# Patient Record
Sex: Female | Born: 1940
Health system: Southern US, Community
[De-identification: ages and names within clinical notes are randomized; demographics above are authoritative.]

## PROBLEM LIST (undated history)

## (undated) DIAGNOSIS — I499 Cardiac arrhythmia, unspecified: Secondary | ICD-10-CM

## (undated) DIAGNOSIS — E785 Hyperlipidemia, unspecified: Secondary | ICD-10-CM

## (undated) DIAGNOSIS — R112 Nausea with vomiting, unspecified: Secondary | ICD-10-CM

## (undated) DIAGNOSIS — Z7901 Long term (current) use of anticoagulants: Secondary | ICD-10-CM

## (undated) DIAGNOSIS — Z950 Presence of cardiac pacemaker: Secondary | ICD-10-CM

## (undated) DIAGNOSIS — R0602 Shortness of breath: Secondary | ICD-10-CM

## (undated) DIAGNOSIS — H269 Unspecified cataract: Secondary | ICD-10-CM

## (undated) DIAGNOSIS — I484 Atypical atrial flutter: Secondary | ICD-10-CM

## (undated) DIAGNOSIS — I5032 Chronic diastolic (congestive) heart failure: Secondary | ICD-10-CM

## (undated) DIAGNOSIS — I10 Essential (primary) hypertension: Secondary | ICD-10-CM

## (undated) DIAGNOSIS — S62109A Fracture of unspecified carpal bone, unspecified wrist, initial encounter for closed fracture: Secondary | ICD-10-CM

## (undated) DIAGNOSIS — I471 Supraventricular tachycardia: Secondary | ICD-10-CM

## (undated) DIAGNOSIS — G20 Parkinson's disease: Secondary | ICD-10-CM

## (undated) DIAGNOSIS — D649 Anemia, unspecified: Secondary | ICD-10-CM

## (undated) DIAGNOSIS — K219 Gastro-esophageal reflux disease without esophagitis: Secondary | ICD-10-CM

## (undated) DIAGNOSIS — Z9889 Other specified postprocedural states: Secondary | ICD-10-CM

## (undated) DIAGNOSIS — I4819 Other persistent atrial fibrillation: Secondary | ICD-10-CM

## (undated) DIAGNOSIS — M810 Age-related osteoporosis without current pathological fracture: Secondary | ICD-10-CM

## (undated) DIAGNOSIS — I4719 Other supraventricular tachycardia: Secondary | ICD-10-CM

## (undated) DIAGNOSIS — M1990 Unspecified osteoarthritis, unspecified site: Secondary | ICD-10-CM

## (undated) DIAGNOSIS — E039 Hypothyroidism, unspecified: Secondary | ICD-10-CM

## (undated) DIAGNOSIS — I495 Sick sinus syndrome: Secondary | ICD-10-CM

## (undated) DIAGNOSIS — R011 Cardiac murmur, unspecified: Secondary | ICD-10-CM

## (undated) DIAGNOSIS — S8290XA Unspecified fracture of unspecified lower leg, initial encounter for closed fracture: Secondary | ICD-10-CM

## (undated) DIAGNOSIS — IMO0002 Reserved for concepts with insufficient information to code with codable children: Secondary | ICD-10-CM

## (undated) HISTORY — DX: Long term (current) use of anticoagulants: Z79.01

## (undated) HISTORY — DX: Age-related osteoporosis without current pathological fracture: M81.0

## (undated) HISTORY — DX: Fracture of unspecified carpal bone, unspecified wrist, initial encounter for closed fracture: S62.109A

## (undated) HISTORY — PX: CARDIAC ELECTROPHYSIOLOGY STUDY AND ABLATION: SHX1294

## (undated) HISTORY — PX: TONSILLECTOMY: SUR1361

## (undated) HISTORY — DX: Parkinson's disease: G20

## (undated) HISTORY — PX: WRIST SURGERY: SHX841

## (undated) HISTORY — DX: Sick sinus syndrome: I49.5

## (undated) HISTORY — DX: Hyperlipidemia, unspecified: E78.5

## (undated) HISTORY — DX: Chronic diastolic (congestive) heart failure: I50.32

## (undated) HISTORY — DX: Other persistent atrial fibrillation: I48.19

## (undated) HISTORY — PX: SKIN CANCER DESTRUCTION: SHX778

## (undated) HISTORY — DX: Atypical atrial flutter: I48.4

## (undated) HISTORY — PX: BREAST SURGERY: SHX581

## (undated) HISTORY — DX: Unspecified fracture of unspecified lower leg, initial encounter for closed fracture: S82.90XA

## (undated) HISTORY — DX: Other supraventricular tachycardia: I47.19

## (undated) HISTORY — DX: Supraventricular tachycardia: I47.1

## (undated) HISTORY — DX: Essential (primary) hypertension: I10

## (undated) HISTORY — PX: OTHER SURGICAL HISTORY: SHX169

---

## 1993-02-03 HISTORY — PX: CHOLECYSTECTOMY: SHX55

## 1996-02-04 HISTORY — PX: BREAST BIOPSY: SHX20

## 1998-11-30 ENCOUNTER — Encounter: Admission: RE | Admit: 1998-11-30 | Discharge: 1998-11-30 | Payer: Self-pay | Admitting: Obstetrics and Gynecology

## 1998-11-30 ENCOUNTER — Encounter: Payer: Self-pay | Admitting: Obstetrics and Gynecology

## 1999-02-04 HISTORY — PX: KNEE SURGERY: SHX244

## 1999-08-12 ENCOUNTER — Encounter: Admission: RE | Admit: 1999-08-12 | Discharge: 1999-08-12 | Payer: Self-pay | Admitting: Obstetrics and Gynecology

## 1999-08-12 ENCOUNTER — Encounter: Payer: Self-pay | Admitting: Obstetrics and Gynecology

## 2000-06-09 ENCOUNTER — Ambulatory Visit (HOSPITAL_COMMUNITY): Admission: RE | Admit: 2000-06-09 | Discharge: 2000-06-09 | Payer: Self-pay | Admitting: Obstetrics and Gynecology

## 2000-06-17 ENCOUNTER — Ambulatory Visit (HOSPITAL_COMMUNITY): Admission: RE | Admit: 2000-06-17 | Discharge: 2000-06-17 | Payer: Self-pay | Admitting: Gastroenterology

## 2000-07-30 ENCOUNTER — Encounter: Payer: Self-pay | Admitting: Obstetrics and Gynecology

## 2000-07-30 ENCOUNTER — Encounter: Admission: RE | Admit: 2000-07-30 | Discharge: 2000-07-30 | Payer: Self-pay | Admitting: Obstetrics and Gynecology

## 2001-07-27 ENCOUNTER — Encounter: Admission: RE | Admit: 2001-07-27 | Discharge: 2001-07-27 | Payer: Self-pay | Admitting: Obstetrics and Gynecology

## 2001-07-27 ENCOUNTER — Encounter: Payer: Self-pay | Admitting: Obstetrics and Gynecology

## 2001-10-19 ENCOUNTER — Other Ambulatory Visit: Admission: RE | Admit: 2001-10-19 | Discharge: 2001-10-19 | Payer: Self-pay | Admitting: Obstetrics and Gynecology

## 2001-11-08 ENCOUNTER — Encounter: Payer: Self-pay | Admitting: Obstetrics and Gynecology

## 2001-11-08 ENCOUNTER — Encounter: Admission: RE | Admit: 2001-11-08 | Discharge: 2001-11-08 | Payer: Self-pay | Admitting: Physical Therapy

## 2001-12-15 ENCOUNTER — Ambulatory Visit (HOSPITAL_COMMUNITY): Admission: RE | Admit: 2001-12-15 | Discharge: 2001-12-15 | Payer: Self-pay | Admitting: Internal Medicine

## 2002-08-11 ENCOUNTER — Encounter: Admission: RE | Admit: 2002-08-11 | Discharge: 2002-08-11 | Payer: Self-pay | Admitting: Obstetrics and Gynecology

## 2002-08-11 ENCOUNTER — Encounter: Payer: Self-pay | Admitting: Obstetrics and Gynecology

## 2002-10-25 ENCOUNTER — Other Ambulatory Visit: Admission: RE | Admit: 2002-10-25 | Discharge: 2002-10-25 | Payer: Self-pay | Admitting: Obstetrics and Gynecology

## 2002-12-19 ENCOUNTER — Encounter: Admission: RE | Admit: 2002-12-19 | Discharge: 2002-12-19 | Payer: Self-pay | Admitting: Internal Medicine

## 2003-08-15 ENCOUNTER — Encounter: Admission: RE | Admit: 2003-08-15 | Discharge: 2003-08-15 | Payer: Self-pay | Admitting: Internal Medicine

## 2003-12-06 ENCOUNTER — Other Ambulatory Visit: Admission: RE | Admit: 2003-12-06 | Discharge: 2003-12-06 | Payer: Self-pay | Admitting: Obstetrics and Gynecology

## 2004-03-05 ENCOUNTER — Inpatient Hospital Stay (HOSPITAL_COMMUNITY): Admission: AD | Admit: 2004-03-05 | Discharge: 2004-03-07 | Payer: Self-pay | Admitting: Internal Medicine

## 2004-04-26 ENCOUNTER — Ambulatory Visit (HOSPITAL_COMMUNITY): Admission: RE | Admit: 2004-04-26 | Discharge: 2004-04-26 | Payer: Self-pay | Admitting: Cardiovascular Disease

## 2004-05-08 ENCOUNTER — Emergency Department (HOSPITAL_COMMUNITY): Admission: EM | Admit: 2004-05-08 | Discharge: 2004-05-08 | Payer: Self-pay | Admitting: Emergency Medicine

## 2004-05-10 ENCOUNTER — Ambulatory Visit (HOSPITAL_COMMUNITY): Admission: RE | Admit: 2004-05-10 | Discharge: 2004-05-10 | Payer: Self-pay | Admitting: Cardiovascular Disease

## 2004-09-02 ENCOUNTER — Encounter: Admission: RE | Admit: 2004-09-02 | Discharge: 2004-09-02 | Payer: Self-pay | Admitting: Obstetrics and Gynecology

## 2004-12-16 ENCOUNTER — Other Ambulatory Visit: Admission: RE | Admit: 2004-12-16 | Discharge: 2004-12-16 | Payer: Self-pay | Admitting: Obstetrics and Gynecology

## 2005-06-01 ENCOUNTER — Encounter: Admission: RE | Admit: 2005-06-01 | Discharge: 2005-06-01 | Payer: Self-pay | Admitting: Orthopedic Surgery

## 2005-06-03 ENCOUNTER — Encounter: Admission: RE | Admit: 2005-06-03 | Discharge: 2005-06-03 | Payer: Self-pay | Admitting: Orthopedic Surgery

## 2005-09-24 ENCOUNTER — Encounter: Admission: RE | Admit: 2005-09-24 | Discharge: 2005-09-24 | Payer: Self-pay | Admitting: Obstetrics and Gynecology

## 2006-01-06 ENCOUNTER — Encounter: Admission: RE | Admit: 2006-01-06 | Discharge: 2006-01-06 | Payer: Self-pay | Admitting: Obstetrics and Gynecology

## 2006-02-23 ENCOUNTER — Other Ambulatory Visit: Admission: RE | Admit: 2006-02-23 | Discharge: 2006-02-23 | Payer: Self-pay | Admitting: Obstetrics and Gynecology

## 2006-10-08 ENCOUNTER — Encounter: Admission: RE | Admit: 2006-10-08 | Discharge: 2006-10-08 | Payer: Self-pay | Admitting: Obstetrics and Gynecology

## 2007-03-17 ENCOUNTER — Other Ambulatory Visit: Admission: RE | Admit: 2007-03-17 | Discharge: 2007-03-17 | Payer: Self-pay | Admitting: Obstetrics and Gynecology

## 2007-05-20 ENCOUNTER — Inpatient Hospital Stay (HOSPITAL_COMMUNITY): Admission: EM | Admit: 2007-05-20 | Discharge: 2007-05-21 | Payer: Self-pay | Admitting: Emergency Medicine

## 2007-05-21 ENCOUNTER — Encounter (INDEPENDENT_AMBULATORY_CARE_PROVIDER_SITE_OTHER): Payer: Self-pay | Admitting: Neurology

## 2007-06-04 LAB — HM COLONOSCOPY: HM Colonoscopy: NORMAL

## 2007-07-05 DIAGNOSIS — M810 Age-related osteoporosis without current pathological fracture: Secondary | ICD-10-CM

## 2007-07-05 HISTORY — DX: Age-related osteoporosis without current pathological fracture: M81.0

## 2007-09-02 ENCOUNTER — Encounter: Admission: RE | Admit: 2007-09-02 | Discharge: 2007-09-02 | Payer: Self-pay | Admitting: Orthopedic Surgery

## 2007-10-12 ENCOUNTER — Encounter: Admission: RE | Admit: 2007-10-12 | Discharge: 2007-10-12 | Payer: Self-pay | Admitting: Obstetrics and Gynecology

## 2008-02-04 HISTORY — PX: FOREARM SURGERY: SHX651

## 2008-03-10 HISTORY — PX: US ECHOCARDIOGRAPHY: HXRAD669

## 2008-04-18 ENCOUNTER — Other Ambulatory Visit: Admission: RE | Admit: 2008-04-18 | Discharge: 2008-04-18 | Payer: Self-pay | Admitting: Obstetrics and Gynecology

## 2008-05-22 ENCOUNTER — Ambulatory Visit (HOSPITAL_COMMUNITY): Admission: RE | Admit: 2008-05-22 | Discharge: 2008-05-22 | Payer: Self-pay | Admitting: Cardiovascular Disease

## 2008-06-06 ENCOUNTER — Encounter: Payer: Self-pay | Admitting: Cardiovascular Disease

## 2008-06-06 HISTORY — PX: CARDIOVASCULAR STRESS TEST: SHX262

## 2008-06-20 ENCOUNTER — Encounter: Admission: RE | Admit: 2008-06-20 | Discharge: 2008-06-20 | Payer: Self-pay | Admitting: Internal Medicine

## 2008-07-05 ENCOUNTER — Encounter: Admission: RE | Admit: 2008-07-05 | Discharge: 2008-07-05 | Payer: Self-pay | Admitting: Internal Medicine

## 2008-08-30 ENCOUNTER — Ambulatory Visit (HOSPITAL_COMMUNITY): Admission: RE | Admit: 2008-08-30 | Discharge: 2008-08-31 | Payer: Self-pay | Admitting: Orthopedic Surgery

## 2008-10-17 ENCOUNTER — Encounter: Admission: RE | Admit: 2008-10-17 | Discharge: 2008-10-17 | Payer: Self-pay | Admitting: Obstetrics and Gynecology

## 2008-11-01 ENCOUNTER — Ambulatory Visit (HOSPITAL_COMMUNITY): Admission: RE | Admit: 2008-11-01 | Discharge: 2008-11-01 | Payer: Self-pay | Admitting: Internal Medicine

## 2008-11-08 ENCOUNTER — Ambulatory Visit (HOSPITAL_COMMUNITY): Admission: RE | Admit: 2008-11-08 | Discharge: 2008-11-08 | Payer: Self-pay | Admitting: Cardiovascular Disease

## 2008-11-15 ENCOUNTER — Ambulatory Visit (HOSPITAL_COMMUNITY): Admission: RE | Admit: 2008-11-15 | Discharge: 2008-11-15 | Payer: Self-pay | Admitting: Internal Medicine

## 2009-02-03 DIAGNOSIS — G20 Parkinson's disease: Secondary | ICD-10-CM

## 2009-02-03 DIAGNOSIS — G20A1 Parkinson's disease without dyskinesia, without mention of fluctuations: Secondary | ICD-10-CM

## 2009-02-03 HISTORY — DX: Parkinson's disease without dyskinesia, without mention of fluctuations: G20.A1

## 2009-02-03 HISTORY — PX: FEMUR FRACTURE SURGERY: SHX633

## 2009-02-03 HISTORY — DX: Parkinson's disease: G20

## 2009-04-25 LAB — HM PAP SMEAR: HM Pap smear: NEGATIVE

## 2009-10-22 ENCOUNTER — Encounter: Admission: RE | Admit: 2009-10-22 | Discharge: 2009-10-22 | Payer: Self-pay | Admitting: Obstetrics and Gynecology

## 2010-01-23 ENCOUNTER — Ambulatory Visit: Payer: Self-pay | Admitting: Cardiovascular Disease

## 2010-01-23 DIAGNOSIS — S8290XA Unspecified fracture of unspecified lower leg, initial encounter for closed fracture: Secondary | ICD-10-CM

## 2010-01-23 HISTORY — DX: Unspecified fracture of unspecified lower leg, initial encounter for closed fracture: S82.90XA

## 2010-05-09 LAB — PROTIME-INR: Prothrombin Time: 37 seconds — ABNORMAL HIGH (ref 11.6–15.2)

## 2010-05-12 LAB — BASIC METABOLIC PANEL WITH GFR
BUN: 17 mg/dL (ref 6–23)
CO2: 27 meq/L (ref 19–32)
Calcium: 8.6 mg/dL (ref 8.4–10.5)
Chloride: 101 meq/L (ref 96–112)
Creatinine, Ser: 0.71 mg/dL (ref 0.4–1.2)
GFR calc Af Amer: >60 mL/min (ref >60)
GFR calc non Af Amer: >60 mL/min (ref >60)
Glucose, Bld: 119 mg/dL — ABNORMAL HIGH (ref 70–99)
Potassium: 4 meq/L (ref 3.5–5.1)
Sodium: 135 meq/L (ref 135–145)

## 2010-05-12 LAB — CBC
MCHC: 34.3 g/dL (ref 30.0–36.0)
MCV: 90.8 fL (ref 78.0–100.0)
RBC: 3.48 MIL/uL — ABNORMAL LOW (ref 3.87–5.11)
WBC: 6.3 10*3/uL (ref 4.0–10.5)

## 2010-05-12 LAB — APTT: aPTT: 51 seconds — ABNORMAL HIGH (ref 24–37)

## 2010-05-12 LAB — PROTIME-INR
INR: 3.2 — ABNORMAL HIGH (ref 0.00–1.49)
Prothrombin Time: 35.7 seconds — ABNORMAL HIGH (ref 11.6–15.2)

## 2010-06-18 NOTE — H&P (Signed)
NAME:  [name] [name], [name] [name]             ACCOUNT NO.:  [id number]   MEDICAL RECORD NO.:  [id number]            PATIENT TYPE:   LOCATION:                                 FACILITY:   PHYSICIAN:  Vesta Mixer, M.D.      DATE OF BIRTH:   DATE OF ADMISSION:  DATE OF DISCHARGE:                              HISTORY & PHYSICAL   [name] [name] is an elderly female with a history of atrial fibrillation,  hypertension, and hyperlipidemia.  She is admitted now for elective  cardioversion for atrial fibrillation.   [name] [name] is an elderly female with a history of intermittent atrial  fibrillation.  She has been cardioverted in the past.  She now presents  with several months of persistent cardioversion.  She has been on  Coumadin for the past several weeks and has been therapeutic.  She  notices shortness of breath especially with exertion since she has been  in atrial fibrillation.  She has not had any syncope or chest pain.  She  denies any PND or orthopnea.  She just notices some dyspnea on exertion.   She denies any cough or cold symptoms.   CURRENT MEDICATIONS:  1. Multivitamin once a day.  2. Cozaar 25 mg a day.  3. Toprol-XL 50 mg a day.  4. Calcium twice a day.  5. Magnesium twice a day.  6. Selenium twice a day.  7. Lutein once a day.  8. Aspirin 81 mg a day.  9. Coumadin 5 mg 3 days a week, 2.5 mg 4 days a week.  10.Lipitor 5 mg a day.  11.Rozerem as needed.  12.Actonel once a month.   ALLERGIES:  None.   PAST MEDICAL HISTORY:  1. Atrial fibrillation.  2. Hypertension.  3. Hyperlipidemia.   SOCIAL HISTORY:  The patient is a nonsmoker.  She drinks alcohol  occasionally.  She walks several times a week.   FAMILY HISTORY:  Her father died at age 45 due to complications  involving rheumatic fever.  Her mother died at age 54 due to Alzheimer  disease.  Her review of systems is reviewed in the HPI.  All other  systems were reviewed and are negative.   On exam, she is an  elderly female in no acute distress.  She is alert  and oriented x3.  Her mood and affect are normal.  Her weight is 150,  blood pressure is 100/70 with a heart rate of 72.  HEENT exam reveals 2+  carotids.  She has no bruits, no JVD.  Her neck is supple.  Lungs are  clear.  Her back is nontender.  Her heart is irregularly irregular.  Her  PMI is nondisplaced.  She has a soft systolic murmur.  Her chest wall is  nontender.  Her abdominal exam reveals good bowel sounds.  She has no  hepatosplenomegaly.  She has no masses or bruits.  Her extremities show  no clubbing, cyanosis, or edema.  Her skin is warm and dry.  Her gait is  normal.  Neuro exam reveals cranial nerves II through XII are intact  and  her motor and sensory functions are intact.   Her EKG from last month reveals atrial fibrillation with a controlled  ventricular response.   I had a long discussion with [name] regarding cardioversion.  At this  point, I do not think that we need to consider antiarrhythmic.  Let us  admit her to the hospital for elective cardioversion.  We can consider  doing a stress test to make sure that she does not have any ischemia and  we can consider flecainide.  I have told her that all of the  antiarrhythmics would potentially have side effects but none of them  worked all that well.  We also discussed the possibility of that she  could enter one of the [employer] studies for a new  antiarrhythmic drug for atrial fibrillation.  We will talk more about  that at our next meeting.  We have discussed the risks, benefits, and  options of cardioversion.  She understands and agrees to proceed.      Vesta Mixer, M.D.  Electronically Signed     PJN/MEDQ  D:  04/10/2008  T:  04/11/2008  Job:  [id number]   cc:   Loraine Leriche A. [name], M.D.

## 2010-06-18 NOTE — Consult Note (Signed)
NAME:  Kingdon, Nishi             ACCOUNT NO.:  [id number]   MEDICAL RECORD NO.:  [id number]          PATIENT TYPE:  INP   LOCATION:  1828                         FACILITY:  [facility]   PHYSICIAN:  Pramod P. Pearlean Brownie, MD    DATE OF BIRTH:  05/30/1940   DATE OF CONSULTATION:  05/20/2007  DATE OF DISCHARGE:                                 CONSULTATION   REFERRING PHYSICIAN:  Tera Mater. Evlyn Kanner, M.D.   REASON FOR REFERRAL:  TIA.   HISTORY OF PRESENT ILLNESS:  [name] is a 71 year old Caucasian  lady, who developed some confusion and right-sided weakness this  morning.  The patient is unable to give a clear history.  She states she  woke up in the morning and seemed like she was okay, but when her  daughter called her at 9:15 and spoke to her, the daughter states that  she found her mother to be still quite sleepy and groggy, slow to  process information, and the mother told her that she was still wearing  a nightgown, and she could not stand straight, she was leaning on the  right.  The patient told me that she noticed that she had some trouble  walking and placing her right leg.  She denies any headache or slurred  speech.  She did not fall down.  She has recovered completely after she  has come to the emergency room.  She has no known prior history of  stroke, TIAs, or seizures.  She has a history of paroxysmal atrial  fibrillation, but has been on aspirin, and she had undergone  cardioversion in the past and was on Coumadin prior to cardioversion.  She does complain of some palpitation off and on, but has not been known  to be in documented atrial fibrillation again.   PAST MEDICAL HISTORY:  Significant for,  1. Benign essential tremor for which she sees Dr. Sandria Manly.  2. Also, history of gastroesophageal reflux disease.  3. Hyperlipidemia.  4. Hypertension.  5. Osteopenia.   HOME MEDICATIONS:  1. Fosamax.  2. Vitamins.  3. Glucosamine.  4. Ibuprofen.  5. Nexium.  6. Cozaar.  7. Multivitamin.  8. Lutein.  9. Fish oil.  10.Ambien.   MEDICATIONS TO ALLERGIES:  None known.   FAMILY HISTORY:  Positive for Alzheimer's in mother.   SOCIAL HISTORY:  She lives alone, does not smoke.  She has 1-2 drinks  every day.  She is retired.   REVIEW OF SYSTEMS:  Negative for chest pain, fever, cough, shortness of  breath, diarrhea, or other illness.   PHYSICAL EXAMINATION:  GENERAL:  Physical exam reveals a pleasant,  elderly, Caucasian lady not in distress, afebrile.  VITAL SIGNS:  Pulse rate is at 78 per minute and regular, some ectopic  beats are noted.  Blood pressure is 122/76, temperature is 97.8,  respiratory rate is 16 per minute, and sats are 97% on room air.  ENT:  Unremarkable.  No carotid bruits are heard.  LUNGS:  Clear to auscultation.  ABDOMEN:  Soft and nontender.  CARDIAC:  Regular heart sounds.  No murmur.  NECK:  Supple.  There is no bruit.  NEUROLOGICAL:  She is pleasant, awake, alert, and cooperative.  There is  no aphasia, apraxia, or dysarthria.  Pupils are equally reactive.  Eye  movements are full range, there is no nystagmus.  Face is symmetric.  Palatal movements are normal.  Tongue is midline.  Motor system exam  reveals normal upper extremity grip, symmetric strength, tone, reflexes,  coordination, and sensation.  She has slightly diminished fine finger  movements in the right.  She orbits the left or right upper extremity.  There is mild weakness of right grip compared to the left.  Coordination  is intact.  Sensation is normal.  Plantars are downgoing.   LABORATORY DATA:  Data reviewed, noncontrast CAT scan of the head done  today reveals no acute abnormality.  CBC, basic metabolic panel labs,  and UA are all normal.   IMPRESSION:  This is a 70 year old lady with transient episode of  confusion and right-sided weakness, likely due to small left hemispheric  subcortical infarct or transient ischemic attack.  She has history of   paroxysmal atrial fibrillation, and hence, cardioembolic etiology is  quite likely.   PLAN:  The patient will likely need anticoagulation with Coumadin if it  is okay with cardiologist, Dr. Elease Hashimoto.  Check MRI scan of the brain with  MRA of the brain, carotid ultrasound, fasting lipid profile, hemoglobin  A1c, and homocysteine.  Telemetric monitoring for atrial fibrillation.  I had a long discussion with the patient and her daughter regarding her  symptoms, discussed plan for evaluation and treatment, and answered  questions.  Kindly call for questions.           ______________________________  Sunny Schlein. Pearlean Brownie, MD     PPS/MEDQ  D:  05/20/2007  T:  05/21/2007  Job:  [id number]

## 2010-06-18 NOTE — H&P (Signed)
NAME:  [name] [name], [name] [name]             ACCOUNT NO.:  [id number]   MEDICAL RECORD NO.:  [id number]          PATIENT TYPE:  OIB   LOCATION:                               FACILITY:  [facility]   PHYSICIAN:  Vesta Mixer, M.D. DATE OF BIRTH:  1940-04-03   DATE OF ADMISSION:  05/22/2008  DATE OF DISCHARGE:                              HISTORY & PHYSICAL   [name] [name] is an elderly female with a history of hypertension,  atrial fibrillation, hyperlipidemia.  She is admitted now for elective  cardioversion.   [name] [name] was originally scheduled for admission on April 10, 2008.  It was  found at that time that her INR was subtherapeutic and cancelled the  admission.  She has been therapeutic on her Coumadin for the past month,  and she is now rescheduled for repeat attempt at cardioversion.  Please  see the dictated H and P from April 10, 2008.   [name] [name] is an elderly female with a history of intermittent atrial  fibrillation.  She has been cardioverted in the past.  She has had  persistent atrial fibrillation.  She had been on Coumadin and it has  been therapeutic.  She has had some shortness of breath with exertion,  but denies any PND, orthopnea.  She denies any chest pain or syncope or  presyncope.  She denies any heat or cold tolerance, weight gain or  weight loss.  All other systems were reviewed and are negative.   CURRENT MEDICATIONS:  1. Multivitamins once a day.  2. Cozaar 25 mg a day.  3. Toprol-XL 50 mg a day.  4. Calcium twice a day.  5. Magnesium twice a day.  6. Selenium once a day.  7. Lutein once a day.  8. Aspirin 81 mg a day.  9. Coumadin 5 mg 4 day a week and 2.5 mg 3 days a week.  10.Lipitor 5 mg a day.  11.Actonel once a month.   ALLERGIES:  None.   PAST MEDICAL HISTORY:  1. Atrial fibrillation.  2. Hypertension.  3. Heart murmurs.   SOCIAL HISTORY:  The patient is a nonsmoker.  She drinks alcohol  occasionally.   FAMILY HISTORY:  Her father died at age 44 due to  complications  including rheumatic fever.  Her mother died at age 102 due to Alzheimer  disease.   REVIEW OF SYSTEMS:  Review of systems is reviewed in the HPI.  All other  systems are negative.   PHYSICAL EXAMINATION:  GENERAL:  She is an elderly female in no acute  distress.  She is alert and oriented x3.  Mood and affect are normal.  VITAL SIGNS:  Weight is 148, blood pressure is 122/70 with a heart rate  of 72.  HEENT:  Carotids 2+.  She has no bruits.  Her sclerae are nonicteric.  Her mucous membranes are moist.  There is no JVD.  NECK:  Supple.  LUNGS:  Clear.  BACK:  Nontender.  HEART:  Irregularly irregular.  She has a soft systolic murmur.  ABDOMINAL:  Good bowel sounds.  There is no hepatosplenomegaly, no  guarding,  or rebound.  EXTREMITIES:  She has no clubbing, cyanosis, or edema.  SKIN:  Warm and dry without rashes.  Gait is normal.  NEURO:  Cranial nerves II through XII are intact and motor and sensory  functions are intact.   Her EKG reveals atrial fibrillation with a controlled ventricular  response.   LABORATORY DATA:  Acceptable.  Her INR has been above 2.0 since April 12, 2008.   [name] [name] presents now for elective cardioversion.  We have discussed the  risks, benefits, and options of cardioversion.  She understands and  agrees to proceed.      Vesta Mixer, M.D.  Electronically Signed     PJN/MEDQ  D:  05/16/2008  T:  05/17/2008  Job:  [id number]   cc:   Loraine Leriche A. [name], M.D.

## 2010-06-18 NOTE — Consult Note (Signed)
NAME:  [name] [name], [name] [name]             ACCOUNT NO.:  [id number]   MEDICAL RECORD NO.:  [id number]          PATIENT TYPE:  INP   LOCATION:  3040                         FACILITY:  [facility]   PHYSICIAN:  Peter M. [place], M.D.  DATE OF BIRTH:  September 23, 1940   DATE OF CONSULTATION:  DATE OF DISCHARGE:                                 CONSULTATION   HISTORY OF PRESENT ILLNESS:  Ms. Sprinkles is a very pleasant 70-year-  old white female with history of recurrent atrial fibrillation.  She has  had 2 prior cardioversions the last 3 to 4 years ago.  She has had  intermittent tachy palpitations, which have been ascribed to PACs, and  there has not been any documented recurrent atrial fibrillation.  She  does have a history of hypertension.  Today, she presented with slowness  of speech, jumbled thinking and an unsteady gait.  It is felt that these  symptoms are consistent with TIA.  She did have an echocardiogram 2  months ago without acute findings, and she currently feels fine.   PAST MEDICAL HISTORY:  1. Hypertension.  2. Atrial fibrillation.  3. Osteoporosis.  4. History of colon polyps.  5. Status post left knee surgery.  6. Vitamin D deficiency.  7. Status post cholecystectomy.   MEDICATIONS:  Include:  1. Multivitamin daily.  2. Nexium 40 mg per day.  3. Cozaar 25 mg per day.  4. Toprol XL 50 mg per day.  5. Calcium and magnesium supplements daily.  6. Lutein daily.  7. Aspirin 81 mg per day.  8. Lipitor 5 mg per day.   SOCIAL HISTORY:  The patient is nonsmoker, nondrinker.   FAMILY HISTORY:  Father died age 44 of rheumatic heart disease.  Mother  died age 5 with Alzheimer's disease.   REVIEW OF SYSTEMS:  Otherwise negative.   PHYSICAL EXAMINATION:  GENERAL:  Patient is a pleasant white female in  no distress.  VITAL SIGNS:  Blood pressure 138/79, pulse 62 and regular.  She is  afebrile.  HEENT:  Exam is unremarkable.  She has no bruits or JVD.  LUNGS:  Clear.  CARDIAC:  Exam  reveals irregular rate and rhythm without gallop, murmur  or click.  ABDOMEN:  Soft, nontender.  She has no edema.   LABORATORY DATA:  CT of the head shows no acute abnormality.  White  count is 4,600, hemoglobin 12.7, hematocrit 37.3, platelets 207,000.  UA  is negative, sodium is 138, potassium 4.3, chloride 105, CO2 20, BUN 20,  creatinine 1.1, glucose of 106.   IMPRESSION:  1. Transient ischemic attack.  2. History of atrial fibrillation, currently in sinus rhythm.  3. Hypertension.  4. Status post cholecystectomy.   PLAN:  Given her risk factors of female sex, history of atrial  fibrillation and history of hypertension, I think she merits long-term  anticoagulation with Coumadin, would continue on her Cozaar and Toprol.  I would not repeat her echocardiogram at this time, since she just had  one 2 months ago.  She is scheduled for a carotid ultrasound and MRI.  Please let us know if  we can be of further assistance.           ______________________________  Peter M. [place], M.D.     PMJ/MEDQ  D:  05/20/2007  T:  05/20/2007  Job:  [id number]   cc:   Jeannett Senior A. Evlyn Kanner, M.D.  Vesta Mixer, M.D.  Pramod P. Pearlean Brownie, MD

## 2010-06-18 NOTE — Op Note (Signed)
NAME:  [name] [name], [name] [name]             ACCOUNT NO.:  [id number]   MEDICAL RECORD NO.:  [id number]          PATIENT TYPE:  OIB   LOCATION:  5039                         FACILITY:  [facility]   PHYSICIAN:  Madelynn Done, MD  DATE OF BIRTH:  09-22-1940   DATE OF PROCEDURE:  08/30/2008  DATE OF DISCHARGE:                               OPERATIVE REPORT   PREOPERATIVE DIAGNOSES:  1. Left forearm retained hematomas, left forearm laterally and left      forearm medially.  2. Anticoagulation therapy.   POSTOPERATIVE DIAGNOSES:  1. Left forearm retained hematomas, left forearm laterally and left      forearm medially.  2. Anticoagulation therapy.   ATTENDING PHYSICIAN:  Madelynn Done, MD who scrubbed and present for  the entire procedure.   SURGICAL PROCEDURES:  1. Left forearm hematoma evacuation, lateral.  2. Left forearm hematoma evacuation, medially.   ANESTHESIA:  General via LMA.   DRAINS:  One TLS #7 drain and one vessel loop, TLS drain laterally and  vessel loop medially.   SURGICAL INDICATIONS:  Ms. Labarre is a 70 year old right-hand-dominant  female who was on anticoagulation therapy sustained a crushing injury to  her left forearm.  The patient had worsening swelling in her left  forearm.  She presented to the office on August 30, 2008, with a large  hematomas and a very swollen and edematous hand with concern for skin  compromise on the lateral aspect of her forearm.  It was recommended  that she undergo the above procedure.  Risks, benefits, and alternatives  have discussed in detail with the patient and signed informed consent  was obtained.   Risks include but not limited to bleeding, infection, damage nearby  nerves, arteries or tendons, persistent bleeding, infection, need for  further surgical intervention, skin loss and loss of motion of the wrist  and forearm and elbow.   DESCRIPTION OF PROCEDURE:  The patient was appropriately identified in  the preoperative  holding area and marked with a permanent marker made on  left forearm to indicate correct operative site.  The patient was  brought back to the operating room, placed supine on the anesthesia room  table.  General anesthesia was administered.  The patient tolerated this  well.  A well-padded tourniquet was then placed on the left brachium and  sealed with a 1000 drape.  The left upper extremity was prepped  Hibiclens and sterilely draped.  Time-out was called, the correct site  was identified, and procedure was then begun.  Attention was then turned  to the left forearm.  The longitudinal incision then made just radial to  the hematoma on the dorsal radial aspect of the forearm.  Several  centimeter incision was then made.  Dissection was carried down through  the skin, subcutaneous tissue where a large amount of hematoma was then  evacuated.  Using the suction and blunt dissection, the hematoma was  evacuated.  It did not extend deep to the fascia.  Copious irrigation  was then used to thoroughly irrigate the hematoma out.  Attention was  then turned medially  where another incision was made directly over the  hematoma.  Dissection was carried down through the skin and subcutaneous  tissues and a large hematoma was evacuated from the medial forearm.  The  pulsatile lavage was then used to run through both incisions.  These  were 2 separate incision, they did not connect.  The pulsatile lavage  was then thoroughly irrigated to evacuate any retained hematoma  fragment.  Following thorough irrigation and evacuation, the lateral  incision was then closed over #7 TLS drain with 3-0 nylon suture.  The  medial incision was then closed over a vessel loop with 3-0 nylon  suture.  Adaptic dressing was then applied.  Prior to closure of the  wounds, thrombin spray was then injected over the area to aid in  hemostasis.  There was no significant arterial venous bleeding.  The  patient just had a slow  ooze from the anticoagulation therapy.  Following this, 10 mL of 0.25% Marcaine were then infiltrated locally.  Adaptic dressing and sterile compressive bandage was then applied.  The  patient was then extubated and taken to recovery room in good condition.   POSTOPERATIVE PLAN:  The patient is going to be admitted overnight for  IV antibiotics and pain control.  We will change the dressing and  removed the drains tomorrow and likely then follow her as an outpatient.      Madelynn Done, MD  Electronically Signed     FWO/MEDQ  D:  08/30/2008  T:  08/31/2008  Job:  [id number]

## 2010-06-21 NOTE — H&P (Signed)
NAME:  Barren, Monzerrat             ACCOUNT NO.:  [id number]   MEDICAL RECORD NO.:  [id number]          PATIENT TYPE:  INP   LOCATION:  4705                         FACILITY:  [facility]   PHYSICIAN:  [name]. [name], M.D.   DATE OF BIRTH:  [birth year]   DATE OF PROCEDURE:  DATE OF DISCHARGE:                      STAT - MUST CHANGE TO CORRECT WORK TYPE   CHIEF COMPLAINT:  Fast heart rate and shortness of breath and decreased  energy.   HISTORY OF PRESENT ILLNESS:  Ms. Egge is a pleasant 70 year old female  with a past medical history significant for osteopenia, silent  gastroesophageal reflux, colon polyps, postmenopausal hormone status, and  mild left ventricular hypertrophy and some findings suggestive of diastolic  dysfunction by a November 2005 echocardiogram.  She is generally a very  healthy, fit lady who is very active.  However, she had a stomach virus  three weeks ago.  Since that time, she has felt short of breath, with poor  energy, and she has had periods of time of feeling her heart running fast.  She has had some presyncope type symptoms as well.  She denies any definite  chest pains, although she occasionally feels a slight catch in her chest  briefly.  She has had no nausea or vomiting or diarrhea since she resolved  her GI illness.  There has been no blood noted from above or below.  There  has been no edema or orthopnea or PND noted as well.  In the office, she was  found to be in atrial fibrillation, with rapid ventricular response, and  will be admitted for further management.   PAST MEDICAL HISTORY:  1.  Left knee surgery in 2001.  2.  History of cholecystectomy in 1994, and history of tonsillectomy and      adenoidectomy.  3.  No history of rheumatic fever.  4.  Left benign breast biopsy in 1996.  5.  Colon polyps.  6.  Uterine fibroids.  7.  Postmenopausal since age 32.  60.  G2 P2 parity status.  9.  Chronic mild constipation.  10. Seasonal allergic  rhinitis.  11. Hyperlipidemia.  12. Osteopenia.  13. Silent gastroesophageal reflux disease.  14. Mild LVH and findings suggestive of diastolic dysfunction as well as      aortic valve sclerosis without stenosis from a November 2005      echocardiogram.  At that time, ejection fraction was 60-65%, and there      was no report of any left atrial enlargement.   ALLERGIES:  SUDAFED causes palpitation and possible syncope.  No other  allergies.   MEDICATIONS:  1.  Fosamax 70 mg once a week.  2.  Vitamins, Centrum daily.  3.  Calcium and vitamin D daily and magnesium.  4.  Valtrex as needed.  5.  Claritin as needed.  6.  Rare ibuprofen.  7.  Nexium 40 mg daily.  8.  Cozaar 25 mg daily.   SOCIAL HISTORY:  Elita Quick is married since 80.  She has two children and five  grandchildren.  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  No tobacco history.  One to two glasses of wine per  day.  No drug use history.   FAMILY HISTORY:  Father died at age 19 of heart disease and rheumatic heart  problems.  Mother died at age 40 of Alzheimer's disease.  She had a maternal  grandfather with diabetes and a paternal grandmother who had MI and stroke  problems.  She is an only child.  Her two children are healthy.  She did  have a negative exercise Cardiolite test in November 2003.  In February  2005, she had normal carotid studies, normal ABIs, and no abdominal aortic  aneurysm by a [facility] screening set of tests.   PHYSICAL EXAMINATION:  WEIGHT:  140, which is stable.  BLOOD PRESSURE:  110/80.  PULSE:  126.  SATURATION:  98% on room air.  GENERAL:  She is in no acute distress.  LUNGS:  Clear to auscultation bilaterally.  NECK:  There are no carotid bruits.  HEART:  Tachycardic and slightly irregular, with no murmur noted.  There is  no peripheral edema.  ABDOMEN:  Soft and nontender.  NEUROLOGIC:  The patient is alert and oriented x 4 and grossly   neurologically intact.   LABORATORY DATA:  Pending.   EKG shows atrial fibrillation, with rapid ventricular response.  There is  some leftward axis noted.  No other hypertrophy noted.  Normal R wave  progression.  No ST or T wave changes are noted.   ASSESSMENT AND PLAN:  A 70 year old female with new atrial fibrillation and  rapid ventricular response.  She is at somewhat lower risk for complications  of atrial fibrillation due to her young age.  However, she does have some  evidence of LVH and diastolic dysfunction by her recent echocardiogram which  would put her at some slight increased risk of complications.  We will  therefore admit her and place her on a heparin drip and rate control her  with intravenous Cardizem.  We will obtain a cardiology consult to determine  need for Coumadin anticoagulation and possible timing of cardioversion.     MAP/MEDQ  D:  03/05/2004  T:  03/05/2004  Job:  [id number]   cc:   Vesta Mixer, M.D.  [address]  [place]  [place] [address]  Fax: [phone]

## 2010-06-21 NOTE — Discharge Summary (Signed)
NAME:  [name] [name], [name] [name]             ACCOUNT NO.:  [id number]   MEDICAL RECORD NO.:  [id number]          PATIENT TYPE:  INP   LOCATION:  4705                         FACILITY:  [facility]   PHYSICIAN:  Vesta Mixer, M.D. DATE OF BIRTH:  [birth year]   DATE OF ADMISSION:  03/05/2004  DATE OF DISCHARGE:  03/07/2004                                 DISCHARGE SUMMARY   DISCHARGE DIAGNOSIS:  Atrial fibrillation with rapid ventricular response.   DISCHARGE MEDICATIONS:  1.  Ambien 2.5/5 mg q.h.s. as needed.  2.  Digoxin0.25 mg a day.  3.  Toprol XL 50 mg a day.  4.  Coumadin 5 mg tablets.  She use to take 5 mg on Monday, Wednesday and      Friday, and 2.5 mg on the other days.  5.  Cozaar 25 mg a day.  6.  Fosamax 70 mg a week.  7.  Nexium 40 mg a day.   DISPOSITION:  The patient will see Dr. Elease Hashimoto in 1-2 weeks in the office.  She is to have a protime this Monday.   HISTORY OF PRESENT ILLNESS:  Ms. Jorgenson is a 70 year old female who is  admitted to the hospital with atrial fibrillation with rapid ventricular  response.  Please see dictated H&P for further details.   HOSPITAL COURSE:  The patient's rate was controlled on IV Cardizem.  She was  started on p.o. digoxin and Toprol and her Cardizem drip was gradually  weaned.  She is sent home in an improved condition.  We will monitor her  protimes at the office.  She may also wish to have protimes monitored by Dr.  Waynard Edwards.  We will see her back in the office in several weeks and will  arrange for DC cardioversion after she has been on Coumadin for  approximately 4 weeks.   Her TSH was drawn twice.  Both are within the normal range, although one was  6.  Will follow up with Dr. Waynard Edwards for repeat TSH in several months.      PJN/MEDQ  D:  03/07/2004  T:  03/07/2004  Job:  [id number]   cc:   Loraine Leriche A. Waynard Edwards, M.D.  [address]  [place]  [place] [address]  Fax: [phone]

## 2010-06-21 NOTE — H&P (Signed)
NAME:  [name] [name]             ACCOUNT NO.:  [id number]   MEDICAL RECORD NO.:  [id number]          PATIENT TYPE:  INP   LOCATION:  4705                         FACILITY:  [facility]   PHYSICIAN:  Vesta Mixer, M.D. DATE OF BIRTH:  Jul 25, 1940   DATE OF ADMISSION:  03/05/2004  DATE OF DISCHARGE:  03/07/2004                                HISTORY & PHYSICAL   REASON FOR ADMISSION:  [name] [name] is a middle-aged female with a history  of atrial fibrillation. She is admitted now for elective cardioversion.   HISTORY OF PRESENT ILLNESS:  [name] [name] was admitted to the hospital on March 05, 2004. She had atrial fibrillation with a rapid ventricular response. Her  rate was well controlled on Cardizem and she was started on Coumadin. She  has been followed as an outpatient and her pro times have been therapeutic.  She is now scheduled for a DC cardioversion.   She has had not had any episodes of chest pain or shortness of breath. She  has been able to do all of her normal activities without any significant  problems.   CURRENT MEDICATIONS:  1.  Fosamax once a week.  2.  Multivitamin once a week.  3.  Nexium 40 mg q.d.  4.  Cozaar 25 mg q.d.  5.  Digoxin 0.25 mg q.d.  6.  Ambien 1/2 tablet at night.  7.  Toprol XL 50 mg q.d.  8.  Coumadin 5 mg 7 days a week.   ALLERGIES:  None.   PAST MEDICAL HISTORY:  1.  History of intermittent atrial fibrillation.  2.  History of cholecystectomy.  3.  History of knee operations.   SOCIAL HISTORY:  The patient does not smoke. She drinks wine occasionally.   FAMILY HISTORY:  Her father died at age 59 due to cardiac disease from  rheumatic fever. Her mother died at age 29 due to complications of  Alzheimer's disease.   REVIEW OF SYMPTOMS:  Review of systems was reviewed and is essentially  negative.   PHYSICAL EXAMINATION:  GENERAL:  She is a middle-aged female in no acute  distress. She is alert and oriented x 3 and her mood and affect are  normal.  VITAL SIGNS:  Her weight is 133. Her blood pressure is 130/70 with a heart  rate of 60.  NECK:  There are 2+ carotids. She has no bruits. There is no JVD and no  thyromegaly.  LUNGS:  Clear to auscultation.  HEART:  Irregularly irregular. She has a soft systolic outflow murmur.  ABDOMEN:  Good bowel sounds and is nontender.  EXTREMITIES:  She has no clubbing, cyanosis, or edema.  NEUROLOGICAL:  Nonfocal.   ASSESSMENT:  Ms. Cutrona presents with atrial fibrillation. She has been  fairly well controlled for the past several weeks. We have discussed the  risks, benefits, and options of cardioversion. She understands and agrees to  proceed. We will schedule the cardioversion for later this week.      PJN/MEDQ  D:  04/22/2004  T:  04/22/2004  Job:  [id number]   cc:   Loraine Leriche A.  Waynard Edwards, M.D.  [address]  [place]  [place] [address]  Fax: [phone]

## 2010-06-21 NOTE — Consult Note (Signed)
NAME:  SPRINKLELorey, Pallett             ACCOUNT NO.:  [id number]   MEDICAL RECORD NO.:  [id number]          PATIENT TYPE:  EMS   LOCATION:  MAJO                         FACILITY:  [facility]   PHYSICIAN:  Vesta Mixer, M.D. DATE OF BIRTH:  1940/10/21   DATE OF CONSULTATION:  05/08/2004  DATE OF DISCHARGE:                                   CONSULTATION   Ms. Mcgaughy is a middle-aged female with a history of atrial fibrillation.  She was recently cardioverted.  She presented to the ER last night with an  episode of rapid atrial fibrillation.  The patient has had atrial  fibrillation for the past several weeks.  She has been on Coumadin.  She  underwent successful cardioversion approximately three weeks ago.  She has  still been on Coumadin since that time.   Last night she awoke with tachy palpitations and atrial fibrillation.  She  presented to the emergency room and received 15 mg of IV Cardizem.  This  successfully slowed her rate.  She felt well and did not have any shortness  of breath.   CURRENT MEDICATIONS:  1.  Fosamax once a week.  2.  Multivitamin once a day.  3.  Nexium 40 mg a day.  4.  Cozaar 25 mg a day.  5.  Ambien 2.5 mg as needed.  6.  Coumadin 5 mg seven days a week.  7.  She was previously on Toprol 50 mg a day and digoxin 0.25 mg a day.   ALLERGIES:  She has no known drug allergies.   PAST MEDICAL HISTORY:  1.  Atrial fibrillation.  2.  History of cholecystectomy.  3.  History of knee operations.   SOCIAL HISTORY:  The patient drinks a glass of wine every night.  She does  not smoke.   FAMILY HISTORY:  Her father died at age 59 due to cardiac disease from  rheumatic fever.  Her mother died at age 13 due to complications of  Alzheimer's disease.   REVIEW OF SYSTEMS:  Reviewed.  Is essentially negative.  Please see  previously dictated H&P earlier this month.   PHYSICAL EXAMINATION:  GENERAL:  She is a middle-aged female in no acute  distress.  She is  alert and oriented x3 and her mood and affect are normal.  VITAL SIGNS:  Her heart rate has ranged between 110-120.  Her blood pressure  is 110/70.  HEENT:  2+ carotids.  She has no bruits, no JVD, no thyromegaly.  LUNGS:  Clear to auscultation.  HEART:  Irregularly irregular.  It is somewhat tachycardic.  ABDOMEN:  Good bowel sounds and is nontender.  EXTREMITIES:  No clubbing, cyanosis, edema.  NEUROLOGIC:  Nonfocal.   LABORATORY DATA:  Basically unremarkable.   [name] presents with another episode of atrial fibrillation.  At this point her  rate is not too elevated and we can discharge her to home.  We will restart  her Toprol at 50 mg a day.  We will plan on doing a cardioversion again in  the next day or two.  We will do an adenosine Cardiolite  study in the next  several days to make sure she does not have ischemic heart disease.  This  will help [place] decide on which anti-arrhythmic to use.  We may be able to use  flecainide, sotalol, or propafenone if she does not have coronary artery  disease.  We could also use amiodarone but I would like to hold off and not  necessarily start that right now.  Will be in touch with her regarding the  upcoming cardioversion and Cardiolite study.  I have asked her to continue  the Coumadin for the time being.      PJN/MEDQ  D:  05/08/2004  T:  05/08/2004  Job:  [id number]   cc:   Loraine Leriche A. Waynard Edwards, M.D.  [address]  [place]  [place] [address]  Fax: [phone]

## 2010-06-21 NOTE — Consult Note (Signed)
NAME:  SPRINKLEPaiden, Caraveo             ACCOUNT NO.:  [id number]   MEDICAL RECORD NO.:  [id number]          PATIENT TYPE:  INP   LOCATION:  4705                         FACILITY:  [facility]   PHYSICIAN:  Vesta Mixer, M.D. DATE OF BIRTH:  1940-07-03   DATE OF CONSULTATION:  03/05/2004  DATE OF DISCHARGE:                                   CONSULTATION   CONSULTING PHYSICIAN:  Vesta Mixer, M.D.   REFERRING PHYSICIAN:  [name]. [name], M.D.   REASON FOR CONSULTATION:  The patient is a 70 year old female with a history  of chest pains in the past.  She also has a history of a tonsillectomy,  cholecystectomy and some lightheadedness.  She was admitted today with  episodes of atrial fibrillation with rapid ventricular response.   HISTORY OF PRESENT ILLNESS:  The patient was last seen in our office  approximately two and a half years ago for some episodes of lightheadedness  with exercise. She had an unremarkable stress Cardiolite study. She had no  evidence of ischemia and she had normal left ventricular systolic function.   She was found to have a heart murmur several months ago and Dr. Waynard Edwards sent  her over for an echocardiogram.  She was found to have normal left  ventricular systolic function.  She does have mild left ventricular  hypertrophy.  She has mild mitral regurgitation and mild tricuspid  regurgitation.   For approximately three weeks she developed viral gastroenteritis.  She  noticed some tachy palpitations during that time.  The viral illness  cleared, but she continued to have episodes of tachy palpitations.  She  thought that they would resolve, but they continued.  She gradually got  weaker and weaker and more short of breath.  She presented to Dr. Laurey Morale  office this morning and was found to have atrial fibrillation with a rapid  ventricular response.  She was admitted to the hospital for further  evaluation.   CURRENT MEDICATIONS:  1.  Fosamax once a week.  2.   Nexium once a day.  3.  Cozaar once a day.   ALLERGIES:  None.   PAST MEDICAL HISTORY:  1.  History of cholecystectomy.  2.  History of tonsillectomy.  3.  History of knee operations.   SOCIAL HISTORY:  The patient does not smoke. She drinks an occasional glass  of wine.   FAMILY HISTORY:  Father died at age 14 due to cardiac disease resulting from  rheumatic fever.  Her mother died at age 53 due to Alzheimer's disease.   REVIEW OF SYMPTOMS:  Review of systems was reviewed and is essentially  negative.   PHYSICAL EXAMINATION:  GENERAL:  On exam she is a middle aged female in no  acute distress.  She is alert and oriented times three and her mood and  affect are normal.  VITAL SIGNS:  Heart rate is 90, blood pressure 127/81, temperature 97.3.  HEENT EXAM:  Reveals 2+ carotids. She has no bruits.  There is no JVD and no  thyromegaly.  LUNGS:  Clear to auscultation.  HEART:  Irregularly irregular.  She has a very soft systolic outflow murmur.  ABDOMEN:  Active bowel sounds and is non-tender.  EXTREMITIES:  No cyanosis, clubbing or edema.  NEUROLOGIC:  Nonfocal.   LABORATORY DATA:  White blood cell count is 6.1, hemoglobin is 13.1,  hematocrit is 37.0.  Sodium 141, potassium 3.7, chloride 107,  C02 26, BUN  11, creatinine 0.8.  CPK is 65 with a MB of 1.3.   Her EKG reveals atrial fibrillation with a controlled ventricular response.  She has nonspecific ST and T wave abnormalities.   IMPRESSION AND PLAN:  1.  Atrial fibrillation.  The patient presents with new onset atrial      fibrillation with a rapid ventricular response.  The rate is much slower      now that she is on an IV Cardizem drip.  I agree with the IV heparin.      We will add Coumadin tonight.  I anticipate DC cardioversion in several      weeks.  We will make sure that she has had a TSH drawn recently.  She      had an echo in our office performed approximately one and a half months      ago.  She does have  mild mitral regurgitation but she has normal left      atrial size.  I do not think there will be any need to repeat the echo      at this time.  Thank you very much for this consultation.   It appears that she only has the atrial fibrillation as her current medical  issue. I would be happy to transfer her to my service if needed.      PJN/MEDQ  D:  03/05/2004  T:  03/05/2004  Job:  [id number]   cc:   Loraine Leriche A. Waynard Edwards, M.D.  [address]  [place]  [place] [address]  Fax: [phone]

## 2010-06-21 NOTE — Discharge Summary (Signed)
NAME:  Bassinger, Linnae             ACCOUNT NO.:  [id number]   MEDICAL RECORD NO.:  [id number]          PATIENT TYPE:  INP   LOCATION:  3040                         FACILITY:  [facility]   PHYSICIAN:  Tera Mater. Evlyn Kanner, M.D. DATE OF BIRTH:  1940-11-04   DATE OF ADMISSION:  05/20/2007  DATE OF DISCHARGE:  05/21/2007                               DISCHARGE SUMMARY   Ms. Prell is a 70 year old white female with a history of AFib,  osteoporosis, and hypertension, who presented to me in the emergency  room on May 20, 2007, with a new left brain TIA with resolution.  There was concern that this might be a cardiac embolic source and we  brought her in for evaluation.  She was seen in consultation by Dr.  Peter [place] of Cardiology and is seen by the Stroke Team, as well Dr.  Pearlean Brownie.  The patient was followed along and fortunately did quite well.  Everyone was in agreement that the patient not need a long-term Coumadin  with a history of AFib and hypertension with her age and this new  neurologic event.  Fortunately, the testing did not show any stroke.  Her carotids were clean that will be noted below, and the patient did  well without any further neurologic problems.  This patient was  discharged to home in improved condition on May 20, 2007.   DIET:  Her diet was to be no added salt.   MEDICATIONS:  At discharge, she was to take 5 mg of Coumadin on Saturday  and 5 mg Sunday, and have a PT/INR on Monday.  After discharge, she was  also to return to her medications of multivitamin daily, Nexium 40  daily, Cozaar 25 daily, Toprol XL 50 daily, Fosamax weekly, and fish oil  2 twice daily.  She is also on aspirin 81 mg daily.   OTHER EVALUATIONS:  Carotid duplex study showed mild intimal thickening  of the CCA and ICA bilaterally.  Vertebral artery flow was antegrade  bilaterally.  There is no significant right or left ICA stenosis.  EKG  was actually read as sinus rhythm with PACs, and it did  not appear that  she was in Afib at the time of this tracing, and earlier tracing did  show sinus rhythm as well as that the patient had a history of Afib.  Her MRI of the brain showed no acute infarct, no intracranial  hemorrhage, no hydrocephalus, and no intracranial mass.  Major anterior  cranial vascular studies were patent, ectatic vertebral artery caused  slight indentation on the medulla, very minimal partial opacification of  the mastoid air cells.  MRA showed ectasia of the major intracranial  vasculature over the mild branch vessel irregularity.  Her initial white  count was 4600, hemoglobin 12.7, MCV 92, and platelets 207,000.  Initial  INR was 0.9, repeat was 0.9.  Sodium was 138, potassium 4.3, and  chloride 105.  BUN 20 and glucose 106.  Repeat chemistries were normal.  Albumin was 3.9, AST was 25, ALT was 22, alk phos was 15, and total bili  was 0.6.  A1c  was 5.6%.  CK was 66.  Troponin was less than 0.01.  Lipids showed a total cholesterol of 171, triglycerides of 59, HDL of  59, and LDL of 102.  Urinalysis showed trace leukocyte esterase and  turbidity, but no cells.  An echo from March 09, 2007 showed normal LV  function, mild aortic sclerosis, and mild mitral regurgitation and trace  tricuspid regurgitation of left ventricular.  Diastolic function was  normal as well.   In summary, we have a 70 year old white female admitted with a TIA that  resolved.  Workup for intracranial blockages was negative.  The patient  remained neurologically stable.  The patient was to be started on  Coumadin, but the Stroke Team did not feel like we needed to have a  therapeutic prior to leaving since she was in sinus rhythm.  The patient  was discharged to home with medicines as noted above.           ______________________________  Tera Mater. Evlyn Kanner, M.D.     SAS/MEDQ  D:  07/01/2007  T:  07/02/2007  Job:  [id number]

## 2010-06-25 ENCOUNTER — Other Ambulatory Visit: Payer: Self-pay | Admitting: Cardiology

## 2010-06-26 ENCOUNTER — Other Ambulatory Visit: Payer: Self-pay | Admitting: *Deleted

## 2010-06-26 MED ORDER — METOPROLOL TARTRATE 50 MG PO TABS: 50.0000 mg | ORAL_TABLET | Freq: Every day | ORAL | Status: DC

## 2010-06-26 NOTE — Telephone Encounter (Signed)
Fax from pharmcy said metoprolol 50mg  chart said 25mg  and pt agreed to dose. Pharmacy called and they state it has been 50mg  for years. 50 mg was refilled.Alfonso Ramus RN

## 2010-07-22 ENCOUNTER — Other Ambulatory Visit: Payer: Self-pay | Admitting: *Deleted

## 2010-07-22 MED ORDER — FLECAINIDE ACETATE 100 MG PO TABS: 100.0000 mg | ORAL_TABLET | Freq: Two times a day (BID) | ORAL | Status: DC

## 2010-07-22 NOTE — Telephone Encounter (Signed)
Fax received from pharmacy. Refill completed. Jodette Danial Hlavac RN  

## 2010-07-25 ENCOUNTER — Other Ambulatory Visit: Payer: Self-pay | Admitting: *Deleted

## 2010-07-25 MED ORDER — FLECAINIDE ACETATE 100 MG PO TABS: 100.0000 mg | ORAL_TABLET | Freq: Two times a day (BID) | ORAL | Status: DC

## 2010-07-25 NOTE — Telephone Encounter (Signed)
See attached notes from Dr. Elease Hashimoto

## 2010-07-26 ENCOUNTER — Other Ambulatory Visit: Payer: Self-pay | Admitting: *Deleted

## 2010-07-26 DIAGNOSIS — I4891 Unspecified atrial fibrillation: Secondary | ICD-10-CM

## 2010-07-26 DIAGNOSIS — I10 Essential (primary) hypertension: Secondary | ICD-10-CM

## 2010-07-26 MED ORDER — FLECAINIDE ACETATE 100 MG PO TABS: 100.0000 mg | ORAL_TABLET | Freq: Two times a day (BID) | ORAL | Status: DC

## 2010-07-26 NOTE — Telephone Encounter (Signed)
Faxed refill for flecainide to [facility]

## 2010-09-26 ENCOUNTER — Other Ambulatory Visit: Payer: Self-pay | Admitting: Obstetrics and Gynecology

## 2010-09-26 DIAGNOSIS — Z1231 Encounter for screening mammogram for malignant neoplasm of breast: Secondary | ICD-10-CM

## 2010-10-04 ENCOUNTER — Encounter: Payer: Self-pay | Admitting: Cardiovascular Disease

## 2010-10-29 LAB — LIPID PANEL
Cholesterol: 171
HDL: 59
LDL Cholesterol: 102 — ABNORMAL HIGH
Total CHOL/HDL Ratio: 2.9
Triglycerides: 51

## 2010-10-29 LAB — CK TOTAL AND CKMB (NOT AT ARMC): Relative Index: INVALID

## 2010-10-29 LAB — URINALYSIS, ROUTINE W REFLEX MICROSCOPIC
Bilirubin Urine: NEGATIVE
Ketones, ur: NEGATIVE
Nitrite: NEGATIVE
Protein, ur: NEGATIVE
Specific Gravity, Urine: 1.021
Urobilinogen, UA: 0.2

## 2010-10-29 LAB — COMPREHENSIVE METABOLIC PANEL
ALT: 22
AST: 25
Alkaline Phosphatase: 52
CO2: 28
Chloride: 102
GFR calc Af Amer: >60
GFR calc non Af Amer: >60
Glucose, Bld: 95
Sodium: 136
Total Bilirubin: 0.6

## 2010-10-29 LAB — CBC
Hemoglobin: 12.7
MCHC: 34
RDW: 13.8

## 2010-10-29 LAB — DIFFERENTIAL
Basophils Absolute: 0
Basophils Relative: 1
Eosinophils Absolute: 0
Monocytes Absolute: 0.4
Neutro Abs: 2.9

## 2010-10-29 LAB — POCT I-STAT, CHEM 8
BUN: 20
Calcium, Ion: 1.08 — ABNORMAL LOW
Chloride: 105
Glucose, Bld: 106 — ABNORMAL HIGH
TCO2: 25

## 2010-10-29 LAB — PROTIME-INR
INR: 0.9
Prothrombin Time: 12.7

## 2010-10-29 LAB — HOMOCYSTEINE: Homocysteine: 5.3

## 2010-10-31 ENCOUNTER — Encounter: Payer: Self-pay | Admitting: Cardiovascular Disease

## 2010-10-31 ENCOUNTER — Ambulatory Visit (INDEPENDENT_AMBULATORY_CARE_PROVIDER_SITE_OTHER): Payer: Medicare Other | Admitting: Cardiovascular Disease

## 2010-10-31 VITALS — BP 122/80 | HR 44 | Ht 62.0 in | Wt 144.8 lb

## 2010-10-31 DIAGNOSIS — I4891 Unspecified atrial fibrillation: Secondary | ICD-10-CM

## 2010-10-31 NOTE — Assessment & Plan Note (Signed)
Her HR is very slow.  She is maintaining sinus rhythm.  We will decrease her metoprolol to 25 mg a day. I would like for her to have a heart rate between 60 and 70.  If the  decrease in the metoprolol does not allow her heart rate increased up to that range, she is to discontinue the Toprol completely. She'll call us back in several weeks to let us know how she's doing. I'll see her again in 6 months.     Continue Flecainide at 100 po BID

## 2010-10-31 NOTE — Progress Notes (Signed)
[name] [name] Date of Birth  [birth year] [facility] [address]    [address] [place], [place]  [address] [phone]  Fax  [phone]  History of Present Illness:  70 year old female with a history of atrial fibrillation. Her heart rate has been fairly well controlled on flecainide 100 mg twice a day . She has episodes of severe fatigue after she takes her medications.     Current Outpatient Prescriptions on File Prior to Visit  Medication Sig Dispense Refill  . aspirin 81 MG tablet Take 325 mg by mouth daily.        [name] CALCIUM PO Take by mouth.        . flecainide (TAMBOCOR) 100 MG tablet Take 1 tablet (100 mg total) by mouth 2 (two) times daily.  60 tablet  12  . losartan (COZAAR) 25 MG tablet Take 25 mg by mouth daily.        [name] MAGNESIUM PO Take by mouth daily.        . metoprolol (LOPRESSOR) 50 MG tablet Take 1 tablet (50 mg total) by mouth daily.  90 tablet  3  . Multiple Vitamin (MULTIVITAMIN PO) Take by mouth.          No Known Allergies  Past Medical History  Diagnosis Date  . Atrial fibrillation   . Hypertension   . Hyperlipidemia   . Leg fracture Dec 21,2011    recent   . Hip fracture 11-08-2008    Recent  . Chronic anticoagulation   . Hx of cholecystectomy     Past Surgical History  Procedure Date  . Gallbladder surgery   . Knee surgery     2  . Tonsillectomy   . US echocardiography 03-10-2008    Est EF 55-60%  . Cardiovascular stress test 06-06-2008    EF 73%    History  Smoking status  . Never Smoker   Smokeless tobacco  . Not on file    History  Alcohol Use  . Yes    wine x2 per day    Family History  Problem Relation Age of Onset  . Alzheimer's disease Mother     Reviw of Systems:  Reviewed in the HPI.  All other systems are negative.  Physical Exam: BP 122/80  Pulse 44  Ht 5\' 2"  (1.575 m)  Wt 144 lb 12.8 oz (65.681 kg)  BMI 26.48 kg/m2 The patient is alert and oriented x 3.  The mood and affect are normal.   Skin:  warm and dry.  Color is normal.    HEENT:   the sclera are nonicteric.  The mucous membranes are moist.  The carotids are 2+ without bruits.  There is no thyromegaly.  There is no JVD.    Lungs: clear.  The chest wall is non tender.    Heart: regular rate with a normal S1 and S2.  There are no murmurs, gallops, or rubs. The PMI is not displaced.     Abdomen: good bowel sounds.  There is no guarding or rebound.  There is no hepatosplenomegaly or tenderness.  There are no masses.   Extremities:  no clubbing, cyanosis, or edema.  The legs are without rashes.  The distal pulses are intact.   Neuro:  Cranial nerves II - XII are intact.  Motor and sensory functions are intact.    The gait is normal.  ECG: Sinus bradycardia. She has no ST or T wave changes.  Assessment / Plan:

## 2010-10-31 NOTE — Patient Instructions (Signed)
Reduce metoprolol to 1/2 tablet a day. If your heart rate does not increase into the 60-70 range and discontinue the Metoprolol completely.

## 2010-11-04 ENCOUNTER — Other Ambulatory Visit: Payer: Self-pay | Admitting: Obstetrics and Gynecology

## 2010-11-04 ENCOUNTER — Ambulatory Visit
Admission: RE | Admit: 2010-11-04 | Discharge: 2010-11-04 | Disposition: A | Discharge status: Home / Self Care | Payer: Medicare Other | Source: Ambulatory Visit | Attending: Obstetrics and Gynecology | Admitting: Obstetrics and Gynecology

## 2010-11-04 DIAGNOSIS — Z1231 Encounter for screening mammogram for malignant neoplasm of breast: Secondary | ICD-10-CM

## 2010-11-04 DIAGNOSIS — N6310 Unspecified lump in the right breast, unspecified quadrant: Secondary | ICD-10-CM

## 2010-11-08 ENCOUNTER — Other Ambulatory Visit: Payer: Self-pay | Admitting: Obstetrics and Gynecology

## 2010-11-08 DIAGNOSIS — N6310 Unspecified lump in the right breast, unspecified quadrant: Secondary | ICD-10-CM

## 2010-11-20 ENCOUNTER — Ambulatory Visit
Admission: RE | Admit: 2010-11-20 | Discharge: 2010-11-20 | Disposition: A | Discharge status: Home / Self Care | Payer: Medicare Other | Source: Ambulatory Visit | Attending: Obstetrics and Gynecology | Admitting: Obstetrics and Gynecology

## 2010-11-20 DIAGNOSIS — N6310 Unspecified lump in the right breast, unspecified quadrant: Secondary | ICD-10-CM

## 2010-12-30 ENCOUNTER — Other Ambulatory Visit: Payer: Self-pay | Admitting: Cardiovascular Disease

## 2011-03-12 DIAGNOSIS — I4891 Unspecified atrial fibrillation: Secondary | ICD-10-CM | POA: Diagnosis not present

## 2011-03-12 DIAGNOSIS — M810 Age-related osteoporosis without current pathological fracture: Secondary | ICD-10-CM | POA: Diagnosis not present

## 2011-03-12 DIAGNOSIS — E785 Hyperlipidemia, unspecified: Secondary | ICD-10-CM | POA: Diagnosis not present

## 2011-03-12 DIAGNOSIS — I10 Essential (primary) hypertension: Secondary | ICD-10-CM | POA: Diagnosis not present

## 2011-04-08 DIAGNOSIS — G20 Parkinson's disease: Secondary | ICD-10-CM | POA: Diagnosis not present

## 2011-04-29 ENCOUNTER — Ambulatory Visit: Payer: Medicare Other | Admitting: Cardiovascular Disease

## 2011-05-07 DIAGNOSIS — Z01419 Encounter for gynecological examination (general) (routine) without abnormal findings: Secondary | ICD-10-CM | POA: Diagnosis not present

## 2011-05-07 DIAGNOSIS — Z124 Encounter for screening for malignant neoplasm of cervix: Secondary | ICD-10-CM | POA: Diagnosis not present

## 2011-06-03 ENCOUNTER — Encounter: Payer: Self-pay | Admitting: Cardiovascular Disease

## 2011-06-03 ENCOUNTER — Ambulatory Visit (INDEPENDENT_AMBULATORY_CARE_PROVIDER_SITE_OTHER): Payer: Medicare Other | Admitting: Cardiovascular Disease

## 2011-06-03 DIAGNOSIS — R079 Chest pain, unspecified: Secondary | ICD-10-CM | POA: Insufficient documentation

## 2011-06-03 NOTE — Progress Notes (Signed)
[name] [name]  Date of Birth  09-08-1940   [facility] [address]    [address] [place], [place]  [address] [phone]  Fax  [phone]  Problems: 1. Atrial Fibrillation  2. Hypertension 3. Hyperlipidemia 4. Parkinson's    History of Present Illness:  71 year old female with a history of atrial fibrillation. Her heart rate has been fairly well controlled on flecainide 100 mg twice a day . She has episodes of severe fatigue after she takes her medications.    Pt has had some chest pain with walking over the past several months. She had a normal stress Myoview study in May, 2010. She has had a few episodes of atrial ablation a typically occur when she's dehydrated. She is modestly better hydrated and this seems to make his episodes of atrial fibrillation.    She typically walks 50 minutes a day-4 times a week.  She's had 2 episodes of chest pressure while walking. The episode started with exercise and was relieved when she stops to rest.  She stopped her statin because of leg weakness.  She feels a bit better off the statin.    Current Outpatient Prescriptions on File Prior to Visit  Medication Sig Dispense Refill  . aspirin 81 MG tablet Take 325 mg by mouth daily.        . B Complex Vitamins (B COMPLEX PO) Take by mouth.        [name] CALCIUM PO Take by mouth.        . Calcium Polycarbophil (FIBERCON PO) Take by mouth daily.        . Coenzyme Q10 (CO Q 10 PO) Take by mouth.        Tery Sanfilippo Sodium (COLACE PO) Take by mouth daily.        . flecainide (TAMBOCOR) 100 MG tablet Take 1 tablet (100 mg total) by mouth 2 (two) times daily.  60 tablet  12  . flecainide (TAMBOCOR) 100 MG tablet TAKE 1 TABLET BY MOUTH TWICE DAILY  60 tablet  5  . losartan (COZAAR) 25 MG tablet Take 25 mg by mouth daily.        [name] MAGNESIUM PO Take by mouth daily.        . metoprolol (LOPRESSOR) 50 MG tablet Take 1 tablet (50 mg total) by mouth daily.  90 tablet  3  . Multiple Vitamin  (MULTIVITAMIN PO) Take by mouth.        . Omega-3 Fatty Acids (FISH OIL PO) Take by mouth.        . Omeprazole (PRILOSEC PO) Take by mouth daily.        . rasagiline (AZILECT) 0.5 MG TABS Take 0.5 mg by mouth daily.          No Known Allergies  Past Medical History  Diagnosis Date  . Atrial fibrillation   . Hypertension   . Hyperlipidemia   . Leg fracture Dec 21,2011    recent   . Hip fracture 11-08-2008    Recent  . Chronic anticoagulation   . Hx of cholecystectomy     Past Surgical History  Procedure Date  . Gallbladder surgery   . Knee surgery     2  . Tonsillectomy   . US echocardiography 03-10-2008    Est EF 55-60%  . Cardiovascular stress test 06-06-2008    EF 73%    History  Smoking status  . Never Smoker   Smokeless tobacco  . Not on file  History  Alcohol Use  . Yes    wine x2 per day    Family History  Problem Relation Age of Onset  . Alzheimer's disease Mother     Reviw of Systems:  Reviewed in the HPI.  All other systems are negative.  Physical Exam: BP 120/84  Pulse 60  Ht 5\' 2"  (1.575 m)  Wt 146 lb (66.225 kg)  BMI 26.70 kg/m2 The patient is alert and oriented x 3.  The mood and affect are normal.   Skin: warm and dry.  Color is normal.    HEENT:   the sclera are nonicteric.  The mucous membranes are moist.  The carotids are 2+ without bruits.  There is no thyromegaly.  There is no JVD.    Lungs: clear.  The chest wall is non tender.    Heart: regular rate with a normal S1 and S2.  There are no murmurs, gallops, or rubs. The PMI is not displaced.     Abdomen: good bowel sounds.  There is no guarding or rebound.  There is no hepatosplenomegaly or tenderness.  There are no masses.   Extremities:  no clubbing, cyanosis, or edema.  The legs are without rashes.  The distal pulses are intact.   Neuro:  Cranial nerves II - XII are intact.  Motor and sensory functions are intact.    The gait is normal.  ECG:  Assessment / Plan:

## 2011-06-03 NOTE — Patient Instructions (Signed)
Your physician recommends that you schedule a follow-up appointment in: in September  Your physician recommends that you continue on your current medications as directed. Please refer to the Current Medication list given to you today.

## 2011-06-03 NOTE — Assessment & Plan Note (Signed)
[name] presents with episodes of chest discomfort. She is currently on flecainide for her atrial fibrillation. I think it will be very important to make sure that she does not have significant coronary artery disease since this would be very dangerous while she's taking flecainide.  I have  scheduled her for a stress Myoview study.  I'll see her back in the office in several months.

## 2011-06-05 ENCOUNTER — Other Ambulatory Visit: Payer: Self-pay | Admitting: Cardiology

## 2011-06-05 MED ORDER — METOPROLOL TARTRATE 50 MG PO TABS: 50.0000 mg | ORAL_TABLET | Freq: Every day | ORAL | Status: DC

## 2011-06-24 ENCOUNTER — Other Ambulatory Visit: Payer: Self-pay | Admitting: *Deleted

## 2011-06-24 DIAGNOSIS — I10 Essential (primary) hypertension: Secondary | ICD-10-CM

## 2011-06-24 DIAGNOSIS — I4891 Unspecified atrial fibrillation: Secondary | ICD-10-CM

## 2011-06-24 MED ORDER — FLECAINIDE ACETATE 100 MG PO TABS: 100.0000 mg | ORAL_TABLET | Freq: Two times a day (BID) | ORAL | Status: DC

## 2011-06-25 ENCOUNTER — Encounter: Payer: Self-pay | Admitting: Cardiovascular Disease

## 2011-06-25 DIAGNOSIS — I10 Essential (primary) hypertension: Secondary | ICD-10-CM | POA: Diagnosis not present

## 2011-06-25 DIAGNOSIS — R82998 Other abnormal findings in urine: Secondary | ICD-10-CM | POA: Diagnosis not present

## 2011-06-25 DIAGNOSIS — M899 Disorder of bone, unspecified: Secondary | ICD-10-CM | POA: Diagnosis not present

## 2011-06-25 DIAGNOSIS — E785 Hyperlipidemia, unspecified: Secondary | ICD-10-CM | POA: Diagnosis not present

## 2011-07-02 DIAGNOSIS — Z1212 Encounter for screening for malignant neoplasm of rectum: Secondary | ICD-10-CM | POA: Diagnosis not present

## 2011-07-31 DIAGNOSIS — Z0000 Encounter for general adult medical examination without abnormal findings: Secondary | ICD-10-CM | POA: Diagnosis not present

## 2011-07-31 DIAGNOSIS — G20 Parkinson's disease: Secondary | ICD-10-CM | POA: Diagnosis not present

## 2011-07-31 DIAGNOSIS — R3129 Other microscopic hematuria: Secondary | ICD-10-CM | POA: Diagnosis not present

## 2011-07-31 DIAGNOSIS — I4891 Unspecified atrial fibrillation: Secondary | ICD-10-CM | POA: Diagnosis not present

## 2011-07-31 DIAGNOSIS — J984 Other disorders of lung: Secondary | ICD-10-CM | POA: Diagnosis not present

## 2011-08-01 ENCOUNTER — Telehealth: Payer: Self-pay | Admitting: Cardiovascular Disease

## 2011-08-01 MED ORDER — RIVAROXABAN 20 MG PO TABS: 20.0000 mg | ORAL_TABLET | Freq: Every day | ORAL | Status: DC

## 2011-08-01 NOTE — Telephone Encounter (Signed)
Per Dr Waynard Edwards pt in afib, pt was given copy of ekg. HR 77 and feeling ok. Doesn't  want appointment today or next week, she is currently in [place]  on vacation. Pt placed on xarelto 20 mg daily, told to take extra half of 100 mg tablet of flecanide (50mg ) now and usual dose tonight. Appointment was given 08/11/11.  Pt told to go to ER if she don't feel well like SOB/ hr too fast or too slow or CP. Pt agreed to plan. Dr Elease Hashimoto and Norma Fredrickson NP were consulted.

## 2011-08-01 NOTE — Telephone Encounter (Signed)
New Problem:    Patient called in because her PCP said that she is back in Afib and was wondering how she needed to proceed.  Please call back.

## 2011-08-01 NOTE — Telephone Encounter (Signed)
Agree 

## 2011-08-11 ENCOUNTER — Ambulatory Visit (INDEPENDENT_AMBULATORY_CARE_PROVIDER_SITE_OTHER): Payer: Medicare Other | Admitting: Nurse Practitioner

## 2011-08-11 ENCOUNTER — Encounter: Payer: Self-pay | Admitting: Nurse Practitioner

## 2011-08-11 VITALS — BP 132/84 | HR 81 | Ht 62.0 in | Wt 147.2 lb

## 2011-08-11 DIAGNOSIS — I4891 Unspecified atrial fibrillation: Secondary | ICD-10-CM | POA: Diagnosis not present

## 2011-08-11 NOTE — Assessment & Plan Note (Addendum)
Patient has recurrent atrial fib and is symptomatic. Has been on Xarelto. No doses missed. Has had recent labs which are reportedly ok. I have discussed her case with Dr. Elease Hashimoto. We will go ahead and update the Myoview as he intended in April. Will update her echo as well. He will see her back in 3 weeks with an EKG. If she remains in atrial fib, then we will proceed on with cardioversion. He would like to hold off on attempts at ablation at this time. We will continue with her Flecainide and other medicines for now. Activities are to be as tolerated. Her last EKG in September did show marked bradycardia. Will need to keep this is mind as we proceed further. Patient is agreeable to this plan and will call if any problems develop in the interim.

## 2011-08-11 NOTE — Progress Notes (Signed)
[name] [name] Date of Birth: 23-Oct-1940 Medical Record #[id number]  History of Present Illness: Ms. [name] [name] is seen today for a work in visit. She is seen for Dr. Elease Hashimoto. She has a history of PAF. Last ECG back in September of 2012 showed marked sinus bradycardia. Her other issues include HTN and HLD. She is on chronic Flecainide. She called at the end of June after a visit with Dr. Waynard Edwards. He noted recurrent atrial fib. She was placed on Xarelto and had her flecainide dose adjusted. She did not seem to be symptomatic and was going on with her vacation.  She comes in today. She is here alone. She does not really feel all that well. She knows she is back out of rhythm. Feels like it started at the end of June. She was feeling tense and rushed. Saw Dr. Waynard Edwards for her physical on the 28th of June. Noted to be in atrial fib. She does have some DOE. She is tired and fatigues easily. A little lightheaded but no passing out spells. She was having chest pain at her last visit with Dr. Melburn Popper and a stress test was recommended but for some reason, not ordered. She is not having any more chest pain. She is on Xarelto. She has noted a little bit of blood on her pillow after the first few days of starting but none over the past week. She has had recent labs with Dr. Waynard Edwards. Her thyroid may be off a little but not enough to warrant treatment. She says Dr. Waynard Edwards is more in favor or possible ablation in light of her Parkinson's. Her Parkinson's seems quite mild but she is concerned if it were to progress and she was on anticoagulation in the future.   Current Outpatient Prescriptions on File Prior to Visit  Medication Sig Dispense Refill  . B Complex Vitamins (B COMPLEX PO) Take by mouth.        [name] CALCIUM PO Take by mouth.        . Calcium Polycarbophil (FIBERCON PO) Take by mouth daily.        . Coenzyme Q10 (CO Q 10 PO) Take by mouth.        Tery Sanfilippo Sodium (COLACE PO) Take by mouth daily.        [name]  ezetimibe (ZETIA) 10 MG tablet Take 10 mg by mouth every other day. Taking the same day as the crestor      . flecainide (TAMBOCOR) 100 MG tablet Take 1 tablet (100 mg total) by mouth 2 (two) times daily.  60 tablet  12  . losartan (COZAAR) 25 MG tablet Take 25 mg by mouth daily.        [name] MAGNESIUM PO Take by mouth daily.        . metoprolol succinate (TOPROL-XL) 50 MG 24 hr tablet Take 50 mg by mouth daily. Take with or immediately following a meal.      . Multiple Vitamin (MULTIVITAMIN PO) Take by mouth.        . Omega-3 Fatty Acids (FISH OIL PO) Take by mouth.        . Omeprazole (PRILOSEC PO) Take by mouth daily.        . rasagiline (AZILECT) 0.5 MG TABS Take 0.5 mg by mouth daily.        . Rivaroxaban (XARELTO) 20 MG TABS Take 1 tablet (20 mg total) by mouth daily.  30 tablet  1    No Known Allergies  Past Medical History  Diagnosis Date  . PAF (paroxysmal atrial fibrillation)     has had prior cardioversion in 2010; on Flecainide  . Hypertension   . Hyperlipidemia   . Leg fracture Dec 21,2011    recent   . Hip fracture 11-08-2008    Recent  . Chronic anticoagulation     on Xarelto  . Hx of cholecystectomy   . High risk medication use     on Flecainide    Past Surgical History  Procedure Date  . Gallbladder surgery   . Knee surgery     2  . Tonsillectomy   . US echocardiography 03-10-2008    Est EF 55-60%  . Cardiovascular stress test 06-06-2008    EF 73%    History  Smoking status  . Never Smoker   Smokeless tobacco  . Not on file    History  Alcohol Use  . Yes    wine x2 per day    Family History  Problem Relation Age of Onset  . Alzheimer's disease Mother     Review of Systems: The review of systems is per the HPI.  All other systems were reviewed and are negative.  Physical Exam: BP 132/84  Pulse 81  Ht 5\' 2"  (1.575 m)  Wt 147 lb 3.2 oz (66.769 kg)  BMI 26.92 kg/m2 Patient is very pleasant and in no acute distress. Skin is warm and dry. Color  is normal.  HEENT is unremarkable. Normocephalic/atraumatic. PERRL. Sclera are nonicteric. Neck is supple. No masses. No JVD. Lungs are clear. Cardiac exam shows an irregular rhythm. Her rate is controlled.  Abdomen is soft. Extremities are without edema. Gait and ROM are intact. No gross neurologic deficits noted.  LABORATORY DATA:  EKG today shows atrial fib with a controlled ventricular response. Tracing is reviewed with Dr. Elease Hashimoto today.  Assessment / Plan:

## 2011-08-11 NOTE — Patient Instructions (Addendum)
We are going to arrange for an ultrasound of your heart and a stress test  Stay on your current medicines  We will see you back in 3 weeks. If you are still out of rhythm, we will arrange for a cardioversion.  Call the [facility] office at [phone] if you have any questions, problems or concerns.

## 2011-08-11 NOTE — Addendum Note (Signed)
Addended by: Vista Mink D on: 08/11/2011 04:29 PM   Modules accepted: Orders

## 2011-08-14 NOTE — Addendum Note (Signed)
Addended by: Lisabeth Devoid F on: 08/14/2011 09:42 AM   Modules accepted: Orders

## 2011-08-26 ENCOUNTER — Ambulatory Visit (HOSPITAL_COMMUNITY): Payer: Medicare Other | Attending: Cardiovascular Disease | Admitting: Radiology

## 2011-08-26 DIAGNOSIS — R0989 Other specified symptoms and signs involving the circulatory and respiratory systems: Secondary | ICD-10-CM | POA: Diagnosis not present

## 2011-08-26 DIAGNOSIS — E119 Type 2 diabetes mellitus without complications: Secondary | ICD-10-CM | POA: Insufficient documentation

## 2011-08-26 DIAGNOSIS — R0609 Other forms of dyspnea: Secondary | ICD-10-CM | POA: Diagnosis not present

## 2011-08-26 DIAGNOSIS — I079 Rheumatic tricuspid valve disease, unspecified: Secondary | ICD-10-CM | POA: Insufficient documentation

## 2011-08-26 DIAGNOSIS — I4891 Unspecified atrial fibrillation: Secondary | ICD-10-CM | POA: Diagnosis not present

## 2011-08-26 DIAGNOSIS — E785 Hyperlipidemia, unspecified: Secondary | ICD-10-CM | POA: Insufficient documentation

## 2011-08-26 NOTE — Progress Notes (Signed)
Echocardiogram performed.  

## 2011-08-27 ENCOUNTER — Ambulatory Visit (HOSPITAL_COMMUNITY): Payer: Medicare Other | Attending: Internal Medicine | Admitting: Radiology

## 2011-08-27 VITALS — BP 110/86 | Ht 62.0 in | Wt 145.0 lb

## 2011-08-27 DIAGNOSIS — R0602 Shortness of breath: Secondary | ICD-10-CM

## 2011-08-27 DIAGNOSIS — I4891 Unspecified atrial fibrillation: Secondary | ICD-10-CM | POA: Diagnosis not present

## 2011-08-27 DIAGNOSIS — R079 Chest pain, unspecified: Secondary | ICD-10-CM | POA: Insufficient documentation

## 2011-08-27 MED ORDER — REGADENOSON 0.4 MG/5ML IV SOLN
0.4000 mg | Freq: Once | INTRAVENOUS | Status: AC
Start: 2011-08-27 — End: 2011-08-27
  Administered 2011-08-27: 0.4 mg via INTRAVENOUS

## 2011-08-27 MED ORDER — TECHNETIUM TC 99M TETROFOSMIN IV KIT
11.0000 | PACK | Freq: Once | INTRAVENOUS | Status: AC | PRN
  Administered 2011-08-27: 11 via INTRAVENOUS

## 2011-08-27 MED ORDER — TECHNETIUM TC 99M TETROFOSMIN IV KIT
33.0000 | PACK | Freq: Once | INTRAVENOUS | Status: AC | PRN
  Administered 2011-08-27: 33 via INTRAVENOUS

## 2011-08-27 NOTE — Progress Notes (Signed)
[facility] SITE 3 NUCLEAR MED [address] [place] [address] [phone]  Cardiology Nuclear Med Study  [name] [name] is a 71 y.o. female     MRN : [id number]     DOB: Feb 08, 1940  Procedure Date: 08/27/2011  Nuclear Med Background Indication for Stress Test:  Evaluation for Ischemia, and Pending possible Cardioversion in the future History:  2013 Echo EF 50-55%, 2010 Cardioversion, MPS EF 73%, Normal Cardiac Risk Factors: Family History - CAD, Hypertension and Lipids  Symptoms:  Chest Pain, DOE, Fatigue, Fatigue with Exertion, Light-Headedness and SOB   Nuclear Pre-Procedure Caffeine/Decaff Intake:  None > 12 hrs NPO After: 8:00pm   Lungs:  clear O2 Sat: 97% on room air. IV 0.9% NS with Angio Cath:  20g  IV Site: L Antecubital x 1, tolerated well IV Started by:  Irean Hong, RN  Chest Size (in):  36 Cup Size: D  Height: 5\' 2"  (1.575 m)  Weight:  145 lb (65.772 kg)  BMI:  Body mass index is 26.52 kg/(m^2). Tech Comments:  Took Flecainide and Toprol this am    Nuclear Med Study 1 or 2 day study: 1 day  Stress Test Type:  Lexiscan  Reading MD: Arvilla Meres, MD  Order Authorizing Provider:  Kristeen Miss, MD, and Norma Fredrickson, NP  Resting Radionuclide: Technetium 49m Tetrofosmin  Resting Radionuclide Dose: 11.0 mCi   Stress Radionuclide:  Technetium 32m Tetrofosmin  Stress Radionuclide Dose: 33.0 mCi           Stress Protocol Rest HR: 59 Stress HR: 88  Rest BP: 110/86 Stress BP: 96/77  Exercise Time (min): n/a METS: n/a   Predicted Max HR: 151 bpm % Max HR: 58.28 bpm Rate Pressure Product: 9328   Dose of Adenosine (mg):  n/a Dose of Lexiscan: 0.4 mg  Dose of Atropine (mg): n/a Dose of Dobutamine: n/a mcg/kg/min (at max HR)  Stress Test Technologist: Bonnita Levan, RN  Nuclear Technologist:  Domenic Polite, CNMT     Rest Procedure:  Myocardial perfusion imaging was performed at rest 45 minutes following the intravenous administration of  Technetium 41m Tetrofosmin. Rest ECG: Atrial Fibrilliation  Stress Procedure:  The patient received IV Lexiscan 0.4 mg over 15-seconds.  Technetium 71m Tetrofosmin injected at 30-seconds.  There were no significant changes with Lexiscan.  Quantitative spect images were obtained after a 45 minute delay. Stress ECG: No significant change from baseline ECG  QPS Raw Data Images:  Normal; no motion artifact; normal heart/lung ratio. Stress Images:  There is a small area of mild to moderately decreased uptake in the distal anterior wall and apex. Rest Images: There is a small area of mild to moderately decreased uptake in the distal anterior wall and apex. Subtraction (SDS):  There is a fixed anteriour defect that is most consistent with breast attenuation. Transient Ischemic Dilatation (Normal <1.22):  0.91 Lung/Heart Ratio (Normal <0.45):  0.40  Quantitative Gated Spect Images QGS EDV:  59 ml QGS ESV:  17 ml  Impression Exercise Capacity:  Lexiscan with no exercise. BP Response:  n/a Clinical Symptoms:  n/a ECG Impression:  No significant ECG changes with Lexiscan. Comparison with Prior Nuclear Study: No images to compare  Overall Impression:  Normal stress nuclear study. There is a small fixed defect in the distal anterior wall and apex that likely represents breast attenuation.   LV Ejection Fraction: 71%.  LV Wall Motion:  NL LV Function; NL Wall Motion    Matan Steen,MD 9:24 PM

## 2011-09-02 ENCOUNTER — Encounter: Payer: Self-pay | Admitting: Nurse Practitioner

## 2011-09-02 ENCOUNTER — Ambulatory Visit
Admission: RE | Admit: 2011-09-02 | Discharge: 2011-09-02 | Disposition: A | Discharge status: Home / Self Care | Payer: Medicare Other | Source: Ambulatory Visit | Attending: Nurse Practitioner | Admitting: Nurse Practitioner

## 2011-09-02 ENCOUNTER — Other Ambulatory Visit: Payer: Self-pay | Admitting: Nurse Practitioner

## 2011-09-02 ENCOUNTER — Ambulatory Visit (INDEPENDENT_AMBULATORY_CARE_PROVIDER_SITE_OTHER): Payer: Medicare Other | Admitting: Nurse Practitioner

## 2011-09-02 VITALS — BP 116/78 | HR 78 | Ht 62.0 in | Wt 146.0 lb

## 2011-09-02 DIAGNOSIS — Z01811 Encounter for preprocedural respiratory examination: Secondary | ICD-10-CM | POA: Diagnosis not present

## 2011-09-02 DIAGNOSIS — I4891 Unspecified atrial fibrillation: Secondary | ICD-10-CM

## 2011-09-02 DIAGNOSIS — Z01810 Encounter for preprocedural cardiovascular examination: Secondary | ICD-10-CM

## 2011-09-02 DIAGNOSIS — Z01818 Encounter for other preprocedural examination: Secondary | ICD-10-CM

## 2011-09-02 NOTE — Patient Instructions (Addendum)
Stay on your current medicines  We are going to arrange for an outpatient cardioversion next week.  We are checking labs today and you may go to [facility] when you leave here today for your chest xray. You may walk in.   You are scheduled for a cardioversion on Tuesday, August 6th at 1:00 pm with Dr. Patty Sermons or associates. Please go to [facility] 3rd Floor Short Stay at Tuesday, August 6th at 11 am.  Do not have any food or drink after midnight on Monday.  You may take your medicines with a sip of water on the day of your procedure.  You will need someone to drive you home following your procedure.   We will see you back here in about 3 weeks.  Monitor your blood pressure at home and keep a diary  Call the [facility] office at [phone] if you have any questions, problems or concerns.

## 2011-09-02 NOTE — Progress Notes (Signed)
[name] [name] Date of Birth: 07-03-1940 Medical Record #[id number]  History of Present Illness: [name] [name] is seen back today for a follow up visit. She is seen for [name] [name]. She has PAF, on Xarelto. Has had prior cardioversion and is on Flecainide. Other problems include HTN and HLD.   She comes in today. She is doing ok. Seems to be feeling better but still not where she would like to be. Still out of rhythm. Some occasional weak spells. No syncope. She has had her stress test and echo which are basically ok. She is ready to proceed on with cardioversion.   Current Outpatient Prescriptions on File Prior to Visit  Medication Sig Dispense Refill  . B Complex Vitamins (B COMPLEX PO) Take by mouth.        [name] CALCIUM PO Take by mouth.        . Calcium Polycarbophil (FIBERCON PO) Take by mouth daily.        . Coenzyme Q10 (CO Q 10 PO) Take by mouth.        Tery Sanfilippo Sodium (COLACE PO) Take by mouth daily.        [name] ezetimibe (ZETIA) 10 MG tablet Take 10 mg by mouth every other day. Taking the same day as the crestor      . flecainide (TAMBOCOR) 100 MG tablet Take 1 tablet (100 mg total) by mouth 2 (two) times daily.  60 tablet  12  . losartan (COZAAR) 25 MG tablet Take 25 mg by mouth daily.        [name] MAGNESIUM PO Take by mouth daily.        . metoprolol succinate (TOPROL-XL) 50 MG 24 hr tablet Take 25 mg by mouth daily. Take with or immediately following a meal.      . Multiple Vitamin (MULTIVITAMIN PO) Take by mouth.        . Omega-3 Fatty Acids (FISH OIL PO) Take by mouth.        . Omeprazole (PRILOSEC PO) Take by mouth daily.        . rasagiline (AZILECT) 0.5 MG TABS Take 0.5 mg by mouth daily.        . Rivaroxaban (XARELTO) 20 MG TABS Take 1 tablet (20 mg total) by mouth daily.  30 tablet  1  . rosuvastatin (CRESTOR) 5 MG tablet Take 2.5 mg by mouth every other day.        No Known Allergies  Past Medical History  Diagnosis Date  . PAF (paroxysmal atrial fibrillation)     has  had prior cardioversion in 2010; on Flecainide  . Hypertension   . Hyperlipidemia   . Leg fracture Dec 21,2011    recent   . Hip fracture 11-08-2008    Recent  . Chronic anticoagulation     on Xarelto  . Hx of cholecystectomy   . High risk medication use     on Flecainide    Past Surgical History  Procedure Date  . Gallbladder surgery   . Knee surgery     2  . Tonsillectomy   . US echocardiography 03-10-2008    Est EF 55-60%  . Cardiovascular stress test 06-06-2008    EF 73%    History  Smoking status  . Never Smoker   Smokeless tobacco  . Not on file    History  Alcohol Use  . Yes    wine x2 per day    Family History  Problem Relation Age of Onset  .  Alzheimer's disease Mother     Review of Systems: The review of systems is positive for fatigue.  All other systems were reviewed and are negative.  Physical Exam: BP 116/78  Pulse 78  Ht 5\' 2"  (1.575 m)  Wt 146 lb (66.225 kg)  BMI 26.70 kg/m2 Patient is very pleasant and in no acute distress. Skin is warm and dry. Color is normal.  HEENT is unremarkable. Normocephalic/atraumatic. PERRL. Sclera are nonicteric. Neck is supple. No masses. No JVD. Lungs are clear. Cardiac exam shows an irregular rhythm. Her rate is controlled. Abdomen is soft. Extremities are without edema. Gait and ROM are intact. No gross neurologic deficits noted.   LABORATORY DATA: EKG today shows atrial fib with a controlled VR.   Myoview Overall Impression:   Normal stress nuclear study. There is a small fixed defect in the distal anterior wall and apex that likely represents breast attenuation.  LV Ejection Fraction: 71%. LV Wall Motion: NL LV Function; NL Wall Motion   [name] Bensimhon,MD  9:24 PM   Echo Study Conclusions  - Left ventricle: The cavity size was normal. Wall thickness was normal. Systolic function was normal. The estimated ejection fraction was in the range of 50% to 55%. - Left atrium: The atrium was mildly  dilated. - Right atrium: The atrium was mildly dilated. - Pulmonary arteries: PA peak pressure: 33mm Hg (S).    Assessment / Plan:

## 2011-09-02 NOTE — Assessment & Plan Note (Addendum)
She remains in atrial fib. She has been anticoagulated sufficiently with Xarelto. No missed doses. Did have some mild LAE on her echo but hopefully she will convert. Myoview is normal. She is anxious to proceed on with repeat cardioversion. No change in her current medicines. Will plan on seeing her back in 3 weeks with a repeat EKG. Cardioversion is arranged for next Tuesday with Dr. Patty Sermons at 1:00 pm. The procedure has been reviewed in full detail and she is willing to proceed. Patient is agreeable to this plan and will call if any problems develop in the interim.

## 2011-09-03 LAB — BASIC METABOLIC PANEL
BUN: 14 mg/dL (ref 6–23)
CO2: 30 mEq/L (ref 19–32)
Calcium: 9.7 mg/dL (ref 8.4–10.5)
Chloride: 99 mEq/L (ref 96–112)
Creatinine, Ser: 0.6 mg/dL (ref 0.4–1.2)
GFR: 101.4 mL/min (ref >60.00)
Glucose, Bld: 80 mg/dL (ref 70–99)
Potassium: 4.2 mEq/L (ref 3.5–5.1)
Sodium: 137 mEq/L (ref 135–145)

## 2011-09-03 LAB — CBC WITH DIFFERENTIAL/PLATELET
Basophils Absolute: 0 10*3/uL (ref 0.0–0.1)
Basophils Relative: 0.3 % (ref 0.0–3.0)
Eosinophils Absolute: 0.1 10*3/uL (ref 0.0–0.7)
Eosinophils Relative: 1.4 % (ref 0.0–5.0)
HCT: 37.5 % (ref 36.0–46.0)
Hemoglobin: 12.6 g/dL (ref 12.0–15.0)
Lymphocytes Relative: 26.9 % (ref 12.0–46.0)
Lymphs Abs: 1.5 10*3/uL (ref 0.7–4.0)
MCHC: 33.7 g/dL (ref 30.0–36.0)
MCV: 94.6 fl (ref 78.0–100.0)
Monocytes Absolute: 0.5 10*3/uL (ref 0.1–1.0)
Monocytes Relative: 8.2 % (ref 3.0–12.0)
Neutro Abs: 3.6 10*3/uL (ref 1.4–7.7)
Neutrophils Relative %: 63.2 % (ref 43.0–77.0)
Platelets: 220 10*3/uL (ref 150.0–400.0)
RBC: 3.96 Mil/uL (ref 3.87–5.11)
RDW: 14.3 % (ref 11.5–14.6)
WBC: 5.7 10*3/uL (ref 4.5–10.5)

## 2011-09-03 LAB — PROTIME-INR
INR: 1.2 ratio — ABNORMAL HIGH (ref 0.8–1.0)
Prothrombin Time: 13.2 s — ABNORMAL HIGH (ref 10.2–12.4)

## 2011-09-03 LAB — APTT: aPTT: 30.5 s — ABNORMAL HIGH (ref 21.7–28.8)

## 2011-09-05 DIAGNOSIS — M25579 Pain in unspecified ankle and joints of unspecified foot: Secondary | ICD-10-CM | POA: Diagnosis not present

## 2011-09-05 DIAGNOSIS — M79609 Pain in unspecified limb: Secondary | ICD-10-CM | POA: Diagnosis not present

## 2011-09-08 MED ORDER — DEXTROSE-NACL 5-0.45 % IV SOLN
INTRAVENOUS | Status: DC
  Administered 2011-09-09: 500 mL via INTRAVENOUS

## 2011-09-09 ENCOUNTER — Ambulatory Visit (HOSPITAL_COMMUNITY): Payer: Medicare Other | Admitting: *Deleted

## 2011-09-09 ENCOUNTER — Encounter (HOSPITAL_COMMUNITY): Payer: Self-pay | Admitting: *Deleted

## 2011-09-09 ENCOUNTER — Ambulatory Visit (HOSPITAL_COMMUNITY)
Admission: RE | Admit: 2011-09-09 | Discharge: 2011-09-09 | Disposition: A | Discharge status: Home / Self Care | Payer: Medicare Other | Source: Ambulatory Visit | Attending: Cardiovascular Disease | Admitting: Cardiovascular Disease

## 2011-09-09 ENCOUNTER — Encounter (HOSPITAL_COMMUNITY)
Admission: RE | Disposition: A | Discharge status: Home / Self Care | Payer: Self-pay | Source: Ambulatory Visit | Attending: Cardiovascular Disease

## 2011-09-09 DIAGNOSIS — I4891 Unspecified atrial fibrillation: Secondary | ICD-10-CM | POA: Diagnosis not present

## 2011-09-09 DIAGNOSIS — Z01818 Encounter for other preprocedural examination: Secondary | ICD-10-CM | POA: Diagnosis not present

## 2011-09-09 HISTORY — DX: Gastro-esophageal reflux disease without esophagitis: K21.9

## 2011-09-09 HISTORY — PX: CARDIOVERSION: SHX1299

## 2011-09-09 HISTORY — DX: Unspecified osteoarthritis, unspecified site: M19.90

## 2011-09-09 SURGERY — CARDIOVERSION
Anesthesia: Monitor Anesthesia Care | Wound class: Clean

## 2011-09-09 MED ORDER — DEXTROSE-NACL 5-0.2 % IV SOLN
INTRAVENOUS | Status: DC | PRN
  Administered 2011-09-09: 13:00:00 via INTRAVENOUS

## 2011-09-09 MED ORDER — PROPOFOL 10 MG/ML IV BOLUS
INTRAVENOUS | Status: DC | PRN
  Administered 2011-09-09: 50 mg via INTRAVENOUS

## 2011-09-09 NOTE — Preoperative (Signed)
Beta Blockers   Reason not to administer Beta Blockers:Not Applicable 

## 2011-09-09 NOTE — Transfer of Care (Addendum)
Immediate Anesthesia Transfer of Care Note  Patient: [name] [name]  Procedure(s) Performed: Procedure(s) (LRB): CARDIOVERSION (N/A)  Patient Location: Endoscopy Unit  Anesthesia Type: General  Level of Consciousness: awake, alert  and oriented  Airway & Oxygen Therapy: Patient Spontanous Breathing  Post-op Assessment: Report given to PACU RN and Post -op Vital signs reviewed and stable  Post vital signs: Reviewed and stable  Complications: No apparent anesthesia complications

## 2011-09-09 NOTE — Anesthesia Procedure Notes (Signed)
Procedure Name: MAC Date/Time: 09/09/2011 1:13 PM Performed by: Margaree Mackintosh Pre-anesthesia Checklist: Patient identified, Emergency Drugs available, Suction available, Timeout performed and Patient being monitored Patient Re-evaluated:Patient Re-evaluated prior to inductionOxygen Delivery Method: Ambu bag Preoxygenation: Pre-oxygenation with 100% oxygen Intubation Type: IV induction Dental Injury: Teeth and Oropharynx as per pre-operative assessment

## 2011-09-09 NOTE — CV Procedure (Signed)
Electrical Cardioversion Procedure Note [name] [name] [id number] Oct 18, 1940  Procedure: Electrical Cardioversion Indications:  Atrial Fibrillation  Procedure Details Consent: Risks of procedure as well as the alternatives and risks of each were explained to the (patient/caregiver).  Consent for procedure obtained. Time Out: Verified patient identification, verified procedure, site/side was marked, verified correct patient position, special equipment/implants available, medications/allergies/relevent history reviewed, required imaging and test results available.  Performed  Patient placed on cardiac monitor, pulse oximetry, supplemental oxygen as necessary.  Sedation given: Etomidate Pacer pads placed anterior and posterior chest.  Cardioverted 1 time(s).  Cardioverted at 120J.  Evaluation Findings: Post procedure EKG shows: NSR Complications: None Patient did tolerate procedure well.   [name] [name] 09/09/2011, 1:20 PM

## 2011-09-09 NOTE — Anesthesia Preprocedure Evaluation (Addendum)
Anesthesia Evaluation  Patient identified by MRN, date of birth, ID band Patient awake    Reviewed: Allergy & Precautions, H&P , NPO status , Patient's Chart, lab work & pertinent test results, reviewed documented beta blocker date and time   Airway Mallampati: I TM Distance: >3 FB Neck ROM: Full    Dental  (+) Teeth Intact and Dental Advisory Given   Pulmonary neg pulmonary ROS,    Pulmonary exam normal       Cardiovascular hypertension, Pt. on home beta blockers and Pt. on medications + dysrhythmias Atrial Fibrillation Rhythm:Irregular     Neuro/Psych negative neurological ROS  negative psych ROS   GI/Hepatic Neg liver ROS, GERD-  Medicated and Controlled,  Endo/Other  negative endocrine ROS  Renal/GU negative Renal ROS     Musculoskeletal negative musculoskeletal ROS (+)   Abdominal Normal abdominal exam  (+)   Peds  Hematology negative hematology ROS (+)   Anesthesia Other Findings   Reproductive/Obstetrics                           Anesthesia Physical Anesthesia Plan  ASA: III  Anesthesia Plan: General   Post-op Pain Management:    Induction: Intravenous  Airway Management Planned: Mask  Additional Equipment:   Intra-op Plan:   Post-operative Plan:   Informed Consent: I have reviewed the patients History and Physical, chart, labs and discussed the procedure including the risks, benefits and alternatives for the proposed anesthesia with the patient or authorized representative who has indicated his/her understanding and acceptance.   Dental advisory given  Plan Discussed with: Anesthesiologist, Surgeon and CRNA  Anesthesia Plan Comments:        Anesthesia Quick Evaluation

## 2011-09-09 NOTE — Addendum Note (Signed)
Addended by: Vista Mink D on: 09/09/2011 04:07 PM   Modules accepted: Orders

## 2011-09-09 NOTE — Anesthesia Postprocedure Evaluation (Signed)
  Anesthesia Post-op Note  Patient: Ryhanna Dunsmore  Procedure(s) Performed: Procedure(s) (LRB): CARDIOVERSION (N/A)  Patient Location: PACU and Endoscopy Unit  Anesthesia Type: General  Level of Consciousness: awake, alert , oriented and patient cooperative  Airway and Oxygen Therapy: Patient Spontanous Breathing and Patient connected to nasal cannula oxygen  Post-op Pain: none  Post-op Assessment: Post-op Vital signs reviewed, Patient's Cardiovascular Status Stable, Respiratory Function Stable, Patent Airway, No signs of Nausea or vomiting and Pain level controlled  Post-op Vital Signs: stable  Complications: No apparent anesthesia complications

## 2011-09-09 NOTE — Progress Notes (Signed)
1313 Cardioversion performed 120 joules-NSR.

## 2011-09-10 ENCOUNTER — Encounter (HOSPITAL_COMMUNITY): Payer: Self-pay | Admitting: Cardiology

## 2011-09-10 ENCOUNTER — Telehealth: Payer: Self-pay | Admitting: Nurse Practitioner

## 2011-09-10 DIAGNOSIS — I4891 Unspecified atrial fibrillation: Secondary | ICD-10-CM

## 2011-09-10 NOTE — Telephone Encounter (Signed)
Alvino Chapel,  Can you call and speak with her. She is going to need to see Dr. Elease Hashimoto and discuss other options. She needs to stay on her anticoagulation.

## 2011-09-10 NOTE — Telephone Encounter (Signed)
Pt has f/u with Lawson Fiscal gerhardt np, pt on anticoagulants.

## 2011-09-10 NOTE — Telephone Encounter (Signed)
Pt had cardioversion yesterday , still in a-fib

## 2011-09-10 NOTE — Telephone Encounter (Signed)
Patient returning nurse call she can be reached at [phone] available after 2:30Pm

## 2011-09-10 NOTE — Telephone Encounter (Signed)
msg left to call back and ask for me/ number provided.

## 2011-09-10 NOTE — Telephone Encounter (Signed)
Probably need to go ahead and get her a consult visit with EP.

## 2011-09-10 NOTE — Telephone Encounter (Signed)
Referral order placed/ forward to [facility] and staff msg placed. Pt informed of referral.

## 2011-09-11 NOTE — Telephone Encounter (Signed)
Patient has an appointment with Dr. Johney Frame 10/09/11 @ 3 pm.

## 2011-09-11 NOTE — Telephone Encounter (Signed)
noted 

## 2011-09-12 ENCOUNTER — Other Ambulatory Visit: Payer: Self-pay | Admitting: Nurse Practitioner

## 2011-09-12 NOTE — H&P (Signed)
[name] [name]   09/02/2011 2:30 PM Office Visit  MRN: [id number]   Description: 71 year old female  Provider: Norma Fredrickson, NP  Department: [facility]        Referring Provider     Ezequiel Kayser, MD      Diagnoses     Atrial fibrillation   - Primary    427.31    Pre-operative cardiovascular examination     V72.81    A-fib     427.31      Reason for Visit     Fatigue    Follow up visit. Seen for Dr. Elease Hashimoto.     Atrial Fibrillation         Progress Notes     Norma Fredrickson, NP  09/02/2011  3:21 PM  Signed    [name] Date of Birth: December 13, 1940 Medical Record #[id number]   History of Present Illness: [name] [name] is seen back today for a follow up visit. She is seen for Dr. Elease Hashimoto. She has PAF, on Xarelto. Has had prior cardioversion and is on Flecainide. Other problems include HTN and HLD.    She comes in today. She is doing ok. Seems to be feeling better but still not where she would like to be. Still out of rhythm. Some occasional weak spells. No syncope. She has had her stress test and echo which are basically ok. She is ready to proceed on with cardioversion.     Current Outpatient Prescriptions on File Prior to Visit   Medication  Sig  Dispense  Refill   .  B Complex Vitamins (B COMPLEX PO)  Take by mouth.           [name]  CALCIUM PO  Take by mouth.           .  Calcium Polycarbophil (FIBERCON PO)  Take by mouth daily.           .  Coenzyme Q10 (CO Q 10 PO)  Take by mouth.           Tery Sanfilippo Sodium (COLACE PO)  Take by mouth daily.           [name]  ezetimibe (ZETIA) 10 MG tablet  Take 10 mg by mouth every other day. Taking the same day as the crestor         .  flecainide (TAMBOCOR) 100 MG tablet  Take 1 tablet (100 mg total) by mouth 2 (two) times daily.   60 tablet   12   .  losartan (COZAAR) 25 MG tablet  Take 25 mg by mouth daily.           [name]  MAGNESIUM PO  Take by mouth daily.           .  metoprolol succinate (TOPROL-XL) 50 MG 24 hr  tablet  Take 25 mg by mouth daily. Take with or immediately following a meal.         .  Multiple Vitamin (MULTIVITAMIN PO)  Take by mouth.           .  Omega-3 Fatty Acids (FISH OIL PO)  Take by mouth.           .  Omeprazole (PRILOSEC PO)  Take by mouth daily.           .  rasagiline (AZILECT) 0.5 MG TABS  Take 0.5 mg by mouth daily.           [name]  Rivaroxaban (XARELTO) 20 MG TABS  Take 1 tablet (20 mg total) by mouth daily.   30 tablet   1   .  rosuvastatin (CRESTOR) 5 MG tablet  Take 2.5 mg by mouth every other day.            No Known Allergies    Past Medical History   Diagnosis  Date   .  PAF (paroxysmal atrial fibrillation)         has had prior cardioversion in 2010; on Flecainide   .  Hypertension     .  Hyperlipidemia     .  Leg fracture  Dec 21,2011       recent    .  Hip fracture  11-08-2008       Recent   .  Chronic anticoagulation         on Xarelto   .  Hx of cholecystectomy     .  High risk medication use         on Flecainide       Past Surgical History   Procedure  Date   .  Gallbladder surgery     .  Knee surgery         2   .  Tonsillectomy     .  US echocardiography  03-10-2008       Est EF 55-60%   .  Cardiovascular stress test  06-06-2008       EF 73%       History   Smoking status   .  Never Smoker    Smokeless tobacco   .  Not on file       History   Alcohol Use   .  Yes       wine x2 per day       Family History   Problem  Relation  Age of Onset   .  Alzheimer's disease  Mother        Review of Systems: The review of systems is positive for fatigue.  All other systems were reviewed and are negative.   Physical Exam: BP 116/78  Pulse 78  Ht 5\' 2"  (1.575 m)  Wt 146 lb (66.225 kg)  BMI 26.70 kg/m2 Patient is very pleasant and in no acute distress. Skin is warm and dry. Color is normal.  HEENT is unremarkable. Normocephalic/atraumatic. PERRL. Sclera are nonicteric. Neck is supple. No masses. No JVD. Lungs are clear. Cardiac exam  shows an irregular rhythm. Her rate is controlled. Abdomen is soft. Extremities are without edema. Gait and ROM are intact. No gross neurologic deficits noted.     LABORATORY DATA: EKG today shows atrial fib with a controlled VR.    Myoview Overall Impression:    Normal stress nuclear study. There is a small fixed defect in the distal anterior wall and apex that likely represents breast attenuation.   LV Ejection Fraction: 71%. LV Wall Motion: NL LV Function; NL Wall Motion    Daniel Bensimhon,MD   9:24 PM     Echo Study Conclusions  - Left ventricle: The cavity size was normal. Wall thickness was normal. Systolic function was normal. The estimated ejection fraction was in the range of 50% to 55%. - Left atrium: The atrium was mildly dilated. - Right atrium: The atrium was mildly dilated. - Pulmonary arteries: PA peak pressure: 33mm Hg (S).       Assessment / Plan:        A-fib -  Norma Fredrickson, NP  09/02/2011  3:20 PM  Addendum She remains in atrial fib. She has been anticoagulated sufficiently with Xarelto. No missed doses. Did have some mild LAE on her echo but hopefully she will convert. Myoview is normal. She is anxious to proceed on with repeat cardioversion. No change in her current medicines. Will plan on seeing her back in 3 weeks with a repeat EKG. Cardioversion is arranged for next Tuesday with Dr. Patty Sermons at 1:00 pm. The procedure has been reviewed in full detail and she is willing to proceed. Patient is agreeable to this plan and will call if any problems develop in the interim.      Previous Version      Electronic signature on 09/02/2011          Vitals - Last Recorded       BP Pulse Ht Wt BMI      116/78 78 5\' 2"  (1.575 m) 146 lb (66.225 kg) 26.70 kg/m2         Orders Placed This Encounter     EKG 12-Lead [EKG1 Custom]    Basic metabolic panel [LAB15 Custom]    CBC with Differential [ZOX0960 Custom]    APTT [LAB325 Custom]    Protime-INR  [LAB320 Custom]         Future Orders Expected By Expires      DG Chest 2 View [facility] Custom] 09/03/11 11/01/12         Results are available for this encounter           Level of Service     PR OFFICE OUTPATIENT VISIT 15 MINUTES [99213]      Follow-up and Disposition     Return in about 3 weeks (around 09/23/2011) for OV & EKG with Lawson Fiscal.       Routing History Recorded        All Charges for This Encounter       Code Description Service Date Service Provider Modifiers Quantity    [phone] PR OFFICE OUTPATIENT VISIT 15 MINUTES 09/02/2011 Rosalio Macadamia, NP   1    [phone] CHG BASIC METABOLIC PANEL CALCIUM TOTAL 09/02/2011 Rosalio Macadamia, NP   1    [phone] CHG COMPLETE CBC & AUTO DIFF WBC 09/02/2011 Rosalio Macadamia, NP   1    [phone] CHG [facility] TIME PARTIAL 09/02/2011 Rosalio Macadamia, NP   1    [phone] CHG PROTHROMBIN TIME 09/02/2011 Rosalio Macadamia, NP   1    [phone] PR COLLECTION VENOUS BLOOD,VENIPUNCTURE 09/02/2011 Rosalio Macadamia, NP   1    93000 PR ELECTROCARDIOGRAM, COMPLETE 09/02/2011 Rosalio Macadamia, NP   1      Patient Instructions     Stay on your current medicines   We are going to arrange for an outpatient cardioversion next week.   We are checking labs today and you may go to [facility] when you leave here today for your chest xray. You may walk in.    You are scheduled for a cardioversion on Tuesday, August 6th at 1:00 pm with Dr. Patty Sermons or associates. Please go to [facility] 3rd Floor Short Stay at Tuesday, August 6th at 11 am.   Do not have any food or drink after midnight on Monday.   You may take your medicines with a sip of water on the day of your procedure.   You will need someone to drive you home following your procedure.  We will see you back here in about 3 weeks.   Monitor your blood pressure at home and keep a diary   Call the [facility] office at [phone] if you have any questions, problems or  concerns.              Patient Instructions History Recorded       Chart Reviewed By     Vesta Mixer, MD  on 09/04/2011  6:41 PM        Previous Visit         Provider Department Encounter #    08/27/2011  7:45 AM Norma Fredrickson, NP [facility] [id number]

## 2011-09-18 DIAGNOSIS — H1030 Unspecified acute conjunctivitis, unspecified eye: Secondary | ICD-10-CM | POA: Diagnosis not present

## 2011-09-19 NOTE — H&P (Signed)
   [name] [name]   09/02/2011 2:30 PM Office Visit  MRN: [id number]   Description: 71 year old female  Provider: Kiah Keay, NP  Department: [facility]        Referring Provider     [name] [name], MD      Diagnoses     Atrial fibrillation   - Primary    427.31    Pre-operative cardiovascular examination     V72.81    A-fib     427.31      Reason for Visit     Fatigue    Follow up visit. Seen for Dr. Nahser.     Atrial Fibrillation         Progress Notes     [name] Kaufman, NP  09/02/2011  3:21 PM  Signed    [name] [name] Date of Birth: 03/16/1940 Medical Record #[id number]   History of Present Illness: [name] [name] is seen back today for a follow up visit. She is seen for Dr. Nahser. She has PAF, on Xarelto. Has had prior cardioversion and is on Flecainide. Other problems include HTN and HLD.    She comes in today. She is doing ok. Seems to be feeling better but still not where she would like to be. Still out of rhythm. Some occasional weak spells. No syncope. She has had her stress test and echo which are basically ok. She is ready to proceed on with cardioversion.     Current Outpatient Prescriptions on File Prior to Visit   Medication  Sig  Dispense  Refill   .  B Complex Vitamins (B COMPLEX PO)  Take by mouth.           .  CALCIUM PO  Take by mouth.           .  Calcium Polycarbophil (FIBERCON PO)  Take by mouth daily.           .  Coenzyme Q10 (CO Q 10 PO)  Take by mouth.           .  Docusate Sodium (COLACE PO)  Take by mouth daily.           .  ezetimibe (ZETIA) 10 MG tablet  Take 10 mg by mouth every other day. Taking the same day as the crestor         .  flecainide (TAMBOCOR) 100 MG tablet  Take 1 tablet (100 mg total) by mouth 2 (two) times daily.   60 tablet   12   .  losartan (COZAAR) 25 MG tablet  Take 25 mg by mouth daily.           .  MAGNESIUM PO  Take by mouth daily.           .  metoprolol succinate (TOPROL-XL) 50 MG 24 hr  tablet  Take 25 mg by mouth daily. Take with or immediately following a meal.         .  Multiple Vitamin (MULTIVITAMIN PO)  Take by mouth.           .  Omega-3 Fatty Acids (FISH OIL PO)  Take by mouth.           .  Omeprazole (PRILOSEC PO)  Take by mouth daily.           .  rasagiline (AZILECT) 0.5 MG TABS  Take 0.5 mg by mouth daily.           .    Rivaroxaban (XARELTO) 20 MG TABS  Take 1 tablet (20 mg total) by mouth daily.   30 tablet   1   .  rosuvastatin (CRESTOR) 5 MG tablet  Take 2.5 mg by mouth every other day.            No Known Allergies    Past Medical History   Diagnosis  Date   .  PAF (paroxysmal atrial fibrillation)         has had prior cardioversion in 2010; on Flecainide   .  Hypertension     .  Hyperlipidemia     .  Leg fracture  Dec 21,2011       recent    .  Hip fracture  11-08-2008       Recent   .  Chronic anticoagulation         on Xarelto   .  Hx of cholecystectomy     .  High risk medication use         on Flecainide       Past Surgical History   Procedure  Date   .  Gallbladder surgery     .  Knee surgery         2   .  Tonsillectomy     .  Us echocardiography  03-10-2008       Est EF 55-60%   .  Cardiovascular stress test  06-06-2008       EF 73%       History   Smoking status   .  Never Smoker    Smokeless tobacco   .  Not on file       History   Alcohol Use   .  Yes       wine x2 per day       Family History   Problem  Relation  Age of Onset   .  Alzheimer's disease  Mother        Review of Systems: The review of systems is positive for fatigue.  All other systems were reviewed and are negative.   Physical Exam: BP 116/78  Pulse 78  Ht 5' 2" (1.575 m)  Wt 146 lb (66.225 kg)  BMI 26.70 kg/m2 Patient is very pleasant and in no acute distress. Skin is warm and dry. Color is normal.  HEENT is unremarkable. Normocephalic/atraumatic. PERRL. Sclera are nonicteric. Neck is supple. No masses. No JVD. Lungs are clear. Cardiac exam  shows an irregular rhythm. Her rate is controlled. Abdomen is soft. Extremities are without edema. Gait and ROM are intact. No gross neurologic deficits noted.     LABORATORY DATA: EKG today shows atrial fib with a controlled VR.    Myoview Overall Impression:    Normal stress nuclear study. There is a small fixed defect in the distal anterior wall and apex that likely represents breast attenuation.   LV Ejection Fraction: 71%. LV Wall Motion: NL LV Function; NL Wall Motion    Daniel Bensimhon,MD   9:24 PM     Echo Study Conclusions  - Left ventricle: The cavity size was normal. Wall thickness was normal. Systolic function was normal. The estimated ejection fraction was in the range of 50% to 55%. - Left atrium: The atrium was mildly dilated. - Right atrium: The atrium was mildly dilated. - Pulmonary arteries: PA peak pressure: 33mm Hg (S).       Assessment / Plan:        A-fib -   [name] Seeman, NP  09/02/2011  3:20 PM  Addendum She remains in atrial fib. She has been anticoagulated sufficiently with Xarelto. No missed doses. Did have some mild LAE on her echo but hopefully she will convert. Myoview is normal. She is anxious to proceed on with repeat cardioversion. No change in her current medicines. Will plan on seeing her back in 3 weeks with a repeat EKG. Cardioversion is arranged for next Tuesday with Dr. Brackbill at 1:00 pm. The procedure has been reviewed in full detail and she is willing to proceed. Patient is agreeable to this plan and will call if any problems develop in the interim.      Previous Version      Electronic signature on 09/02/2011          Vitals - Last Recorded       BP Pulse Ht Wt BMI      116/78 78 5' 2" (1.575 m) 146 lb (66.225 kg) 26.70 kg/m2         Orders Placed This Encounter     EKG 12-Lead [EKG1 Custom]    Basic metabolic panel [LAB15 Custom]    CBC with Differential [LAB1748 Custom]    APTT [LAB325 Custom]    Protime-INR  [LAB320 Custom]         Future Orders Expected By Expires      DG Chest 2 View [IMG36 Custom] 09/03/11 11/01/12         Results are available for this encounter           Level of Service     PR OFFICE OUTPATIENT VISIT 15 MINUTES [99213]      Follow-up and Disposition     Return in about 3 weeks (around 09/23/2011) for OV & EKG with Kyjuan Gause.       Routing History Recorded        All Charges for This Encounter       Code Description Service Date Service Provider Modifiers Quantity    99213 PR OFFICE OUTPATIENT VISIT 15 MINUTES 09/02/2011 Shigeo Baugh C Kaila Devries, NP   1    80048 CHG BASIC METABOLIC PANEL CALCIUM TOTAL 09/02/2011 Alfie Rideaux C Bulmaro Feagans, NP   1    85025 CHG COMPLETE CBC & AUTO DIFF WBC 09/02/2011 Nikalas Bramel C Dilan Novosad, NP   1    85730 CHG THROMBOPLAS TIME PARTIAL 09/02/2011 Briggett Tuccillo C Rubens Cranston, NP   1    85610 CHG PROTHROMBIN TIME 09/02/2011 Jayani Rozman C Makailah Slavick, NP   1    36415 PR COLLECTION VENOUS BLOOD,VENIPUNCTURE 09/02/2011 Madaline Lefeber C Earla Charlie, NP   1    93000 PR ELECTROCARDIOGRAM, COMPLETE 09/02/2011 Jaaziel Peatross C Quanah Majka, NP   1      Patient Instructions     Stay on your current medicines   We are going to arrange for an outpatient cardioversion next week.   We are checking labs today and you may go to [facility] when you leave here today for your chest xray. You may walk in.    You are scheduled for a cardioversion on Tuesday, August 6th at 1:00 pm with Dr. Brackbill or associates. Please go to [place] 3rd Floor Short Stay at Tuesday, August 6th at 11 am.   Do not have any food or drink after midnight on Monday.   You may take your medicines with a sip of water on the day of your procedure.   You will need someone to drive you home following your procedure.      We will see you back here in about 3 weeks.   Monitor your blood pressure at home and keep a diary   Call the [facility] office at [phone] if you have any questions, problems or  concerns.              Patient Instructions History Recorded       Chart Reviewed By     Philip J Nahser, MD  on 09/04/2011  6:41 PM        Previous Visit         Provider Department Encounter #    08/27/2011  7:45 AM Darrel Gloss, NP [facility] [id number]              

## 2011-09-19 NOTE — H&P (Signed)
[name] [name]  Date of Birth: 1940-04-29  Medical Record #[id number]  History of Present Illness:  Ms. [name] [name] is seen back today for a follow up visit. She is seen for Dr. Elease Hashimoto. She has PAF, on Xarelto. Has had prior cardioversion and is on Flecainide. Other problems include HTN and HLD.  She comes in today. She is doing ok. Seems to be feeling better but still not where she would like to be. Still out of rhythm. Some occasional weak spells. No syncope. She has had her stress test and echo which are basically ok. She is ready to proceed on with cardioversion.  Current Outpatient Prescriptions on File Prior to Visit  Medication Sig Dispense Refill  . B Complex Vitamins (B COMPLEX PO) Take by mouth.  [name] CALCIUM PO Take by mouth.  . Calcium Polycarbophil (FIBERCON PO) Take by mouth daily.  . Coenzyme Q10 (CO Q 10 PO) Take by mouth.  Tery Sanfilippo Sodium (COLACE PO) Take by mouth daily.  [name] ezetimibe (ZETIA) 10 MG tablet Take 10 mg by mouth every other day. Taking the same day as the crestor  . flecainide (TAMBOCOR) 100 MG tablet Take 1 tablet (100 mg total) by mouth 2 (two) times daily. 60 tablet 12  . losartan (COZAAR) 25 MG tablet Take 25 mg by mouth daily.  [name] MAGNESIUM PO Take by mouth daily.  . metoprolol succinate (TOPROL-XL) 50 MG 24 hr tablet Take 25 mg by mouth daily. Take with or immediately following a meal.  . Multiple Vitamin (MULTIVITAMIN PO) Take by mouth.  . Omega-3 Fatty Acids (FISH OIL PO) Take by mouth.  . Omeprazole (PRILOSEC PO) Take by mouth daily.  . rasagiline (AZILECT) 0.5 MG TABS Take 0.5 mg by mouth daily.  . Rivaroxaban (XARELTO) 20 MG TABS Take 1 tablet (20 mg total) by mouth daily. 30 tablet 1  . rosuvastatin (CRESTOR) 5 MG tablet Take 2.5 mg by mouth every other day.  No Known Allergies  Past Medical History  Diagnosis Date  . PAF (paroxysmal atrial fibrillation)  has had prior cardioversion in 2010; on Flecainide  . Hypertension  . Hyperlipidemia  . Leg  fracture Dec 21,2011  recent  . Hip fracture 11-08-2008  Recent  . Chronic anticoagulation  on Xarelto  . Hx of cholecystectomy  . High risk medication use  on Flecainide  Past Surgical History  Procedure Date  . Gallbladder surgery  . Knee surgery  2  . Tonsillectomy  . US echocardiography 03-10-2008  Est EF 55-60%  . Cardiovascular stress test 06-06-2008  EF 73%  History  Smoking status  . Never Smoker  Smokeless tobacco  . Not on file  History  Alcohol Use  . Yes  wine x2 per day  Family History  Problem Relation Age of Onset  . Alzheimer's disease Mother  Review of Systems:  The review of systems is positive for fatigue. All other systems were reviewed and are negative.  Physical Exam:  BP 116/78  Pulse 78  Ht 5\' 2"  (1.575 m)  Wt 146 lb (66.225 kg)  BMI 26.70 kg/m2  Patient is very pleasant and in no acute distress. Skin is warm and dry. Color is normal. HEENT is unremarkable. Normocephalic/atraumatic. PERRL. Sclera are nonicteric. Neck is supple. No masses. No JVD. Lungs are clear. Cardiac exam shows an irregular rhythm. Her rate is controlled. Abdomen is soft. Extremities are without edema. Gait and ROM are intact. No gross neurologic deficits noted.  LABORATORY DATA: EKG today shows atrial  fib with a controlled VR.  Myoview Overall Impression:  Normal stress nuclear study. There is a small fixed defect in the distal anterior wall and apex that likely represents breast attenuation.  LV Ejection Fraction: 71%. LV Wall Motion: NL LV Function; NL Wall Motion  Daniel Bensimhon,MD  9:24 PM  Echo Study Conclusions  - Left ventricle: The cavity size was normal. Wall thickness  was normal. Systolic function was normal. The estimated  ejection fraction was in the range of 50% to 55%.  - Left atrium: The atrium was mildly dilated.  - Right atrium: The atrium was mildly dilated.  - Pulmonary arteries: PA peak pressure: 33mm Hg (S).  Assessment / Plan:  A-fib Norma Fredrickson, NP 09/02/2011 3:20 PM Addendum  She remains in atrial fib. She has been anticoagulated sufficiently with Xarelto. No missed doses. Did have some mild LAE on her echo but hopefully she will convert. Myoview is normal. She is anxious to proceed on with repeat cardioversion. No change in her current medicines. Will plan on seeing her back in 3 weeks with a repeat EKG. Cardioversion is arranged for next Tuesday with Dr. Patty Sermons at 1:00 pm. The procedure has been reviewed in full detail and she is willing to proceed. Patient is agreeable to this plan and will call if any problems develop in the interim.   Norma Fredrickson, NP 09/02/2011 3:20 PM   Vesta Mixer, Montez Hageman., MD, [facility] 09/19/2011, 6:07 PM Office - [phone] Pager [phone]

## 2011-09-23 ENCOUNTER — Ambulatory Visit: Payer: Medicare Other | Admitting: Nurse Practitioner

## 2011-09-26 ENCOUNTER — Ambulatory Visit (INDEPENDENT_AMBULATORY_CARE_PROVIDER_SITE_OTHER): Payer: Medicare Other | Admitting: Nurse Practitioner

## 2011-09-26 ENCOUNTER — Encounter: Payer: Self-pay | Admitting: Nurse Practitioner

## 2011-09-26 VITALS — BP 114/68 | HR 67 | Ht 62.0 in | Wt 145.0 lb

## 2011-09-26 DIAGNOSIS — S7290XA Unspecified fracture of unspecified femur, initial encounter for closed fracture: Secondary | ICD-10-CM | POA: Diagnosis not present

## 2011-09-26 DIAGNOSIS — Z8679 Personal history of other diseases of the circulatory system: Secondary | ICD-10-CM | POA: Diagnosis not present

## 2011-09-26 DIAGNOSIS — I4891 Unspecified atrial fibrillation: Secondary | ICD-10-CM | POA: Diagnosis not present

## 2011-09-26 DIAGNOSIS — G20 Parkinson's disease: Secondary | ICD-10-CM | POA: Diagnosis not present

## 2011-09-26 MED ORDER — RIVAROXABAN 20 MG PO TABS: 20.0000 mg | ORAL_TABLET | Freq: Every day | ORAL | Status: DC

## 2011-09-26 NOTE — Progress Notes (Signed)
[name] [name] Date of Birth: 27-Jul-1940 Medical Record #[id number]  History of Present Illness: [name] [name] is seen back today for a 3 week check. She is seen for Dr. Elease Hashimoto. She has PAF, on Xarelto and Flecainide. Other problems include HTN, HLD and Parkinson's. She was recently cardioverted due to recurrent atrial fib.   She comes in today. She is here with her husband. She notes that she had very short lived results from the cardioversion. She was back in atrial fib by that night following her procedure. She has been referred to EP for consideration of an ablation. She is quite worried about the long term consequences of being on anticoagulation, having Parkinson's and the potential for falls. She saw Dr. Sandria Manly earlier today and was put on a new Parkinson's medicine but she does not know the name. She can tell that she is out of rhythm. Makes her tired and a little short of breath.   Current Outpatient Prescriptions on File Prior to Visit  Medication Sig Dispense Refill  . B Complex Vitamins (B COMPLEX PO) Take by mouth.        [name] CALCIUM PO Take by mouth.        . Calcium Polycarbophil (FIBERCON PO) Take by mouth daily.        . Coenzyme Q10 (CO Q 10 PO) Take by mouth.        Tery Sanfilippo Sodium (COLACE PO) Take by mouth daily.        [name] ezetimibe (ZETIA) 10 MG tablet Take 10 mg by mouth every other day. Taking the same day as the crestor      . flecainide (TAMBOCOR) 100 MG tablet Take 1 tablet (100 mg total) by mouth 2 (two) times daily.  60 tablet  12  . losartan (COZAAR) 25 MG tablet Take 25 mg by mouth daily.        [name] MAGNESIUM PO Take by mouth daily.        . metoprolol succinate (TOPROL-XL) 50 MG 24 hr tablet Take 25 mg by mouth daily. Take with or immediately following a meal.      . Multiple Vitamin (MULTIVITAMIN PO) Take by mouth.        . Omega-3 Fatty Acids (FISH OIL PO) Take by mouth.        . Omeprazole (PRILOSEC PO) Take by mouth daily.        . rasagiline (AZILECT) 0.5 MG  TABS Take 0.5 mg by mouth daily.        . rosuvastatin (CRESTOR) 5 MG tablet Take 2.5 mg by mouth every other day.      [name] DISCONTD: Rivaroxaban (XARELTO) 20 MG TABS Take 1 tablet (20 mg total) by mouth daily.  30 tablet  1    No Known Allergies  Past Medical History  Diagnosis Date  . PAF (paroxysmal atrial fibrillation)     has had prior cardioversion in 2010; on Flecainide  . Hypertension   . Hyperlipidemia   . Leg fracture Dec 21,2011    recent   . Hip fracture 11-08-2008    Recent  . Chronic anticoagulation     on Xarelto  . Hx of cholecystectomy   . High risk medication use     on Flecainide  . GERD (gastroesophageal reflux disease)   . Neuromuscular disorder     parkinson  . Arthritis     lt thumb    Past Surgical History  Procedure Date  . Gallbladder surgery   .  Knee surgery     2  . Tonsillectomy   . US echocardiography 03-10-2008    Est EF 55-60%  . Cardiovascular stress test 06-06-2008    EF 73%  . Cholecystectomy   . Tonsillectomy   . Wrist surgery   . Cardioversion 09/09/2011    Procedure: CARDIOVERSION;  Surgeon: Cassell Clement, MD;  Location: [facility] ENDOSCOPY;  Service: Cardiovascular;  Laterality: N/A;  to be done by dr. Patty Sermons    History  Smoking status  . Never Smoker   Smokeless tobacco  . Not on file    History  Alcohol Use  . 4.8 oz/week  . 8 Glasses of wine per week    wine x2 per day    Family History  Problem Relation Age of Onset  . Alzheimer's disease Mother     Review of Systems: The review of systems is per the HPI.  All other systems were reviewed and are negative.  Physical Exam: BP 114/68  Pulse 67  Ht 5\' 2"  (1.575 m)  Wt 145 lb (65.772 kg)  BMI 26.52 kg/m2 Patient is very pleasant and in no acute distress. Skin is warm and dry. Color is normal.  HEENT is unremarkable. Normocephalic/atraumatic. PERRL. Sclera are nonicteric. Neck is supple. No masses. No JVD. Lungs are clear. Cardiac exam shows an irregular rhythm. Her  rate is controlled. Abdomen is soft. Extremities are without edema. Gait and ROM are intact. No gross neurologic deficits noted.   LABORATORY DATA: EKG today shows atrial fib with a controlled ventricular response.   Assessment / Plan: 1. PAF - on Xarelto and has been on long term Flecainide as well. Has been referred to EP for consideration of ablation.   2. Chronic anticoagulation - on Xarelto. She is worried about long term issues with falling in the setting of having Parkinson's and would prefer to not take anticoagulation.   She will see EP after Labor Day. She is scheduled to see Dr. Elease Hashimoto later next month as well. For now, no change in her medicines. Patient is agreeable to this plan and will call if any problems develop in the interim.

## 2011-09-26 NOTE — Patient Instructions (Addendum)
Stay on your current medicines  Keep your next appointment with Dr. Johney Frame and Dr. Elease Hashimoto  Call the [facility] office at [phone] if you have any questions, problems or concerns.

## 2011-10-09 ENCOUNTER — Ambulatory Visit (INDEPENDENT_AMBULATORY_CARE_PROVIDER_SITE_OTHER): Payer: Medicare Other | Admitting: Internal Medicine

## 2011-10-09 ENCOUNTER — Encounter: Payer: Self-pay | Admitting: Internal Medicine

## 2011-10-09 ENCOUNTER — Telehealth: Payer: Self-pay | Admitting: Internal Medicine

## 2011-10-09 VITALS — BP 98/78 | HR 70 | Ht 62.0 in | Wt 149.0 lb

## 2011-10-09 DIAGNOSIS — I10 Essential (primary) hypertension: Secondary | ICD-10-CM

## 2011-10-09 DIAGNOSIS — I4891 Unspecified atrial fibrillation: Secondary | ICD-10-CM

## 2011-10-09 NOTE — Telephone Encounter (Signed)
Changed to 11/11/11

## 2011-10-09 NOTE — Patient Instructions (Addendum)
Your physician has recommended that you have an ablation. Catheter ablation is a medical procedure used to treat some cardiac arrhythmias (irregular heartbeats). During catheter ablation, a long, thin, flexible tube is put into a blood vessel in your groin (upper thigh), or neck. This tube is called an ablation catheter. It is then guided to your heart through the blood vessel. Radio frequency waves destroy small areas of heart tissue where abnormal heartbeats may cause an arrhythmia to start. Please see the instruction sheet given to you today.  Your physician has recommended you make the following change in your medication:  1) Stop Flecainide

## 2011-10-09 NOTE — Progress Notes (Signed)
Primary Care Physician: Ezequiel Kayser, MD Referring Physician:  Dr [name] [name] is a 71 y.o. female with a h/o atrial fibrillation who presents today for EP consultation.  She reports initially being diagnosed with atrial fibrillation January 2006 after presenting to Dr Waynard Edwards with tachypalpitations following a viral GI illness.  She was initiated on anticoagulation and cardioverted in 2006.  She returned to afib several months later and was again cardioverted.  She was treated with metoprolol and maintained sinus rhythm for over a year.  She was evaluated and [facility] and initiated on flecainide.  She did "great" on flecainide for several years.  In 2010 she developed recurrent afib and required another cardioverted.  She did well until June 2013 when she developed recurrent afib while at the beach.  She was initiated on xarelto and underwent unsuccessful cardioversion 09/12/11.  She remains in afib since that time.  She reports symptoms of palpitations, fatigue and decreased exercise tolerance.    Today, she denies symptoms of chest pain, orthopnea, PND, lower extremity edema, dizziness, presyncope, or syncope. The patient is tolerating medications without difficulties and is otherwise without complaint today.   Past Medical History  Diagnosis Date  . PAF (paroxysmal atrial fibrillation)     has had prior cardioversion in 2010; on Flecainide  . Hypertension   . Hyperlipidemia   . Leg fracture Dec 21,2011    recent   . Hip fracture 11-08-2008    Recent  . Chronic anticoagulation     on Xarelto  . High risk medication use     on Flecainide  . GERD (gastroesophageal reflux disease)   . Parkinson's disease   . Arthritis     lt thumb   Past Surgical History  Procedure Date  . Knee surgery     2  . US echocardiography 03-10-2008    Est EF 55-60%  . Cardiovascular stress test 06-06-2008    EF 73%  . Cholecystectomy   . Tonsillectomy   . Wrist surgery   .  Cardioversion 09/09/2011    Procedure: CARDIOVERSION;  Surgeon: Cassell Clement, MD;  Location: [facility] ENDOSCOPY;  Service: Cardiovascular;  Laterality: N/A;  to be done by dr. Patty Sermons    Current Outpatient Prescriptions  Medication Sig Dispense Refill  . B Complex Vitamins (B COMPLEX PO) Take by mouth.        [name] CALCIUM PO Take by mouth.        . Calcium Polycarbophil (FIBERCON PO) Take by mouth daily.        . Coenzyme Q10 (CO Q 10 PO) Take by mouth.        Tery Sanfilippo Sodium (COLACE PO) Take by mouth daily.        [name] ezetimibe (ZETIA) 10 MG tablet Take 10 mg by mouth every other day. Taking the same day as the crestor      . flecainide (TAMBOCOR) 100 MG tablet Take 1 tablet (100 mg total) by mouth 2 (two) times daily.  60 tablet  12  . losartan (COZAAR) 25 MG tablet Take 25 mg by mouth daily.        [name] MAGNESIUM PO Take by mouth daily.        . metoprolol succinate (TOPROL-XL) 50 MG 24 hr tablet Take 25 mg by mouth daily. Take with or immediately following a meal.      . Multiple Vitamin (MULTIVITAMIN PO) Take by mouth.        . Omega-3  Fatty Acids (FISH OIL PO) Take by mouth.        . Omeprazole (PRILOSEC PO) Take by mouth daily.        . rasagiline (AZILECT) 0.5 MG TABS Take 0.5 mg by mouth daily.        . Rivaroxaban (XARELTO) 20 MG TABS Take 1 tablet (20 mg total) by mouth daily.  30 tablet  3  . rosuvastatin (CRESTOR) 5 MG tablet Take 2.5 mg by mouth every other day.      . Vitamin D, Ergocalciferol, (DRISDOL) 50000 UNITS CAPS       . trihexyphenidyl (ARTANE) 2 MG tablet Patient has not started yet        No Known Allergies  History   Social History  . Marital Status: Married    Spouse Name: N/A    Number of Children: N/A  . Years of Education: N/A   Occupational History  . Not on file.   Social History Main Topics  . Smoking status: Never Smoker   . Smokeless tobacco: Not on file  . Alcohol Use: 4.8 oz/week    8 Glasses of wine per week     wine x2 per day  . Drug Use:  No  . Sexually Active: Not Currently   Other Topics Concern  . Not on file   Social History Narrative   Lives in [place].  Retired Comptroller.    Family History  Problem Relation Age of Onset  . Alzheimer's disease Mother     ROS- All systems are reviewed and negative except as per the HPI above  Physical Exam: Filed Vitals:   10/09/11 1505  BP: 98/78  Pulse: 70  Height: 5\' 2"  (1.575 m)  Weight: 149 lb (67.586 kg)    GEN- The patient is well appearing, alert and oriented x 3 today.   Head- normocephalic, atraumatic Eyes-  Sclera clear, conjunctiva pink Ears- hearing intact Oropharynx- clear Neck- supple, no JVP Lymph- no cervical lymphadenopathy Lungs- Clear to ausculation bilaterally, normal work of breathing Heart- irregular rate and rhythm, no murmurs, rubs or gallops, PMI not laterally displaced GI- soft, NT, ND, + BS Extremities- no clubbing, cyanosis, or edema MS- no significant deformity or atrophy Skin- no rash or lesion Psych- euthymic mood, full affect Neuro- resting parkinsons tremor  EKG 09/10/11 reveals afib, V rate 63 bpm, QTc 464 EKG 08/30/10 reveals sinus bradycardia 42 bpm with first degree AV block  Echo 08/26/11- EF 50-55%, mild biatrial enlargement (LA size 41mm), no significant valvular disease  Assessment and Plan:

## 2011-10-09 NOTE — Telephone Encounter (Signed)
Please return call to patient, she would like to changed procedure dates. [phone]

## 2011-10-12 DIAGNOSIS — I10 Essential (primary) hypertension: Secondary | ICD-10-CM | POA: Insufficient documentation

## 2011-10-12 NOTE — Assessment & Plan Note (Signed)
Stable No change required today  

## 2011-10-12 NOTE — Assessment & Plan Note (Signed)
The patient has symptomatic persistent atrial fibrillation.  She has failed medical therapy with flecainide and metoprolol.  Her LA size is 41mm and she has no significant structural heart disease. Therapeutic strategies for afib including medicine and ablation were discussed in detail with the patient today. Risk, benefits, and alternatives to EP study and radiofrequency ablation for afib were also discussed in detail today. These risks include but are not limited to stroke, bleeding, vascular damage, tamponade, perforation, damage to the esophagus, lungs, and other structures, pulmonary vein stenosis, worsening renal function, and death. The patient understands these risk and wishes to proceed.  She is appropriately anticoagulated with xarelto. I have informed her that we have no data to suggest that catheter ablation is an alternative to anticoagulation.  She will therefore require long term anticoagulation.  She is concerned about risks of falls with parkinsons though admits that she has not fallen.  We discussed left atrial occlusion as an alternative to anticoagulation as a potential options down the road though she is aware that this has not yet been approved by the FDA.

## 2011-10-21 ENCOUNTER — Encounter: Payer: Self-pay | Admitting: *Deleted

## 2011-10-21 ENCOUNTER — Other Ambulatory Visit: Payer: Self-pay | Admitting: *Deleted

## 2011-10-21 DIAGNOSIS — Z23 Encounter for immunization: Secondary | ICD-10-CM | POA: Diagnosis not present

## 2011-10-21 DIAGNOSIS — Z01812 Encounter for preprocedural laboratory examination: Secondary | ICD-10-CM

## 2011-10-21 DIAGNOSIS — I4891 Unspecified atrial fibrillation: Secondary | ICD-10-CM

## 2011-10-23 ENCOUNTER — Ambulatory Visit (INDEPENDENT_AMBULATORY_CARE_PROVIDER_SITE_OTHER): Payer: Medicare Other | Admitting: Cardiovascular Disease

## 2011-10-23 ENCOUNTER — Encounter: Payer: Self-pay | Admitting: Cardiovascular Disease

## 2011-10-23 VITALS — BP 114/86 | HR 72 | Ht 62.0 in | Wt 150.0 lb

## 2011-10-23 DIAGNOSIS — I4891 Unspecified atrial fibrillation: Secondary | ICD-10-CM | POA: Diagnosis not present

## 2011-10-23 MED ORDER — METOPROLOL SUCCINATE ER 50 MG PO TB24: 50.0000 mg | ORAL_TABLET | Freq: Every day | ORAL | Status: DC

## 2011-10-23 NOTE — Assessment & Plan Note (Signed)
[name] [name] seems to be doing fairly well. She remains in atrial fibrillation. Flecainide was not effective. She is scheduled to see Dr. Johney Frame in several weeks for atrial fibrillation ablation.  Her heart rate is a little fast and she has some vague tightness with exertion. She has had a normal Myoview study. I suspect that her heart rate be increasing. We'll increase her metoprolol XL 50 mg a day. The Toprol had originally been decreased from 50 to 25 we added flecainide. Now she is off Flecainide, I think it we can go back up to Toprol-XL 50 mg a day.

## 2011-10-23 NOTE — Progress Notes (Signed)
[name] [name]  Date of Birth  13-Oct-1940    [facility] [address]    [address] [place], [place]  [address] [phone]  Fax  [phone]  Problems: 1. Atrial Fibrillation  2. Hypertension 3. Hyperlipidemia 4. Parkinson's    History of Present Illness:  71 year old female with a history of atrial fibrillation. Her heart rate has been fairly well controlled on flecainide 100 mg twice a day . She has episodes of severe fatigue after she takes her medications.    Pt has had some chest pain with walking over the past several months. She had a normal stress Myoview study in May, 2010. She has had a few episodes of atrial ablation a typically occur when she's dehydrated. She is modestly better hydrated and this seems to make his episodes of atrial fibrillation.    She typically walks 50 minutes a day-4 times a week.  She's had 2 episodes of chest pressure while walking. The episode started with exercise and was relieved when she stops to rest.  She stopped her statin because of leg weakness.  She feels a bit better off the statin.    Current Outpatient Prescriptions on File Prior to Visit  Medication Sig Dispense Refill  . B Complex Vitamins (B COMPLEX PO) Take by mouth.        [name] CALCIUM PO Take by mouth.        . Calcium Polycarbophil (FIBERCON PO) Take by mouth daily.        . Coenzyme Q10 (CO Q 10 PO) Take by mouth.        Tery Sanfilippo Sodium (COLACE PO) Take by mouth daily.        [name] ezetimibe (ZETIA) 10 MG tablet Take 10 mg by mouth every other day. Taking the same day as the crestor      . losartan (COZAAR) 25 MG tablet Take 25 mg by mouth daily.        [name] MAGNESIUM PO Take by mouth daily.        . metoprolol succinate (TOPROL-XL) 50 MG 24 hr tablet Take 25 mg by mouth daily. Take with or immediately following a meal.      . Multiple Vitamin (MULTIVITAMIN PO) Take by mouth.        . Omega-3 Fatty Acids (FISH OIL PO) Take by mouth.        . Omeprazole (PRILOSEC  PO) Take by mouth daily.        . rasagiline (AZILECT) 0.5 MG TABS Take 0.5 mg by mouth daily.        . Rivaroxaban (XARELTO) 20 MG TABS Take 1 tablet (20 mg total) by mouth daily.  30 tablet  3  . rosuvastatin (CRESTOR) 5 MG tablet Take 2.5 mg by mouth every other day.      . trihexyphenidyl (ARTANE) 2 MG tablet Patient has not started yet      . Vitamin D, Ergocalciferol, (DRISDOL) 50000 UNITS CAPS         No Known Allergies  Past Medical History  Diagnosis Date  . PAF (paroxysmal atrial fibrillation)     has had prior cardioversion in 2010; on Flecainide  . Hypertension   . Hyperlipidemia   . Leg fracture Dec 21,2011    recent   . Hip fracture 11-08-2008    Recent  . Chronic anticoagulation     on Xarelto  . High risk medication use     on Flecainide  . GERD (gastroesophageal  reflux disease)   . Parkinson's disease   . Arthritis     lt thumb    Past Surgical History  Procedure Date  . Knee surgery     2  . US echocardiography 03-10-2008    Est EF 55-60%  . Cardiovascular stress test 06-06-2008    EF 73%  . Cholecystectomy   . Tonsillectomy   . Wrist surgery   . Cardioversion 09/09/2011    Procedure: CARDIOVERSION;  Surgeon: Cassell Clement, MD;  Location: [facility] ENDOSCOPY;  Service: Cardiovascular;  Laterality: N/A;  to be done by dr. Patty Sermons    History  Smoking status  . Never Smoker   Smokeless tobacco  . Not on file    History  Alcohol Use  . 4.8 oz/week  . 8 Glasses of wine per week    wine x2 per day    Family History  Problem Relation Age of Onset  . Alzheimer's disease Mother     Reviw of Systems:  Reviewed in the HPI.  All other systems are negative.  Physical Exam: BP 114/86  Pulse 72  Ht 5\' 2"  (1.575 m)  Wt 150 lb (68.04 kg)  BMI 27.44 kg/m2 The patient is alert and oriented x 3.  The mood and affect are normal.   Skin: warm and dry.  Color is normal.    HEENT:   the sclera are nonicteric.  The mucous membranes are moist.  The carotids  are 2+ without bruits.  There is no thyromegaly.  There is no JVD.    Lungs: clear.  The chest wall is non tender.    Heart: Irregularly irregular with a normal S1 and S2.  There are no murmurs, gallops, or rubs. The PMI is not displaced.     Abdomen: good bowel sounds.  There is no guarding or rebound.  There is no hepatosplenomegaly or tenderness.  There are no masses.   Extremities:  no clubbing, cyanosis, or edema.  The legs are without rashes.  The distal pulses are intact.   Neuro:  Cranial nerves II - XII are intact.  Motor and sensory functions are intact.    The gait is normal.  ECG:  Assessment / Plan:

## 2011-10-23 NOTE — Patient Instructions (Addendum)
Your physician recommends that you schedule a follow-up appointment in: 3 months   Your physician has recommended you make the following change in your medication:   INCREASE METOPROLOL/ TOPROL XL TO 50 MG DAILY.

## 2011-10-28 ENCOUNTER — Telehealth: Payer: Self-pay | Admitting: Internal Medicine

## 2011-10-28 ENCOUNTER — Encounter (HOSPITAL_COMMUNITY): Payer: Self-pay | Admitting: Pharmacy Technician

## 2011-10-28 NOTE — Telephone Encounter (Signed)
Ablation is set for 10/08  Will change TEE to 11/10/11 with Dr Synetta Fail will call patient tomorrow

## 2011-10-28 NOTE — Telephone Encounter (Signed)
New problem:  ablation should be on 10/8 @ 7:30 . Instead of 10/15.

## 2011-10-29 ENCOUNTER — Telehealth: Payer: Self-pay | Admitting: Cardiovascular Disease

## 2011-10-29 ENCOUNTER — Encounter: Payer: Self-pay | Admitting: Cardiovascular Disease

## 2011-10-29 ENCOUNTER — Encounter: Payer: Self-pay | Admitting: *Deleted

## 2011-10-29 MED ORDER — METOPROLOL SUCCINATE ER 50 MG PO TB24: 50.0000 mg | ORAL_TABLET | Freq: Every day | ORAL | Status: DC

## 2011-10-29 NOTE — Telephone Encounter (Signed)
Pt was on flecainide and metoprolol tartrate once a day prior to stopping both meds and she was  placed on metoprolol succinate, pthas not picked up new med yet but will today and was told it was long acting and needs to take once daily. Pt verbalized understanding.

## 2011-10-29 NOTE — Telephone Encounter (Signed)
Pt calling re having a procedure and got a call form [facility] saying she was on the wrong toprol, pls advise

## 2011-11-03 ENCOUNTER — Other Ambulatory Visit (INDEPENDENT_AMBULATORY_CARE_PROVIDER_SITE_OTHER): Payer: Medicare Other

## 2011-11-03 DIAGNOSIS — I4891 Unspecified atrial fibrillation: Secondary | ICD-10-CM | POA: Diagnosis not present

## 2011-11-03 DIAGNOSIS — Z01812 Encounter for preprocedural laboratory examination: Secondary | ICD-10-CM | POA: Diagnosis not present

## 2011-11-03 LAB — CBC WITH DIFFERENTIAL/PLATELET
Basophils Relative: 0.4 % (ref 0.0–3.0)
Eosinophils Relative: 1.4 % (ref 0.0–5.0)
MCV: 92.8 fl (ref 78.0–100.0)
Monocytes Absolute: 0.4 10*3/uL (ref 0.1–1.0)
Neutrophils Relative %: 64.9 % (ref 43.0–77.0)
RBC: 3.98 Mil/uL (ref 3.87–5.11)
WBC: 5 10*3/uL (ref 4.5–10.5)

## 2011-11-03 LAB — BASIC METABOLIC PANEL
Chloride: 103 mEq/L (ref 96–112)
Creatinine, Ser: 0.7 mg/dL (ref 0.4–1.2)
Potassium: 4 mEq/L (ref 3.5–5.1)

## 2011-11-03 LAB — PROTIME-INR: INR: 1.7 ratio — ABNORMAL HIGH (ref 0.8–1.0)

## 2011-11-04 ENCOUNTER — Encounter: Payer: Self-pay | Admitting: *Deleted

## 2011-11-10 ENCOUNTER — Ambulatory Visit (HOSPITAL_COMMUNITY)
Admission: RE | Admit: 2011-11-10 | Discharge: 2011-11-10 | Disposition: A | Discharge status: Home / Self Care | Payer: Medicare Other | Source: Ambulatory Visit | Attending: Cardiovascular Disease | Admitting: Cardiovascular Disease

## 2011-11-10 ENCOUNTER — Encounter (HOSPITAL_COMMUNITY): Payer: Self-pay | Admitting: *Deleted

## 2011-11-10 ENCOUNTER — Encounter (HOSPITAL_COMMUNITY)
Admission: RE | Disposition: A | Discharge status: Home / Self Care | Payer: Self-pay | Source: Ambulatory Visit | Attending: Cardiovascular Disease

## 2011-11-10 DIAGNOSIS — I4891 Unspecified atrial fibrillation: Secondary | ICD-10-CM | POA: Insufficient documentation

## 2011-11-10 HISTORY — PX: TEE WITHOUT CARDIOVERSION: SHX5443

## 2011-11-10 SURGERY — ECHOCARDIOGRAM, TRANSESOPHAGEAL
Anesthesia: Moderate Sedation

## 2011-11-10 MED ORDER — BUTAMBEN-TETRACAINE-BENZOCAINE 2-2-14 % EX AERO
INHALATION_SPRAY | CUTANEOUS | Status: DC | PRN
  Administered 2011-11-10: 2 via TOPICAL

## 2011-11-10 MED ORDER — FENTANYL CITRATE 0.05 MG/ML IJ SOLN
INTRAMUSCULAR | Status: AC
  Filled 2011-11-10: qty 2

## 2011-11-10 MED ORDER — SODIUM CHLORIDE 0.9 % IV SOLN
INTRAVENOUS | Status: DC
  Administered 2011-11-10: 500 mL via INTRAVENOUS

## 2011-11-10 MED ORDER — MIDAZOLAM HCL 10 MG/2ML IJ SOLN
INTRAMUSCULAR | Status: DC | PRN
  Administered 2011-11-10 (×2): 2.5 mg via INTRAVENOUS
  Administered 2011-11-10: 1 mg via INTRAVENOUS

## 2011-11-10 MED ORDER — SODIUM CHLORIDE 0.9 % IV SOLN: INTRAVENOUS | Status: DC

## 2011-11-10 MED ORDER — FENTANYL CITRATE 0.05 MG/ML IJ SOLN
INTRAMUSCULAR | Status: DC | PRN
  Administered 2011-11-10 (×4): 25 ug via INTRAVENOUS

## 2011-11-10 MED ORDER — MIDAZOLAM HCL 5 MG/ML IJ SOLN
INTRAMUSCULAR | Status: AC
  Filled 2011-11-10: qty 2

## 2011-11-10 NOTE — Interval H&P Note (Signed)
History and Physical Interval Note:  11/10/2011 10:47 AM  [name] [name]  has presented today for surgery, with the diagnosis of atri-fib  The various methods of treatment have been discussed with the patient and family. After consideration of risks, benefits and other options for treatment, the patient has consented to  Procedure(s) (LRB) with comments: TRANSESOPHAGEAL ECHOCARDIOGRAM (TEE) (N/A) as a surgical intervention .  The patient's history has been reviewed, patient examined, no change in status, stable for surgery.  I have reviewed the patient's chart and labs.  Questions were answered to the patient's satisfaction.     [name].

## 2011-11-10 NOTE — H&P (Signed)
[name] [name]  Date of Birth 1940-07-22  [facility]  [address]  [place], [place] [address]  [phone] Fax [phone]  Problems:  1. Atrial Fibrillation  2. Hypertension  3. Hyperlipidemia  4. Parkinson's  History of Present Illness:  71 year old female with a history of atrial fibrillation. Her heart rate has been fairly well controlled on flecainide 100 mg twice a day . She has episodes of severe fatigue after she takes her medications.  Pt has had some chest pain with walking over the past several months. She had a normal stress Myoview study in May, 2010.  She has had a few episodes of atrial ablation a typically occur when she's dehydrated. She is modestly better hydrated and this seems to make his episodes of atrial fibrillation.  She typically walks 50 minutes a day-4 times a week. She's had 2 episodes of chest pressure while walking. The episode started with exercise and was relieved when she stops to rest.  She stopped her statin because of leg weakness. She feels a bit better off the statin.  Current Outpatient Prescriptions on File Prior to Visit   Medication  Sig  Dispense  Refill   .  B Complex Vitamins (B COMPLEX PO)  Take by mouth.     [name]  CALCIUM PO  Take by mouth.     .  Calcium Polycarbophil (FIBERCON PO)  Take by mouth daily.     .  Coenzyme Q10 (CO Q 10 PO)  Take by mouth.     Tery Sanfilippo Sodium (COLACE PO)  Take by mouth daily.     [name]  ezetimibe (ZETIA) 10 MG tablet  Take 10 mg by mouth every other day. Taking the same day as the crestor     .  losartan (COZAAR) 25 MG tablet  Take 25 mg by mouth daily.     [name]  MAGNESIUM PO  Take by mouth daily.     .  metoprolol succinate (TOPROL-XL) 50 MG 24 hr tablet  Take 25 mg by mouth daily. Take with or immediately following a meal.     .  Multiple Vitamin (MULTIVITAMIN PO)  Take by mouth.     .  Omega-3 Fatty Acids (FISH OIL PO)  Take by mouth.     .  Omeprazole (PRILOSEC PO)  Take by mouth daily.       .  rasagiline (AZILECT) 0.5 MG TABS  Take 0.5 mg by mouth daily.     .  Rivaroxaban (XARELTO) 20 MG TABS  Take 1 tablet (20 mg total) by mouth daily.  30 tablet  3   .  rosuvastatin (CRESTOR) 5 MG tablet  Take 2.5 mg by mouth every other day.     .  trihexyphenidyl (ARTANE) 2 MG tablet  Patient has not started yet     .  Vitamin D, Ergocalciferol, (DRISDOL) 50000 UNITS CAPS       No Known Allergies  Past Medical History   Diagnosis  Date   .  PAF (paroxysmal atrial fibrillation)      has had prior cardioversion in 2010; on Flecainide   .  Hypertension    .  Hyperlipidemia    .  Leg fracture  Dec 21,2011     recent   .  Hip fracture  11-08-2008     Recent   .  Chronic anticoagulation      on Xarelto   .  High risk medication use  on Flecainide   .  GERD (gastroesophageal reflux disease)    .  Parkinson's disease    .  Arthritis      lt thumb    Past Surgical History   Procedure  Date   .  Knee surgery      2   .  US echocardiography  03-10-2008     Est EF 55-60%   .  Cardiovascular stress test  06-06-2008     EF 73%   .  Cholecystectomy    .  Tonsillectomy    .  Wrist surgery    .  Cardioversion  09/09/2011     Procedure: CARDIOVERSION; Surgeon: Cassell Clement, MD; Location: [facility] ENDOSCOPY; Service: Cardiovascular; Laterality: N/A; to be done by dr. Patty Sermons    History   Smoking status   .  Never Smoker   Smokeless tobacco   .  Not on file    History   Alcohol Use   .  4.8 oz/week   .  8 Glasses of wine per week     wine x2 per day    Family History   Problem  Relation  Age of Onset   .  Alzheimer's disease  Mother     Reviw of Systems:  Reviewed in the HPI. All other systems are negative.  Physical Exam:  BP 114/86  Pulse 72  Ht 5\' 2"  (1.575 m)  Wt 150 lb (68.04 kg)  BMI 27.44 kg/m2  The patient is alert and oriented x 3. The mood and affect are normal.  Skin: warm and dry. Color is normal.  HEENT: the sclera are nonicteric. The mucous membranes are  moist. The carotids are 2+ without bruits. There is no thyromegaly. There is no JVD.  Lungs: clear. The chest wall is non tender.  Heart: Irregularly irregular with a normal S1 and S2. There are no murmurs, gallops, or rubs. The PMI is not displaced.  Abdomen: good bowel sounds. There is no guarding or rebound. There is no hepatosplenomegaly or tenderness. There are no masses.  Extremities: no clubbing, cyanosis, or edema. The legs are without rashes. The distal pulses are intact.  Neuro: Cranial nerves II - XII are intact. Motor and sensory functions are intact.  The gait is normal.  ECG:  Assessment / Plan:   A-fib - [name] [name]., MD 10/23/2011 9:03 AM Signed  [name] [name] seems to be doing fairly well. She remains in atrial fibrillation. Flecainide was not effective. She is scheduled to see Dr. Johney [name] in several weeks for atrial fibrillation ablation.  Her heart rate is a little fast and she has some vague tightness with exertion. She has had a normal Myoview study. I suspect that her heart rate be increasing. We'll increase her metoprolol XL 50 mg a day. The Toprol had originally been decreased from 50 to 25 we added flecainide. Now she is off Flecainide, I think it we can go back up to Toprol-XL 50 mg a day.  [name] [name], [name] [name]., MD, [facility] 11/10/2011, 10:16 AM Office - [phone] Pager [phone]

## 2011-11-10 NOTE — CV Procedure (Signed)
    Transesophageal Echocardiogram Note  Nautika Cressey [id number] 09/26/1940  Procedure: Transesophageal Echocardiogram Indications: Atrial Fibrillation , pre - A-Fib ablation  Procedure Details Consent: Obtained Time Out: Verified patient identification, verified procedure, site/side was marked, verified correct patient position, special equipment/implants available, Radiology Safety Procedures followed,  medications/allergies/relevent history reviewed, required imaging and test results available.  Performed  Medications: Fentanyl: 100 mcg IV Versed: 6 mg IV  Left Ventrical:  Normal LV function  Mitral Valve: trace MR  Aortic Valve: normal AoV  Tricuspid Valve: mild TR  Pulmonic Valve: normal PV  Left Atrium/ Left atrial appendage: no thrombi  Atrial septum: normal  Aorta: normal   Complications: No apparent complications Patient did tolerate procedure well.  Pt is ok for RF ablation tomorrow.   Vesta Mixer, Montez Hageman., MD, [facility] 11/10/2011, 11:09 AM

## 2011-11-11 ENCOUNTER — Other Ambulatory Visit: Payer: Medicare Other

## 2011-11-11 ENCOUNTER — Encounter (HOSPITAL_COMMUNITY): Payer: Self-pay | Admitting: Certified Registered"

## 2011-11-11 ENCOUNTER — Ambulatory Visit (HOSPITAL_COMMUNITY)
Admission: RE | Admit: 2011-11-11 | Discharge: 2011-11-12 | Disposition: A | Discharge status: Home / Self Care | Payer: Medicare Other | Source: Ambulatory Visit | Attending: Internal Medicine | Admitting: Internal Medicine

## 2011-11-11 ENCOUNTER — Ambulatory Visit (HOSPITAL_COMMUNITY): Payer: Medicare Other | Admitting: Certified Registered"

## 2011-11-11 ENCOUNTER — Encounter (HOSPITAL_COMMUNITY)
Admission: RE | Disposition: A | Discharge status: Home / Self Care | Payer: Self-pay | Source: Ambulatory Visit | Attending: Internal Medicine

## 2011-11-11 ENCOUNTER — Encounter (HOSPITAL_COMMUNITY): Payer: Self-pay | Admitting: Emergency Medicine

## 2011-11-11 DIAGNOSIS — M129 Arthropathy, unspecified: Secondary | ICD-10-CM | POA: Diagnosis not present

## 2011-11-11 DIAGNOSIS — E785 Hyperlipidemia, unspecified: Secondary | ICD-10-CM | POA: Diagnosis not present

## 2011-11-11 DIAGNOSIS — I10 Essential (primary) hypertension: Secondary | ICD-10-CM | POA: Diagnosis not present

## 2011-11-11 DIAGNOSIS — I4891 Unspecified atrial fibrillation: Secondary | ICD-10-CM

## 2011-11-11 DIAGNOSIS — K219 Gastro-esophageal reflux disease without esophagitis: Secondary | ICD-10-CM | POA: Diagnosis not present

## 2011-11-11 DIAGNOSIS — Z7901 Long term (current) use of anticoagulants: Secondary | ICD-10-CM | POA: Insufficient documentation

## 2011-11-11 DIAGNOSIS — G20A1 Parkinson's disease without dyskinesia, without mention of fluctuations: Secondary | ICD-10-CM | POA: Insufficient documentation

## 2011-11-11 DIAGNOSIS — G20 Parkinson's disease: Secondary | ICD-10-CM | POA: Insufficient documentation

## 2011-11-11 HISTORY — PX: ATRIAL FIBRILLATION ABLATION: SHX5456

## 2011-11-11 LAB — MRSA PCR SCREENING: MRSA by PCR: NEGATIVE

## 2011-11-11 LAB — POCT ACTIVATED CLOTTING TIME
Activated Clotting Time: 304 seconds
Activated Clotting Time: 160 seconds

## 2011-11-11 SURGERY — ATRIAL FIBRILLATION ABLATION
Anesthesia: General

## 2011-11-11 MED ORDER — MIDAZOLAM HCL 5 MG/5ML IJ SOLN
INTRAMUSCULAR | Status: DC | PRN
  Administered 2011-11-11 (×2): 1 mg via INTRAVENOUS

## 2011-11-11 MED ORDER — ACETAMINOPHEN 325 MG PO TABS: 650.0000 mg | ORAL_TABLET | ORAL | Status: DC | PRN

## 2011-11-11 MED ORDER — PROPOFOL 10 MG/ML IV BOLUS
INTRAVENOUS | Status: DC | PRN
  Administered 2011-11-11: 40 mg via INTRAVENOUS

## 2011-11-11 MED ORDER — MEPERIDINE HCL 25 MG/ML IJ SOLN: 6.2500 mg | INTRAMUSCULAR | Status: DC | PRN

## 2011-11-11 MED ORDER — OXYCODONE HCL 5 MG/5ML PO SOLN
5.0000 mg | Freq: Once | ORAL | Status: AC | PRN
Start: 2011-11-11 — End: 2011-11-11

## 2011-11-11 MED ORDER — METOPROLOL SUCCINATE ER 50 MG PO TB24
50.0000 mg | ORAL_TABLET | Freq: Every day | ORAL | Status: DC
  Filled 2011-11-11: qty 1

## 2011-11-11 MED ORDER — SODIUM CHLORIDE 0.9 % IV SOLN: 250.0000 mL | INTRAVENOUS | Status: DC | PRN

## 2011-11-11 MED ORDER — ONDANSETRON HCL 4 MG/2ML IJ SOLN: 4.0000 mg | Freq: Four times a day (QID) | INTRAMUSCULAR | Status: DC | PRN

## 2011-11-11 MED ORDER — ONDANSETRON HCL 4 MG/2ML IJ SOLN
INTRAMUSCULAR | Status: DC | PRN
  Administered 2011-11-11: 4 mg via INTRAVENOUS

## 2011-11-11 MED ORDER — SODIUM CHLORIDE 0.9 % IJ SOLN
3.0000 mL | Freq: Two times a day (BID) | INTRAMUSCULAR | Status: DC
  Administered 2011-11-11: 3 mL via INTRAVENOUS

## 2011-11-11 MED ORDER — HYDROCORTISONE 1 % EX CREA
TOPICAL_CREAM | Freq: Three times a day (TID) | CUTANEOUS | Status: DC
  Administered 2011-11-11 (×2): via TOPICAL
  Filled 2011-11-11: qty 28

## 2011-11-11 MED ORDER — PROMETHAZINE HCL 25 MG/ML IJ SOLN: 6.2500 mg | INTRAMUSCULAR | Status: DC | PRN

## 2011-11-11 MED ORDER — RASAGILINE MESYLATE 1 MG PO TABS
1.0000 mg | ORAL_TABLET | Freq: Every day | ORAL | Status: DC
  Administered 2011-11-11: 1 mg via ORAL
  Filled 2011-11-11 (×2): qty 1

## 2011-11-11 MED ORDER — LOSARTAN POTASSIUM 25 MG PO TABS
25.0000 mg | ORAL_TABLET | Freq: Every day | ORAL | Status: DC
  Administered 2011-11-11: 25 mg via ORAL
  Filled 2011-11-11 (×2): qty 1

## 2011-11-11 MED ORDER — RIVAROXABAN 20 MG PO TABS
20.0000 mg | ORAL_TABLET | Freq: Every day | ORAL | Status: DC
  Administered 2011-11-11: 20 mg via ORAL
  Filled 2011-11-11 (×2): qty 1

## 2011-11-11 MED ORDER — LACTATED RINGERS IV SOLN
INTRAVENOUS | Status: DC | PRN
  Administered 2011-11-11: 08:00:00 via INTRAVENOUS

## 2011-11-11 MED ORDER — BUPIVACAINE HCL (PF) 0.25 % IJ SOLN
INTRAMUSCULAR | Status: AC
  Filled 2011-11-11: qty 30

## 2011-11-11 MED ORDER — HYDROMORPHONE HCL PF 1 MG/ML IJ SOLN: 0.2500 mg | INTRAMUSCULAR | Status: DC | PRN

## 2011-11-11 MED ORDER — FENTANYL CITRATE 0.05 MG/ML IJ SOLN
INTRAMUSCULAR | Status: DC | PRN
  Administered 2011-11-11: 50 ug via INTRAVENOUS
  Administered 2011-11-11 (×3): 25 ug via INTRAVENOUS
  Administered 2011-11-11 (×2): 50 ug via INTRAVENOUS
  Administered 2011-11-11: 25 ug via INTRAVENOUS

## 2011-11-11 MED ORDER — PROTAMINE SULFATE 10 MG/ML IV SOLN
INTRAVENOUS | Status: DC | PRN
  Administered 2011-11-11: 30 mg via INTRAVENOUS

## 2011-11-11 MED ORDER — HYDROCODONE-ACETAMINOPHEN 5-325 MG PO TABS: 1.0000 | ORAL_TABLET | ORAL | Status: DC | PRN

## 2011-11-11 MED ORDER — HEPARIN SODIUM (PORCINE) 1000 UNIT/ML IJ SOLN
INTRAMUSCULAR | Status: AC
  Filled 2011-11-11: qty 1

## 2011-11-11 MED ORDER — SODIUM CHLORIDE 0.9 % IJ SOLN: 3.0000 mL | INTRAMUSCULAR | Status: DC | PRN

## 2011-11-11 MED ORDER — HEPARIN SODIUM (PORCINE) 1000 UNIT/ML IJ SOLN
INTRAMUSCULAR | Status: DC | PRN
  Administered 2011-11-11 (×2): 1000 [IU] via INTRAVENOUS

## 2011-11-11 MED ORDER — FLECAINIDE ACETATE 100 MG PO TABS
100.0000 mg | ORAL_TABLET | Freq: Two times a day (BID) | ORAL | Status: DC
  Administered 2011-11-11 (×2): 100 mg via ORAL
  Filled 2011-11-11 (×4): qty 1

## 2011-11-11 MED ORDER — OXYCODONE HCL 5 MG PO TABS: 5.0000 mg | ORAL_TABLET | Freq: Once | ORAL | Status: AC | PRN

## 2011-11-11 MED ORDER — SODIUM CHLORIDE 0.9 % IV SOLN
200.0000 ug | INTRAVENOUS | Status: DC | PRN
  Administered 2011-11-11: 0.3 ug/kg/h via INTRAVENOUS

## 2011-11-11 NOTE — H&P (Signed)
Primary Care Physician: Ezequiel Kayser, MD  Referring Physician: Dr Benjaman Lobe Kliethermes is a 71 y.o. female with a h/o atrial fibrillation who presents today for EP consultation. She reports initially being diagnosed with atrial fibrillation January 2006 after presenting to Dr Waynard Edwards with tachypalpitations following a viral GI illness. She was initiated on anticoagulation and cardioverted in 2006. She returned to afib several months later and was again cardioverted. She was treated with metoprolol and maintained sinus rhythm for over a year. She was evaluated and [facility] and initiated on flecainide. She did "great" on flecainide for several years. In 2010 she developed recurrent afib and required another cardioverted. She did well until June 2013 when she developed recurrent afib while at the beach. She was initiated on xarelto and underwent unsuccessful cardioversion 09/12/11. She remains in afib since that time. She reports symptoms of palpitations, fatigue and decreased exercise tolerance.  Today, she denies symptoms of chest pain, orthopnea, PND, lower extremity edema, dizziness, presyncope, or syncope. The patient is tolerating medications without difficulties and is otherwise without complaint today.   Past Medical History   Diagnosis  Date   .  PAF (paroxysmal atrial fibrillation)      has had prior cardioversion in 2010; on Flecainide   .  Hypertension    .  Hyperlipidemia    .  Leg fracture  Dec 21,2011     recent   .  Hip fracture  11-08-2008     Recent   .  Chronic anticoagulation      on Xarelto   .  High risk medication use      on Flecainide   .  GERD (gastroesophageal reflux disease)    .  Parkinson's disease    .  Arthritis      lt thumb    Past Surgical History   Procedure  Date   .  Knee surgery      2   .  US echocardiography  03-10-2008     Est EF 55-60%   .  Cardiovascular stress test  06-06-2008     EF 73%   .  Cholecystectomy    .  Tonsillectomy     .  Wrist surgery    .  Cardioversion  09/09/2011     Procedure: CARDIOVERSION; Surgeon: Cassell Clement, MD; Location: [facility] ENDOSCOPY; Service: Cardiovascular; Laterality: N/A; to be done by dr. Patty Sermons    Current Outpatient Prescriptions   Medication  Sig  Dispense  Refill   .  B Complex Vitamins (B COMPLEX PO)  Take by mouth.     [name]  CALCIUM PO  Take by mouth.     .  Calcium Polycarbophil (FIBERCON PO)  Take by mouth daily.     .  Coenzyme Q10 (CO Q 10 PO)  Take by mouth.     Tery Sanfilippo Sodium (COLACE PO)  Take by mouth daily.     [name]  ezetimibe (ZETIA) 10 MG tablet  Take 10 mg by mouth every other day. Taking the same day as the crestor     .  flecainide (TAMBOCOR) 100 MG tablet  Take 1 tablet (100 mg total) by mouth 2 (two) times daily.  60 tablet  12   .  losartan (COZAAR) 25 MG tablet  Take 25 mg by mouth daily.     [name]  MAGNESIUM PO  Take by mouth daily.     .  metoprolol succinate (TOPROL-XL) 50 MG 24  hr tablet  Take 25 mg by mouth daily. Take with or immediately following a meal.     .  Multiple Vitamin (MULTIVITAMIN PO)  Take by mouth.     .  Omega-3 Fatty Acids (FISH OIL PO)  Take by mouth.     .  Omeprazole (PRILOSEC PO)  Take by mouth daily.     .  rasagiline (AZILECT) 0.5 MG TABS  Take 0.5 mg by mouth daily.     .  Rivaroxaban (XARELTO) 20 MG TABS  Take 1 tablet (20 mg total) by mouth daily.  30 tablet  3   .  rosuvastatin (CRESTOR) 5 MG tablet  Take 2.5 mg by mouth every other day.     .  Vitamin D, Ergocalciferol, (DRISDOL) 50000 UNITS CAPS      .  trihexyphenidyl (ARTANE) 2 MG tablet  Patient has not started yet      No Known Allergies  History    Social History   .  Marital Status:  Married     Spouse Name:  N/A     Number of Children:  N/A   .  Years of Education:  N/A    Occupational History   .  Not on file.    Social History Main Topics   .  Smoking status:  Never Smoker   .  Smokeless tobacco:  Not on file   .  Alcohol Use:  4.8 oz/week     8 Glasses of  wine per week      wine x2 per day   .  Drug Use:  No   .  Sexually Active:  Not Currently    Other Topics  Concern   .  Not on file    Social History Narrative    Lives in [place]. Retired Comptroller.    Family History   Problem  Relation  Age of Onset   .  Alzheimer's disease  Mother     ROS- All systems are reviewed and negative except as per the HPI above  Physical Exam:  Filed Vitals:    10/09/11 1505   BP:  98/78   Pulse:  70   Height:  5\' 2"  (1.575 m)   Weight:  149 lb (67.586 kg)    GEN- The patient is well appearing, alert and oriented x 3 today.  Head- normocephalic, atraumatic  Eyes- Sclera clear, conjunctiva pink  Ears- hearing intact  Oropharynx- clear  Neck- supple, no JVP  Lymph- no cervical lymphadenopathy  Lungs- Clear to ausculation bilaterally, normal work of breathing  Heart- irregular rate and rhythm, no murmurs, rubs or gallops, PMI not laterally displaced  GI- soft, NT, ND, + BS  Extremities- no clubbing, cyanosis, or edema  MS- no significant deformity or atrophy  Skin- no rash or lesion  Psych- euthymic mood, full affect  Neuro- resting parkinsons tremor  EKG 09/10/11 reveals afib, V rate 63 bpm, QTc 464  EKG 08/30/10 reveals sinus bradycardia 42 bpm with first degree AV block  Echo 08/26/11- EF 50-55%, mild biatrial enlargement (LA size 41mm), no significant valvular disease  Assessment and Plan:   A-fib  The patient has symptomatic persistent atrial fibrillation. She has failed medical therapy with flecainide and metoprolol. Her LA size is 41mm and she has no significant structural heart disease.  Risk, benefits, and alternatives to EP study and radiofrequency ablation for afib were also discussed in detail today. These risks include but are not limited to stroke, bleeding,  vascular damage, tamponade, perforation, damage to the esophagus, lungs, and other structures, pulmonary vein stenosis, worsening renal function, and death. The patient  understands these risk and wishes to proceed. She is appropriately anticoagulated with xarelto.

## 2011-11-11 NOTE — Anesthesia Preprocedure Evaluation (Addendum)
Anesthesia Evaluation  Patient identified by MRN, date of birth, ID band Patient awake    Reviewed: Allergy & Precautions, H&P , NPO status , Patient's Chart, lab work & pertinent test results  Airway Mallampati: I  Neck ROM: Full    Dental  (+) Teeth Intact   Pulmonary  breath sounds clear to auscultation        Cardiovascular hypertension, + dysrhythmias Atrial Fibrillation Rhythm:Irregular Rate:Normal     Neuro/Psych Parkinson disease    GI/Hepatic Neg liver ROS, GERD-  ,  Endo/Other  negative endocrine ROS  Renal/GU negative Renal ROS     Musculoskeletal   Abdominal   Peds  Hematology negative hematology ROS (+)   Anesthesia Other Findings   Reproductive/Obstetrics                           Anesthesia Physical Anesthesia Plan  ASA: III  Anesthesia Plan: General   Post-op Pain Management:    Induction: Intravenous  Airway Management Planned: LMA  Additional Equipment:   Intra-op Plan:   Post-operative Plan: Extubation in OR  Informed Consent:   Dental advisory given  Plan Discussed with: Surgeon, CRNA and Anesthesiologist  Anesthesia Plan Comments:        Anesthesia Quick Evaluation

## 2011-11-11 NOTE — Transfer of Care (Signed)
Immediate Anesthesia Transfer of Care Note  Patient: [name] [name]  Procedure(s) Performed: Procedure(s) (LRB) with comments: ATRIAL FIBRILLATION ABLATION (N/A)  Patient Location: PACU and Cath Lab  Anesthesia Type: MAC  Level of Consciousness: awake, alert , oriented and patient cooperative  Airway & Oxygen Therapy: Patient Spontanous Breathing  Post-op Assessment: Report given to PACU RN, Post -op Vital signs reviewed and stable and Patient moving all extremities  Post vital signs: Reviewed and stable  Complications: No apparent anesthesia complications

## 2011-11-11 NOTE — Op Note (Signed)
SURGEON:  Hillis Range, MD  PREPROCEDURE DIAGNOSES: 1. Persistent atrial fibrillation.  POSTPROCEDURE DIAGNOSES: 1. Persistent atrial fibrillation.  PROCEDURES: 1. Comprehensive electrophysiologic study. 2. Coronary sinus pacing and recording. 3. Three-dimensional mapping of atrial fibrillation with additional mapping and ablation of discrete foci within the left atrium 4. Ablation of atrial fibrillation with additional ablation of discrete foci within the left atrium 5 Intracardiac echocardiography. 6. Transseptal puncture of an intact septum. 7. Rotational Angiography with processing at an independent workstation 8. Arrhythmia induction with pacing 9.External cardioversion.  INTRODUCTION:  [name] [name] is a 71 y.o. female with a history of persistent atrial fibrillation who now presents for EP study and radiofrequency ablation.  The patient reports initially being diagnosed with atrial fibrillation in 2006 after presenting with symptomatic palpitations and fatgiue.  The patient has failed medical therapy with flecainide.  The patient therefore presents today for catheter ablation of atrial fibrillation.  DESCRIPTION OF PROCEDURE:  Informed written consent was obtained, and the patient was brought to the electrophysiology lab in a fasting state.  The patient was adequately sedated with intravenous medications as outlined in the anesthesia report.  The patient's left and right groins were prepped and draped in the usual sterile fashion by the EP lab staff.  Using a percutaneous Seldinger technique, two 7-French and one 11-French hemostasis sheaths were placed into the right common femoral vein.   3 Dimensional Rotational Angiography: A 5 french pigtail catheter was introduced through the right common femoral vein and advanced into the inferior venocava.  3 demential rotational angiography was then performed by power injection of 100cc of nonionic contrast.  Reprocessing at an independent  work station was then performed.   This demonstrated a moderate sized left atrium with 4 pulmonary veins which were also moderate in size.   There was a large common ostium to the left pulmonary veins.   A 3 dimensional rendering of the left atrium was then merged using [employer] onto the Well[employer] system and registered with intracardiac echo (see below).  The pigtail catheter was then removed.  Catheter Placement:  A 7-French Biosense Webster Decapolar coronary sinus catheter was introduced through the right common femoral vein and advanced into the coronary sinus for recording and pacing from this location.  A 6-French quadripolar Josephson catheter was introduced through the right common femoral vein and advanced into the right ventricle for recording and pacing.  This catheter was then pulled back to the His bundle location.    Initial Measurements: The patient presented to the electrophysiology lab in atrial fibrillation.  Her average RR interval measured 725 msec.  The HV interval 49 msec.     Intracardiac Echocardiography: A 10-French Biosense Webster AcuNav intracardiac echocardiography catheter was introduced through the left common femoral vein and advanced into the right atrium. Intracardiac echocardiography was performed of the left atrium, and a three-dimensional anatomical rendering of the left atrium was performed using CARTO sound technology.  The patient was noted to have a moderate sized left atrium.  The interatrial septum was noted to have a small PFO. There was a large common ostium to the left pulmonary veins.  The pulmonary veins were moderate in size.  The left atrial appendage was visualized and did not reveal thrombus.   There was no evidence of pulmonary vein stenosis.   Transseptal Puncture: The middle right common femoral vein sheath was exchanged for an 8.5 [place] SL2 transseptal sheath and transseptal access was achieved in a standard fashion using a  Brockenbrough needle under biplane fluoroscopy with intracardiac echocardiography confirmation of the transseptal puncture.  Once transseptal access had been achieved, heparin was administered intravenously and intra- arterially in order to maintain an ACT of greater than 300 seconds throughout the procedure.   3D Mapping and Ablation: The His bundle catheter was removed and in its place a 3.5 mm Biosense Webster EZ [employer] ablation catheter was advanced into the right atrium.  The transseptal sheath was pulled back into the IVC over a guidewire.  The ablation catheter was advanced across the transseptal hole using the wire as a guide.  The transseptal sheath was then re-advanced over the guidewire into the left atrium.  A duodecapolar Biosense Webster circular mapping catheter was introduced through the transseptal sheath and positioned over the mouth of all 4 pulmonary veins.  Three-dimensional electroanatomical mapping was performed using CARTO technology.  This demonstrated electrical activity within all four pulmonary veins at baseline. The patient underwent successful sequential electrical isolation and anatomical encircling of all four pulmonary veins using radiofrequency current with a circular mapping catheter as a guide.  The left superior and inferior veins were isolated together at their common ostium.  Wide atrial circumferential ablation technique was used to isolate the pulmonary veins.  Additional mapping/ ablation of discrete left atrial foci: There was a relative paucity of electrical activity throughout the left atrium.  Complex fractionated electrograms (CFAEs) were then identified and ablated along the roof of the left atrium, base of the left atrial appendage (LAA),  along the ridge between the left superior pulmonary vein and the left atrial appendage,  along the lateral wall of the left atrium, and above the coronary sinus.     Cardioversion: An initial synchronized 360J  biphasic shock was unsuccessful in cardioverting the patient to sinus rhythm.  A second synchronized  360J biphasic shock was therefore delivered and the patient was then cardioverted to sinus rhythm with a with cardioversion electrodes in the anterior-posterior thoracic configuration. She remained in sinus rhythm thereafter.  Measurements Following Ablation: In sinus rhythm with RR interval was 970, with PR , QRS 105 msec, and Qtc 490 msec.  Following ablation the AH interval measured with an HV interval of 42 msec. Ventricular pacing was performed, which revealed midline decremental VA conduction with a VA Wenckebach cycle length of 460 msec.  Rapid atrial pacing was performed, which revealed an AV Wenckebach cycle length of 560 msec. Rapid atrial pacing was continued down to a cycle length of with no arrhythmias induced.  Electroisolation was then again confirmed in all four pulmonary veins.  The procedure was therefore considered completed.  All catheters were removed, and the sheaths were aspirated and flushed.  The patient was transferred to the recovery area for sheath removal per protocol.  A limited bedside transthoracic echocardiogram revealed no pericardial effusion.  There were no early apparent complications.  CONCLUSIONS: 1. Atrial fibrillation upon presentation.   2. Rotational Angiography reveals a moderate sized left atrium with four pulmonary veins without evidence of pulmonary vein stenosis.  There was a large common ostium to the left superior and inferior PVs. 3. Successful electrical isolation and anatomical encircling of all four pulmonary veins with radiofrequency current using a WACA approach. 3. CFAEs were identified and ablated along the roof of the left atrium, base of the left atrial appendage (LAA),  along the ridge between the left superior pulmonary vein and the left atrial appendage, along the lateral wall of the left atrium, and above  the coronary  sinus.   4. Atrial fibrillation successfully cardioverted to sinus rhythm. 5. No early apparent complications.   Fayrene Fearing Vannie Hilgert,MD 10:32 AM 11/11/2011

## 2011-11-11 NOTE — Anesthesia Postprocedure Evaluation (Signed)
  Anesthesia Post-op Note  Patient: [name] [name]  Procedure(s) Performed: Procedure(s) (LRB) with comments: ATRIAL FIBRILLATION ABLATION (N/A)  Patient Location: PACU  Anesthesia Type: MAC  Level of Consciousness: awake  Airway and Oxygen Therapy: Patient Spontanous Breathing  Post-op Pain: none  Post-op Assessment: Post-op Vital signs reviewed  Post-op Vital Signs: stable  Complications: No apparent anesthesia complications

## 2011-11-12 ENCOUNTER — Encounter (HOSPITAL_COMMUNITY): Payer: Self-pay | Admitting: Physician Assistant

## 2011-11-12 DIAGNOSIS — K219 Gastro-esophageal reflux disease without esophagitis: Secondary | ICD-10-CM | POA: Diagnosis not present

## 2011-11-12 DIAGNOSIS — I4891 Unspecified atrial fibrillation: Secondary | ICD-10-CM | POA: Diagnosis not present

## 2011-11-12 DIAGNOSIS — G20 Parkinson's disease: Secondary | ICD-10-CM | POA: Diagnosis not present

## 2011-11-12 DIAGNOSIS — E785 Hyperlipidemia, unspecified: Secondary | ICD-10-CM | POA: Diagnosis not present

## 2011-11-12 DIAGNOSIS — I10 Essential (primary) hypertension: Secondary | ICD-10-CM | POA: Diagnosis not present

## 2011-11-12 DIAGNOSIS — Z7901 Long term (current) use of anticoagulants: Secondary | ICD-10-CM | POA: Diagnosis not present

## 2011-11-12 LAB — BASIC METABOLIC PANEL
Chloride: 100 mEq/L (ref 96–112)
GFR calc Af Amer: >90 mL/min (ref >90)
Potassium: 4.4 mEq/L (ref 3.5–5.1)

## 2011-11-12 MED ORDER — OMEPRAZOLE 20 MG PO CPDR: 20.0000 mg | DELAYED_RELEASE_CAPSULE | Freq: Every day | ORAL | Status: DC

## 2011-11-12 MED ORDER — METOPROLOL SUCCINATE ER 25 MG PO TB24: 25.0000 mg | ORAL_TABLET | Freq: Every day | ORAL | Status: DC

## 2011-11-12 MED ORDER — FLECAINIDE ACETATE 100 MG PO TABS: 100.0000 mg | ORAL_TABLET | Freq: Two times a day (BID) | ORAL | Status: DC

## 2011-11-12 NOTE — Progress Notes (Signed)
Pt is s/p afib ablation.  Dr Graciela Husbands to see and hopefully discharge this am.  Would plan to resume xarelto uninterrupted until I see her again in 3 months Restart flecainide 100mg  BID Metoprolol succinate 25mg  daily prilosec daily x 6 weeks   Follow-up with me in 12 weeks  Jarold Song

## 2011-11-12 NOTE — Progress Notes (Signed)
Pt to discharge area via w/c with unit Tech

## 2011-11-12 NOTE — Discharge Summary (Signed)
Discharge Summary   Patient ID: [name] [name] MRN: [id number], DOB/AGE: 1940-09-25 71 y.o. Admit date: 11/11/2011 D/C date:     11/12/2011  Primary Cardiologist: Dr. Elease Hashimoto ([facility])  Primary Discharge Diagnoses:  1. Persistent atrial fibrillation - intially diagnosed in 2006 - s/p multiple cardioversions, failed med rx with metoprolol and flecainide previously (see HPI) - s/p atrial fibrillation ablation 11/11/11, restarted on Toprol and flecainide  Secondary Discharge Diagnoses:  1. HTN 2. HL - history of leg weakness while on statin 3. Parkinson's disease 4. GERD 5. Arthritis  Hospital Course: 71 y/o F with history of atrial fibrillation presented to Dr. Elease Hashimoto on 11/10/11 for followup. She was initially diagnosed with atrial fibrillation January 2006 after presenting to Dr Waynard Edwards with tachypalpitations following a viral GI illness. She was initiated on anticoagulation and cardioverted in 2006. She returned to afib several months later and was again cardioverted. She was treated with metoprolol and maintained sinus rhythm for over a year. She was evaluated and [facility] and initiated on flecainide. She did "great" on flecainide for several years. In 2010 she developed recurrent afib and required another cardioverted. She had a normal stress Myoview study in May 2010. She did well until June 2013 when she developed recurrent afib while at the beach. She was initiated on xarelto and underwent unsuccessful cardioversion 09/12/11. She had remained in afib since that time. She did note to Dr. Elease Hashimoto two episodes of vague chest tightness in the last month while walking but in general walks 4x a week for 50 minutes at time. She reported symptoms of palpitations, fatigue and decreased exercise tolerance. It was felt that her symptoms were related to her atrial fibrillation. She underwent TEE on 11/10/11 which was negative for thrombus, and showed normal LV function. She was referred to Dr.  Johney Frame the following day for EP evaluation. He felt that thus far she has failed medical therapy with flecainide and metoprolol. She did not have any significant structural heart disease. She had been maintained on anticoagulation appropriately. She was felt to be a good candidate for atrial fibrillation ablation which she underwent on 11/11/11. The patient tolerated the procedure well. Dr. Johney Frame has recommended to resume Xarelto uninterrupted until seen again in 3 months, to restart flecainide 100mg  BID, metoprolol succinate 25mg  daily and Prilosec for 6 weeks. Dr. Graciela Husbands has seen and examined the patient and feels she is stable for discharge.  Discharge Vitals: Blood pressure 134/88, pulse 81, temperature 98.4 F (36.9 C), temperature source Oral, resp. rate 18, height 5\' 2"  (1.575 m), weight 156 lb 12 oz (71.1 kg), SpO2 93.00%.  Labs: Lab Results  Component Value Date   WBC 5.0 11/03/2011   HGB 12.4 11/03/2011   HCT 36.9 11/03/2011   MCV 92.8 11/03/2011   PLT 185.0 11/03/2011    Lab 11/12/11 0500  NA 134*  K 4.4  CL 100  CO2 27  BUN 11  CREATININE 0.71  CALCIUM 9.1  PROT --  BILITOT --  ALKPHOS --  ALT --  AST --  GLUCOSE 110*    Diagnostic Studies/Procedures   11/11/11 PROCEDURES:  1. Comprehensive electrophysiologic study.  2. Coronary sinus pacing and recording.  3. Three-dimensional mapping of atrial fibrillation with additional mapping and ablation of discrete foci within the left atrium  4. Ablation of atrial fibrillation with additional ablation of discrete foci within the left atrium  5 Intracardiac echocardiography.  6. Transseptal puncture of an intact septum.  7. Rotational Angiography with processing  at an independent workstation  8. Arrhythmia induction with pacing  9.External cardioversion.   Discharge Medications   Current Discharge Medication List    START taking these medications   Details  flecainide (TAMBOCOR) 100 MG tablet Take 1 tablet (100 mg  total) by mouth every 12 (twelve) hours. Qty: 60 tablet, Refills: 3    omeprazole (PRILOSEC) 20 MG capsule Take 1 capsule (20 mg total) by mouth daily. Take for 6 weeks Qty: 42 capsule, Refills: 0      CONTINUE these medications which have CHANGED   Details  metoprolol succinate (TOPROL-XL) 25 MG 24 hr tablet Take 1 tablet (25 mg total) by mouth daily. Qty: 30 tablet, Refills: 6      CONTINUE these medications which have NOT CHANGED   Details  B Complex Vitamins (B COMPLEX PO) Take 1 tablet by mouth daily.     CALCIUM PO Take 1 tablet by mouth daily.     Calcium Polycarbophil (FIBERCON PO) Take 1 tablet by mouth daily.     Coenzyme Q10 (CO Q 10 PO) Take 1 tablet by mouth daily.     Docusate Sodium (COLACE PO) Take 1 capsule by mouth 2 (two) times daily.     ezetimibe (ZETIA) 10 MG tablet Take 10 mg by mouth every other day. Taking the same day as the crestor    losartan (COZAAR) 25 MG tablet Take 25 mg by mouth daily.      LUTEIN PO Take 1 tablet by mouth daily.    MAGNESIUM PO Take 1 tablet by mouth daily.     Multiple Vitamin (MULTIVITAMIN PO) Take 1 tablet by mouth daily.     Omega-3 Fatty Acids (FISH OIL PO) Take 2 capsules by mouth daily.     rasagiline (AZILECT) 1 MG TABS Take 1 mg by mouth daily.    Rivaroxaban (XARELTO) 20 MG TABS Take 20 mg by mouth daily.    rosuvastatin (CRESTOR) 5 MG tablet Take 2.5 mg by mouth every other day.    Vitamin D, Ergocalciferol, (DRISDOL) 50000 UNITS CAPS Take 50,000 Units by mouth every 7 (seven) days.         Disposition   The patient will be discharged in stable condition to home. Discharge Orders    Future Appointments: Provider: Department: Dept Phone: Center:   11/24/2011 10:00 AM [facility] [phone] [facility]   01/22/2012 8:30 AM Vesta Mixer, MD [facility] [phone] [facility]   02/09/2012 10:00 AM Hillis Range, MD [facility] [phone]  [facility]     Future Orders Please Complete By Expires   Diet - low sodium heart healthy      Increase activity slowly      Comments:   No driving for 3 days. No lifting over 5 lbs for 1 week. No sexual activity for 1 week. Keep procedure site clean & dry. If you notice increased pain, swelling, bleeding or pus, call/return!  You may shower, but no soaking baths/hot tubs/pools for 1 week.     Follow-up Information    Follow up with Hillis Range, MD. On 02/09/2012. (At 10:00 AM)    Contact information:   [address], [address] [place] [address] [phone]       Follow up with [facility]. (Please come in for an EKG at Dr. Jenel Lucks office on 11/24/11 at 10am)    Contact information:   [address] [place] [address]  Duration of Discharge Encounter: Greater than 30 minutes including physician and PA time.  Signed, Zanya Lindo PA-C 11/12/2011, 9:22 AM

## 2011-11-12 NOTE — Progress Notes (Signed)
  Patient Name: [name] [name]      SUBJECTIVE: s/p af ablation with extensie work yesterday; without chest or groin pain or sob  Past Medical History  Diagnosis Date  . PAF (paroxysmal atrial fibrillation)     has had prior cardioversion in 2010; on Flecainide  . Hypertension   . Hyperlipidemia   . Leg fracture Dec 21,2011    recent   . Hip fracture 11-08-2008    Recent  . Chronic anticoagulation     on Xarelto  . High risk medication use     on Flecainide  . GERD (gastroesophageal reflux disease)   . Parkinson's disease   . Arthritis     lt thumb    PHYSICAL EXAM Filed Vitals:   11/12/11 0043 11/12/11 0325 11/12/11 0354 11/12/11 0731  BP:  134/88    Pulse:  81    Temp: 98.7 F (37.1 C)  97.9 F (36.6 C) 98.4 F (36.9 C)  TempSrc: Oral  Oral Oral  Resp:  18  18  Height:      Weight:      SpO2:  93%     Well developed and nourished in no acute distress HENT normal Neck supple   Groin ok Clear No rub Regular rate and rhythm, no murmurs or gallops Abd-soft with active BS  No Clubbing cyanosis edema Skin-warm and dry A & Oriented  Grossly normal sensory and motor function \ TELEMETRY: Reviewed telemetry pt in sinus:    Intake/Output Summary (Last 24 hours) at 11/12/11 0815 Last data filed at 11/11/11 1300  Gross per 24 hour  Intake    740 ml  Output      0 ml  Net    740 ml    LABS: Basic Metabolic Panel:  Lab 11/12/11 4098  NA 134*  K 4.4  CL 100  CO2 27  GLUCOSE 110*  BUN 11  CREATININE 0.71  CALCIUM 9.1  MG --  PHOS --   Cardiac Enzymes: No results found for this basename: CKTOTAL:3,CKMB:3,CKMBINDEX:3,TROPONINI:3 in the last 72 hours CBC: No results found for this basename: WBC:7,NEUTROABS:7,HGB:7,HCT:7,MCV:7,PLT:7 in the last 168 hours PROTIME: No results found for this basename: LABPROT:3,INR:3 in the last 72 hours Liver Function Tests: No results found for this basename: AST:2,ALT:2,ALKPHOS:2,BILITOT:2,PROT:2,ALBUMIN:2 in the  last 72 hours No results found for this basename: LIPASE:2,AMYLASE:2 in the last 72 hours BNP: BNP (last 3 results) No results found for this basename: PROBNP:3 in the last 8760 hours D-Dimer: No results found for this basename: DDIMER:2 in the last 72 hours Hemoglobin A1C: No results found for this basename: HGBA1C in the last 72 hours Fasting Lipid Panel: No results found for this basename: CHOL,HDL,LDLCALC,TRIG,CHOLHDL,LDLDIRECT in the last 72 hours Thyroid Function Tests: No results found for this basename: TSH,T4TOTAL,FREET3,T3FREE,THYROIDAB in the last 72 hours Anemia Panel: No results found for this basename: VITAMINB12,FOLATE,FERRITIN,TIBC,IRON,RETICCTPCT in the last 72 hours       ASSESSMENT AND PLAN:   AF post ablation  Discharge on toprol 25 mg and flec 100 bid F/u JA 2 weeks Instructions given Signed, Sherryl Manges MD  11/12/2011

## 2011-11-13 ENCOUNTER — Other Ambulatory Visit: Payer: Self-pay | Admitting: Internal Medicine

## 2011-11-13 DIAGNOSIS — Z1231 Encounter for screening mammogram for malignant neoplasm of breast: Secondary | ICD-10-CM

## 2011-11-24 ENCOUNTER — Ambulatory Visit (INDEPENDENT_AMBULATORY_CARE_PROVIDER_SITE_OTHER): Payer: Medicare Other | Admitting: *Deleted

## 2011-11-24 DIAGNOSIS — I4891 Unspecified atrial fibrillation: Secondary | ICD-10-CM

## 2011-11-24 NOTE — Progress Notes (Signed)
Pt here for EKG. Taking flecainide as prescribed. States she has not taken it yet this AM.  States she is feeling well but thinks heart may be out of rhythm.  EKG done and evaluated by Dr. Johney Frame. Pt with atypical flutter. Continue same meds and repeat EKG in 2 weeks.  Pt given this information and will come back on November 5,2013 for EKG.

## 2011-11-25 NOTE — Addendum Note (Signed)
Addended by: Marrion Coy L on: 11/25/2011 02:29 PM   Modules accepted: Orders

## 2011-12-09 ENCOUNTER — Ambulatory Visit (INDEPENDENT_AMBULATORY_CARE_PROVIDER_SITE_OTHER): Payer: Medicare Other | Admitting: *Deleted

## 2011-12-09 VITALS — BP 118/72 | HR 65 | Resp 18

## 2011-12-09 DIAGNOSIS — I4891 Unspecified atrial fibrillation: Secondary | ICD-10-CM

## 2011-12-09 NOTE — Patient Instructions (Signed)
Pt came in today for follow up EKG which demonstrates AFib with a rate of 65.  She states for the most part she feels fine.  She is only having SOB and fatigue with exertion like walking up stairs.  Will forward information to Dr Johney Frame for his review.

## 2011-12-22 ENCOUNTER — Ambulatory Visit
Admission: RE | Admit: 2011-12-22 | Discharge: 2011-12-22 | Disposition: A | Discharge status: Home / Self Care | Payer: Medicare Other | Source: Ambulatory Visit | Attending: Internal Medicine | Admitting: Internal Medicine

## 2011-12-22 DIAGNOSIS — Z1231 Encounter for screening mammogram for malignant neoplasm of breast: Secondary | ICD-10-CM | POA: Diagnosis not present

## 2012-01-04 LAB — HM DEXA SCAN

## 2012-01-09 DIAGNOSIS — M899 Disorder of bone, unspecified: Secondary | ICD-10-CM | POA: Diagnosis not present

## 2012-01-09 DIAGNOSIS — E785 Hyperlipidemia, unspecified: Secondary | ICD-10-CM | POA: Diagnosis not present

## 2012-01-09 DIAGNOSIS — Z0000 Encounter for general adult medical examination without abnormal findings: Secondary | ICD-10-CM | POA: Diagnosis not present

## 2012-01-09 DIAGNOSIS — R5381 Other malaise: Secondary | ICD-10-CM | POA: Diagnosis not present

## 2012-01-13 DIAGNOSIS — G20 Parkinson's disease: Secondary | ICD-10-CM | POA: Diagnosis not present

## 2012-01-14 DIAGNOSIS — H40019 Open angle with borderline findings, low risk, unspecified eye: Secondary | ICD-10-CM | POA: Diagnosis not present

## 2012-01-14 DIAGNOSIS — H2510 Age-related nuclear cataract, unspecified eye: Secondary | ICD-10-CM | POA: Diagnosis not present

## 2012-01-19 ENCOUNTER — Other Ambulatory Visit: Payer: Self-pay | Admitting: Nurse Practitioner

## 2012-01-20 DIAGNOSIS — M810 Age-related osteoporosis without current pathological fracture: Secondary | ICD-10-CM | POA: Diagnosis not present

## 2012-01-20 DIAGNOSIS — E785 Hyperlipidemia, unspecified: Secondary | ICD-10-CM | POA: Diagnosis not present

## 2012-01-20 DIAGNOSIS — G20 Parkinson's disease: Secondary | ICD-10-CM | POA: Diagnosis not present

## 2012-01-20 DIAGNOSIS — I10 Essential (primary) hypertension: Secondary | ICD-10-CM | POA: Diagnosis not present

## 2012-01-20 DIAGNOSIS — R5381 Other malaise: Secondary | ICD-10-CM | POA: Diagnosis not present

## 2012-01-22 ENCOUNTER — Ambulatory Visit (INDEPENDENT_AMBULATORY_CARE_PROVIDER_SITE_OTHER): Payer: Medicare Other | Admitting: Cardiovascular Disease

## 2012-01-22 ENCOUNTER — Encounter: Payer: Self-pay | Admitting: Cardiovascular Disease

## 2012-01-22 VITALS — BP 123/82 | HR 92 | Ht 62.0 in | Wt 149.0 lb

## 2012-01-22 DIAGNOSIS — I4891 Unspecified atrial fibrillation: Secondary | ICD-10-CM | POA: Diagnosis not present

## 2012-01-22 DIAGNOSIS — I10 Essential (primary) hypertension: Secondary | ICD-10-CM | POA: Diagnosis not present

## 2012-01-22 DIAGNOSIS — R079 Chest pain, unspecified: Secondary | ICD-10-CM | POA: Diagnosis not present

## 2012-01-22 MED ORDER — METOPROLOL SUCCINATE ER 50 MG PO TB24: 50.0000 mg | ORAL_TABLET | Freq: Every day | ORAL | Status: DC

## 2012-01-22 NOTE — Progress Notes (Signed)
[name] [name]  Date of Birth  [birth year]   [facility] [address]    [address] [place], [place]  [address] [phone]  Fax  [phone]  Problems: 1. Atrial Fibrillation - s/p atrial fibrillation ablation 11/11/11, restarted on Toprol and flecainide 2. Hypertension 3. Hyperlipidemia 4. Parkinson's    History of Present Illness:  71 year old female with a history of atrial fibrillation. Her heart rate has been fairly well controlled on flecainide 100 mg twice a day . She has episodes of severe fatigue after she takes her medications.    Pt has had some chest pain with walking over the past several months. She had a normal stress Myoview study in May, 2010. She has had a few episodes of atrial ablation a typically occur when she's dehydrated. She is modestly better hydrated and this seems to make his episodes of atrial fibrillation.    She typically walks 50 minutes a day-4 times a week.  She's had 2 episodes of chest pressure while walking. The episode started with exercise and was relieved when she stops to rest.  She stopped her statin because of leg weakness.  She feels a bit better off the statin.  Dec. 19, 2013 She has had an atrial fibrillation ablation in October, 2013. She still is in atrial fibrillation. She is a little disappointed about this.  I told her that it was not uncommon to have some persistent atrial fibrillation for the first several months after an ablation. We will likely need to cardiovert her again. She was pleased to hear this.  She continues to have lots of problems related to her Parkinson's disease and the medicines used to treat Parkinson's. This seems to be her main limiting factor.     Current Outpatient Prescriptions on File Prior to Visit  Medication Sig Dispense Refill  . B Complex Vitamins (B COMPLEX PO) Take 1 tablet by mouth daily.       [name] CALCIUM PO Take 1 tablet by mouth daily.       . Calcium Polycarbophil (FIBERCON PO)  Take 1 tablet by mouth daily.       . Coenzyme Q10 (CO Q 10 PO) Take 1 tablet by mouth daily.       Tery Sanfilippo Sodium (COLACE PO) Take 1 capsule by mouth 2 (two) times daily.       [name] ezetimibe (ZETIA) 10 MG tablet Take 10 mg by mouth every other day. Taking the same day as the crestor      . flecainide (TAMBOCOR) 100 MG tablet Take 1 tablet (100 mg total) by mouth every 12 (twelve) hours.  60 tablet  3  . losartan (COZAAR) 25 MG tablet Take 25 mg by mouth daily.        . LUTEIN PO Take 1 tablet by mouth daily.      [name] MAGNESIUM PO Take 1 tablet by mouth daily.       . metoprolol succinate (TOPROL-XL) 25 MG 24 hr tablet Take 1 tablet (25 mg total) by mouth daily.  30 tablet  6  . Multiple Vitamin (MULTIVITAMIN PO) Take 1 tablet by mouth daily.       . Omega-3 Fatty Acids (FISH OIL PO) Take 2 capsules by mouth daily.       [name] omeprazole (PRILOSEC) 20 MG capsule Take 20 mg by mouth as needed.      . rasagiline (AZILECT) 1 MG TABS Take 1 mg by mouth daily.      [name]  Rivaroxaban (XARELTO) 20 MG TABS Take 20 mg by mouth daily.      . rosuvastatin (CRESTOR) 5 MG tablet Take 2.5 mg by mouth every other day.      . Vitamin D, Ergocalciferol, (DRISDOL) 50000 UNITS CAPS Take 50,000 Units by mouth every 7 (seven) days.       Carlena Hurl 20 MG TABS TAKE 1 TABLET (20 MG TOTAL) BY MOUTH DAILY.  30 tablet  3    Allergies  Allergen Reactions  . Statins     Leg weakness - but tolerating low dose Crestor every other day    Past Medical History  Diagnosis Date  . PAF (paroxysmal atrial fibrillation)     s/p atrial fibrillation ablation 11/11/11, restarted on Toprol and flecainide  . Hypertension   . Hyperlipidemia     a. Hx of leg weakness while on statin.  . Leg fracture Dec 21,2011  . Hip fracture 11-08-2008  . Chronic anticoagulation     on Xarelto  . High risk medication use     on Flecainide  . GERD (gastroesophageal reflux disease)   . Parkinson's disease   . Arthritis     lt thumb    Past  Surgical History  Procedure Date  . Knee surgery     2  . US echocardiography 03-10-2008    Est EF 55-60%  . Cardiovascular stress test 06-06-2008    EF 73%  . Cholecystectomy   . Tonsillectomy   . Wrist surgery   . Cardioversion 09/09/2011    Procedure: CARDIOVERSION;  Surgeon: Cassell Clement, MD;  Location[facility] END[facility]COPY;  Service: Cardiovascular;  Laterality: N/A;  to be done by dr. Patty Sermons  . Tee without cardioversion 11/10/2011    Procedure: TRANSESOPHAGEAL ECHOCARDIOGRAM (TEE);  Surgeon: Vesta Mixer, MD;  Location: [facility] ENDOSCOPY;  Service: Cardiovascular;  Laterality: N/A;    History  Smoking status  . Never Smoker   Smokeless tobacco  . Not on file    History  Alcohol Use  . 4.8 oz/week  . 8 Glasses of wine per week    Comment: wine x2 per day    Family History  Problem Relation Age of Onset  . Alzheimer's disease Mother     Reviw of Systems:  Reviewed in the HPI.  All other systems are negative.  Physical Exam: BP 123/82  Pulse 92  Ht 5\' 2"  (1.575 m)  Wt 149 lb (67.586 kg)  BMI 27.25 kg/m2 The patient is alert and oriented x 3.  The mood and affect are normal.   Skin: warm and dry.  Color is normal.    HEENT:   the sclera are nonicteric.  The mucous membranes are moist.  The carotids are 2+ without bruits.  There is no thyromegaly.  There is no JVD.    Lungs: clear.  The chest wall is non tender.    Heart: Irregularly irregular with a normal S1 and S2.  There are no murmurs, gallops, or rubs. The PMI is not displaced.     Abdomen: good bowel sounds.  There is no guarding or rebound.  There is no hepatosplenomegaly or tenderness.  There are no masses.   Extremities:  no clubbing, cyanosis, or edema.  The legs are without rashes.  The distal pulses are intact.   Neuro:  Cranial nerves II - XII are intact.  Motor and sensory functions are intact.    The gait is normal.  ECG: Dec. 19, 2013 - Atrial fib at  88.  Assessment / Plan:

## 2012-01-22 NOTE — Assessment & Plan Note (Signed)
[name] [name] seems to be doing fairly well. She does have some shortness breath when climbing stairs. She had an A. fib ablation on October 18.  She remains in atrial fibrillation.  I think there is still some hope that she may be able to be cardioverted in several months. I'll see her again in 3 months for followup visit. We will probably want to try to cardiovert her at that time. We will have Dr. Johney Frame weight in on this.  We will increase her metoprolol to 50 mg a day. This should help with her rate control in may illuminate some shortness breath that she has climbing stairs.

## 2012-01-22 NOTE — Patient Instructions (Addendum)
Your physician recommends that you schedule a follow-up appointment in: 3 months with Dr. Elease Hashimoto & an EKG  March 11,2014  Increase your Metoprolol to 50 mg daily

## 2012-02-09 ENCOUNTER — Ambulatory Visit (INDEPENDENT_AMBULATORY_CARE_PROVIDER_SITE_OTHER): Payer: Medicare Other | Admitting: Internal Medicine

## 2012-02-09 ENCOUNTER — Other Ambulatory Visit: Payer: Self-pay | Admitting: *Deleted

## 2012-02-09 ENCOUNTER — Encounter: Payer: Self-pay | Admitting: Internal Medicine

## 2012-02-09 ENCOUNTER — Encounter: Payer: Self-pay | Admitting: *Deleted

## 2012-02-09 VITALS — BP 112/68 | HR 62 | Ht 62.0 in | Wt 149.0 lb

## 2012-02-09 DIAGNOSIS — I10 Essential (primary) hypertension: Secondary | ICD-10-CM | POA: Diagnosis not present

## 2012-02-09 DIAGNOSIS — I4891 Unspecified atrial fibrillation: Secondary | ICD-10-CM

## 2012-02-09 LAB — BASIC METABOLIC PANEL
CO2: 30 mEq/L (ref 19–32)
Calcium: 9.2 mg/dL (ref 8.4–10.5)
Chloride: 99 mEq/L (ref 96–112)
Potassium: 4.5 mEq/L (ref 3.5–5.1)
Sodium: 136 mEq/L (ref 135–145)

## 2012-02-09 LAB — CBC WITH DIFFERENTIAL/PLATELET
Basophils Relative: 0.8 % (ref 0.0–3.0)
Eosinophils Absolute: 0.1 10*3/uL (ref 0.0–0.7)
HCT: 39.2 % (ref 36.0–46.0)
Hemoglobin: 13.2 g/dL (ref 12.0–15.0)
Lymphocytes Relative: 24.2 % (ref 12.0–46.0)
MCHC: 33.7 g/dL (ref 30.0–36.0)
MCV: 91.8 fl (ref 78.0–100.0)
Neutro Abs: 2.6 10*3/uL (ref 1.4–7.7)
RBC: 4.27 Mil/uL (ref 3.87–5.11)

## 2012-02-09 NOTE — Assessment & Plan Note (Signed)
ERAF s/p afib ablation Risks, benefit, and alternative to cardioversion were discussed in detail with the patient who wishes to proceed.  Will therefore plan cardioversion at the next available time.  She reports compliance with xarelto. IF she has further afib, then we will need to switch from flecainide to either tikosyn or amiodarone.   She will return for further assessment by me in 4-6 weeks

## 2012-02-09 NOTE — Addendum Note (Signed)
Addended by: Dennis Bast F on: 02/09/2012 10:35 AM   Modules accepted: Orders

## 2012-02-09 NOTE — Progress Notes (Signed)
PCP:  Ezequiel Kayser, MD Primary Cardiologist:  Dr Elease Hashimoto  The patient presents today for routine electrophysiology followup.  Since her afib ablation, the patient reports doing very well. She denies procedure related complications.  Unfortunately, she has returned to afib during the early healing phase. Today, she denies symptoms of chest pain, shortness of breath, orthopnea, PND, lower extremity edema, dizziness, presyncope, syncope, or neurologic sequela.  The patient feels that she is tolerating medications without difficulties and is otherwise without complaint today.   Past Medical History  Diagnosis Date  . PAF (paroxysmal atrial fibrillation)     s/p atrial fibrillation ablation 11/11/11, restarted on Toprol and flecainide  . Hypertension   . Hyperlipidemia     a. Hx of leg weakness while on statin.  . Leg fracture Dec 21,2011  . Hip fracture 11-08-2008  . Chronic anticoagulation     on Xarelto  . High risk medication use     on Flecainide  . GERD (gastroesophageal reflux disease)   . Parkinson's disease   . Arthritis     lt thumb   Past Surgical History  Procedure Date  . Knee surgery     2  . US echocardiography 03-10-2008    Est EF 55-60%  . Cardiovascular stress test 06-06-2008    EF 73%  . Cholecystectomy   . Tonsillectomy   . Wrist surgery   . Cardioversion 09/09/2011    Procedure: CARDIOVERSION;  Surgeon: Cassell Clement, MD;  Location: [facility] ENDOSCOPY;  Service: Cardiovascular;  Laterality: N/A;  to be done by dr. Patty Sermons  . Tee without cardioversion 11/10/2011    Procedure: TRANSESOPHAGEAL ECHOCARDIOGRAM (TEE);  Surgeon: Vesta Mixer, MD;  Location: [facility] ENDOSCOPY;  Service: Cardiovascular;  Laterality: N/A;    Current Outpatient Prescriptions  Medication Sig Dispense Refill  . B Complex Vitamins (B COMPLEX PO) Take 1 tablet by mouth daily.       [name] CALCIUM PO Take 1 tablet by mouth daily.       . Calcium Polycarbophil (FIBERCON PO) Take 1 tablet by mouth  daily.       . Coenzyme Q10 (CO Q 10 PO) Take 1 tablet by mouth daily.       Tery Sanfilippo Sodium (COLACE PO) Take 1 capsule by mouth 2 (two) times daily.       [name] ezetimibe (ZETIA) 10 MG tablet Take 10 mg by mouth every other day. Taking the same day as the crestor      . flecainide (TAMBOCOR) 100 MG tablet Take 1 tablet (100 mg total) by mouth every 12 (twelve) hours.  60 tablet  3  . losartan (COZAAR) 25 MG tablet Take 25 mg by mouth daily.        . LUTEIN PO Take 1 tablet by mouth daily.      [name] MAGNESIUM PO Take 1 tablet by mouth daily.       . metoprolol succinate (TOPROL-XL) 50 MG 24 hr tablet Take 1 tablet (50 mg total) by mouth daily.  30 tablet  6  . Multiple Vitamin (MULTIVITAMIN PO) Take 1 tablet by mouth daily.       . Omega-3 Fatty Acids (FISH OIL PO) Take 2 capsules by mouth daily.       [name] omeprazole (PRILOSEC) 20 MG capsule Take 20 mg by mouth as needed.      . rasagiline (AZILECT) 1 MG TABS Take 1 mg by mouth daily.      . Rivaroxaban (XARELTO) 20  MG TABS Take 20 mg by mouth daily.      . rosuvastatin (CRESTOR) 5 MG tablet Take 2.5 mg by mouth every other day.      . trihexyphenidyl (ARTANE) 2 MG tablet Take 1 mg by mouth 2 (two) times daily with a meal.      . Vitamin D, Ergocalciferol, (DRISDOL) 50000 UNITS CAPS Take 50,000 Units by mouth every 7 (seven) days.         Allergies  Allergen Reactions  . Statins     Leg weakness - but tolerating low dose Crestor every other day    History   Social History  . Marital Status: Married    Spouse Name: N/A    Number of Children: N/A  . Years of Education: N/A   Occupational History  . Not on file.   Social History Main Topics  . Smoking status: Never Smoker   . Smokeless tobacco: Not on file  . Alcohol Use: 4.8 oz/week    8 Glasses of wine per week     Comment: wine x2 per day  . Drug Use: No  . Sexually Active: Not Currently   Other Topics Concern  . Not on file   Social History Narrative   Lives in [place].   Retired Comptroller.    Family History  Problem Relation Age of Onset  . Alzheimer's disease Mother     ROS-  All systems are reviewed and are negative except as outlined in the HPI above   Physical Exam: Filed Vitals:   02/09/12 0959  BP: 112/68  Pulse: 62  Height: 5\' 2"  (1.575 m)  Weight: 149 lb (67.586 kg)  SpO2: 98%    GEN- The patient is well appearing, alert and oriented x 3 today.   Head- normocephalic, atraumatic Eyes-  Sclera clear, conjunctiva pink Ears- hearing intact Oropharynx- clear Neck- supple, no JVP Lymph- no cervical lymphadenopathy Lungs- Clear to ausculation bilaterally, normal work of breathing Heart- irregular rate and rhythm, no murmurs, rubs or gallops, PMI not laterally displaced GI- soft, NT, ND, + BS Extremities- no clubbing, cyanosis, or edema MS- no significant deformity or atrophy Skin- no rash or lesion Psych- euthymic mood, full affect Neuro- strength and sensation are intact  ekg today reveals afib, V rate 62 bpm, poor R wave progression  Assessment and Plan:

## 2012-02-09 NOTE — Assessment & Plan Note (Signed)
Stable No change required today  

## 2012-02-09 NOTE — Patient Instructions (Signed)
Your physician has recommended that you have a Cardioversion (DCCV). Electrical Cardioversion uses a jolt of electricity to your heart either through paddles or wired patches attached to your chest. This is a controlled, usually prescheduled, procedure. Defibrillation is done under light anesthesia in the hospital, and you usually go home the day of the procedure. This is done to get your heart back into a normal rhythm. You are not awake for the procedure. Please see the instruction sheet given to you today.    Your physician recommends that you schedule a follow-up appointment in: 4-6 weeks with Dr Johney Frame

## 2012-02-18 ENCOUNTER — Ambulatory Visit (HOSPITAL_COMMUNITY): Payer: Medicare Other | Admitting: Certified Registered Nurse Anesthetist

## 2012-02-18 ENCOUNTER — Encounter (HOSPITAL_COMMUNITY)
Admission: RE | Disposition: A | Discharge status: Home / Self Care | Payer: Self-pay | Source: Ambulatory Visit | Attending: Cardiovascular Disease

## 2012-02-18 ENCOUNTER — Ambulatory Visit (HOSPITAL_COMMUNITY)
Admission: RE | Admit: 2012-02-18 | Discharge: 2012-02-18 | Disposition: A | Discharge status: Home / Self Care | Payer: Medicare Other | Source: Ambulatory Visit | Attending: Cardiovascular Disease | Admitting: Cardiovascular Disease

## 2012-02-18 ENCOUNTER — Encounter (HOSPITAL_COMMUNITY): Payer: Self-pay | Admitting: Certified Registered Nurse Anesthetist

## 2012-02-18 ENCOUNTER — Encounter (HOSPITAL_COMMUNITY): Payer: Self-pay | Admitting: *Deleted

## 2012-02-18 DIAGNOSIS — E785 Hyperlipidemia, unspecified: Secondary | ICD-10-CM | POA: Insufficient documentation

## 2012-02-18 DIAGNOSIS — Z7902 Long term (current) use of antithrombotics/antiplatelets: Secondary | ICD-10-CM | POA: Insufficient documentation

## 2012-02-18 DIAGNOSIS — Z79899 Other long term (current) drug therapy: Secondary | ICD-10-CM | POA: Diagnosis not present

## 2012-02-18 DIAGNOSIS — K219 Gastro-esophageal reflux disease without esophagitis: Secondary | ICD-10-CM | POA: Diagnosis not present

## 2012-02-18 DIAGNOSIS — G20 Parkinson's disease: Secondary | ICD-10-CM | POA: Insufficient documentation

## 2012-02-18 DIAGNOSIS — Z7901 Long term (current) use of anticoagulants: Secondary | ICD-10-CM | POA: Insufficient documentation

## 2012-02-18 DIAGNOSIS — I10 Essential (primary) hypertension: Secondary | ICD-10-CM | POA: Diagnosis not present

## 2012-02-18 DIAGNOSIS — I4891 Unspecified atrial fibrillation: Secondary | ICD-10-CM | POA: Insufficient documentation

## 2012-02-18 DIAGNOSIS — G20A1 Parkinson's disease without dyskinesia, without mention of fluctuations: Secondary | ICD-10-CM | POA: Insufficient documentation

## 2012-02-18 HISTORY — PX: CARDIOVERSION: SHX1299

## 2012-02-18 SURGERY — CARDIOVERSION
Anesthesia: Monitor Anesthesia Care | Wound class: Clean

## 2012-02-18 MED ORDER — PROPOFOL 10 MG/ML IV BOLUS
INTRAVENOUS | Status: DC | PRN
  Administered 2012-02-18: 70 mg via INTRAVENOUS

## 2012-02-18 MED ORDER — SODIUM CHLORIDE 0.9 % IV SOLN
INTRAVENOUS | Status: DC
  Administered 2012-02-18: 500 mL via INTRAVENOUS

## 2012-02-18 MED ORDER — SODIUM CHLORIDE 0.9 % IV SOLN
INTRAVENOUS | Status: DC | PRN
  Administered 2012-02-18: 14:00:00 via INTRAVENOUS

## 2012-02-18 NOTE — Preoperative (Signed)
Beta Blockers   Reason not to administer Beta Blockers:Not Applicable 

## 2012-02-18 NOTE — Transfer of Care (Signed)
Immediate Anesthesia Transfer of Care Note  Patient: [name] [name]  Procedure(s) Performed: Procedure(s) (LRB) with comments: CARDIOVERSION (N/A)  Patient Location: PACU  Anesthesia Type:MAC  Level of Consciousness: awake, alert  and oriented  Airway & Oxygen Therapy: Patient Spontanous Breathing and Patient connected to nasal cannula oxygen  Post-op Assessment: Report given to PACU RN, Post -op Vital signs reviewed and stable and Patient moving all extremities X 4  Post vital signs: Reviewed and stable  Complications: No apparent anesthesia complications

## 2012-02-18 NOTE — Interval H&P Note (Signed)
History and Physical Interval Note:  02/18/2012 2:13 PM  [name] [name]  has presented today for surgery, with the diagnosis of a-fib  The various methods of treatment have been discussed with the patient and family. After consideration of risks, benefits and other options for treatment, the patient has consented to  Procedure(s) (LRB) with comments: CARDIOVERSION (N/A) as a surgical intervention .  The patient's history has been reviewed, patient examined, no change in status, stable for surgery.  I have reviewed the patient's chart and labs.  Questions were answered to the patient's satisfaction.     [name].

## 2012-02-18 NOTE — CV Procedure (Signed)
    Cardioversion Note  Berenice Oehlert [id number] [birth year]  Procedure: DC Cardioversion Indications: atrial fibrillation  Procedure Details Consent: Obtained Time Out: Verified patient identification, verified procedure, site/side was marked, verified correct patient position, special equipment/implants available, Radiology Safety Procedures followed,  medications/allergies/relevent history reviewed, required imaging and test results available.  Performed  The patient has been on adequate anticoagulation.  The patient received IV Propofol 70 mg  for sedation.  Synchronous cardioversion was performed at 120 joules.  The cardioversion was successful.    Complications: No apparent complications Patient did tolerate procedure well.  She has sinus bradycardia with a 1st degree AV block will DC metoprolol. Continue Flecainide.    Vesta Mixer, Montez Hageman., MD, [employer] 02/18/2012, 2:21 PM

## 2012-02-18 NOTE — H&P (Signed)
[name] [name]  Date of Birth [birth year]  [facility]  [address]  [place], [place] [address]  [phone] Fax [phone]  Problems:  1. Atrial Fibrillation - s/p atrial fibrillation ablation 11/11/11, restarted on Toprol and flecainide  2. Hypertension  3. Hyperlipidemia  4. Parkinson's  History of Present Illness:  72 year old female with a history of atrial fibrillation. Her heart rate has been fairly well controlled on flecainide 100 mg twice a day . She has episodes of severe fatigue after she takes her medications.  Pt has had some chest pain with walking over the past several months. She had a normal stress Myoview study in May, 2010.  She has had a few episodes of atrial ablation a typically occur when she's dehydrated. She is modestly better hydrated and this seems to make his episodes of atrial fibrillation.  She typically walks 50 minutes a day-4 times a week. She's had 2 episodes of chest pressure while walking. The episode started with exercise and was relieved when she stops to rest.  She stopped her statin because of leg weakness. She feels a bit better off the statin.  Dec. 19, 2013  She has had an atrial fibrillation ablation in October, 2013. She still is in atrial fibrillation. She is a little disappointed about this. I told her that it was not uncommon to have some persistent atrial fibrillation for the first several months after an ablation. We will likely need to cardiovert her again. She was pleased to hear this.  She continues to have lots of problems related to her Parkinson's disease and the medicines used to treat Parkinson's. This seems to be her main limiting factor.  Current Outpatient Prescriptions on File Prior to Visit   Medication  Sig  Dispense  Refill   .  B Complex Vitamins (B COMPLEX PO)  Take 1 tablet by mouth daily.     [name]  CALCIUM PO  Take 1 tablet by mouth daily.     .  Calcium Polycarbophil (FIBERCON PO)  Take 1 tablet by mouth  daily.     .  Coenzyme Q10 (CO Q 10 PO)  Take 1 tablet by mouth daily.     [name] [name] (COLACE PO)  Take 1 capsule by mouth 2 (two) times daily.     [name]  ezetimibe (ZETIA) 10 MG tablet  Take 10 mg by mouth every other day. Taking the same day as the crestor     .  flecainide (TAMBOCOR) 100 MG tablet  Take 1 tablet (100 mg total) by mouth every 12 (twelve) hours.  60 tablet  3   .  losartan (COZAAR) 25 MG tablet  Take 25 mg by mouth daily.     .  LUTEIN PO  Take 1 tablet by mouth daily.     [name]  MAGNESIUM PO  Take 1 tablet by mouth daily.     .  metoprolol succinate (TOPROL-XL) 25 MG 24 hr tablet  Take 1 tablet (25 mg total) by mouth daily.  30 tablet  6   .  Multiple Vitamin (MULTIVITAMIN PO)  Take 1 tablet by mouth daily.     .  Omega-3 Fatty Acids (FISH OIL PO)  Take 2 capsules by mouth daily.     [name]  omeprazole (PRILOSEC) 20 MG capsule  Take 20 mg by mouth as needed.     .  rasagiline (AZILECT) 1 MG TABS  Take 1 mg by mouth daily.     [name]  Rivaroxaban (XARELTO) 20 MG TABS  Take 20 mg by mouth daily.     .  rosuvastatin (CRESTOR) 5 MG tablet  Take 2.5 mg by mouth every other day.     .  Vitamin D, Ergocalciferol, (DRISDOL) 50000 UNITS CAPS  Take 50,000 Units by mouth every 7 (seven) days.     Carlena Hurl 20 MG TABS  TAKE 1 TABLET (20 MG TOTAL) BY MOUTH DAILY.  30 tablet  3    Allergies   Allergen  Reactions   .  Statins      Leg weakness - but tolerating low dose Crestor every other day    Past Medical History   Diagnosis  Date   .  PAF (paroxysmal atrial fibrillation)      s/p atrial fibrillation ablation 11/11/11, restarted on Toprol and flecainide   .  Hypertension    .  Hyperlipidemia      a. Hx of leg weakness while on statin.   .  Leg fracture  Dec 21,2011   .  Hip fracture  11-08-2008   .  Chronic anticoagulation      on Xarelto   .  High risk medication use      on Flecainide   .  GERD (gastroesophageal reflux disease)    .  Parkinson's disease    .  Arthritis      lt  thumb    Past Surgical History   Procedure  Date   .  Knee surgery      2   .  US echocardiography  03-10-2008     Est EF 55-60%   .  Cardiovascular stress test  06-06-2008     EF 73%   .  Cholecystectomy    .  Tonsillectomy    .  Wrist surgery    .  Cardioversion  09/09/2011     Procedure: CARDIOVERSION; Surgeon: Cassell Clement, MD; Location: [facility] ENDOSCOPY; Service: Cardiovascular; Laterality: N/A; to be done by dr. Patty Sermons   .  Tee without cardioversion  11/10/2011     Procedure: TRANSESOPHAGEAL ECHOCARDIOGRAM (TEE); Surgeon: Vesta Mixer, MD; Location: [facility] ENDOSCOPY; Service: Cardiovascular; Laterality: N/A;    History   Smoking status   .  Never Smoker   Smokeless tobacco   .  Not on file    History   Alcohol Use   .  4.8 oz/week   .  8 Glasses of wine per week     Comment: wine x2 per day    Family History   Problem  Relation  Age of Onset   .  Alzheimer's disease  Mother     Reviw of Systems:  Reviewed in the HPI. All other systems are negative.  Physical Exam:  BP 123/82  Pulse 92  Ht 5\' 2"  (1.575 m)  Wt 149 lb (67.586 kg)  BMI 27.25 kg/m2  The patient is alert and oriented x 3. The mood and affect are normal.  Skin: warm and dry. Color is normal.  HEENT: the sclera are nonicteric. The mucous membranes are moist. The carotids are 2+ without bruits. There is no thyromegaly. There is no JVD.  Lungs: clear. The chest wall is non tender.  Heart: Irregularly irregular with a normal S1 and S2. There are no murmurs, gallops, or rubs. The PMI is not displaced.  Abdomen: good bowel sounds. There is no guarding or rebound. There is no hepatosplenomegaly or tenderness. There are no masses.  Extremities: no clubbing, cyanosis,  or edema. The legs are without rashes. The distal pulses are intact.  Neuro: Cranial nerves II - XII are intact. Motor and sensory functions are intact.  The gait is normal.  ECG:  Dec. 19, 2013 - Atrial fib at 89.  Assessment / Plan:   A-fib -  [name]., MD 01/22/2012 9:30 AM Signed  [name] [name] seems to be doing fairly well. She does have some shortness breath when climbing stairs. She had an A. fib ablation on October 18. She remains in atrial fibrillation.  I think there is still some hope that she may be able to be cardioverted in several months. I'll see her again in 3 months for followup visit. We will probably want to try to cardiovert her at that time. We will have Dr. Johney Frame weight in on this.  We will increase her metoprolol to 50 mg a day. This should help with her rate control in may illuminate some shortness breath that she has climbing stairs.    Vesta Mixer, Montez Hageman., MD, [facility] 02/18/2012, 2:12 PM Office - [phone] Pager [phone]

## 2012-02-18 NOTE — Anesthesia Postprocedure Evaluation (Signed)
  Anesthesia Post-op Note  Patient: [name] [name]  Procedure(s) Performed: Procedure(s) (LRB) with comments: CARDIOVERSION (N/A)  Patient Location: PACU  Anesthesia Type:MAC  Level of Consciousness: awake, alert , oriented and patient cooperative  Airway and Oxygen Therapy: Patient Spontanous Breathing and Patient connected to nasal cannula oxygen  Post-op Pain: none  Post-op Assessment: Post-op Vital signs reviewed, Patient's Cardiovascular Status Stable, Respiratory Function Stable, Patent Airway, No signs of Nausea or vomiting and Pain level controlled  Post-op Vital Signs: Reviewed  Complications: No apparent anesthesia complications

## 2012-02-18 NOTE — Anesthesia Preprocedure Evaluation (Signed)
Anesthesia Evaluation    Airway Mallampati: II TM Distance: >3 FB Neck ROM: Full    Dental  (+) Dental Advisory Given   Pulmonary          Cardiovascular hypertension, Pt. on medications and Pt. on home beta blockers + dysrhythmias Atrial Fibrillation     Neuro/Psych    GI/Hepatic GERD-  Medicated and Controlled,  Endo/Other    Renal/GU      Musculoskeletal   Abdominal   Peds  Hematology   Anesthesia Other Findings   Reproductive/Obstetrics                           Anesthesia Physical Anesthesia Plan  ASA: II  Anesthesia Plan: MAC   Post-op Pain Management:    Induction: Intravenous  Airway Management Planned: Mask  Additional Equipment:   Intra-op Plan:   Post-operative Plan:   Informed Consent: I have reviewed the patients History and Physical, chart, labs and discussed the procedure including the risks, benefits and alternatives for the proposed anesthesia with the patient or authorized representative who has indicated his/her understanding and acceptance.   Dental advisory given  Plan Discussed with: CRNA, Anesthesiologist and Surgeon  Anesthesia Plan Comments:         Anesthesia Quick Evaluation

## 2012-02-19 ENCOUNTER — Telehealth: Payer: Self-pay | Admitting: Cardiovascular Disease

## 2012-02-19 ENCOUNTER — Encounter (HOSPITAL_COMMUNITY): Payer: Self-pay | Admitting: Cardiovascular Disease

## 2012-02-19 NOTE — Telephone Encounter (Signed)
New problem:      Cardioversion on yesterday . C/o heart out of rhythm.

## 2012-02-19 NOTE — Telephone Encounter (Signed)
She was recently seen by Dr. Johney Frame who had arranged the cardioversion.   We cardioverted her but she thinks that she is back in AF now.  His thoughts were that she may need tikosyn or perhaps amiodarone.   Any thoughts on this Fayrene Fearing.

## 2012-02-19 NOTE — Telephone Encounter (Signed)
Cardioversion 02/18/12, current HR 70 irreg/ pt feels she is in afib again, denies SOB, pt taking all meds as prescribed. She is going out of town tomorrow and won't be back till Monday afternoon, told her i would send Dr Elease Hashimoto a message, pt agreed to plan.

## 2012-02-23 NOTE — Telephone Encounter (Signed)
Will also forward to Dr Johney Frame and nurse.

## 2012-02-27 DIAGNOSIS — G20 Parkinson's disease: Secondary | ICD-10-CM | POA: Diagnosis not present

## 2012-02-29 NOTE — Telephone Encounter (Signed)
[name] [name],  Please call patient and offer her 3 day hospitalization for tikosyn vs initiation of amiodarone as an outpatient. She will need to stop flecainide but will likely need either tikosyn or amiodarone to allow for atrial remodeling post ablation.

## 2012-03-08 NOTE — Telephone Encounter (Signed)
I offered her to come in on Thurs for an EKG and we could see what her rhythm is.  Also discussed the 2 options Dr Johney Frame has recommended.  She is going out of town this week to visit her daughter and has a follow up apointment with Dr Johney Frame next week.  She is going to keep that to discuss options and call if she feels like she needs to come in sooner

## 2012-03-18 ENCOUNTER — Ambulatory Visit: Payer: Medicare Other | Admitting: Internal Medicine

## 2012-04-12 ENCOUNTER — Encounter: Payer: Self-pay | Admitting: Internal Medicine

## 2012-04-12 ENCOUNTER — Ambulatory Visit (INDEPENDENT_AMBULATORY_CARE_PROVIDER_SITE_OTHER): Payer: Medicare Other | Admitting: Internal Medicine

## 2012-04-12 VITALS — BP 132/93 | HR 76 | Ht 62.0 in | Wt 146.8 lb

## 2012-04-12 DIAGNOSIS — I10 Essential (primary) hypertension: Secondary | ICD-10-CM

## 2012-04-12 DIAGNOSIS — I4891 Unspecified atrial fibrillation: Secondary | ICD-10-CM | POA: Diagnosis not present

## 2012-04-12 NOTE — Assessment & Plan Note (Signed)
Persistent atrial fibrillation with symptoms.  She is s/p ablation but continues to have persistence of her afib.  She has failed medical therapy with flecainide. Therapeutic strategies for afib including medicine (tikosyn), repeat endocardial ablation, and the convergent procedure were discussed in detail with the patient today. At this time, she would like to be considered for the convergent procedure.  I will therefore refer her to Dr Smith Robert at [facility] for this discussion.I will stop flecainide today.

## 2012-04-12 NOTE — Patient Instructions (Addendum)
You have been referred to Dr Smith Robert [facility] [phone]   Your physician has recommended you make the following change in your medication:  1) Stop Flecainide  Your physician recommends that you schedule a follow-up appointment in: 3 months with Dr Johney Frame

## 2012-04-12 NOTE — Progress Notes (Signed)
PCP:  Ezequiel Kayser, MD Primary Cardiologist:  Dr Elease Hashimoto  The patient presents today for routine electrophysiology followup.  Since her afib ablation, the patient reports doing very well. She continues to have frequent afib.  She was cardioverted in January but unfortunately returned to afib within 2 days.   She reports palpitations and decreased exercise tolerance with her afib.  Today, she denies symptoms of exertional chest pain, shortness of breath, orthopnea, PND, lower extremity edema, dizziness, presyncope, syncope, or neurologic sequela.  The patient feels that she is tolerating medications without difficulties and is otherwise without complaint today.   Past Medical History  Diagnosis Date  . Hypertension   . Hyperlipidemia     a. Hx of leg weakness while on statin.  . Leg fracture Dec 21,2011  . Hip fracture 11-08-2008  . Chronic anticoagulation     on Xarelto  . High risk medication use     on Flecainide  . GERD (gastroesophageal reflux disease)   . Parkinson's disease   . Arthritis     lt thumb  . Osteoporosis 07/2007  . PAF (paroxysmal atrial fibrillation)     s/p atrial fib ablation 11-11-11.  restarted on Toprol & Flecainide   Past Surgical History  Procedure Laterality Date  . Knee surgery      2  . US echocardiography  03-10-2008    Est EF 55-60%  . Cardiovascular stress test  06-06-2008    EF 73%  . Wrist surgery    . Cardioversion  09/09/2011    Procedure: CARDIOVERSION;  Surgeon: Cassell Clement, MD;  Location: [facility] ENDOSCOPY;  Service: Cardiovascular;  Laterality: N/A;  to be done by dr. Patty Sermons  . Tee without cardioversion  11/10/2011    Procedure: TRANSESOPHAGEAL ECHOCARDIOGRAM (TEE);  Surgeon: Vesta Mixer, MD;  Location: [facility] ENDOSCOPY;  Service: Cardiovascular;  Laterality: N/A;  . Cardioversion  02/18/2012    Procedure: CARDIOVERSION;  Surgeon: Vesta Mixer, MD;  Location: [facility] ENDOSCOPY;  Service: Cardiovascular;  Laterality: N/A;  . Cholecystectomy  1995   . Tonsillectomy    . Breast surgery Left     benign cyst removed    Current Outpatient Prescriptions  Medication Sig Dispense Refill  . B Complex Vitamins (B COMPLEX PO) Take 1 tablet by mouth daily.       [name] CALCIUM PO Take 1 tablet by mouth daily.       . Calcium Polycarbophil (FIBERCON PO) Take 1 tablet by mouth daily.       . Coenzyme Q10 (CO Q 10 PO) Take 1 tablet by mouth daily.       Tery Sanfilippo Sodium (COLACE PO) Take 1 capsule by mouth 2 (two) times daily.       [name] ezetimibe (ZETIA) 10 MG tablet Take 10 mg by mouth every other day. Taking the same day as the crestor      . losartan (COZAAR) 25 MG tablet Take 25 mg by mouth daily.        . LUTEIN PO Take 1 tablet by mouth daily.      [name] MAGNESIUM PO Take 1 tablet by mouth daily.       . Multiple Vitamin (MULTIVITAMIN PO) Take 1 tablet by mouth daily.       . Omega-3 Fatty Acids (FISH OIL PO) Take 1 capsule by mouth daily.       [name] omeprazole (PRILOSEC) 20 MG capsule Take 20 mg by mouth as needed.      . rasagiline (  AZILECT) 1 MG TABS Take 1 mg by mouth daily.      . Rivaroxaban (XARELTO) 20 MG TABS Take 20 mg by mouth daily.      . rosuvastatin (CRESTOR) 5 MG tablet Take 2.5 mg by mouth every other day.      . trihexyphenidyl (ARTANE) 2 MG tablet Take 1 mg by mouth 2 (two) times daily with a meal.      . valACYclovir (VALTREX) 500 MG tablet Take 500 mg by mouth daily as needed.      . Vitamin D, Ergocalciferol, (DRISDOL) 50000 UNITS CAPS Take 50,000 Units by mouth every 7 (seven) days.        No current facility-administered medications for this visit.    Allergies  Allergen Reactions  . Statins     Leg weakness - but tolerating low dose Crestor every other day    History   Social History  . Marital Status: Married    Spouse Name: N/A    Number of Children: N/A  . Years of Education: N/A   Occupational History  . Not on file.   Social History Main Topics  . Smoking status: Never Smoker   . Smokeless tobacco: Not  on file  . Alcohol Use: 4.8 oz/week    8 Glasses of wine per week     Comment: wine x2 per day  . Drug Use: No  . Sexually Active: Not Currently   Other Topics Concern  . Not on file   Social History Narrative   Lives in [place].  Retired Comptroller.    Family History  Problem Relation Age of Onset  . Alzheimer's disease Mother     ROS-  All systems are reviewed and are negative except as outlined in the HPI above   Physical Exam: Filed Vitals:   04/12/12 0951  BP: 132/93  Pulse: 76  Height: 5\' 2"  (1.575 m)  Weight: 146 lb 12.8 oz (66.588 kg)    GEN- The patient is well appearing, alert and oriented x 3 today.   Head- normocephalic, atraumatic Eyes-  Sclera clear, conjunctiva pink Ears- hearing intact Oropharynx- clear Neck- supple, no JVP Lymph- no cervical lymphadenopathy Lungs- Clear to ausculation bilaterally, normal work of breathing Heart- irregular rate and rhythm, no murmurs, rubs or gallops, PMI not laterally displaced GI- soft, NT, ND, + BS Extremities- no clubbing, cyanosis, or edema MS- no significant deformity or atrophy Skin- no rash or lesion Psych- euthymic mood, full affect Neuro- strength and sensation are intact  ekg today reveals afib, V rate 73 bpm, poor R wave progression  Assessment and Plan:

## 2012-04-12 NOTE — Assessment & Plan Note (Signed)
Stable No change required today  

## 2012-04-13 ENCOUNTER — Ambulatory Visit: Payer: Medicare Other | Admitting: Cardiovascular Disease

## 2012-04-19 DIAGNOSIS — I4891 Unspecified atrial fibrillation: Secondary | ICD-10-CM | POA: Diagnosis not present

## 2012-04-19 DIAGNOSIS — I119 Hypertensive heart disease without heart failure: Secondary | ICD-10-CM | POA: Diagnosis not present

## 2012-04-22 DIAGNOSIS — L84 Corns and callosities: Secondary | ICD-10-CM | POA: Diagnosis not present

## 2012-04-22 DIAGNOSIS — M898X9 Other specified disorders of bone, unspecified site: Secondary | ICD-10-CM | POA: Diagnosis not present

## 2012-04-22 DIAGNOSIS — M779 Enthesopathy, unspecified: Secondary | ICD-10-CM | POA: Diagnosis not present

## 2012-04-22 DIAGNOSIS — M2040 Other hammer toe(s) (acquired), unspecified foot: Secondary | ICD-10-CM | POA: Diagnosis not present

## 2012-05-06 ENCOUNTER — Telehealth: Payer: Self-pay | Admitting: Internal Medicine

## 2012-05-06 DIAGNOSIS — I4891 Unspecified atrial fibrillation: Secondary | ICD-10-CM

## 2012-05-06 DIAGNOSIS — Z79899 Other long term (current) drug therapy: Secondary | ICD-10-CM | POA: Diagnosis not present

## 2012-05-06 NOTE — Telephone Encounter (Signed)
New Prob    Was referred to Dr. Hurman Horn. Requesting cardiac cath before she has hybrid AF procedure done  at [facility]. Would like to speak to nurse.

## 2012-05-07 NOTE — Telephone Encounter (Signed)
lmom for Tresa Endo at [facility] in regards to setting up cath for patient

## 2012-05-09 ENCOUNTER — Other Ambulatory Visit: Payer: Self-pay | Admitting: Nurse Practitioner

## 2012-05-10 ENCOUNTER — Ambulatory Visit (INDEPENDENT_AMBULATORY_CARE_PROVIDER_SITE_OTHER): Payer: Medicare Other | Admitting: Obstetrics and Gynecology

## 2012-05-10 ENCOUNTER — Encounter: Payer: Self-pay | Admitting: Obstetrics and Gynecology

## 2012-05-10 VITALS — BP 122/80 | Ht 61.5 in | Wt 148.0 lb

## 2012-05-10 DIAGNOSIS — Z01419 Encounter for gynecological examination (general) (routine) without abnormal findings: Secondary | ICD-10-CM | POA: Diagnosis not present

## 2012-05-10 DIAGNOSIS — Z124 Encounter for screening for malignant neoplasm of cervix: Secondary | ICD-10-CM | POA: Diagnosis not present

## 2012-05-10 NOTE — Patient Instructions (Addendum)

## 2012-05-10 NOTE — Progress Notes (Signed)
72 y.o.  Married  Caucasian female   No obstetric history on file. here for annual exam.  Dr. Waynard Edwards checks her BMD, Presumably femoral fractures were related to fosamax, so pt not a candidate for bisphos.  BMD currently stable, so Dr. Waynard Edwards has not started tx, and elects to wait to see if her BMD gets worse.  Going to [facility] May 8th to have a new surgical tx for her a-fib.    Patient's last menstrual period was 02/04/1996.          Sexually active: yes  The current method of family planning is post menopausal status.    Exercising: walking,weight lifting, yoga Last mammogram:12/04/11 normal per patient History of abnormal pap: none Smoking:no Alcohol:2 glasses of wine a day Last colonoscopy:06/04/2007 normal,  every 5years  Pt has called for an appt. Last Bone Density:  01/04/2012, osteoporosis managed by Dr. Waynard Edwards Last tetanus shot: 2008 - the same year she broke her wrist Last cholesterol check: 07/05/2011 normal per patient  Hgb:    pcp            Urine:pcp    Health Maintenance  Topic Date Due  . Tetanus/tdap  07/11/1961  . Colonoscopy  07/11/1992  . Zostavax  07/12/2002  . Pneumococcal Polysaccharide Vaccine Age 97 And Over  07/12/2007  . Influenza Vaccine  10/04/2012  . Mammogram  12/21/2013    Family History  Problem Relation Age of Onset  . Alzheimer's disease Mother   . Heart failure Father     Patient Active Problem List  Diagnosis  . A-fib  . Chest pain  . Hypertension    Past Medical History  Diagnosis Date  . Hypertension   . Hyperlipidemia     a. Hx of leg weakness while on statin.  . Leg fracture Dec 21,2011  . Hip fracture 11-08-2008  . Chronic anticoagulation     on Xarelto  . High risk medication use     on Flecainide  . GERD (gastroesophageal reflux disease)   . Parkinson's disease   . Arthritis     lt thumb  . Osteoporosis 07/2007  . PAF (paroxysmal atrial fibrillation)     s/p atrial fib ablation 11-11-11.  restarted on Toprol &  Flecainide  . Atrial fibrillation     Past Surgical History  Procedure Laterality Date  . Knee surgery      2  . US echocardiography  03-10-2008    Est EF 55-60%  . Cardiovascular stress test  06-06-2008    EF 73%  . Wrist surgery    . Cardioversion  09/09/2011    Procedure: CARDIOVERSION;  Surgeon: Cassell Clement, MD;  Location: [facility] ENDOSCOPY;  Service: Cardiovascular;  Laterality: N/A;  to be done by dr. Patty Sermons  . Tee without cardioversion  11/10/2011    Procedure: TRANSESOPHAGEAL ECHOCARDIOGRAM (TEE);  Surgeon: Vesta Mixer, MD;  Location: [facility] ENDOSCOPY;  Service: Cardiovascular;  Laterality: N/A;  . Cardioversion  02/18/2012    Procedure: CARDIOVERSION;  Surgeon: Vesta Mixer, MD;  Location: [facility] ENDOSCOPY;  Service: Cardiovascular;  Laterality: N/A;  . Cholecystectomy  1995  . Tonsillectomy    . Breast surgery Left     benign cyst removed    Allergies: Statins  Current Outpatient Prescriptions  Medication Sig Dispense Refill  . B Complex Vitamins (B COMPLEX PO) Take 1 tablet by mouth daily.       [name] CALCIUM PO Take 1 tablet by mouth daily.       [name]  Calcium Polycarbophil (FIBERCON PO) Take 1 tablet by mouth daily.       . Coenzyme Q10 (CO Q 10 PO) Take 1 tablet by mouth daily.       Tery Sanfilippo Sodium (COLACE PO) Take 1 capsule by mouth 2 (two) times daily.       [name] ezetimibe (ZETIA) 10 MG tablet Take 10 mg by mouth every other day. Taking the same day as the crestor      . losartan (COZAAR) 25 MG tablet Take 25 mg by mouth daily.        . LUTEIN PO Take 1 tablet by mouth daily.      [name] MAGNESIUM PO Take 1 tablet by mouth daily.       . Multiple Vitamin (MULTIVITAMIN PO) Take 1 tablet by mouth daily.       . Omega-3 Fatty Acids (FISH OIL PO) Take 1 capsule by mouth daily.       [name] omeprazole (PRILOSEC) 20 MG capsule Take 20 mg by mouth as needed.      . rasagiline (AZILECT) 1 MG TABS Take 1 mg by mouth daily.      . Rivaroxaban (XARELTO) 20 MG TABS Take 20 mg by mouth daily.       . rosuvastatin (CRESTOR) 5 MG tablet Take 2.5 mg by mouth every other day.      . trihexyphenidyl (ARTANE) 2 MG tablet Take 1 mg by mouth 2 (two) times daily with a meal.      . valACYclovir (VALTREX) 500 MG tablet Take 500 mg by mouth daily as needed.      . Vitamin D, Ergocalciferol, (DRISDOL) 50000 UNITS CAPS Take 50,000 Units by mouth every 7 (seven) days.       Carlena Hurl 20 MG TABS TAKE 1 TABLET (20 MG TOTAL) BY MOUTH DAILY.  30 tablet  3   No current facility-administered medications for this visit.    ROS: Pertinent items are noted in HPI.  Exam:    BP 122/80  Ht 5' 1.5" (1.562 m)  Wt 148 lb (67.132 kg)  BMI 27.51 kg/m2  LMP 02/04/1996   Wt Readings from Last 3 Encounters:  05/10/12 148 lb (67.132 kg)  04/12/12 146 lb 12.8 oz (66.588 kg)  02/18/12 149 lb (67.586 kg)     Ht Readings from Last 3 Encounters:  05/10/12 5' 1.5" (1.562 m)  04/12/12 5\' 2"  (1.575 m)  02/18/12 5\' 2"  (1.575 m)    General appearance: alert, cooperative and appears stated age Head: Normocephalic, without obvious abnormality, atraumatic Neck: no adenopathy, supple, symmetrical, trachea midline and thyroid not enlarged, symmetric, no tenderness/mass/nodules Lungs: clear to auscultation bilaterally Breasts: Inspection negative, No nipple retraction or dimpling, No nipple discharge or bleeding, No axillary or supraclavicular adenopathy, Normal to palpation without dominant masses Heart: regular rate and rhythm -no evidence of a fib at this exam Abdomen: soft, non-tender; bowel sounds normal; no masses,  no organomegaly Extremities: extremities normal, atraumatic, no cyanosis or edema Skin: Skin color, texture, turgor normal. No rashes or lesions Lymph nodes: Cervical, supraclavicular, and axillary nodes normal. No abnormal inguinal nodes palpated Neurologic: Grossly normal   Pelvic: External genitalia:  no lesions              Urethra:  normal appearing urethra with no masses, tenderness or  lesions              Bartholins and Skenes: normal  Vagina: normal appearing vagina with normal color and discharge, no lesions              Cervix: normal appearance              Pap taken: no        Bimanual Exam:  Uterus:  uterus is normal size, shape, consistency and nontender, AF, NT                                      Adnexa: normal adnexa in size, nontender and no masses                                      Rectovaginal: Confirms                                      Anus:  normal sphincter tone, no lesions  A: normal menopausal exam     Osteoporosis with bilat femoral stress fx     Parkinson's Dz - fall risk     A-fib, elevated chol, s/p cardioversion     P: mammogram counseled on breast self exam, mammography screening, osteoporosis, adequate intake of calcium and vitamin D, diet and exercise return annually or prn     An After Visit Summary was printed and given to the patient.

## 2012-05-14 NOTE — Telephone Encounter (Signed)
lmom for patient to call me back to set up JV cath prior to afib ablation at [facility].  She will need a PA work up

## 2012-05-17 ENCOUNTER — Encounter: Payer: Self-pay | Admitting: *Deleted

## 2012-05-17 NOTE — Telephone Encounter (Signed)
Follow-up: ° ° ° °Patient called returning your call. Please call back. °

## 2012-05-17 NOTE — Telephone Encounter (Signed)
Going for JV cath on 05/25/12

## 2012-05-18 ENCOUNTER — Other Ambulatory Visit (INDEPENDENT_AMBULATORY_CARE_PROVIDER_SITE_OTHER): Payer: Medicare Other

## 2012-05-18 ENCOUNTER — Other Ambulatory Visit: Payer: Self-pay | Admitting: *Deleted

## 2012-05-18 DIAGNOSIS — I4891 Unspecified atrial fibrillation: Secondary | ICD-10-CM | POA: Diagnosis not present

## 2012-05-18 LAB — CBC WITH DIFFERENTIAL/PLATELET
Basophils Relative: 0.6 % (ref 0.0–3.0)
Eosinophils Relative: 1.6 % (ref 0.0–5.0)
HCT: 37.3 % (ref 36.0–46.0)
Lymphs Abs: 1.3 10*3/uL (ref 0.7–4.0)
MCV: 91.7 fl (ref 78.0–100.0)
Monocytes Absolute: 0.5 10*3/uL (ref 0.1–1.0)
Monocytes Relative: 9.6 % (ref 3.0–12.0)
Neutrophils Relative %: 61.6 % (ref 43.0–77.0)
RBC: 4.07 Mil/uL (ref 3.87–5.11)
WBC: 4.9 10*3/uL (ref 4.5–10.5)

## 2012-05-18 LAB — BASIC METABOLIC PANEL
Chloride: 100 mEq/L (ref 96–112)
GFR: 90.96 mL/min (ref >60.00)
Potassium: 4.1 mEq/L (ref 3.5–5.1)

## 2012-05-24 ENCOUNTER — Telehealth: Payer: Self-pay | Admitting: Internal Medicine

## 2012-05-24 NOTE — Telephone Encounter (Signed)
New Prob    Pt has some questions regarding procedure tomorrow. Please call.

## 2012-05-24 NOTE — Telephone Encounter (Signed)
Spoke with Tresa Endo nurse at [facility] pt will hold Xarelto tonight and proceed with cath tomorrow   Patient aware

## 2012-05-25 ENCOUNTER — Encounter (HOSPITAL_BASED_OUTPATIENT_CLINIC_OR_DEPARTMENT_OTHER): Payer: Self-pay

## 2012-05-25 ENCOUNTER — Inpatient Hospital Stay (HOSPITAL_BASED_OUTPATIENT_CLINIC_OR_DEPARTMENT_OTHER)
Admission: RE | Admit: 2012-05-25 | Discharge: 2012-05-25 | Disposition: A | Discharge status: Home / Self Care | Payer: Medicare Other | Source: Ambulatory Visit | Attending: Cardiology | Admitting: Cardiology

## 2012-05-25 ENCOUNTER — Encounter (HOSPITAL_BASED_OUTPATIENT_CLINIC_OR_DEPARTMENT_OTHER)
Admission: RE | Disposition: A | Discharge status: Home / Self Care | Payer: Self-pay | Source: Ambulatory Visit | Attending: Cardiology

## 2012-05-25 DIAGNOSIS — I4891 Unspecified atrial fibrillation: Secondary | ICD-10-CM

## 2012-05-25 SURGERY — JV LEFT HEART CATHETERIZATION WITH CORONARY ANGIOGRAM
Anesthesia: Moderate Sedation

## 2012-05-25 MED ORDER — SODIUM CHLORIDE 0.9 % IJ SOLN: 3.0000 mL | INTRAMUSCULAR | Status: DC | PRN

## 2012-05-25 MED ORDER — SODIUM CHLORIDE 0.9 % IJ SOLN: 3.0000 mL | Freq: Two times a day (BID) | INTRAMUSCULAR | Status: DC

## 2012-05-25 MED ORDER — ASPIRIN 81 MG PO CHEW
324.0000 mg | CHEWABLE_TABLET | ORAL | Status: AC
  Administered 2012-05-25: 324 mg via ORAL

## 2012-05-25 MED ORDER — SODIUM CHLORIDE 0.9 % IV SOLN: 250.0000 mL | INTRAVENOUS | Status: DC | PRN

## 2012-05-25 NOTE — OR Nursing (Signed)
Discharge instructions reviewed and signed, pt stated understanding, ambulated in hall without difficulty, site level 0, transported to husband's car via wheelchair 

## 2012-05-25 NOTE — CV Procedure (Signed)
   Cardiac Catheterization Procedure Note  Name: Prisca Gearing MRN: [id number] DOB: 1940/10/02  Procedure: Left Heart Cath, Selective Coronary Angiography, LV angiography  Indication: Pre-convergent procedure for treatment of atrial fibrillation   Procedural Details: The right wrist was prepped, draped, and anesthetized with 1% lidocaine. Using the modified Seldinger technique, a 5 French sheath was introduced into the right radial artery. 3 mg of verapamil was administered through the sheath, weight-based unfractionated heparin was administered intravenously. Standard Judkins catheters were used for selective coronary angiography and left ventriculography. Catheter exchanges were performed over an exchange length guidewire. There were no immediate procedural complications. A TR band was used for radial hemostasis at the completion of the procedure.  The patient was transferred to the post catheterization recovery area for further monitoring.  Procedural Findings: Hemodynamics: AO 129/68 LV 132/15  Coronary angiography: Coronary dominance: left  Left mainstem: Short, no CAD.  Left anterior descending (LAD): No angiographic CAD.  Left circumflex (LCx): Large dominant vessel with no angiographic CAD.  Right coronary artery (RCA): Small, nondominant vessel with no angiographic CAD.   Left ventriculography: Left ventricular systolic function is normal, LVEF is estimated at 55-60%, there is no significant mitral regurgitation   Final Conclusions:  Normal LV systolic function, no angiographic CAD.   Marca Ancona 05/25/2012, 9:29 AM

## 2012-05-25 NOTE — OR Nursing (Signed)
+  Allen's test right hand 

## 2012-05-25 NOTE — H&P (Signed)
72 yo with history of atrial fibrillation presents today for elective LHC.  She will be undergoing convergent procedure at [facility] for her atrial fibrillation and cardiac catheterization was requested pre-procedure.  Vitals are stable today.  Recent labs are acceptable.  No change to exam from Dr. Jenel Lucks last note on 3/10.  Will proceed with procedure this morning.   [name] [name] 05/25/2012 9:06 AM

## 2012-06-01 ENCOUNTER — Telehealth: Payer: Self-pay | Admitting: Internal Medicine

## 2012-06-01 NOTE — Telephone Encounter (Signed)
Cath report sent to [facility]  Patient was to have CD with her

## 2012-06-01 NOTE — Telephone Encounter (Signed)
New problem   Want to discuss pt cardiac cath that was done 05/25/12. Please call [facility]

## 2012-06-09 DIAGNOSIS — G20A1 Parkinson's disease without dyskinesia, without mention of fluctuations: Secondary | ICD-10-CM | POA: Diagnosis not present

## 2012-06-09 DIAGNOSIS — Z01810 Encounter for preprocedural cardiovascular examination: Secondary | ICD-10-CM | POA: Diagnosis not present

## 2012-06-09 DIAGNOSIS — G20 Parkinson's disease: Secondary | ICD-10-CM | POA: Insufficient documentation

## 2012-06-09 DIAGNOSIS — E876 Hypokalemia: Secondary | ICD-10-CM | POA: Diagnosis not present

## 2012-06-09 DIAGNOSIS — E8779 Other fluid overload: Secondary | ICD-10-CM | POA: Diagnosis not present

## 2012-06-09 DIAGNOSIS — J9819 Other pulmonary collapse: Secondary | ICD-10-CM | POA: Diagnosis not present

## 2012-06-09 DIAGNOSIS — D62 Acute posthemorrhagic anemia: Secondary | ICD-10-CM | POA: Diagnosis not present

## 2012-06-09 DIAGNOSIS — K219 Gastro-esophageal reflux disease without esophagitis: Secondary | ICD-10-CM | POA: Diagnosis not present

## 2012-06-09 DIAGNOSIS — E785 Hyperlipidemia, unspecified: Secondary | ICD-10-CM | POA: Insufficient documentation

## 2012-06-09 DIAGNOSIS — I4891 Unspecified atrial fibrillation: Secondary | ICD-10-CM | POA: Diagnosis not present

## 2012-06-09 DIAGNOSIS — I517 Cardiomegaly: Secondary | ICD-10-CM | POA: Diagnosis not present

## 2012-06-09 DIAGNOSIS — I10 Essential (primary) hypertension: Secondary | ICD-10-CM | POA: Diagnosis not present

## 2012-06-09 DIAGNOSIS — R7989 Other specified abnormal findings of blood chemistry: Secondary | ICD-10-CM | POA: Diagnosis not present

## 2012-06-09 DIAGNOSIS — Z7901 Long term (current) use of anticoagulants: Secondary | ICD-10-CM | POA: Diagnosis not present

## 2012-06-09 DIAGNOSIS — K769 Liver disease, unspecified: Secondary | ICD-10-CM | POA: Diagnosis present

## 2012-06-09 DIAGNOSIS — M810 Age-related osteoporosis without current pathological fracture: Secondary | ICD-10-CM | POA: Insufficient documentation

## 2012-06-09 DIAGNOSIS — Z79899 Other long term (current) drug therapy: Secondary | ICD-10-CM | POA: Diagnosis not present

## 2012-06-09 DIAGNOSIS — R5383 Other fatigue: Secondary | ICD-10-CM | POA: Diagnosis not present

## 2012-06-09 DIAGNOSIS — J984 Other disorders of lung: Secondary | ICD-10-CM | POA: Diagnosis not present

## 2012-06-09 DIAGNOSIS — Z01818 Encounter for other preprocedural examination: Secondary | ICD-10-CM | POA: Diagnosis not present

## 2012-06-09 DIAGNOSIS — J9600 Acute respiratory failure, unspecified whether with hypoxia or hypercapnia: Secondary | ICD-10-CM | POA: Diagnosis not present

## 2012-06-09 DIAGNOSIS — I4581 Long QT syndrome: Secondary | ICD-10-CM | POA: Diagnosis not present

## 2012-06-09 DIAGNOSIS — J90 Pleural effusion, not elsewhere classified: Secondary | ICD-10-CM | POA: Diagnosis not present

## 2012-06-09 DIAGNOSIS — E871 Hypo-osmolality and hyponatremia: Secondary | ICD-10-CM | POA: Diagnosis not present

## 2012-06-09 DIAGNOSIS — I2510 Atherosclerotic heart disease of native coronary artery without angina pectoris: Secondary | ICD-10-CM | POA: Diagnosis not present

## 2012-06-09 DIAGNOSIS — Z8249 Family history of ischemic heart disease and other diseases of the circulatory system: Secondary | ICD-10-CM | POA: Diagnosis not present

## 2012-06-09 DIAGNOSIS — J9383 Other pneumothorax: Secondary | ICD-10-CM | POA: Diagnosis not present

## 2012-06-09 DIAGNOSIS — I059 Rheumatic mitral valve disease, unspecified: Secondary | ICD-10-CM | POA: Diagnosis not present

## 2012-06-09 DIAGNOSIS — I499 Cardiac arrhythmia, unspecified: Secondary | ICD-10-CM | POA: Diagnosis not present

## 2012-06-16 ENCOUNTER — Telehealth: Payer: Self-pay | Admitting: Internal Medicine

## 2012-06-16 NOTE — Telephone Encounter (Signed)
New problem    Please advise on tikoysn where she can get this medication.

## 2012-06-16 NOTE — Telephone Encounter (Signed)
lmom that she can try [facility] or [facility].  If they don't carry please call me back

## 2012-06-17 DIAGNOSIS — L821 Other seborrheic keratosis: Secondary | ICD-10-CM | POA: Diagnosis not present

## 2012-06-17 DIAGNOSIS — L570 Actinic keratosis: Secondary | ICD-10-CM | POA: Diagnosis not present

## 2012-06-17 DIAGNOSIS — B009 Herpesviral infection, unspecified: Secondary | ICD-10-CM | POA: Diagnosis not present

## 2012-06-20 ENCOUNTER — Other Ambulatory Visit: Payer: Self-pay

## 2012-06-20 MED ORDER — RASAGILINE MESYLATE 1 MG PO TABS: 1.0000 mg | ORAL_TABLET | Freq: Every day | ORAL | Status: DC

## 2012-06-21 ENCOUNTER — Encounter: Payer: Self-pay | Admitting: Neurology

## 2012-06-21 ENCOUNTER — Ambulatory Visit: Payer: Medicare Other | Admitting: Internal Medicine

## 2012-06-21 DIAGNOSIS — E871 Hypo-osmolality and hyponatremia: Secondary | ICD-10-CM | POA: Diagnosis not present

## 2012-06-22 DIAGNOSIS — H40019 Open angle with borderline findings, low risk, unspecified eye: Secondary | ICD-10-CM | POA: Diagnosis not present

## 2012-06-24 DIAGNOSIS — Z09 Encounter for follow-up examination after completed treatment for conditions other than malignant neoplasm: Secondary | ICD-10-CM | POA: Diagnosis not present

## 2012-06-24 DIAGNOSIS — R609 Edema, unspecified: Secondary | ICD-10-CM | POA: Diagnosis not present

## 2012-06-24 DIAGNOSIS — I499 Cardiac arrhythmia, unspecified: Secondary | ICD-10-CM | POA: Diagnosis not present

## 2012-06-24 DIAGNOSIS — R5381 Other malaise: Secondary | ICD-10-CM | POA: Diagnosis not present

## 2012-06-24 DIAGNOSIS — R0609 Other forms of dyspnea: Secondary | ICD-10-CM | POA: Diagnosis not present

## 2012-06-24 DIAGNOSIS — I4891 Unspecified atrial fibrillation: Secondary | ICD-10-CM | POA: Diagnosis not present

## 2012-06-24 DIAGNOSIS — J90 Pleural effusion, not elsewhere classified: Secondary | ICD-10-CM | POA: Diagnosis not present

## 2012-06-24 DIAGNOSIS — J9819 Other pulmonary collapse: Secondary | ICD-10-CM | POA: Diagnosis not present

## 2012-07-13 DIAGNOSIS — E871 Hypo-osmolality and hyponatremia: Secondary | ICD-10-CM | POA: Diagnosis not present

## 2012-07-27 DIAGNOSIS — E871 Hypo-osmolality and hyponatremia: Secondary | ICD-10-CM | POA: Diagnosis not present

## 2012-07-28 DIAGNOSIS — R141 Gas pain: Secondary | ICD-10-CM | POA: Diagnosis not present

## 2012-07-28 DIAGNOSIS — Z86010 Personal history of colonic polyps: Secondary | ICD-10-CM | POA: Diagnosis not present

## 2012-07-28 DIAGNOSIS — R142 Eructation: Secondary | ICD-10-CM | POA: Diagnosis not present

## 2012-08-23 DIAGNOSIS — Z79899 Other long term (current) drug therapy: Secondary | ICD-10-CM | POA: Diagnosis not present

## 2012-08-23 DIAGNOSIS — I4891 Unspecified atrial fibrillation: Secondary | ICD-10-CM | POA: Diagnosis not present

## 2012-08-23 DIAGNOSIS — I10 Essential (primary) hypertension: Secondary | ICD-10-CM | POA: Diagnosis not present

## 2012-08-23 DIAGNOSIS — G20 Parkinson's disease: Secondary | ICD-10-CM | POA: Diagnosis not present

## 2012-08-23 DIAGNOSIS — Z0183 Encounter for blood typing: Secondary | ICD-10-CM | POA: Diagnosis not present

## 2012-08-23 DIAGNOSIS — I059 Rheumatic mitral valve disease, unspecified: Secondary | ICD-10-CM | POA: Diagnosis not present

## 2012-08-23 DIAGNOSIS — I517 Cardiomegaly: Secondary | ICD-10-CM | POA: Diagnosis not present

## 2012-08-23 DIAGNOSIS — E785 Hyperlipidemia, unspecified: Secondary | ICD-10-CM | POA: Diagnosis not present

## 2012-08-24 DIAGNOSIS — M810 Age-related osteoporosis without current pathological fracture: Secondary | ICD-10-CM | POA: Diagnosis not present

## 2012-08-24 DIAGNOSIS — Z79899 Other long term (current) drug therapy: Secondary | ICD-10-CM | POA: Diagnosis not present

## 2012-08-24 DIAGNOSIS — I4892 Unspecified atrial flutter: Secondary | ICD-10-CM | POA: Diagnosis not present

## 2012-08-24 DIAGNOSIS — I4891 Unspecified atrial fibrillation: Secondary | ICD-10-CM | POA: Diagnosis not present

## 2012-08-24 DIAGNOSIS — K219 Gastro-esophageal reflux disease without esophagitis: Secondary | ICD-10-CM | POA: Diagnosis not present

## 2012-08-24 DIAGNOSIS — I10 Essential (primary) hypertension: Secondary | ICD-10-CM | POA: Diagnosis not present

## 2012-08-24 DIAGNOSIS — E785 Hyperlipidemia, unspecified: Secondary | ICD-10-CM | POA: Diagnosis not present

## 2012-08-24 DIAGNOSIS — I440 Atrioventricular block, first degree: Secondary | ICD-10-CM | POA: Diagnosis not present

## 2012-08-24 DIAGNOSIS — G20 Parkinson's disease: Secondary | ICD-10-CM | POA: Diagnosis not present

## 2012-08-24 DIAGNOSIS — Z5181 Encounter for therapeutic drug level monitoring: Secondary | ICD-10-CM | POA: Diagnosis not present

## 2012-08-25 DIAGNOSIS — I4891 Unspecified atrial fibrillation: Secondary | ICD-10-CM | POA: Diagnosis not present

## 2012-08-25 DIAGNOSIS — K219 Gastro-esophageal reflux disease without esophagitis: Secondary | ICD-10-CM | POA: Diagnosis not present

## 2012-08-25 DIAGNOSIS — E785 Hyperlipidemia, unspecified: Secondary | ICD-10-CM | POA: Diagnosis not present

## 2012-08-25 DIAGNOSIS — G20 Parkinson's disease: Secondary | ICD-10-CM | POA: Diagnosis not present

## 2012-08-25 DIAGNOSIS — I4892 Unspecified atrial flutter: Secondary | ICD-10-CM | POA: Diagnosis not present

## 2012-08-25 DIAGNOSIS — I10 Essential (primary) hypertension: Secondary | ICD-10-CM | POA: Diagnosis not present

## 2012-08-29 ENCOUNTER — Other Ambulatory Visit: Payer: Self-pay

## 2012-08-29 MED ORDER — TRIHEXYPHENIDYL HCL 2 MG PO TABS: 1.0000 mg | ORAL_TABLET | Freq: Two times a day (BID) | ORAL | Status: DC

## 2012-08-29 NOTE — Telephone Encounter (Signed)
Former Love patient assigned to Dr Athar.  

## 2012-09-15 ENCOUNTER — Other Ambulatory Visit: Payer: Self-pay | Admitting: Nurse Practitioner

## 2012-10-05 DIAGNOSIS — Z029 Encounter for administrative examinations, unspecified: Secondary | ICD-10-CM | POA: Diagnosis not present

## 2012-10-06 ENCOUNTER — Encounter: Payer: Self-pay | Admitting: Internal Medicine

## 2012-10-06 DIAGNOSIS — M899 Disorder of bone, unspecified: Secondary | ICD-10-CM | POA: Diagnosis not present

## 2012-10-06 DIAGNOSIS — R809 Proteinuria, unspecified: Secondary | ICD-10-CM | POA: Diagnosis not present

## 2012-10-06 DIAGNOSIS — I10 Essential (primary) hypertension: Secondary | ICD-10-CM | POA: Diagnosis not present

## 2012-10-06 DIAGNOSIS — E785 Hyperlipidemia, unspecified: Secondary | ICD-10-CM | POA: Diagnosis not present

## 2012-10-11 ENCOUNTER — Ambulatory Visit: Payer: Medicare Other | Admitting: Neurology

## 2012-10-12 ENCOUNTER — Other Ambulatory Visit: Payer: Self-pay | Admitting: Gastroenterology

## 2012-10-12 DIAGNOSIS — Z86010 Personal history of colonic polyps: Secondary | ICD-10-CM | POA: Diagnosis not present

## 2012-10-12 DIAGNOSIS — D126 Benign neoplasm of colon, unspecified: Secondary | ICD-10-CM | POA: Diagnosis not present

## 2012-10-12 DIAGNOSIS — K5730 Diverticulosis of large intestine without perforation or abscess without bleeding: Secondary | ICD-10-CM | POA: Diagnosis not present

## 2012-10-12 DIAGNOSIS — Z09 Encounter for follow-up examination after completed treatment for conditions other than malignant neoplasm: Secondary | ICD-10-CM | POA: Diagnosis not present

## 2012-10-13 DIAGNOSIS — G4700 Insomnia, unspecified: Secondary | ICD-10-CM | POA: Diagnosis not present

## 2012-10-13 DIAGNOSIS — Z23 Encounter for immunization: Secondary | ICD-10-CM | POA: Diagnosis not present

## 2012-10-13 DIAGNOSIS — Z0000 Encounter for general adult medical examination without abnormal findings: Secondary | ICD-10-CM | POA: Diagnosis not present

## 2012-10-13 DIAGNOSIS — E785 Hyperlipidemia, unspecified: Secondary | ICD-10-CM | POA: Diagnosis not present

## 2012-10-13 DIAGNOSIS — G20 Parkinson's disease: Secondary | ICD-10-CM | POA: Diagnosis not present

## 2012-10-13 DIAGNOSIS — E079 Disorder of thyroid, unspecified: Secondary | ICD-10-CM | POA: Diagnosis not present

## 2012-10-13 DIAGNOSIS — Z1331 Encounter for screening for depression: Secondary | ICD-10-CM | POA: Diagnosis not present

## 2012-10-13 DIAGNOSIS — I4891 Unspecified atrial fibrillation: Secondary | ICD-10-CM | POA: Diagnosis not present

## 2012-10-13 DIAGNOSIS — I10 Essential (primary) hypertension: Secondary | ICD-10-CM | POA: Diagnosis not present

## 2012-10-13 DIAGNOSIS — M810 Age-related osteoporosis without current pathological fracture: Secondary | ICD-10-CM | POA: Diagnosis not present

## 2012-10-15 DIAGNOSIS — I4891 Unspecified atrial fibrillation: Secondary | ICD-10-CM | POA: Diagnosis not present

## 2012-10-15 DIAGNOSIS — D2370 Other benign neoplasm of skin of unspecified lower limb, including hip: Secondary | ICD-10-CM | POA: Diagnosis not present

## 2012-10-15 DIAGNOSIS — Z79899 Other long term (current) drug therapy: Secondary | ICD-10-CM | POA: Diagnosis not present

## 2012-10-15 DIAGNOSIS — D239 Other benign neoplasm of skin, unspecified: Secondary | ICD-10-CM | POA: Diagnosis not present

## 2012-10-15 DIAGNOSIS — T148 Other injury of unspecified body region: Secondary | ICD-10-CM | POA: Diagnosis not present

## 2012-10-15 DIAGNOSIS — L821 Other seborrheic keratosis: Secondary | ICD-10-CM | POA: Diagnosis not present

## 2012-10-15 DIAGNOSIS — L570 Actinic keratosis: Secondary | ICD-10-CM | POA: Diagnosis not present

## 2012-10-15 DIAGNOSIS — I498 Other specified cardiac arrhythmias: Secondary | ICD-10-CM | POA: Diagnosis not present

## 2012-10-20 DIAGNOSIS — K219 Gastro-esophageal reflux disease without esophagitis: Secondary | ICD-10-CM | POA: Diagnosis not present

## 2012-10-20 DIAGNOSIS — I495 Sick sinus syndrome: Secondary | ICD-10-CM | POA: Diagnosis not present

## 2012-10-20 DIAGNOSIS — I10 Essential (primary) hypertension: Secondary | ICD-10-CM | POA: Diagnosis not present

## 2012-10-20 DIAGNOSIS — I739 Peripheral vascular disease, unspecified: Secondary | ICD-10-CM | POA: Diagnosis not present

## 2012-10-20 DIAGNOSIS — J9383 Other pneumothorax: Secondary | ICD-10-CM | POA: Diagnosis not present

## 2012-10-20 DIAGNOSIS — M810 Age-related osteoporosis without current pathological fracture: Secondary | ICD-10-CM | POA: Diagnosis not present

## 2012-10-20 DIAGNOSIS — G20 Parkinson's disease: Secondary | ICD-10-CM | POA: Diagnosis not present

## 2012-10-20 DIAGNOSIS — I4891 Unspecified atrial fibrillation: Secondary | ICD-10-CM | POA: Diagnosis not present

## 2012-10-20 DIAGNOSIS — Z950 Presence of cardiac pacemaker: Secondary | ICD-10-CM | POA: Insufficient documentation

## 2012-10-20 DIAGNOSIS — E785 Hyperlipidemia, unspecified: Secondary | ICD-10-CM | POA: Diagnosis not present

## 2012-10-20 DIAGNOSIS — I459 Conduction disorder, unspecified: Secondary | ICD-10-CM | POA: Diagnosis not present

## 2012-10-20 DIAGNOSIS — J90 Pleural effusion, not elsewhere classified: Secondary | ICD-10-CM | POA: Diagnosis not present

## 2012-10-20 HISTORY — PX: PACEMAKER PLACEMENT: SHX43

## 2012-10-21 DIAGNOSIS — J9383 Other pneumothorax: Secondary | ICD-10-CM | POA: Diagnosis not present

## 2012-10-21 DIAGNOSIS — J90 Pleural effusion, not elsewhere classified: Secondary | ICD-10-CM | POA: Diagnosis not present

## 2012-10-21 DIAGNOSIS — I4891 Unspecified atrial fibrillation: Secondary | ICD-10-CM | POA: Diagnosis not present

## 2012-10-21 DIAGNOSIS — I459 Conduction disorder, unspecified: Secondary | ICD-10-CM | POA: Diagnosis not present

## 2012-10-21 DIAGNOSIS — I739 Peripheral vascular disease, unspecified: Secondary | ICD-10-CM | POA: Diagnosis not present

## 2012-10-21 DIAGNOSIS — I495 Sick sinus syndrome: Secondary | ICD-10-CM | POA: Diagnosis not present

## 2012-10-21 DIAGNOSIS — I10 Essential (primary) hypertension: Secondary | ICD-10-CM | POA: Diagnosis not present

## 2012-10-21 DIAGNOSIS — G20 Parkinson's disease: Secondary | ICD-10-CM | POA: Diagnosis not present

## 2012-10-26 ENCOUNTER — Ambulatory Visit (INDEPENDENT_AMBULATORY_CARE_PROVIDER_SITE_OTHER): Payer: Medicare Other | Admitting: Neurology

## 2012-10-26 ENCOUNTER — Encounter: Payer: Self-pay | Admitting: Neurology

## 2012-10-26 VITALS — BP 122/68 | HR 80 | Temp 97.9°F | Resp 16 | Wt 146.7 lb

## 2012-10-26 DIAGNOSIS — G20 Parkinson's disease: Secondary | ICD-10-CM | POA: Diagnosis not present

## 2012-10-26 MED ORDER — TRIHEXYPHENIDYL HCL 2 MG PO TABS: 1.0000 mg | ORAL_TABLET | Freq: Two times a day (BID) | ORAL | Status: DC

## 2012-10-26 MED ORDER — RASAGILINE MESYLATE 1 MG PO TABS: 1.0000 mg | ORAL_TABLET | Freq: Every day | ORAL | Status: DC

## 2012-10-26 NOTE — Progress Notes (Signed)
[name] [name] was seen today in the movement disorders clinic for neurologic consultation at the request of [name],[name], MD.  The consultation is for the evaluation of PD.  She has previously seen Dr. Sandria Manly and Dr. Rubin Payor.  I have some of Dr. Alena Bills notes.  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  She had a normal MRI brain at that time according to notes from Dr. love.  In 2011, her diagnosis was changed to Parkinson's disease and Azilect was added.  She had a second opinion at [facility] on 07/25/2009 and Dr. Rubin Payor also thought that she had Parkinson's disease.  In January, 2014 Dr. Sandria Manly added Artane, presumably for tremor.  She does think that it has helped the tremor.  The first symptom(s) the patient noticed was a stiff arm on the right and unilateral hand tremor on the R (foot/leg and arm) .  Specific Symptoms:  Tremor: yes Voice: hypophonic Sleep: sleeps well  Vivid Dreams:  no  Acting out dreams:  no Wet Pillows: yes Postural symptoms:  yes (not as good as used to be but much better  Falls?  yes but were years ago.  One fall resulted in wrist fx but she was getting up from beach chair and food caught back of chair and fell.  One year later, fell over stair as was talking to someone behind her and had head turned Bradykinesia symptoms: slow movements Loss of smell:  no (never been good) Loss of taste:  no Urinary Incontinence:  no Difficulty Swallowing:  no Handwriting, micrographia: yes Trouble with ADL's:  no  Trouble buttoning clothing: yes Depression:  no Memory changes:  no Hallucinations:  no  visual distortions: no N/V:  no Lightheaded:  yes (gone now.  Had PPM on wed and thinks that what was causing sx's)  Syncope: no Diplopia:  no Dyskinesia:  no  I reviewed her MRI of the brain from April, 2009.  It was unremarkable.  Formal report is below:  Clinical Data: Syncopal episode. Confusion.  MRI HEAD WITHOUT CONTRAST  MRA HEAD WITHOUT CONTRAST  Technique: Multiplanar,  multiecho pulse sequences of the brain and  surrounding structures were obtained according to standard protocol  with intravenous contrast. Angiographic images of the head were  obtained using MRA technique without contrast.  Comparison: No comparison MR. Prior CT 05/20/2007.  MRI HEAD  Findings: No acute infarct. No intracranial hemorrhage. No  hydrocephalous. No intracranial mass lesion detected on this  unenhanced exam. Major intracranial vascular structures are  patent. The ectatic vertebral arteries cause slight indentation  upon the medulla. Very minimal partial opacification mastoid air  cells.  IMPRESSION:  No acute infarct.  MRA HEAD  Findings: Ectasia of the distal vertical segment of the carotid  arteries bilaterally. Mild ectasia of the cavernous segment of the  internal carotid arteries bilaterally. Minimal narrowing proximal  A1 segment of the anterior cerebral arteries bilaterally.  Ectatic vertebral arteries and basilar artery. The AICAs are not  seen on either side. Remainder of major intracranial vascular  structures are patent. Minimal branch vessel irregularity. No  aneurysm visualized.  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  PREVIOUS MEDICATIONS: Azilect and artane  ALLERGIES:   Allergies  Allergen Reactions  . Statins     Leg weakness - but tolerating low dose Crestor every other day    CURRENT MEDICATIONS:  Current Outpatient Prescriptions on File Prior to Visit  Medication Sig Dispense Refill  .  B Complex Vitamins (B COMPLEX PO) Take 1 tablet by mouth daily.       [name] CALCIUM PO Take 1 tablet by mouth daily.       . Calcium Polycarbophil (FIBERCON PO) Take 1 tablet by mouth daily.       . Coenzyme Q10 (CO Q 10 PO) Take 1 tablet by mouth daily.       Tery Sanfilippo Sodium (COLACE PO) Take 1 capsule by mouth 2 (two) times daily.       [name] ezetimibe (ZETIA) 10 MG tablet Take 10 mg by mouth every other  day. Taking the same day as the crestor      . losartan (COZAAR) 25 MG tablet Take 25 mg by mouth daily.        . LUTEIN PO Take 1 tablet by mouth daily.      [name] MAGNESIUM PO Take 1 tablet by mouth daily.       . Multiple Vitamin (MULTIVITAMIN PO) Take 1 tablet by mouth daily.       . Omega-3 Fatty Acids (FISH OIL PO) Take 1 capsule by mouth daily.       . Rivaroxaban (XARELTO) 20 MG TABS Take 20 mg by mouth daily.      . rosuvastatin (CRESTOR) 5 MG tablet Take 2.5 mg by mouth every other day.      . valACYclovir (VALTREX) 500 MG tablet Take 500 mg by mouth daily as needed.      . Vitamin D, Ergocalciferol, (DRISDOL) 50000 UNITS CAPS Take 50,000 Units by mouth every 7 (seven) days.        No current facility-administered medications on file prior to visit.    PAST MEDICAL HISTORY:   Past Medical History  Diagnosis Date  . Hypertension   . Hyperlipidemia     a. Hx of leg weakness while on statin.  . Leg fracture Dec 21,2011  . Hip fracture 11-08-2008  . Chronic anticoagulation     on Xarelto  . High risk medication use     on Flecainide  . GERD (gastroesophageal reflux disease)   . Parkinson's disease 2011  . Arthritis     lt thumb  . Osteoporosis 07/2007  . PAF (paroxysmal atrial fibrillation)     s/p atrial fib ablation 11-11-11.  restarted on Toprol & Flecainide  . Atrial fibrillation     PAST SURGICAL HISTORY:   Past Surgical History  Procedure Laterality Date  . Knee surgery Left     2  . US echocardiography  03-10-2008    Est EF 55-60%  . Cardiovascular stress test  06-06-2008    EF 73%  . Wrist surgery    . Cardioversion  09/09/2011    Procedure: CARDIOVERSION;  Surgeon: Cassell Clement, MD;  Location: [facility] ENDOSCOPY;  Service: Cardiovascular;  Laterality: N/A;  to be done by dr. Patty Sermons  . Tee without cardioversion  11/10/2011    Procedure: TRANSESOPHAGEAL ECHOCARDIOGRAM (TEE);  Surgeon: Vesta Mixer, MD;  Location: [facility] ENDOSCOPY;  Service: Cardiovascular;   Laterality: N/A;  . Cardioversion  02/18/2012    Procedure: CARDIOVERSION;  Surgeon: Vesta Mixer, MD;  Location: [facility] ENDOSCOPY;  Service: Cardiovascular;  Laterality: N/A;  . Cholecystectomy  1995  . Tonsillectomy    . Breast surgery Left     benign cyst removed  . Femur fracture surgery    . Pacemaker placement      MRI compatible    SOCIAL HISTORY:   History   Social  History  . Marital Status: Married    Spouse Name: N/A    Number of Children: N/A  . Years of Education: N/A   Occupational History  . Not on file.   Social History Main Topics  . Smoking status: Never Smoker   . Smokeless tobacco: Not on file  . Alcohol Use: 7.2 oz/week    12 Glasses of wine per week     Comment: wine 2-3 glasses of wine a day  . Drug Use: No  . Sexual Activity: Yes    Partners: Male    Birth Control/ Protection: Post-menopausal   Other Topics Concern  . Not on file   Social History Narrative   Lives in [place].  Retired Comptroller.    FAMILY HISTORY:   Family Status  Relation Status Death Age  . Mother Deceased 74    AD   . Father Deceased 39    Valve-rheumatic fever as child  . Child Alive     healthy  . Child Alive     healthy    ROS:  A complete 10 system review of systems was obtained and was unremarkable apart from what is mentioned above.  PHYSICAL EXAMINATION:    VITALS:   Filed Vitals:   10/26/12 1324  BP: 122/68  Pulse: 80  Temp: 97.9 F (36.6 C)  Resp: 16  Weight: 146 lb 11.2 oz (66.543 kg)    GEN:  The patient appears stated age and is in NAD. HEENT:  Normocephalic, atraumatic.  The mucous membranes are moist. The superficial temporal arteries are without ropiness or tenderness. CV:  RRR.  There is a healing surgical incision with granulation tissue over the left chest.  There is swelling in the area as she just had a permanent pacemaker placed a few days ago.  There is no erythema. Lungs:  CTAB Neck/HEME:  There are no carotid bruits  bilaterally.  Neurological examination:  Orientation: The patient is alert and oriented x3. Fund of knowledge is appropriate.  Recent and remote memory are intact.  Attention and concentration are normal.    Able to name objects and repeat phrases. Cranial nerves: There is good facial symmetry. Pupils are equal round and reactive to light bilaterally. Fundoscopic exam reveals clear margins bilaterally. Extraocular muscles are intact. The visual fields are full to confrontational testing. The speech is fluent and clear. Soft palate rises symmetrically and there is no tongue deviation. Hearing is intact to conversational tone. Sensation: Sensation is intact to light and pinprick throughout (facial, trunk, extremities). Vibration is intact at the bilateral big toe. There is no extinction with double simultaneous stimulation. There is no sensory dermatomal level identified. Motor: Strength is 5/5 in the bilateral upper and lower extremities.   Shoulder shrug is equal and symmetric.  There is no pronator drift. Deep tendon reflexes: Deep tendon reflexes are 2/4 at the bilateral biceps, triceps, brachioradialis, patella and achilles. Plantar responses are downgoing bilaterally.  Movement examination: Tone: There is mild increased tone in the right upper and right lower extremity.  Tone in the left is normal. Abnormal movements: There is a mild, intermittent resting tremor of the right upper and lower extremity.  When she ambulates, she has a tremor of both upper extremities. Coordination:  There is minimal decremation with RAM's Gait and Station: The patient has no difficulty arising out of a deep-seated chair without the use of the hands. The patient's stride length is mildly decreased.  Tremor increases with ambulation.  The patient  has a negative pull test.      ASSESSMENT/PLAN:  1.  Parkinsonism.  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  The  patient has tremor, bradykinesia, rigidity and mild postural instability.  -We discussed the diagnosis as well as pathophysiology of the disease.  We discussed treatment options as well as prognostic indicators.  Patient education was provided.  -Greater than 50% of the 80 minute visit was spent in counseling answering questions and talking about what to expect now as well as in the future.  We talked about medication options as well as potential future surgical options.  We talked about safety in the home.  -We discussed in detail the Artane which was added in January for tremor.  It is likely one of the best tremor drugs, but in her age group, there potentially can be significant side effects.  We talked about side effects of cognitive dulling, hallucinations, gait instability and dryness, but she seems to be doing fairly well and decided to continue on it for now.  She admits that she really is only taking tablet in the morning and generally forgoes dosages the rest of the day.  -For now, we decided to leave her on Azilect.  I am really not sure that he it is doing very much.  -I do think that she would benefit from the addition of levodopa.  She does have rigidity and tremor on the right.  We decided to hold off on that today, but she is going to think about it for the next few months.  She just had her pacemaker put in last week and would like to see how she feels now that she has this.  I think that is very reasonable.  -I talked to her about a support group that meets the third Tuesday after every month.  I talked to her about [facility] Parkinson's program [facility].  Patient education was provided. 2.  Follow up in the next few months, sooner should new neurologic issues arise.  In the meantime, alternatives lab work from Dr. Waynard Edwards.

## 2012-10-26 NOTE — Patient Instructions (Addendum)
Make a follow up appointment for January 2015 on your way out today. Thank You!

## 2012-11-04 DIAGNOSIS — Z23 Encounter for immunization: Secondary | ICD-10-CM | POA: Diagnosis not present

## 2012-11-04 DIAGNOSIS — E039 Hypothyroidism, unspecified: Secondary | ICD-10-CM | POA: Diagnosis not present

## 2012-11-05 DIAGNOSIS — Z950 Presence of cardiac pacemaker: Secondary | ICD-10-CM | POA: Diagnosis not present

## 2012-11-05 DIAGNOSIS — I4891 Unspecified atrial fibrillation: Secondary | ICD-10-CM | POA: Diagnosis not present

## 2012-11-23 ENCOUNTER — Observation Stay (HOSPITAL_COMMUNITY)
Admission: EM | Admit: 2012-11-23 | Discharge: 2012-11-24 | Disposition: A | Discharge status: Home / Self Care | Payer: Medicare Other | Attending: Internal Medicine | Admitting: Internal Medicine

## 2012-11-23 ENCOUNTER — Emergency Department (HOSPITAL_COMMUNITY): Payer: Medicare Other

## 2012-11-23 ENCOUNTER — Encounter (HOSPITAL_COMMUNITY): Payer: Self-pay | Admitting: Emergency Medicine

## 2012-11-23 DIAGNOSIS — I4891 Unspecified atrial fibrillation: Secondary | ICD-10-CM | POA: Insufficient documentation

## 2012-11-23 DIAGNOSIS — Z7901 Long term (current) use of anticoagulants: Secondary | ICD-10-CM | POA: Insufficient documentation

## 2012-11-23 DIAGNOSIS — J811 Chronic pulmonary edema: Secondary | ICD-10-CM | POA: Diagnosis not present

## 2012-11-23 DIAGNOSIS — G20 Parkinson's disease: Secondary | ICD-10-CM | POA: Insufficient documentation

## 2012-11-23 DIAGNOSIS — Z950 Presence of cardiac pacemaker: Secondary | ICD-10-CM | POA: Diagnosis not present

## 2012-11-23 DIAGNOSIS — I10 Essential (primary) hypertension: Secondary | ICD-10-CM | POA: Insufficient documentation

## 2012-11-23 DIAGNOSIS — E785 Hyperlipidemia, unspecified: Secondary | ICD-10-CM | POA: Diagnosis not present

## 2012-11-23 DIAGNOSIS — J90 Pleural effusion, not elsewhere classified: Secondary | ICD-10-CM | POA: Insufficient documentation

## 2012-11-23 DIAGNOSIS — K219 Gastro-esophageal reflux disease without esophagitis: Secondary | ICD-10-CM | POA: Diagnosis not present

## 2012-11-23 DIAGNOSIS — G20A1 Parkinson's disease without dyskinesia, without mention of fluctuations: Secondary | ICD-10-CM | POA: Insufficient documentation

## 2012-11-23 DIAGNOSIS — R0600 Dyspnea, unspecified: Secondary | ICD-10-CM

## 2012-11-23 DIAGNOSIS — R0602 Shortness of breath: Secondary | ICD-10-CM

## 2012-11-23 DIAGNOSIS — I5033 Acute on chronic diastolic (congestive) heart failure: Principal | ICD-10-CM | POA: Insufficient documentation

## 2012-11-23 DIAGNOSIS — I509 Heart failure, unspecified: Secondary | ICD-10-CM | POA: Diagnosis not present

## 2012-11-23 DIAGNOSIS — I5031 Acute diastolic (congestive) heart failure: Secondary | ICD-10-CM | POA: Diagnosis not present

## 2012-11-23 LAB — BASIC METABOLIC PANEL
BUN: 12 mg/dL (ref 6–23)
CO2: 26 mEq/L (ref 19–32)
Calcium: 9.1 mg/dL (ref 8.4–10.5)
Chloride: 98 mEq/L (ref 96–112)
Creatinine, Ser: 0.76 mg/dL (ref 0.50–1.10)
GFR calc Af Amer: >90 mL/min (ref >90)
GFR calc non Af Amer: 83 mL/min — ABNORMAL LOW (ref >90)
Glucose, Bld: 91 mg/dL (ref 70–99)
Potassium: 4.4 mEq/L (ref 3.5–5.1)
Sodium: 133 mEq/L — ABNORMAL LOW (ref 135–145)

## 2012-11-23 LAB — CBC
HCT: 32.8 % — ABNORMAL LOW (ref 36.0–46.0)
Hemoglobin: 11 g/dL — ABNORMAL LOW (ref 12.0–15.0)
MCH: 29.3 pg (ref 26.0–34.0)
MCHC: 33.5 g/dL (ref 30.0–36.0)
MCV: 87.2 fL (ref 78.0–100.0)
Platelets: 194 10*3/uL (ref 150–400)
RBC: 3.76 MIL/uL — ABNORMAL LOW (ref 3.87–5.11)
RDW: 14.9 % (ref 11.5–15.5)
WBC: 5.9 10*3/uL (ref 4.0–10.5)

## 2012-11-23 LAB — PRO B NATRIURETIC PEPTIDE: Pro B Natriuretic peptide (BNP): 622.3 pg/mL — ABNORMAL HIGH (ref 0–125)

## 2012-11-23 LAB — PROTIME-INR
INR: 1.29 (ref 0.00–1.49)
Prothrombin Time: 15.8 seconds — ABNORMAL HIGH (ref 11.6–15.2)

## 2012-11-23 LAB — POCT I-STAT TROPONIN I: Troponin i, poc: 0 ng/mL (ref 0.00–0.08)

## 2012-11-23 MED ORDER — VITAMIN D (ERGOCALCIFEROL) 1.25 MG (50000 UNIT) PO CAPS: 50000.0000 [IU] | ORAL_CAPSULE | ORAL | Status: DC

## 2012-11-23 MED ORDER — LOSARTAN POTASSIUM 25 MG PO TABS
25.0000 mg | ORAL_TABLET | Freq: Every day | ORAL | Status: DC
  Administered 2012-11-24: 11:00:00 25 mg via ORAL
  Filled 2012-11-23: qty 1

## 2012-11-23 MED ORDER — ASPIRIN EC 81 MG PO TBEC
81.0000 mg | DELAYED_RELEASE_TABLET | Freq: Every day | ORAL | Status: DC
  Administered 2012-11-24: 81 mg via ORAL
  Filled 2012-11-23: qty 1

## 2012-11-23 MED ORDER — RIVAROXABAN 20 MG PO TABS: 20.0000 mg | ORAL_TABLET | Freq: Every day | ORAL | Status: DC

## 2012-11-23 MED ORDER — FUROSEMIDE 10 MG/ML IJ SOLN
20.0000 mg | Freq: Once | INTRAMUSCULAR | Status: AC
  Administered 2012-11-23: 20 mg via INTRAVENOUS
  Filled 2012-11-23: qty 2

## 2012-11-23 MED ORDER — SODIUM CHLORIDE 0.9 % IJ SOLN
3.0000 mL | Freq: Two times a day (BID) | INTRAMUSCULAR | Status: DC
  Administered 2012-11-24 (×2): 3 mL via INTRAVENOUS

## 2012-11-23 MED ORDER — ACETAMINOPHEN 325 MG PO TABS: 650.0000 mg | ORAL_TABLET | ORAL | Status: DC | PRN

## 2012-11-23 MED ORDER — SODIUM CHLORIDE 0.9 % IV SOLN: 250.0000 mL | INTRAVENOUS | Status: DC | PRN

## 2012-11-23 MED ORDER — SODIUM CHLORIDE 0.9 % IJ SOLN: 3.0000 mL | INTRAMUSCULAR | Status: DC | PRN

## 2012-11-23 MED ORDER — FUROSEMIDE 10 MG/ML IJ SOLN
20.0000 mg | Freq: Two times a day (BID) | INTRAMUSCULAR | Status: DC
  Administered 2012-11-24: 08:00:00 20 mg via INTRAVENOUS
  Filled 2012-11-23 (×2): qty 2

## 2012-11-23 MED ORDER — ASPIRIN 325 MG PO TABS
325.0000 mg | ORAL_TABLET | ORAL | Status: AC
  Administered 2012-11-23: 325 mg via ORAL
  Filled 2012-11-23 (×2): qty 1

## 2012-11-23 MED ORDER — EZETIMIBE 10 MG PO TABS
10.0000 mg | ORAL_TABLET | ORAL | Status: DC
  Administered 2012-11-24: 11:00:00 10 mg via ORAL
  Filled 2012-11-23: qty 1

## 2012-11-23 MED ORDER — NON FORMULARY: 2.5000 mg | Status: DC

## 2012-11-23 MED ORDER — METOPROLOL TARTRATE 25 MG PO TABS
25.0000 mg | ORAL_TABLET | Freq: Two times a day (BID) | ORAL | Status: DC
  Administered 2012-11-24: 25 mg via ORAL
  Filled 2012-11-23 (×2): qty 1

## 2012-11-23 MED ORDER — RASAGILINE MESYLATE 1 MG PO TABS
1.0000 mg | ORAL_TABLET | Freq: Every day | ORAL | Status: DC
  Administered 2012-11-24: 1 mg via ORAL
  Filled 2012-11-23: qty 1

## 2012-11-23 MED ORDER — DOCUSATE SODIUM 100 MG PO CAPS
100.0000 mg | ORAL_CAPSULE | Freq: Two times a day (BID) | ORAL | Status: DC
  Administered 2012-11-24 (×2): 100 mg via ORAL
  Filled 2012-11-23 (×3): qty 1

## 2012-11-23 MED ORDER — ONDANSETRON HCL 4 MG/2ML IJ SOLN: 4.0000 mg | Freq: Four times a day (QID) | INTRAMUSCULAR | Status: DC | PRN

## 2012-11-23 MED ORDER — ROSUVASTATIN CALCIUM 5 MG PO TABS
2.5000 mg | ORAL_TABLET | ORAL | Status: DC
  Administered 2012-11-24: 11:00:00 2.5 mg via ORAL
  Filled 2012-11-23: qty 0.5

## 2012-11-23 NOTE — H&P (Signed)
[name] [name] is an 72 y.o. female.   Chief Complaint: Shortness of breath HPI: [name] [name] is a 72 yo woman with PMH of hypertension, dyslipidemia, GERD, atrial fibrillation with multiple prior ablations and convergence procedure on chronic anticoagulation, parkinson's disease and recent pacemaker placement in September 2014 who comes in after being seen by PCP for shortness of breath and some pulmonary edema. On evaluation, [name] [name] is accompanied by her husband. She tells me she's had shortness of breath with activity for at least 1 month and really hasn't felt that week since early to mid summer. At that time she was able [place]vertical[place] in approximately 1 hour. However, since then she's noticeably short of breath with minimal walking and even starting to climb steps which she now avoids. She's previously had lasix before her ablations and convergence procedure but has never been treated for heart failure before. She notes bleeding from her mouth with xarelto at times but otherwise, no sick contacts, no fever/chills/nausea/vomiting/diarrhea. No syncope. No chest pain. We had a long discussion about coming into the hospital vs. Discharge home but she has an appointment with Dr. Smith Robert at [facility] on Friday and given elevated volume status on exam and pulmonary edema, she decided to get some IV diuresis and echocardiogram in AM.   Past Medical History  Diagnosis Date  . Hypertension   . Hyperlipidemia     a. Hx of leg weakness while on statin.  . Leg fracture Dec 21,2011  . Hip fracture 11-08-2008  . Chronic anticoagulation     on Xarelto  . High risk medication use     on Flecainide  . GERD (gastroesophageal reflux disease)   . Parkinson's disease 2011  . Arthritis     lt thumb  . Osteoporosis 07/2007  . PAF (paroxysmal atrial fibrillation)     s/p atrial fib ablation 11-11-11.  restarted on Toprol & Flecainide  . Atrial fibrillation      Past Surgical History  Procedure Laterality Date  . Knee surgery Left     2  . US echocardiography  03-10-2008    Est EF 55-60%  . Cardiovascular stress test  06-06-2008    EF 73%  . Wrist surgery    . Cardioversion  09/09/2011    Procedure: CARDIOVERSION;  Surgeon: Cassell Clement, MD;  Location: [facility] ENDOSCOPY;  Service: Cardiovascular;  Laterality: N/A;  to be done by dr. Patty Sermons  . Tee without cardioversion  11/10/2011    Procedure: TRANSESOPHAGEAL ECHOCARDIOGRAM (TEE);  Surgeon: Vesta Mixer, MD;  Location: [facility] ENDOSCOPY;  Service: Cardiovascular;  Laterality: N/A;  . Cardioversion  02/18/2012    Procedure: CARDIOVERSION;  Surgeon: Vesta Mixer, MD;  Location: [facility] ENDOSCOPY;  Service: Cardiovascular;  Laterality: N/A;  . Cholecystectomy  1995  . Tonsillectomy    . Breast surgery Left     benign cyst removed  . Femur fracture surgery    . Pacemaker placement      MRI compatible    Family History  Problem Relation Age of Onset  . Alzheimer's disease Mother   . Heart failure Father    Social History:  reports that she has never smoked. She does not have any smokeless tobacco history on file. She reports that she drinks about 7.2 ounces of alcohol per week. She reports that she does not use illicit drugs.  Allergies:  Allergies  Allergen Reactions  . Statins     Leg weakness -  but tolerating low dose Crestor every other day    Medications Prior to Admission  Medication Sig Dispense Refill  . B Complex Vitamins (B COMPLEX PO) Take 1 tablet by mouth daily.       [name] CALCIUM PO Take 1 tablet by mouth daily.       . Calcium Polycarbophil (FIBERCON PO) Take 1 tablet by mouth daily.       . Coenzyme Q10 (CO Q 10 PO) Take 1 tablet by mouth daily.       Tery Sanfilippo Sodium (COLACE PO) Take 1 capsule by mouth 2 (two) times daily.       [name] ezetimibe (ZETIA) 10 MG tablet Take 10 mg by mouth every other day. Taking the same day as the crestor      . losartan (COZAAR) 25 MG tablet  Take 25 mg by mouth daily.        . LUTEIN PO Take 1 tablet by mouth daily.      [name] MAGNESIUM PO Take 1 tablet by mouth daily.       . metoprolol (LOPRESSOR) 50 MG tablet Take 25 mg by mouth once.       . Multiple Vitamin (MULTIVITAMIN PO) Take 1 tablet by mouth daily.       . Omega-3 Fatty Acids (FISH OIL PO) Take 1 capsule by mouth daily.       . rasagiline (AZILECT) 1 MG TABS tablet Take 1 tablet (1 mg total) by mouth daily.  90 tablet  3  . Rivaroxaban (XARELTO) 20 MG TABS Take 20 mg by mouth daily.      . rosuvastatin (CRESTOR) 5 MG tablet Take 2.5 mg by mouth every other day.      . valACYclovir (VALTREX) 500 MG tablet Take 500 mg by mouth daily as needed.      . Vitamin D, Ergocalciferol, (DRISDOL) 50000 UNITS CAPS Take 50,000 Units by mouth every 7 (seven) days.         Results for orders placed during the hospital encounter of 11/23/12 (from the past 48 hour(s))  CBC     Status: Abnormal   Collection Time    11/23/12  5:51 PM      Result Value Range   WBC 5.9  4.0 - 10.5 K/uL   RBC 3.76 (*) 3.87 - 5.11 MIL/uL   Hemoglobin 11.0 (*) 12.0 - 15.0 g/dL   HCT 25.9 (*) 56.3 - 87.5 %   MCV 87.2  78.0 - 100.0 fL   MCH 29.3  26.0 - 34.0 pg   MCHC 33.5  30.0 - 36.0 g/dL   RDW 64.3  32.9 - 51.8 %   Platelets 194  150 - 400 K/uL  BASIC METABOLIC PANEL     Status: Abnormal   Collection Time    11/23/12  5:51 PM      Result Value Range   Sodium 133 (*) 135 - 145 mEq/L   Potassium 4.4  3.5 - 5.1 mEq/L   Chloride 98  96 - 112 mEq/L   CO2 26  19 - 32 mEq/L   Glucose, Bld 91  70 - 99 mg/dL   BUN 12  6 - 23 mg/dL   Creatinine, Ser 8.41  0.50 - 1.10 mg/dL   Calcium 9.1  8.4 - 66.0 mg/dL   GFR calc non Af Amer 83 (*) >90 mL/min   GFR calc Af Amer >90  >90 mL/min   Comment: (NOTE)     The eGFR has  been calculated using the CKD EPI equation.     This calculation has not been validated in all clinical situations.     eGFR's persistently <90 mL/min signify possible Chronic Kidney      Disease.  PROTIME-INR     Status: Abnormal   Collection Time    11/23/12  5:51 PM      Result Value Range   Prothrombin Time 15.8 (*) 11.6 - 15.2 seconds   INR 1.29  0.00 - 1.49  PRO B NATRIURETIC PEPTIDE     Status: Abnormal   Collection Time    11/23/12  5:57 PM      Result Value Range   Pro B Natriuretic peptide (BNP) 622.3 (*) 0 - 125 pg/mL  POCT I-STAT TROPONIN I     Status: None   Collection Time    11/23/12  6:07 PM      Result Value Range   Troponin i, poc 0.00  0.00 - 0.08 ng/mL   Comment 3            Comment: Due to the release kinetics of cTnI,     a negative result within the first hours     of the onset of symptoms does not rule out     myocardial infarction with certainty.     If myocardial infarction is still suspected,     repeat the test at appropriate intervals.   Dg Chest 2 View  11/23/2012   CLINICAL DATA:  Short of breath  EXAM: CHEST  2 VIEW  COMPARISON:  09/02/2011  FINDINGS: Mild cardiac enlargement. Surgical clip on the left atrial appendage. Dual lead pacemaker in good position.  Pulmonary vascular congestion with interstitial edema and right pleural effusion.  IMPRESSION: Congestive heart failure with interstitial edema and a small right effusion.   Electronically Signed   By: Marlan Palau M.D.   On: 11/23/2012 21:03    Review of Systems  Constitutional: Positive for malaise/fatigue. Negative for fever and chills.  HENT: Negative for ear pain and hearing loss.   Eyes: Negative for double vision, photophobia and pain.  Respiratory: Positive for shortness of breath. Negative for cough and hemoptysis.   Cardiovascular: Positive for leg swelling. Negative for chest pain and palpitations.  Gastrointestinal: Negative for nausea, vomiting and abdominal pain.  Genitourinary: Negative for dysuria and frequency.  Musculoskeletal: Negative for back pain and myalgias.  Skin: Negative for rash.  Neurological: Negative for dizziness, tingling, tremors and  headaches.  Endo/Heme/Allergies: Negative for environmental allergies. Bruises/bleeds easily.  Psychiatric/Behavioral: Negative for depression, suicidal ideas and substance abuse.    Blood pressure 146/71, pulse 63, temperature 98.1 F (36.7 C), temperature source Oral, resp. rate 18, height 5\' 2"  (1.575 m), weight 67 kg (147 lb 11.3 oz), last menstrual period 02/04/1996, SpO2 98.00%. Physical Exam  Nursing note and vitals reviewed. Constitutional: She is oriented to person, place, and time. She appears well-developed and well-nourished. No distress.  HENT:  Head: Normocephalic and atraumatic.  Nose: Nose normal.  Mouth/Throat: Oropharynx is clear and moist. No oropharyngeal exudate.  Eyes: Conjunctivae and EOM are normal. Pupils are equal, round, and reactive to light. No scleral icterus.  Neck: Normal range of motion. Neck supple. JVD present. No tracheal deviation present.  JVD to just under mandible of jaw  Cardiovascular: Normal rate, regular rhythm, normal heart sounds and intact distal pulses.  Exam reveals no gallop.   No murmur heard. Respiratory: Effort normal. No respiratory distress. She has no wheezes. She  has rales.  Bibasilar rales at bases  GI: Soft. Bowel sounds are normal. She exhibits no distension. There is no tenderness.  Musculoskeletal: Normal range of motion. She exhibits edema. She exhibits no tenderness.  Neurological: She is alert and oriented to person, place, and time. No cranial nerve deficit. Coordination normal.  Skin: Skin is warm and dry. No rash noted. She is not diaphoretic. No erythema.  Psychiatric: She has a normal mood and affect. Her behavior is normal. Judgment and thought content normal.   labs reviewed; na 133, K 4.4, bun/cr 12/0.76, trop 0.00, BNP 622, INR 1.3, h/h 11/32.8, plt 194, wbc 5.9 ECG NSR, RAD, HR 81, 1st degree AVB, poor R wave progression Chest x-ray: mild pulmonary edema, right pleural effusion and RA and RV lead look well  positioned Echo results reviewed from 10/13 with preserved EF (TEE)  Problem List Acute heart failure - ? Diastolic Shortness of breath Atrial fibrillation s/p multiple ablations with 1 convergence procedure at [facility] Elevated BNP Pulmonary congestion on Chest x-ray Anemia RBBB on ECG Recent dual chamber PM placed Hypertension  Assessment/Plan 72 yo woman with PMH of hypertension, atrial fibrillation, anemia, recent dual chamber PM 09/14 here with shortness of breath, pulmonary congestion on chest x-ray and elevated BNP. Differential diagnosis is atrial fibrillation, development of systolic or diastolic heart failure, pericardial effusion related to recent pacemaker placement and other causes. I favor diagnosis of diastolic heart failure. We will pursue diuresis given elevated JVP, lower extremity edema with lasix 20 mg IV with close electrolytes following. We will update echocardiogram to evaluate for potential pericardial effusion. Will also optimize medications and based on EF consider long-acting metoprolol. I discussed plan with [name] [name] and her husband at length - she will want to stay in the hospital as short a period of time as possible.  - telemetry, observation - IV lasix 20 mg in ER; then 20 mg IV bid written for - early AM BMP, Mg - continue bb, losartan - on asa 81 mg while trending cardiac markers - continue xarelto for atrial fibrillation - defer device interrogation (medtronic) to AM team - replete electrolytes - Echocardiogram order placed to evaluate for pericardial effusion and update LV function - on zetia and crestor 2.5 qod  Rotha Cassels 11/23/2012, 11:48 PM

## 2012-11-23 NOTE — ED Provider Notes (Signed)
CSN: [id number]     Arrival date & time 11/23/12  1733 History   First MD Initiated Contact with Patient 11/23/12 1940     Chief Complaint  Patient presents with  . Shortness of Breath   (Consider location/radiation/quality/duration/timing/severity/associated sxs/prior Treatment) Patient is a 72 y.o. female presenting with shortness of breath.  Shortness of Breath Severity:  Severe Onset quality:  Gradual Duration:  1 week Timing:  Constant Progression:  Worsening Chronicity:  New Context: activity   Relieved by:  Rest Worsened by:  Exertion Associated symptoms: cough (nonproductive)   Associated symptoms: no abdominal pain, no chest pain, no fever, no rash, no sore throat, no syncope and no vomiting     Past Medical History  Diagnosis Date  . Hypertension   . Hyperlipidemia     a. Hx of leg weakness while on statin.  . Leg fracture Dec 21,2011  . Hip fracture 11-08-2008  . Chronic anticoagulation     on Xarelto  . High risk medication use     on Flecainide  . GERD (gastroesophageal reflux disease)   . Parkinson's disease 2011  . Arthritis     lt thumb  . Osteoporosis 07/2007  . PAF (paroxysmal atrial fibrillation)     s/p atrial fib ablation 11-11-11.  restarted on Toprol & Flecainide  . Atrial fibrillation    Past Surgical History  Procedure Laterality Date  . Knee surgery Left     2  . US echocardiography  03-10-2008    Est EF 55-60%  . Cardiovascular stress test  06-06-2008    EF 73%  . Wrist surgery    . Cardioversion  09/09/2011    Procedure: CARDIOVERSION;  Surgeon: Cassell Clement, MD;  Location: [facility] ENDOSCOPY;  Service: Cardiovascular;  Laterality: N/A;  to be done by dr. Patty Sermons  . Tee without cardioversion  11/10/2011    Procedure: TRANSESOPHAGEAL ECHOCARDIOGRAM (TEE);  Surgeon: Vesta Mixer, MD;  Location: [facility] ENDOSCOPY;  Service: Cardiovascular;  Laterality: N/A;  . Cardioversion  02/18/2012    Procedure: CARDIOVERSION;  Surgeon: Vesta Mixer, MD;   Location: [facility] ENDOSCOPY;  Service: Cardiovascular;  Laterality: N/A;  . Cholecystectomy  1995  . Tonsillectomy    . Breast surgery Left     benign cyst removed  . Femur fracture surgery    . Pacemaker placement      MRI compatible   Family History  Problem Relation Age of Onset  . Alzheimer's disease Mother   . Heart failure Father    History  Substance Use Topics  . Smoking status: Never Smoker   . Smokeless tobacco: Not on file  . Alcohol Use: 7.2 oz/week    12 Glasses of wine per week     Comment: wine 2-3 glasses of wine a day   OB History   Grav Para Term Preterm Abortions TAB SAB Ect Mult Living                 Review of Systems  Constitutional: Negative for fever and chills.  HENT: Negative for congestion, rhinorrhea and sore throat.   Eyes: Negative for photophobia and visual disturbance.  Respiratory: Positive for cough (nonproductive) and shortness of breath.   Cardiovascular: Negative for chest pain, leg swelling and syncope.  Gastrointestinal: Negative for nausea, vomiting, abdominal pain, diarrhea and constipation.  Endocrine: Negative for polyphagia and polyuria.  Genitourinary: Negative for dysuria, flank pain, vaginal bleeding, vaginal discharge and enuresis.  Musculoskeletal: Negative for back pain and gait problem.  Skin: Negative for color change and rash.  Neurological: Negative for dizziness, syncope, light-headedness and numbness.  Hematological: Negative for adenopathy. Does not bruise/bleed easily.  All other systems reviewed and are negative.    Allergies  Statins  Home Medications   No current outpatient prescriptions on file. BP 146/71  Pulse 63  Temp(Src) 98.1 F (36.7 C) (Oral)  Resp 18  Ht 5\' 2"  (1.575 m)  Wt 147 lb 11.3 oz (67 kg)  BMI 27.01 kg/m2  SpO2 98%  LMP 02/04/1996 Physical Exam  Vitals reviewed. Constitutional: She is oriented to person, place, and time. She appears well-developed and well-nourished.  HENT:  Head:  Normocephalic and atraumatic.  Right Ear: External ear normal.  Left Ear: External ear normal.  Eyes: Conjunctivae and EOM are normal. Pupils are equal, round, and reactive to light.  Neck: Normal range of motion. Neck supple.  Cardiovascular: Normal rate, regular rhythm, normal heart sounds and intact distal pulses.   Pulmonary/Chest: Effort normal and breath sounds normal.  Abdominal: Soft. Bowel sounds are normal. There is no tenderness.  Musculoskeletal: Normal range of motion.       Right lower leg: She exhibits edema.       Left lower leg: She exhibits edema.  Neurological: She is alert and oriented to person, place, and time.  Skin: Skin is warm and dry.    ED Course  Procedures (including critical care time) Labs Review Labs Reviewed  CBC - Abnormal; Notable for the following:    RBC 3.76 (*)    Hemoglobin 11.0 (*)    HCT 32.8 (*)    All other components within normal limits  BASIC METABOLIC PANEL - Abnormal; Notable for the following:    Sodium 133 (*)    GFR calc non Af Amer 83 (*)    All other components within normal limits  PRO B NATRIURETIC PEPTIDE - Abnormal; Notable for the following:    Pro B Natriuretic peptide (BNP) 622.3 (*)    All other components within normal limits  PROTIME-INR - Abnormal; Notable for the following:    Prothrombin Time 15.8 (*)    All other components within normal limits  BASIC METABOLIC PANEL  COMPREHENSIVE METABOLIC PANEL  TSH  TROPONIN I  TROPONIN I  MAGNESIUM  POCT I-STAT TROPONIN I   Imaging Review Dg Chest 2 View  11/23/2012   CLINICAL DATA:  Short of breath  EXAM: CHEST  2 VIEW  COMPARISON:  09/02/2011  FINDINGS: Mild cardiac enlargement. Surgical clip on the left atrial appendage. Dual lead pacemaker in good position.  Pulmonary vascular congestion with interstitial edema and right pleural effusion.  IMPRESSION: Congestive heart failure with interstitial edema and a small right effusion.   Electronically Signed   By:  Marlan Palau M.D.   On: 11/23/2012 21:03    EKG Interpretation     Ventricular Rate:  86 PR Interval:    QRS Duration: 68 QT Interval:  384 QTC Calculation: 459 R Axis:   110 Text Interpretation:  Normal sinus rhythm Right axis deviation with 1st degree A-V block Low voltage QRS Non-specific ST-t changes No significant change since last tracing            Date: 11/23/2012  Rate: 81  Rhythm: normal sinus rhythm  QRS Axis: normal  Intervals: prolonged PR  ST/T Wave abnormalities: normal  Conduction Disutrbances: none  Narrative Interpretation: NSR  Old EKG Reviewed: Sinus has replaced afib    MDM   1. Heart failure,  acute diastolic   2. Dyspnea    72 y.o. female  with pertinent PMH of HTN, parkinsons, afib presents with shortness of breath. Patient states that she has been increasingly short of the past week, however over the past 5 weeks has complained of increased shortness of breath while walking after placement of her pacemaker. She denies recent chest pain, fever, nausea, vomiting, other infectious symptoms. Symptoms primarily noted on exertion. She was seen by her primary care doctor today had a chest X. are performed which demonstrated questionable effusion. Arrival vital signs as above, physical exam with mild bilateral pedal edema.  Labs as above demonstrated mild elevation in BNP, troponin is negative.  Consulted cardiology who will admit patient for further workup.  Labs and imaging as above reviewed by myself and attending,Dr. Juleen China, with whom case was discussed.   1. Heart failure, acute diastolic   2. Dyspnea         Noel Gerold, MD 11/24/12 [phone]

## 2012-11-23 NOTE — ED Notes (Signed)
Pt ambulated to restroom unassisted with steady gait

## 2012-11-23 NOTE — ED Notes (Addendum)
Presents with "A few days" of worsening SOB with exertion. Sitting still makes SOB better. Denies pain in chest. Went to Dr. Albertina Parr today and sent over due to abnormal chest x ray and CHF. Denies chest pain. No pedal edema. Sats 97% RA. PAcemaker placed 5 weeks ago.

## 2012-11-24 ENCOUNTER — Telehealth: Payer: Self-pay | Admitting: Internal Medicine

## 2012-11-24 ENCOUNTER — Encounter (HOSPITAL_COMMUNITY): Payer: Self-pay | Admitting: Physician Assistant

## 2012-11-24 ENCOUNTER — Telehealth: Payer: Self-pay | Admitting: Physician Assistant

## 2012-11-24 DIAGNOSIS — I4891 Unspecified atrial fibrillation: Secondary | ICD-10-CM

## 2012-11-24 DIAGNOSIS — I517 Cardiomegaly: Secondary | ICD-10-CM

## 2012-11-24 DIAGNOSIS — I5031 Acute diastolic (congestive) heart failure: Secondary | ICD-10-CM

## 2012-11-24 LAB — COMPREHENSIVE METABOLIC PANEL
ALT: 21 U/L (ref 0–35)
Alkaline Phosphatase: 73 U/L (ref 39–117)
BUN: 12 mg/dL (ref 6–23)
CO2: 21 mEq/L (ref 19–32)
GFR calc Af Amer: >90 mL/min (ref >90)
GFR calc non Af Amer: 87 mL/min — ABNORMAL LOW (ref >90)
Glucose, Bld: 132 mg/dL — ABNORMAL HIGH (ref 70–99)
Potassium: 3.9 mEq/L (ref 3.5–5.1)
Sodium: 135 mEq/L (ref 135–145)

## 2012-11-24 LAB — BASIC METABOLIC PANEL
CO2: 28 mEq/L (ref 19–32)
Chloride: 100 mEq/L (ref 96–112)
Creatinine, Ser: 0.74 mg/dL (ref 0.50–1.10)
GFR calc non Af Amer: 84 mL/min — ABNORMAL LOW (ref >90)
Glucose, Bld: 97 mg/dL (ref 70–99)
Sodium: 138 mEq/L (ref 135–145)

## 2012-11-24 LAB — MAGNESIUM: Magnesium: 2 mg/dL (ref 1.5–2.5)

## 2012-11-24 MED ORDER — METOPROLOL TARTRATE 25 MG PO TABS: 25.0000 mg | ORAL_TABLET | Freq: Two times a day (BID) | ORAL | Status: DC

## 2012-11-24 MED ORDER — FUROSEMIDE 20 MG PO TABS
20.0000 mg | ORAL_TABLET | Freq: Every day | ORAL | Status: DC
  Administered 2012-11-24: 11:00:00 20 mg via ORAL
  Filled 2012-11-24: qty 1

## 2012-11-24 MED ORDER — FUROSEMIDE 20 MG PO TABS: 20.0000 mg | ORAL_TABLET | Freq: Every day | ORAL | Status: DC

## 2012-11-24 MED ORDER — POTASSIUM CHLORIDE CRYS ER 10 MEQ PO TBCR: 10.0000 meq | EXTENDED_RELEASE_TABLET | Freq: Every day | ORAL | Status: DC

## 2012-11-24 MED ORDER — RIVAROXABAN 20 MG PO TABS
20.0000 mg | ORAL_TABLET | Freq: Every day | ORAL | Status: DC
  Administered 2012-11-24: 01:00:00 20 mg via ORAL
  Filled 2012-11-24 (×2): qty 1

## 2012-11-24 MED ORDER — POTASSIUM CHLORIDE CRYS ER 10 MEQ PO TBCR
10.0000 meq | EXTENDED_RELEASE_TABLET | Freq: Every day | ORAL | Status: DC
  Administered 2012-11-24: 10 meq via ORAL
  Filled 2012-11-24: qty 1

## 2012-11-24 MED ORDER — ASPIRIN 81 MG PO TBEC: 81.0000 mg | DELAYED_RELEASE_TABLET | Freq: Every day | ORAL | Status: DC

## 2012-11-24 MED ORDER — DOFETILIDE 250 MCG PO CAPS
250.0000 ug | ORAL_CAPSULE | Freq: Two times a day (BID) | ORAL | Status: DC
  Administered 2012-11-24: 11:00:00 250 ug via ORAL
  Filled 2012-11-24 (×3): qty 1

## 2012-11-24 NOTE — Telephone Encounter (Signed)
New problem    7day TCM on 12/01/12 @ 3:20 per [place] PA

## 2012-11-24 NOTE — Telephone Encounter (Signed)
She is going to stop her ASA 81mg  due to her Xarelto that she takes

## 2012-11-24 NOTE — Progress Notes (Addendum)
PROGRESS NOTE  Subjective:   [name] [name] is an 72 y.o. female.  Chief Complaint: Shortness of breath  HPI: Ms. [name] [name] is a 72 yo woman with PMH of hypertension, dyslipidemia, GERD, atrial fibrillation with multiple prior ablations and convergence procedure on chronic anticoagulation, parkinson's disease and recent pacemaker placement in September 2014 who comes in after being seen by PCP for shortness of breath and some pulmonary edema. On evaluation, Ms. Main is accompanied by her husband. She tells me she's had shortness of breath with activity for at least 1 month and really hasn't felt that week since early to mid summer. At that time she was able [place]/vertical[place] in approximately 1 hour. However, since then she's noticeably short of breath with minimal walking and even starting to climb steps which she now avoids. She's previously had lasix before her ablations and convergence procedure but has never been treated for heart failure before. She notes bleeding from her mouth with xarelto at times but otherwise, no sick contacts, no fever/chills/nausea/vomiting/diarrhea. No syncope. No chest pain. We had a long discussion about coming into the hospital vs. Discharge home but she has an appointment with Dr. Smith Robert at [facility] on Friday and given elevated volume status on exam and pulmonary edema, she decided to get some IV diuresis and echocardiogram in AM.   She admits to eating more salt recently - potato chips, casseroles, .  She feels much better this am after getting some IV lasix.  Walking in the hallway without problems.   Objective:    Vital Signs:   Temp:  [98 F (36.7 C)-98.9 F (37.2 C)] 98 F (36.7 C) (10/22 0651) Pulse Rate:  [59-85] 80 (10/22 0651) Resp:  [13-21] 18 (10/22 0651) BP: (126-163)/(71-97) 132/71 mmHg (10/22 0651) SpO2:  [94 %-100 %] 95 % (10/22 0651) Weight:  [146 lb 9.7 oz (66.5 kg)-151 lb 7 oz (68.692  kg)] 146 lb 9.7 oz (66.5 kg) (10/22 0651)  Last BM Date: 11/23/12   24-hour weight change: Weight change:   Weight trends: Filed Weights   11/23/12 1739 11/23/12 2331 11/24/12 0651  Weight: 151 lb 7 oz (68.692 kg) 147 lb 11.3 oz (67 kg) 146 lb 9.7 oz (66.5 kg)    Intake/Output:  10/21 0701 - 10/22 0700 In: -  Out: 550 [Urine:550]      Physical Exam: BP 132/71  Pulse 80  Temp(Src) 98 F (36.7 C) (Oral)  Resp 18  Ht 5\' 2"  (1.575 m)  Wt 146 lb 9.7 oz (66.5 kg)  BMI 26.81 kg/m2  SpO2 95%  LMP 02/04/1996  General: Vital signs reviewed and noted.   Head: Normocephalic, atraumatic.  Eyes: conjunctivae/corneas clear.  EOM's intact.   Throat: normal  Neck:  NO JVD this am  Lungs:    decreased breath sounds in right base, otherwise clear  Heart:  RR, pacer site is OK.  No infection  Abdomen:  Soft, non-tender, non-distended    Extremities: No edema   Neurologic: A&O X3, CN II - XII are grossly intact. Has early parkinsons disease  Psych: Normal     Labs: BMET:  Recent Labs  11/24/12 0055 11/24/12 0643  NA 135 138  K 3.9 4.0  CL 100 100  CO2 21 28  GLUCOSE 132* 97  BUN 12 14  CREATININE 0.67 0.74  CALCIUM 9.2 9.3  MG 2.0  --     Liver function tests:  Recent  Labs  11/24/12 0055  AST 32  ALT 21  ALKPHOS 73  BILITOT 1.4*  PROT 7.1  ALBUMIN 3.9   No results found for this basename: LIPASE, AMYLASE,  in the last 72 hours  CBC:  Recent Labs  11/23/12 1751  WBC 5.9  HGB 11.0*  HCT 32.8*  MCV 87.2  PLT 194    Cardiac Enzymes:  Recent Labs  11/24/12 0055 11/24/12 0643  TROPONINI <0.30 <0.30    Coagulation Studies:  Recent Labs  11/23/12 1751  LABPROT 15.8*  INR 1.29    Other: No components found with this basename: POCBNP,  No results found for this basename: DDIMER,  in the last 72 hours No results found for this basename: HGBA1C,  in the last 72 hours No results found for this basename: CHOL, HDL, LDLCALC, TRIG, CHOLHDL,   in the last 72 hours No results found for this basename: TSH, T4TOTAL, FREET3, T3FREE, THYROIDAB,  in the last 72 hours No results found for this basename: VITAMINB12, FOLATE, FERRITIN, TIBC, IRON, RETICCTPCT,  in the last 72 hours   Other results:  Tele:   NSR  Medications:    Infusions:    Scheduled Medications: . aspirin EC  81 mg Oral Daily  . docusate sodium  100 mg Oral BID  . dofetilide  250 mcg Oral BID  . ezetimibe  10 mg Oral QODAY  . furosemide  20 mg Intravenous BID  . losartan  25 mg Oral Daily  . metoprolol  25 mg Oral BID  . rasagiline  1 mg Oral Daily  . Rivaroxaban  20 mg Oral QHS  . rosuvastatin  2.5 mg Oral QODAY  . sodium chloride  3 mL Intravenous Q12H  . [START ON 11/28/2012] Vitamin D (Ergocalciferol)  50,000 Units Oral Q7 days    Assessment/ Plan:     1.  Acute diastolic CHF:  i suspect this is from her excessive salt intake recently.  She admits to eating lots of salt over the past month since her procedure.   She feels much better after 1 dose of lasix.   Will get echo today.   Start Lasix 20 mg a day, Kdur 10 meq a day She should be able to go home today.  We will have her come to office in ~ 1 week for BMP Continue other meds.    2.  A-fib - in NSR currently    3.  Hypertension- stable    4.  Right pleural effusion.  Moderate in size.  This may improve with diuresis but will need follow up if it persists.    Will follow up with Dr. Waynard Edwards.    Disposition: DC to home today.  Length of Stay: 1  Vesta Mixer, Montez Hageman., MD, [facility] 11/24/2012, 7:52 AM Office [phone] Pager [phone]

## 2012-11-24 NOTE — Progress Notes (Signed)
  Echocardiogram 2D Echocardiogram has been performed by Dewitt Hoes.  [name] [name] FRANCES 11/24/2012, 12:11 PM

## 2012-11-24 NOTE — Telephone Encounter (Signed)
New problem    Pt was released form the hospital today and has some questions about her meds.  Please give pt a call.   Thanks!

## 2012-11-24 NOTE — Discharge Summary (Signed)
See my note from earlier today.  Pt is stable and will be discharged today.   Vesta Mixer, Montez Hageman., MD, [facility] 11/24/2012, 7:09 PM Office - [phone] Pager [phone]

## 2012-11-24 NOTE — Discharge Summary (Signed)
Discharge Summary   Patient ID: Yan Okray MRN: [id number], DOB/AGE: 70/24/1942 70 y.o. Admit date: 11/23/2012 D/C date:     11/24/2012  Primary Cardiologist: Dr. Elease Hashimoto  Primary Discharge Diagnoses:  Acute on chronic diastolic heart failure  Secondary Discharge Diagnoses:  Hypertension Hyperlipidemia Paroxysmal Atrial Fibrillation Chronic Xarelto anticoagulation  Moderate Right Pleural Effusion GERD Parkinson's disease    Diagnostic Studies/Procedures   Dg Chest 2 View- 11/23/12 IMPRESSION: Congestive heart failure with interstitial edema and a small right pleural effusion.    2D Echo 11/24/12 - Left ventricle: The cavity size was normal. Wall thickness was increased in a pattern of mild LVH. The estimated ejection fraction was 60%. Wall motion was normal; there were no regional wall motion abnormalities. - Right ventricle: The cavity size was normal. Pacer wire or catheter noted in right ventricle. Systolic function was normal. - Pericardium, extracardiac: There was no pericardial effusion.   Hospital Course: Yelitza Reach is a 72 y.o. female with a history of hypertension, hyperlipidemia, paroxysmal A fib (chronic anticoagulation with Xarelto), rhythm controlled with Tikosyn, s/p multiple prior ablations and convergence procedure, recent pacemaker placement (10/2012), GERD and parkinson's disease who presented to the ED on 11/23/12 with acute on chronic diastolic heart failure.  She was sent to the ED from PCPs office for SOB and pulmonary edema on CXR. Patient admitted to worsening SOB with minimal exertion x 1 month and increased salt intake since her pacemaker procedure 5 weeks ago.  Patient exhibited JVD to just under the mandible, bibasilar rales at bases, and lower extremity edema. BNP was mildly elevated at 622.3. Given these findings, she elected to be observed overnight at [facility] where she received IV diuresis and echocardiogram. Echo showed mild LVH  with EF: 60%. Patient was diuresed with 20mg  IV Lasix. Her net output was -840 and 5lb weight reduction (151lbs on 11/23/12 and 146 lbs 11/24/12). She is feeling much better and is ambulatory. Patient was evaluated by Dr. Elease Hashimoto today and deemed ready for discharge.  Patient will be discharged on the medication regimen outlined below. A follow up appointment has been made and CHF education was provided.    Discharge Vitals: Blood pressure 128/68, pulse 63, temperature 98.8 F (37.1 C), temperature source Oral, resp. rate 17, height 5\' 2"  (1.575 m), weight 146 lb 9.7 oz (66.5 kg), last menstrual period 02/04/1996, SpO2 96.00%.   Labs: Lab Results  Component Value Date   WBC 5.9 11/23/2012   HGB 11.0* 11/23/2012   HCT 32.8* 11/23/2012   MCV 87.2 11/23/2012   PLT 194 11/23/2012     Recent Labs Lab 11/24/12 0055 11/24/12 0643  NA 135 138  K 3.9 4.0  CL 100 100  CO2 21 28  BUN 12 14  CREATININE 0.67 0.74  CALCIUM 9.2 9.3  PROT 7.1  --   BILITOT 1.4*  --   ALKPHOS 73  --   ALT 21  --   AST 32  --   GLUCOSE 132* 97    Recent Labs  11/24/12 0055 11/24/12 0643  TROPONINI <0.30 <0.30   Lab Results  Component Value Date   CHOL  Value: 171        ATP III CLASSIFICATION:  <200     mg/dL   Desirable  409-811  mg/dL   Borderline High  >=914    mg/dL   High 7/82/9562   HDL 59 05/21/2007   LDLCALC  Value: 102        Total Cholesterol/HDL:CHD  Risk Coronary Heart Disease Risk Table                     Men   Women  1/2 Average Risk   3.4   3.3* 05/21/2007   TRIG 51 05/21/2007   No results found for this basename: DDIMER     Discharge Medications     Medication List    TAKE these medications       aspirin 81 MG EC tablet  Take 1 tablet (81 mg total) by mouth daily.     B COMPLEX PO  Take 1 tablet by mouth daily.     CALCIUM PO  Take 1 tablet by mouth daily.     CO Q 10 PO  Take 1 tablet by mouth daily.     COLACE PO  Take 1 capsule by mouth 2 (two) times daily.      dofetilide 250 MCG capsule  Commonly known as:  TIKOSYN  Take 250 mcg by mouth 2 (two) times daily.     FIBERCON PO  Take 1 tablet by mouth daily.     FISH OIL PO  Take 1 capsule by mouth daily.     furosemide 20 MG tablet  Commonly known as:  LASIX  Take 1 tablet (20 mg total) by mouth daily.     losartan 25 MG tablet  Commonly known as:  COZAAR  Take 25 mg by mouth daily.     LUTEIN PO  Take 1 tablet by mouth daily.     MAGNESIUM PO  Take 1 tablet by mouth daily.     metoprolol tartrate 25 MG tablet  Commonly known as:  LOPRESSOR  Take 1 tablet (25 mg total) by mouth 2 (two) times daily.     MULTIVITAMIN PO  Take 1 tablet by mouth daily.     potassium chloride 10 MEQ tablet  Commonly known as:  K-DUR,KLOR-CON  Take 1 tablet (10 mEq total) by mouth daily.     Rivaroxaban 20 MG Tabs tablet  Commonly known as:  XARELTO  Take 20 mg by mouth daily.     rosuvastatin 5 MG tablet  Commonly known as:  CRESTOR  Take 2.5 mg by mouth every other day.     valACYclovir 500 MG tablet  Commonly known as:  VALTREX  Take 500 mg by mouth daily as needed.     Vitamin D (Ergocalciferol) 50000 UNITS Caps capsule  Commonly known as:  DRISDOL  Take 50,000 Units by mouth every 7 (seven) days.     ZETIA 10 MG tablet  Generic drug:  ezetimibe  Take 10 mg by mouth every other day. Taking the same day as the crestor      ASK your doctor about these medications       rasagiline 1 MG Tabs tablet  Commonly known as:  AZILECT  Take 1 tablet (1 mg total) by mouth daily.        Disposition   The patient will be discharged in stable condition to home. Discharge Orders   Future Appointments Provider Department Dept Phone   12/01/2012 12:10 PM [facility] [place] Office [phone]   12/01/2012 3:20 PM Beatrice Lecher, PA-C [facility] [place] Office [phone]   02/22/2013 2:00 PM Dorene Sorrow [facility] [place] [phone]   05/24/2013  2:00 PM Annamaria Boots, MD [facility] [phone]   Future Orders Complete By Expires   Diet -  low sodium heart healthy  As directed    Increase activity slowly  As directed      Follow-up Information   Follow up with Tereso Newcomer, PA-C On 12/01/2012. (3:20 pm- we will also be checking lab work)    Specialty:  Publishing rights manager information:   [address] [place] [address] [phone]       Follow up with Ezequiel Kayser, MD In 1 week. (for general post hospital follow up)    Specialty:  Internal Medicine   Contact information:   [address] [place] [address] [phone]         Duration of Discharge Encounter: Greater than 30 minutes including physician and PA time.  Signed, Thereasa Parkin PA-C

## 2012-11-24 NOTE — Progress Notes (Signed)
Patient evaluated for community based chronic disease management services with [facility] Care Management Program as a benefit of patient's [employer]. Spoke with patient and son at bedside to explain [facility] Care Management services.  Patient would like to discuss services with her family and may engage post discharge.  She has struggled with diet compliance and fluid volume management.  Left contact information and [facility] literature at bedside. Made Inpatient Case Manager aware that [facility] Care Management following. Of note, [facility] Care Management services does not replace or interfere with any services that are arranged by inpatient case management or social work.  For additional questions or referrals please contact Anibal Henderson BSN RN [facility] Liaison at [phone].

## 2012-11-25 ENCOUNTER — Telehealth: Payer: Self-pay | Admitting: *Deleted

## 2012-11-25 NOTE — Telephone Encounter (Signed)
Left message for patient to call back  

## 2012-11-25 NOTE — Telephone Encounter (Signed)
Chart reviewed/ pt has bmet and ov already scheduled.

## 2012-11-25 NOTE — ED Provider Notes (Signed)
I saw and evaluated the patient, reviewed the resident's note and I agree with the findings and plan.  EKG Interpretation     Ventricular Rate:  86 PR Interval:    QRS Duration: 68 QT Interval:  384 QTC Calculation: 459 R Axis:   110 Text Interpretation:  Normal sinus rhythm Right axis deviation with 1st degree A-V block Low voltage QRS Non-specific ST-t changes No significant change since last tracing            72yF with dyspnea. Possible mild HF. Discussed with cardiology.   Raeford Razor, MD 11/25/12 1426

## 2012-11-25 NOTE — Telephone Encounter (Signed)
Jodette, Please call Ms. Nolet this afternoon and arrange BMP in about 1 week. We are starting lasix and kdur. She will be goin g home from hospital today. Per Dr Elease Hashimoto.

## 2012-11-26 NOTE — Telephone Encounter (Signed)
TCM call no answer.LMTC. 

## 2012-11-29 ENCOUNTER — Encounter: Payer: Self-pay | Admitting: Cardiovascular Disease

## 2012-11-29 DIAGNOSIS — I509 Heart failure, unspecified: Secondary | ICD-10-CM | POA: Diagnosis not present

## 2012-11-29 NOTE — Telephone Encounter (Signed)
Patient contacted regarding discharge from [facility]  on 11/24/12.  Patient understands to follow up with provider Tereso Newcomer 12/01/12 Patient understands discharge instructions? yes Patient understands medications and regiment? yes Patient understands to bring all medications to this visit? yes

## 2012-11-29 NOTE — Telephone Encounter (Signed)
LMTCB

## 2012-12-01 ENCOUNTER — Other Ambulatory Visit: Payer: Medicare Other

## 2012-12-01 ENCOUNTER — Encounter: Payer: Self-pay | Admitting: Physician Assistant

## 2012-12-01 ENCOUNTER — Ambulatory Visit (INDEPENDENT_AMBULATORY_CARE_PROVIDER_SITE_OTHER): Payer: Medicare Other | Admitting: Physician Assistant

## 2012-12-01 VITALS — BP 124/74 | HR 61 | Ht 62.0 in | Wt 145.0 lb

## 2012-12-01 DIAGNOSIS — I10 Essential (primary) hypertension: Secondary | ICD-10-CM | POA: Diagnosis not present

## 2012-12-01 DIAGNOSIS — E785 Hyperlipidemia, unspecified: Secondary | ICD-10-CM

## 2012-12-01 DIAGNOSIS — I5032 Chronic diastolic (congestive) heart failure: Secondary | ICD-10-CM | POA: Diagnosis not present

## 2012-12-01 DIAGNOSIS — I4891 Unspecified atrial fibrillation: Secondary | ICD-10-CM

## 2012-12-01 NOTE — Progress Notes (Signed)
[address] [place], [place]  [address] Phone: [phone] Fax:  [phone]  Date:  12/01/2012   ID:  [name] [name], DOB [birth year], MRN [id number]  PCP:  [name] Kayser, MD  Cardiologist:  Dr. Delane [name]   Electrophysiologist:  Dr. Hillis [name]    History of Present Illness: [name] [name] is a 72 y.o. female who returns for follow up after a recent admission to the hospital.   She has a history of HTN, HL, GERD, atrial fibrillation, rhythm controlled on Tikosyn therapy, status post multiple prior ablations and convergence procedure (at [facility]), status post pacemaker implantation, Parkinson's disease. LHC (4/14): Normal coronary arteries.  She was admitted 10/21-10/22 with acute on chronic diastolic CHF. She diuresed well with IV Lasix.  Echocardiogram (11/24/12): Mild LVH, EF 60%.  CXR did demonstrate mild to moderate R pleural effusion.    Doing well since discharge from the hospital. Breathing is improved. She is NYHA class II. She denies chest pain, orthopnea, PND or edema.  Labs (10/14):  K 4.6, creatinine 0.8, Mg 1.9  Wt Readings from Last 3 Encounters:  12/01/12 145 lb (65.772 kg)  11/24/12 146 lb 9.7 oz (66.5 kg)  10/26/12 146 lb 11.2 oz (66.543 kg)     Past Medical History  Diagnosis Date  . Hypertension   . Hyperlipidemia     a. Hx of leg weakness while on statin.  . Leg fracture Dec 21,2011  . Hip fracture 11-08-2008  . Chronic anticoagulation     on Xarelto  . High risk medication use     on Flecainide  . GERD (gastroesophageal reflux disease)   . Parkinson's disease 2011  . Arthritis     lt thumb  . Osteoporosis 07/2007  . PAF (paroxysmal atrial fibrillation)     s/p atrial fib ablation 11-11-11.  restarted on Toprol & Flecainide  . Chronic diastolic CHF (congestive heart failure)     Echocardiogram (11/24/12): Mild LVH, EF 60%.    Current Outpatient Prescriptions  Medication Sig Dispense Refill  . B Complex Vitamins (B COMPLEX PO) Take 1  tablet by mouth daily.       [name] CALCIUM PO Take 1 tablet by mouth daily.       . Calcium Polycarbophil (FIBERCON PO) Take 1 tablet by mouth daily.       . Coenzyme Q10 (CO Q 10 PO) Take 1 tablet by mouth daily.       Tery Sanfilippo Sodium (COLACE PO) Take 1 capsule by mouth 2 (two) times daily.       [name] dofetilide (TIKOSYN) 250 MCG capsule Take 250 mcg by mouth 2 (two) times daily.      [name] ezetimibe (ZETIA) 10 MG tablet Take 10 mg by mouth every other day. Taking the same day as the crestor      . furosemide (LASIX) 20 MG tablet Take 1 tablet (20 mg total) by mouth daily.  30 tablet  3  . losartan (COZAAR) 25 MG tablet Take 25 mg by mouth daily.        . LUTEIN PO Take 1 tablet by mouth daily.      [name] MAGNESIUM PO Take 1 tablet by mouth daily.       . metoprolol tartrate (LOPRESSOR) 25 MG tablet Take 1 tablet (25 mg total) by mouth 2 (two) times daily.  60 tablet  3  . Multiple Vitamin (MULTIVITAMIN PO) Take 1 tablet by mouth daily.       [name]  NEXIUM 40 MG capsule Take 1 capsule by mouth daily.      . Omega-3 Fatty Acids (FISH OIL PO) Take 1 capsule by mouth daily.       . potassium chloride (K-DUR,KLOR-CON) 10 MEQ tablet Take 1 tablet (10 mEq total) by mouth daily.  30 tablet  3  . rasagiline (AZILECT) 1 MG TABS tablet Take 1 tablet (1 mg total) by mouth daily.  90 tablet  3  . Rivaroxaban (XARELTO) 20 MG TABS Take 20 mg by mouth daily.      . rosuvastatin (CRESTOR) 5 MG tablet Take 2.5 mg by mouth every other day.      [name] TEMAZEPAM PO Take by mouth as needed.      . valACYclovir (VALTREX) 500 MG tablet Take 500 mg by mouth daily as needed.      . Vitamin D, Ergocalciferol, (DRISDOL) 50000 UNITS CAPS Take 50,000 Units by mouth every 7 (seven) days.        No current facility-administered medications for this visit.    Allergies:    Allergies  Allergen Reactions  . Statins     Leg weakness - but tolerating low dose Crestor every other day    Social History:  The patient  reports that she has  never smoked. She does not have any smokeless tobacco history on file. She reports that she drinks about 7.2 ounces of alcohol per week. She reports that she does not use illicit drugs.   Family History:  The patient's family history includes Alzheimer's disease in her mother; Heart failure in her father.   ROS:  Please see the history of present illness.   She has occasional epistaxis and oral bleeding. She denies melena, hematochezia, hematuria.   All other systems reviewed and negative.   PHYSICAL EXAM: VS:  BP 124/74  Pulse 61  Ht 5\' 2"  (1.575 m)  Wt 145 lb (65.772 kg)  BMI 26.51 kg/m2  LMP 02/04/1996 Well nourished, well developed, in no acute distress HEENT: normal Neck: no JVD Cardiac:  normal S1, S2; RRR; no murmur Lungs:  clear to auscultation bilaterally, no wheezing, rhonchi or rales Abd: soft, nontender, no hepatomegaly Ext: no edema Skin: warm and dry Neuro:  CNs 2-12 intact, no focal abnormalities noted  EKG:  Atrial paced, HR 61     ASSESSMENT AND PLAN:  1. Chronic Diastolic CHF: Volume stable. Continue current dose of Lasix. Recent potassium and creatinine stable (checked at PCPs office earlier this week). 2. Atrial Fibrillation: Maintaining sinus rhythm. Continue Tikosyn and Xarelto. Followup with Dr. Hurman [name] at [facility]. 3. Hypertension: Controlled. 4. Hyperlipidemia: Continue statin. 5. Disposition: Follow up with Dr. Elease [name] 2-3 months.  Signed, Tereso Newcomer, PA-C  12/01/2012 4:19 PM

## 2012-12-01 NOTE — Patient Instructions (Signed)
Schedule follow up with Dr. Delane Ginger in 2-3 months.

## 2012-12-10 ENCOUNTER — Other Ambulatory Visit: Payer: Self-pay

## 2012-12-10 DIAGNOSIS — Z1231 Encounter for screening mammogram for malignant neoplasm of breast: Secondary | ICD-10-CM

## 2012-12-23 DIAGNOSIS — I10 Essential (primary) hypertension: Secondary | ICD-10-CM | POA: Diagnosis not present

## 2012-12-23 DIAGNOSIS — G20 Parkinson's disease: Secondary | ICD-10-CM | POA: Diagnosis not present

## 2012-12-23 DIAGNOSIS — I4891 Unspecified atrial fibrillation: Secondary | ICD-10-CM | POA: Diagnosis not present

## 2012-12-23 DIAGNOSIS — I509 Heart failure, unspecified: Secondary | ICD-10-CM | POA: Diagnosis not present

## 2012-12-23 DIAGNOSIS — Z6826 Body mass index (BMI) 26.0-26.9, adult: Secondary | ICD-10-CM | POA: Diagnosis not present

## 2013-01-06 ENCOUNTER — Ambulatory Visit (INDEPENDENT_AMBULATORY_CARE_PROVIDER_SITE_OTHER): Payer: Medicare Other | Admitting: Podiatry

## 2013-01-06 ENCOUNTER — Encounter: Payer: Self-pay | Admitting: Podiatry

## 2013-01-06 VITALS — BP 117/69 | HR 82 | Resp 16

## 2013-01-06 DIAGNOSIS — L84 Corns and callosities: Secondary | ICD-10-CM

## 2013-01-06 DIAGNOSIS — M898X9 Other specified disorders of bone, unspecified site: Secondary | ICD-10-CM | POA: Diagnosis not present

## 2013-01-06 NOTE — Patient Instructions (Signed)

## 2013-01-06 NOTE — Progress Notes (Signed)
Subjective:     Patient ID: [name] [name], female   DOB: 1940-06-10, 72 y.o.   MRN: [id number]  HPI patient states my feet still hurt me at times and this callus on my right foot bothers me and I want spur taking off my little toe in January   Review of Systems     Objective:   Physical Exam Neurovascular status intact with no health history changes noted. Hyperostosis of the fifth toe right medial side it's painful when pressed in keratotic lesion sub-1 right    Assessment:     Exostosis fifth toe right and keratotic lesion sub-first metatarsal right    Plan:     Discussed possible modifications of orthotic and today debrided plantar lesion and discussed surgery of the right fifth toe in January. Reappoint in 5 weeks to reschedule

## 2013-01-11 DIAGNOSIS — H5200 Hypermetropia, unspecified eye: Secondary | ICD-10-CM | POA: Diagnosis not present

## 2013-01-11 DIAGNOSIS — H40019 Open angle with borderline findings, low risk, unspecified eye: Secondary | ICD-10-CM | POA: Diagnosis not present

## 2013-01-12 ENCOUNTER — Other Ambulatory Visit: Payer: Self-pay | Admitting: Nurse Practitioner

## 2013-01-20 ENCOUNTER — Ambulatory Visit: Payer: Medicare Other

## 2013-01-20 ENCOUNTER — Ambulatory Visit
Admission: RE | Admit: 2013-01-20 | Discharge: 2013-01-20 | Disposition: A | Discharge status: Home / Self Care | Payer: Medicare Other | Source: Ambulatory Visit

## 2013-01-20 DIAGNOSIS — Z1231 Encounter for screening mammogram for malignant neoplasm of breast: Secondary | ICD-10-CM

## 2013-01-21 DIAGNOSIS — Z950 Presence of cardiac pacemaker: Secondary | ICD-10-CM | POA: Diagnosis not present

## 2013-01-21 DIAGNOSIS — I4891 Unspecified atrial fibrillation: Secondary | ICD-10-CM | POA: Diagnosis not present

## 2013-02-09 DIAGNOSIS — M810 Age-related osteoporosis without current pathological fracture: Secondary | ICD-10-CM | POA: Diagnosis not present

## 2013-02-16 DIAGNOSIS — D239 Other benign neoplasm of skin, unspecified: Secondary | ICD-10-CM | POA: Diagnosis not present

## 2013-02-16 DIAGNOSIS — L821 Other seborrheic keratosis: Secondary | ICD-10-CM | POA: Diagnosis not present

## 2013-02-16 DIAGNOSIS — L570 Actinic keratosis: Secondary | ICD-10-CM | POA: Diagnosis not present

## 2013-02-16 DIAGNOSIS — L253 Unspecified contact dermatitis due to other chemical products: Secondary | ICD-10-CM | POA: Diagnosis not present

## 2013-02-16 DIAGNOSIS — D2370 Other benign neoplasm of skin of unspecified lower limb, including hip: Secondary | ICD-10-CM | POA: Diagnosis not present

## 2013-02-22 ENCOUNTER — Ambulatory Visit (INDEPENDENT_AMBULATORY_CARE_PROVIDER_SITE_OTHER): Payer: Medicare Other | Admitting: Neurology

## 2013-02-22 ENCOUNTER — Encounter: Payer: Self-pay | Admitting: Neurology

## 2013-02-22 VITALS — BP 114/80 | HR 92 | Resp 16 | Ht 62.0 in | Wt 150.4 lb

## 2013-02-22 DIAGNOSIS — G20 Parkinson's disease: Secondary | ICD-10-CM

## 2013-02-22 MED ORDER — CARBIDOPA-LEVODOPA 25-100 MG PO TABS: 1.0000 | ORAL_TABLET | Freq: Three times a day (TID) | ORAL | Status: DC

## 2013-02-22 NOTE — Patient Instructions (Signed)
1.  Start carbidopa/levodopa 25/100 as follows:  1/2 tab three times a day before meals x 1 wk, then 1/2 in am & noon & 1 in evening for a week, then 1/2 in am &1 at noon &one in evening for a week, then 1 tablet three times a day before meals  

## 2013-02-22 NOTE — Progress Notes (Signed)
[name] [name] was seen today in the movement disorders clinic for neurologic consultation at the request of [name],[name], MD.  The consultation is for the evaluation of PD.  She has previously seen [name] and [name].  I have some of [name] notes.  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  She had a normal MRI brain at that time according to notes from Dr. love.  In 2011, her diagnosis was changed to Parkinson's disease and Azilect was added.  She had a second opinion at [facility] on 07/25/2009 and [name] also thought that she had Parkinson's disease.  In January, 2014 [name] added Artane, presumably for tremor.  She does think that it has helped the tremor.  02/22/13 update:  The pt is f/u today re: PD.  After our discussion last visit, she decided to stop the artane and states that while she noted a little increase in tremor, it hasn't been bad.  I had wanted to start her on carbidopa/levodopa 25/100 but she wanted to hold on that last vist.  Balance is good but she has felt more slow and stiff.  No falls.  No hallucinations.  No n/v.  No near syncope.  Not exercising faithfully.  She was hospitalized since last visit with CHF. I reviewed her MRI of the brain from April, 2009.  It was unremarkable.  Formal report is below:  Clinical Data: Syncopal episode. Confusion.  MRI HEAD WITHOUT CONTRAST  MRA HEAD WITHOUT CONTRAST  Technique: Multiplanar, multiecho pulse sequences of the brain and  surrounding structures were obtained according to standard protocol  with intravenous contrast. Angiographic images of the head were  obtained using MRA technique without contrast.  Comparison: No comparison MR. Prior CT 05/20/2007.  MRI HEAD  Findings: No acute infarct. No intracranial hemorrhage. No  hydrocephalous. No intracranial mass lesion detected on this  unenhanced exam. Major intracranial vascular structures are  patent. The ectatic vertebral arteries cause slight indentation  upon the  medulla. Very minimal partial opacification mastoid air  cells.  IMPRESSION:  No acute infarct.  MRA HEAD  Findings: Ectasia of the distal vertical segment of the carotid  arteries bilaterally. Mild ectasia of the cavernous segment of the  internal carotid arteries bilaterally. Minimal narrowing proximal  A1 segment of the anterior cerebral arteries bilaterally.  Ectatic vertebral arteries and basilar artery. The AICAs are not  seen on either side. Remainder of major intracranial vascular  structures are patent. Minimal branch vessel irregularity. No  aneurysm visualized.  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  PREVIOUS MEDICATIONS: Azilect and artane  ALLERGIES:   Allergies  Allergen Reactions  . Statins     Leg weakness - but tolerating low dose Crestor every other day    CURRENT MEDICATIONS:  Current Outpatient Prescriptions on File Prior to Visit  Medication Sig Dispense Refill  . B Complex Vitamins (B COMPLEX PO) Take 1 tablet by mouth daily.       [name] CALCIUM PO Take 1 tablet by mouth daily.       . Calcium Polycarbophil (FIBERCON PO) Take 1 tablet by mouth daily.       . Coenzyme Q10 (CO Q 10 PO) Take 1 tablet by mouth daily.       Mariane Baumgarten Sodium (COLACE PO) Take 1 capsule by mouth 2 (two) times daily.       [name] dofetilide (TIKOSYN) 250 MCG capsule Take 250 mcg by mouth  2 (two) times daily.      [name] ezetimibe (ZETIA) 10 MG tablet Take 10 mg by mouth every other day. Taking the same day as the crestor      . furosemide (LASIX) 20 MG tablet Take 1 tablet (20 mg total) by mouth daily.  30 tablet  3  . losartan (COZAAR) 25 MG tablet Take 25 mg by mouth daily.        . LUTEIN PO Take 1 tablet by mouth daily.      [name] MAGNESIUM PO Take 1 tablet by mouth daily.       . metoprolol tartrate (LOPRESSOR) 25 MG tablet Take 1 tablet (25 mg total) by mouth 2 (two) times daily.  60 tablet  3  . Multiple Vitamin (MULTIVITAMIN PO) Take  1 tablet by mouth daily.       [name] NEXIUM 40 MG capsule Take 1 capsule by mouth daily.      . Omega-3 Fatty Acids (FISH OIL PO) Take 1 capsule by mouth daily.       . potassium chloride (K-DUR,KLOR-CON) 10 MEQ tablet Take 1 tablet (10 mEq total) by mouth daily.  30 tablet  3  . rasagiline (AZILECT) 1 MG TABS tablet Take 1 tablet (1 mg total) by mouth daily.  90 tablet  3  . Rivaroxaban (XARELTO) 20 MG TABS Take 20 mg by mouth daily.      . rosuvastatin (CRESTOR) 5 MG tablet Take 2.5 mg by mouth every other day.      [name] TEMAZEPAM PO Take by mouth as needed.      . valACYclovir (VALTREX) 500 MG tablet Take 500 mg by mouth daily as needed.      . Vitamin D, Ergocalciferol, (DRISDOL) 50000 UNITS CAPS Take 50,000 Units by mouth every 7 (seven) days.        No current facility-administered medications on file prior to visit.    PAST MEDICAL HISTORY:   Past Medical History  Diagnosis Date  . Hypertension   . Hyperlipidemia     a. Hx of leg weakness while on statin.  . Leg fracture Dec 21,2011  . Hip fracture 11-08-2008  . Chronic anticoagulation     on Xarelto  . High risk medication use     on Flecainide  . GERD (gastroesophageal reflux disease)   . Parkinson's disease 2011  . Arthritis     lt thumb  . Osteoporosis 07/2007  . PAF (paroxysmal atrial fibrillation)     s/p atrial fib ablation 11-11-11.  restarted on Toprol & Flecainide  . Chronic diastolic CHF (congestive heart failure)     Echocardiogram (11/24/12): Mild LVH, EF 60%.    PAST SURGICAL HISTORY:   Past Surgical History  Procedure Laterality Date  . Knee surgery Left     2  . US echocardiography  03-10-2008    Est EF 55-60%  . Cardiovascular stress test  06-06-2008    EF 73%  . Wrist surgery    . Cardioversion  09/09/2011    Procedure: CARDIOVERSION;  Surgeon: Darlin Coco, MD;  Location: [facility] ENDOSCOPY;  Service: Cardiovascular;  Laterality: N/A;  to be done by dr. Mare Ferrari  . Tee without cardioversion  11/10/2011     Procedure: TRANSESOPHAGEAL ECHOCARDIOGRAM (TEE);  Surgeon: Thayer Headings, MD;  Location: [place];  Service: Cardiovascular;  Laterality: N/A;  . Cardioversion  02/18/2012    Procedure: CARDIOVERSION;  Surgeon: Thayer Headings, MD;  Location: [place];  Service: Cardiovascular;  Laterality:  N/A;  . Cholecystectomy  1995  . Tonsillectomy    . Breast surgery Left     benign cyst removed  . Femur fracture surgery    . Pacemaker placement  10/20/12    MRI compatible    SOCIAL HISTORY:   History   Social History  . Marital Status: Married    Spouse Name: N/A    Number of Children: N/A  . Years of Education: N/A   Occupational History  . Not on file.   Social History Main Topics  . Smoking status: Never Smoker   . Smokeless tobacco: Not on file  . Alcohol Use: 7.2 oz/week    12 Glasses of wine per week     Comment: wine 2-3 glasses of wine a day  . Drug Use: No  . Sexual Activity: Yes    Partners: Male    Birth Control/ Protection: Post-menopausal   Other Topics Concern  . Not on file   Social History Narrative   Lives in [place].  Retired Licensed conveyancer.    FAMILY HISTORY:   Family Status  Relation Status Death Age  . Mother Deceased 68    AD   . Father Deceased 12    Valve-rheumatic fever as child  . Child Alive     healthy  . Child Alive     healthy    ROS:  A complete 10 system review of systems was obtained and was unremarkable apart from what is mentioned above.  PHYSICAL EXAMINATION:    VITALS:   Filed Vitals:   02/22/13 1337  BP: 114/80  Pulse: 92  Resp: 16  Height: 5\' 2"  (1.575 m)  Weight: 150 lb 6 oz (68.21 kg)    GEN:  The patient appears stated age and is in NAD. HEENT:  Normocephalic, atraumatic.  The mucous membranes are moist. The superficial temporal arteries are without ropiness or tenderness. CV:  RRR.  There is a healing surgical incision with granulation tissue over the left chest.  There is swelling in the area as she just  had a permanent pacemaker placed a few days ago.  There is no erythema. Lungs:  CTAB Neck/HEME:  There are no carotid bruits bilaterally.  Neurological examination:  Orientation: The patient is alert and oriented x3. Fund of knowledge is appropriate.  Recent and remote memory are intact.  Attention and concentration are normal.    Able to name objects and repeat phrases.   Movement examination: Tone: There is mild increased tone bilaterally, R greater than L.  LE tone is normal.   Abnormal movements: There is a mild, intermittent resting tremor of the right upper and lower extremity.  When she ambulates, she has a tremor of both upper extremities.  The R is worse than the L.   Coordination:  There is decremation with RAM's bilaterally, R more than L. Gait and Station: The patient has no difficulty arising out of a deep-seated chair without the use of the hands. The patient's stride length is mildly decreased.  Tremor increases with ambulation.  The patient has a negative pull test.      ASSESSMENT/PLAN:  1.  Parkinsonism.  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  The patient has tremor, bradykinesia, rigidity and mild postural instability.  -We discussed the diagnosis as well as pathophysiology of the disease.  We discussed treatment options as well as prognostic indicators.  Patient education was provided.  -Greater than 50% of the 80  minute visit was spent in counseling answering questions and talking about what to expect now as well as in the future.  We talked about medication options as well as potential future surgical options.  We talked about safety in the home.  -She has d/c the artane, and I was glad to see that.  -For now, we decided to leave her on Azilect.  I am really not sure that he it is doing very much.  -We decided to start carbidopa/levodopa 25/100 as follows:  1/2 tab three times a day before meals x 1 wk, then 1/2 in  am & noon & 1 in evening for a week, then 1/2 in am &1 at noon &one in evening for a week, then 1 tablet three times a day before meals  -I talked to her about a support group that meets the third Tuesday after every month.  I talked to her about the Parkinson's program [facility] the [facility]. I gave her a flyer on our new PD exercise class, supported by a grant from the [employer].   2.  Follow up in the next few months, sooner should new neurologic issues arise.

## 2013-02-23 DIAGNOSIS — Z6827 Body mass index (BMI) 27.0-27.9, adult: Secondary | ICD-10-CM | POA: Diagnosis not present

## 2013-02-23 DIAGNOSIS — M810 Age-related osteoporosis without current pathological fracture: Secondary | ICD-10-CM | POA: Diagnosis not present

## 2013-02-23 DIAGNOSIS — G20 Parkinson's disease: Secondary | ICD-10-CM | POA: Diagnosis not present

## 2013-03-07 ENCOUNTER — Ambulatory Visit: Payer: Medicare Other | Admitting: Cardiovascular Disease

## 2013-03-08 DIAGNOSIS — M5137 Other intervertebral disc degeneration, lumbosacral region: Secondary | ICD-10-CM | POA: Diagnosis not present

## 2013-03-08 DIAGNOSIS — M999 Biomechanical lesion, unspecified: Secondary | ICD-10-CM | POA: Diagnosis not present

## 2013-03-08 DIAGNOSIS — M25559 Pain in unspecified hip: Secondary | ICD-10-CM | POA: Diagnosis not present

## 2013-03-17 ENCOUNTER — Other Ambulatory Visit: Payer: Self-pay

## 2013-03-17 DIAGNOSIS — M25559 Pain in unspecified hip: Secondary | ICD-10-CM | POA: Diagnosis not present

## 2013-03-17 DIAGNOSIS — M5137 Other intervertebral disc degeneration, lumbosacral region: Secondary | ICD-10-CM | POA: Diagnosis not present

## 2013-03-17 DIAGNOSIS — M999 Biomechanical lesion, unspecified: Secondary | ICD-10-CM | POA: Diagnosis not present

## 2013-03-17 MED ORDER — METOPROLOL TARTRATE 25 MG PO TABS: 25.0000 mg | ORAL_TABLET | Freq: Two times a day (BID) | ORAL | Status: DC

## 2013-03-21 DIAGNOSIS — M25559 Pain in unspecified hip: Secondary | ICD-10-CM | POA: Diagnosis not present

## 2013-03-21 DIAGNOSIS — M999 Biomechanical lesion, unspecified: Secondary | ICD-10-CM | POA: Diagnosis not present

## 2013-03-21 DIAGNOSIS — M5137 Other intervertebral disc degeneration, lumbosacral region: Secondary | ICD-10-CM | POA: Diagnosis not present

## 2013-03-24 DIAGNOSIS — M999 Biomechanical lesion, unspecified: Secondary | ICD-10-CM | POA: Diagnosis not present

## 2013-03-24 DIAGNOSIS — M5137 Other intervertebral disc degeneration, lumbosacral region: Secondary | ICD-10-CM | POA: Diagnosis not present

## 2013-03-24 DIAGNOSIS — M25559 Pain in unspecified hip: Secondary | ICD-10-CM | POA: Diagnosis not present

## 2013-03-28 DIAGNOSIS — M999 Biomechanical lesion, unspecified: Secondary | ICD-10-CM | POA: Diagnosis not present

## 2013-03-28 DIAGNOSIS — M5137 Other intervertebral disc degeneration, lumbosacral region: Secondary | ICD-10-CM | POA: Diagnosis not present

## 2013-03-28 DIAGNOSIS — M25559 Pain in unspecified hip: Secondary | ICD-10-CM | POA: Diagnosis not present

## 2013-03-30 DIAGNOSIS — M999 Biomechanical lesion, unspecified: Secondary | ICD-10-CM | POA: Diagnosis not present

## 2013-03-30 DIAGNOSIS — M25559 Pain in unspecified hip: Secondary | ICD-10-CM | POA: Diagnosis not present

## 2013-03-30 DIAGNOSIS — M5137 Other intervertebral disc degeneration, lumbosacral region: Secondary | ICD-10-CM | POA: Diagnosis not present

## 2013-03-31 ENCOUNTER — Ambulatory Visit: Payer: Medicare Other | Admitting: Cardiovascular Disease

## 2013-04-04 DIAGNOSIS — M25559 Pain in unspecified hip: Secondary | ICD-10-CM | POA: Diagnosis not present

## 2013-04-04 DIAGNOSIS — M5137 Other intervertebral disc degeneration, lumbosacral region: Secondary | ICD-10-CM | POA: Diagnosis not present

## 2013-04-04 DIAGNOSIS — IMO0002 Reserved for concepts with insufficient information to code with codable children: Secondary | ICD-10-CM | POA: Diagnosis not present

## 2013-04-04 DIAGNOSIS — M999 Biomechanical lesion, unspecified: Secondary | ICD-10-CM | POA: Diagnosis not present

## 2013-04-05 DIAGNOSIS — M25559 Pain in unspecified hip: Secondary | ICD-10-CM | POA: Diagnosis not present

## 2013-04-05 DIAGNOSIS — IMO0002 Reserved for concepts with insufficient information to code with codable children: Secondary | ICD-10-CM | POA: Diagnosis not present

## 2013-04-05 DIAGNOSIS — M999 Biomechanical lesion, unspecified: Secondary | ICD-10-CM | POA: Diagnosis not present

## 2013-04-05 DIAGNOSIS — M5137 Other intervertebral disc degeneration, lumbosacral region: Secondary | ICD-10-CM | POA: Diagnosis not present

## 2013-04-06 DIAGNOSIS — I4891 Unspecified atrial fibrillation: Secondary | ICD-10-CM | POA: Diagnosis not present

## 2013-04-06 DIAGNOSIS — M810 Age-related osteoporosis without current pathological fracture: Secondary | ICD-10-CM | POA: Diagnosis not present

## 2013-04-06 DIAGNOSIS — M25559 Pain in unspecified hip: Secondary | ICD-10-CM | POA: Diagnosis not present

## 2013-04-06 DIAGNOSIS — M5137 Other intervertebral disc degeneration, lumbosacral region: Secondary | ICD-10-CM | POA: Diagnosis not present

## 2013-04-06 DIAGNOSIS — Z79899 Other long term (current) drug therapy: Secondary | ICD-10-CM | POA: Diagnosis not present

## 2013-04-06 DIAGNOSIS — G20 Parkinson's disease: Secondary | ICD-10-CM | POA: Diagnosis not present

## 2013-04-06 DIAGNOSIS — IMO0002 Reserved for concepts with insufficient information to code with codable children: Secondary | ICD-10-CM | POA: Diagnosis not present

## 2013-04-06 DIAGNOSIS — M999 Biomechanical lesion, unspecified: Secondary | ICD-10-CM | POA: Diagnosis not present

## 2013-04-06 DIAGNOSIS — I509 Heart failure, unspecified: Secondary | ICD-10-CM | POA: Diagnosis not present

## 2013-04-06 DIAGNOSIS — Z6826 Body mass index (BMI) 26.0-26.9, adult: Secondary | ICD-10-CM | POA: Diagnosis not present

## 2013-04-11 ENCOUNTER — Other Ambulatory Visit: Payer: Self-pay | Admitting: Cardiovascular Disease

## 2013-04-11 DIAGNOSIS — M25559 Pain in unspecified hip: Secondary | ICD-10-CM | POA: Diagnosis not present

## 2013-04-11 DIAGNOSIS — IMO0002 Reserved for concepts with insufficient information to code with codable children: Secondary | ICD-10-CM | POA: Diagnosis not present

## 2013-04-11 DIAGNOSIS — M999 Biomechanical lesion, unspecified: Secondary | ICD-10-CM | POA: Diagnosis not present

## 2013-04-11 DIAGNOSIS — M5137 Other intervertebral disc degeneration, lumbosacral region: Secondary | ICD-10-CM | POA: Diagnosis not present

## 2013-04-12 DIAGNOSIS — IMO0002 Reserved for concepts with insufficient information to code with codable children: Secondary | ICD-10-CM | POA: Diagnosis not present

## 2013-04-12 DIAGNOSIS — M5137 Other intervertebral disc degeneration, lumbosacral region: Secondary | ICD-10-CM | POA: Diagnosis not present

## 2013-04-12 DIAGNOSIS — M25559 Pain in unspecified hip: Secondary | ICD-10-CM | POA: Diagnosis not present

## 2013-04-12 DIAGNOSIS — M999 Biomechanical lesion, unspecified: Secondary | ICD-10-CM | POA: Diagnosis not present

## 2013-04-14 DIAGNOSIS — M25559 Pain in unspecified hip: Secondary | ICD-10-CM | POA: Diagnosis not present

## 2013-04-14 DIAGNOSIS — M999 Biomechanical lesion, unspecified: Secondary | ICD-10-CM | POA: Diagnosis not present

## 2013-04-14 DIAGNOSIS — IMO0002 Reserved for concepts with insufficient information to code with codable children: Secondary | ICD-10-CM | POA: Diagnosis not present

## 2013-04-14 DIAGNOSIS — M5137 Other intervertebral disc degeneration, lumbosacral region: Secondary | ICD-10-CM | POA: Diagnosis not present

## 2013-04-25 DIAGNOSIS — IMO0002 Reserved for concepts with insufficient information to code with codable children: Secondary | ICD-10-CM | POA: Diagnosis not present

## 2013-04-25 DIAGNOSIS — M5137 Other intervertebral disc degeneration, lumbosacral region: Secondary | ICD-10-CM | POA: Diagnosis not present

## 2013-04-25 DIAGNOSIS — I495 Sick sinus syndrome: Secondary | ICD-10-CM | POA: Diagnosis not present

## 2013-04-25 DIAGNOSIS — M999 Biomechanical lesion, unspecified: Secondary | ICD-10-CM | POA: Diagnosis not present

## 2013-04-25 DIAGNOSIS — Z45018 Encounter for adjustment and management of other part of cardiac pacemaker: Secondary | ICD-10-CM | POA: Diagnosis not present

## 2013-04-25 DIAGNOSIS — M25559 Pain in unspecified hip: Secondary | ICD-10-CM | POA: Diagnosis not present

## 2013-04-27 DIAGNOSIS — M5137 Other intervertebral disc degeneration, lumbosacral region: Secondary | ICD-10-CM | POA: Diagnosis not present

## 2013-04-27 DIAGNOSIS — M25559 Pain in unspecified hip: Secondary | ICD-10-CM | POA: Diagnosis not present

## 2013-04-27 DIAGNOSIS — M999 Biomechanical lesion, unspecified: Secondary | ICD-10-CM | POA: Diagnosis not present

## 2013-04-27 DIAGNOSIS — IMO0002 Reserved for concepts with insufficient information to code with codable children: Secondary | ICD-10-CM | POA: Diagnosis not present

## 2013-04-27 DIAGNOSIS — L570 Actinic keratosis: Secondary | ICD-10-CM | POA: Diagnosis not present

## 2013-04-27 DIAGNOSIS — L821 Other seborrheic keratosis: Secondary | ICD-10-CM | POA: Diagnosis not present

## 2013-04-28 DIAGNOSIS — Z45018 Encounter for adjustment and management of other part of cardiac pacemaker: Secondary | ICD-10-CM | POA: Diagnosis not present

## 2013-05-03 DIAGNOSIS — Z6827 Body mass index (BMI) 27.0-27.9, adult: Secondary | ICD-10-CM | POA: Diagnosis not present

## 2013-05-03 DIAGNOSIS — Z79899 Other long term (current) drug therapy: Secondary | ICD-10-CM | POA: Diagnosis not present

## 2013-05-03 DIAGNOSIS — M999 Biomechanical lesion, unspecified: Secondary | ICD-10-CM | POA: Diagnosis not present

## 2013-05-03 DIAGNOSIS — IMO0002 Reserved for concepts with insufficient information to code with codable children: Secondary | ICD-10-CM | POA: Diagnosis not present

## 2013-05-03 DIAGNOSIS — M5137 Other intervertebral disc degeneration, lumbosacral region: Secondary | ICD-10-CM | POA: Diagnosis not present

## 2013-05-03 DIAGNOSIS — I10 Essential (primary) hypertension: Secondary | ICD-10-CM | POA: Diagnosis not present

## 2013-05-03 DIAGNOSIS — M25559 Pain in unspecified hip: Secondary | ICD-10-CM | POA: Diagnosis not present

## 2013-05-04 ENCOUNTER — Ambulatory Visit: Payer: Medicare Other | Admitting: Cardiovascular Disease

## 2013-05-05 ENCOUNTER — Other Ambulatory Visit: Payer: Self-pay

## 2013-05-05 DIAGNOSIS — M25559 Pain in unspecified hip: Secondary | ICD-10-CM | POA: Diagnosis not present

## 2013-05-05 DIAGNOSIS — IMO0002 Reserved for concepts with insufficient information to code with codable children: Secondary | ICD-10-CM | POA: Diagnosis not present

## 2013-05-05 DIAGNOSIS — M999 Biomechanical lesion, unspecified: Secondary | ICD-10-CM | POA: Diagnosis not present

## 2013-05-05 DIAGNOSIS — M5137 Other intervertebral disc degeneration, lumbosacral region: Secondary | ICD-10-CM | POA: Diagnosis not present

## 2013-05-05 MED ORDER — FUROSEMIDE 20 MG PO TABS: 20.0000 mg | ORAL_TABLET | Freq: Every day | ORAL | Status: DC

## 2013-05-09 ENCOUNTER — Encounter: Payer: Self-pay | Admitting: Cardiovascular Disease

## 2013-05-09 ENCOUNTER — Ambulatory Visit (INDEPENDENT_AMBULATORY_CARE_PROVIDER_SITE_OTHER): Payer: Medicare Other | Admitting: Cardiovascular Disease

## 2013-05-09 VITALS — BP 104/80 | HR 80 | Ht 62.0 in | Wt 149.8 lb

## 2013-05-09 DIAGNOSIS — H2510 Age-related nuclear cataract, unspecified eye: Secondary | ICD-10-CM | POA: Diagnosis not present

## 2013-05-09 DIAGNOSIS — Z79899 Other long term (current) drug therapy: Secondary | ICD-10-CM

## 2013-05-09 DIAGNOSIS — I10 Essential (primary) hypertension: Secondary | ICD-10-CM | POA: Diagnosis not present

## 2013-05-09 DIAGNOSIS — I4891 Unspecified atrial fibrillation: Secondary | ICD-10-CM

## 2013-05-09 DIAGNOSIS — H3530 Unspecified macular degeneration: Secondary | ICD-10-CM | POA: Diagnosis not present

## 2013-05-09 DIAGNOSIS — H40019 Open angle with borderline findings, low risk, unspecified eye: Secondary | ICD-10-CM | POA: Diagnosis not present

## 2013-05-09 DIAGNOSIS — H401490 Capsular glaucoma with pseudoexfoliation of lens, unspecified eye, stage unspecified: Secondary | ICD-10-CM | POA: Diagnosis not present

## 2013-05-09 LAB — BASIC METABOLIC PANEL
BUN: 14 mg/dL (ref 6–23)
CHLORIDE: 98 meq/L (ref 96–112)
CO2: 32 mEq/L (ref 19–32)
Calcium: 9.5 mg/dL (ref 8.4–10.5)
Creatinine, Ser: 0.6 mg/dL (ref 0.4–1.2)
GFR: 100.9 mL/min (ref >60.00)
Glucose, Bld: 97 mg/dL (ref 70–99)
POTASSIUM: 4.6 meq/L (ref 3.5–5.1)
SODIUM: 137 meq/L (ref 135–145)

## 2013-05-09 LAB — MAGNESIUM: MAGNESIUM: 2 mg/dL (ref 1.5–2.5)

## 2013-05-09 NOTE — Assessment & Plan Note (Signed)
She is stable .  Atrial pacing on ECG.   Continue current dose of metoprolol an tikosyn.

## 2013-05-09 NOTE — Progress Notes (Signed)
[name] [name]  Date of Birth  1940/05/04   [facility] [address]    [place],   [address] [phone]  Fax  [phone]  Problems: 1. Atrial Fibrillation - s/p atrial fibrillation ablation 11/11/11, restarted on Toprol and flecainide 2. Hypertension 3. Hyperlipidemia 4. Parkinson's    History of Present Illness:  73 year old female with a history of atrial fibrillation. Her heart rate has been fairly well controlled on flecainide 100 mg twice a day . She has episodes of severe fatigue after she takes her medications.    Pt has had some chest pain with walking over the past several months. She had a normal stress Myoview study in May, 2010. She has had a few episodes of atrial ablation a typically occur when she's dehydrated. She is modestly better hydrated and this seems to make his episodes of atrial fibrillation.    She typically walks 50 minutes a day-4 times a week.  She's had 2 episodes of chest pressure while walking. The episode started with exercise and was relieved when she stops to rest.  She stopped her statin because of leg weakness.  She feels a bit better off the statin.  Dec. 19, 2013 She has had an atrial fibrillation ablation in October, 2013. She still is in atrial fibrillation. She is a little disappointed about this.  I told her that it was not uncommon to have some persistent atrial fibrillation for the first several months after an ablation. We will likely need to cardiovert her again. She was pleased to hear this.  She continues to have lots of problems related to her Parkinson's disease and the medicines used to treat Parkinson's. This seems to be her main limiting factor.  May 09, 2013:  And has a history of atrial fibrillation currently controlled on T-System therapy. She's had several prior AF ablations and also has had a convergence procedure at [facility] [place]. Has a pacemaker. She has a history of  Parkinson's disease. She's had a cardiac catheterization in April 2014 and has normal coronary arteries.  She was admitted to the hospital in October with acute on chronic diastolic congestive heart failure she was diuresed and felt quite a bit better.    Current Outpatient Prescriptions on File Prior to Visit  Medication Sig Dispense Refill  . B Complex Vitamins (B COMPLEX PO) Take 1 tablet by mouth daily.       [name] CALCIUM PO Take 1 tablet by mouth daily.       . Calcium Polycarbophil (FIBERCON PO) Take 1 tablet by mouth daily.       . carbidopa-levodopa (SINEMET) 25-100 MG per tablet Take 1 tablet by mouth 3 (three) times daily.  270 tablet  3  . Coenzyme Q10 (CO Q 10 PO) Take 1 tablet by mouth daily.       Mariane Baumgarten Sodium (COLACE PO) Take 1 capsule by mouth 2 (two) times daily.       [name] dofetilide (TIKOSYN) 250 MCG capsule Take 250 mcg by mouth 2 (two) times daily.      [name] ezetimibe (ZETIA) 10 MG tablet Take 10 mg by mouth every other day. Taking the same day as the crestor      . furosemide (LASIX) 20 MG tablet Take 1 tablet (20 mg total) by mouth daily.  30 tablet  1  . losartan (COZAAR) 25 MG tablet TAKE 1 TABLET EVERY DAY  90 tablet  0  . LUTEIN PO  Take 1 tablet by mouth daily.      [name] MAGNESIUM PO Take 1 tablet by mouth daily.       . metoprolol tartrate (LOPRESSOR) 25 MG tablet Take 1 tablet (25 mg total) by mouth 2 (two) times daily.  60 tablet  3  . Multiple Vitamin (MULTIVITAMIN PO) Take 1 tablet by mouth daily.       [name] NEXIUM 40 MG capsule Take 1 capsule by mouth daily.      . Omega-3 Fatty Acids (FISH OIL PO) Take 1 capsule by mouth daily.       . potassium chloride (K-DUR,KLOR-CON) 10 MEQ tablet Take 1 tablet (10 mEq total) by mouth daily.  30 tablet  3  . rasagiline (AZILECT) 1 MG TABS tablet Take 1 tablet (1 mg total) by mouth daily.  90 tablet  3  . Rivaroxaban (XARELTO) 20 MG TABS Take 20 mg by mouth daily.      . rosuvastatin (CRESTOR) 5 MG tablet Take 2.5 mg by mouth every  other day.      [name] TEMAZEPAM PO Take by mouth as needed.      . valACYclovir (VALTREX) 500 MG tablet Take 500 mg by mouth daily as needed.      . Vitamin D, Ergocalciferol, (DRISDOL) 50000 UNITS CAPS Take 50,000 Units by mouth every 7 (seven) days.        No current facility-administered medications on file prior to visit.    Allergies  Allergen Reactions  . Statins     Leg weakness - but tolerating low dose Crestor every other day    Past Medical History  Diagnosis Date  . Hypertension   . Hyperlipidemia     a. Hx of leg weakness while on statin.  . Leg fracture Dec 21,2011  . Hip fracture 11-08-2008  . Chronic anticoagulation     on Xarelto  . High risk medication use     on Flecainide  . GERD (gastroesophageal reflux disease)   . Parkinson's disease 2011  . Arthritis     lt thumb  . Osteoporosis 07/2007  . PAF (paroxysmal atrial fibrillation)     s/p atrial fib ablation 11-11-11.  restarted on Toprol & Flecainide  . Chronic diastolic CHF (congestive heart failure)     Echocardiogram (11/24/12): Mild LVH, EF 60%.    Past Surgical History  Procedure Laterality Date  . Knee surgery Left     2  . US echocardiography  03-10-2008    Est EF 55-60%  . Cardiovascular stress test  06-06-2008    EF 73%  . Wrist surgery    . Cardioversion  09/09/2011    Procedure: CARDIOVERSION;  Surgeon: Darlin Coco, MD;  Location: [facility] ENDOSCOPY;  Service: Cardiovascular;  Laterality: N/A;  to be done by dr. Mare Ferrari  . Tee without cardioversion  11/10/2011    Procedure: TRANSESOPHAGEAL ECHOCARDIOGRAM (TEE);  Surgeon: Thayer Headings, MD;  Location: [place];  Service: Cardiovascular;  Laterality: N/A;  . Cardioversion  02/18/2012    Procedure: CARDIOVERSION;  Surgeon: Thayer Headings, MD;  Location: [place];  Service: Cardiovascular;  Laterality: N/A;  . Cholecystectomy  1995  . Tonsillectomy    . Breast surgery Left     benign cyst removed  . Femur fracture surgery    . Pacemaker  placement  10/20/12    MRI compatible    History  Smoking status  . Never Smoker   Smokeless tobacco  . Not on file  History  Alcohol Use  . 7.2 oz/week  . 12 Glasses of wine per week    Comment: wine 2-3 glasses of wine a day    Family History  Problem Relation Age of Onset  . Alzheimer's disease Mother   . Heart failure Father     Reviw of Systems:  Reviewed in the HPI.  All other systems are negative.  Physical Exam: BP 104/80  Pulse 80  Ht 5\' 2"  (1.575 m)  Wt 149 lb 12.8 oz (67.949 kg)  BMI 27.39 kg/m2  LMP 02/04/1996 The patient is alert and oriented x 3.  The mood and affect are normal.   Skin: warm and dry.  Color is normal.    HEENT:   the sclera are nonicteric.  The mucous membranes are moist.  The carotids are 2+ without bruits.  There is no thyromegaly.  There is no JVD.    Lungs: clear.  The chest wall is non tender.    Heart: Irregularly irregular with a normal S1 and S2.  There are no murmurs, gallops, or rubs. The PMI is not displaced.     Abdomen: good bowel sounds.  There is no guarding or rebound.  There is no hepatosplenomegaly or tenderness.  There are no masses.   Extremities:  no clubbing, cyanosis, or edema.  The legs are without rashes.  The distal pulses are intact.   Neuro:  Cranial nerves II - XII are intact.  Motor and sensory functions are intact.    The gait is normal.  ECG: May 09, 2013:  Atrial pacing at 80.  .  Assessment / Plan:

## 2013-05-09 NOTE — Patient Instructions (Signed)
Your physician recommends that you return for lab work in: today bmet magnesium level  Your physician wants you to follow-up in:6 months  You will receive a reminder letter in the mail two months in advance. If you don't receive a letter, please call our office to schedule the follow-up appointment.

## 2013-05-16 ENCOUNTER — Ambulatory Visit (INDEPENDENT_AMBULATORY_CARE_PROVIDER_SITE_OTHER): Payer: Medicare Other | Admitting: *Deleted

## 2013-05-16 VITALS — HR 80

## 2013-05-16 DIAGNOSIS — I4891 Unspecified atrial fibrillation: Secondary | ICD-10-CM | POA: Diagnosis not present

## 2013-05-16 NOTE — Patient Instructions (Signed)
Follow instructions given to you earlier today by Dr Lehman Prom.

## 2013-05-16 NOTE — Progress Notes (Signed)
Reason for visit: EKG  1.) Name of MD requesting visit: Dr Lehman Prom in [facility]  2.) H&P: pt took Tikosyn at 9 AM  and again about 12:30-12:45PM. She was not due to take another Tikosyn until 9PM.  3.) ROS related to problem: no complaints, pulse regular /EKG normal  4.) Assessment and plan per MD: EKG reviewed and read by Dr Stanford Breed (DOD). Pt advised to follow instructions given to her earlier today by Dr Lehman Prom. EKG faxed to Dr Lehman Prom at [phone].  5.) Provider sign-of(MD statement):   6.)

## 2013-05-18 ENCOUNTER — Ambulatory Visit: Payer: Medicare Other | Admitting: Obstetrics and Gynecology

## 2013-05-24 ENCOUNTER — Ambulatory Visit (INDEPENDENT_AMBULATORY_CARE_PROVIDER_SITE_OTHER): Payer: Medicare Other | Admitting: Obstetrics & Gynecology

## 2013-05-24 ENCOUNTER — Encounter: Payer: Self-pay | Admitting: Obstetrics & Gynecology

## 2013-05-24 ENCOUNTER — Other Ambulatory Visit: Payer: Self-pay | Admitting: Dermatology

## 2013-05-24 VITALS — BP 112/70 | HR 68 | Resp 16 | Ht 61.5 in | Wt 150.0 lb

## 2013-05-24 DIAGNOSIS — Z124 Encounter for screening for malignant neoplasm of cervix: Secondary | ICD-10-CM

## 2013-05-24 DIAGNOSIS — D485 Neoplasm of uncertain behavior of skin: Secondary | ICD-10-CM | POA: Diagnosis not present

## 2013-05-24 DIAGNOSIS — C44721 Squamous cell carcinoma of skin of unspecified lower limb, including hip: Secondary | ICD-10-CM | POA: Diagnosis not present

## 2013-05-24 DIAGNOSIS — C44711 Basal cell carcinoma of skin of unspecified lower limb, including hip: Secondary | ICD-10-CM | POA: Diagnosis not present

## 2013-05-24 DIAGNOSIS — Z01419 Encounter for gynecological examination (general) (routine) without abnormal findings: Secondary | ICD-10-CM | POA: Diagnosis not present

## 2013-05-24 NOTE — Progress Notes (Signed)
73 y.o. G2P2 MarriedCaucasianF here for annual exam.  Going to the [place] on Thursday.  No vaginal bleeding.  Sees Dr. Joylene Draft.   Doing well.  Had two cardiac ablations last year.  Now has a pacemaker.  Patient's last menstrual period was 02/04/1996.          Sexually active: yes  The current method of family planning is post menopausal status.    Exercising: yes  walking, some weights and stretching Smoker:  no  Health Maintenance: Pap:  04/23/09-WNL History of abnormal Pap:  yes MMG:  01/20/13-normal Colonoscopy:  2015-repeat in 5 years BMD:   12/14-started Forteo.  Followed by Dr. Joylene Draft TDaP:  2008 Screening Labs: PCP, Hb today: PCP, Urine today: PCP   reports that she has never smoked. She has never used smokeless tobacco. She reports that she drinks about 7.2 ounces of alcohol per week. She reports that she does not use illicit drugs.  Past Medical History  Diagnosis Date  . Hypertension   . Hyperlipidemia     a. Hx of leg weakness while on statin.  . Leg fracture Dec 21,2011  . Chronic anticoagulation     on Xarelto  . High risk medication use     on Flecainide  . GERD (gastroesophageal reflux disease)   . Parkinson's disease 2011  . Arthritis     lt thumb  . Osteoporosis 07/2007  . PAF (paroxysmal atrial fibrillation)     s/p atrial fib ablation 11-11-11.  restarted on Toprol & Flecainide  . Chronic diastolic CHF (congestive heart failure)     Echocardiogram (11/24/12): Mild LVH, EF 60%.  . Wrist fracture     right    Past Surgical History  Procedure Laterality Date  . Knee surgery Left     2  . US echocardiography  03-10-2008    Est EF 55-60%  . Cardiovascular stress test  06-06-2008    EF 73%  . Wrist surgery    . Cardioversion  09/09/2011    Procedure: CARDIOVERSION;  Surgeon: Darlin Coco, MD;  Location[facility] ENDOSCOPY;  Service: Cardiovascular;  Laterality: N/A;  to be done by dr. Mare Ferrari  . Tee without cardioversion  11/10/2011    Procedure:  TRANSESOPHAGEAL ECHOCARDIOGRAM (TEE);  Surgeon: Thayer Headings, MD;  Location: [place];  Service: Cardiovascular;  Laterality: N/A;  . Cardioversion  02/18/2012    Procedure: CARDIOVERSION;  Surgeon: Thayer Headings, MD;  Location: [place];  Service: Cardiovascular;  Laterality: N/A;  . Cholecystectomy  1995  . Tonsillectomy    . Breast surgery Left     benign cyst removed  . Femur fracture surgery    . 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 Skin biopsy      ? squamous cell  . Ablation      internal and external, then pacemaker    Current Outpatient Prescriptions  Medication Sig Dispense Refill  . B Complex Vitamins (B COMPLEX PO) Take 1 tablet by mouth daily.       . BD PEN NEEDLE NANO U/F 32G X 4 MM MISC       . CALCIUM PO Take 1 tablet by mouth daily.       . Calcium Polycarbophil (FIBERCON PO) Take 1 tablet by mouth daily.       . carbidopa-levodopa (SINEMET) 25-100 MG per tablet Take 1 tablet by mouth 3 (three) times daily.  270 tablet  3  . Coenzyme Q10 (CO Q  10 PO) Take 1 tablet by mouth daily.       Mariane Baumgarten Sodium (COLACE PO) Take 1 capsule by mouth 2 (two) times daily.       [name] dofetilide (TIKOSYN) 250 MCG capsule Take 250 mcg by mouth 2 (two) times daily.      [name] ezetimibe (ZETIA) 10 MG tablet Take 10 mg by mouth every other day. Taking the same day as the crestor      . FORTEO 600 MCG/2.4ML SOLN       . furosemide (LASIX) 20 MG tablet Take 1 tablet (20 mg total) by mouth daily.  30 tablet  1  . losartan (COZAAR) 25 MG tablet TAKE 1 TABLET EVERY DAY  90 tablet  0  . LUTEIN PO Take 1 tablet by mouth daily.      [name] MAGNESIUM PO Take 1 tablet by mouth daily.       . metoprolol tartrate (LOPRESSOR) 25 MG tablet Take 1 tablet (25 mg total) by mouth 2 (two) times daily.  60 tablet  3  . Multiple Vitamin (MULTIVITAMIN PO) Take 1 tablet by mouth daily.       [name] NEXIUM 40 MG capsule Take 1 capsule by mouth daily.      . Omega-3 Fatty Acids (FISH OIL PO) Take 1  capsule by mouth daily.       . potassium chloride (K-DUR,KLOR-CON) 10 MEQ tablet Take 1 tablet (10 mEq total) by mouth daily.  30 tablet  3  . rasagiline (AZILECT) 1 MG TABS tablet Take 1 tablet (1 mg total) by mouth daily.  90 tablet  3  . Rivaroxaban (XARELTO) 20 MG TABS Take 20 mg by mouth daily.      . rosuvastatin (CRESTOR) 5 MG tablet Take 2.5 mg by mouth every other day.      [name] TEMAZEPAM PO Take by mouth as needed.      . valACYclovir (VALTREX) 500 MG tablet Take 500 mg by mouth daily as needed.      . Vitamin D, Ergocalciferol, (DRISDOL) 50000 UNITS CAPS Take 50,000 Units by mouth every 7 (seven) days.       [name] acetaZOLAMIDE (DIAMOX) 125 MG tablet       . TRANSDERM-SCOP 1 MG/3DAYS        No current facility-administered medications for this visit.    Family History  Problem Relation Age of Onset  . Alzheimer's disease Mother   . Heart failure Father     ROS:  Pertinent items are noted in HPI.  Otherwise, a comprehensive ROS was negative.  Exam:   BP 112/70  Pulse 68  Resp 16  Ht 5' 1.5" (1.562 m)  Wt 150 lb (68.04 kg)  BMI 27.89 kg/m2  LMP 02/04/1996  Weight change: +2  Height: 5' 1.5" (156.2 cm)  Ht Readings from Last 3 Encounters:  05/24/13 5' 1.5" (1.562 m)  05/09/13 5\' 2"  (1.575 m)  02/22/13 5\' 2"  (1.575 m)    General appearance: alert, cooperative and appears stated age Head: Normocephalic, without obvious abnormality, atraumatic Neck: no adenopathy, supple, symmetrical, trachea midline and thyroid normal to inspection and palpation Lungs: clear to auscultation bilaterally Breasts: normal appearance, no masses or tenderness Heart: regular rate and rhythm Abdomen: soft, non-tender; bowel sounds normal; no masses,  no organomegaly Extremities: extremities normal, atraumatic, no cyanosis or edema Skin: Skin color, texture, turgor normal. No rashes or lesions Lymph nodes: Cervical, supraclavicular, and axillary nodes normal. No abnormal inguinal nodes  palpated Neurologic:  Grossly normal   Pelvic: External genitalia:  no lesions              Urethra:  normal appearing urethra with no masses, tenderness or lesions              Bartholins and Skenes: normal                 Vagina: normal appearing vagina with normal color and discharge, no lesions              Cervix: no lesions              Pap taken: yes Bimanual Exam:  Uterus:  normal size, contour, position, consistency, mobility, non-tender              Adnexa: normal adnexa and no mass, fullness, tenderness               Rectovaginal: Confirms               Anus:  normal sphincter tone, no lesions  A:  Well Woman with normal exam PMP, no HRT H/O afib H/O osteoporosis, on Forteo Parkinson's Disease  P:   Mammogram yearly.  D/W pt 3D MMG.  She did one last year. pap smear obtained.  Pap only. Labs with Dr. Joylene Draft and cardiologist return annually or prn  An After Visit Summary was printed and given to the patient.

## 2013-05-27 LAB — IPS PAP SMEAR ONLY

## 2013-06-06 ENCOUNTER — Other Ambulatory Visit: Payer: Self-pay | Admitting: Dermatology

## 2013-06-06 DIAGNOSIS — C44721 Squamous cell carcinoma of skin of unspecified lower limb, including hip: Secondary | ICD-10-CM | POA: Diagnosis not present

## 2013-06-06 DIAGNOSIS — Z85828 Personal history of other malignant neoplasm of skin: Secondary | ICD-10-CM | POA: Diagnosis not present

## 2013-06-07 ENCOUNTER — Ambulatory Visit: Payer: Medicare Other | Admitting: Neurology

## 2013-06-10 DIAGNOSIS — I4891 Unspecified atrial fibrillation: Secondary | ICD-10-CM | POA: Diagnosis not present

## 2013-06-10 DIAGNOSIS — Z45018 Encounter for adjustment and management of other part of cardiac pacemaker: Secondary | ICD-10-CM | POA: Diagnosis not present

## 2013-06-20 ENCOUNTER — Ambulatory Visit (INDEPENDENT_AMBULATORY_CARE_PROVIDER_SITE_OTHER): Payer: Medicare Other | Admitting: Neurology

## 2013-06-20 ENCOUNTER — Encounter: Payer: Self-pay | Admitting: Neurology

## 2013-06-20 VITALS — BP 118/76 | HR 83 | Ht 62.21 in | Wt 149.2 lb

## 2013-06-20 DIAGNOSIS — G4700 Insomnia, unspecified: Secondary | ICD-10-CM | POA: Diagnosis not present

## 2013-06-20 DIAGNOSIS — G20 Parkinson's disease: Secondary | ICD-10-CM

## 2013-06-20 MED ORDER — CLONAZEPAM 0.5 MG PO TABS: 0.5000 mg | ORAL_TABLET | Freq: Every day | ORAL | Status: DC

## 2013-06-20 NOTE — Progress Notes (Signed)
[name] [name] was seen today in the movement disorders clinic for neurologic consultation at the request of [name],[name], MD.  The consultation is for the evaluation of PD.  She has previously seen [name] and [name].  I have some of [name] notes.  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  She had a normal MRI brain at that time according to notes from Dr. love.  In 2011, her diagnosis was changed to Parkinson's disease and Azilect was added.  She had a second opinion at [facility] on 07/25/2009 and [name] also thought that she had Parkinson's disease.  In January, 2014 [name] added Artane, presumably for tremor.  She does think that it has helped the tremor.  02/22/13 update:  The pt is f/u today re: PD.  After our discussion last visit, she decided to stop the artane and states that while she noted a little increase in tremor, it hasn't been bad.  I had wanted to start her on carbidopa/levodopa 25/100 but she wanted to hold on that last vist.  Balance is good but she has felt more slow and stiff.  No falls.  No hallucinations.  No n/v.  No near syncope.  Not exercising faithfully.  06/20/13 update:  The pt is f/u today re: PD.  She was started on carbidopa/levodopa 25/100 last visit in January in addition to the azilect that she was already on.  She just returned from a trip from the [place] and was so surprised that she could do all of the planned excursions.  She felt great that she could do all the hiking and could get in and out of the zodiac boat.  She states that she is so glad she started taking the levodopa and thinks that her mood is better.  Her coordination and tremor are better but she still has some tremor.  Some minor stomach upset.  Pharmacist told her to take it with food and she admits that she is taking it with protein.  She last took levodopa at 7:30 am and was examined at 10am.  She is having difficulty staying asleep.  She was hospitalized since last visit with CHF. I  reviewed her MRI of the brain from April, 2009.  It was unremarkable.  Formal report is below:  Clinical Data: Syncopal episode. Confusion.  MRI HEAD WITHOUT CONTRAST  MRA HEAD WITHOUT CONTRAST  Technique: Multiplanar, multiecho pulse sequences of the brain and  surrounding structures were obtained according to standard protocol  with intravenous contrast. Angiographic images of the head were  obtained using MRA technique without contrast.  Comparison: No comparison MR. Prior CT 05/20/2007.  MRI HEAD  Findings: No acute infarct. No intracranial hemorrhage. No  hydrocephalous. No intracranial mass lesion detected on this  unenhanced exam. Major intracranial vascular structures are  patent. The ectatic vertebral arteries cause slight indentation  upon the medulla. Very minimal partial opacification mastoid air  cells.  IMPRESSION:  No acute infarct.  MRA HEAD  Findings: Ectasia of the distal vertical segment of the carotid  arteries bilaterally. Mild ectasia of the cavernous segment of the  internal carotid arteries bilaterally. Minimal narrowing proximal  A1 segment of the anterior cerebral arteries bilaterally.  Ectatic vertebral arteries and basilar artery. The AICAs are not  seen on either side. Remainder of major intracranial vascular  structures are patent. Minimal branch vessel irregularity. No  aneurysm visualized.  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  PREVIOUS MEDICATIONS: Azilect and artane  ALLERGIES:   Allergies  Allergen Reactions  . Statins     Leg weakness - but tolerating low dose Crestor every other day    CURRENT MEDICATIONS:  Current Outpatient Prescriptions on File Prior to Visit  Medication Sig Dispense Refill  . acetaZOLAMIDE (DIAMOX) 125 MG tablet       . B Complex Vitamins (B COMPLEX PO) Take 1 tablet by mouth daily.       . BD PEN NEEDLE NANO U/F 32G X 4 MM MISC       . CALCIUM PO Take 1  tablet by mouth daily.       . Calcium Polycarbophil (FIBERCON PO) Take 1 tablet by mouth daily.       . carbidopa-levodopa (SINEMET) 25-100 MG per tablet Take 1 tablet by mouth 3 (three) times daily.  270 tablet  3  . Coenzyme Q10 (CO Q 10 PO) Take 1 tablet by mouth daily.       Mariane Baumgarten Sodium (COLACE PO) Take 1 capsule by mouth 2 (two) times daily.       [name] dofetilide (TIKOSYN) 250 MCG capsule Take 250 mcg by mouth 2 (two) times daily.      [name] ezetimibe (ZETIA) 10 MG tablet Take 10 mg by mouth every other day. Taking the same day as the crestor      . FORTEO 600 MCG/2.4ML SOLN       . furosemide (LASIX) 20 MG tablet Take 1 tablet (20 mg total) by mouth daily.  30 tablet  1  . losartan (COZAAR) 25 MG tablet TAKE 1 TABLET EVERY DAY  90 tablet  0  . LUTEIN PO Take 1 tablet by mouth daily.      [name] MAGNESIUM PO Take 1 tablet by mouth daily.       . metoprolol tartrate (LOPRESSOR) 25 MG tablet Take 1 tablet (25 mg total) by mouth 2 (two) times daily.  60 tablet  3  . Multiple Vitamin (MULTIVITAMIN PO) Take 1 tablet by mouth daily.       [name] NEXIUM 40 MG capsule Take 1 capsule by mouth daily.      . Omega-3 Fatty Acids (FISH OIL PO) Take 1 capsule by mouth daily.       . potassium chloride (K-DUR,KLOR-CON) 10 MEQ tablet Take 1 tablet (10 mEq total) by mouth daily.  30 tablet  3  . rasagiline (AZILECT) 1 MG TABS tablet Take 1 tablet (1 mg total) by mouth daily.  90 tablet  3  . Rivaroxaban (XARELTO) 20 MG TABS Take 20 mg by mouth daily.      . rosuvastatin (CRESTOR) 5 MG tablet Take 2.5 mg by mouth every other day.      [name] TEMAZEPAM PO Take by mouth as needed.      . TRANSDERM-SCOP 1 MG/3DAYS       . valACYclovir (VALTREX) 500 MG tablet Take 500 mg by mouth daily as needed.      . Vitamin D, Ergocalciferol, (DRISDOL) 50000 UNITS CAPS Take 50,000 Units by mouth every 7 (seven) days.        No current facility-administered medications on file prior to visit.    PAST MEDICAL HISTORY:   Past Medical  History  Diagnosis Date  . Hypertension   . Hyperlipidemia     a. Hx of leg weakness while on statin.  . Leg fracture Dec 21,2011  . Chronic anticoagulation     on  Xarelto  . High risk medication use     on Flecainide  . GERD (gastroesophageal reflux disease)   . Parkinson's disease 2011  . Arthritis     lt thumb  . Osteoporosis 07/2007  . PAF (paroxysmal atrial fibrillation)     s/p atrial fib ablation 11-11-11.  restarted on Toprol & Flecainide  . Chronic diastolic CHF (congestive heart failure)     Echocardiogram (11/24/12): Mild LVH, EF 60%.  . Wrist fracture     right    PAST SURGICAL HISTORY:   Past Surgical History  Procedure Laterality Date  . Knee surgery Left     2  . US echocardiography  03-10-2008    Est EF 55-60%, Dr. Cathie Olden  . Cardiovascular stress test  06-06-2008    EF 73%  . Wrist surgery    . Cardioversion  09/09/2011    Procedure: CARDIOVERSION;  Surgeon: Darlin Coco, MD;  Location: [facility] ENDOSCOPY;  Service: Cardiovascular;  Laterality: N/A;  to be done by dr. Mare Ferrari  . Tee without cardioversion  11/10/2011    Procedure: TRANSESOPHAGEAL ECHOCARDIOGRAM (TEE);  Surgeon: Thayer Headings, MD;  Location: [place];  Service: Cardiovascular;  Laterality: N/A;  . Cardioversion  02/18/2012    Procedure: CARDIOVERSION;  Surgeon: Thayer Headings, MD;  Location: [place];  Service: Cardiovascular;  Laterality: N/A;  . Cholecystectomy  1995  . Tonsillectomy    . Breast surgery Left     benign cyst removed  . Femur fracture surgery    . 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 Cardiac electrophysiology study and ablation  5/14, 7/14    SOCIAL HISTORY:   History   Social History  . Marital Status: Married    Spouse Name: N/A    Number of Children: N/A  . Years of Education: N/A   Occupational History  . Not on file.   Social History Main Topics  . Smoking status: Never Smoker   . Smokeless tobacco: Never Used  . Alcohol Use: 7.2 oz/week     12 Glasses of wine per week  . Drug Use: No  . Sexual Activity: Yes    Partners: Male    Birth Control/ Protection: Post-menopausal   Other Topics Concern  . Not on file   Social History Narrative   Lives in [place].  Retired Licensed conveyancer.    FAMILY HISTORY:   Family Status  Relation Status Death Age  . Mother Deceased 35    AD   . Father Deceased 84    Valve-rheumatic fever as child  . Child Alive     healthy  . Child Alive     healthy    ROS:  A complete 10 system review of systems was obtained and was unremarkable apart from what is mentioned above.  PHYSICAL EXAMINATION:    VITALS:   Filed Vitals:   06/20/13 0935  BP: 118/76  Pulse: 83  Height: 5' 2.21" (1.58 m)  Weight: 149 lb 3 oz (67.671 kg)  SpO2: 96%    GEN:  The patient appears stated age and is in NAD. HEENT:  Normocephalic, atraumatic.  The mucous membranes are moist. The superficial temporal arteries are without ropiness or tenderness. CV:  RRR.   Lungs:  CTAB Neck/HEME:  There are no carotid bruits bilaterally.  Neurological examination:  Orientation: The patient is alert and oriented x3. Fund of knowledge is appropriate.  Recent and remote memory are intact.  Attention and concentration are normal.  Able to name objects and repeat phrases.   Movement examination: Tone: There is no rigidity (improvement) Abnormal movements: There is a mild, intermittent resting tremor of the right upper and lower extremity.  When she ambulates, she has a tremor of LUE. Coordination:  There is no decremation with RAMs today. Gait and Station: The patient has no difficulty arising out of a deep-seated chair without the use of the hands. The patient's stride length is mildly decreased.  Tremor increases with ambulation.  The patient has a negative pull test.      ASSESSMENT/PLAN:  1.  Parkinsonism.  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  The patient  has tremor, bradykinesia, rigidity and mild postural instability.  -She is doing better on the carbidopa/levodopa 25/100, one tablet 3 times per day.  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  Nonetheless, she is happy with how she is doing and she is much less rigid and is more coordinated and I think she is doing well.  -She will remain on the Azilect.  -Patient education was provided.  We discussed safety. 2.  Insomnia.  -She is having difficulty staying asleep.  She will discontinue the temazepam and we will add clonazepam.  Risks, benefits, side effects and alternative therapies were discussed.  The opportunity to ask questions was given and they were answered to the best of my ability.  The patient expressed understanding and willingness to follow the outlined treatment protocols. 3.  Follow up in the next few months, sooner should new neurologic issues arise.

## 2013-06-20 NOTE — Patient Instructions (Addendum)
1. Take clonazepam 1/2 - 1 tablet by mouth at bedtime.  2. Stop Timazepam 3. Follow up in 3 months.

## 2013-06-23 DIAGNOSIS — K432 Incisional hernia without obstruction or gangrene: Secondary | ICD-10-CM | POA: Diagnosis not present

## 2013-06-23 DIAGNOSIS — I4891 Unspecified atrial fibrillation: Secondary | ICD-10-CM | POA: Diagnosis not present

## 2013-07-04 ENCOUNTER — Other Ambulatory Visit: Payer: Self-pay | Admitting: *Deleted

## 2013-07-04 MED ORDER — METOPROLOL TARTRATE 25 MG PO TABS: 25.0000 mg | ORAL_TABLET | Freq: Two times a day (BID) | ORAL | Status: DC

## 2013-07-04 MED ORDER — LOSARTAN POTASSIUM 25 MG PO TABS: ORAL_TABLET | ORAL | Status: DC

## 2013-07-04 MED ORDER — FUROSEMIDE 20 MG PO TABS: 20.0000 mg | ORAL_TABLET | Freq: Every day | ORAL | Status: DC

## 2013-07-21 ENCOUNTER — Encounter (INDEPENDENT_AMBULATORY_CARE_PROVIDER_SITE_OTHER): Payer: Self-pay | Admitting: Surgery

## 2013-07-21 ENCOUNTER — Ambulatory Visit (INDEPENDENT_AMBULATORY_CARE_PROVIDER_SITE_OTHER): Payer: Medicare Other | Admitting: Surgery

## 2013-07-21 VITALS — BP 122/80 | HR 81 | Temp 98.3°F | Resp 16 | Ht 62.0 in | Wt 147.0 lb

## 2013-07-21 DIAGNOSIS — Z7901 Long term (current) use of anticoagulants: Secondary | ICD-10-CM

## 2013-07-21 DIAGNOSIS — K432 Incisional hernia without obstruction or gangrene: Secondary | ICD-10-CM | POA: Insufficient documentation

## 2013-07-21 NOTE — Progress Notes (Signed)
Subjective:     Patient ID: [name] [name], female   DOB: [birth year], 73 y.o.   MRN: [id number]  HPI  Note: Portions of this report may have been transcribed using voice recognition software. Every effort was made to ensure accuracy; however, inadvertent computerized transcription errors may be present.   Any transcriptional errors that result from this process are unintentional.            [name] [name]  [birth year] [id number]  Patient Care Team: Jerlyn Ly, MD as PCP - General (Internal Medicine) Thayer Headings, MD as Consulting Physician (Cardiology) Carney Bern as Referring Physician (Cardiothoracic Surgery)  This patient is a 73 y.o.female who presents today for surgical evaluation at the request of Dr. Mervyn Skeeters.   Reason for visit: Incisional hernia  Pleasant female with chronic atrial fibrillation.  Underwent attempted atrial ablation through an epigastric ventral incision.   It seemed to initially make a marked improvement.  However she does have some breakthrough atrial fibrillation.  Perhaps related to a high altitude vacation.  She is back on anticoagulation.  She is noted a bulging at the incision just below her breast bone in the upper abdomen.  It has gotten larger.  Concern for incisional hernia.  Surgical consultation requested.  She is quite active.  Walks about an hour a day.  Has a bowel movement every day.  Denies any history of MRSA or infections.  Likes to travel.  Due to go to [place] in a few months.  [place].  Patient Active Problem List   Diagnosis Date Noted  . Ventral incisional hernia - epigastric 07/21/2013  . Long term (current) use of anticoagulants 07/21/2013  . Chronic diastolic heart failure 82/99/3716  . Heart failure, acute diastolic 96/78/9381  . Shortness of breath 11/23/2012  . Paralysis agitans 10/26/2012  . Hypertension 10/12/2011  . Chest pain 06/03/2011  . Atrial fibrillation, chronic 10/31/2010    Past  Medical History  Diagnosis Date  . Hypertension   . Hyperlipidemia     a. Hx of leg weakness while on statin.  . Leg fracture Dec 21,2011  . Chronic anticoagulation     on Xarelto  . High risk medication use     on Flecainide  . GERD (gastroesophageal reflux disease)   . Parkinson's disease 2011  . Arthritis     lt thumb  . Osteoporosis 07/2007  . PAF (paroxysmal atrial fibrillation)     s/p atrial fib ablation 11-11-11.  restarted on Toprol & Flecainide  . Chronic diastolic CHF (congestive heart failure)     Echocardiogram (11/24/12): Mild LVH, EF 60%.  . Wrist fracture     right    Past Surgical History  Procedure Laterality Date  . Knee surgery Left     2  . US echocardiography  03-10-2008    Est EF 55-60%, Dr. Cathie Olden  . Cardiovascular stress test  06-06-2008    EF 73%  . Wrist surgery    . Cardioversion  09/09/2011    Procedure: CARDIOVERSION;  Surgeon: Darlin Coco, MD;  Location: [facility] ENDOSCOPY;  Service: Cardiovascular;  Laterality: N/A;  to be done by dr. Mare Ferrari  . Tee without cardioversion  11/10/2011    Procedure: TRANSESOPHAGEAL ECHOCARDIOGRAM (TEE);  Surgeon: Thayer Headings, MD;  Location: [place];  Service: Cardiovascular;  Laterality: N/A;  . Cardioversion  02/18/2012    Procedure: CARDIOVERSION;  Surgeon: Thayer Headings, MD;  Location: [place];  Service: Cardiovascular;  Laterality: N/A;  . Cholecystectomy  1995  . Tonsillectomy    . Breast surgery Left     benign cyst removed  . Femur fracture surgery    . 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 Cardiac electrophysiology study and ablation  5/14, 7/14    History   Social History  . Marital Status: Married    Spouse Name: N/A    Number of Children: N/A  . Years of Education: N/A   Occupational History  . Not on file.   Social History Main Topics  . Smoking status: Never Smoker   . Smokeless tobacco: Never Used  . Alcohol Use: 7.2 oz/week    12 Glasses of wine per week  .  Drug Use: No  . Sexual Activity: Yes    Partners: Male    Birth Control/ Protection: Post-menopausal   Other Topics Concern  . Not on file   Social History Narrative   Lives in [place].  Retired Licensed conveyancer.    Family History  Problem Relation Age of Onset  . Alzheimer's disease Mother   . Heart failure Father     Current Outpatient Prescriptions  Medication Sig Dispense Refill  . B Complex Vitamins (B COMPLEX PO) Take 1 tablet by mouth daily.       . BD PEN NEEDLE NANO U/F 32G X 4 MM MISC       . CALCIUM PO Take 1 tablet by mouth daily.       . Calcium Polycarbophil (FIBERCON PO) Take 1 tablet by mouth daily.       . carbidopa-levodopa (SINEMET) 25-100 MG per tablet Take 1 tablet by mouth 3 (three) times daily.  270 tablet  3  . clonazePAM (KLONOPIN) 0.5 MG tablet Take 1 tablet (0.5 mg total) by mouth at bedtime.  30 tablet  3  . Coenzyme Q10 (CO Q 10 PO) Take 1 tablet by mouth daily.       [name] [name] Sodium (COLACE PO) Take 1 capsule by mouth 2 (two) times daily.       [name] dofetilide (TIKOSYN) 250 MCG capsule Take 250 mcg by mouth 2 (two) times daily.      [name] ezetimibe (ZETIA) 10 MG tablet Take 10 mg by mouth every other day. Taking the same day as the crestor      . FORTEO 600 MCG/2.4ML SOLN       . furosemide (LASIX) 20 MG tablet Take 1 tablet (20 mg total) by mouth daily.  30 tablet  3  . losartan (COZAAR) 25 MG tablet TAKE 1 TABLET EVERY DAY  90 tablet  1  . LUTEIN PO Take 1 tablet by mouth daily.      [name] MAGNESIUM PO Take 1 tablet by mouth daily.       . metoprolol tartrate (LOPRESSOR) 25 MG tablet Take 1 tablet (25 mg total) by mouth 2 (two) times daily.  60 tablet  3  . Multiple Vitamin (MULTIVITAMIN PO) Take 1 tablet by mouth daily.       [name] NEXIUM 40 MG capsule Take 1 capsule by mouth daily.      . Omega-3 Fatty Acids (FISH OIL PO) Take 1 capsule by mouth daily.       . potassium chloride (K-DUR,KLOR-CON) 10 MEQ tablet Take 1 tablet (10 mEq total) by mouth daily.  30  tablet  3  . rasagiline (AZILECT) 1 MG TABS tablet Take 1 tablet (1 mg total) by mouth daily.  90 tablet  3  . Rivaroxaban (XARELTO) 20 MG TABS Take 20 mg by mouth daily.      . rosuvastatin (CRESTOR) 5 MG tablet Take 2.5 mg by mouth every other day.      . valACYclovir (VALTREX) 500 MG tablet Take 500 mg by mouth daily as needed.      . Vitamin D, Ergocalciferol, (DRISDOL) 50000 UNITS CAPS Take 50,000 Units by mouth every 7 (seven) days.        No current facility-administered medications for this visit.     Allergies  Allergen Reactions  . Statins     Leg weakness - but tolerating low dose Crestor every other day    BP 122/80  Pulse 81  Temp(Src) 98.3 F (36.8 C)  Resp 16  Ht 5\' 2"  (1.575 m)  Wt 147 lb (66.679 kg)  BMI 26.88 kg/m2  LMP 02/04/1996  No results found.   Review of Systems  Constitutional: Negative for fever, chills, diaphoresis, appetite change and fatigue.  HENT: Negative for ear discharge, ear pain, sore throat and trouble swallowing.   Eyes: Negative for photophobia, discharge and visual disturbance.  Respiratory: Negative for cough, choking, chest tightness and shortness of breath.   Cardiovascular: Positive for palpitations. Negative for chest pain.  Gastrointestinal: Negative for nausea, vomiting, abdominal pain, diarrhea, constipation, anal bleeding and rectal pain.  Endocrine: Negative for cold intolerance and heat intolerance.  Genitourinary: Negative for dysuria, frequency and difficulty urinating.  Musculoskeletal: Negative for gait problem, myalgias and neck pain.  Skin: Negative for color change, pallor and rash.  Allergic/Immunologic: Negative for environmental allergies, food allergies and immunocompromised state.  Neurological: Negative for dizziness, speech difficulty, weakness and numbness.  Hematological: Negative for adenopathy.  Psychiatric/Behavioral: Negative for confusion and agitation. The patient is not nervous/anxious.          Objective:   Physical Exam  Constitutional: She is oriented to person, place, and time. She appears well-developed and well-nourished. No distress.  HENT:  Head: Normocephalic.  Mouth/Throat: Oropharynx is clear and moist. No oropharyngeal exudate.  Eyes: Conjunctivae and EOM are normal. Pupils are equal, round, and reactive to light. No scleral icterus.  Neck: Normal range of motion. Neck supple. No tracheal deviation present.  Cardiovascular: Normal rate, regular rhythm and intact distal pulses.   Pulmonary/Chest: Effort normal and breath sounds normal. No stridor. No respiratory distress. She exhibits no tenderness.  Abdominal: Soft. She exhibits no distension and no mass. There is no tenderness. There is no rigidity, no rebound, no guarding, no tenderness at McBurney's point and negative Murphy's sign. A hernia is present. Hernia confirmed positive in the ventral area. Hernia confirmed negative in the right inguinal area and confirmed negative in the left inguinal area.    Genitourinary: No vaginal discharge found.  Musculoskeletal: Normal range of motion. She exhibits no tenderness.       Right elbow: She exhibits normal range of motion.       Left elbow: She exhibits normal range of motion.       Right wrist: She exhibits normal range of motion.       Left wrist: She exhibits normal range of motion.       Right hand: Normal strength noted.       Left hand: Normal strength noted.  Lymphadenopathy:       Head (right side): No posterior auricular adenopathy present.       Head (left side): No posterior auricular adenopathy present.    She  has no cervical adenopathy.    She has no axillary adenopathy.       Right: No inguinal adenopathy present.       Left: No inguinal adenopathy present.  Neurological: She is alert and oriented to person, place, and time. No cranial nerve deficit. She exhibits normal muscle tone. Coordination normal.  Skin: Skin is warm and dry. No rash noted. She  is not diaphoretic. No erythema.  Psychiatric: She has a normal mood and affect. Her behavior is normal. Judgment and thought content normal.       Assessment:     Epigastric ventral incisional hernia.     Plan:     I think she would benefit from surgical repair.  I think it will require mesh repair.  I would plan a laparoscopic approach.  Some increased risk of pain with fixation near the ribs but will give it the best chance to avoid recurrence.  Because is not currently symptomatic at this time, it is reasonable wait a couple months till she returns from [place] when she has more time to recover.  I discussed the procedure with her.  She will call us to let us know when she wishes to plan it.  Probably early fall:  The anatomy & physiology of the abdominal wall was discussed.  The pathophysiology of hernias was discussed.  Natural history risks without surgery including progeressive enlargement, pain, incarceration & strangulation was discussed.   Contributors to complications such as smoking, obesity, diabetes, prior surgery, etc were discussed.   I feel the risks of no intervention will lead to serious problems that outweigh the operative risks; therefore, I recommended surgery to reduce and repair the hernia.  I explained laparoscopic techniques with possible need for an open approach.  I noted the probable use of mesh to patch and/or buttress the hernia repair  Risks such as bleeding, infection, abscess, need for further treatment, heart attack, death, and other risks were discussed.  I noted a good likelihood this will help address the problem.   Goals of post-operative recovery were discussed as well.  Possibility that this will not correct all symptoms was explained.  I stressed the importance of low-impact activity, aggressive pain control, avoiding constipation, & not pushing through pain to minimize risk of post-operative chronic pain or injury. Possibility of reherniation especially  with smoking, obesity, diabetes, immunosuppression, and other health conditions was discussed.  We will work to minimize complications.     An educational handout further explaining the pathology & treatment options was given as well.  Questions were answered.  The patient expresses understanding & wishes to proceed with surgery.  Because she is on chronic anticoagulation,  I recommended obtaining preoperative cardiac clearance.  I am concerned about the health of the patient and the ability to tolerate the operation.  Therefore, we will request clearance by cardiology to better assess operative risk & see if a reevaluation, further workup, adjustment to medications, etc is needed.  Hopefully she can come off Xerelto perioperatively and avoid a Lovenox bridge.

## 2013-07-21 NOTE — Patient Instructions (Signed)
Please consider the recommendations that we have given you today:  Consider laparoscopic repair with mesh of your upper abdominal incisional hernia.  He will need clearance from your cardiologist to make sure it is safe to come off of anticoagulation (rivaroxaban / Lennette Bihari)  See the Handout(s) we have given you.  Please call our office at [phone] if you wish to schedule surgery or if you have further questions / concerns.   Hernia A hernia occurs when an internal organ pushes out through a weak spot in the abdominal wall. Hernias most commonly occur in the groin and around the navel. Hernias often can be pushed back into place (reduced). Most hernias tend to get worse over time. Some abdominal hernias can get stuck in the opening (irreducible or incarcerated hernia) and cannot be reduced. An irreducible abdominal hernia which is tightly squeezed into the opening is at risk for impaired blood supply (strangulated hernia). A strangulated hernia is a medical emergency. Because of the risk for an irreducible or strangulated hernia, surgery may be recommended to repair a hernia. CAUSES   Heavy lifting.  Prolonged coughing.  Straining to have a bowel movement.  A cut (incision) made during an abdominal surgery. HOME CARE INSTRUCTIONS   Bed rest is not required. You may continue your normal activities.  Avoid lifting more than 10 pounds (4.5 kg) or straining.  Cough gently. If you are a smoker it is best to stop. Even the best hernia repair can break down with the continual strain of coughing. Even if you do not have your hernia repaired, a cough will continue to aggravate the problem.  Do not wear anything tight over your hernia. Do not try to keep it in with an outside bandage or truss. These can damage abdominal contents if they are trapped within the hernia sac.  Eat a normal diet.  Avoid constipation. Straining over long periods of time will increase hernia size and encourage  breakdown of repairs. If you cannot do this with diet alone, stool softeners may be used. SEEK IMMEDIATE MEDICAL CARE IF:   You have a fever.  You develop increasing abdominal pain.  You feel nauseous or vomit.  Your hernia is stuck outside the abdomen, looks discolored, feels hard, or is tender.  You have any changes in your bowel habits or in the hernia that are unusual for you.  You have increased pain or swelling around the hernia.  You cannot push the hernia back in place by applying gentle pressure while lying down. MAKE SURE YOU:   Understand these instructions.  Will watch your condition.  Will get help right away if you are not doing well or get worse. Document Released: 01/20/2005 Document Revised: 04/14/2011 Document Reviewed: 09/09/2007 [facility] Patient Information 2015 [place], [place]. This information is not intended to replace advice given to you by your health care Zen Cedillos. Make sure you discuss any questions you have with your health care Jarek Longton.  HERNIA REPAIR: POST OP INSTRUCTIONS  1. DIET: Follow a light bland diet the first 24 hours after arrival home, such as soup, liquids, crackers, etc.  Be sure to include lots of fluids daily.  Avoid fast food or heavy meals as your are more likely to get nauseated.  Eat a low fat the next few days after surgery. 2. Take your usually prescribed home medications unless otherwise directed. 3. PAIN CONTROL: a. Pain is best controlled by a usual combination of three different methods TOGETHER: i. Ice/Heat ii. Over the counter  pain medication iii. Prescription pain medication b. Most patients will experience some swelling and bruising around the hernia(s) such as the bellybutton, groins, or old incisions.  Ice packs or heating pads (30-60 minutes up to 6 times a day) will help. Use ice for the first few days to help decrease swelling and bruising, then switch to heat to help relax tight/sore spots and speed recovery.  Some  people prefer to use ice alone, heat alone, alternating between ice & heat.  Experiment to what works for you.  Swelling and bruising can take several weeks to resolve.   c. It is helpful to take an over-the-counter pain medication regularly for the first few weeks.  Choose one of the following that works best for you: i. Naproxen (Aleve, etc)  Two 220mg  tabs twice a day ii. Ibuprofen (Advil, etc) Three 200mg  tabs four times a day (every meal & bedtime) iii. Acetaminophen (Tylenol, etc) 325-650mg  four times a day (every meal & bedtime) d. A  prescription for pain medication should be given to you upon discharge.  Take your pain medication as prescribed.  i. If you are having problems/concerns with the prescription medicine (does not control pain, nausea, vomiting, rash, itching, etc), please call us [phone] to see if we need to switch you to a different pain medicine that will work better for you and/or control your side effect better. ii. If you need a refill on your pain medication, please contact your pharmacy.  They will contact our office to request authorization. Prescriptions will not be filled after 5 pm or on week-ends. 4. Avoid getting constipated.  Between the surgery and the pain medications, it is common to experience some constipation.  Increasing fluid intake and taking a fiber supplement (such as Metamucil, Citrucel, FiberCon, MiraLax, etc) 1-2 times a day regularly will usually help prevent this problem from occurring.  A mild laxative (prune juice, Milk of Magnesia, MiraLax, etc) should be taken according to package directions if there are no bowel movements after 48 hours.   5. Wash / shower every day.  You may shower over the dressings as they are waterproof.   6. Remove your waterproof bandages 5 days after surgery.  You may leave the incision open to air.  You may replace a dressing/Band-Aid to cover the incision for comfort if you wish.  Continue to shower over incision(s)  after the dressing is off.    7. ACTIVITIES as tolerated:   a. You may resume regular (light) daily activities beginning the next day-such as daily self-care, walking, climbing stairs-gradually increasing activities as tolerated.  If you can walk 30 minutes without difficulty, it is safe to try more intense activity such as jogging, treadmill, bicycling, low-impact aerobics, swimming, etc. b. Save the most intensive and strenuous activity for last such as sit-ups, heavy lifting, contact sports, etc  Refrain from any heavy lifting or straining until you are off narcotics for pain control.   c. DO NOT PUSH THROUGH PAIN.  Let pain be your guide: If it hurts to do something, don't do it.  Pain is your body warning you to avoid that activity for another week until the pain goes down. d. You may drive when you are no longer taking prescription pain medication, you can comfortably wear a seatbelt, and you can safely maneuver your car and apply brakes. e. Dennis Bast may have sexual intercourse when it is comfortable.  8. FOLLOW UP in our office a. Please call [facility] at [phone]  to set up an appointment to see your surgeon in the office for a follow-up appointment approximately 2-3 weeks after your surgery. b. Make sure that you call for this appointment the day you arrive home to insure a convenient appointment time. 9.  IF YOU HAVE DISABILITY OR FAMILY LEAVE FORMS, BRING THEM TO THE OFFICE FOR PROCESSING.  DO NOT GIVE THEM TO YOUR DOCTOR.  WHEN TO CALL us [phone]: 1. Poor pain control 2. Reactions / problems with new medications (rash/itching, nausea, etc)  3. Fever over 101.5 F (38.5 C) 4. Inability to urinate 5. Nausea and/or vomiting 6. Worsening swelling or bruising 7. Continued bleeding from incision. 8. Increased pain, redness, or drainage from the incision   The clinic staff is available to answer your questions during regular business hours (8:30am-5pm).  Please don't hesitate to call  and ask to speak to one of our nurses for clinical concerns.   If you have a medical emergency, go to the nearest emergency room or call 911.  A surgeon from [facility] is always on call at the hospitals in [facility], [place], [place], [place], [place]  [address] ?  [address] [address], [place], [place]   [address] MAIN: [phone] ? TOLL FREE: [phone] ? FAX: [phone] [email or web address]

## 2013-07-22 ENCOUNTER — Telehealth (INDEPENDENT_AMBULATORY_CARE_PROVIDER_SITE_OTHER): Payer: Self-pay | Admitting: General Surgery

## 2013-07-22 ENCOUNTER — Encounter (INDEPENDENT_AMBULATORY_CARE_PROVIDER_SITE_OTHER): Payer: Self-pay | Admitting: Surgery

## 2013-07-22 NOTE — Telephone Encounter (Signed)
Message copied by Flossie Buffy on Fri Jul 22, 2013  9:17 AM ------      Message from: Salvatore Marvel      Created: Fri Jul 22, 2013  8:41 AM      Regarding: Dr. Gross/Cardiologist info      Contact: [phone]       Patient called with and states her cardiologist is Dr. Aretha Parrot in [facility], can call him at ph# [phone] , if you have any questions you can call her.            Thank you. ------

## 2013-07-22 NOTE — Telephone Encounter (Signed)
Called patient back in regards to her cardiac clearance, she has decided she would like clearance letters sent to both her [facility] and [facility] cardiologist.  She informed me that these two physicians would include: Dr. Acie Fredrickson and Dr. Lehman Prom. Informed her that I have faxed both of these letters and once we receive cardiac clearance from them then we can move forward by having the surgery schedulers call her and set up the surgery date.  The patient verbalized understanding of this information.

## 2013-07-26 ENCOUNTER — Telehealth (INDEPENDENT_AMBULATORY_CARE_PROVIDER_SITE_OTHER): Payer: Self-pay | Admitting: General Surgery

## 2013-07-26 NOTE — Telephone Encounter (Signed)
Called Dr. Heide Guile office to check up on the cardiac clearance letter that was faxed to them on 07/22/13.  They informed me that they would deliver the message to his nurse and they would have her call me back when she was not with a patient. Informed them to call back and ask to speak with myself or Alisha.

## 2013-08-03 ENCOUNTER — Encounter (INDEPENDENT_AMBULATORY_CARE_PROVIDER_SITE_OTHER): Payer: Self-pay

## 2013-08-03 ENCOUNTER — Telehealth (INDEPENDENT_AMBULATORY_CARE_PROVIDER_SITE_OTHER): Payer: Self-pay

## 2013-08-03 NOTE — Telephone Encounter (Signed)
Called pt to notify her that we did receive cardiac clearance's from both physician's Dr Acie Fredrickson and Dr Lehman Prom. Pt advised once she gets her surgery date that she will need to stop the Xarelto 5 days before surgery per Dr Johney [place]. I am turning pt's surgery orders into scheduling today. Pt understands.

## 2013-09-15 ENCOUNTER — Ambulatory Visit (HOSPITAL_COMMUNITY): Admission: RE | Admit: 2013-09-15 | Payer: Medicare Other | Source: Ambulatory Visit | Admitting: Surgery

## 2013-09-15 ENCOUNTER — Encounter (HOSPITAL_COMMUNITY): Admission: RE | Payer: Self-pay | Source: Ambulatory Visit

## 2013-09-15 SURGERY — REPAIR, HERNIA, VENTRAL, LAPAROSCOPIC
Anesthesia: General

## 2013-10-12 DIAGNOSIS — R7301 Impaired fasting glucose: Secondary | ICD-10-CM | POA: Diagnosis not present

## 2013-10-12 DIAGNOSIS — E785 Hyperlipidemia, unspecified: Secondary | ICD-10-CM | POA: Diagnosis not present

## 2013-10-12 DIAGNOSIS — I10 Essential (primary) hypertension: Secondary | ICD-10-CM | POA: Diagnosis not present

## 2013-10-12 DIAGNOSIS — E039 Hypothyroidism, unspecified: Secondary | ICD-10-CM | POA: Diagnosis not present

## 2013-10-12 DIAGNOSIS — M810 Age-related osteoporosis without current pathological fracture: Secondary | ICD-10-CM | POA: Diagnosis not present

## 2013-10-12 DIAGNOSIS — D649 Anemia, unspecified: Secondary | ICD-10-CM | POA: Diagnosis not present

## 2013-10-13 ENCOUNTER — Encounter: Payer: Self-pay | Admitting: Neurology

## 2013-10-13 ENCOUNTER — Ambulatory Visit (INDEPENDENT_AMBULATORY_CARE_PROVIDER_SITE_OTHER): Payer: Medicare Other | Admitting: Neurology

## 2013-10-13 VITALS — BP 118/62 | HR 80 | Resp 14 | Ht 62.0 in | Wt 150.0 lb

## 2013-10-13 DIAGNOSIS — G20A1 Parkinson's disease without dyskinesia, without mention of fluctuations: Secondary | ICD-10-CM

## 2013-10-13 DIAGNOSIS — G20 Parkinson's disease: Secondary | ICD-10-CM | POA: Diagnosis not present

## 2013-10-13 NOTE — Progress Notes (Signed)
[name] [name] was seen today in the movement disorders clinic for neurologic consultation at the request of [name],[name], MD.  The consultation is for the evaluation of PD.  She has previously seen [name] and [name].  I have some of [name] notes.  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  She had a normal MRI brain at that time according to notes from Dr. love.  In 2011, her diagnosis was changed to Parkinson's disease and Azilect was added.  She had a second opinion at [facility] on 07/25/2009 and [name] also thought that she had Parkinson's disease.  In January, 2014 [name] added Artane, presumably for tremor.  She does think that it has helped the tremor.  02/22/13 update:  The pt is f/u today re: PD.  After our discussion last visit, she decided to stop the artane and states that while she noted a little increase in tremor, it hasn't been bad.  I had wanted to start her on carbidopa/levodopa 25/100 but she wanted to hold on that last vist.  Balance is good but she has felt more slow and stiff.  No falls.  No hallucinations.  No n/v.  No near syncope.  Not exercising faithfully.  06/20/13 update:  The pt is f/u today re: PD.  She was started on carbidopa/levodopa 25/100 last visit in January in addition to the azilect that she was already on.  She just returned from a trip from the [place] and was so surprised that she could do all of the planned excursions.  She felt great that she could do all the hiking and could get in and out of the zodiac boat.  She states that she is so glad she started taking the levodopa and thinks that her mood is better.  Her coordination and tremor are better but she still has some tremor.  Some minor stomach upset.  Pharmacist told her to take it with food and she admits that she is taking it with protein.  She last took levodopa at 7:30 am and was examined at 10am.  She is having difficulty staying asleep.  10/13/13 update:  Pt is on carbidopa/levodopa 25/100  and is supposed to be on one tid but admits that she is only taking 1/2 po bid. Did this because of GI upset.   She is having more tremor especially when walking in the L hand; she is not sure if it is any particular time of day.  It doesn't bother her too much.  Some increased difficulty buttoning clothing.  Has a URI and is leaving for [place] on Friday.  She is going for 3 weeks.    She was hospitalized since last visit with CHF. I reviewed her MRI of the brain from April, 2009.  It was unremarkable.  Formal report is below:  Clinical Data: Syncopal episode. Confusion.  MRI HEAD WITHOUT CONTRAST  MRA HEAD WITHOUT CONTRAST  Technique: Multiplanar, multiecho pulse sequences of the brain and  surrounding structures were obtained according to standard protocol  with intravenous contrast. Angiographic images of the head were  obtained using MRA technique without contrast.  Comparison: No comparison MR. Prior CT 05/20/2007.  MRI HEAD  Findings: No acute infarct. No intracranial hemorrhage. No  hydrocephalous. No intracranial mass lesion detected on this  unenhanced exam. Major intracranial vascular structures are  patent. The ectatic vertebral arteries cause slight indentation  upon the medulla. Very minimal partial opacification mastoid air  cells.  IMPRESSION:  No acute infarct.  MRA HEAD  Findings: Ectasia of the distal vertical segment of the carotid  arteries bilaterally. Mild ectasia of the cavernous segment of the  internal carotid arteries bilaterally. Minimal narrowing proximal  A1 segment of the anterior cerebral arteries bilaterally.  Ectatic vertebral arteries and basilar artery. The AICAs are not  seen on either side. Remainder of major intracranial vascular  structures are patent. Minimal branch vessel irregularity. No  aneurysm visualized.  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  PREVIOUS MEDICATIONS: Azilect  and artane  ALLERGIES:   Allergies  Allergen Reactions  . Statins     Leg weakness - but tolerating low dose Crestor every other day    CURRENT MEDICATIONS:  Current Outpatient Prescriptions on File Prior to Visit  Medication Sig Dispense Refill  . B Complex Vitamins (B COMPLEX PO) Take 1 tablet by mouth daily.       [name] CALCIUM PO Take 1 tablet by mouth daily.       . Calcium Polycarbophil (FIBERCON PO) Take 1 tablet by mouth daily.       . carbidopa-levodopa (SINEMET) 25-100 MG per tablet Take 1 tablet by mouth 3 (three) times daily.  270 tablet  3  . clonazePAM (KLONOPIN) 0.5 MG tablet Take 1 tablet (0.5 mg total) by mouth at bedtime.  30 tablet  3  . Coenzyme Q10 (CO Q 10 PO) Take 1 tablet by mouth daily.       Mariane Baumgarten Sodium (COLACE PO) Take 1 capsule by mouth 2 (two) times daily.       [name] dofetilide (TIKOSYN) 250 MCG capsule Take 250 mcg by mouth 2 (two) times daily.      [name] ezetimibe (ZETIA) 10 MG tablet Take 10 mg by mouth every other day. Taking the same day as the crestor      . FORTEO 600 MCG/2.4ML SOLN       . furosemide (LASIX) 20 MG tablet Take 1 tablet (20 mg total) by mouth daily.  30 tablet  3  . losartan (COZAAR) 25 MG tablet TAKE 1 TABLET EVERY DAY  90 tablet  1  . LUTEIN PO Take 1 tablet by mouth daily.      [name] MAGNESIUM PO Take 1 tablet by mouth daily.       . Multiple Vitamin (MULTIVITAMIN PO) Take 1 tablet by mouth daily.       [name] NEXIUM 40 MG capsule Take 1 capsule by mouth daily.      . Omega-3 Fatty Acids (FISH OIL PO) Take 1 capsule by mouth daily.       . potassium chloride (K-DUR,KLOR-CON) 10 MEQ tablet Take 1 tablet (10 mEq total) by mouth daily.  30 tablet  3  . rasagiline (AZILECT) 1 MG TABS tablet Take 1 tablet (1 mg total) by mouth daily.  90 tablet  3  . Rivaroxaban (XARELTO) 20 MG TABS Take 20 mg by mouth daily.      . rosuvastatin (CRESTOR) 5 MG tablet Take 2.5 mg by mouth every other day.      . valACYclovir (VALTREX) 500 MG tablet Take 500 mg by  mouth daily as needed.      . Vitamin D, Ergocalciferol, (DRISDOL) 50000 UNITS CAPS Take 50,000 Units by mouth every 7 (seven) days.       . BD PEN NEEDLE NANO U/F 32G X 4 MM MISC        No current facility-administered medications  on file prior to visit.    PAST MEDICAL HISTORY:   Past Medical History  Diagnosis Date  . Hypertension   . Hyperlipidemia     a. Hx of leg weakness while on statin.  . Leg fracture Dec 21,2011  . Chronic anticoagulation     on Xarelto  . High risk medication use     on Flecainide  . GERD (gastroesophageal reflux disease)   . Parkinson's disease 2011  . Arthritis     lt thumb  . Osteoporosis 07/2007  . PAF (paroxysmal atrial fibrillation)     s/p atrial fib ablation 11-11-11.  restarted on Toprol & Flecainide  . Chronic diastolic CHF (congestive heart failure)     Echocardiogram (11/24/12): Mild LVH, EF 60%.  . Wrist fracture     right    PAST SURGICAL HISTORY:   Past Surgical History  Procedure Laterality Date  . Knee surgery Left     2  . US echocardiography  03-10-2008    Est EF 55-60%, Dr. Cathie Olden  . Cardiovascular stress test  06-06-2008    EF 73%  . Wrist surgery    . Cardioversion  09/09/2011    Procedure: CARDIOVERSION;  Surgeon: Darlin Coco, MD;  Location[facility] ENDOSCOPY;  Service: Cardiovascular;  Laterality: N/A;  to be done by dr. Mare Ferrari  . Tee without cardioversion  11/10/2011    Procedure: TRANSESOPHAGEAL ECHOCARDIOGRAM (TEE);  Surgeon: Thayer Headings, MD;  Location: [facility];  Service: Cardiovascular;  Laterality: N/A;  . Cardioversion  02/18/2012    Procedure: CARDIOVERSION;  Surgeon: Thayer Headings, MD;  Location: [facility];  Service: Cardiovascular;  Laterality: N/A;  . Cholecystectomy  1995  . Tonsillectomy    . Breast surgery Left     benign cyst removed  . Femur fracture surgery    . 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 Cardiac electrophysiology study and ablation  5/14, 7/14    SOCIAL HISTORY:    History   Social History  . Marital Status: Married    Spouse Name: N/A    Number of Children: N/A  . Years of Education: N/A   Occupational History  . Not on file.   Social History Main Topics  . Smoking status: Never Smoker   . Smokeless tobacco: Never Used  . Alcohol Use: 7.2 oz/week    12 Glasses of wine per week  . Drug Use: No  . Sexual Activity: Yes    Partners: Male    Birth Control/ Protection: Post-menopausal   Other Topics Concern  . Not on file   Social History Narrative   Lives in [place].  Retired Licensed conveyancer.    FAMILY HISTORY:   Family Status  Relation Status Death Age  . Mother Deceased 7    AD   . Father Deceased 90    Valve-rheumatic fever as child  . Child Alive     healthy  . Child Alive     healthy    ROS:  A complete 10 system review of systems was obtained and was unremarkable apart from what is mentioned above.  PHYSICAL EXAMINATION:    VITALS:   Filed Vitals:   10/13/13 0943  BP: 118/62  Pulse: 80  Resp: 14  Height: 5\' 2"  (1.575 m)  Weight: 150 lb (68.04 kg)    GEN:  The patient appears stated age and is in NAD. HEENT:  Normocephalic, atraumatic.  The mucous membranes are moist. The superficial temporal arteries  are without ropiness or tenderness. CV:  RRR.   Lungs:  CTAB Neck/HEME:  There are no carotid bruits bilaterally.  Neurological examination:  Orientation: The patient is alert and oriented x3. Fund of knowledge is appropriate.  Recent and remote memory are intact.  Attention and concentration are normal.    Able to name objects and repeat phrases.   Movement examination: Tone: There is mild to moderate rigidity in the right upper extremity. Abnormal movements: There is a mild, intermittent resting tremor of the right upper and lower extremity.  When she ambulates, she has a tremor of the bilateral upper tremor these. Coordination:  There is no decremation with RAMs today. Gait and Station: The patient has no  difficulty arising out of a deep-seated chair without the use of the hands. The patient's stride length is mildly decreased.  Tremor increases with ambulation.  The patient has a negative pull test.      ASSESSMENT/PLAN:  1.  idiopathic tremor predominant Parkinson's disease  -She was doing better last visit when she was taking carbidopa/levodopa 25/100, one tablet 3 times per day.  She has backed down to half a tablet twice per day and definitely has more stiffness and more tremor.  She had some GI upset when she was taking it more frequently.  I asked her to try and take the medication with a carbohydrate and try to slowly increase the dosage back up and see how she does.    -She will remain on the Azilect.  -Patient education was provided.  We discussed safety. 2.  Insomnia.  -She is on clonazepam. 3.  Follow up in the next few months, sooner should new neurologic issues arise.

## 2013-11-07 ENCOUNTER — Other Ambulatory Visit: Payer: Self-pay | Admitting: Neurology

## 2013-11-07 ENCOUNTER — Other Ambulatory Visit: Payer: Self-pay | Admitting: Cardiovascular Disease

## 2013-11-07 MED ORDER — RASAGILINE MESYLATE 1 MG PO TABS: 1.0000 mg | ORAL_TABLET | Freq: Every day | ORAL | Status: DC

## 2013-11-07 NOTE — Telephone Encounter (Signed)
Azilect refill requested. Per last office note- patient to remain on medication. Refill approved and sent to patient's pharmacy.

## 2013-11-08 DIAGNOSIS — Z45018 Encounter for adjustment and management of other part of cardiac pacemaker: Secondary | ICD-10-CM | POA: Diagnosis not present

## 2013-11-09 ENCOUNTER — Encounter: Payer: Self-pay | Admitting: Cardiovascular Disease

## 2013-11-09 DIAGNOSIS — E039 Hypothyroidism, unspecified: Secondary | ICD-10-CM | POA: Diagnosis not present

## 2013-11-09 DIAGNOSIS — K449 Diaphragmatic hernia without obstruction or gangrene: Secondary | ICD-10-CM | POA: Diagnosis not present

## 2013-11-09 DIAGNOSIS — D649 Anemia, unspecified: Secondary | ICD-10-CM | POA: Diagnosis not present

## 2013-11-09 DIAGNOSIS — Z23 Encounter for immunization: Secondary | ICD-10-CM | POA: Diagnosis not present

## 2013-11-09 DIAGNOSIS — R809 Proteinuria, unspecified: Secondary | ICD-10-CM | POA: Diagnosis not present

## 2013-11-09 DIAGNOSIS — H6121 Impacted cerumen, right ear: Secondary | ICD-10-CM | POA: Diagnosis not present

## 2013-11-09 DIAGNOSIS — R5383 Other fatigue: Secondary | ICD-10-CM | POA: Diagnosis not present

## 2013-11-09 DIAGNOSIS — I509 Heart failure, unspecified: Secondary | ICD-10-CM | POA: Diagnosis not present

## 2013-11-09 DIAGNOSIS — R7301 Impaired fasting glucose: Secondary | ICD-10-CM | POA: Diagnosis not present

## 2013-11-09 DIAGNOSIS — Z0000 Encounter for general adult medical examination without abnormal findings: Secondary | ICD-10-CM | POA: Diagnosis not present

## 2013-11-10 DIAGNOSIS — Z950 Presence of cardiac pacemaker: Secondary | ICD-10-CM | POA: Diagnosis not present

## 2013-11-11 ENCOUNTER — Other Ambulatory Visit: Payer: Self-pay | Admitting: Cardiovascular Disease

## 2013-11-11 ENCOUNTER — Other Ambulatory Visit (INDEPENDENT_AMBULATORY_CARE_PROVIDER_SITE_OTHER): Payer: Self-pay | Admitting: Surgery

## 2013-11-11 NOTE — Progress Notes (Signed)
Please put orders in Epic surgery 11-24-13 pre op 11-22-13 Thanks

## 2013-11-14 DIAGNOSIS — L570 Actinic keratosis: Secondary | ICD-10-CM | POA: Diagnosis not present

## 2013-11-14 DIAGNOSIS — Z85828 Personal history of other malignant neoplasm of skin: Secondary | ICD-10-CM | POA: Diagnosis not present

## 2013-11-15 ENCOUNTER — Encounter (HOSPITAL_COMMUNITY): Payer: Self-pay | Admitting: Pharmacy Technician

## 2013-11-18 ENCOUNTER — Other Ambulatory Visit (HOSPITAL_COMMUNITY): Payer: Self-pay | Admitting: *Deleted

## 2013-11-18 NOTE — Progress Notes (Signed)
CXR done 11/09/13 at [facility] on chart

## 2013-11-21 DIAGNOSIS — H2513 Age-related nuclear cataract, bilateral: Secondary | ICD-10-CM | POA: Diagnosis not present

## 2013-11-22 ENCOUNTER — Encounter (HOSPITAL_COMMUNITY)
Admission: RE | Admit: 2013-11-22 | Discharge: 2013-11-22 | Disposition: A | Discharge status: Home / Self Care | Payer: Medicare Other | Source: Ambulatory Visit | Attending: Surgery | Admitting: Surgery

## 2013-11-22 ENCOUNTER — Encounter (HOSPITAL_COMMUNITY): Payer: Self-pay

## 2013-11-22 DIAGNOSIS — Z85828 Personal history of other malignant neoplasm of skin: Secondary | ICD-10-CM | POA: Diagnosis not present

## 2013-11-22 DIAGNOSIS — Z888 Allergy status to other drugs, medicaments and biological substances status: Secondary | ICD-10-CM | POA: Diagnosis not present

## 2013-11-22 DIAGNOSIS — I10 Essential (primary) hypertension: Secondary | ICD-10-CM | POA: Diagnosis not present

## 2013-11-22 DIAGNOSIS — E785 Hyperlipidemia, unspecified: Secondary | ICD-10-CM | POA: Diagnosis not present

## 2013-11-22 DIAGNOSIS — M810 Age-related osteoporosis without current pathological fracture: Secondary | ICD-10-CM | POA: Diagnosis not present

## 2013-11-22 DIAGNOSIS — R011 Cardiac murmur, unspecified: Secondary | ICD-10-CM | POA: Diagnosis not present

## 2013-11-22 DIAGNOSIS — M13849 Other specified arthritis, unspecified hand: Secondary | ICD-10-CM | POA: Diagnosis not present

## 2013-11-22 DIAGNOSIS — G20 Parkinson's disease: Secondary | ICD-10-CM | POA: Diagnosis not present

## 2013-11-22 DIAGNOSIS — Z7901 Long term (current) use of anticoagulants: Secondary | ICD-10-CM | POA: Diagnosis not present

## 2013-11-22 DIAGNOSIS — K432 Incisional hernia without obstruction or gangrene: Secondary | ICD-10-CM | POA: Diagnosis not present

## 2013-11-22 DIAGNOSIS — K219 Gastro-esophageal reflux disease without esophagitis: Secondary | ICD-10-CM | POA: Diagnosis not present

## 2013-11-22 DIAGNOSIS — E039 Hypothyroidism, unspecified: Secondary | ICD-10-CM | POA: Diagnosis not present

## 2013-11-22 DIAGNOSIS — R0602 Shortness of breath: Secondary | ICD-10-CM | POA: Diagnosis not present

## 2013-11-22 DIAGNOSIS — I5032 Chronic diastolic (congestive) heart failure: Secondary | ICD-10-CM | POA: Diagnosis not present

## 2013-11-22 DIAGNOSIS — I480 Paroxysmal atrial fibrillation: Secondary | ICD-10-CM | POA: Diagnosis not present

## 2013-11-22 DIAGNOSIS — Z950 Presence of cardiac pacemaker: Secondary | ICD-10-CM | POA: Diagnosis not present

## 2013-11-22 HISTORY — DX: Reserved for concepts with insufficient information to code with codable children: IMO0002

## 2013-11-22 HISTORY — DX: Unspecified cataract: H26.9

## 2013-11-22 HISTORY — DX: Shortness of breath: R06.02

## 2013-11-22 HISTORY — DX: Hypothyroidism, unspecified: E03.9

## 2013-11-22 HISTORY — DX: Cardiac murmur, unspecified: R01.1

## 2013-11-22 LAB — CBC
HEMATOCRIT: 35.7 % — AB (ref 36.0–46.0)
Hemoglobin: 11.6 g/dL — ABNORMAL LOW (ref 12.0–15.0)
MCH: 29.4 pg (ref 26.0–34.0)
MCHC: 32.5 g/dL (ref 30.0–36.0)
MCV: 90.4 fL (ref 78.0–100.0)
Platelets: 189 10*3/uL (ref 150–400)
RBC: 3.95 MIL/uL (ref 3.87–5.11)
RDW: 14.6 % (ref 11.5–15.5)
WBC: 5.9 10*3/uL (ref 4.0–10.5)

## 2013-11-22 LAB — BASIC METABOLIC PANEL
ANION GAP: 11 (ref 5–15)
BUN: 17 mg/dL (ref 6–23)
CALCIUM: 9.6 mg/dL (ref 8.4–10.5)
CHLORIDE: 99 meq/L (ref 96–112)
CO2: 29 meq/L (ref 19–32)
Creatinine, Ser: 0.62 mg/dL (ref 0.50–1.10)
GFR calc non Af Amer: 89 mL/min — ABNORMAL LOW (ref >90)
Glucose, Bld: 113 mg/dL — ABNORMAL HIGH (ref 70–99)
Potassium: 4.2 mEq/L (ref 3.7–5.3)
SODIUM: 139 meq/L (ref 137–147)

## 2013-11-22 NOTE — Patient Instructions (Addendum)
Arnold Line  11/22/2013   Your procedure is scheduled on: Thursday 11/24/13  Report to [facility] at 07:45 AM.  Call this number if you have problems the morning of surgery [phone]   Remember:   Do not eat food or drink liquids After Midnight.     Take these medicines the morning of surgery with A SIP OF WATER: nexium, azilect, metoprolol, levothyroxine, tikosyn, sinemet   Do not wear jewelry, make-up or nail polish.  Do not wear lotions, powders, or perfumes.   Do not shave 48 hours prior to surgery. Men may shave face and neck.  Do not bring valuables to the hospital.  Contacts, dentures or bridgework may not be worn into surgery.  Leave suitcase in the car. After surgery it may be brought to your room    Paulette Blanch, RN  pre op nurse call if needed [phone]    [facility] - Preparing for Surgery Before surgery, you can play an important role.  Because skin is not sterile, your skin needs to be as free of germs as possible.  You can reduce the number of germs on your skin by washing with CHG (chlorahexidine gluconate) soap before surgery.  CHG is an antiseptic cleaner which kills germs and bonds with the skin to continue killing germs even after washing. Please DO NOT use if you have an allergy to CHG or antibacterial soaps.  If your skin becomes reddened/irritated stop using the CHG and inform your nurse when you arrive at Short Stay. Do not shave (including legs and underarms) for at least 48 hours prior to the first CHG shower.  You may shave your face/neck. Please follow these instructions carefully:  1.  Shower with CHG Soap the night before surgery and the  morning of Surgery.  2.  If you choose to wash your hair, wash your hair first as usual with your  normal  shampoo.  3.  After you shampoo, rinse your hair and body thoroughly to remove the  shampoo.                            4.  Use CHG as you would any other liquid soap.  You can  apply chg directly  to the skin and wash                       Gently with a scrungie or clean washcloth.  5.  Apply the CHG Soap to your body ONLY FROM THE NECK DOWN.   Do not use on face/ open                           Wound or open sores. Avoid contact with eyes, ears mouth and genitals (private parts).                       Wash face,  Genitals (private parts) with your normal soap.             6.  Wash thoroughly, paying special attention to the area where your surgery  will be performed.  7.  Thoroughly rinse your body with warm water from the neck down.  8.  DO NOT shower/wash with your normal soap after using and rinsing off  the CHG Soap.  9.  Pat yourself dry with a clean towel.            10.  Wear clean pajamas.            11.  Place clean sheets on your bed the night of your first shower and do not  sleep with pets. Day of Surgery : Do not apply any lotions/deodorants the morning of surgery.  Please wear clean clothes to the hospital/surgery center.  FAILURE TO FOLLOW THESE INSTRUCTIONS MAY RESULT IN THE CANCELLATION OF YOUR SURGERY PATIENT SIGNATURE_________________________________  NURSE SIGNATURE__________________________________  ________________________________________________________________________

## 2013-11-22 NOTE — Progress Notes (Addendum)
EKG 05/16/13 on EPIC, LOV note 06/23/13 Dr. Mervyn Skeeters on chart, remote pacemaker check 11/08/13 on chart, LOV note 06/10/13 Dr. Lehman Prom on chart, perioperative prescription for implanted cardiac device programming form on chart

## 2013-11-23 MED ORDER — GENTAMICIN SULFATE 40 MG/ML IJ SOLN
5.0000 mg/kg | INTRAVENOUS | Status: AC
  Administered 2013-11-24: 350 mg via INTRAVENOUS
  Filled 2013-11-23: qty 8.75

## 2013-11-23 MED ORDER — BUPIVACAINE 0.25 % ON-Q PUMP DUAL CATH 300 ML
300.0000 mL | INJECTION | Status: DC
  Filled 2013-11-23: qty 300

## 2013-11-23 NOTE — Anesthesia Preprocedure Evaluation (Signed)
Anesthesia Evaluation  Patient identified by MRN, date of birth, ID band Patient awake    Reviewed: Allergy & Precautions, H&P , NPO status , Patient's Chart, lab work & pertinent test results  History of Anesthesia Complications Negative for: history of anesthetic complications  Airway Mallampati: II TM Distance: >3 FB Neck ROM: Full    Dental no notable dental hx.    Pulmonary neg pulmonary ROS,  breath sounds clear to auscultation  Pulmonary exam normal       Cardiovascular hypertension, Pt. on medications and Pt. on home beta blockers + dysrhythmias Atrial Fibrillation + pacemaker Rhythm:Regular Rate:Normal     Neuro/Psych Parkinsons negative psych ROS   GI/Hepatic Neg liver ROS, GERD-  Medicated and Controlled,  Endo/Other  Hypothyroidism   Renal/GU negative Renal ROS  negative genitourinary   Musculoskeletal negative musculoskeletal ROS (+)   Abdominal   Peds negative pediatric ROS (+)  Hematology negative hematology ROS (+)   Anesthesia Other Findings   Reproductive/Obstetrics negative OB ROS                           Anesthesia Physical Anesthesia Plan  ASA: III  Anesthesia Plan: General   Post-op Pain Management:    Induction: Intravenous  Airway Management Planned: Oral ETT  Additional Equipment:   Intra-op Plan:   Post-operative Plan: Extubation in OR  Informed Consent: I have reviewed the patients History and Physical, chart, labs and discussed the procedure including the risks, benefits and alternatives for the proposed anesthesia with the patient or authorized representative who has indicated his/her understanding and acceptance.   Dental advisory given  Plan Discussed with: CRNA  Anesthesia Plan Comments:         Anesthesia Quick Evaluation

## 2013-11-24 ENCOUNTER — Encounter (HOSPITAL_COMMUNITY): Payer: Medicare Other | Admitting: Anesthesiology

## 2013-11-24 ENCOUNTER — Observation Stay (HOSPITAL_COMMUNITY)
Admission: RE | Admit: 2013-11-24 | Discharge: 2013-11-25 | Disposition: A | Discharge status: Home / Self Care | Payer: Medicare Other | Source: Ambulatory Visit | Attending: Surgery | Admitting: Surgery

## 2013-11-24 ENCOUNTER — Encounter (HOSPITAL_COMMUNITY)
Admission: RE | Disposition: A | Discharge status: Home / Self Care | Payer: Self-pay | Source: Ambulatory Visit | Attending: Surgery

## 2013-11-24 ENCOUNTER — Encounter (HOSPITAL_COMMUNITY): Payer: Self-pay | Admitting: *Deleted

## 2013-11-24 ENCOUNTER — Ambulatory Visit (HOSPITAL_COMMUNITY): Payer: Medicare Other | Admitting: Anesthesiology

## 2013-11-24 DIAGNOSIS — E785 Hyperlipidemia, unspecified: Secondary | ICD-10-CM | POA: Insufficient documentation

## 2013-11-24 DIAGNOSIS — M1990 Unspecified osteoarthritis, unspecified site: Secondary | ICD-10-CM | POA: Diagnosis not present

## 2013-11-24 DIAGNOSIS — Z7901 Long term (current) use of anticoagulants: Secondary | ICD-10-CM | POA: Insufficient documentation

## 2013-11-24 DIAGNOSIS — I5032 Chronic diastolic (congestive) heart failure: Secondary | ICD-10-CM | POA: Insufficient documentation

## 2013-11-24 DIAGNOSIS — I10 Essential (primary) hypertension: Secondary | ICD-10-CM | POA: Insufficient documentation

## 2013-11-24 DIAGNOSIS — M810 Age-related osteoporosis without current pathological fracture: Secondary | ICD-10-CM | POA: Insufficient documentation

## 2013-11-24 DIAGNOSIS — K219 Gastro-esophageal reflux disease without esophagitis: Secondary | ICD-10-CM | POA: Diagnosis not present

## 2013-11-24 DIAGNOSIS — Z950 Presence of cardiac pacemaker: Secondary | ICD-10-CM | POA: Insufficient documentation

## 2013-11-24 DIAGNOSIS — G20 Parkinson's disease: Secondary | ICD-10-CM | POA: Insufficient documentation

## 2013-11-24 DIAGNOSIS — Z85828 Personal history of other malignant neoplasm of skin: Secondary | ICD-10-CM | POA: Insufficient documentation

## 2013-11-24 DIAGNOSIS — K432 Incisional hernia without obstruction or gangrene: Secondary | ICD-10-CM | POA: Diagnosis not present

## 2013-11-24 DIAGNOSIS — I480 Paroxysmal atrial fibrillation: Secondary | ICD-10-CM | POA: Insufficient documentation

## 2013-11-24 DIAGNOSIS — E039 Hypothyroidism, unspecified: Secondary | ICD-10-CM | POA: Insufficient documentation

## 2013-11-24 DIAGNOSIS — K439 Ventral hernia without obstruction or gangrene: Secondary | ICD-10-CM | POA: Diagnosis present

## 2013-11-24 DIAGNOSIS — Z888 Allergy status to other drugs, medicaments and biological substances status: Secondary | ICD-10-CM | POA: Insufficient documentation

## 2013-11-24 DIAGNOSIS — R0602 Shortness of breath: Secondary | ICD-10-CM | POA: Insufficient documentation

## 2013-11-24 DIAGNOSIS — M13849 Other specified arthritis, unspecified hand: Secondary | ICD-10-CM | POA: Insufficient documentation

## 2013-11-24 DIAGNOSIS — R011 Cardiac murmur, unspecified: Secondary | ICD-10-CM | POA: Insufficient documentation

## 2013-11-24 HISTORY — PX: VENTRAL HERNIA REPAIR: SHX424

## 2013-11-24 HISTORY — PX: INSERTION OF MESH: SHX5868

## 2013-11-24 LAB — CBC
HCT: 31.9 % — ABNORMAL LOW (ref 36.0–46.0)
Hemoglobin: 10.6 g/dL — ABNORMAL LOW (ref 12.0–15.0)
MCH: 29.5 pg (ref 26.0–34.0)
MCHC: 33.2 g/dL (ref 30.0–36.0)
MCV: 88.9 fL (ref 78.0–100.0)
PLATELETS: 185 10*3/uL (ref 150–400)
RBC: 3.59 MIL/uL — AB (ref 3.87–5.11)
RDW: 14.4 % (ref 11.5–15.5)
WBC: 12 10*3/uL — AB (ref 4.0–10.5)

## 2013-11-24 LAB — CREATININE, SERUM
CREATININE: 0.64 mg/dL (ref 0.50–1.10)
GFR calc Af Amer: >90 mL/min (ref >90)
GFR calc non Af Amer: 88 mL/min — ABNORMAL LOW (ref >90)

## 2013-11-24 SURGERY — REPAIR, HERNIA, VENTRAL, LAPAROSCOPIC
Anesthesia: General

## 2013-11-24 MED ORDER — BUPIVACAINE-EPINEPHRINE 0.25% -1:200000 IJ SOLN
INTRAMUSCULAR | Status: DC | PRN
  Administered 2013-11-24: 60 mL

## 2013-11-24 MED ORDER — SODIUM CHLORIDE 0.9 % IJ SOLN
3.0000 mL | Freq: Two times a day (BID) | INTRAMUSCULAR | Status: DC
  Administered 2013-11-24: 3 mL via INTRAVENOUS

## 2013-11-24 MED ORDER — LIDOCAINE HCL (CARDIAC) 20 MG/ML IV SOLN
INTRAVENOUS | Status: DC | PRN
  Administered 2013-11-24: 75 mg via INTRAVENOUS

## 2013-11-24 MED ORDER — CALCIUM POLYCARBOPHIL 625 MG PO TABS
1250.0000 mg | ORAL_TABLET | Freq: Every day | ORAL | Status: DC
  Administered 2013-11-24 – 2013-11-25 (×2): 1250 mg via ORAL
  Filled 2013-11-24 (×2): qty 2

## 2013-11-24 MED ORDER — DOFETILIDE 250 MCG PO CAPS
250.0000 ug | ORAL_CAPSULE | Freq: Two times a day (BID) | ORAL | Status: DC
  Administered 2013-11-24 – 2013-11-25 (×2): 250 ug via ORAL
  Filled 2013-11-24 (×3): qty 1

## 2013-11-24 MED ORDER — METOPROLOL TARTRATE 12.5 MG HALF TABLET
12.5000 mg | ORAL_TABLET | Freq: Two times a day (BID) | ORAL | Status: DC | PRN
  Filled 2013-11-24: qty 1

## 2013-11-24 MED ORDER — ALUM & MAG HYDROXIDE-SIMETH 200-200-20 MG/5ML PO SUSP: 30.0000 mL | Freq: Four times a day (QID) | ORAL | Status: DC | PRN

## 2013-11-24 MED ORDER — SODIUM CHLORIDE 0.9 % IV SOLN
250.0000 mL | INTRAVENOUS | Status: DC | PRN
Start: 2013-11-24 — End: 2013-11-25

## 2013-11-24 MED ORDER — ONDANSETRON HCL 4 MG/2ML IJ SOLN
INTRAMUSCULAR | Status: AC
  Filled 2013-11-24: qty 2

## 2013-11-24 MED ORDER — MIDAZOLAM HCL 2 MG/2ML IJ SOLN
INTRAMUSCULAR | Status: AC
  Filled 2013-11-24: qty 2

## 2013-11-24 MED ORDER — METOPROLOL TARTRATE 1 MG/ML IV SOLN
5.0000 mg | Freq: Four times a day (QID) | INTRAVENOUS | Status: DC | PRN
  Filled 2013-11-24: qty 5

## 2013-11-24 MED ORDER — CEFAZOLIN SODIUM-DEXTROSE 2-3 GM-% IV SOLR
INTRAVENOUS | Status: AC
  Filled 2013-11-24: qty 50

## 2013-11-24 MED ORDER — PROPOFOL 10 MG/ML IV BOLUS
INTRAVENOUS | Status: AC
  Filled 2013-11-24: qty 20

## 2013-11-24 MED ORDER — SUCCINYLCHOLINE CHLORIDE 20 MG/ML IJ SOLN
INTRAMUSCULAR | Status: DC | PRN
  Administered 2013-11-24: 100 mg via INTRAVENOUS

## 2013-11-24 MED ORDER — VALACYCLOVIR HCL 500 MG PO TABS
500.0000 mg | ORAL_TABLET | Freq: Every day | ORAL | Status: DC | PRN
  Filled 2013-11-24: qty 1

## 2013-11-24 MED ORDER — ROSUVASTATIN CALCIUM 5 MG PO TABS
2.5000 mg | ORAL_TABLET | ORAL | Status: DC
  Administered 2013-11-24: 2.5 mg via ORAL
  Filled 2013-11-24: qty 0.5

## 2013-11-24 MED ORDER — CISATRACURIUM BESYLATE 20 MG/10ML IV SOLN
INTRAVENOUS | Status: AC
  Filled 2013-11-24: qty 10

## 2013-11-24 MED ORDER — POLYETHYLENE GLYCOL 3350 17 G PO PACK
17.0000 g | PACK | Freq: Two times a day (BID) | ORAL | Status: DC | PRN
  Filled 2013-11-24: qty 1

## 2013-11-24 MED ORDER — HYDROMORPHONE HCL 1 MG/ML IJ SOLN
0.2500 mg | INTRAMUSCULAR | Status: DC | PRN
  Administered 2013-11-24: 0.5 mg via INTRAVENOUS

## 2013-11-24 MED ORDER — PROPOFOL 10 MG/ML IV BOLUS
INTRAVENOUS | Status: DC | PRN
  Administered 2013-11-24: 100 mg via INTRAVENOUS

## 2013-11-24 MED ORDER — CEFAZOLIN SODIUM-DEXTROSE 2-3 GM-% IV SOLR
2.0000 g | INTRAVENOUS | Status: AC
  Administered 2013-11-24: 2 g via INTRAVENOUS

## 2013-11-24 MED ORDER — SALINE SPRAY 0.65 % NA SOLN
2.0000 | Freq: Every day | NASAL | Status: DC | PRN
  Filled 2013-11-24: qty 44

## 2013-11-24 MED ORDER — MAGIC MOUTHWASH
15.0000 mL | Freq: Four times a day (QID) | ORAL | Status: DC | PRN
  Filled 2013-11-24: qty 15

## 2013-11-24 MED ORDER — OXYCODONE HCL 5 MG PO TABS: 5.0000 mg | ORAL_TABLET | Freq: Four times a day (QID) | ORAL | Status: DC | PRN

## 2013-11-24 MED ORDER — CLONAZEPAM 0.5 MG PO TABS
0.5000 mg | ORAL_TABLET | Freq: Every day | ORAL | Status: DC
  Administered 2013-11-24: 0.5 mg via ORAL
  Filled 2013-11-24: qty 1

## 2013-11-24 MED ORDER — METOPROLOL TARTRATE 25 MG PO TABS
25.0000 mg | ORAL_TABLET | Freq: Two times a day (BID) | ORAL | Status: DC
  Administered 2013-11-24 – 2013-11-25 (×2): 25 mg via ORAL
  Filled 2013-11-24 (×3): qty 1

## 2013-11-24 MED ORDER — NEOSTIGMINE METHYLSULFATE 10 MG/10ML IV SOLN
INTRAVENOUS | Status: DC | PRN
  Administered 2013-11-24: 1 mg via INTRAVENOUS

## 2013-11-24 MED ORDER — DEXAMETHASONE SODIUM PHOSPHATE 10 MG/ML IJ SOLN
INTRAMUSCULAR | Status: AC
  Filled 2013-11-24: qty 1

## 2013-11-24 MED ORDER — ACETAMINOPHEN 500 MG PO TABS
1000.0000 mg | ORAL_TABLET | Freq: Three times a day (TID) | ORAL | Status: DC
  Administered 2013-11-24 (×2): 1000 mg via ORAL
  Filled 2013-11-24 (×3): qty 2

## 2013-11-24 MED ORDER — BUPIVACAINE 0.25 % ON-Q PUMP DUAL CATH 300 ML
INJECTION | Status: DC | PRN
  Administered 2013-11-24: 300 mL

## 2013-11-24 MED ORDER — NEOSTIGMINE METHYLSULFATE 10 MG/10ML IV SOLN
INTRAVENOUS | Status: AC
  Filled 2013-11-24: qty 1

## 2013-11-24 MED ORDER — EPHEDRINE SULFATE 50 MG/ML IJ SOLN
INTRAMUSCULAR | Status: DC | PRN
  Administered 2013-11-24: 5 mg via INTRAVENOUS

## 2013-11-24 MED ORDER — PHENYLEPHRINE HCL 10 MG/ML IJ SOLN
INTRAMUSCULAR | Status: DC | PRN
  Administered 2013-11-24: 40 ug via INTRAVENOUS

## 2013-11-24 MED ORDER — BISACODYL 10 MG RE SUPP: 10.0000 mg | Freq: Two times a day (BID) | RECTAL | Status: DC | PRN

## 2013-11-24 MED ORDER — GLYCOPYRROLATE 0.2 MG/ML IJ SOLN
INTRAMUSCULAR | Status: AC
  Filled 2013-11-24: qty 3

## 2013-11-24 MED ORDER — DOCUSATE SODIUM 100 MG PO CAPS
200.0000 mg | ORAL_CAPSULE | Freq: Every day | ORAL | Status: DC
  Administered 2013-11-24 – 2013-11-25 (×2): 200 mg via ORAL
  Filled 2013-11-24 (×2): qty 2

## 2013-11-24 MED ORDER — LACTATED RINGERS IV SOLN
INTRAVENOUS | Status: DC | PRN
  Administered 2013-11-24 (×3): via INTRAVENOUS

## 2013-11-24 MED ORDER — RIVAROXABAN 20 MG PO TABS
20.0000 mg | ORAL_TABLET | Freq: Every day | ORAL | Status: DC
  Filled 2013-11-24: qty 1

## 2013-11-24 MED ORDER — ONDANSETRON HCL 4 MG/2ML IJ SOLN
4.0000 mg | Freq: Four times a day (QID) | INTRAMUSCULAR | Status: DC | PRN
  Administered 2013-11-25: 4 mg via INTRAVENOUS
  Filled 2013-11-24: qty 2

## 2013-11-24 MED ORDER — DIPHENHYDRAMINE HCL 50 MG/ML IJ SOLN: 12.5000 mg | Freq: Four times a day (QID) | INTRAMUSCULAR | Status: DC | PRN

## 2013-11-24 MED ORDER — PHENOL 1.4 % MT LIQD
2.0000 | OROMUCOSAL | Status: DC | PRN
  Filled 2013-11-24: qty 177

## 2013-11-24 MED ORDER — LEVOTHYROXINE SODIUM 50 MCG PO TABS
50.0000 ug | ORAL_TABLET | Freq: Every day | ORAL | Status: DC
  Filled 2013-11-24 (×2): qty 1

## 2013-11-24 MED ORDER — SODIUM CHLORIDE 0.9 % IJ SOLN
3.0000 mL | INTRAMUSCULAR | Status: DC | PRN
Start: 2013-11-24 — End: 2013-11-25

## 2013-11-24 MED ORDER — PHENYLEPHRINE 40 MCG/ML (10ML) SYRINGE FOR IV PUSH (FOR BLOOD PRESSURE SUPPORT)
PREFILLED_SYRINGE | INTRAVENOUS | Status: AC
  Filled 2013-11-24: qty 20

## 2013-11-24 MED ORDER — FENTANYL CITRATE 0.05 MG/ML IJ SOLN
INTRAMUSCULAR | Status: DC | PRN
  Administered 2013-11-24 (×6): 50 ug via INTRAVENOUS

## 2013-11-24 MED ORDER — FENTANYL CITRATE 0.05 MG/ML IJ SOLN
INTRAMUSCULAR | Status: AC
  Filled 2013-11-24: qty 5

## 2013-11-24 MED ORDER — PROMETHAZINE HCL 25 MG/ML IJ SOLN: 6.2500 mg | INTRAMUSCULAR | Status: DC | PRN

## 2013-11-24 MED ORDER — ACETAMINOPHEN 500 MG PO TABS: 1000.0000 mg | ORAL_TABLET | Freq: Four times a day (QID) | ORAL | Status: DC | PRN

## 2013-11-24 MED ORDER — EZETIMIBE 10 MG PO TABS
10.0000 mg | ORAL_TABLET | ORAL | Status: DC
  Administered 2013-11-24: 10 mg via ORAL
  Filled 2013-11-24: qty 1

## 2013-11-24 MED ORDER — FUROSEMIDE 20 MG PO TABS
20.0000 mg | ORAL_TABLET | Freq: Every day | ORAL | Status: DC
  Administered 2013-11-24 – 2013-11-25 (×2): 20 mg via ORAL
  Filled 2013-11-24 (×2): qty 1

## 2013-11-24 MED ORDER — HEPARIN SODIUM (PORCINE) 5000 UNIT/ML IJ SOLN
5000.0000 [IU] | Freq: Three times a day (TID) | INTRAMUSCULAR | Status: AC
  Administered 2013-11-25 (×2): 5000 [IU] via SUBCUTANEOUS
  Filled 2013-11-24 (×3): qty 1

## 2013-11-24 MED ORDER — CISATRACURIUM BESYLATE (PF) 10 MG/5ML IV SOLN
INTRAVENOUS | Status: DC | PRN
  Administered 2013-11-24: 4 mg via INTRAVENOUS
  Administered 2013-11-24: 8 mg via INTRAVENOUS

## 2013-11-24 MED ORDER — BUPIVACAINE-EPINEPHRINE (PF) 0.25% -1:200000 IJ SOLN
INTRAMUSCULAR | Status: AC
  Filled 2013-11-24: qty 60

## 2013-11-24 MED ORDER — ONDANSETRON HCL 4 MG PO TABS: 4.0000 mg | ORAL_TABLET | Freq: Four times a day (QID) | ORAL | Status: DC | PRN

## 2013-11-24 MED ORDER — POTASSIUM CHLORIDE CRYS ER 10 MEQ PO TBCR
10.0000 meq | EXTENDED_RELEASE_TABLET | Freq: Every day | ORAL | Status: DC
  Administered 2013-11-24 – 2013-11-25 (×2): 10 meq via ORAL
  Filled 2013-11-24 (×2): qty 1

## 2013-11-24 MED ORDER — CEFAZOLIN SODIUM 1-5 GM-% IV SOLN
1.0000 g | Freq: Four times a day (QID) | INTRAVENOUS | Status: AC
  Administered 2013-11-24 – 2013-11-25 (×3): 1 g via INTRAVENOUS
  Filled 2013-11-24 (×3): qty 50

## 2013-11-24 MED ORDER — MENTHOL 3 MG MT LOZG
1.0000 | LOZENGE | OROMUCOSAL | Status: DC | PRN
Start: 2013-11-24 — End: 2013-11-25
  Filled 2013-11-24: qty 9

## 2013-11-24 MED ORDER — LIDOCAINE HCL (CARDIAC) 20 MG/ML IV SOLN
INTRAVENOUS | Status: AC
  Filled 2013-11-24: qty 5

## 2013-11-24 MED ORDER — HYDROMORPHONE HCL 1 MG/ML IJ SOLN
INTRAMUSCULAR | Status: AC
Start: 2013-11-24 — End: 2013-11-25
  Filled 2013-11-24: qty 1

## 2013-11-24 MED ORDER — LACTATED RINGERS IR SOLN
Status: DC | PRN
  Administered 2013-11-24: 1000 mL

## 2013-11-24 MED ORDER — ONDANSETRON HCL 4 MG/2ML IJ SOLN
INTRAMUSCULAR | Status: DC | PRN
  Administered 2013-11-24 (×2): 2 mg via INTRAVENOUS

## 2013-11-24 MED ORDER — OXYCODONE HCL 5 MG PO TABS
5.0000 mg | ORAL_TABLET | ORAL | Status: DC | PRN
  Administered 2013-11-24: 5 mg via ORAL
  Administered 2013-11-25: 10 mg via ORAL
  Filled 2013-11-24: qty 1
  Filled 2013-11-24: qty 2

## 2013-11-24 MED ORDER — LIP MEDEX EX OINT
1.0000 | TOPICAL_OINTMENT | Freq: Two times a day (BID) | CUTANEOUS | Status: DC
Start: 2013-11-24 — End: 2013-11-25
  Administered 2013-11-24 – 2013-11-25 (×2): 1 via TOPICAL

## 2013-11-24 MED ORDER — ACETAMINOPHEN 10 MG/ML IV SOLN
1000.0000 mg | Freq: Once | INTRAVENOUS | Status: AC
  Administered 2013-11-24: 1000 mg via INTRAVENOUS
  Filled 2013-11-24: qty 100

## 2013-11-24 MED ORDER — BUPIVACAINE 0.25 % ON-Q PUMP DUAL CATH 300 ML
300.0000 mL | INJECTION | Status: DC
  Filled 2013-11-24: qty 300

## 2013-11-24 MED ORDER — GLYCOPYRROLATE 0.2 MG/ML IJ SOLN
INTRAMUSCULAR | Status: DC | PRN
  Administered 2013-11-24: .2 mg via INTRAVENOUS

## 2013-11-24 MED ORDER — NEOSTIGMINE METHYLSULFATE 10 MG/10ML IV SOLN
INTRAVENOUS | Status: AC
Start: 2013-11-24 — End: 2013-11-24
  Filled 2013-11-24: qty 1

## 2013-11-24 MED ORDER — DEXAMETHASONE SODIUM PHOSPHATE 10 MG/ML IJ SOLN
INTRAMUSCULAR | Status: DC | PRN
  Administered 2013-11-24: 10 mg via INTRAVENOUS

## 2013-11-24 MED ORDER — 0.9 % SODIUM CHLORIDE (POUR BTL) OPTIME
TOPICAL | Status: DC | PRN
  Administered 2013-11-24: 1000 mL

## 2013-11-24 MED ORDER — HYDROMORPHONE HCL 1 MG/ML IJ SOLN
0.5000 mg | INTRAMUSCULAR | Status: DC | PRN
  Administered 2013-11-24 – 2013-11-25 (×2): 1 mg via INTRAVENOUS
  Filled 2013-11-24 (×3): qty 1

## 2013-11-24 MED ORDER — LACTATED RINGERS IV BOLUS (SEPSIS): 1000.0000 mL | Freq: Three times a day (TID) | INTRAVENOUS | Status: DC | PRN

## 2013-11-24 MED ORDER — LACTATED RINGERS IV SOLN
INTRAVENOUS | Status: DC
  Administered 2013-11-24: via INTRAVENOUS

## 2013-11-24 MED ORDER — ONDANSETRON HCL 4 MG/2ML IJ SOLN: 4.0000 mg | Freq: Once | INTRAMUSCULAR | Status: DC | PRN

## 2013-11-24 MED ORDER — FENTANYL CITRATE 0.05 MG/ML IJ SOLN: 25.0000 ug | INTRAMUSCULAR | Status: DC | PRN

## 2013-11-24 MED ORDER — LACTATED RINGERS IV SOLN
INTRAVENOUS | Status: DC
  Administered 2013-11-24: 1000 mL via INTRAVENOUS

## 2013-11-24 MED ORDER — RASAGILINE MESYLATE 1 MG PO TABS
1.0000 mg | ORAL_TABLET | Freq: Every day | ORAL | Status: DC
  Filled 2013-11-24 (×2): qty 1

## 2013-11-24 MED ORDER — MIDAZOLAM HCL 5 MG/5ML IJ SOLN
INTRAMUSCULAR | Status: DC | PRN
  Administered 2013-11-24 (×3): 0.5 mg via INTRAVENOUS

## 2013-11-24 SURGICAL SUPPLY — 43 items
APPLIER CLIP 5 13 M/L LIGAMAX5 (MISCELLANEOUS)
BINDER ABDOMINAL 12 ML 46-62 (SOFTGOODS) ×3 IMPLANT
CABLE HI FREQUENCY MONOPOLAR (ELECTROSURGICAL) ×3 IMPLANT
CATH KIT ON Q 5IN DUAL SLV (PAIN MANAGEMENT) ×6 IMPLANT
CATH KIT ON Q 7.5IN SLV (PAIN MANAGEMENT) IMPLANT
CHLORAPREP W/TINT 26ML (MISCELLANEOUS) ×3 IMPLANT
CLIP APPLIE 5 13 M/L LIGAMAX5 (MISCELLANEOUS) IMPLANT
CLOSURE WOUND 1/2 X4 (GAUZE/BANDAGES/DRESSINGS) ×2
DECANTER SPIKE VIAL GLASS SM (MISCELLANEOUS) ×3 IMPLANT
DEVICE SECURE STRAP 25 ABSORB (INSTRUMENTS) ×3 IMPLANT
DEVICE TROCAR PUNCTURE CLOSURE (ENDOMECHANICALS) ×3 IMPLANT
DRAPE LAPAROSCOPIC ABDOMINAL (DRAPES) ×3 IMPLANT
DRAPE WARM FLUID 44X44 (DRAPE) ×3 IMPLANT
DRSG TEGADERM 2-3/8X2-3/4 SM (GAUZE/BANDAGES/DRESSINGS) ×3 IMPLANT
DRSG TEGADERM 4X4.75 (GAUZE/BANDAGES/DRESSINGS) ×9 IMPLANT
ELECT PENCIL ROCKER SW 15FT (MISCELLANEOUS) ×3 IMPLANT
ELECT REM PT RETURN 9FT ADLT (ELECTROSURGICAL) ×3
ELECTRODE REM PT RTRN 9FT ADLT (ELECTROSURGICAL) ×1 IMPLANT
GAUZE SPONGE 2X2 8PLY STRL LF (GAUZE/BANDAGES/DRESSINGS) ×1 IMPLANT
GLOVE BIO SURGEON STRL SZ7 (GLOVE) ×3 IMPLANT
GLOVE ECLIPSE 8.0 STRL XLNG CF (GLOVE) ×3 IMPLANT
GLOVE INDICATOR 8.0 STRL GRN (GLOVE) ×3 IMPLANT
GOWN STRL REUS W/TWL XL LVL3 (GOWN DISPOSABLE) ×6 IMPLANT
KIT BASIN OR (CUSTOM PROCEDURE TRAY) ×3 IMPLANT
MESH VENTRALIGHT ST 7X9N (Mesh General) ×3 IMPLANT
NEEDLE SPNL 22GX3.5 QUINCKE BK (NEEDLE) IMPLANT
PEN SKIN MARKING BROAD (MISCELLANEOUS) ×3 IMPLANT
SCISSORS LAP 5X35 DISP (ENDOMECHANICALS) ×3 IMPLANT
SET IRRIG TUBING LAPAROSCOPIC (IRRIGATION / IRRIGATOR) ×6 IMPLANT
SHEARS HARMONIC ACE PLUS 36CM (ENDOMECHANICALS) IMPLANT
SLEEVE XCEL OPT CAN 5 100 (ENDOMECHANICALS) ×6 IMPLANT
SPONGE GAUZE 2X2 STER 10/PKG (GAUZE/BANDAGES/DRESSINGS) ×2
STRIP CLOSURE SKIN 1/2X4 (GAUZE/BANDAGES/DRESSINGS) ×4 IMPLANT
SUT MNCRL AB 4-0 PS2 18 (SUTURE) ×3 IMPLANT
SUT PDS AB 1 CT1 27 (SUTURE) ×15 IMPLANT
SUT PROLENE 1 CT 1 30 (SUTURE) ×24 IMPLANT
TAPE STRIPS DRAPE STRL (GAUZE/BANDAGES/DRESSINGS) ×3 IMPLANT
TOWEL OR 17X26 10 PK STRL BLUE (TOWEL DISPOSABLE) ×3 IMPLANT
TRAY FOLEY CATH 14FRSI W/METER (CATHETERS) IMPLANT
TRAY LAPAROSCOPIC (CUSTOM PROCEDURE TRAY) ×3 IMPLANT
TROCAR BLADELESS OPT 5 100 (ENDOMECHANICALS) ×3 IMPLANT
TROCAR XCEL NON-BLD 11X100MML (ENDOMECHANICALS) IMPLANT
TUNNELER SHEATH ON-Q 16GX12 DP (PAIN MANAGEMENT) ×3 IMPLANT

## 2013-11-24 NOTE — Transfer of Care (Signed)
Immediate Anesthesia Transfer of Care Note  Patient: [name] [name]  Procedure(s) Performed: Procedure(s): LAPAROSCOPIC VENTRAL HERNIA (N/A) INSERTION OF MESH (N/A)  Patient Location: PACU  Anesthesia Type:General  Level of Consciousness: awake, oriented, patient cooperative, lethargic and responds to stimulation  Airway & Oxygen Therapy: Patient Spontanous Breathing and Patient connected to face mask oxygen  Post-op Assessment: Report given to PACU RN, Post -op Vital signs reviewed and stable and Patient moving all extremities  Post vital signs: Reviewed and stable  Complications: No apparent anesthesia complications

## 2013-11-24 NOTE — Anesthesia Procedure Notes (Signed)
Procedure Name: Intubation Date/Time: 11/24/2013 10:27 AM Performed by: Ofilia Neas Pre-anesthesia Checklist: Patient identified, Timeout performed, Emergency Drugs available, Suction available and Patient being monitored Patient Re-evaluated:Patient Re-evaluated prior to inductionOxygen Delivery Method: Circle system utilized Preoxygenation: Pre-oxygenation with 100% oxygen Intubation Type: IV induction Ventilation: Mask ventilation without difficulty Laryngoscope Size: Mac and 4 Grade View: Grade I Tube type: Oral Tube size: 7.0 mm Number of attempts: 1 Airway Equipment and Method: Stylet Placement Confirmation: ETT inserted through vocal cords under direct vision,  positive ETCO2 and breath sounds checked- equal and bilateral Secured at: 21 cm Tube secured with: Tape Dental Injury: Teeth and Oropharynx as per pre-operative assessment

## 2013-11-24 NOTE — Op Note (Signed)
11/24/2013  1:04 PM  PATIENT:  [name] [name]  73 y.o. female  Patient Care Team: Jerlyn Ly, MD as PCP - General (Internal Medicine) Thayer Headings, MD as Consulting Physician (Cardiology) Carney Bern, MD as Referring Physician (Cardiothoracic Surgery) Geanie Cooley, MD as Consulting Physician (Internal Medicine)  PRE-OPERATIVE DIAGNOSIS:  subxiphoid incisional abdominal wall hernia  POST-OPERATIVE DIAGNOSIS:  subxiphoid incisional abdominal wall hernia  PROCEDURE:  Procedure(s): LAPAROSCOPIC INCISIONAL VENTRAL HERNIA REPAIR INSERTION OF MESH  SURGEON:  Surgeon(s): Michael Boston, MD  ASSISTANT: RN   ANESTHESIA:   local and general  EBL:  Total I/O In: 1300 [I.V.:1300] Out: -   Delay start of Pharmacological VTE agent (>24hrs) due to surgical blood loss or risk of bleeding:  no  DRAINS: none   SPECIMEN:  No Specimen  DISPOSITION OF SPECIMEN:  N/A  COUNTS:  YES  PLAN OF CARE: Admit for overnight observation  PATIENT DISPOSITION:  PACU - hemodynamically stable.  INDICATION: Pleasant patient has developed an incisional ventral wall abdominal hernia.   Recommendation was made for surgical repair:  The anatomy & physiology of the abdominal wall was discussed. The pathophysiology of hernias was discussed. Natural history risks without surgery including progeressive enlargement, pain, incarceration & strangulation was discussed. Contributors to complications such as smoking, obesity, diabetes, prior surgery, etc were discussed.  I feel the risks of no intervention will lead to serious problems that outweigh the operative risks; therefore, I recommended surgery to reduce and repair the hernia. I explained laparoscopic techniques with possible need for an open approach. I noted the probable use of mesh to patch and/or buttress the hernia repair  Risks such as bleeding, infection, abscess, need for further treatment, heart attack, death, and other risks were  discussed. I noted a good likelihood this will help address the problem. Goals of post-operative recovery were discussed as well. Possibility that this will not correct all symptoms was explained. I stressed the importance of low-impact activity, aggressive pain control, avoiding constipation, & not pushing through pain to minimize risk of post-operative chronic pain or injury. Possibility of reherniation especially with smoking, obesity, diabetes, immunosuppression, and other health conditions was discussed. We will work to minimize complications.  An educational handout further explaining the pathology & treatment options was given as well. Questions were answered. The patient expresses understanding & wishes to proceed with surgery.   OR FINDINGS: 7x6cm region of epigastric ventral wall hernias   Type of repair - Laparoscopic underlay repair with primary repair   Name of mesh -  Bard Ventralight dual sided (polypropylene / Seprafilm)  Size of mesh - Length 23 cm, Width 17 cm  Mesh overlap - 5-7 cm  Placement of mesh - Intraperitoneal underlay repair   DESCRIPTION:   Informed consent was confirmed. The patient underwent general anaesthesia without difficulty. The patient was positioned appropriately. VTE prevention in place. The patient's abdomen was clipped, prepped, & draped in a sterile fashion. Surgical timeout confirmed our plan.  The patient was positioned in reverse Trendelenburg. Abdominal entry was gained using optical entry technique in the left upper abdomen. Entry was clean. I induced carbon dioxide insufflation. Camera inspection revealed no injury. Extra ports were carefully placed under direct laparoscopic visualization.   I could see the hernias in the subxiphoid abdomen.  Swiss cheese region of 7 x 6 cm.  Largest 6 x 4 cm.  Laparoscopically freed the peritoneum and hernia sac out of the hernias.  I primarily used and focused cold scissors.  I made sure hemostasis was good.   I mapped out the region using a needle passer.   To ensure that I would have at least 5 cm radial coverage outside of the hernia defect, I chose a 23x17 cm dual sided mesh.  I placed #1 Prolene stitches around its inferior edge about every 5 cm = 8 total.  I rolled the mesh & placed into the peritoneal cavity through the LUQ port 10 cm fascial defect.  I unrolled the mesh and positioned it appropriately.  I secured the mesh to cover up the hernia defect using a laparoscopic suture passer to pass the tails of the Prolene through the abdominal wall & tagged them with clamps.  I started out in the lateral & inferior corners to make sure I had the mesh centered over the hernia defect appropriately, and then proceeded to work in quadrants.  We evacuated CO2 & desufflated the abdomen.  I tied the fascial stitches down.  I reinsufflated the abdomen.  The mesh provided at least 5-10 cm circumferential coverage around the entire region of hernia defects.   I secured the mesh centrally using interrupted Prolene sutures at 4 corners around the defect and one at the apex just below the xiphoid.  I tacked the edges & central part of the mesh to the peritoneum/posterior rectus fascia with SecureStrap absorbable tacks.   He is tacker to help replace the peritoneum & hernia sac under two thirds of the mesh.  Hemostasis was excellent.  I closed the fascia port site on the 10 mm port using a 0 Vicryl stitch. I then placed On-Q catheter sheaths in the preperitoneal plane under direct laparoscopic guidance from the subxiphoid region down to the posterior iliac crests in the lower flanks. I did reinspection. Hemostasis was good. Mesh laid well. Capnoperitoneum was evacuated. Ports were removed. I made a small subxiphoid incision through old incision and primarily closed the largest fascial defect with interrupted #1 PDS sutures transversely since the defect was more transverse than vertical.  The skin was closed with Monocryl at the  port sites and Steri-Strips on the fascial stitch puncture sites. OnQ catheters were placed and the sheathes peeled away. On-Q pump was secured. Patient is being extubated to go to the recovery room. I'm about to discuss operative findings with the patient's family.  Instructions are written.  Adin Hector, M.D., F.A.C.S. Gastrointestinal and Minimally Invasive Surgery [facility] [address], [place], [place] [address] [phone] Main / Paging

## 2013-11-24 NOTE — H&P (Signed)
[facility]  [address] [place], [place] [address] Phone: [phone] FAX: [email or web address]  April 26, 1940 [id number]  CARE TEAM:  PCP: Jerlyn Ly, MD  Outpatient Care Team: Patient Care Team: Jerlyn Ly, MD as PCP - General (Internal Medicine) Thayer Headings, MD as Consulting Physician (Cardiology) Carney Bern, MD as Referring Physician (Cardiothoracic Surgery) Geanie Cooley, MD as Consulting Physician (Internal Medicine)  Inpatient Treatment Team: Treatment Team: Attending Provider: Michael Boston, MD  This patient is a 73 y.o.female who presents today for surgical evaluation at the request of Dr Domenic Moras.   Reason for evaluation: Epigastric [place]  Pleasant female with chronic atrial fibrillation. Underwent attempted atrial ablation through an epigastric ventral incision. It seemed to initially make a marked improvement. However she does have some breakthrough atrial fibrillation. Perhaps related to a high altitude vacation. She is back on anticoagulation. She is noted a bulging at the incision just below her breast bone in the upper abdomen. It has gotten larger. Concern for incisional hernia. Surgical consultation requested. I saw her in the summer.  Noted a subxiphoid incisional hernia.  I felt she could benefit from repair of hernia with mesh.  She returns from summer travelling to proceed with surgery.   She is quite active. Walks about an hour a day. Has a bowel movement every day. Denies any history of MRSA or infections. Likes to travel. Due to go to [place] in a few months. [place] the [place].   Past Medical History  Diagnosis Date  . Hypertension   . Hyperlipidemia     a. Hx of leg weakness while on statin. under control  . Leg fracture Dec 21,2011  . Chronic anticoagulation     on Xarelto  . GERD (gastroesophageal reflux disease)   . Parkinson's disease 2011  .  Osteoporosis 07/2007  . PAF (paroxysmal atrial fibrillation)     s/p atrial fib ablation 11-11-11.   . Wrist fracture     right  . Chronic diastolic CHF (congestive heart failure)     Echocardiogram (11/24/12): Mild LVH, EF 60%.  . Heart murmur     slight  . Pacemaker   . Hypothyroidism   . Shortness of breath     "when walking at incline or stairs"  . Arthritis     thumbs  . Squamous carcinoma summer 2015    back of left thigh  . Cataract     bilateral    Past Surgical History  Procedure Laterality Date  . Knee surgery Left 2001  . US echocardiography  03-10-2008    Est EF 55-60%, Dr. Cathie Olden  . Cardiovascular stress test  06-06-2008    EF 73%  . Wrist surgery Right ~2009    metal rod in place  . Cardioversion  09/09/2011    Procedure: CARDIOVERSION;  Surgeon: Darlin Coco, MD;  Location: [facility] ENDOSCOPY;  Service: Cardiovascular;  Laterality: N/A;  to be done by dr. Mare Ferrari  . Tee without cardioversion  11/10/2011    Procedure: TRANSESOPHAGEAL ECHOCARDIOGRAM (TEE);  Surgeon: Thayer Headings, MD;  Location: [place];  Service: Cardiovascular;  Laterality: N/A;  . Cardioversion  02/18/2012    Procedure: CARDIOVERSION;  Surgeon: Thayer Headings, MD;  Location: [place];  Service: Cardiovascular;  Laterality: N/A;  . Cholecystectomy  1995  . Femur fracture surgery  2011    metal pin in place  . 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 Cardiac electrophysiology study and ablation  5/14, 7/14  . Skin cancer destruction  summer 2015  . Breast surgery      benign cyst removed  . Forearm surgery Left 2010    "opened arm to drain it"  . Tonsillectomy  as child    History   Social History  . Marital Status: Married    Spouse Name: N/A    Number of Children: N/A  . Years of Education: N/A   Occupational History  . Not on file.   Social History Main Topics  . Smoking status: Never Smoker   . Smokeless tobacco: Never Used  . Alcohol Use: Yes     Comment:  1-2 glasses of wine daily  . Drug Use: No  . Sexual Activity: Yes    Partners: Male    Birth Control/ Protection: Post-menopausal   Other Topics Concern  . Not on file   Social History Narrative   Lives in [place].  Retired Licensed conveyancer.    Family History  Problem Relation Age of Onset  . Alzheimer's disease Mother   . Heart failure Father     Current Facility-Administered Medications  Medication Dose Route Frequency Provider Last Rate Last Dose  . bupivacaine 0.25 % ON-Q pump DUAL CATH 300 mL  300 mL Other Continuous Michael Boston, MD      . ceFAZolin (ANCEF) IVPB 2 g/50 mL premix  2 g Intravenous On Call to [place], MD      . gentamicin (GARAMYCIN) 350 mg in dextrose 5 % 100 mL IVPB  5 mg/kg (Order-Specific) Intravenous 30 min Pre-Op Julio Sicks, RPH      . lactated ringers infusion   Intravenous Continuous Peyton Najjar, MD 125 mL/hr at 11/24/13 0940 1,000 mL at 11/24/13 0940     Allergies  Allergen Reactions  . Statins     Leg weakness - but tolerating low dose Crestor every other day    ROS: Constitutional:  No fevers, chills, sweats.  Weight stable Eyes:  No vision changes, No discharge HENT:  No sore throats, nasal drainage Lymph: No neck swelling, No bruising easily Pulmonary:  No cough, productive sputum CV: No orthopnea, PND   No exertional chest/neck/shoulder/arm pain. GI:  No personal nor family history of GI/colon cancer, inflammatory bowel disease, irritable bowel syndrome, allergy such as Celiac Sprue, dietary/dairy problems, colitis, ulcers nor gastritis.  No recent sick contacts/gastroenteritis.  No travel outside the country.  No changes in diet. Renal: No UTIs, No hematuria Genital:  No drainage, bleeding, masses Musculoskeletal: No severe joint pain.  Good ROM major joints Skin:  No sores or lesions.  No rashes Heme/Lymph:  No easy bleeding.  No swollen lymph nodes Neuro: No focal weakness/numbness.  No seizures Psych: No suicidal  ideation.  No hallucinations  BP 129/69  Pulse 85  Temp(Src) 98.4 F (36.9 C) (Oral)  Resp 16  Ht 5\' 2"  (1.575 m)  Wt 154 lb (69.854 kg)  BMI 28.16 kg/m2  SpO2 99%  LMP 02/04/1996  Physical Exam: General: Pt awake/alert/oriented x4 in no major acute distress Eyes: PERRL, normal EOM. Sclera nonicteric Neuro: CN II-XII intact w/o focal sensory/motor deficits. Lymph: No head/neck/groin lymphadenopathy Psych:  No delerium/psychosis/paranoia HENT: Normocephalic, Mucus membranes moist.  No thrush Neck: Supple, No tracheal deviation Chest: No pain.  Good respiratory excursion. CV:  Pulses intact.  Regular rhythm Abdomen: Soft, Nondistended.  Nontender.  Epigastric VWH unchanged Ext:  SCDs  BLE.  No significant edema.  No cyanosis Skin: No petechiae / purpurea.  No major sores Musculoskeletal: No severe joint pain.  Good ROM major joints   Results:   Labs: No results found for this or any previous visit (from the past 48 hour(s)).  Imaging / Studies: No results found.  Medications / Allergies: per chart  Antibiotics: Anti-infectives   Start     Dose/Rate Route Frequency Ordered Stop   11/24/13 0756  ceFAZolin (ANCEF) IVPB 2 g/50 mL premix     2 g 100 mL/hr over 30 Minutes Intravenous On call to O.R. 11/24/13 0756 11/25/13 0559   11/24/13 0000  gentamicin (GARAMYCIN) 350 mg in dextrose 5 % 100 mL IVPB     5 mg/kg  69 kg (Order-Specific) 108.8 mL/hr over 60 Minutes Intravenous 30 min pre-op 11/23/13 1444        Assessment  [name] [name]  73 y.o. female  Day of Surgery  Procedure(s): LAPAROSCOPIC VENTRAL HERNIA INSERTION OF MESH  Problem List:  Principal Problem:   Ventral incisional hernia - epigastric   Epigastric VWH incisional hernia  Plan:  Lap repair:  The anatomy & physiology of the abdominal wall was discussed.  The pathophysiology of hernias was discussed.  Natural history risks without surgery including progeressive enlargement, pain,  incarceration, & strangulation was discussed.   Contributors to complications such as smoking, obesity, diabetes, prior surgery, etc were discussed.   I feel the risks of no intervention will lead to serious problems that outweigh the operative risks; therefore, I recommended surgery to reduce and repair the hernia.  I explained laparoscopic techniques with possible need for an open approach.  I noted the probable use of mesh to patch and/or buttress the hernia repair  Risks such as bleeding, infection, abscess, need for further treatment, stroke, heart attack, death, and other risks were discussed.  I noted a good likelihood this will help address the problem.   Goals of post-operative recovery were discussed as well.  Possibility that this will not correct all symptoms was explained.  I stressed the importance of low-impact activity, aggressive pain control, avoiding constipation, & not pushing through pain to minimize risk of post-operative chronic pain or injury. Possibility of reherniation especially with smoking, obesity, diabetes, immunosuppression, and other health conditions was discussed.  We will work to minimize complications.     An educational handout further explaining the pathology & treatment options was given as well.  Questions were answered.  The patient expresses understanding & wishes to proceed with surgery.    -VTE prophylaxis- SCDs, etc -mobilize as tolerated to help recovery    Adin Hector, M.D., F.A.C.S. Gastrointestinal and Minimally Invasive Surgery [facility] [address] [place], [place] [address] [phone] Main / Paging   11/24/2013  Note: Portions of this report may have been transcribed using voice recognition software. Every effort was made to ensure accuracy; however, inadvertent computerized transcription errors may be present.   Any transcriptional errors that result from this process are  unintentional.

## 2013-11-24 NOTE — Anesthesia Postprocedure Evaluation (Signed)
  Anesthesia Post-op Note  Patient: [name] [name]  Procedure(s) Performed: Procedure(s) (LRB): LAPAROSCOPIC VENTRAL HERNIA (N/A) INSERTION OF MESH (N/A)  Patient Location: PACU  Anesthesia Type: General  Level of Consciousness: awake and alert   Airway and Oxygen Therapy: Patient Spontanous Breathing  Post-op Pain: mild  Post-op Assessment: Post-op Vital signs reviewed, Patient's Cardiovascular Status Stable, Respiratory Function Stable, Patent Airway and No signs of Nausea or vomiting  Last Vitals:  Filed Vitals:   11/24/13 1600  BP: 129/73  Pulse: 67  Temp: 37.1 C  Resp: 16    Post-op Vital Signs: stable   Complications: No apparent anesthesia complications

## 2013-11-25 ENCOUNTER — Encounter (HOSPITAL_COMMUNITY): Payer: Self-pay | Admitting: Surgery

## 2013-11-25 DIAGNOSIS — K432 Incisional hernia without obstruction or gangrene: Secondary | ICD-10-CM | POA: Diagnosis not present

## 2013-11-25 MED ORDER — SODIUM CHLORIDE 0.9 % IJ SOLN: 3.0000 mL | INTRAMUSCULAR | Status: DC | PRN

## 2013-11-25 MED ORDER — SODIUM CHLORIDE 0.9 % IV SOLN: 250.0000 mL | INTRAVENOUS | Status: DC | PRN

## 2013-11-25 MED ORDER — ACETAMINOPHEN 500 MG PO TABS
1000.0000 mg | ORAL_TABLET | Freq: Three times a day (TID) | ORAL | Status: DC
Start: 2013-11-25 — End: 2013-11-25
  Administered 2013-11-25: 1000 mg via ORAL
  Filled 2013-11-25: qty 2

## 2013-11-25 MED ORDER — BISACODYL 10 MG RE SUPP: 10.0000 mg | Freq: Two times a day (BID) | RECTAL | Status: DC | PRN

## 2013-11-25 MED ORDER — LACTATED RINGERS IV BOLUS (SEPSIS): 1000.0000 mL | Freq: Three times a day (TID) | INTRAVENOUS | Status: DC | PRN

## 2013-11-25 MED ORDER — SODIUM CHLORIDE 0.9 % IJ SOLN: 3.0000 mL | Freq: Two times a day (BID) | INTRAMUSCULAR | Status: DC

## 2013-11-25 MED ORDER — FENTANYL CITRATE 0.05 MG/ML IJ SOLN: 25.0000 ug | INTRAMUSCULAR | Status: DC | PRN

## 2013-11-25 MED ORDER — OXYCODONE HCL 5 MG PO TABS: 5.0000 mg | ORAL_TABLET | ORAL | Status: DC | PRN

## 2013-11-25 MED ORDER — OXYCODONE HCL 5 MG PO TABS
10.0000 mg | ORAL_TABLET | ORAL | Status: DC | PRN
  Administered 2013-11-25 (×2): 10 mg via ORAL
  Filled 2013-11-25 (×3): qty 2

## 2013-11-25 MED ORDER — CARBIDOPA-LEVODOPA 25-100 MG PO TABS
1.0000 | ORAL_TABLET | Freq: Three times a day (TID) | ORAL | Status: DC
  Administered 2013-11-25: 1 via ORAL
  Filled 2013-11-25 (×3): qty 1

## 2013-11-25 NOTE — Progress Notes (Signed)
UR completed 

## 2013-11-25 NOTE — Discharge Instructions (Signed)
[facility] REPAIR: POST OP INSTRUCTIONS ° °1. DIET: Follow a light bland diet the first 24 hours after arrival home, such as soup, liquids, crackers, etc.  Be sure to include lots of fluids daily.  Avoid fast food or heavy meals as your are more likely to get nauseated.  Eat a low fat the next few days after surgery. °2. Take your usually prescribed home medications unless otherwise directed. °3. PAIN CONTROL: °a. Pain is best controlled by a usual combination of three different methods TOGETHER: °i. Ice/Heat °ii. Over the counter pain medication °iii. Prescription pain medication °b. Most patients will experience some swelling and bruising around the hernia(s) such as the bellybutton, groins, or old incisions.  Ice packs or heating pads (30-60 minutes up to 6 times a day) will help. Use ice for the first few days to help decrease swelling and bruising, then switch to heat to help relax tight/sore spots and speed recovery.  Some people prefer to use ice alone, heat alone, alternating between ice & heat.  Experiment to what works for you.  Swelling and bruising can take several weeks to resolve.   °c. It is helpful to take an over-the-counter pain medication regularly for the first few weeks.  Choose one of the following that works best for you: °i. Naproxen (Aleve, etc)  Two 220mg tabs twice a day °ii. Ibuprofen (Advil, etc) Three 200mg tabs four times a day (every meal & bedtime) °iii. Acetaminophen (Tylenol, etc) 325-650mg four times a day (every meal & bedtime) °d. A  prescription for pain medication should be given to you upon discharge.  Take your pain medication as prescribed.  °i. If you are having problems/concerns with the prescription medicine (does not control pain, nausea, vomiting, rash, itching, etc), please call us [phone] to see if we need to switch you to a different pain medicine that will work better for you and/or control your side effect better. °ii. If you need a refill on your pain  medication, please contact your pharmacy.  They will contact our office to request authorization. Prescriptions will not be filled after 5 pm or on week-ends. °4. Avoid getting constipated.  Between the surgery and the pain medications, it is common to experience some constipation.  Increasing fluid intake and taking a fiber supplement (such as Metamucil, Citrucel, FiberCon, MiraLax, etc) 1-2 times a day regularly will usually help prevent this problem from occurring.  A mild laxative (prune juice, Milk of Magnesia, MiraLax, etc) should be taken according to package directions if there are no bowel movements after 48 hours.   °5. Wash / shower every day.  You may shower over the dressings as they are waterproof.   °6. Remove your waterproof bandages 5 days after surgery.  You may leave the incision open to air.  You may replace a dressing/Band-Aid to cover the incision for comfort if you wish.  Continue to shower over incision(s) after the dressing is off. ° ° ° °7. ACTIVITIES as tolerated:   °a. You may resume regular (light) daily activities beginning the next day--such as daily self-care, walking, climbing stairs--gradually increasing activities as tolerated.  If you can walk 30 minutes without difficulty, it is safe to try more intense activity such as jogging, treadmill, bicycling, low-impact aerobics, swimming, etc. °b. Save the most intensive and strenuous activity for last such as sit-ups, heavy lifting, contact sports, etc  Refrain from any heavy lifting or straining until you are off narcotics for pain control.   °  c. DO NOT PUSH THROUGH PAIN.  Let pain be your guide: If it hurts to do something, don't do it.  Pain is your body warning you to avoid that activity for another week until the pain goes down. d. You may drive when you are no longer taking prescription pain medication, you can comfortably wear a seatbelt, and you can safely maneuver your car and apply brakes. e. Dennis Bast may have sexual intercourse  when it is comfortable.  8. FOLLOW UP in our office a. Please call [facility] at [phone] to set up an appointment to see your surgeon in the office for a follow-up appointment approximately 2-3 weeks after your surgery. b. Make sure that you call for this appointment the day you arrive home to insure a convenient appointment time. 9.  IF YOU HAVE DISABILITY OR FAMILY LEAVE FORMS, BRING THEM TO THE OFFICE FOR PROCESSING.  DO NOT GIVE THEM TO YOUR DOCTOR.  WHEN TO CALL us [phone]: 1. Poor pain control 2. Reactions / problems with new medications (rash/itching, nausea, etc)  3. Fever over 101.5 F (38.5 C) 4. Inability to urinate 5. Nausea and/or vomiting 6. Worsening swelling or bruising 7. Continued bleeding from incision. 8. Increased pain, redness, or drainage from the incision   The clinic staff is available to answer your questions during regular business hours (8:30am-5pm).  Please dont hesitate to call and ask to speak to one of our nurses for clinical concerns.   If you have a medical emergency, go to the nearest emergency room or call 911.  A surgeon from [facility] is always on call at the hospitals in [facility], [place], [place], [place], [place]  [id number] ?  [address] [address], [place], [place]   [address] MAIN: [phone] ? TOLL FREE: [phone] ? FAX: [phone] [email or web address]   Patient Education Sheet  ON-Q Pain Relief System   What is the ON-Q?  The ON-Q is a balloon-type pump filled with a medicine to treat your pain. It blocks the pain in the area of your procedure. With ON-Q, you may have better pain relief and need less pain medicine. Its small size allows you to move around freely.  How does the ON-Q work?  The pump is attached to a catheter (small tube) near your procedure site. The pump automatically pushes the medicine through the tube. DO NOT squeeze the pump. The pump may be  clipped to your clothing or dressing or placed in a small carrying case.   How do I know the pump is working?  The pump gives your medicine very slowly. It may take longer than 24 hours after your procedure to notice a change in the size and look of the pump. Your pump may look like the picture.  In time, the outside bag on the pump will get looser and wrinkles will form in the bag.  Talk to your doctor about taking other pain medication as needed.  Check the following:  o Make sure the white clamp on the tubing is open (moves freely on the tubing).  o Make sure there are no kinks in the pump tubing.  o Do not tape or cover the filter.   What should I do with my ON-Q when sleeping?  Make sure the pump is placed on a bedside table or on top of bed covers.  Do not place the pump underneath the bed covers where the pump may become too  warm.  Do not place the pump on the floor or hang the pump on a bedpost.   Can I take a shower with ON-Q?  Your doctor will tell you when its ok for you to shower.  Take care to protect the catheter site, pump and filter from water.  Do not submerge the pump in water.   How long will my ON-Q last?  Depending on the size of your pump, it may take 4-5 days to give you all the medicine. All your medicine has been delivered when the outside bag is flat and a hard tube can be felt in the middle of the pump.   Troubleshooting  Tubing Disconnection  If the pump tubing becomes disconnected from your catheter, Remove all dressings and catheters and throw the On-Q pump away  Leaking  If leaking from the pump or pump tubing occurs, then remove all dressings and catheters and throw the On-Q pump away   When should I call the surgeon?  Be aware that you may experience loss of feeling (numbness) at and around the area of your procedure. If this occurs, be careful not to hurt yourself. Be careful when placing hot or cold items on the numb area.  The following symptoms  may represent a serious medical condition.  Immediately close the clamp on the pump tubing and call your doctor or 911 in case of emergency.  Increase in pain  Fever, chills, sweats  Bowel or bladder changes  Difficulty breathing  Redness, warmth, or discharge or excessive bleeding from the catheter site  Dizziness or lightheadedness  Blurred vision  Ringing or buzzing in your ears  Metal taste in your mouth  Numbness or tingling around your mouth, fingers, or toes  Drowsiness  Confusion   How to remove the ON-Q catheter?  If your doctor has instructed you to remove the ON-Q catheter, follow their instructions keeping in mind these steps:  -Remove the dressing covering the catheter site.  -Remove any skin adhesive strips.  -Grasp the catheter close to the skin and gently pull on the catheter. -It should be easy to remove and not painful.  -If it becomes difficult STOP and call your doctor.  -Do not cut or pull hard to remove the catheter.  -Once removed, check the catheter tip for a black marking to ensure the entire catheter was removed. Call your doctor if you do not see the black marking.  Place bandage over the catheter site as instructed by your doctor.   Source: [employer], ON-Q, and [employer] are registered trademarks and [employer] and [employer] are trademarks of [employer].  2011 [employer]. All right reserved.  10/     Managing Pain  Pain after surgery or related to activity is often due to strain/injury to muscle, tendon, nerves and/or incisions.  This pain is usually short-term and will improve in a few months.   Many people find it helpful to do the following things TOGETHER to help speed the process of healing and to get back to regular activity more quickly:  1. Avoid heavy physical activity a. No lifting greater than 20 pounds to start. b. Do not push through the pain.  Listen to your body and avoid positions and maneuvers than  reproduce the pain c. Walking is okay as tolerated, but go slowly and stop when getting sore.  d. Remember: If it hurts to do it, then dont do it!  2. Take Anti-inflammatory medication  a. Take with food/snack  around the clock for 1-2 weeks i. This helps the muscle and nerve tissues become less irritable and calm down faster ii. Choose Acetaminophen 500mg  tabs (Tylenol) 1-2 pills with every meal and just before bedtime 3. Use a Heating pad or Ice/Cold Pack a. 4-6 times a day b. May use warm bath/hottub  or showers 4. Try Gentle Massage and/or Stretching  a. at the area of pain many times a day b. stop if you feel pain - do not overdo it  Try these steps together to help you body heal faster and avoid making things get worse.  Doing just one of these things may not be enough.    If you are not getting better after two weeks or are noticing you are getting worse, contact our office for further advice; we may need to re-evaluate you & see what other things we can do to help.

## 2013-11-25 NOTE — Discharge Summary (Signed)
Physician Discharge Summary  Patient ID: [name] [name] MRN: [id number] DOB/AGE: 1940-12-27 73 y.o.  Admit date: 11/24/2013 Discharge date: 11/25/2013  Patient Care Team: Jerlyn Ly, MD as PCP - General (Internal Medicine) Thayer Headings, MD as Consulting Physician (Cardiology) Carney Bern, MD as Referring Physician (Cardiothoracic Surgery) Geanie Cooley, MD as Consulting Physician (Internal Medicine)  Admission Diagnoses: Principal Problem:   Ventral incisional hernia - epigastric Active Problems:   Ventral hernia   Discharge Diagnoses:  Principal Problem:   Ventral incisional hernia - epigastric Active Problems:   Ventral hernia   POST-OPERATIVE DIAGNOSIS:  subxiphoid incisional abdominal wall hernia  SURGERY:  Procedure(s): LAPAROSCOPIC VENTRAL HERNIA INSERTION OF MESH  SURGEON:  Surgeon(s): Michael Boston, MD  Consults: None  Hospital Course:   The patient underwent the surgery above.  Postoperatively, the patient gradually mobilized and advanced to a solid diet.  Pain and other symptoms were treated aggressively.    By the time of discharge, the patient was walking well the hallways, eating food, having flatus.  Pain was well-controlled on an oral medications.  Based on meeting discharge criteria and continuing to recover, I felt it was safe for the patient to be discharged from the hospital to further recover with close followup. Postoperative recommendations were discussed in detail.  They are written as well.   Significant Diagnostic Studies:  Results for orders placed during the hospital encounter of 11/24/13 (from the past 72 hour(s))  CBC     Status: Abnormal   Collection Time    11/24/13  2:57 PM      Result Value Ref Range   WBC 12.0 (*) 4.0 - 10.5 K/uL   RBC 3.59 (*) 3.87 - 5.11 MIL/uL   Hemoglobin 10.6 (*) 12.0 - 15.0 g/dL   HCT 31.9 (*) 36.0 - 46.0 %   MCV 88.9  78.0 - 100.0 fL   MCH 29.5  26.0 - 34.0 pg   MCHC 33.2  30.0 - 36.0 g/dL    RDW 14.4  11.5 - 15.5 %   Platelets 185  150 - 400 K/uL  CREATININE, SERUM     Status: Abnormal   Collection Time    11/24/13  2:57 PM      Result Value Ref Range   Creatinine, Ser 0.64  0.50 - 1.10 mg/dL   GFR calc non Af Amer 88 (*) >90 mL/min   GFR calc Af Amer >90  >90 mL/min   Comment: (NOTE)     The eGFR has been calculated using the CKD EPI equation.     This calculation has not been validated in all clinical situations.     eGFR's persistently <90 mL/min signify possible Chronic Kidney     Disease.    No results found.  Discharge Exam: Blood pressure 121/52, pulse 61, temperature 98.4 F (36.9 C), temperature source Oral, resp. rate 16, height 5' 2"  (1.575 m), weight 154 lb (69.854 kg), last menstrual period 02/04/1996, SpO2 93.00%.  General: Pt awake/alert/oriented x4 in no major acute distress Eyes: PERRL, normal EOM. Sclera nonicteric Neuro: CN II-XII intact w/o focal sensory/motor deficits. Lymph: No head/neck/groin lymphadenopathy Psych:  No delerium/psychosis/paranoia HENT: Normocephalic, Mucus membranes moist.  No thrush Neck: Supple, No tracheal deviation Chest: No pain.  Good respiratory excursion. CV:  Pulses intact.  Regular rhythm MS: Normal AROM mjr joints.  No obvious deformity Abdomen: Soft, Nondistended.  Min tender at incsions.  No incarcerated hernias. Ext:  SCDs BLE.  No significant edema.  No  cyanosis Skin: No petechiae / purpura  Discharged Condition: good   Past Medical History  Diagnosis Date  . Hypertension   . Hyperlipidemia     a. Hx of leg weakness while on statin. under control  . Leg fracture Dec 21,2011  . Chronic anticoagulation     on Xarelto  . GERD (gastroesophageal reflux disease)   . Parkinson's disease 2011  . Osteoporosis 07/2007  . PAF (paroxysmal atrial fibrillation)     s/p atrial fib ablation 11-11-11.   . Wrist fracture     right  . Chronic diastolic CHF (congestive heart failure)     Echocardiogram (11/24/12):  Mild LVH, EF 60%.  . Heart murmur     slight  . Pacemaker   . Hypothyroidism   . Shortness of breath     "when walking at incline or stairs"  . Arthritis     thumbs  . Squamous carcinoma summer 2015    back of left thigh  . Cataract     bilateral    Past Surgical History  Procedure Laterality Date  . Knee surgery Left 2001  . US echocardiography  03-10-2008    Est EF 55-60%, Dr. Cathie Olden  . Cardiovascular stress test  06-06-2008    EF 73%  . Wrist surgery Right ~2009    metal rod in place  . Cardioversion  09/09/2011    Procedure: CARDIOVERSION;  Surgeon: Darlin Coco, MD;  Location: [facility];  Service: Cardiovascular;  Laterality: N/A;  to be done by dr. Mare Ferrari  . Tee without cardioversion  11/10/2011    Procedure: TRANSESOPHAGEAL ECHOCARDIOGRAM (TEE);  Surgeon: Thayer Headings, MD;  Location: [facility];  Service: Cardiovascular;  Laterality: N/A;  . Cardioversion  02/18/2012    Procedure: CARDIOVERSION;  Surgeon: Thayer Headings, MD;  Location: [facility];  Service: Cardiovascular;  Laterality: N/A;  . Cholecystectomy  1995  . Femur fracture surgery  2011    metal pin in place  . 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 Cardiac electrophysiology study and ablation  5/14, 7/14  . Skin cancer destruction  summer 2015  . Breast surgery      benign cyst removed  . Forearm surgery Left 2010    "opened arm to drain it"  . Tonsillectomy  as child    History   Social History  . Marital Status: Married    Spouse Name: N/A    Number of Children: N/A  . Years of Education: N/A   Occupational History  . Not on file.   Social History Main Topics  . Smoking status: Never Smoker   . Smokeless tobacco: Never Used  . Alcohol Use: Yes     Comment: 1-2 glasses of wine daily  . Drug Use: No  . Sexual Activity: Yes    Partners: Male    Birth Control/ Protection: Post-menopausal   Other Topics Concern  . Not on file   Social History Narrative   Lives in  [place].  Retired Licensed conveyancer.    Family History  Problem Relation Age of Onset  . Alzheimer's disease Mother   . Heart failure Father     Current Facility-Administered Medications  Medication Dose Route Frequency Provider Last Rate Last Dose  . 0.9 %  sodium chloride infusion  250 mL Intravenous PRN Michael Boston, MD      . 0.9 %  sodium chloride infusion  250 mL Intravenous PRN Michael Boston, MD      .  acetaminophen (TYLENOL) tablet 1,000 mg  1,000 mg Oral TID PC & HS Michael Boston, MD      . alum & mag hydroxide-simeth (MAALOX/MYLANTA) 200-200-20 MG/5ML suspension 30 mL  30 mL Oral Q6H PRN Michael Boston, MD      . bisacodyl (DULCOLAX) suppository 10 mg  10 mg Rectal Q12H PRN Michael Boston, MD      . bupivacaine 0.25 % ON-Q pump DUAL CATH 300 mL  300 mL Other Continuous Michael Boston, MD      . clonazePAM Bobbye Charleston) tablet 0.5 mg  0.5 mg Oral QHS Michael Boston, MD   0.5 mg at 11/24/13 2133  . diphenhydrAMINE (BENADRYL) injection 12.5-25 mg  12.5-25 mg Intravenous Q6H PRN Michael Boston, MD      . docusate sodium (COLACE) capsule 200 mg  200 mg Oral Daily Michael Boston, MD   200 mg at 11/24/13 2132  . dofetilide (TIKOSYN) capsule 250 mcg  250 mcg Oral BID Michael Boston, MD   250 mcg at 11/24/13 2132  . ezetimibe (ZETIA) tablet 10 mg  10 mg Oral Carlis Abbott, MD   10 mg at 11/24/13 2132  . fentaNYL (SUBLIMAZE) injection 25-50 mcg  25-50 mcg Intravenous Q1H PRN Michael Boston, MD      . furosemide (LASIX) tablet 20 mg  20 mg Oral Daily Michael Boston, MD   20 mg at 11/24/13 1625  . heparin injection 5,000 Units  5,000 Units Subcutaneous 3 times per day Michael Boston, MD   5,000 Units at 11/25/13 0516  . lactated ringers bolus 1,000 mL  1,000 mL Intravenous Q8H PRN Michael Boston, MD      . lactated ringers bolus 1,000 mL  1,000 mL Intravenous Q8H PRN Michael Boston, MD      . levothyroxine (SYNTHROID, LEVOTHROID) tablet 50 mcg  50 mcg Oral QAC breakfast Michael Boston, MD      . lip balm (CARMEX)  ointment 1 application  1 application Topical BID Michael Boston, MD   1 application at 22/29/79 2136  . magic mouthwash  15 mL Oral QID PRN Michael Boston, MD      . menthol-cetylpyridinium (CEPACOL) lozenge 3 mg  1 lozenge Oral PRN Michael Boston, MD      . metoprolol (LOPRESSOR) injection 5 mg  5 mg Intravenous Q6H PRN Michael Boston, MD      . metoprolol tartrate (LOPRESSOR) tablet 12.5 mg  12.5 mg Oral Q12H PRN Michael Boston, MD      . metoprolol tartrate (LOPRESSOR) tablet 25 mg  25 mg Oral BID Michael Boston, MD   25 mg at 11/24/13 2132  . ondansetron (ZOFRAN) tablet 4 mg  4 mg Oral Q6H PRN Michael Boston, MD       Or  . ondansetron [facility]) injection 4 mg  4 mg Intravenous Q6H PRN Michael Boston, MD      . oxyCODONE (Oxy IR/ROXICODONE) immediate release tablet 10-15 mg  10-15 mg Oral Q4H PRN Michael Boston, MD      . phenol [facility]) mouth spray 2 spray  2 spray Mouth/Throat PRN Michael Boston, MD      . polycarbophil (FIBERCON) tablet 1,250 mg  1,250 mg Oral Daily Michael Boston, MD   1,250 mg at 11/24/13 1625  . polyethylene glycol (MIRALAX / GLYCOLAX) packet 17 g  17 g Oral Q12H PRN Michael Boston, MD      . potassium chloride (K-DUR,KLOR-CON) CR tablet 10 mEq  10 mEq Oral Daily Michael Boston, MD   10 mEq  at 11/24/13 1625  . promethazine (PHENERGAN) injection 6.25-12.5 mg  6.25-12.5 mg Intravenous Q4H PRN Michael Boston, MD      . rasagiline (AZILECT) tablet 1 mg  1 mg Oral Daily Michael Boston, MD      . rivaroxaban [facility]) tablet 20 mg  20 mg Oral QHS Michael Boston, MD      . rosuvastatin (CRESTOR) tablet 2.5 mg  2.5 mg Oral Carlis Abbott, MD   2.5 mg at 11/24/13 2132  . sodium chloride (OCEAN) 0.65 % nasal spray 2 spray  2 spray Each Nare Daily PRN Michael Boston, MD      . sodium chloride 0.9 % injection 3 mL  3 mL Intravenous Q12H Michael Boston, MD   3 mL at 11/24/13 1620  . sodium chloride 0.9 % injection 3 mL  3 mL Intravenous PRN Michael Boston, MD      . sodium chloride 0.9 % injection 3 mL  3 mL  Intravenous Q12H Michael Boston, MD      . sodium chloride 0.9 % injection 3 mL  3 mL Intravenous PRN Michael Boston, MD      . valACYclovir (VALTREX) tablet 500 mg  500 mg Oral Daily PRN Michael Boston, MD         Allergies  Allergen Reactions  . Statins     Leg weakness - but tolerating low dose Crestor every other day    Disposition: 01-Home or Self Care  Discharge Instructions   Call MD for:  extreme fatigue    Complete by:  As directed      Call MD for:  hives    Complete by:  As directed      Call MD for:  persistant nausea and vomiting    Complete by:  As directed      Call MD for:  redness, tenderness, or signs of infection (pain, swelling, redness, odor or green/yellow discharge around incision site)    Complete by:  As directed      Call MD for:  severe uncontrolled pain    Complete by:  As directed      Call MD for:    Complete by:  As directed   Temperature > 101.41F     Diet - low sodium heart healthy    Complete by:  As directed      Discharge instructions    Complete by:  As directed   Please see discharge instruction sheets.  Also refer to handout given an office.  Please call our office if you have any questions or concerns [phone]     Discharge wound care:    Complete by:  As directed   If you have closed incisions, shower and bathe over these incisions with soap and water every day.  Remove all surgical dressings on postoperative day #3.  You do not need to replace dressings over the closed incisions unless you feel more comfortable with a Band-Aid covering it.   If you have an open wound that requires packing, please see wound care instructions.  In general, remove all dressings, wash wound with soap and water and then replace with saline moistened gauze.  Do the dressing change at least every day.  Please call our office [phone] if you have further questions.     Driving Restrictions    Complete by:  As directed   No driving until off narcotics and can  safely swerve away without pain during an emergency  Increase activity slowly    Complete by:  As directed   Walk an hour a day.  Use 20-30 minute walks.  When you can walk 30 minutes without difficulty, increase to low impact/moderate activities such as biking, jogging, swimming, sexual activity..  Eventually can increase to unrestricted activity when not feeling pain.  If you feel pain: STOP![name]   Let pain protect you from overdoing it.  Use ice/heat/over-the-counter pain medications to help minimize his soreness.  Use pain prescriptions as needed to remain active.  It is better to take extra pain medications and be more active than to stay bedridden to avoid all pain medications.     Lifting restrictions    Complete by:  As directed   Avoid heavy lifting initially.  Do not push through pain.  You have no specific weight limit.  Coughing and sneezing or four more stressful to your incision than any lifting you will do. Pain will protect you from injury.  Therefore, avoid intense activity until off all narcotic pain medications.  Coughing and sneezing or four more stressful to your incision than any lifting he will do.     May shower / Bathe    Complete by:  As directed      May walk up steps    Complete by:  As directed      Sexual Activity Restrictions    Complete by:  As directed   Sexual activity as tolerated.  Do not push through pain.  Pain will protect you from injury.     Walk with assistance    Complete by:  As directed   Walk over an hour a day.  May use a walker/cane/companion to help with balance and stamina.            Medication List         acetaminophen 500 MG tablet  Commonly known as:  TYLENOL  Take 1,000 mg by mouth every 4 (four) hours as needed for mild pain or headache.     AZILECT 1 MG Tabs tablet  Generic drug:  rasagiline  Take 1 mg by mouth every morning.     B COMPLEX PO  Take 1 tablet by mouth every morning.     BD PEN NEEDLE NANO U/F 32G X 4 MM Misc   Generic drug:  Insulin Pen Needle  1 each daily.     CALCIUM PO  Take 1 tablet by mouth every morning.     carbidopa-levodopa 25-100 MG per tablet  Commonly known as:  SINEMET  Take 1 tablet by mouth 3 (three) times daily.     clonazePAM 0.5 MG tablet  Commonly known as:  KLONOPIN  Take 1 tablet (0.5 mg total) by mouth at bedtime.     CO Q 10 PO  Take 1 tablet by mouth every morning.     COLACE PO  Take 1 capsule by mouth 2 (two) times daily.     dofetilide 250 MCG capsule  Commonly known as:  TIKOSYN  Take 250 mcg by mouth 2 (two) times daily.     FIBERCON PO  Take 2 tablets by mouth every morning.     FISH OIL PO  Take 2 capsules by mouth at bedtime.     FORTEO 600 MCG/2.4ML Soln  Generic drug:  Teriparatide (Recombinant)  Inject 20 mcg into the skin every morning.     furosemide 20 MG tablet  Commonly known as:  LASIX  Take 20 mg by mouth  every morning.     levothyroxine 50 MCG tablet  Commonly known as:  SYNTHROID, LEVOTHROID  Take 50 mcg by mouth daily before breakfast.     losartan 25 MG tablet  Commonly known as:  COZAAR  Take 25 mg by mouth every morning.     LUTEIN PO  Take 1 tablet by mouth every morning.     MAGNESIUM PO  Take 1 tablet by mouth at bedtime.     metoprolol tartrate 25 MG tablet  Commonly known as:  LOPRESSOR  Take 25 mg by mouth 2 (two) times daily.     MULTIVITAMIN PO  Take 1 tablet by mouth every morning.     NEXIUM 40 MG capsule  Generic drug:  esomeprazole  Take 1 capsule by mouth every morning.     oxyCODONE 5 MG immediate release tablet  Commonly known as:  Oxy IR/ROXICODONE  Take 1-3 tablets (5-15 mg total) by mouth every 4 (four) hours as needed for moderate pain, severe pain or breakthrough pain.     potassium chloride 10 MEQ tablet  Commonly known as:  K-DUR,KLOR-CON  Take 10 mEq by mouth every morning.     rivaroxaban 20 MG Tabs tablet  Commonly known as:  XARELTO  Take 20 mg by mouth at bedtime.      rosuvastatin 5 MG tablet  Commonly known as:  CRESTOR  Take 2.5 mg by mouth every other day.     sodium chloride 0.65 % Soln nasal spray  Commonly known as:  OCEAN  Place 2 sprays into both nostrils daily as needed for congestion.     valACYclovir 500 MG tablet  Commonly known as:  VALTREX  Take 500 mg by mouth daily as needed.     Vitamin D (Ergocalciferol) 50000 UNITS Caps capsule  Commonly known as:  DRISDOL  Take 50,000 Units by mouth every 7 (seven) days.     ZETIA 10 MG tablet  Generic drug:  ezetimibe  Take 10 mg by mouth every other day. Taking the same day as the crestor           Follow-up Information   Follow up with Eryca Bolte C., MD In 3 weeks. (To follow up after your operation, To follow up after your hospital stay)    Specialty:  General Surgery   Contact information:   [address] [place] [address] [phone]        Signed: Morton Peters, M.D., F.A.C.S. Gastrointestinal and Minimally Invasive Surgery [facility] [address], [place], [place] [address] [phone] Main / Paging   11/25/2013, 7:32 AM

## 2013-11-25 NOTE — Progress Notes (Signed)
Pt had one emesis after lunch, stated it was from taking so many pills.  Zofran given per MAR.  Pt able to tolerate crackers with another round of pain meds, wishes to discharge.  Pt instructed in care of incisions, dc of On-Q, and when to call doctor.  All questions answered.  Pt taken out by NT in wheelchair in stable condition

## 2013-12-05 ENCOUNTER — Encounter (HOSPITAL_COMMUNITY): Payer: Self-pay | Admitting: Surgery

## 2013-12-05 ENCOUNTER — Other Ambulatory Visit: Payer: Self-pay | Admitting: Cardiovascular Disease

## 2013-12-06 DIAGNOSIS — H25812 Combined forms of age-related cataract, left eye: Secondary | ICD-10-CM | POA: Diagnosis not present

## 2013-12-06 DIAGNOSIS — H52202 Unspecified astigmatism, left eye: Secondary | ICD-10-CM | POA: Diagnosis not present

## 2013-12-06 DIAGNOSIS — H2512 Age-related nuclear cataract, left eye: Secondary | ICD-10-CM | POA: Diagnosis not present

## 2013-12-09 DIAGNOSIS — R5381 Other malaise: Secondary | ICD-10-CM | POA: Diagnosis not present

## 2013-12-09 DIAGNOSIS — M7989 Other specified soft tissue disorders: Secondary | ICD-10-CM | POA: Diagnosis not present

## 2013-12-09 DIAGNOSIS — R609 Edema, unspecified: Secondary | ICD-10-CM | POA: Diagnosis not present

## 2013-12-09 DIAGNOSIS — R140 Abdominal distension (gaseous): Secondary | ICD-10-CM | POA: Diagnosis not present

## 2013-12-09 DIAGNOSIS — R0600 Dyspnea, unspecified: Secondary | ICD-10-CM | POA: Diagnosis not present

## 2013-12-09 DIAGNOSIS — I10 Essential (primary) hypertension: Secondary | ICD-10-CM | POA: Diagnosis not present

## 2013-12-09 DIAGNOSIS — D62 Acute posthemorrhagic anemia: Secondary | ICD-10-CM | POA: Diagnosis not present

## 2013-12-09 DIAGNOSIS — E8770 Fluid overload, unspecified: Secondary | ICD-10-CM | POA: Diagnosis not present

## 2013-12-09 DIAGNOSIS — R0602 Shortness of breath: Secondary | ICD-10-CM | POA: Diagnosis not present

## 2013-12-09 DIAGNOSIS — R0609 Other forms of dyspnea: Secondary | ICD-10-CM | POA: Diagnosis not present

## 2013-12-09 DIAGNOSIS — R188 Other ascites: Secondary | ICD-10-CM | POA: Diagnosis not present

## 2013-12-09 DIAGNOSIS — Z9049 Acquired absence of other specified parts of digestive tract: Secondary | ICD-10-CM | POA: Diagnosis not present

## 2013-12-09 DIAGNOSIS — I481 Persistent atrial fibrillation: Secondary | ICD-10-CM | POA: Diagnosis not present

## 2013-12-09 DIAGNOSIS — I5033 Acute on chronic diastolic (congestive) heart failure: Secondary | ICD-10-CM | POA: Diagnosis not present

## 2013-12-09 DIAGNOSIS — M810 Age-related osteoporosis without current pathological fracture: Secondary | ICD-10-CM | POA: Diagnosis not present

## 2013-12-09 DIAGNOSIS — K91841 Postprocedural hemorrhage and hematoma of a digestive system organ or structure following other procedure: Secondary | ICD-10-CM | POA: Diagnosis not present

## 2013-12-09 DIAGNOSIS — J9811 Atelectasis: Secondary | ICD-10-CM | POA: Diagnosis not present

## 2013-12-09 DIAGNOSIS — E871 Hypo-osmolality and hyponatremia: Secondary | ICD-10-CM | POA: Diagnosis not present

## 2013-12-09 DIAGNOSIS — J90 Pleural effusion, not elsewhere classified: Secondary | ICD-10-CM | POA: Diagnosis not present

## 2013-12-09 DIAGNOSIS — Z95810 Presence of automatic (implantable) cardiac defibrillator: Secondary | ICD-10-CM | POA: Diagnosis not present

## 2013-12-09 DIAGNOSIS — I459 Conduction disorder, unspecified: Secondary | ICD-10-CM | POA: Diagnosis not present

## 2013-12-09 DIAGNOSIS — D259 Leiomyoma of uterus, unspecified: Secondary | ICD-10-CM | POA: Diagnosis not present

## 2013-12-09 DIAGNOSIS — K219 Gastro-esophageal reflux disease without esophagitis: Secondary | ICD-10-CM | POA: Diagnosis not present

## 2013-12-09 DIAGNOSIS — R17 Unspecified jaundice: Secondary | ICD-10-CM | POA: Diagnosis not present

## 2013-12-09 DIAGNOSIS — I495 Sick sinus syndrome: Secondary | ICD-10-CM | POA: Diagnosis not present

## 2013-12-09 DIAGNOSIS — I4891 Unspecified atrial fibrillation: Secondary | ICD-10-CM | POA: Diagnosis not present

## 2013-12-09 DIAGNOSIS — G20 Parkinson's disease: Secondary | ICD-10-CM | POA: Diagnosis not present

## 2013-12-09 DIAGNOSIS — Z950 Presence of cardiac pacemaker: Secondary | ICD-10-CM | POA: Diagnosis not present

## 2013-12-09 DIAGNOSIS — Z79899 Other long term (current) drug therapy: Secondary | ICD-10-CM | POA: Diagnosis not present

## 2013-12-09 DIAGNOSIS — F419 Anxiety disorder, unspecified: Secondary | ICD-10-CM | POA: Diagnosis not present

## 2013-12-09 DIAGNOSIS — E039 Hypothyroidism, unspecified: Secondary | ICD-10-CM | POA: Diagnosis not present

## 2013-12-10 DIAGNOSIS — D6489 Other specified anemias: Secondary | ICD-10-CM | POA: Diagnosis not present

## 2013-12-10 DIAGNOSIS — Z9889 Other specified postprocedural states: Secondary | ICD-10-CM | POA: Diagnosis not present

## 2013-12-10 DIAGNOSIS — S3690XA Unspecified injury of unspecified intra-abdominal organ, initial encounter: Secondary | ICD-10-CM | POA: Diagnosis not present

## 2013-12-10 DIAGNOSIS — M96830 Postprocedural hemorrhage and hematoma of a musculoskeletal structure following a musculoskeletal system procedure: Secondary | ICD-10-CM | POA: Diagnosis not present

## 2013-12-10 DIAGNOSIS — I4891 Unspecified atrial fibrillation: Secondary | ICD-10-CM | POA: Diagnosis not present

## 2013-12-15 ENCOUNTER — Other Ambulatory Visit: Payer: Self-pay | Admitting: Cardiovascular Disease

## 2013-12-16 DIAGNOSIS — Z6830 Body mass index (BMI) 30.0-30.9, adult: Secondary | ICD-10-CM | POA: Diagnosis not present

## 2013-12-16 DIAGNOSIS — I480 Paroxysmal atrial fibrillation: Secondary | ICD-10-CM | POA: Diagnosis not present

## 2013-12-16 DIAGNOSIS — E039 Hypothyroidism, unspecified: Secondary | ICD-10-CM | POA: Diagnosis not present

## 2013-12-16 DIAGNOSIS — D649 Anemia, unspecified: Secondary | ICD-10-CM | POA: Diagnosis not present

## 2013-12-16 DIAGNOSIS — T148 Other injury of unspecified body region: Secondary | ICD-10-CM | POA: Diagnosis not present

## 2013-12-16 DIAGNOSIS — I509 Heart failure, unspecified: Secondary | ICD-10-CM | POA: Diagnosis not present

## 2013-12-19 ENCOUNTER — Encounter: Payer: Self-pay | Admitting: Internal Medicine

## 2013-12-20 ENCOUNTER — Other Ambulatory Visit: Payer: Self-pay | Admitting: Neurology

## 2013-12-20 DIAGNOSIS — H2511 Age-related nuclear cataract, right eye: Secondary | ICD-10-CM | POA: Diagnosis not present

## 2013-12-20 DIAGNOSIS — H52201 Unspecified astigmatism, right eye: Secondary | ICD-10-CM | POA: Diagnosis not present

## 2013-12-20 DIAGNOSIS — H25811 Combined forms of age-related cataract, right eye: Secondary | ICD-10-CM | POA: Diagnosis not present

## 2013-12-21 NOTE — Telephone Encounter (Signed)
Clonazepam refill requested. Per last office note- patient to remain on medication. Refill approved and sent to patient's pharmacy.   

## 2013-12-22 DIAGNOSIS — D649 Anemia, unspecified: Secondary | ICD-10-CM | POA: Diagnosis not present

## 2014-01-02 ENCOUNTER — Other Ambulatory Visit: Payer: Self-pay | Admitting: Cardiovascular Disease

## 2014-01-02 DIAGNOSIS — D649 Anemia, unspecified: Secondary | ICD-10-CM | POA: Diagnosis not present

## 2014-01-03 ENCOUNTER — Other Ambulatory Visit: Payer: Self-pay | Admitting: Cardiovascular Disease

## 2014-01-11 DIAGNOSIS — H903 Sensorineural hearing loss, bilateral: Secondary | ICD-10-CM | POA: Diagnosis not present

## 2014-01-12 ENCOUNTER — Encounter (HOSPITAL_COMMUNITY): Payer: Self-pay | Admitting: Internal Medicine

## 2014-01-21 ENCOUNTER — Other Ambulatory Visit: Payer: Self-pay | Admitting: Cardiovascular Disease

## 2014-01-23 DIAGNOSIS — D649 Anemia, unspecified: Secondary | ICD-10-CM | POA: Diagnosis not present

## 2014-01-30 ENCOUNTER — Other Ambulatory Visit: Payer: Self-pay | Admitting: Cardiovascular Disease

## 2014-02-01 ENCOUNTER — Other Ambulatory Visit: Payer: Self-pay | Admitting: Neurology

## 2014-02-01 NOTE — Telephone Encounter (Signed)
Levodopa refill requested. Per last office note- patient to remain on medication. Refill approved and sent to patient's pharmacy.

## 2014-02-06 ENCOUNTER — Other Ambulatory Visit: Payer: Self-pay | Admitting: Cardiovascular Disease

## 2014-02-10 ENCOUNTER — Other Ambulatory Visit: Payer: Self-pay

## 2014-02-10 DIAGNOSIS — Z45018 Encounter for adjustment and management of other part of cardiac pacemaker: Secondary | ICD-10-CM | POA: Diagnosis not present

## 2014-02-10 DIAGNOSIS — Z1231 Encounter for screening mammogram for malignant neoplasm of breast: Secondary | ICD-10-CM

## 2014-02-10 DIAGNOSIS — I4891 Unspecified atrial fibrillation: Secondary | ICD-10-CM | POA: Diagnosis not present

## 2014-02-13 ENCOUNTER — Encounter: Payer: Self-pay | Admitting: Neurology

## 2014-02-13 ENCOUNTER — Ambulatory Visit (INDEPENDENT_AMBULATORY_CARE_PROVIDER_SITE_OTHER): Payer: Medicare Other | Admitting: Neurology

## 2014-02-13 VITALS — BP 100/60 | HR 84 | Ht 62.0 in | Wt 150.0 lb

## 2014-02-13 DIAGNOSIS — G4700 Insomnia, unspecified: Secondary | ICD-10-CM

## 2014-02-13 DIAGNOSIS — G20 Parkinson's disease: Secondary | ICD-10-CM

## 2014-02-13 NOTE — Progress Notes (Signed)
[name] [name] was seen today in the movement disorders clinic for neurologic consultation at the request of [name],[name], MD.  The consultation is for the evaluation of PD.  She has previously seen [name] and [name].  I have some of [name] notes.  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  She had a normal MRI brain at that time according to notes from Dr. love.  In 2011, her diagnosis was changed to Parkinson's disease and Azilect was added.  She had a second opinion at [facility] on 07/25/2009 and [name] also thought that she had Parkinson's disease.  In January, 2014 [name] added Artane, presumably for tremor.  She does think that it has helped the tremor.  02/22/13 update:  The pt is f/u today re: PD.  After our discussion last visit, she decided to stop the artane and states that while she noted a little increase in tremor, it hasn't been bad.  I had wanted to start her on carbidopa/levodopa 25/100 but she wanted to hold on that last vist.  Balance is good but she has felt more slow and stiff.  No falls.  No hallucinations.  No n/v.  No near syncope.  Not exercising faithfully.  06/20/13 update:  The pt is f/u today re: PD.  She was started on carbidopa/levodopa 25/100 last visit in January in addition to the azilect that she was already on.  She just returned from a trip from the [place] and was so surprised that she could do all of the planned excursions.  She felt great that she could do all the hiking and could get in and out of the zodiac boat.  She states that she is so glad she started taking the levodopa and thinks that her mood is better.  Her coordination and tremor are better but she still has some tremor.  Some minor stomach upset.  Pharmacist told her to take it with food and she admits that she is taking it with protein.  She last took levodopa at 7:30 am and was examined at 10am.  She is having difficulty staying asleep.  10/13/13 update:  Pt is on carbidopa/levodopa 25/100  and is supposed to be on one tid but admits that she is only taking 1/2 po bid. Did this because of GI upset.   She is having more tremor especially when walking in the L hand; she is not sure if it is any particular time of day.  It doesn't bother her too much.  Some increased difficulty buttoning clothing.  Has a URI and is leaving for [place] on Friday.  She is going for 3 weeks.    02/13/14 update:  The patient returns today for follow-up.  She reports that she is not moving as fluidly as she previously was.  Last time I told her that she needed to really increase her medication to one tablet 3 times per day but she did not do that and she is only taking one twice a day; interestingly she gets the first and the middle of the day dosage in and forgets the last.  She does get nighttime (leg/toe) cramping.  She did have a ventral hernia repair on 11/24/2013.  States that she had a difficult time recovering as she had a hematoma after.  Could not walk after surgery and has not been back to exercise but knows that she needs to.  No falls.  Little tremor.    She was hospitalized  since last visit with CHF. I reviewed her MRI of the brain from April, 2009.  It was unremarkable.  Formal report is below:  Clinical Data: Syncopal episode. Confusion.  MRI HEAD WITHOUT CONTRAST  MRA HEAD WITHOUT CONTRAST  Technique: Multiplanar, multiecho pulse sequences of the brain and  surrounding structures were obtained according to standard protocol  with intravenous contrast. Angiographic images of the head were  obtained using MRA technique without contrast.  Comparison: No comparison MR. Prior CT 05/20/2007.  MRI HEAD  Findings: No acute infarct. No intracranial hemorrhage. No  hydrocephalous. No intracranial mass lesion detected on this  unenhanced exam. Major intracranial vascular structures are  patent. The ectatic vertebral arteries cause slight indentation  upon the medulla. Very minimal partial  opacification mastoid air  cells.  IMPRESSION:  No acute infarct.  MRA HEAD  Findings: Ectasia of the distal vertical segment of the carotid  arteries bilaterally. Mild ectasia of the cavernous segment of the  internal carotid arteries bilaterally. Minimal narrowing proximal  A1 segment of the anterior cerebral arteries bilaterally.  Ectatic vertebral arteries and basilar artery. The AICAs are not  seen on either side. Remainder of major intracranial vascular  structures are patent. Minimal branch vessel irregularity. No  aneurysm visualized.  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  PREVIOUS MEDICATIONS: Azilect and artane  ALLERGIES:   Allergies  Allergen Reactions  . Statins     Leg weakness - but tolerating low dose Crestor every other day    CURRENT MEDICATIONS:  Current Outpatient Prescriptions on File Prior to Visit  Medication Sig Dispense Refill  . acetaminophen (TYLENOL) 500 MG tablet Take 1,000 mg by mouth every 4 (four) hours as needed for mild pain or headache.    . BD PEN NEEDLE NANO U/F 32G X 4 MM MISC 1 each daily.     [name] CALCIUM PO Take 1 tablet by mouth every morning.     . Calcium Polycarbophil (FIBERCON PO) Take 2 tablets by mouth every morning.     . carbidopa-levodopa (SINEMET IR) 25-100 MG per tablet TAKE 1 TABLET BY MOUTH 3 (THREE) TIMES DAILY. (Patient taking differently: BID) 270 tablet 3  . clonazePAM (KLONOPIN) 0.5 MG tablet TAKE 1 TABLET BY MOUTH AT BEDTIME 30 tablet 5  . Coenzyme Q10 (CO Q 10 PO) Take 1 tablet by mouth every morning.     Mariane Baumgarten Sodium (COLACE PO) Take 1 capsule by mouth 2 (two) times daily.     [name] dofetilide (TIKOSYN) 250 MCG capsule Take 250 mcg by mouth 2 (two) times daily.     [name] ezetimibe (ZETIA) 10 MG tablet Take 10 mg by mouth every other day. Taking the same day as the crestor    . FORTEO 600 MCG/2.4ML SOLN Inject 20 mcg into the skin every morning.     . furosemide  (LASIX) 20 MG tablet TAKE 1 TABLET (20 MG TOTAL) BY MOUTH DAILY. 15 tablet 0  . levothyroxine (SYNTHROID, LEVOTHROID) 50 MCG tablet Take 50 mcg by mouth daily before breakfast.    . losartan (COZAAR) 25 MG tablet TAKE 1 TABLET EVERY DAY 90 tablet 1  . LUTEIN PO Take 1 tablet by mouth every morning.     [name] MAGNESIUM PO Take 1 tablet by mouth at bedtime.     [name] NEXIUM 40 MG capsule Take 1 capsule by mouth every morning.     . potassium chloride (K-DUR,KLOR-CON) 10 MEQ tablet Take 10 mEq  by mouth every morning.    . rasagiline (AZILECT) 1 MG TABS tablet Take 1 mg by mouth every morning.    . rosuvastatin (CRESTOR) 5 MG tablet Take 2.5 mg by mouth every other day.    . Vitamin D, Ergocalciferol, (DRISDOL) 50000 UNITS CAPS Take 50,000 Units by mouth every 7 (seven) days.     . valACYclovir (VALTREX) 500 MG tablet Take 500 mg by mouth daily as needed.     No current facility-administered medications on file prior to visit.    PAST MEDICAL HISTORY:   Past Medical History  Diagnosis Date  . Hypertension   . Hyperlipidemia     a. Hx of leg weakness while on statin. under control  . Leg fracture Dec 21,2011  . Chronic anticoagulation     on Xarelto  . GERD (gastroesophageal reflux disease)   . Parkinson's disease 2011  . Osteoporosis 07/2007  . PAF (paroxysmal atrial fibrillation)     s/p atrial fib ablation 11-11-11.   . Wrist fracture     right  . Chronic diastolic CHF (congestive heart failure)     Echocardiogram (11/24/12): Mild LVH, EF 60%.  . Heart murmur     slight  . Pacemaker   . Hypothyroidism   . Shortness of breath     "when walking at incline or stairs"  . Arthritis     thumbs  . Squamous carcinoma summer 2015    back of left thigh  . Cataract     bilateral    PAST SURGICAL HISTORY:   Past Surgical History  Procedure Laterality Date  . Knee surgery Left 2001  . US echocardiography  03-10-2008    Est EF 55-60%, Dr. Cathie Olden  . Cardiovascular stress test  06-06-2008    EF  73%  . Wrist surgery Right ~2009    metal rod in place  . Cardioversion  09/09/2011    Procedure: CARDIOVERSION;  Surgeon: Darlin Coco, MD;  Location: [facility] ENDOSCOPY;  Service: Cardiovascular;  Laterality: N/A;  to be done by dr. Mare Ferrari  . Tee without cardioversion  11/10/2011    Procedure: TRANSESOPHAGEAL ECHOCARDIOGRAM (TEE);  Surgeon: Thayer Headings, MD;  Location: [place];  Service: Cardiovascular;  Laterality: N/A;  . Cardioversion  02/18/2012    Procedure: CARDIOVERSION;  Surgeon: Thayer Headings, MD;  Location: [place];  Service: Cardiovascular;  Laterality: N/A;  . Cholecystectomy  1995  . Femur fracture surgery  2011    metal pin in place  . 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 Cardiac electrophysiology study and ablation  5/14, 7/14  . Skin cancer destruction  summer 2015  . Breast surgery      benign cyst removed  . Forearm surgery Left 2010    "opened arm to drain it"  . Tonsillectomy  as child  . Ventral hernia repair N/A 11/24/2013    Procedure: LAPAROSCOPIC VENTRAL HERNIA;  Surgeon: Michael Boston, MD;  Location: [facility];  Service: General;  Laterality: N/A;  . Insertion of mesh N/A 11/24/2013    Procedure: INSERTION OF MESH;  Surgeon: Michael Boston, MD;  Location: [facility];  Service: General;  Laterality: N/A;  . Atrial fibrillation ablation N/A 11/11/2011    Procedure: ATRIAL FIBRILLATION ABLATION;  Surgeon: Thompson Grayer, MD;  Location: [facility] CATH LAB;  Service: Cardiovascular;  Laterality: N/A;    SOCIAL HISTORY:   History   Social History  . Marital Status: Married    Spouse Name: N/A  Number of Children: N/A  . Years of Education: N/A   Occupational History  . Not on file.   Social History Main Topics  . Smoking status: Never Smoker   . Smokeless tobacco: Never Used  . Alcohol Use: Yes     Comment: 1-2 glasses of wine daily  . Drug Use: No  . Sexual Activity:    Partners: Male    Birth Control/ Protection: Post-menopausal    Other Topics Concern  . Not on file   Social History Narrative   Lives in [place].  Retired Licensed conveyancer.    FAMILY HISTORY:   Family Status  Relation Status Death Age  . Mother Deceased 31    AD   . Father Deceased 15    Valve-rheumatic fever as child  . Child Alive     healthy  . Child Alive     healthy    ROS:  A complete 10 system review of systems was obtained and was unremarkable apart from what is mentioned above.  PHYSICAL EXAMINATION:    VITALS:   Filed Vitals:   02/13/14 0942  BP: 100/60  Pulse: 84  Height: 5\' 2"  (1.575 m)  Weight: 150 lb (68.04 kg)    GEN:  The patient appears stated age and is in NAD. HEENT:  Normocephalic, atraumatic.  The mucous membranes are moist. The superficial temporal arteries are without ropiness or tenderness. CV:  RRR.   Lungs:  CTAB Neck/HEME:  There are no carotid bruits bilaterally.  Neurological examination:  Orientation: The patient is alert and oriented x3. Fund of knowledge is appropriate.  Recent and remote memory are intact.  Attention and concentration are normal.    Able to name objects and repeat phrases.   Movement examination: Tone: There is normal tone today Abnormal movements: There is a mild, intermittent resting tremor of the LEFT upper and RIGHT lower extremity.    When she ambulates, she has a tremor of the bilateral upper tremor these. Coordination:  There is no decremation with RAMs today. Gait and Station: The patient has no difficulty arising out of a deep-seated chair without the use of the hands. The patient's stride length is mildly decreased.  Tremor increases with ambulation.  The patient has a negative pull test.      ASSESSMENT/PLAN:  1.  idiopathic tremor predominant Parkinson's disease  -She was doing better  when she was taking carbidopa/levodopa 25/100, one tablet 3 times per day.  She has backed down to one tablet twice per day and definitely has more stiffness and more tremor.       -She will remain on the Azilect.  -Patient education was provided.  We discussed safety.  -encouraged exercise. 2.  Insomnia.  -She is on clonazepam 3.  Follow up in the next few months, sooner should new neurologic issues arise.

## 2014-02-20 ENCOUNTER — Ambulatory Visit
Admission: RE | Admit: 2014-02-20 | Discharge: 2014-02-20 | Disposition: A | Discharge status: Home / Self Care | Payer: Medicare Other | Source: Ambulatory Visit

## 2014-02-20 DIAGNOSIS — Z1231 Encounter for screening mammogram for malignant neoplasm of breast: Secondary | ICD-10-CM | POA: Diagnosis not present

## 2014-03-13 ENCOUNTER — Other Ambulatory Visit: Payer: Self-pay | Admitting: Cardiovascular Disease

## 2014-03-14 DIAGNOSIS — Z85828 Personal history of other malignant neoplasm of skin: Secondary | ICD-10-CM | POA: Diagnosis not present

## 2014-03-14 DIAGNOSIS — L570 Actinic keratosis: Secondary | ICD-10-CM | POA: Diagnosis not present

## 2014-03-14 DIAGNOSIS — L821 Other seborrheic keratosis: Secondary | ICD-10-CM | POA: Diagnosis not present

## 2014-03-14 DIAGNOSIS — D225 Melanocytic nevi of trunk: Secondary | ICD-10-CM | POA: Diagnosis not present

## 2014-03-14 DIAGNOSIS — B078 Other viral warts: Secondary | ICD-10-CM | POA: Diagnosis not present

## 2014-04-07 DIAGNOSIS — I4891 Unspecified atrial fibrillation: Secondary | ICD-10-CM | POA: Diagnosis not present

## 2014-04-07 DIAGNOSIS — R9431 Abnormal electrocardiogram [ECG] [EKG]: Secondary | ICD-10-CM | POA: Diagnosis not present

## 2014-04-07 DIAGNOSIS — K439 Ventral hernia without obstruction or gangrene: Secondary | ICD-10-CM | POA: Diagnosis not present

## 2014-05-11 DIAGNOSIS — G4700 Insomnia, unspecified: Secondary | ICD-10-CM | POA: Diagnosis not present

## 2014-05-11 DIAGNOSIS — I480 Paroxysmal atrial fibrillation: Secondary | ICD-10-CM | POA: Diagnosis not present

## 2014-05-11 DIAGNOSIS — G20 Parkinson's disease: Secondary | ICD-10-CM | POA: Diagnosis not present

## 2014-05-11 DIAGNOSIS — I10 Essential (primary) hypertension: Secondary | ICD-10-CM | POA: Diagnosis not present

## 2014-05-11 DIAGNOSIS — I509 Heart failure, unspecified: Secondary | ICD-10-CM | POA: Diagnosis not present

## 2014-05-11 DIAGNOSIS — Z6827 Body mass index (BMI) 27.0-27.9, adult: Secondary | ICD-10-CM | POA: Diagnosis not present

## 2014-05-11 DIAGNOSIS — R7301 Impaired fasting glucose: Secondary | ICD-10-CM | POA: Diagnosis not present

## 2014-05-11 DIAGNOSIS — E039 Hypothyroidism, unspecified: Secondary | ICD-10-CM | POA: Diagnosis not present

## 2014-05-17 DIAGNOSIS — M25562 Pain in left knee: Secondary | ICD-10-CM | POA: Diagnosis not present

## 2014-05-29 ENCOUNTER — Ambulatory Visit (INDEPENDENT_AMBULATORY_CARE_PROVIDER_SITE_OTHER): Payer: Medicare Other | Admitting: Obstetrics & Gynecology

## 2014-05-29 ENCOUNTER — Encounter: Payer: Self-pay | Admitting: Obstetrics & Gynecology

## 2014-05-29 VITALS — BP 104/68 | HR 68 | Resp 16 | Ht 61.5 in | Wt 151.0 lb

## 2014-05-29 DIAGNOSIS — Z124 Encounter for screening for malignant neoplasm of cervix: Secondary | ICD-10-CM

## 2014-05-29 DIAGNOSIS — Z01419 Encounter for gynecological examination (general) (routine) without abnormal findings: Secondary | ICD-10-CM

## 2014-05-29 NOTE — Progress Notes (Signed)
74 y.o. G2P2 MarriedCaucasianF here for annual exam.  No vaginal bleeding.  Pt had ventral hernia repair last year with Dr. Johney Maine.  Had a hematoma post operatively.  Denies vaginal bleeding.  PCP:  Dr. Joylene Draft.  Last appt about a month ago.  Seen every six months.  Annual physical will be this fall.     Neurologist is Dr. Carles Collet.  Being followed every six months.    Patient's last menstrual period was 02/04/1996.          Sexually active: Yes.    The current method of family planning is post menopausal status.   Exercising: Yes.    walking-not as often cortisone injection in knee recently Smoker:  no  Health Maintenance: Pap:  05/24/13 WNL History of abnormal Pap:  yes MMG:  02/20/14 3D-normal Colonoscopy:  2015-repeat in 5 years BMD:   12/14-on Forteo TDaP:  2008 Screening Labs: PCP, Hb today: PCP, Urine today: PCP   reports that she has never smoked. She has never used smokeless tobacco. She reports that she drinks alcohol. She reports that she does not use illicit drugs.  Past Medical History  Diagnosis Date  . Hypertension   . Hyperlipidemia     a. Hx of leg weakness while on statin. under control  . Leg fracture Dec 21,2011  . Chronic anticoagulation     on Xarelto  . GERD (gastroesophageal reflux disease)   . Parkinson's disease 2011  . Osteoporosis 07/2007  . PAF (paroxysmal atrial fibrillation)     s/p atrial fib ablation 11-11-11.   . Wrist fracture     right  . Chronic diastolic CHF (congestive heart failure)     Echocardiogram (11/24/12): Mild LVH, EF 60%.  . Heart murmur     slight  . Pacemaker   . Hypothyroidism   . Shortness of breath     "when walking at incline or stairs"  . Arthritis     thumbs  . Squamous carcinoma summer 2015    back of left thigh  . Cataract     bilateral    Past Surgical History  Procedure Laterality Date  . Knee surgery Left 2001  . US echocardiography  03-10-2008    Est EF 55-60%, Dr. Cathie Olden  . Cardiovascular stress test   06-06-2008    EF 73%  . Wrist surgery Right ~2009    metal rod in place  . Cardioversion  09/09/2011    Procedure: CARDIOVERSION;  Surgeon: Darlin Coco, MD;  Location: [facility] ENDOSCOPY;  Service: Cardiovascular;  Laterality: N/A;  to be done by dr. Mare Ferrari  . Tee without cardioversion  11/10/2011    Procedure: TRANSESOPHAGEAL ECHOCARDIOGRAM (TEE);  Surgeon: Thayer Headings, MD;  Location: [place];  Service: Cardiovascular;  Laterality: N/A;  . Cardioversion  02/18/2012    Procedure: CARDIOVERSION;  Surgeon: Thayer Headings, MD;  Location: [place];  Service: Cardiovascular;  Laterality: N/A;  . Cholecystectomy  1995  . Femur fracture surgery  2011    metal pin in place  . 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 Cardiac electrophysiology study and ablation  5/14, 7/14  . Skin cancer destruction  summer 2015  . Breast surgery      benign cyst removed  . Forearm surgery Left 2010    "opened arm to drain it"  . Tonsillectomy  as child  . Ventral hernia repair N/A 11/24/2013    Procedure: LAPAROSCOPIC VENTRAL HERNIA;  Surgeon: Michael Boston, MD;  Location: [facility];  Service: General;  Laterality: N/A;  . Insertion of mesh N/A 11/24/2013    Procedure: INSERTION OF MESH;  Surgeon: Michael Boston, MD;  Location: [facility];  Service: General;  Laterality: N/A;  . Atrial fibrillation ablation N/A 11/11/2011    Procedure: ATRIAL FIBRILLATION ABLATION;  Surgeon: Thompson Grayer, MD;  Location: [facility] CATH LAB;  Service: Cardiovascular;  Laterality: N/A;    Current Outpatient Prescriptions  Medication Sig Dispense Refill  . acetaminophen (TYLENOL) 500 MG tablet Take 1,000 mg by mouth every 4 (four) hours as needed for mild pain or headache.    . BD PEN NEEDLE NANO U/F 32G X 4 MM MISC 1 each daily.     [name] CALCIUM PO Take 1 tablet by mouth every morning.     . Calcium Polycarbophil (FIBERCON PO) Take 2 tablets by mouth every morning.     . carbidopa-levodopa (SINEMET IR) 25-100 MG per tablet  TAKE 1 TABLET BY MOUTH 3 (THREE) TIMES DAILY. (Patient taking differently: BID) 270 tablet 3  . clonazePAM (KLONOPIN) 0.5 MG tablet TAKE 1 TABLET BY MOUTH AT BEDTIME 30 tablet 5  . Coenzyme Q10 (CO Q 10 PO) Take 1 tablet by mouth every morning.     Mariane Baumgarten Sodium (COLACE PO) Take 1 capsule by mouth 2 (two) times daily.     [name] dofetilide (TIKOSYN) 250 MCG capsule Take 250 mcg by mouth 2 (two) times daily.     [name] ELIQUIS 5 MG TABS tablet 5 mg 2 (two) times daily.     [name] ezetimibe (ZETIA) 10 MG tablet Take 10 mg by mouth every other day. Taking the same day as the crestor    . FORTEO 600 MCG/2.4ML SOLN Inject 20 mcg into the skin every morning.     . furosemide (LASIX) 20 MG tablet TAKE 1 TABLET (20 MG TOTAL) BY MOUTH DAILY. 30 tablet 11  . levothyroxine (SYNTHROID, LEVOTHROID) 50 MCG tablet Take 50 mcg by mouth daily before breakfast.    . losartan (COZAAR) 25 MG tablet TAKE 1 TABLET EVERY DAY 90 tablet 1  . LUTEIN PO Take 1 tablet by mouth every morning.     [name] MAGNESIUM PO Take 1 tablet by mouth at bedtime.     . metoprolol succinate (TOPROL-XL) 25 MG 24 hr tablet Take 25 mg by mouth.    [name] NEXIUM 40 MG capsule Take 1 capsule by mouth every morning.     . potassium chloride (K-DUR,KLOR-CON) 10 MEQ tablet Take 10 mEq by mouth every morning.    . rasagiline (AZILECT) 1 MG TABS tablet Take 1 mg by mouth every morning.    . rosuvastatin (CRESTOR) 5 MG tablet Take 2.5 mg by mouth every other day.    . valACYclovir (VALTREX) 500 MG tablet Take 500 mg by mouth daily as needed.    . Vitamin D, Ergocalciferol, (DRISDOL) 50000 UNITS CAPS Take 50,000 Units by mouth every 7 (seven) days.      No current facility-administered medications for this visit.    Family History  Problem Relation Age of Onset  . Alzheimer's disease Mother   . Heart failure Father     ROS:  Pertinent items are noted in HPI.  Otherwise, a comprehensive ROS was negative.  Exam:   BP 104/68 mmHg  Pulse 68  Resp 16  Ht 5'  1.5" (1.562 m)  Wt 151 lb (68.493 kg)  BMI 28.07 kg/m2  LMP 02/04/1996  Weight change: @WEIGHTCHANGE @ Height:   Height:  5' 1.5" (156.2 cm)  Ht Readings from Last 3 Encounters:  05/29/14 5' 1.5" (1.562 m)  02/13/14 5\' 2"  (1.575 m)  11/24/13 5\' 2"  (1.575 m)    General appearance: alert, cooperative and appears stated age Head: Normocephalic, without obvious abnormality, atraumatic Neck: no adenopathy, supple, symmetrical, trachea midline and thyroid normal to inspection and palpation Lungs: clear to auscultation bilaterally Breasts: normal appearance, no masses or tenderness Heart: regular rate and rhythm Abdomen: soft, non-tender; bowel sounds normal; no masses,  no organomegaly Extremities: extremities normal, atraumatic, no cyanosis or edema Skin: Skin color, texture, turgor normal. No rashes or lesions Lymph nodes: Cervical, supraclavicular, and axillary nodes normal. No abnormal inguinal nodes palpated Neurologic: Grossly normal   Pelvic: External genitalia:  no lesions              Urethra:  normal appearing urethra with no masses, tenderness or lesions              Bartholins and Skenes: normal                 Vagina: normal appearing vagina with normal color and discharge, no lesions              Cervix: no lesions              Pap taken: Yes.   Bimanual Exam:  Uterus:  normal size, contour, position, consistency, mobility, non-tender              Adnexa: normal adnexa and no mass, fullness, tenderness               Rectovaginal: Confirms               Anus:  normal sphincter tone, no lesions  Chaperone was present for exam.  A:  Well Woman with normal exam PMP, no HRT H/O afib H/O osteoporosis, on Forteo Parkinson's Disease.  Sees Dr. Carles Collet every four year.  P: Mammogram yearly.  pap smear obtained. Pap only.  Pt desires yearly pap smear. Labs with Dr. Joylene Draft, cardiologist, Dr. Carles Collet. return annually or prn

## 2014-05-30 LAB — IPS PAP SMEAR ONLY

## 2014-06-07 ENCOUNTER — Encounter: Payer: Self-pay | Admitting: Neurology

## 2014-06-07 ENCOUNTER — Ambulatory Visit (INDEPENDENT_AMBULATORY_CARE_PROVIDER_SITE_OTHER): Payer: Medicare Other | Admitting: Neurology

## 2014-06-07 VITALS — BP 98/66 | HR 92 | Ht 62.0 in | Wt 150.0 lb

## 2014-06-07 DIAGNOSIS — G20 Parkinson's disease: Secondary | ICD-10-CM

## 2014-06-07 DIAGNOSIS — G4700 Insomnia, unspecified: Secondary | ICD-10-CM | POA: Diagnosis not present

## 2014-06-07 NOTE — Progress Notes (Signed)
[name] [name] was seen today in the movement disorders clinic for neurologic consultation at the request of [name],[name], MD.  The consultation is for the evaluation of PD.  She has previously seen [name] and [name].  I have some of [name] notes.  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  She had a normal MRI brain at that time according to notes from Dr. love.  In 2011, her diagnosis was changed to Parkinson's disease and Azilect was added.  She had a second opinion at [facility] on 07/25/2009 and [name] also thought that she had Parkinson's disease.  In January, 2014 [name] added Artane, presumably for tremor.  She does think that it has helped the tremor.  02/22/13 update:  The pt is f/u today re: PD.  After our discussion last visit, she decided to stop the artane and states that while she noted a little increase in tremor, it hasn't been bad.  I had wanted to start her on carbidopa/levodopa 25/100 but she wanted to hold on that last vist.  Balance is good but she has felt more slow and stiff.  No falls.  No hallucinations.  No n/v.  No near syncope.  Not exercising faithfully.  06/20/13 update:  The pt is f/u today re: PD.  She was started on carbidopa/levodopa 25/100 last visit in January in addition to the azilect that she was already on.  She just returned from a trip from the [place] and was so surprised that she could do all of the planned excursions.  She felt great that she could do all the hiking and could get in and out of the zodiac boat.  She states that she is so glad she started taking the levodopa and thinks that her mood is better.  Her coordination and tremor are better but she still has some tremor.  Some minor stomach upset.  Pharmacist told her to take it with food and she admits that she is taking it with protein.  She last took levodopa at 7:30 am and was examined at 10am.  She is having difficulty staying asleep.  10/13/13 update:  Pt is on carbidopa/levodopa 25/100  and is supposed to be on one tid but admits that she is only taking 1/2 po bid. Did this because of GI upset.   She is having more tremor especially when walking in the L hand; she is not sure if it is any particular time of day.  It doesn't bother her too much.  Some increased difficulty buttoning clothing.  Has a URI and is leaving for [place] on Friday.  She is going for 3 weeks.    02/13/14 update:  The patient returns today for follow-up.  She reports that she is not moving as fluidly as she previously was.  Last time I told her that she needed to really increase her medication to one tablet 3 times per day but she did not do that and she is only taking one twice a day; interestingly she gets the first and the middle of the day dosage in and forgets the last.  She does get nighttime (leg/toe) cramping.  She did have a ventral hernia repair on 11/24/2013.  States that she had a difficult time recovering as she had a hematoma after.  Could not walk after surgery and has not been back to exercise but knows that she needs to.  No falls.  Little tremor.    06/07/14 update:  The patient is f/u for PD.  She is still on carbidopa/levodopa 25/100 twice per day.  She did better clinically on tid dosing but backed down on it to bid on her own.   She states that "sometimes" she takes the third dose but she often is now missing the middle of the day dose.  Sometimes, she only takes it once a day.   She is walking well; no falls.  She is exercising but states that she just had a cortisone injection into the left knee so that was a little limited.  She is having an ablation for a-fib next tues.  She states that it will be done at [facility] and it is a 3 hr procedure (because it is internal not external).  No lightheadedness.  She is still on azilect faithfully.  She takes klonopin about 1-2 times per week for insomnia.    She was hospitalized since last visit with CHF. I reviewed her MRI of the brain from April, 2009.   It was unremarkable.  Formal report is below:  Clinical Data: Syncopal episode. Confusion.  MRI HEAD WITHOUT CONTRAST  MRA HEAD WITHOUT CONTRAST  Technique: Multiplanar, multiecho pulse sequences of the brain and  surrounding structures were obtained according to standard protocol  with intravenous contrast. Angiographic images of the head were  obtained using MRA technique without contrast.  Comparison: No comparison MR. Prior CT 05/20/2007.  MRI HEAD  Findings: No acute infarct. No intracranial hemorrhage. No  hydrocephalous. No intracranial mass lesion detected on this  unenhanced exam. Major intracranial vascular structures are  patent. The ectatic vertebral arteries cause slight indentation  upon the medulla. Very minimal partial opacification mastoid air  cells.  IMPRESSION:  No acute infarct.  MRA HEAD  Findings: Ectasia of the distal vertical segment of the carotid  arteries bilaterally. Mild ectasia of the cavernous segment of the  internal carotid arteries bilaterally. Minimal narrowing proximal  A1 segment of the anterior cerebral arteries bilaterally.  Ectatic vertebral arteries and basilar artery. The AICAs are not  seen on either side. Remainder of major intracranial vascular  structures are patent. Minimal branch vessel irregularity. No  aneurysm visualized.  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  PREVIOUS MEDICATIONS: Azilect and artane  ALLERGIES:   Allergies  Allergen Reactions  . Statins     Leg weakness - but tolerating low dose Crestor every other day    CURRENT MEDICATIONS:  Current Outpatient Prescriptions on File Prior to Visit  Medication Sig Dispense Refill  . acetaminophen (TYLENOL) 500 MG tablet Take 1,000 mg by mouth every 4 (four) hours as needed for mild pain or headache.    [name] CALCIUM PO Take 1 tablet by mouth every morning.     . Calcium Polycarbophil (FIBERCON PO) Take 2 tablets by  mouth every morning.     . carbidopa-levodopa (SINEMET IR) 25-100 MG per tablet TAKE 1 TABLET BY MOUTH 3 (THREE) TIMES DAILY. (Patient taking differently: BID) 270 tablet 3  . clonazePAM (KLONOPIN) 0.5 MG tablet TAKE 1 TABLET BY MOUTH AT BEDTIME 30 tablet 5  . Coenzyme Q10 (CO Q 10 PO) Take 1 tablet by mouth every morning.     Mariane Baumgarten Sodium (COLACE PO) Take 1 capsule by mouth 2 (two) times daily.     [name] dofetilide (TIKOSYN) 250 MCG capsule Take 250 mcg by mouth 2 (two) times daily.     [name] ELIQUIS 5 MG TABS tablet 5 mg  2 (two) times daily.     [name] ezetimibe (ZETIA) 10 MG tablet Take 10 mg by mouth every other day. Taking the same day as the crestor    . FORTEO 600 MCG/2.4ML SOLN Inject 20 mcg into the skin every morning.     . furosemide (LASIX) 20 MG tablet TAKE 1 TABLET (20 MG TOTAL) BY MOUTH DAILY. 30 tablet 11  . levothyroxine (SYNTHROID, LEVOTHROID) 50 MCG tablet Take 50 mcg by mouth daily before breakfast.    . losartan (COZAAR) 25 MG tablet TAKE 1 TABLET EVERY DAY 90 tablet 1  . LUTEIN PO Take 1 tablet by mouth every morning.     [name] MAGNESIUM PO Take 1 tablet by mouth at bedtime.     . metoprolol succinate (TOPROL-XL) 25 MG 24 hr tablet Take 25 mg by mouth.    . potassium chloride (K-DUR,KLOR-CON) 10 MEQ tablet Take 10 mEq by mouth every morning.    . rasagiline (AZILECT) 1 MG TABS tablet Take 1 mg by mouth every morning.    . rosuvastatin (CRESTOR) 5 MG tablet Take 2.5 mg by mouth every other day.    . valACYclovir (VALTREX) 500 MG tablet Take 500 mg by mouth daily as needed.    . Vitamin D, Ergocalciferol, (DRISDOL) 50000 UNITS CAPS Take 50,000 Units by mouth every 7 (seven) days.     . BD PEN NEEDLE NANO U/F 32G X 4 MM MISC 1 each daily.      No current facility-administered medications on file prior to visit.    PAST MEDICAL HISTORY:   Past Medical History  Diagnosis Date  . Hypertension   . Hyperlipidemia     a. Hx of leg weakness while on statin. under control  . Leg  fracture Dec 21,2011  . Chronic anticoagulation     on Xarelto  . GERD (gastroesophageal reflux disease)   . Parkinson's disease 2011  . Osteoporosis 07/2007  . PAF (paroxysmal atrial fibrillation)     s/p atrial fib ablation 11-11-11.   . Wrist fracture     right  . Chronic diastolic CHF (congestive heart failure)     Echocardiogram (11/24/12): Mild LVH, EF 60%.  . Heart murmur     slight  . Pacemaker   . Hypothyroidism   . Shortness of breath     "when walking at incline or stairs"  . Arthritis     thumbs  . Squamous carcinoma summer 2015    back of left thigh  . Cataract     bilateral    PAST SURGICAL HISTORY:   Past Surgical History  Procedure Laterality Date  . Knee surgery Left 2001  . US echocardiography  03-10-2008    Est EF 55-60%, Dr. Cathie Olden  . Cardiovascular stress test  06-06-2008    EF 73%  . Wrist surgery Right ~2009    metal rod in place  . Cardioversion  09/09/2011    Procedure: CARDIOVERSION;  Surgeon: Darlin Coco, MD;  Location: [facility] ENDOSCOPY;  Service: Cardiovascular;  Laterality: N/A;  to be done by dr. Mare Ferrari  . Tee without cardioversion  11/10/2011    Procedure: TRANSESOPHAGEAL ECHOCARDIOGRAM (TEE);  Surgeon: Thayer Headings, MD;  Location: [place];  Service: Cardiovascular;  Laterality: N/A;  . Cardioversion  02/18/2012    Procedure: CARDIOVERSION;  Surgeon: Thayer Headings, MD;  Location: [place];  Service: Cardiovascular;  Laterality: N/A;  . Cholecystectomy  1995  . Femur fracture surgery  2011    metal pin  in place  . 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 Cardiac electrophysiology study and ablation  5/14, 7/14  . Skin cancer destruction  summer 2015  . Breast surgery      benign cyst removed  . Forearm surgery Left 2010    "opened arm to drain it"  . Tonsillectomy  as child  . Ventral hernia repair N/A 11/24/2013    Procedure: LAPAROSCOPIC VENTRAL HERNIA;  Surgeon: Michael Boston, MD;  Location: [facility] ORS;  Service:  General;  Laterality: N/A;  . Insertion of mesh N/A 11/24/2013    Procedure: INSERTION OF MESH;  Surgeon: Michael Boston, MD;  Location: [facility] ORS;  Service: General;  Laterality: N/A;  . Atrial fibrillation ablation N/A 11/11/2011    Procedure: ATRIAL FIBRILLATION ABLATION;  Surgeon: Thompson Grayer, MD;  Location: [facility] CATH LAB;  Service: Cardiovascular;  Laterality: N/A;    SOCIAL HISTORY:   History   Social History  . Marital Status: Married    Spouse Name: N/A  . Number of Children: N/A  . Years of Education: N/A   Occupational History  . Not on file.   Social History Main Topics  . Smoking status: Never Smoker   . Smokeless tobacco: Never Used  . Alcohol Use: Yes     Comment: 1-2 glasses of wine daily  . Drug Use: No  . Sexual Activity:    Partners: Male    Birth Control/ Protection: Post-menopausal   Other Topics Concern  . Not on file   Social History Narrative   Lives in [place].  Retired Licensed conveyancer.    FAMILY HISTORY:   Family Status  Relation Status Death Age  . Mother Deceased 84    AD   . Father Deceased 39    Valve-rheumatic fever as child  . Child Alive     healthy  . Child Alive     healthy    ROS:  A complete 10 system review of systems was obtained and was unremarkable apart from what is mentioned above.  PHYSICAL EXAMINATION:    VITALS:   Filed Vitals:   06/07/14 0901  BP: 98/66  Pulse: 92  Height: 5\' 2"  (1.575 m)  Weight: 150 lb (68.04 kg)    GEN:  The patient appears stated age and is in NAD. HEENT:  Normocephalic, atraumatic.  The mucous membranes are moist. The superficial temporal arteries are without ropiness or tenderness.   Neurological examination:  Orientation: The patient is alert and oriented x3. Fund of knowledge is appropriate.     Movement examination: Tone: There is normal tone today Abnormal movements: There is a mild, intermittent resting tremor of the LEFT upper and RIGHT lower extremity.    When she ambulates, she  has a tremor of the bilateral upper tremor these.  Tremor increases with distraction procedures.   Coordination:  There is no decremation with RAMs today. Gait and Station: The patient has no difficulty arising out of a deep-seated chair without the use of the hands. The patient's stride length is mildly decreased.  Tremor increases with ambulation.  The patient has a negative pull test.      ASSESSMENT/PLAN:  1.  idiopathic tremor predominant Parkinson's disease  -She was doing better  when she was taking carbidopa/levodopa 25/100, one tablet 3 times per day.  She has backed down to one tablet twice per day and definitely has more stiffness and more tremor.    Encouraged her to try and get all 3  dosages in.  -She is getting ready to have an internal cardiac ablation under general anesthesia.  Talked about anesthesia risks with PD.  Pt education provided.  Greater than 50% of 25 min visit in counseling.  -She will remain on the Azilect.  -encouraged exercise. 2.  Insomnia.  -She is on clonazepam prn 3.  Follow up in the next few months, sooner should new neurologic issues arise.

## 2014-06-12 DIAGNOSIS — I4891 Unspecified atrial fibrillation: Secondary | ICD-10-CM | POA: Diagnosis not present

## 2014-06-12 DIAGNOSIS — G20 Parkinson's disease: Secondary | ICD-10-CM | POA: Diagnosis not present

## 2014-06-12 DIAGNOSIS — I341 Nonrheumatic mitral (valve) prolapse: Secondary | ICD-10-CM | POA: Diagnosis not present

## 2014-06-12 DIAGNOSIS — I10 Essential (primary) hypertension: Secondary | ICD-10-CM | POA: Diagnosis not present

## 2014-06-12 DIAGNOSIS — Z01818 Encounter for other preprocedural examination: Secondary | ICD-10-CM | POA: Diagnosis not present

## 2014-06-13 DIAGNOSIS — I484 Atypical atrial flutter: Secondary | ICD-10-CM | POA: Diagnosis not present

## 2014-06-13 DIAGNOSIS — I4891 Unspecified atrial fibrillation: Secondary | ICD-10-CM | POA: Diagnosis not present

## 2014-06-13 DIAGNOSIS — I5189 Other ill-defined heart diseases: Secondary | ICD-10-CM | POA: Insufficient documentation

## 2014-06-13 DIAGNOSIS — G20 Parkinson's disease: Secondary | ICD-10-CM | POA: Diagnosis not present

## 2014-06-13 DIAGNOSIS — M810 Age-related osteoporosis without current pathological fracture: Secondary | ICD-10-CM | POA: Diagnosis not present

## 2014-06-13 DIAGNOSIS — Z950 Presence of cardiac pacemaker: Secondary | ICD-10-CM | POA: Diagnosis not present

## 2014-06-13 DIAGNOSIS — E039 Hypothyroidism, unspecified: Secondary | ICD-10-CM | POA: Diagnosis not present

## 2014-06-13 DIAGNOSIS — I10 Essential (primary) hypertension: Secondary | ICD-10-CM | POA: Diagnosis not present

## 2014-06-14 ENCOUNTER — Ambulatory Visit: Payer: Medicare Other | Admitting: Neurology

## 2014-06-14 DIAGNOSIS — M810 Age-related osteoporosis without current pathological fracture: Secondary | ICD-10-CM | POA: Diagnosis not present

## 2014-06-14 DIAGNOSIS — E039 Hypothyroidism, unspecified: Secondary | ICD-10-CM | POA: Diagnosis not present

## 2014-06-14 DIAGNOSIS — I4891 Unspecified atrial fibrillation: Secondary | ICD-10-CM | POA: Diagnosis not present

## 2014-06-14 DIAGNOSIS — I10 Essential (primary) hypertension: Secondary | ICD-10-CM | POA: Diagnosis not present

## 2014-06-14 DIAGNOSIS — I484 Atypical atrial flutter: Secondary | ICD-10-CM | POA: Diagnosis not present

## 2014-06-14 DIAGNOSIS — G20 Parkinson's disease: Secondary | ICD-10-CM | POA: Diagnosis not present

## 2014-07-19 ENCOUNTER — Other Ambulatory Visit: Payer: Self-pay | Admitting: Cardiovascular Disease

## 2014-07-19 DIAGNOSIS — M25562 Pain in left knee: Secondary | ICD-10-CM | POA: Diagnosis not present

## 2014-07-19 DIAGNOSIS — M1712 Unilateral primary osteoarthritis, left knee: Secondary | ICD-10-CM | POA: Diagnosis not present

## 2014-07-21 DIAGNOSIS — I4891 Unspecified atrial fibrillation: Secondary | ICD-10-CM | POA: Diagnosis not present

## 2014-08-13 ENCOUNTER — Other Ambulatory Visit: Payer: Self-pay | Admitting: Cardiovascular Disease

## 2014-08-18 DIAGNOSIS — M25472 Effusion, left ankle: Secondary | ICD-10-CM | POA: Diagnosis not present

## 2014-08-18 DIAGNOSIS — M25572 Pain in left ankle and joints of left foot: Secondary | ICD-10-CM | POA: Diagnosis not present

## 2014-08-18 DIAGNOSIS — I10 Essential (primary) hypertension: Secondary | ICD-10-CM | POA: Diagnosis not present

## 2014-08-18 DIAGNOSIS — R2242 Localized swelling, mass and lump, left lower limb: Secondary | ICD-10-CM | POA: Diagnosis not present

## 2014-08-18 DIAGNOSIS — M7989 Other specified soft tissue disorders: Secondary | ICD-10-CM | POA: Diagnosis not present

## 2014-09-11 ENCOUNTER — Other Ambulatory Visit: Payer: Self-pay | Admitting: Cardiovascular Disease

## 2014-09-12 DIAGNOSIS — M810 Age-related osteoporosis without current pathological fracture: Secondary | ICD-10-CM | POA: Diagnosis not present

## 2014-09-12 DIAGNOSIS — G20 Parkinson's disease: Secondary | ICD-10-CM | POA: Diagnosis not present

## 2014-09-12 DIAGNOSIS — E039 Hypothyroidism, unspecified: Secondary | ICD-10-CM | POA: Diagnosis not present

## 2014-09-12 DIAGNOSIS — I10 Essential (primary) hypertension: Secondary | ICD-10-CM | POA: Diagnosis not present

## 2014-09-12 DIAGNOSIS — Z6826 Body mass index (BMI) 26.0-26.9, adult: Secondary | ICD-10-CM | POA: Diagnosis not present

## 2014-09-12 DIAGNOSIS — R7301 Impaired fasting glucose: Secondary | ICD-10-CM | POA: Diagnosis not present

## 2014-09-12 DIAGNOSIS — I480 Paroxysmal atrial fibrillation: Secondary | ICD-10-CM | POA: Diagnosis not present

## 2014-09-12 DIAGNOSIS — I509 Heart failure, unspecified: Secondary | ICD-10-CM | POA: Diagnosis not present

## 2014-10-08 ENCOUNTER — Other Ambulatory Visit: Payer: Self-pay | Admitting: Cardiovascular Disease

## 2014-10-11 ENCOUNTER — Ambulatory Visit (INDEPENDENT_AMBULATORY_CARE_PROVIDER_SITE_OTHER): Payer: Medicare Other | Admitting: Neurology

## 2014-10-11 ENCOUNTER — Encounter: Payer: Self-pay | Admitting: Neurology

## 2014-10-11 VITALS — BP 102/64 | HR 108 | Ht 62.0 in | Wt 147.0 lb

## 2014-10-11 DIAGNOSIS — G4700 Insomnia, unspecified: Secondary | ICD-10-CM

## 2014-10-11 DIAGNOSIS — G20 Parkinson's disease: Secondary | ICD-10-CM | POA: Diagnosis not present

## 2014-10-11 MED ORDER — CLONAZEPAM 0.5 MG PO TABS: 0.5000 mg | ORAL_TABLET | Freq: Every day | ORAL | Status: DC

## 2014-10-11 NOTE — Progress Notes (Signed)
[name] [name] was seen today in the movement disorders clinic for neurologic consultation at the request of [name],[name], MD.  The consultation is for the evaluation of PD.  She has previously seen [name] and [name].  I have some of [name] notes.  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  She had a normal MRI brain at that time according to notes from Dr. love.  In 2011, her diagnosis was changed to Parkinson's disease and Azilect was added.  She had a second opinion at [facility] on 07/25/2009 and [name] also thought that she had Parkinson's disease.  In January, 2014 [name] added Artane, presumably for tremor.  She does think that it has helped the tremor.  02/22/13 update:  The pt is f/u today re: PD.  After our discussion last visit, she decided to stop the artane and states that while she noted a little increase in tremor, it hasn't been bad.  I had wanted to start her on carbidopa/levodopa 25/100 but she wanted to hold on that last vist.  Balance is good but she has felt more slow and stiff.  No falls.  No hallucinations.  No n/v.  No near syncope.  Not exercising faithfully.  06/20/13 update:  The pt is f/u today re: PD.  She was started on carbidopa/levodopa 25/100 last visit in January in addition to the azilect that she was already on.  She just returned from a trip from the [place] and was so surprised that she could do all of the planned excursions.  She felt great that she could do all the hiking and could get in and out of the zodiac boat.  She states that she is so glad she started taking the levodopa and thinks that her mood is better.  Her coordination and tremor are better but she still has some tremor.  Some minor stomach upset.  Pharmacist told her to take it with food and she admits that she is taking it with protein.  She last took levodopa at 7:30 am and was examined at 10am.  She is having difficulty staying asleep.  10/13/13 update:  Pt is on carbidopa/levodopa 25/100  and is supposed to be on one tid but admits that she is only taking 1/2 po bid. Did this because of GI upset.   She is having more tremor especially when walking in the L hand; she is not sure if it is any particular time of day.  It doesn't bother her too much.  Some increased difficulty buttoning clothing.  Has a URI and is leaving for [place] on Friday.  She is going for 3 weeks.    02/13/14 update:  The patient returns today for follow-up.  She reports that she is not moving as fluidly as she previously was.  Last time I told her that she needed to really increase her medication to one tablet 3 times per day but she did not do that and she is only taking one twice a day; interestingly she gets the first and the middle of the day dosage in and forgets the last.  She does get nighttime (leg/toe) cramping.  She did have a ventral hernia repair on 11/24/2013.  States that she had a difficult time recovering as she had a hematoma after.  Could not walk after surgery and has not been back to exercise but knows that she needs to.  No falls.  Little tremor.    06/07/14 update:  The patient is f/u for PD.  She is still on carbidopa/levodopa 25/100 twice per day.  She did better clinically on tid dosing but backed down on it to bid on her own.   She states that "sometimes" she takes the third dose but she often is now missing the middle of the day dose.  Sometimes, she only takes it once a day.   She is walking well; no falls.  She is exercising but states that she just had a cortisone injection into the left knee so that was a little limited.  She is having an ablation for a-fib next tues.  She states that it will be done at [facility] and it is a 3 hr procedure (because it is internal not external).  No lightheadedness.  She is still on azilect faithfully.  She takes klonopin about 1-2 times per week for insomnia.    10/11/14 update:  The patient follows up today for Parkinson's disease.  Last visit, I encouraged her  to go back up on her carbidopa/levodopa from twice per day to 3 times per day as she was more stiff and rigid last visit.  She may or may not do that but finds that most days she takes it bid. She has had some toe curling but is unsure if it is related to timing of meds.   She remains on Azilect as well as clonazepam for her insomnia.  She reports that she sleeps with this but she admits she doesn't take it regularly.  After some discussion today, we realized that she acidentally was taking temazepam instead of the klonopin.    She reports that she had another cardiac ablation since last visit and thought that it originally didn't work but now she thinks that it is.    PREVIOUS MEDICATIONS: Azilect and artane  ALLERGIES:   Allergies  Allergen Reactions  . Statins     Leg weakness - but tolerating low dose Crestor every other day    CURRENT MEDICATIONS:  Current Outpatient Prescriptions on File Prior to Visit  Medication Sig Dispense Refill  . acetaminophen (TYLENOL) 500 MG tablet Take 1,000 mg by mouth every 4 (four) hours as needed for mild pain or headache.    . BD PEN NEEDLE NANO U/F 32G X 4 MM MISC 1 each daily.     [name] CALCIUM PO Take 1 tablet by mouth every morning.     . Calcium Polycarbophil (FIBERCON PO) Take 2 tablets by mouth every morning.     . carbidopa-levodopa (SINEMET IR) 25-100 MG per tablet TAKE 1 TABLET BY MOUTH 3 (THREE) TIMES DAILY. (Patient taking differently: BID) 270 tablet 3  . clonazePAM (KLONOPIN) 0.5 MG tablet TAKE 1 TABLET BY MOUTH AT BEDTIME 30 tablet 5  . Coenzyme Q10 (CO Q 10 PO) Take 1 tablet by mouth every morning.     Mariane Baumgarten Sodium (COLACE PO) Take 1 capsule by mouth 2 (two) times daily.     [name] dofetilide (TIKOSYN) 250 MCG capsule Take 250 mcg by mouth 2 (two) times daily.     [name] ELIQUIS 5 MG TABS tablet 5 mg 2 (two) times daily.     [name] ezetimibe (ZETIA) 10 MG tablet Take 10 mg by mouth every other day. Taking the same day as the crestor    . FORTEO 600  MCG/2.4ML SOLN Inject 20 mcg into the skin every morning.     . furosemide (LASIX) 20 MG tablet TAKE 1 TABLET (20 MG TOTAL) BY  MOUTH DAILY. 30 tablet 11  . levothyroxine (SYNTHROID, LEVOTHROID) 50 MCG tablet Take 50 mcg by mouth daily before breakfast.    . losartan (COZAAR) 25 MG tablet TAKE 1 TABLET EVERY DAY 14 tablet 0  . LUTEIN PO Take 1 tablet by mouth every morning.     [name] MAGNESIUM PO Take 1 tablet by mouth at bedtime.     . metoprolol succinate (TOPROL-XL) 25 MG 24 hr tablet Take 25 mg by mouth.    . potassium chloride (K-DUR,KLOR-CON) 10 MEQ tablet Take 10 mEq by mouth every morning.    . rasagiline (AZILECT) 1 MG TABS tablet Take 1 mg by mouth every morning.    . rosuvastatin (CRESTOR) 5 MG tablet Take 2.5 mg by mouth every other day.    . valACYclovir (VALTREX) 500 MG tablet Take 500 mg by mouth daily as needed.    . Vitamin D, Ergocalciferol, (DRISDOL) 50000 UNITS CAPS Take 50,000 Units by mouth every 7 (seven) days.      No current facility-administered medications on file prior to visit.    PAST MEDICAL HISTORY:   Past Medical History  Diagnosis Date  . Hypertension   . Hyperlipidemia     a. Hx of leg weakness while on statin. under control  . Leg fracture Dec 21,2011  . Chronic anticoagulation     on Xarelto  . GERD (gastroesophageal reflux disease)   . Parkinson's disease 2011  . Osteoporosis 07/2007  . PAF (paroxysmal atrial fibrillation)     s/p atrial fib ablation 11-11-11.   . Wrist fracture     right  . Chronic diastolic CHF (congestive heart failure)     Echocardiogram (11/24/12): Mild LVH, EF 60%.  . Heart murmur     slight  . Pacemaker   . Hypothyroidism   . Shortness of breath     "when walking at incline or stairs"  . Arthritis     thumbs  . Squamous carcinoma summer 2015    back of left thigh  . Cataract     bilateral    PAST SURGICAL HISTORY:   Past Surgical History  Procedure Laterality Date  . Knee surgery Left 2001  . US  echocardiography  03-10-2008    Est EF 55-60%, Dr. Cathie Olden  . Cardiovascular stress test  06-06-2008    EF 73%  . Wrist surgery Right ~2009    metal rod in place  . Cardioversion  09/09/2011    Procedure: CARDIOVERSION;  Surgeon: Darlin Coco, MD;  Location[facility] ENDOSCOPY;  Service: Cardiovascular;  Laterality: N/A;  to be done by dr. Mare Ferrari  . Tee without cardioversion  11/10/2011    Procedure: TRANSESOPHAGEAL ECHOCARDIOGRAM (TEE);  Surgeon: Thayer Headings, MD;  Location: [place];  Service: Cardiovascular;  Laterality: N/A;  . Cardioversion  02/18/2012    Procedure: CARDIOVERSION;  Surgeon: Thayer Headings, MD;  Location: [place];  Service: Cardiovascular;  Laterality: N/A;  . Cholecystectomy  1995  . Femur fracture surgery  2011    metal pin in place  . 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 Cardiac electrophysiology study and ablation  5/14, 7/14  . Skin cancer destruction  summer 2015  . Breast surgery      benign cyst removed  . Forearm surgery Left 2010    "opened arm to drain it"  . Tonsillectomy  as child  . Ventral hernia repair N/A 11/24/2013    Procedure: LAPAROSCOPIC VENTRAL HERNIA;  Surgeon: Remo Lipps  Gross, MD;  Location: [facility];  Service: General;  Laterality: N/A;  . Insertion of mesh N/A 11/24/2013    Procedure: INSERTION OF MESH;  Surgeon: Michael Boston, MD;  Location: [facility];  Service: General;  Laterality: N/A;  . Atrial fibrillation ablation N/A 11/11/2011    Procedure: ATRIAL FIBRILLATION ABLATION;  Surgeon: Thompson Grayer, MD;  Location: [facility] CATH LAB;  Service: Cardiovascular;  Laterality: N/A;    SOCIAL HISTORY:   Social History   Social History  . Marital Status: Married    Spouse Name: N/A  . Number of Children: N/A  . Years of Education: N/A   Occupational History  . Not on file.   Social History Main Topics  . Smoking status: Never Smoker   . Smokeless tobacco: Never Used  . Alcohol Use: Yes     Comment: 1-2 glasses of wine daily   . Drug Use: No  . Sexual Activity:    Partners: Male    Birth Control/ Protection: Post-menopausal   Other Topics Concern  . Not on file   Social History Narrative   Lives in [place].  Retired Licensed conveyancer.    FAMILY HISTORY:   Family Status  Relation Status Death Age  . Mother Deceased 13    AD   . Father Deceased 24    Valve-rheumatic fever as child  . Child Alive     healthy  . Child Alive     healthy    ROS:  A complete 10 system review of systems was obtained and was unremarkable apart from what is mentioned above.  PHYSICAL EXAMINATION:    VITALS:   Filed Vitals:   10/11/14 0922  BP: 102/64  Pulse: 108  Height: 5\' 2"  (1.575 m)  Weight: 147 lb (66.679 kg)    GEN:  The patient appears stated age and is in NAD. HEENT:  Normocephalic, atraumatic.  The mucous membranes are moist. The superficial temporal arteries are without ropiness or tenderness. CV:  irreg irreg Lungs:  CTAB  Neurological examination:  Orientation: The patient is alert and oriented x3. Fund of knowledge is appropriate.     Movement examination: Tone: There is normal tone today Abnormal movements: There is a mild, intermittent resting tremor of the right upper extremity.    When she ambulates, she has a tremor of the bilateral upper extremity.  Tremor increases with distraction procedures.   Coordination:  There is no decremation with RAMs today. Gait and Station: The patient has no difficulty arising out of a deep-seated chair without the use of the hands. The patient's stride length is mildly decreased.  Tremor increases with ambulation.  The patient has a negative pull test.      ASSESSMENT/PLAN:  1.  idiopathic tremor predominant Parkinson's disease  -Encouraged her again to take carbidopa/levodopa 25/100, one tablet 3 times per day.  She is often taking it twice per day.    Encouraged her to try and get all 3 dosages in.  -She will remain on the Azilect.  -encouraged exercise. 2.   Insomnia.  -She accidentally has been taking an old RX of temazepam and told her to d/c that and start taking klonopin more regularly at night.   3.  Follow up in the next few months, sooner should new neurologic issues arise.   Much greater than 50% of this visit was spent in counseling with the patient and the family.  Total face to face time:  25 min

## 2014-10-12 DIAGNOSIS — M25472 Effusion, left ankle: Secondary | ICD-10-CM | POA: Diagnosis not present

## 2014-10-12 DIAGNOSIS — M25562 Pain in left knee: Secondary | ICD-10-CM | POA: Diagnosis not present

## 2014-10-12 DIAGNOSIS — M1712 Unilateral primary osteoarthritis, left knee: Secondary | ICD-10-CM | POA: Diagnosis not present

## 2014-10-12 DIAGNOSIS — M25572 Pain in left ankle and joints of left foot: Secondary | ICD-10-CM | POA: Diagnosis not present

## 2014-11-06 ENCOUNTER — Other Ambulatory Visit: Payer: Self-pay | Admitting: Neurology

## 2014-11-06 ENCOUNTER — Other Ambulatory Visit: Payer: Self-pay | Admitting: Cardiovascular Disease

## 2014-11-06 NOTE — Telephone Encounter (Signed)
Azilect refill requested. Per last office note- patient to remain on medication. Refill approved and sent to patient's pharmacy.

## 2014-11-23 DIAGNOSIS — I4891 Unspecified atrial fibrillation: Secondary | ICD-10-CM | POA: Diagnosis not present

## 2014-11-23 DIAGNOSIS — Z45018 Encounter for adjustment and management of other part of cardiac pacemaker: Secondary | ICD-10-CM | POA: Diagnosis not present

## 2014-11-23 DIAGNOSIS — Z950 Presence of cardiac pacemaker: Secondary | ICD-10-CM | POA: Diagnosis not present

## 2014-11-23 DIAGNOSIS — I495 Sick sinus syndrome: Secondary | ICD-10-CM | POA: Diagnosis not present

## 2014-11-24 DIAGNOSIS — I484 Atypical atrial flutter: Secondary | ICD-10-CM | POA: Diagnosis not present

## 2014-11-24 DIAGNOSIS — I4891 Unspecified atrial fibrillation: Secondary | ICD-10-CM | POA: Diagnosis not present

## 2014-11-24 DIAGNOSIS — Z950 Presence of cardiac pacemaker: Secondary | ICD-10-CM | POA: Diagnosis not present

## 2014-11-24 DIAGNOSIS — I495 Sick sinus syndrome: Secondary | ICD-10-CM | POA: Diagnosis not present

## 2014-11-27 DIAGNOSIS — H401431 Capsular glaucoma with pseudoexfoliation of lens, bilateral, mild stage: Secondary | ICD-10-CM | POA: Diagnosis not present

## 2014-11-30 DIAGNOSIS — Z23 Encounter for immunization: Secondary | ICD-10-CM | POA: Diagnosis not present

## 2014-12-19 DIAGNOSIS — E785 Hyperlipidemia, unspecified: Secondary | ICD-10-CM | POA: Diagnosis not present

## 2014-12-19 DIAGNOSIS — I10 Essential (primary) hypertension: Secondary | ICD-10-CM | POA: Diagnosis not present

## 2014-12-19 DIAGNOSIS — E039 Hypothyroidism, unspecified: Secondary | ICD-10-CM | POA: Diagnosis not present

## 2014-12-19 DIAGNOSIS — R7301 Impaired fasting glucose: Secondary | ICD-10-CM | POA: Diagnosis not present

## 2014-12-19 DIAGNOSIS — M859 Disorder of bone density and structure, unspecified: Secondary | ICD-10-CM | POA: Diagnosis not present

## 2014-12-22 DIAGNOSIS — I4891 Unspecified atrial fibrillation: Secondary | ICD-10-CM | POA: Diagnosis not present

## 2014-12-22 DIAGNOSIS — R9431 Abnormal electrocardiogram [ECG] [EKG]: Secondary | ICD-10-CM | POA: Diagnosis not present

## 2014-12-27 DIAGNOSIS — Z1212 Encounter for screening for malignant neoplasm of rectum: Secondary | ICD-10-CM | POA: Diagnosis not present

## 2015-01-01 DIAGNOSIS — T148 Other injury of unspecified body region: Secondary | ICD-10-CM | POA: Diagnosis not present

## 2015-01-01 DIAGNOSIS — R7301 Impaired fasting glucose: Secondary | ICD-10-CM | POA: Diagnosis not present

## 2015-01-01 DIAGNOSIS — I509 Heart failure, unspecified: Secondary | ICD-10-CM | POA: Diagnosis not present

## 2015-01-01 DIAGNOSIS — G4700 Insomnia, unspecified: Secondary | ICD-10-CM | POA: Diagnosis not present

## 2015-01-01 DIAGNOSIS — Z1389 Encounter for screening for other disorder: Secondary | ICD-10-CM | POA: Diagnosis not present

## 2015-01-01 DIAGNOSIS — Z6828 Body mass index (BMI) 28.0-28.9, adult: Secondary | ICD-10-CM | POA: Diagnosis not present

## 2015-01-01 DIAGNOSIS — Z0000 Encounter for general adult medical examination without abnormal findings: Secondary | ICD-10-CM | POA: Diagnosis not present

## 2015-01-01 DIAGNOSIS — E039 Hypothyroidism, unspecified: Secondary | ICD-10-CM | POA: Diagnosis not present

## 2015-01-01 DIAGNOSIS — E781 Pure hyperglyceridemia: Secondary | ICD-10-CM | POA: Diagnosis not present

## 2015-01-01 DIAGNOSIS — D649 Anemia, unspecified: Secondary | ICD-10-CM | POA: Diagnosis not present

## 2015-01-01 DIAGNOSIS — K449 Diaphragmatic hernia without obstruction or gangrene: Secondary | ICD-10-CM | POA: Diagnosis not present

## 2015-01-01 DIAGNOSIS — R809 Proteinuria, unspecified: Secondary | ICD-10-CM | POA: Diagnosis not present

## 2015-01-09 ENCOUNTER — Ambulatory Visit (INDEPENDENT_AMBULATORY_CARE_PROVIDER_SITE_OTHER): Payer: Medicare Other | Admitting: Cardiovascular Disease

## 2015-01-09 ENCOUNTER — Encounter: Payer: Self-pay | Admitting: Cardiovascular Disease

## 2015-01-09 ENCOUNTER — Ambulatory Visit (INDEPENDENT_AMBULATORY_CARE_PROVIDER_SITE_OTHER): Payer: Medicare Other | Admitting: *Deleted

## 2015-01-09 ENCOUNTER — Encounter: Payer: Self-pay | Admitting: Internal Medicine

## 2015-01-09 VITALS — BP 118/86 | HR 104 | Ht 62.0 in | Wt 157.0 lb

## 2015-01-09 DIAGNOSIS — I10 Essential (primary) hypertension: Secondary | ICD-10-CM | POA: Diagnosis not present

## 2015-01-09 DIAGNOSIS — I482 Chronic atrial fibrillation, unspecified: Secondary | ICD-10-CM

## 2015-01-09 DIAGNOSIS — I495 Sick sinus syndrome: Secondary | ICD-10-CM | POA: Diagnosis not present

## 2015-01-09 DIAGNOSIS — I5032 Chronic diastolic (congestive) heart failure: Secondary | ICD-10-CM

## 2015-01-09 LAB — CUP PACEART INCLINIC DEVICE CHECK
Brady Statistic AP VP Percent: 8.76 %
Brady Statistic AP VS Percent: 73.14 %
Brady Statistic AS VP Percent: 6.06 %
Brady Statistic AS VS Percent: 12.04 %
Implantable Lead Implant Date: 20140917
Implantable Lead Implant Date: 20140917
Implantable Lead Location: 753859
Lead Channel Impedance Value: 361 Ohm
Lead Channel Impedance Value: 361 Ohm
Lead Channel Impedance Value: 475 Ohm
Lead Channel Pacing Threshold Amplitude: 1 V
Lead Channel Sensing Intrinsic Amplitude: 1 mV
Lead Channel Sensing Intrinsic Amplitude: 13.5 mV
Lead Channel Sensing Intrinsic Amplitude: 13.75 mV
Lead Channel Setting Pacing Amplitude: 2 V
Lead Channel Setting Pacing Amplitude: 2.5 V
Lead Channel Setting Pacing Pulse Width: 0.4 ms
MDC IDC LEAD LOCATION: 753860
MDC IDC MSMT BATTERY REMAINING LONGEVITY: 87 mo
MDC IDC MSMT BATTERY VOLTAGE: 3.01 V
MDC IDC MSMT LEADCHNL RA IMPEDANCE VALUE: 323 Ohm
MDC IDC MSMT LEADCHNL RA PACING THRESHOLD AMPLITUDE: 0.75 V
MDC IDC MSMT LEADCHNL RA PACING THRESHOLD PULSEWIDTH: 0.4 ms
MDC IDC MSMT LEADCHNL RA SENSING INTR AMPL: 0.75 mV
MDC IDC MSMT LEADCHNL RV PACING THRESHOLD PULSEWIDTH: 0.4 ms
MDC IDC SESS DTM: 20161206163137
MDC IDC SET LEADCHNL RV SENSING SENSITIVITY: 0.9 mV
MDC IDC STAT BRADY RA PERCENT PACED: 81.9 %
MDC IDC STAT BRADY RV PERCENT PACED: 14.82 %

## 2015-01-09 NOTE — Patient Instructions (Signed)
Medication Instructions:  Your physician recommends that you continue on your current medications as directed. Please refer to the Current Medication list given to you today.   Labwork: None Ordered   Testing/Procedures: None Ordered   Follow-Up: Your physician wants you to follow-up in: 1 year with Dr. Nahser.  You will receive a reminder letter in the mail two months in advance. If you don't receive a letter, please call our office to schedule the follow-up appointment.   If you need a refill on your cardiac medications before your next appointment, please call your pharmacy.   Thank you for choosing [facility]! Cove Haydon, RN [phone]    

## 2015-01-09 NOTE — Progress Notes (Signed)
[name] [name]  Date of Birth  [birth year]   [facility] [address]    [place], [place]  [address] [phone]  Fax  [phone]  Problems: 1. Atrial Fibrillation - s/p atrial fibrillation ablation 11/11/11,   2. Hypertension 3. Hyperlipidemia 4. Parkinson's  5. Pacer   History of Present Illness:  74 year old female with a history of atrial fibrillation. Her heart rate has been fairly well controlled on flecainide 100 mg twice a day . She has episodes of severe fatigue after she takes her medications.    Pt has had some chest pain with walking over the past several months. She had a normal stress Myoview study in May, 2010. She has had a few episodes of atrial ablation a typically occur when she's dehydrated. She is modestly better hydrated and this seems to make his episodes of atrial fibrillation.    She typically walks 50 minutes a day-4 times a week.  She's had 2 episodes of chest pressure while walking. The episode started with exercise and was relieved when she stops to rest.  She stopped her statin because of leg weakness.  She feels a bit better off the statin.  Dec. 19, 2013 She has had an atrial fibrillation ablation in October, 2013. She still is in atrial fibrillation. She is a little disappointed about this.  I told her that it was not uncommon to have some persistent atrial fibrillation for the first several months after an ablation. We will likely need to cardiovert her again. She was pleased to hear this.  She continues to have lots of problems related to her Parkinson's disease and the medicines used to treat Parkinson's. This seems to be her main limiting factor.  May 09, 2013:  And has a history of atrial fibrillation currently controlled on T-System therapy. She's had several prior AF ablations and also has had a convergence procedure at [facility]. Has a pacemaker. She has a history of Parkinson's disease. She's had  a cardiac catheterization in April 2014 and has normal coronary arteries.  She was admitted to the hospital in October with acute on chronic diastolic congestive heart failure she was diuresed and felt quite a bit better.   Dec. 6, 2016 She had an afib ablation with [facility] - was not successful. Eventually say Dr. Lehman Prom in [place] ablation + externally.   Now on amiodarone and metoprolol  Still weak and has significant DOE .   Not able to do her usual activities.     Current Outpatient Prescriptions on File Prior to Visit  Medication Sig Dispense Refill  . acetaminophen (TYLENOL) 500 MG tablet Take 1,000 mg by mouth every 4 (four) hours as needed for mild pain or headache.    . BD PEN NEEDLE NANO U/F 32G X 4 MM MISC 1 each daily.     [name] CALCIUM PO Take 1 tablet by mouth every morning.     . Calcium Polycarbophil (FIBERCON PO) Take 2 tablets by mouth every morning.     . clonazePAM (KLONOPIN) 0.5 MG tablet Take 1 tablet (0.5 mg total) by mouth at bedtime. 30 tablet 5  . Coenzyme Q10 (CO Q 10 PO) Take 1 tablet by mouth every morning.     Mariane Baumgarten Sodium (COLACE PO) Take 1 capsule by mouth 2 (two) times daily.     [name] ELIQUIS 5 MG TABS tablet 5 mg 2 (two) times daily.     [name]  ezetimibe (ZETIA) 10 MG tablet Take 10 mg by mouth every other day. Taking the same day as the crestor    . FORTEO 600 MCG/2.4ML SOLN Inject 20 mcg into the skin every morning.     . furosemide (LASIX) 20 MG tablet TAKE 1 TABLET (20 MG TOTAL) BY MOUTH DAILY. 30 tablet 11  . LUTEIN PO Take 1 tablet by mouth every morning.     [name] MAGNESIUM PO Take 1 tablet by mouth at bedtime.     . metoprolol succinate (TOPROL-XL) 25 MG 24 hr tablet Take 25 mg by mouth.    . potassium chloride (K-DUR,KLOR-CON) 10 MEQ tablet Take 10 mEq by mouth every morning.    . rosuvastatin (CRESTOR) 5 MG tablet Take 2.5 mg by mouth every other day.    . valACYclovir (VALTREX) 500 MG tablet Take 500 mg by mouth as needed (for fever  blisters).     . Vitamin D, Ergocalciferol, (DRISDOL) 50000 UNITS CAPS Take 10,000 Units by mouth daily.      No current facility-administered medications on file prior to visit.    Allergies  Allergen Reactions  . Statins     Leg weakness - but tolerating low dose Crestor every other day    Past Medical History  Diagnosis Date  . Hypertension   . Hyperlipidemia     a. Hx of leg weakness while on statin. under control  . Leg fracture Dec 21,2011  . Chronic anticoagulation     on Xarelto  . GERD (gastroesophageal reflux disease)   . Parkinson's disease ([place]) 2011  . Osteoporosis 07/2007  . PAF (paroxysmal atrial fibrillation) ([facility])     s/p atrial fib ablation 11-11-11.   . Wrist fracture     right  . Chronic diastolic CHF (congestive heart failure) ([facility])     Echocardiogram (11/24/12): Mild LVH, EF 60%.  . Heart murmur     slight  . Pacemaker   . Hypothyroidism   . Shortness of breath     "when walking at incline or stairs"  . Arthritis     thumbs  . Squamous carcinoma ([place]) summer 2015    back of left thigh  . Cataract     bilateral    Past Surgical History  Procedure Laterality Date  . Knee surgery Left 2001  . US echocardiography  03-10-2008    Est EF 55-60%, Dr. Cathie Olden  . Cardiovascular stress test  06-06-2008    EF 73%  . Wrist surgery Right ~2009    metal rod in place  . Cardioversion  09/09/2011    Procedure: CARDIOVERSION;  Surgeon: Darlin Coco, MD;  Location: [facility] ENDOSCOPY;  Service: Cardiovascular;  Laterality: N/A;  to be done by dr. Mare Ferrari  . Tee without cardioversion  11/10/2011    Procedure: TRANSESOPHAGEAL ECHOCARDIOGRAM (TEE);  Surgeon: Thayer Headings, MD;  Location: [facility];  Service: Cardiovascular;  Laterality: N/A;  . Cardioversion  02/18/2012    Procedure: CARDIOVERSION;  Surgeon: Thayer Headings, MD;  Location: [place];  Service: Cardiovascular;  Laterality: N/A;  . Cholecystectomy  1995  . Femur fracture surgery  2011    metal  pin in place  . 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 Cardiac electrophysiology study and ablation  5/14, 7/14  . Skin cancer destruction  summer 2015  . Breast surgery      benign cyst removed  . Forearm surgery Left 2010    "opened arm to drain it"  .  Tonsillectomy  as child  . Ventral hernia repair N/A 11/24/2013    Procedure: LAPAROSCOPIC VENTRAL HERNIA;  Surgeon: Michael Boston, MD;  [facility];  Service: General;  Laterality: N/A;  . Insertion of mesh N/A 11/24/2013    Procedure: INSERTION OF MESH;  Surgeon: Michael Boston, MD;  [facility];  Service: General;  Laterality: N/A;  . Atrial fibrillation ablation N/A 11/11/2011    Procedure: ATRIAL FIBRILLATION ABLATION;  Surgeon: Thompson Grayer, MD;  Location: [facility] CATH LAB;  Service: Cardiovascular;  Laterality: N/A;    History  Smoking status  . Never Smoker   Smokeless tobacco  . Never Used    History  Alcohol Use  . Yes    Comment: 1-2 glasses of wine daily    Family History  Problem Relation Age of Onset  . Alzheimer's disease Mother   . Heart failure Father     Reviw of Systems:  Reviewed in the HPI.  All other systems are negative.  Physical Exam: BP 118/86 mmHg  Pulse 104  Ht 5\' 2"  (1.575 m)  Wt 157 lb (71.215 kg)  BMI 28.71 kg/m2  LMP 02/04/1996 The patient is alert and oriented x 3.  The mood and affect are normal.   Skin: warm and dry.  Color is normal.    HEENT:   the sclera are nonicteric.  The mucous membranes are moist.  The carotids are 2+ without bruits.  There is no thyromegaly.  There is no JVD.    Lungs: clear.  The chest wall is non tender.    Heart: Irregularly irregular with a normal S1 and S2.  There are no murmurs, gallops, or rubs. The PMI is not displaced.     Abdomen: good bowel sounds.  There is no guarding or rebound.  There is no hepatosplenomegaly or tenderness.  There are no masses.   Extremities:  no clubbing, cyanosis, or edema.  The legs are without rashes.   The distal pulses are intact.   Neuro:  Cranial nerves II - XII are intact.  Motor and sensory functions are intact.    The gait is normal.  ECG:  V paced at 104.    Assessment / Plan:  1. Atrial Fibrillation - s/p atrial fibrillation ablation 11/11/11,  2. Atrial tachycardia - pacer interrogation today revealed an atrial tachycardia at around 140 beats a minute. The computer was reprogrammed such that mode switching  occurred a rate of 150 rather than 170. She converted back to a pace, V paced at rate of 60.   2. Hypertension - BP is well controlled.   3. Hyperlipidemia 4. Parkinson's  5. Pacer  - currently followed at [facility].   We discussed have her seen by Dr. Rayann Heman.       Nahser, Wonda Cheng, MD  01/09/2015 6:24 PM    [address] [place] [address],  [place], [place]  [address] Pager [phone] Phone: [phone]; Fax: [phone]   [facility]  [address] [place], [place]  [address] [phone]   Fax [phone]

## 2015-01-09 NOTE — Progress Notes (Signed)
Pacemaker check in clinic w/Dr. Acie Fredrickson for elevated HR. Normal device function. Thresholds, sensing, impedances consistent with previous measurements. Device programmed to maximize longevity. (1.5%) AF burden---max dur. 17 mins + Amio/Eliquis. AT episode from today terminated prior to interrogation. No high ventricular rates noted. Device programmed at appropriate safety margins. Histogram distribution appropriate for patient activity level. Device programmed to optimize intrinsic conduction. Estimated longevity 7 years. AT/AF detection rate decreased from 171bpm to 150bpm. Patient will continue f/u w/Dr.Mounsey as scheduled and f/u w/ JA prn.

## 2015-01-12 ENCOUNTER — Telehealth: Payer: Self-pay | Admitting: Nurse Practitioner

## 2015-01-12 NOTE — Telephone Encounter (Signed)
Sharyn Lull, Will you refer [name] back to see Dr. Rayann Heman  Left message for patient to call the office regarding Dr. Elmarie Shiley request to refer patient to Dr. Rayann Heman

## 2015-01-15 NOTE — Telephone Encounter (Signed)
Patient has an appointment with Dr. Rayann Heman on 2/1

## 2015-01-27 ENCOUNTER — Other Ambulatory Visit: Payer: Self-pay | Admitting: Cardiovascular Disease

## 2015-02-09 DIAGNOSIS — R0681 Apnea, not elsewhere classified: Secondary | ICD-10-CM | POA: Diagnosis not present

## 2015-02-09 DIAGNOSIS — I10 Essential (primary) hypertension: Secondary | ICD-10-CM | POA: Diagnosis not present

## 2015-02-09 DIAGNOSIS — I499 Cardiac arrhythmia, unspecified: Secondary | ICD-10-CM | POA: Diagnosis not present

## 2015-02-09 DIAGNOSIS — M810 Age-related osteoporosis without current pathological fracture: Secondary | ICD-10-CM | POA: Diagnosis not present

## 2015-02-09 DIAGNOSIS — E785 Hyperlipidemia, unspecified: Secondary | ICD-10-CM | POA: Diagnosis not present

## 2015-02-09 DIAGNOSIS — Z950 Presence of cardiac pacemaker: Secondary | ICD-10-CM | POA: Diagnosis not present

## 2015-02-09 DIAGNOSIS — K219 Gastro-esophageal reflux disease without esophagitis: Secondary | ICD-10-CM | POA: Diagnosis not present

## 2015-02-09 DIAGNOSIS — I495 Sick sinus syndrome: Secondary | ICD-10-CM | POA: Diagnosis not present

## 2015-02-09 DIAGNOSIS — G20 Parkinson's disease: Secondary | ICD-10-CM | POA: Diagnosis not present

## 2015-02-09 DIAGNOSIS — Z79899 Other long term (current) drug therapy: Secondary | ICD-10-CM | POA: Diagnosis not present

## 2015-02-09 DIAGNOSIS — E039 Hypothyroidism, unspecified: Secondary | ICD-10-CM | POA: Diagnosis not present

## 2015-02-09 DIAGNOSIS — I471 Supraventricular tachycardia: Secondary | ICD-10-CM | POA: Diagnosis not present

## 2015-02-14 ENCOUNTER — Encounter: Payer: Self-pay | Admitting: Neurology

## 2015-02-14 ENCOUNTER — Ambulatory Visit: Payer: Medicare Other | Admitting: Neurology

## 2015-02-14 ENCOUNTER — Ambulatory Visit (INDEPENDENT_AMBULATORY_CARE_PROVIDER_SITE_OTHER): Payer: Medicare Other | Admitting: Neurology

## 2015-02-14 VITALS — BP 110/80 | HR 86 | Ht 62.0 in | Wt 154.0 lb

## 2015-02-14 DIAGNOSIS — G20 Parkinson's disease: Secondary | ICD-10-CM | POA: Diagnosis not present

## 2015-02-14 DIAGNOSIS — G4700 Insomnia, unspecified: Secondary | ICD-10-CM

## 2015-02-14 NOTE — Progress Notes (Signed)
[name] [name] was seen today in the movement disorders clinic for neurologic consultation at the request of [name] A, MD.  The consultation is for the evaluation of PD.  [name] [name] has previously seen [name] and [name].  I have some of Dr. Bernardo [name] notes.  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  [name] [name] had [name] normal MRI brain at that time according to notes from [name] [name].  In 2011, her diagnosis was changed to Parkinson's disease and Azilect was added.  [name] [name] had [name] second opinion at [facility] on 07/25/2009 and [name] also thought that [name] [name] had Parkinson's disease.  In January, 2014 [name] added Artane, presumably for tremor.  [name] [name] does think that it has helped the tremor.  02/22/13 update:  The pt is f/u today re: PD.  After our discussion last visit, [name] [name] decided to stop the artane and states that while [name] [name] noted [name] little increase in tremor, it hasn't been bad.  I had wanted to start her on carbidopa/levodopa 25/100 but [name] [name] wanted to hold on that last vist.  Balance is good but [name] [name] has felt more slow and stiff.  No falls.  No hallucinations.  No n/v.  No near syncope.  Not exercising faithfully.  06/20/13 update:  The pt is f/u today re: PD.  [name] [name] was started on carbidopa/levodopa 25/100 last visit in January in addition to the azilect that [name] [name] was already on.  [name] [name] just returned from [name] trip from the [place] and was so surprised that [name] [name] could do all of the planned excursions.  [name] [name] felt great that [name] [name] could do all the hiking and could get in and out of the zodiac boat.  [name] [name] states that [name] [name] is so glad [name] [name] started taking the levodopa and thinks that her mood is better.  Her coordination and tremor are better but [name] [name] still has some tremor.  Some minor stomach upset.  Pharmacist told her to take it with food and [name] [name] admits that [name] [name] is taking it with protein.  [name] [name] last took levodopa at 7:30 am and was examined at 10am.  [name] [name] is having difficulty staying asleep.  10/13/13 update:  Pt is on carbidopa/levodopa 25/100  and is supposed to be on one tid but admits that [name] [name] is only taking 1/2 po bid. Did this because of GI upset.   [name] [name] is having more tremor especially when walking in the L hand; [name] [name] is not sure if it is any particular time of day.  It doesn't bother her too much.  Some increased difficulty buttoning clothing.  Has [name] URI and is leaving for [place] on Friday.  [name] [name] is going for 3 weeks.    02/13/14 update:  The patient returns today for follow-up.  [name] [name] reports that [name] [name] is not moving as fluidly as [name] [name] previously was.  Last time I told her that [name] [name] needed to really increase her medication to one tablet 3 times per day but [name] [name] did not do that and [name] [name] is only taking one twice [name] day; interestingly [name] [name] gets the first and the middle of the day dosage in and forgets the last.  [name] [name] does get nighttime (leg/toe) cramping.  [name] [name] did have [name] ventral hernia repair on 11/24/2013.  States that [name] [name] had [name] difficult time recovering as [name] [name] had [name] hematoma after.  Could not walk after surgery and has not been back to exercise but knows that [name] [name] needs to.  No falls.  Little tremor.    06/07/14 update:  The patient is f/u for PD.  [name] [name] is still on carbidopa/levodopa 25/100 twice per day.  [name] [name] did better clinically on tid dosing but backed down on it to bid on her own.   [name] [name] states that "sometimes" [name] [name] takes the third dose but [name] [name] often is now missing the middle of the day dose.  Sometimes, [name] [name] only takes it once [name] day.   [name] [name] is walking well; no falls.  [name] [name] is exercising but states that [name] [name] just had [name] cortisone injection into the left knee so that was [name] little limited.  [name] [name] is having an ablation for [name]-fib next tues.  [name] [name] states that it will be done at [facility] and it is [name] 3 hr procedure (because it is internal not external).  No lightheadedness.  [name] [name] is still on azilect faithfully.  [name] [name] takes klonopin about 1-2 times per week for insomnia.    10/11/14 update:  The patient follows up today for Parkinson's disease.  Last visit, I encouraged her  to go back up on her carbidopa/levodopa from twice per day to 3 times per day as [name] [name] was more stiff and rigid last visit.  [name] [name] may or may not do that but finds that most days [name] [name] takes it bid. [name] [name] has had some toe curling but is unsure if it is related to timing of meds.   [name] [name] remains on Azilect as well as clonazepam for her insomnia.  [name] [name] reports that [name] [name] sleeps with this but [name] [name] admits [name] [name] doesn't take it regularly.  After some discussion today, we realized that [name] [name] acidentally was taking temazepam instead of the klonopin.    [name] [name] reports that [name] [name] had another cardiac ablation since last visit and thought that it originally didn't work but now [name] [name] thinks that it is.    02/14/15 update:  The patient follows up today.  [name] [name] is supposed to be on carbidopa/levodopa 25/103 times [name] day, but [name] [name] generally takes it twice [name] day.  [name] [name] is on Azilect.  Since our last visit, [name] [name] has started amiodarone.  [name] [name] denies falls.  [name] [name] denies hallucinations.  [name] [name] denies lightheadedness or near syncope.  Her husband thinks that her voice isn't as good.  [name] [name] has had some SOB and wonders if it isn't from the amiodarone (but thinks that [name] [name] recently had some altitidue sickness as well skiing in the mountains); [name] [name] is f/u with Dr. Rayann Heman and is to have [name] CXR.  [name] [name] does think that SOB is getting better.  [name] [name] has not been able to exercise as much because of the SOB.  They have moved to the new villas at [facility] spring.  [name] [name] is having some trouble sleeping even with klonopin.    PREVIOUS MEDICATIONS: Azilect and artane  ALLERGIES:   Allergies  Allergen Reactions  . Statins     Leg weakness - but tolerating low dose Crestor every other day    CURRENT MEDICATIONS:  Current Outpatient Prescriptions on File Prior to Visit  Medication Sig Dispense Refill  . acetaminophen (TYLENOL) 500 MG tablet Take 1,000 mg by mouth every 4 (four) hours as needed for mild pain or headache.    [name] amiodarone (PACERONE) 200 MG tablet Take 200 mg by mouth  daily.  2  . BD PEN NEEDLE NANO U/F 32G X 4 MM MISC 1 each daily.     [name] CALCIUM PO Take 1 tablet by mouth every morning.     . Calcium Polycarbophil (FIBERCON PO) Take 2 tablets by mouth every morning.     [name]  carbidopa-levodopa (SINEMET IR) 25-100 MG tablet Take 1 tablet by mouth 3 (three) times daily.     . clonazePAM (KLONOPIN) 0.5 MG tablet Take 1 tablet (0.5 mg total) by mouth at bedtime. 30 tablet 5  . Coenzyme Q10 (CO Q 10 PO) Take 1 tablet by mouth every morning.     Mariane Baumgarten Sodium (COLACE PO) Take 1 capsule by mouth 2 (two) times daily.     [name] ELIQUIS 5 MG TABS tablet 5 mg 2 (two) times daily.     [name] ezetimibe (ZETIA) 10 MG tablet Take 10 mg by mouth every other day. Taking the same day as the crestor    . FORTEO 600 MCG/2.4ML SOLN Inject 20 mcg into the skin every morning.     . furosemide (LASIX) 20 MG tablet TAKE 1 TABLET (20 MG TOTAL) BY MOUTH DAILY. (Patient taking differently: TAKE 1.5 TABLET (20 MG TOTAL) BY MOUTH DAILY.) 30 tablet 11  . levothyroxine (SYNTHROID, LEVOTHROID) 75 MCG tablet Take 75 mcg by mouth daily.  11  . LUTEIN PO Take 1 tablet by mouth every morning.     [name] MAGNESIUM PO Take 1 tablet by mouth at bedtime.     . metoprolol succinate (TOPROL-XL) 25 MG 24 hr tablet Take 25 mg by mouth.    . potassium chloride (K-DUR,KLOR-CON) 10 MEQ tablet Take 10 mEq by mouth every morning.    . rosuvastatin (CRESTOR) 5 MG tablet Take 2.5 mg by mouth every other day.    . valACYclovir (VALTREX) 500 MG tablet Take 500 mg by mouth as needed (for fever blisters).      No current facility-administered medications on file prior to visit.    PAST MEDICAL HISTORY:   Past Medical History  Diagnosis Date  . Hypertension   . Hyperlipidemia     [name]. Hx of leg weakness while on statin. under control  . Leg fracture Dec 21,2011  . Chronic anticoagulation     on Xarelto  . GERD (gastroesophageal reflux disease)   . Parkinson's disease ([place]) 2011  . Osteoporosis 07/2007  . PAF  (paroxysmal atrial fibrillation) (HCC)     s/p atrial fib ablation 11-11-11.   . Wrist fracture     right  . Chronic diastolic CHF (congestive heart failure) (HCC)     Echocardiogram (11/24/12): Mild LVH, EF 60%.  . Heart murmur     slight  . Pacemaker   . Hypothyroidism   . Shortness of breath     "when walking at incline or stairs"  . Arthritis     thumbs  . Squamous carcinoma ([place]) summer 2015    back of left thigh  . Cataract     bilateral    PAST SURGICAL HISTORY:   Past Surgical History  Procedure Laterality Date  . Knee surgery Left 2001  . US echocardiography  03-10-2008    Est EF 55-60%, Dr. Cathie Olden  . Cardiovascular stress test  06-06-2008    EF 73%  . Wrist surgery Right ~2009    metal rod in place  . Cardioversion  09/09/2011    Procedure: CARDIOVERSION;  Surgeon: Darlin Coco, MD;  Location[facility] ENDOSCOPY;  Service: Cardiovascular;  Laterality: N/[name];  to be done by dr. Mare Ferrari  . Tee without cardioversion  11/10/2011    Procedure: TRANSESOPHAGEAL ECHOCARDIOGRAM (TEE);  Surgeon: Thayer Headings, MD;  Location: [place];  Service: Cardiovascular;  Laterality: N/[name];  . Cardioversion  02/18/2012    Procedure: CARDIOVERSION;  Surgeon: Wonda Cheng  Nahser, MD;  Location: [place];  Service: Cardiovascular;  Laterality: N/[name];  . Cholecystectomy  1995  . Femur fracture surgery  2011    metal pin in place  . 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 Cardiac electrophysiology study and ablation  5/14, 7/14  . Skin cancer destruction  summer 2015  . Breast surgery      benign cyst removed  . Forearm surgery Left 2010    "opened arm to drain it"  . Tonsillectomy  as child  . Ventral hernia repair N/[name] 11/24/2013    Procedure: LAPAROSCOPIC VENTRAL HERNIA;  Surgeon: Michael Boston, MD;  Location: [facility] ORS;  Service: General;  Laterality: N/[name];  . Insertion of mesh N/[name] 11/24/2013    Procedure: INSERTION OF MESH;  Surgeon: Michael Boston, MD;  Location: [facility] ORS;  Service:  General;  Laterality: N/[name];  . Atrial fibrillation ablation N/[name] 11/11/2011    Procedure: ATRIAL FIBRILLATION ABLATION;  Surgeon: Thompson Grayer, MD;  Location: [facility] CATH LAB;  Service: Cardiovascular;  Laterality: N/[name];    SOCIAL HISTORY:   Social History   Social History  . Marital Status: Married    Spouse Name: N/[name]  . Number of Children: N/[name]  . Years of Education: N/[name]   Occupational History  . Not on file.   Social History Main Topics  . Smoking status: Never Smoker   . Smokeless tobacco: Never Used  . Alcohol Use: Yes     Comment: 1-2 glasses of wine daily  . Drug Use: No  . Sexual Activity:    Partners: Male    Birth Control/ Protection: Post-menopausal   Other Topics Concern  . Not on file   Social History Narrative   Lives in [place].  Retired Licensed conveyancer.    FAMILY HISTORY:   Family Status  Relation Status Death Age  . Mother Deceased 72    AD   . Father Deceased 56    Valve-rheumatic fever as child  . Child Alive     healthy  . Child Alive     healthy    ROS:  [name] complete 10 system review of systems was obtained and was unremarkable apart from what is mentioned above.  PHYSICAL EXAMINATION:    VITALS:   Filed Vitals:   02/14/15 0928  BP: 110/80  Pulse: 86  Height: 5\' 2"  (1.575 m)  Weight: 154 lb (69.854 kg)    GEN:  The patient appears stated age and is in NAD. HEENT:  Normocephalic, atraumatic.  The mucous membranes are moist. The superficial temporal arteries are without ropiness or tenderness. CV:  irreg irreg Lungs:  CTAB  Neurological examination:  Orientation: The patient is alert and oriented x3. Fund of knowledge is appropriate.     Movement examination: Tone: There is mild increased tone in the RUE Abnormal movements: There is [name] mild, intermittent resting tremor of the bilateral UE.   There is intermittent tremor of the RLE.   When [name] [name] ambulates, [name] [name] has [name] tremor of the bilateral upper extremity.  Tremor increases with distraction  procedures.   Coordination:  There is no decremation with RAMs today. Gait and Station: The patient has no difficulty arising out of [name] deep-seated chair without the use of the hands. The patient's stride length is mildly decreased.  Tremor increases with ambulation.  The patient has [name] negative pull test.      ASSESSMENT/PLAN:  1.  idiopathic tremor predominant Parkinson's disease  -Encouraged her again to take  carbidopa/levodopa 25/100, one tablet 3 times per day.  [name] [name] is often taking it twice per day and taking the last one at bedtime (so essentially is only being able to use one per day in the AM as sleeps the other off).    Encouraged her to try and get all 3 dosages in.  Have talked about this at last several visits but is slightly more rigid today and does have (and has had) evident tremor.  -[name] [name] will remain on the Azilect.  -encouraged exercise.  Has not been engaging in this lately  -Talk to the patient about the addition of amiodarone for her [name]-fib.  [name] [name] obviously needs this for her cardiac status, but I did tell her that this could cause tremor and in rare cases can cause parkinsonism (thereby worsening her Parkinson's disease). 2.  Insomnia.  -[name] [name] is on klonopin.  Will add melatonin, 3 mg. 3.  Follow up in the next few months, sooner should new neurologic issues arise.   Much greater than 50% of this visit was spent in counseling with the patient and the family.  Total face to face time:  25 min

## 2015-02-14 NOTE — Patient Instructions (Signed)
1.  Start melatonin - 3 mg nightly

## 2015-02-19 ENCOUNTER — Telehealth: Payer: Self-pay | Admitting: Internal Medicine

## 2015-02-19 DIAGNOSIS — I10 Essential (primary) hypertension: Secondary | ICD-10-CM | POA: Diagnosis not present

## 2015-02-19 DIAGNOSIS — I509 Heart failure, unspecified: Secondary | ICD-10-CM | POA: Diagnosis not present

## 2015-02-19 DIAGNOSIS — E038 Other specified hypothyroidism: Secondary | ICD-10-CM | POA: Diagnosis not present

## 2015-02-19 NOTE — Telephone Encounter (Signed)
New Message  Pt calling to see if Dr Rayann Heman has received/reviewed test results from [facility]. Please call back and discuss.

## 2015-02-19 NOTE — Telephone Encounter (Signed)
I called and spoke with patient.  She has been following with Dr Lehman Prom at [facility].  He started her on Amiodarone and she has started hfaving some problems.  She says he was going to send note or call [facility] to discuss.  She is wanting to have the test done her that he is requesting.  She thinks it's a CXR, PFT's, and a stress test.  I have asked her to have him fax the order to me at [phone].  She is scheduled to see Dr Rayann Heman on 03/07/15 and needs these done prior.  She will have them fax over the orders

## 2015-02-22 NOTE — Telephone Encounter (Signed)
She spoke with the patient and she spoke with [facility] on Wed and they are to be sending the order.  I let her know as of today I still do not have.  I told her if I do not have by Mon at lunch I will let her know.  She was very appreciative of my call.

## 2015-02-26 NOTE — Telephone Encounter (Signed)
Left message for the patient that I had discussed with Dr Rayann Heman and he wants to see her on 03/07/15 before he orders any test.

## 2015-03-07 ENCOUNTER — Ambulatory Visit (INDEPENDENT_AMBULATORY_CARE_PROVIDER_SITE_OTHER): Payer: Medicare Other | Admitting: Internal Medicine

## 2015-03-07 ENCOUNTER — Encounter: Payer: Self-pay | Admitting: Internal Medicine

## 2015-03-07 VITALS — BP 100/70 | HR 92 | Ht 62.0 in | Wt 152.0 lb

## 2015-03-07 DIAGNOSIS — Q268 Other congenital malformations of great veins: Secondary | ICD-10-CM | POA: Diagnosis not present

## 2015-03-07 DIAGNOSIS — I482 Chronic atrial fibrillation, unspecified: Secondary | ICD-10-CM

## 2015-03-07 DIAGNOSIS — I495 Sick sinus syndrome: Secondary | ICD-10-CM

## 2015-03-07 DIAGNOSIS — R0602 Shortness of breath: Secondary | ICD-10-CM

## 2015-03-07 LAB — CUP PACEART INCLINIC DEVICE CHECK
Battery Remaining Longevity: 79 mo
Battery Voltage: 3.01 V
Brady Statistic AP VP Percent: 10.32 %
Brady Statistic AS VS Percent: 12.22 %
Implantable Lead Implant Date: 20140917
Implantable Lead Implant Date: 20140917
Implantable Lead Location: 753859
Implantable Lead Location: 753860
Lead Channel Impedance Value: 342 Ohm
Lead Channel Impedance Value: 361 Ohm
Lead Channel Impedance Value: 456 Ohm
Lead Channel Pacing Threshold Pulse Width: 0.4 ms
Lead Channel Sensing Intrinsic Amplitude: 0.625 mV
Lead Channel Setting Pacing Amplitude: 2.5 V
Lead Channel Setting Pacing Pulse Width: 0.4 ms
MDC IDC MSMT LEADCHNL RA PACING THRESHOLD AMPLITUDE: 1 V
MDC IDC MSMT LEADCHNL RV IMPEDANCE VALUE: 361 Ohm
MDC IDC MSMT LEADCHNL RV PACING THRESHOLD AMPLITUDE: 1 V
MDC IDC MSMT LEADCHNL RV PACING THRESHOLD PULSEWIDTH: 0.4 ms
MDC IDC MSMT LEADCHNL RV SENSING INTR AMPL: 17 mV
MDC IDC SESS DTM: 20170201130226
MDC IDC SET LEADCHNL RA PACING AMPLITUDE: 2 V
MDC IDC SET LEADCHNL RV SENSING SENSITIVITY: 0.9 mV
MDC IDC STAT BRADY AP VS PERCENT: 58.24 %
MDC IDC STAT BRADY AS VP PERCENT: 19.22 %
MDC IDC STAT BRADY RA PERCENT PACED: 68.56 %
MDC IDC STAT BRADY RV PERCENT PACED: 29.54 %

## 2015-03-07 NOTE — Progress Notes (Signed)
Electrophysiology Office Note   Date:  03/07/2015   ID:  [name] [name], DOB March 28, 1940, MRN [id number]  PCP:  [name] Ly, MD  Cardiologist:  Dr [name] Primary Electrophysiologist: [name] Grayer, MD    Chief Complaint  Patient presents with  . Atrial Fibrillation     History of Present Illness: [name] [name] is a 75 y.o. female who presents today for electrophysiology evaluation.   The patient has struggled with atrial arrhythmias for a long time.  She underwent atrial fibrillation by me several years ago.  She subsequently was referred and had Convergent procedure at [name].  She reports that this was complicated by hernia which required surgical repair.  She continued to have atrial arrhythmias despite tikosyn after ablation.  She underwent ablation for atypical atrial fibrillation/ atrial flutter.  Due to ongoing arrhythmias, more recently, she has been placed on amiodarone.  She thinks that she has been on amiodarone since around September.  She reports shortness of breath with walking more than 50 feet.  She is also fatigued.  She feels that this represents a close decline.   Today, she denies symptoms of palpitations, chest pain, orthopnea, PND, lower extremity edema, claudication, dizziness, presyncope, syncope, bleeding, or neurologic sequela. The patient is tolerating medications without difficulties and is otherwise without complaint today.    Past Medical History  Diagnosis Date  . Hypertension   . Hyperlipidemia     a. Hx of leg weakness while on statin. under control  . Leg fracture Dec 21,2011  . Chronic anticoagulation     on Xarelto  . GERD (gastroesophageal reflux disease)   . Parkinson's disease ([name]) 2011  . Osteoporosis 07/2007  . Persistent atrial fibrillation (HCC)     s/p atrial fib ablation 11-11-11.   . Wrist fracture     right  . Chronic diastolic CHF (congestive heart failure) (HCC)     Echocardiogram (11/24/12): Mild LVH, EF 60%.  . Heart murmur    slight  . Sinus brady-tachy syndrome ([name] [name])   . Hypothyroidism   . Shortness of breath     "when walking at incline or stairs"  . Arthritis     thumbs  . Squamous carcinoma ([name] [name]) summer 2015    back of left thigh  . Cataract     bilateral  . Atypical atrial flutter ([name])   . Atrial tachycardia [facility])    Past Surgical History  Procedure Laterality Date  . Knee [name] Left 2001  . US echocardiography  03-10-2008    Est EF 55-60%, Dr. Cathie Olden  . Cardiovascular stress test  06-06-2008    EF 73%  . Wrist [name] Right ~2009    metal rod in place  . Cardioversion  09/09/2011    Procedure: CARDIOVERSION;  Surgeon: Darlin Coco, MD;  Location: [facility] ENDOSCOPY;  Service: Cardiovascular;  Laterality: N/A;  to be done by dr. Mare Ferrari  . Tee without cardioversion  11/10/2011    Procedure: TRANSESOPHAGEAL ECHOCARDIOGRAM (TEE);  Surgeon: Thayer Headings, MD;  Location: [place];  Service: Cardiovascular;  Laterality: N/A;  . Cardioversion  02/18/2012    Procedure: CARDIOVERSION;  Surgeon: Thayer Headings, MD;  Location: [place];  Service: Cardiovascular;  Laterality: N/A;  . Cholecystectomy  1995  . Femur fracture [name]  2011    metal pin in place  . Pacemaker placement  10/20/12    MDT Advisa DR implanted by Dr Lehman Prom at [name]   . Cardiac electrophysiology study and ablation  5/14, 7/14  convergent AF ablation at [name] and subsequent atypical atrial flutter ablation by Dr Lehman Prom  . Skin cancer destruction  summer 2015  . Breast [name]      benign cyst removed  . Forearm [name] Left 2010    "opened arm to drain it"  . Tonsillectomy  as child  . Ventral hernia repair N/A 11/24/2013    Procedure: LAPAROSCOPIC VENTRAL HERNIA;  Surgeon: Michael Boston, MD;  Location: [name];  Service: General;  Laterality: N/A;  . Insertion of mesh N/A 11/24/2013    Procedure: INSERTION OF MESH;  Surgeon: Michael Boston, MD;  Location: [name];  Service: General;  Laterality: N/A;  . Atrial fibrillation  ablation N/A 11/11/2011    Procedure: ATRIAL FIBRILLATION ABLATION;  Surgeon: [name] Grayer, MD;  Location: [name];  Service: Cardiovascular;  Laterality: N/A;     Current Outpatient Prescriptions  Medication Sig Dispense Refill  . acetaminophen (TYLENOL) 500 MG tablet Take 1,000 mg by mouth every 4 (four) hours as needed for mild pain or headache.    [name] amiodarone (PACERONE) 200 MG tablet Take 200 mg by mouth daily.  2  . AZILECT 1 MG TABS tablet TAKE 1 TABLET (1 MG TOTAL) BY MOUTH DAILY.  3  . CALCIUM PO Take 1 tablet by mouth every morning.     . Calcium Polycarbophil (FIBERCON PO) Take 2 tablets by mouth every morning.     . carbidopa-levodopa (SINEMET IR) 25-100 MG tablet Take 1 tablet by mouth 3 (three) times daily.     . clonazePAM (KLONOPIN) 0.5 MG tablet Take 1 tablet (0.5 mg total) by mouth at bedtime. 30 tablet 5  . Coenzyme Q10 (CO Q 10 PO) Take 1 tablet by mouth every morning.     Mariane Baumgarten Sodium (COLACE PO) Take 1 capsule by mouth 2 (two) times daily.     [name] ELIQUIS 5 MG TABS tablet Take 5 mg by mouth 2 (two) times daily.     [name] ezetimibe (ZETIA) 10 MG tablet Take 10 mg by mouth every other day. Taking the same day as the crestor    . FORTEO 600 MCG/2.4ML SOLN Inject 20 mcg into the skin every morning.     . furosemide (LASIX) 20 MG tablet 20 mg. Take 1 1/2 tablets by mouth daily    . levothyroxine (SYNTHROID, LEVOTHROID) 75 MCG tablet Take 75 mcg by mouth daily.  11  . LUTEIN PO Take 1 tablet by mouth every morning.     [name] MAGNESIUM PO Take 1 tablet by mouth at bedtime.     . metoprolol succinate (TOPROL-XL) 25 MG 24 hr tablet Take 25 mg by mouth.    . potassium chloride (K-DUR,KLOR-CON) 10 MEQ tablet Take 10 mEq by mouth every morning.    . rosuvastatin (CRESTOR) 5 MG tablet Take 2.5 mg by mouth every other day.    . valACYclovir (VALTREX) 500 MG tablet Take 500 mg by mouth as needed (for fever blisters).     . BD PEN NEEDLE NANO U/F 32G X 4 MM MISC 1 each daily.     [name]  VITAMIN D, ERGOCALCIFEROL, PO Take 10,000 Units by mouth daily.     No current facility-administered medications for this visit.    Allergies:   Statins   Social History:  The patient  reports that she has never smoked. She has never used smokeless tobacco. She reports that she drinks alcohol. She reports that she does not use illicit drugs.   Family History:  The patient's family history includes Alzheimer's disease in her mother; Heart failure in her father.    ROS:  Please see the history of present illness.   All other systems are reviewed and negative.    PHYSICAL EXAM: VS:  BP 100/70 mmHg  Pulse 92  Ht 5\' 2"  (1.575 m)  Wt 152 lb (68.947 kg)  BMI 27.79 kg/m2  LMP 02/04/1996 , BMI Body mass index is 27.79 kg/(m^2). GEN: Well nourished, well developed, in no acute distress HEENT: normal Neck: no JVD, carotid bruits, or masses Cardiac: tachycardic regular rhythm; no murmurs, rubs, or gallops,no edema  Respiratory:  clear to auscultation bilaterally, normal work of breathing GI: soft, nontender, nondistended, + BS MS: no deformity or atrophy Skin: warm and dry, device pocket is well healed Neuro:  Strength and sensation are intact Psych: euthymic mood, full affect  EKG:  EKG is ordered today. The ekg ordered today shows v paced rhythm  Device interrogation is reviewed today in detail.  See PaceArt for details.   Recent Labs: No results found for requested labs within last 365 days.    Lipid Panel     Component Value Date/Time   CHOL  05/21/2007 0540    171        ATP III CLASSIFICATION:  <200     mg/dL   Desirable  200-239  mg/dL   Borderline High  >=240    mg/dL   High   TRIG 51 05/21/2007 0540   HDL 59 05/21/2007 0540   CHOLHDL 2.9 05/21/2007 0540   VLDL 10 05/21/2007 0540   LDLCALC * 05/21/2007 0540    102        Total Cholesterol/HDL:CHD Risk Coronary Heart Disease Risk Table                     Men   Women  1/2 Average Risk   3.4   3.3     Wt  Readings from Last 3 Encounters:  03/07/15 152 lb (68.947 kg)  02/14/15 154 lb (69.854 kg)  01/09/15 157 lb (71.215 kg)      Other studies Reviewed: Additional studies/ records that were reviewed today include: 20 pages of records from [facility], I have also discussed patient with Dr Joylene Draft today    ASSESSMENT AND PLAN:  1.  Shortness of breath Unclear etiology She has been referred by Dr Lehman Prom for further evaluation. I will obtain CXR (I have spoke with Dr Joylene Draft and she will have this at his office).  I will also order PFTs. Echo is ordered to evaluate for pulmonary hypertension/ valvular/ ventricular issues. Cardiac CT to evaluate for pulmonary vein stenosis and to further evaluate Cors.  Consider CPX testing for further functional evaluation pending results of above.  2. Atrial arrhythmias A paced Appears to have frequent atrial tachycardia (In this rhythm today)  AF burden is 2.5% Normal device function though histogram does show bump at around 100 bpm (atach) A paced 58%, V paced 29% Continue current dose of amiodarone for now. chads2vasc score is 3.  Continue anticoagulation  3. Tachycardia/ bradycardia syndrome Normal pacemaker function See Pace Art report No changes today  4. HTN Stable No change required today  Very complex situation.  The patient is at risk of decompensation.  Multiple databases were reviewed.  I have also discussed the patient with both Drs Lucilla [facility]) and also Dr Joylene Draft.  A high level of decision making was required for this encounter.  Labs/ tests  ordered today include:  Orders Placed This Encounter  Procedures  . CT Heart Morp W/Cta Cor W/Score W/Ca W/Cm &/Or Wo/Cm  . Implantable device check  . EKG 12-Lead  . ECHOCARDIOGRAM COMPLETE  . Pulmonary function test     Signed, [name] Grayer, MD  03/07/2015 2:25 PM     [place] [address] [phone] (office) [phone] (fax)

## 2015-03-07 NOTE — Patient Instructions (Addendum)
Medication Instructions:  Your physician recommends that you continue on your current medications as directed. Please refer to the Current Medication list given to you today.  Labwork: None  ordered  Testing/Procedures: Your physician has requested that you have an echocardiogram. Echocardiography is a painless test that uses sound waves to create images of your heart. It provides your doctor with information about the size and shape of your heart and how well your heart's chambers and valves are working. This procedure takes approximately one hour. There are no restrictions for this procedure.  Your physician has recommended that you have a pulmonary function test. Pulmonary Function Tests are a group of tests that measure how well air moves in and out of your lungs.  Your physician has requested that you have cardiac CT. Cardiac computed tomography (CT) is a painless test that uses an x-ray machine to take clear, detailed pictures of your heart. For further information please visit [email or web address]. Please follow instruction sheet as given.  Follow-Up: Your physician recommends that you schedule a follow-up appointment in: 4 weeks with Dr. Rayann Heman.  If you need a refill on your cardiac medications before your next appointment, please call your pharmacy.  You will have a chest xray at Dr. Silvestre Mesi office.   Dr. Rayann Heman has spoken to him about this.  Thank you for choosing [facility]!!

## 2015-03-08 DIAGNOSIS — Z6827 Body mass index (BMI) 27.0-27.9, adult: Secondary | ICD-10-CM | POA: Diagnosis not present

## 2015-03-08 DIAGNOSIS — R0602 Shortness of breath: Secondary | ICD-10-CM | POA: Diagnosis not present

## 2015-03-12 ENCOUNTER — Other Ambulatory Visit: Payer: Self-pay

## 2015-03-12 DIAGNOSIS — Z1231 Encounter for screening mammogram for malignant neoplasm of breast: Secondary | ICD-10-CM

## 2015-03-14 ENCOUNTER — Other Ambulatory Visit: Payer: Self-pay | Admitting: Neurology

## 2015-03-14 ENCOUNTER — Other Ambulatory Visit: Payer: Self-pay

## 2015-03-14 MED ORDER — CARBIDOPA-LEVODOPA 25-100 MG PO TABS: 1.0000 | ORAL_TABLET | Freq: Three times a day (TID) | ORAL | Status: DC

## 2015-03-14 NOTE — Telephone Encounter (Signed)
Carbidopa Levodopa refill requested. Per last office note- patient to remain on medication. Refill approved and sent to patient's pharmacy.   

## 2015-03-16 ENCOUNTER — Encounter: Payer: Self-pay | Admitting: Internal Medicine

## 2015-03-19 ENCOUNTER — Other Ambulatory Visit (HOSPITAL_COMMUNITY): Payer: Self-pay

## 2015-03-20 ENCOUNTER — Encounter: Payer: Self-pay | Admitting: Internal Medicine

## 2015-03-20 ENCOUNTER — Other Ambulatory Visit: Payer: Self-pay

## 2015-03-20 ENCOUNTER — Ambulatory Visit (HOSPITAL_COMMUNITY): Payer: Medicare Other | Attending: Cardiology

## 2015-03-20 DIAGNOSIS — I517 Cardiomegaly: Secondary | ICD-10-CM | POA: Diagnosis not present

## 2015-03-20 DIAGNOSIS — E785 Hyperlipidemia, unspecified: Secondary | ICD-10-CM | POA: Diagnosis not present

## 2015-03-20 DIAGNOSIS — I071 Rheumatic tricuspid insufficiency: Secondary | ICD-10-CM | POA: Insufficient documentation

## 2015-03-20 DIAGNOSIS — R0600 Dyspnea, unspecified: Secondary | ICD-10-CM | POA: Diagnosis present

## 2015-03-20 DIAGNOSIS — I351 Nonrheumatic aortic (valve) insufficiency: Secondary | ICD-10-CM | POA: Insufficient documentation

## 2015-03-20 DIAGNOSIS — I10 Essential (primary) hypertension: Secondary | ICD-10-CM | POA: Insufficient documentation

## 2015-03-20 DIAGNOSIS — R0602 Shortness of breath: Secondary | ICD-10-CM | POA: Insufficient documentation

## 2015-03-20 DIAGNOSIS — I340 Nonrheumatic mitral (valve) insufficiency: Secondary | ICD-10-CM | POA: Diagnosis not present

## 2015-03-27 DIAGNOSIS — R945 Abnormal results of liver function studies: Secondary | ICD-10-CM | POA: Diagnosis not present

## 2015-03-28 ENCOUNTER — Other Ambulatory Visit: Payer: Self-pay | Admitting: Internal Medicine

## 2015-03-28 DIAGNOSIS — R945 Abnormal results of liver function studies: Secondary | ICD-10-CM

## 2015-03-29 ENCOUNTER — Ambulatory Visit
Admission: RE | Admit: 2015-03-29 | Discharge: 2015-03-29 | Disposition: A | Discharge status: Home / Self Care | Payer: Medicare Other | Source: Ambulatory Visit

## 2015-03-29 ENCOUNTER — Encounter (HOSPITAL_COMMUNITY): Payer: Self-pay

## 2015-03-29 ENCOUNTER — Ambulatory Visit (HOSPITAL_COMMUNITY)
Admission: RE | Admit: 2015-03-29 | Discharge: 2015-03-29 | Disposition: A | Discharge status: Home / Self Care | Payer: Medicare Other | Source: Ambulatory Visit | Attending: Internal Medicine | Admitting: Internal Medicine

## 2015-03-29 DIAGNOSIS — Z79899 Other long term (current) drug therapy: Secondary | ICD-10-CM | POA: Diagnosis not present

## 2015-03-29 DIAGNOSIS — I482 Chronic atrial fibrillation: Secondary | ICD-10-CM | POA: Insufficient documentation

## 2015-03-29 DIAGNOSIS — E785 Hyperlipidemia, unspecified: Secondary | ICD-10-CM | POA: Diagnosis not present

## 2015-03-29 DIAGNOSIS — I4891 Unspecified atrial fibrillation: Secondary | ICD-10-CM | POA: Diagnosis not present

## 2015-03-29 DIAGNOSIS — I110 Hypertensive heart disease with heart failure: Secondary | ICD-10-CM | POA: Insufficient documentation

## 2015-03-29 DIAGNOSIS — I5032 Chronic diastolic (congestive) heart failure: Secondary | ICD-10-CM | POA: Diagnosis not present

## 2015-03-29 DIAGNOSIS — Z1231 Encounter for screening mammogram for malignant neoplasm of breast: Secondary | ICD-10-CM

## 2015-03-29 DIAGNOSIS — R918 Other nonspecific abnormal finding of lung field: Secondary | ICD-10-CM | POA: Diagnosis not present

## 2015-03-29 DIAGNOSIS — Z7901 Long term (current) use of anticoagulants: Secondary | ICD-10-CM | POA: Insufficient documentation

## 2015-03-29 DIAGNOSIS — Q268 Other congenital malformations of great veins: Secondary | ICD-10-CM | POA: Diagnosis not present

## 2015-03-29 MED ORDER — IOHEXOL 350 MG/ML SOLN
80.0000 mL | Freq: Once | INTRAVENOUS | Status: AC | PRN
  Administered 2015-03-29: 80 mL via INTRAVENOUS

## 2015-04-03 ENCOUNTER — Encounter: Payer: Medicare Other | Admitting: Internal Medicine

## 2015-04-06 ENCOUNTER — Encounter: Payer: Self-pay | Admitting: Internal Medicine

## 2015-04-06 ENCOUNTER — Ambulatory Visit
Admission: RE | Admit: 2015-04-06 | Discharge: 2015-04-06 | Disposition: A | Discharge status: Home / Self Care | Payer: Medicare Other | Source: Ambulatory Visit | Attending: Internal Medicine | Admitting: Internal Medicine

## 2015-04-06 DIAGNOSIS — R945 Abnormal results of liver function studies: Secondary | ICD-10-CM

## 2015-04-06 DIAGNOSIS — R7989 Other specified abnormal findings of blood chemistry: Secondary | ICD-10-CM | POA: Diagnosis not present

## 2015-04-09 DIAGNOSIS — D225 Melanocytic nevi of trunk: Secondary | ICD-10-CM | POA: Diagnosis not present

## 2015-04-09 DIAGNOSIS — L570 Actinic keratosis: Secondary | ICD-10-CM | POA: Diagnosis not present

## 2015-04-09 DIAGNOSIS — D1801 Hemangioma of skin and subcutaneous tissue: Secondary | ICD-10-CM | POA: Diagnosis not present

## 2015-04-09 DIAGNOSIS — Z85828 Personal history of other malignant neoplasm of skin: Secondary | ICD-10-CM | POA: Diagnosis not present

## 2015-04-09 DIAGNOSIS — L820 Inflamed seborrheic keratosis: Secondary | ICD-10-CM | POA: Diagnosis not present

## 2015-04-09 DIAGNOSIS — D2262 Melanocytic nevi of left upper limb, including shoulder: Secondary | ICD-10-CM | POA: Diagnosis not present

## 2015-04-09 DIAGNOSIS — L821 Other seborrheic keratosis: Secondary | ICD-10-CM | POA: Diagnosis not present

## 2015-04-09 DIAGNOSIS — D485 Neoplasm of uncertain behavior of skin: Secondary | ICD-10-CM | POA: Diagnosis not present

## 2015-04-09 DIAGNOSIS — L859 Epidermal thickening, unspecified: Secondary | ICD-10-CM | POA: Diagnosis not present

## 2015-04-10 ENCOUNTER — Ambulatory Visit (INDEPENDENT_AMBULATORY_CARE_PROVIDER_SITE_OTHER): Payer: Medicare Other | Admitting: Internal Medicine

## 2015-04-10 DIAGNOSIS — R0602 Shortness of breath: Secondary | ICD-10-CM

## 2015-04-10 LAB — PULMONARY FUNCTION TEST
DL/VA % pred: 113 %
DL/VA: 5.07 ml/min/mmHg/L
DLCO COR % PRED: 69 %
DLCO COR: 14.49 ml/min/mmHg
DLCO UNC: 13.88 ml/min/mmHg
DLCO unc % pred: 66 %
FEF 25-75 POST: 1.95 L/s
FEF 25-75 Pre: 1.75 L/sec
FEF2575-%Change-Post: 11 %
FEF2575-%PRED-PRE: 107 %
FEF2575-%Pred-Post: 119 %
FEV1-%Change-Post: 0 %
FEV1-%PRED-POST: 87 %
FEV1-%Pred-Pre: 86 %
FEV1-POST: 1.7 L
FEV1-Pre: 1.69 L
FEV1FVC-%CHANGE-POST: 2 %
FEV1FVC-%PRED-PRE: 111 %
FEV6-%Change-Post: -2 %
FEV6-%Pred-Post: 79 %
FEV6-%Pred-Pre: 81 %
FEV6-Post: 1.96 L
FEV6-Pre: 2.01 L
FEV6FVC-%PRED-POST: 105 %
FEV6FVC-%Pred-Pre: 105 %
FVC-%Change-Post: -1 %
FVC-%PRED-PRE: 77 %
FVC-%Pred-Post: 76 %
FVC-PRE: 2.01 L
FVC-Post: 1.98 L
POST FEV1/FVC RATIO: 86 %
POST FEV6/FVC RATIO: 100 %
PRE FEV1/FVC RATIO: 84 %
Pre FEV6/FVC Ratio: 100 %
RV % pred: 89 %
RV: 1.89 L
TLC % PRED: 80 %
TLC: 3.75 L

## 2015-04-10 NOTE — Progress Notes (Signed)
PFT done today. 

## 2015-04-16 ENCOUNTER — Encounter: Payer: Self-pay | Admitting: Internal Medicine

## 2015-04-16 ENCOUNTER — Encounter: Payer: Medicare Other | Admitting: Internal Medicine

## 2015-04-16 ENCOUNTER — Ambulatory Visit (INDEPENDENT_AMBULATORY_CARE_PROVIDER_SITE_OTHER): Payer: Medicare Other | Admitting: Internal Medicine

## 2015-04-16 VITALS — BP 132/74 | HR 66 | Ht 62.0 in | Wt 152.8 lb

## 2015-04-16 DIAGNOSIS — I495 Sick sinus syndrome: Secondary | ICD-10-CM | POA: Diagnosis not present

## 2015-04-16 DIAGNOSIS — R0602 Shortness of breath: Secondary | ICD-10-CM | POA: Diagnosis not present

## 2015-04-16 DIAGNOSIS — I482 Chronic atrial fibrillation, unspecified: Secondary | ICD-10-CM

## 2015-04-16 LAB — CUP PACEART INCLINIC DEVICE CHECK
Battery Remaining Longevity: 79 mo
Battery Voltage: 3.01 V
Brady Statistic AP VP Percent: 10.43 %
Brady Statistic AP VS Percent: 56.4 %
Brady Statistic RA Percent Paced: 66.83 %
Brady Statistic RV Percent Paced: 30.62 %
Implantable Lead Implant Date: 20140917
Implantable Lead Location: 753860
Lead Channel Impedance Value: 380 Ohm
Lead Channel Impedance Value: 456 Ohm
Lead Channel Pacing Threshold Amplitude: 1 V
Lead Channel Pacing Threshold Pulse Width: 0.4 ms
Lead Channel Sensing Intrinsic Amplitude: 0.875 mV
Lead Channel Sensing Intrinsic Amplitude: 1.125 mV
Lead Channel Setting Pacing Amplitude: 2 V
Lead Channel Setting Sensing Sensitivity: 0.9 mV
MDC IDC LEAD IMPLANT DT: 20140917
MDC IDC LEAD LOCATION: 753859
MDC IDC MSMT LEADCHNL RA IMPEDANCE VALUE: 323 Ohm
MDC IDC MSMT LEADCHNL RA IMPEDANCE VALUE: 361 Ohm
MDC IDC MSMT LEADCHNL RV PACING THRESHOLD AMPLITUDE: 1 V
MDC IDC MSMT LEADCHNL RV PACING THRESHOLD PULSEWIDTH: 0.4 ms
MDC IDC MSMT LEADCHNL RV SENSING INTR AMPL: 13.375 mV
MDC IDC MSMT LEADCHNL RV SENSING INTR AMPL: 14.625 mV
MDC IDC SESS DTM: 20170313143506
MDC IDC SET LEADCHNL RV PACING AMPLITUDE: 2.5 V
MDC IDC SET LEADCHNL RV PACING PULSEWIDTH: 0.4 ms
MDC IDC STAT BRADY AS VP PERCENT: 20.19 %
MDC IDC STAT BRADY AS VS PERCENT: 12.98 %

## 2015-04-16 MED ORDER — METOPROLOL SUCCINATE ER 25 MG PO TB24: 12.5000 mg | ORAL_TABLET | Freq: Every day | ORAL | Status: DC

## 2015-04-16 NOTE — Patient Instructions (Signed)
Medication Instructions:  Your physician has recommended you make the following change in your medication:  1) Decrease Metoprolol to 12.5 mg daily   Labwork: None ordered   Testing/Procedures: Your physician has recommended that you have a cardiopulmonary stress test (CPX). CPX testing is a non-invasive measurement of heart and lung function. It replaces a traditional treadmill stress test. This type of test provides a tremendous amount of information that relates not only to your present condition but also for future outcomes. This test combines measurements of you ventilation, respiratory gas exchange in the lungs, electrocardiogram (EKG), blood pressure and physical response before, during, and following an exercise protocol.    Follow-Up: Your physician recommends that you schedule a follow-up appointment in: 6 weeks with Dr Rayann Heman   Any Other Special Instructions Will Be Listed Below (If Applicable).     If you need a refill on your cardiac medications before your next appointment, please call your pharmacy.

## 2015-04-16 NOTE — Progress Notes (Signed)
Electrophysiology Office Note   Date:  04/16/2015   ID:  [name] [name], DOB 1940/06/19, MRN [id number]  PCP:  Jerlyn Ly, MD  Cardiologist:  Dr Acie Fredrickson Primary Electrophysiologist: Thompson Grayer, MD    Chief Complaint  Patient presents with  . Atrial Fibrillation    very little energy     History of Present Illness: [name] [name] is a 75 y.o. female who presents today for electrophysiology evaluation.  Fatigue is stable.  SOB stable.  AF burden is low. Today, she denies symptoms of palpitations, chest pain, orthopnea, PND, lower extremity edema, claudication, dizziness, presyncope, syncope, bleeding, or neurologic sequela. The patient is tolerating medications without difficulties and is otherwise without complaint today.    Past Medical History  Diagnosis Date  . Hypertension   . Hyperlipidemia     a. Hx of leg weakness while on statin. under control  . Leg fracture Dec 21,2011  . Chronic anticoagulation     on Xarelto  . GERD (gastroesophageal reflux disease)   . Parkinson's disease ([place]) 2011  . Osteoporosis 07/2007  . Persistent atrial fibrillation (HCC)     s/p atrial fib ablation 11-11-11.   . Wrist fracture     right  . Chronic diastolic CHF (congestive heart failure) (HCC)     Echocardiogram (11/24/12): Mild LVH, EF 60%.  . Heart murmur     slight  . Sinus brady-tachy syndrome ([place])   . Hypothyroidism   . Shortness of breath     "when walking at incline or stairs"  . Arthritis     thumbs  . Squamous carcinoma ([place]) summer 2015    back of left thigh  . Cataract     bilateral  . Atypical atrial flutter ([place])   . Atrial tachycardia [facility])    Past Surgical History  Procedure Laterality Date  . Knee surgery Left 2001  . US echocardiography  03-10-2008    Est EF 55-60%, Dr. Cathie Olden  . Cardiovascular stress test  06-06-2008    EF 73%  . Wrist surgery Right ~2009    metal rod in place  . Cardioversion  09/09/2011    Procedure: CARDIOVERSION;  Surgeon:  Darlin Coco, MD;  Location: [facility] ENDOSCOPY;  Service: Cardiovascular;  Laterality: N/A;  to be done by dr. Mare Ferrari  . Tee without cardioversion  11/10/2011    Procedure: TRANSESOPHAGEAL ECHOCARDIOGRAM (TEE);  Surgeon: Thayer Headings, MD;  Location: [place];  Service: Cardiovascular;  Laterality: N/A;  . Cardioversion  02/18/2012    Procedure: CARDIOVERSION;  Surgeon: Thayer Headings, MD;  Location: [place];  Service: Cardiovascular;  Laterality: N/A;  . Cholecystectomy  1995  . Femur fracture surgery  2011    metal pin in place  . Pacemaker placement  10/20/12    MDT Advisa DR implanted by Dr Lehman Prom [facility]   . Cardiac electrophysiology study and ablation  5/14, 7/14    convergent AF ablation at [facility] and subsequent atypical atrial flutter ablation by Dr Lehman Prom  . Skin cancer destruction  summer 2015  . Breast surgery      benign cyst removed  . Forearm surgery Left 2010    "opened arm to drain it"  . Tonsillectomy  as child  . Ventral hernia repair N/A 11/24/2013    Procedure: LAPAROSCOPIC VENTRAL HERNIA;  Surgeon: Michael Boston, MD;  Location: [facility];  Service: General;  Laterality: N/A;  . Insertion of mesh N/A 11/24/2013    Procedure: INSERTION OF MESH;  Surgeon: Remo Lipps  Gross, MD;  Location: [facility];  Service: General;  Laterality: N/A;  . Atrial fibrillation ablation N/A 11/11/2011    Procedure: ATRIAL FIBRILLATION ABLATION;  Surgeon: Thompson Grayer, MD;  Location: [facility] CATH LAB;  Service: Cardiovascular;  Laterality: N/A;     Current Outpatient Prescriptions  Medication Sig Dispense Refill  . acetaminophen (TYLENOL) 500 MG tablet Take 1,000 mg by mouth every 4 (four) hours as needed for mild pain or headache.    [name] amiodarone (PACERONE) 200 MG tablet Take 200 mg by mouth daily.  2  . AZILECT 1 MG TABS tablet TAKE 1 TABLET (1 MG TOTAL) BY MOUTH DAILY.  3  . BD PEN NEEDLE NANO U/F 32G X 4 MM MISC 1 each daily.     [name] CALCIUM PO Take 1 tablet by mouth every morning.     .  Calcium Polycarbophil (FIBERCON PO) Take 2 tablets by mouth every morning.     . carbidopa-levodopa (SINEMET IR) 25-100 MG tablet Take 1 tablet by mouth 3 (three) times daily. 90 tablet 5  . clonazePAM (KLONOPIN) 0.5 MG tablet Take 1 tablet (0.5 mg total) by mouth at bedtime. 30 tablet 5  . Coenzyme Q10 (CO Q 10 PO) Take 1 tablet by mouth every morning.     Mariane Baumgarten Sodium (COLACE PO) Take 1 capsule by mouth 2 (two) times daily.     [name] ELIQUIS 5 MG TABS tablet Take 5 mg by mouth 2 (two) times daily.     [name] ezetimibe (ZETIA) 10 MG tablet Take 10 mg by mouth every other day. Taking the same day as the crestor    . FORTEO 600 MCG/2.4ML SOLN Inject 20 mcg into the skin every morning.     . furosemide (LASIX) 20 MG tablet 20 mg. Take 1 1/2 tablets by mouth daily    . levothyroxine (SYNTHROID, LEVOTHROID) 75 MCG tablet Take 75 mcg by mouth daily.  11  . LUTEIN PO Take 1 tablet by mouth every morning.     [name] MAGNESIUM PO Take 1 tablet by mouth at bedtime.     . metoprolol tartrate (LOPRESSOR) 25 MG tablet Take 25 mg by mouth at bedtime.    . potassium chloride (K-DUR,KLOR-CON) 10 MEQ tablet Take 10 mEq by mouth every morning.    . rosuvastatin (CRESTOR) 5 MG tablet Take 2.5 mg by mouth every other day.    . valACYclovir (VALTREX) 500 MG tablet Take 500 mg by mouth as needed (for fever blisters).     [name] VITAMIN D, ERGOCALCIFEROL, PO Take 10,000 Units by mouth daily.     No current facility-administered medications for this visit.    Allergies:   Statins   Social History:  The patient  reports that she has never smoked. She has never used smokeless tobacco. She reports that she drinks alcohol. She reports that she does not use illicit drugs.   Family History:  The patient's family history includes Alzheimer's disease in her mother; Heart failure in her father.    ROS:  Please see the history of present illness.   All other systems are reviewed and negative.    PHYSICAL EXAM: VS:  BP 132/74 mmHg   Pulse 66  Ht 5\' 2"  (1.575 m)  Wt 152 lb 12.8 oz (69.31 kg)  BMI 27.94 kg/m2  LMP 02/04/1996 , BMI Body mass index is 27.94 kg/(m^2). GEN: Well nourished, well developed, in no acute distress HEENT: normal Neck: no JVD, carotid bruits, or masses Cardiac: tachycardic regular  rhythm; no murmurs, rubs, or gallops,no edema  Respiratory:  clear to auscultation bilaterally, normal work of breathing GI: soft, nontender, nondistended, + BS MS: no deformity or atrophy Skin: warm and dry, device pocket is well healed Neuro:  Strength and sensation are intact Psych: euthymic mood, full affect  EKG:  EKG is ordered today. The ekg ordered today shows v paced rhythm  Device interrogation is reviewed today in detail.  See PaceArt for details.   Recent Labs: No results found for requested labs within last 365 days.    Lipid Panel     Component Value Date/Time   CHOL  05/21/2007 0540    171        ATP III CLASSIFICATION:  <200     mg/dL   Desirable  200-239  mg/dL   Borderline High  >=240    mg/dL   High   TRIG 51 05/21/2007 0540   HDL 59 05/21/2007 0540   CHOLHDL 2.9 05/21/2007 0540   VLDL 10 05/21/2007 0540   LDLCALC * 05/21/2007 0540    102        Total Cholesterol/HDL:CHD Risk Coronary Heart Disease Risk Table                     Men   Women  1/2 Average Risk   3.4   3.3     Wt Readings from Last 3 Encounters:  04/16/15 152 lb 12.8 oz (69.31 kg)  03/07/15 152 lb (68.947 kg)  02/14/15 154 lb (69.854 kg)      Other studies Reviewed: Additional studies/ records that were reviewed today include: PFTs, Echo, and cardiac CT reviewed with patient today   ASSESSMENT AND PLAN:  1.  Shortness of breath/ fatigue Unclear etiology Cardiac CT did not show pulmonary vein stenosis however she does have some degree of pulmonary hypertension Echo reviewed PFTs mildly abnormal CXR and Dr Silvestre Mesi notes reviewed She did have pulmonary nodules on CT and 12 month follow-up was  advised She says that she thinks that Dr Joylene Draft has previously followed for stability.  She wishes to have this followed by him. 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  Though she will have increased Rv pacing, her intrinsic AV is >400 msec and her symptoms may improve with this change. We will reassess in 6 weeks  2. Atrial arrhythmias frequent atrial tachycardia (In this rhythm today)  AF burden is 2.5% Continue amiodarone and anticoagulation at this time  3. Tachycardia/ bradycardia syndrome Normal pacemaker function See Claudia Desanctis Art report As above  4. HTN Stable No change required today  Return to see me in 6 weeks to assess response to AV pacing change and follow-up on CPX results  Signed, Thompson Grayer, MD  04/16/2015 11:17 AM     [facility] [address] [place] [address] [phone] (office) [phone] (fax)

## 2015-04-25 ENCOUNTER — Ambulatory Visit (HOSPITAL_COMMUNITY): Payer: Medicare Other | Attending: Internal Medicine

## 2015-04-25 DIAGNOSIS — R0602 Shortness of breath: Secondary | ICD-10-CM | POA: Diagnosis not present

## 2015-04-25 DIAGNOSIS — R0600 Dyspnea, unspecified: Secondary | ICD-10-CM | POA: Diagnosis not present

## 2015-05-02 DIAGNOSIS — R7301 Impaired fasting glucose: Secondary | ICD-10-CM | POA: Diagnosis not present

## 2015-05-02 DIAGNOSIS — Z6827 Body mass index (BMI) 27.0-27.9, adult: Secondary | ICD-10-CM | POA: Diagnosis not present

## 2015-05-02 DIAGNOSIS — M810 Age-related osteoporosis without current pathological fracture: Secondary | ICD-10-CM | POA: Diagnosis not present

## 2015-05-02 DIAGNOSIS — G20 Parkinson's disease: Secondary | ICD-10-CM | POA: Diagnosis not present

## 2015-05-02 DIAGNOSIS — E038 Other specified hypothyroidism: Secondary | ICD-10-CM | POA: Diagnosis not present

## 2015-05-02 DIAGNOSIS — D649 Anemia, unspecified: Secondary | ICD-10-CM | POA: Diagnosis not present

## 2015-05-02 DIAGNOSIS — I480 Paroxysmal atrial fibrillation: Secondary | ICD-10-CM | POA: Diagnosis not present

## 2015-05-02 DIAGNOSIS — I10 Essential (primary) hypertension: Secondary | ICD-10-CM | POA: Diagnosis not present

## 2015-05-04 ENCOUNTER — Encounter: Payer: Self-pay | Admitting: Internal Medicine

## 2015-05-08 DIAGNOSIS — M9904 Segmental and somatic dysfunction of sacral region: Secondary | ICD-10-CM | POA: Diagnosis not present

## 2015-05-08 DIAGNOSIS — S32010A Wedge compression fracture of first lumbar vertebra, initial encounter for closed fracture: Secondary | ICD-10-CM | POA: Diagnosis not present

## 2015-05-08 DIAGNOSIS — M25551 Pain in right hip: Secondary | ICD-10-CM | POA: Diagnosis not present

## 2015-05-08 DIAGNOSIS — M9905 Segmental and somatic dysfunction of pelvic region: Secondary | ICD-10-CM | POA: Diagnosis not present

## 2015-05-08 DIAGNOSIS — M25552 Pain in left hip: Secondary | ICD-10-CM | POA: Diagnosis not present

## 2015-05-08 DIAGNOSIS — M9903 Segmental and somatic dysfunction of lumbar region: Secondary | ICD-10-CM | POA: Diagnosis not present

## 2015-05-09 DIAGNOSIS — M9904 Segmental and somatic dysfunction of sacral region: Secondary | ICD-10-CM | POA: Diagnosis not present

## 2015-05-09 DIAGNOSIS — M9903 Segmental and somatic dysfunction of lumbar region: Secondary | ICD-10-CM | POA: Diagnosis not present

## 2015-05-09 DIAGNOSIS — M25551 Pain in right hip: Secondary | ICD-10-CM | POA: Diagnosis not present

## 2015-05-09 DIAGNOSIS — M9905 Segmental and somatic dysfunction of pelvic region: Secondary | ICD-10-CM | POA: Diagnosis not present

## 2015-05-09 DIAGNOSIS — S32010A Wedge compression fracture of first lumbar vertebra, initial encounter for closed fracture: Secondary | ICD-10-CM | POA: Diagnosis not present

## 2015-05-09 DIAGNOSIS — M25552 Pain in left hip: Secondary | ICD-10-CM | POA: Diagnosis not present

## 2015-05-10 DIAGNOSIS — M25552 Pain in left hip: Secondary | ICD-10-CM | POA: Diagnosis not present

## 2015-05-10 DIAGNOSIS — M9904 Segmental and somatic dysfunction of sacral region: Secondary | ICD-10-CM | POA: Diagnosis not present

## 2015-05-10 DIAGNOSIS — S32010A Wedge compression fracture of first lumbar vertebra, initial encounter for closed fracture: Secondary | ICD-10-CM | POA: Diagnosis not present

## 2015-05-10 DIAGNOSIS — M25551 Pain in right hip: Secondary | ICD-10-CM | POA: Diagnosis not present

## 2015-05-10 DIAGNOSIS — M9905 Segmental and somatic dysfunction of pelvic region: Secondary | ICD-10-CM | POA: Diagnosis not present

## 2015-05-10 DIAGNOSIS — M9903 Segmental and somatic dysfunction of lumbar region: Secondary | ICD-10-CM | POA: Diagnosis not present

## 2015-05-28 ENCOUNTER — Encounter: Payer: Self-pay | Admitting: Internal Medicine

## 2015-05-28 ENCOUNTER — Ambulatory Visit (INDEPENDENT_AMBULATORY_CARE_PROVIDER_SITE_OTHER): Payer: Medicare Other | Admitting: Internal Medicine

## 2015-05-28 VITALS — BP 118/78 | HR 85 | Ht 62.0 in | Wt 153.0 lb

## 2015-05-28 DIAGNOSIS — R0602 Shortness of breath: Secondary | ICD-10-CM

## 2015-05-28 DIAGNOSIS — I495 Sick sinus syndrome: Secondary | ICD-10-CM

## 2015-05-28 DIAGNOSIS — I484 Atypical atrial flutter: Secondary | ICD-10-CM | POA: Diagnosis not present

## 2015-05-28 DIAGNOSIS — I10 Essential (primary) hypertension: Secondary | ICD-10-CM

## 2015-05-28 LAB — CUP PACEART INCLINIC DEVICE CHECK
Battery Voltage: 3 V
Brady Statistic AS VP Percent: 41.14 %
Brady Statistic RA Percent Paced: 47.04 %
Brady Statistic RV Percent Paced: 86.03 %
Implantable Lead Implant Date: 20140917
Implantable Lead Location: 753859
Implantable Lead Location: 753860
Lead Channel Impedance Value: 285 Ohm
Lead Channel Impedance Value: 342 Ohm
Lead Channel Impedance Value: 361 Ohm
Lead Channel Sensing Intrinsic Amplitude: 1 mV
Lead Channel Setting Pacing Pulse Width: 0.4 ms
MDC IDC LEAD IMPLANT DT: 20140917
MDC IDC MSMT BATTERY REMAINING LONGEVITY: 71 mo
MDC IDC MSMT LEADCHNL RV IMPEDANCE VALUE: 456 Ohm
MDC IDC MSMT LEADCHNL RV PACING THRESHOLD AMPLITUDE: 1 V
MDC IDC MSMT LEADCHNL RV PACING THRESHOLD PULSEWIDTH: 0.4 ms
MDC IDC MSMT LEADCHNL RV SENSING INTR AMPL: 16.375 mV
MDC IDC SESS DTM: 20170424173000
MDC IDC SET LEADCHNL RA PACING AMPLITUDE: 2 V
MDC IDC SET LEADCHNL RV PACING AMPLITUDE: 2.5 V
MDC IDC SET LEADCHNL RV SENSING SENSITIVITY: 0.9 mV
MDC IDC STAT BRADY AP VP PERCENT: 44.89 %
MDC IDC STAT BRADY AP VS PERCENT: 2.15 %
MDC IDC STAT BRADY AS VS PERCENT: 11.82 %

## 2015-05-28 MED ORDER — FUROSEMIDE 40 MG PO TABS: 40.0000 mg | ORAL_TABLET | Freq: Every day | ORAL | Status: DC

## 2015-05-28 MED ORDER — AMIODARONE HCL 200 MG PO TABS: 200.0000 mg | ORAL_TABLET | Freq: Two times a day (BID) | ORAL | Status: DC

## 2015-05-28 NOTE — Patient Instructions (Addendum)
Medication Instructions:  Your physician has recommended you make the following change in your medication: 1) INCREASE Amiodarone to 200 mg twice daily for 4 weeks, then reduce and take normal dosing of 200 mg once daily 3) INCREASE Furosemide to 40 mg daily    (new prescription for 40 mg tablets sent to your pharmacy)  Labwork: None ordered  Testing/Procedures: None ordered   Follow-Up:  Your physician recommends that you schedule a follow-up appointment in: 2 weeks with Roderic Palau, NP  Your physician wants you to follow-up in: 4 weeks with Dr Vallery Ridge will receive a reminder letter in the mail two months in advance. If you don't receive a letter, please call our office to schedule the follow-up appointment.   Any Other Special Instructions Will Be Listed Below (If Applicable).     If you need a refill on your cardiac medications before your next appointment, please call your pharmacy.

## 2015-05-28 NOTE — Progress Notes (Signed)
Electrophysiology Office Note   Date:  05/28/2015   ID:  [name] [name], DOB 03-20-1940, MRN [id number]  PCP:  Jerlyn Ly, MD  Cardiologist:  Dr Acie Fredrickson Primary Electrophysiologist: Thompson Grayer, MD    Chief Complaint  Patient presents with  . Shortness of Breath    with any leisure activity     History of Present Illness: [name] [name] is a 75 y.o. female who presents today for electrophysiology evaluation.  Fatigue is stable.  SOB has actually worsened over the past few weeks.  It appears that she is now in an atypical atrial flutter. Today, she denies symptoms of palpitations, chest pain, orthopnea, PND, lower extremity edema, claudication, dizziness, presyncope, syncope, bleeding, or neurologic sequela. The patient is tolerating medications without difficulties and is otherwise without complaint today.    Past Medical History  Diagnosis Date  . Hypertension   . Hyperlipidemia     a. Hx of leg weakness while on statin. under control  . Leg fracture Dec 21,2011  . Chronic anticoagulation     on Xarelto  . GERD (gastroesophageal reflux disease)   . Parkinson's disease ([place]) 2011  . Osteoporosis 07/2007  . Persistent atrial fibrillation (HCC)     s/p atrial fib ablation 11-11-11.   . Wrist fracture     right  . Chronic diastolic CHF (congestive heart failure) (HCC)     Echocardiogram (11/24/12): Mild LVH, EF 60%.  . Heart murmur     slight  . Sinus brady-tachy syndrome ([place])   . Hypothyroidism   . Shortness of breath     "when walking at incline or stairs"  . Arthritis     thumbs  . Squamous carcinoma ([place]) summer 2015    back of left thigh  . Cataract     bilateral  . Atypical atrial flutter ([place])   . Atrial tachycardia [facility])    Past Surgical History  Procedure Laterality Date  . Knee surgery Left 2001  . US echocardiography  03-10-2008    Est EF 55-60%, Dr. Cathie Olden  . Cardiovascular stress test  06-06-2008    EF 73%  . Wrist surgery Right ~2009    metal  rod in place  . Cardioversion  09/09/2011    Procedure: CARDIOVERSION;  Surgeon: Darlin Coco, MD;  Location: [facility] ENDOSCOPY;  Service: Cardiovascular;  Laterality: N/A;  to be done by dr. Mare Ferrari  . Tee without cardioversion  11/10/2011    Procedure: TRANSESOPHAGEAL ECHOCARDIOGRAM (TEE);  Surgeon: Thayer Headings, MD;  Location: [place];  Service: Cardiovascular;  Laterality: N/A;  . Cardioversion  02/18/2012    Procedure: CARDIOVERSION;  Surgeon: Thayer Headings, MD;  Location: [place];  Service: Cardiovascular;  Laterality: N/A;  . Cholecystectomy  1995  . Femur fracture surgery  2011    metal pin in place  . Pacemaker placement  10/20/12    MDT Advisa DR implanted by Dr Lehman Prom at [facility]   . Cardiac electrophysiology study and ablation  5/14, 7/14    convergent AF ablation at [facility] and subsequent atypical atrial flutter ablation by Dr Lehman Prom  . Skin cancer destruction  summer 2015  . Breast surgery      benign cyst removed  . Forearm surgery Left 2010    "opened arm to drain it"  . Tonsillectomy  as child  . Ventral hernia repair N/A 11/24/2013    Procedure: LAPAROSCOPIC VENTRAL HERNIA;  Surgeon: Michael Boston, MD;  Location: [facility] ORS;  Service: General;  Laterality: N/A;  .  Insertion of mesh N/A 11/24/2013    Procedure: INSERTION OF MESH;  Surgeon: Michael Boston, MD;  Location: [facility] ORS;  Service: General;  Laterality: N/A;  . Atrial fibrillation ablation N/A 11/11/2011    Procedure: ATRIAL FIBRILLATION ABLATION;  Surgeon: Thompson Grayer, MD;  Location: [facility] CATH LAB;  Service: Cardiovascular;  Laterality: N/A;     Current Outpatient Prescriptions  Medication Sig Dispense Refill  . acetaminophen (TYLENOL) 500 MG tablet Take 1,000 mg by mouth every 4 (four) hours as needed for mild pain or headache.    [name] amiodarone (PACERONE) 200 MG tablet Take 200 mg by mouth daily.  2  . AZILECT 1 MG TABS tablet TAKE 1 TABLET (1 MG TOTAL) BY MOUTH DAILY.  3  . BD PEN NEEDLE NANO U/F 32G X 4 MM MISC  1 each daily.     [name] CALCIUM PO Take 1 tablet by mouth every morning.     . Calcium Polycarbophil (FIBERCON PO) Take 2 tablets by mouth every morning.     . carbidopa-levodopa (SINEMET IR) 25-100 MG tablet Take 1 tablet by mouth 3 (three) times daily. 90 tablet 5  . clonazePAM (KLONOPIN) 0.5 MG tablet Take 1 tablet (0.5 mg total) by mouth at bedtime. 30 tablet 5  . Coenzyme Q10 (CO Q 10 PO) Take 1 tablet by mouth every morning.     Mariane Baumgarten Sodium (COLACE PO) Take 1 capsule by mouth 2 (two) times daily.     [name] ELIQUIS 5 MG TABS tablet Take 5 mg by mouth 2 (two) times daily.     [name] ezetimibe (ZETIA) 10 MG tablet Take 10 mg by mouth every other day. Taking the same day as the crestor    . FORTEO 600 MCG/2.4ML SOLN Inject 20 mcg into the skin every morning.     [name] levothyroxine (SYNTHROID, LEVOTHROID) 75 MCG tablet Take 75 mcg by mouth daily.  11  . LUTEIN PO Take 1 tablet by mouth every morning.     [name] MAGNESIUM PO Take 1 tablet by mouth at bedtime.     . metoprolol succinate (TOPROL-XL) 25 MG 24 hr tablet Take 0.5 tablets (12.5 mg total) by mouth daily. 45 tablet 3  . potassium chloride (K-DUR,KLOR-CON) 10 MEQ tablet Take 10 mEq by mouth every morning.    . rosuvastatin (CRESTOR) 5 MG tablet Take 2.5 mg by mouth every other day.    . valACYclovir (VALTREX) 500 MG tablet Take 500 mg by mouth as needed (for fever blisters).     [name] VITAMIN D, ERGOCALCIFEROL, PO Take 10,000 Units by mouth daily.    [name] amiodarone (PACERONE) 200 MG tablet Take 1 tablet (200 mg total) by mouth 2 (two) times daily. For 4 weeks.  Then return to normal dosing  of 200 mg daily 60 tablet 0  . furosemide (LASIX) 40 MG tablet Take 1 tablet (40 mg total) by mouth daily. 90 tablet 3   No current facility-administered medications for this visit.    Allergies:   Statins   Social History:  The patient  reports that she has never smoked. She has never used smokeless tobacco. She reports that she drinks alcohol. She reports that she  does not use illicit drugs.   Family History:  The patient's family history includes Alzheimer's disease in her mother; Heart failure in her father.    ROS:  Please see the history of present illness.   All other systems are reviewed and negative.    PHYSICAL EXAM: VS:  BP 118/78 mmHg  Pulse 85  Ht 5\' 2"  (1.575 m)  Wt 153 lb (69.4 kg)  BMI 27.98 kg/m2  SpO2 94%  LMP 02/04/1996 , BMI Body mass index is 27.98 kg/(m^2). GEN: Well nourished, well developed, in no acute distress HEENT: normal Neck: no JVD, carotid bruits, or masses Cardiac: tachycardic irregular rhythm; no murmurs, rubs, or gallops,no edema  Respiratory:  clear to auscultation bilaterally, normal work of breathing GI: soft, nontender, nondistended, + BS MS: no deformity or atrophy Skin: warm and dry, device pocket is well healed Neuro:  Strength and sensation are intact Psych: euthymic mood, full affect  EKG:  EKG is ordered today. The ekg ordered today shows atypical flutter with V paced rhythm  Device interrogation is reviewed today in detail.  See PaceArt for details.   Recent Labs: No results found for requested labs within last 365 days.    Lipid Panel     Component Value Date/Time   CHOL  05/21/2007 0540    171        ATP III CLASSIFICATION:  <200     mg/dL   Desirable  200-239  mg/dL   Borderline High  >=240    mg/dL   High   TRIG 51 05/21/2007 0540   HDL 59 05/21/2007 0540   CHOLHDL 2.9 05/21/2007 0540   VLDL 10 05/21/2007 0540   LDLCALC * 05/21/2007 0540    102        Total Cholesterol/HDL:CHD Risk Coronary Heart Disease Risk Table                     Men   Women  1/2 Average Risk   3.4   3.3     Wt Readings from Last 3 Encounters:  05/28/15 153 lb (69.4 kg)  04/16/15 152 lb 12.8 oz (69.31 kg)  03/07/15 152 lb (68.947 kg)      Other studies Reviewed: Additional studies/ records that were reviewed today include: PFTs, Echo, CPX, and cardiac CT reviewed with patient  today   ASSESSMENT AND PLAN:  1.  Shortness of breath/ fatigue Probably multifactoral Worsened with atyical atrial flutter I was able to perform noninvasive programmed stim through her device to briefly terminate atrial flutter I have turned tachycardia therapies on from her deivce which will hopefully help. I have also made rate response more sensitive to improve heart rate response to exercise based on recent CPX.  2. Atrial arrhythmias Now in atypical atrial flutter more persistently Changes as above to her device I have increased amiodarone to 200mg  BID today. Continue amiodarone and anticoagulation at this time Hopefully with antitachycardia pacing algorithm with her device, we can control her atrial flutter which I believe has worsened her symptoms. She does not wish to go back to [facility] for further procedures at this time.  3. Tachycardia/ bradycardia syndrome Normal pacemaker function See Claudia Desanctis Art report As above  4. HTN Stable No change required today  Return to see Butch Penny in 2 weeks to make sure that she is doing ok.  I will see in 4 weeks  Signed, Thompson Grayer, MD  05/28/2015 5:38 PM     [address] [place] [id number] [phone] (office) [phone] (fax)

## 2015-05-29 DIAGNOSIS — M810 Age-related osteoporosis without current pathological fracture: Secondary | ICD-10-CM | POA: Diagnosis not present

## 2015-06-02 DIAGNOSIS — I10 Essential (primary) hypertension: Secondary | ICD-10-CM | POA: Diagnosis not present

## 2015-06-02 DIAGNOSIS — J90 Pleural effusion, not elsewhere classified: Secondary | ICD-10-CM | POA: Diagnosis not present

## 2015-06-02 DIAGNOSIS — Z7901 Long term (current) use of anticoagulants: Secondary | ICD-10-CM | POA: Diagnosis not present

## 2015-06-02 DIAGNOSIS — E079 Disorder of thyroid, unspecified: Secondary | ICD-10-CM | POA: Diagnosis not present

## 2015-06-02 DIAGNOSIS — I4891 Unspecified atrial fibrillation: Secondary | ICD-10-CM | POA: Diagnosis not present

## 2015-06-02 DIAGNOSIS — R0609 Other forms of dyspnea: Secondary | ICD-10-CM | POA: Diagnosis not present

## 2015-06-02 DIAGNOSIS — R918 Other nonspecific abnormal finding of lung field: Secondary | ICD-10-CM | POA: Diagnosis not present

## 2015-06-02 DIAGNOSIS — R112 Nausea with vomiting, unspecified: Secondary | ICD-10-CM | POA: Diagnosis not present

## 2015-06-11 DIAGNOSIS — H0011 Chalazion right upper eyelid: Secondary | ICD-10-CM | POA: Diagnosis not present

## 2015-06-11 DIAGNOSIS — H401411 Capsular glaucoma with pseudoexfoliation of lens, right eye, mild stage: Secondary | ICD-10-CM | POA: Diagnosis not present

## 2015-06-11 DIAGNOSIS — H353131 Nonexudative age-related macular degeneration, bilateral, early dry stage: Secondary | ICD-10-CM | POA: Diagnosis not present

## 2015-06-11 DIAGNOSIS — H401421 Capsular glaucoma with pseudoexfoliation of lens, left eye, mild stage: Secondary | ICD-10-CM | POA: Diagnosis not present

## 2015-06-13 ENCOUNTER — Ambulatory Visit (HOSPITAL_COMMUNITY)
Admission: RE | Admit: 2015-06-13 | Discharge: 2015-06-13 | Disposition: A | Discharge status: Home / Self Care | Payer: Medicare Other | Source: Ambulatory Visit | Attending: Nurse Practitioner | Admitting: Nurse Practitioner

## 2015-06-13 ENCOUNTER — Encounter (HOSPITAL_COMMUNITY): Payer: Self-pay | Admitting: Nurse Practitioner

## 2015-06-13 ENCOUNTER — Encounter: Payer: Self-pay | Admitting: Internal Medicine

## 2015-06-13 VITALS — BP 104/58 | HR 81 | Ht 62.0 in | Wt 156.0 lb

## 2015-06-13 DIAGNOSIS — E785 Hyperlipidemia, unspecified: Secondary | ICD-10-CM | POA: Insufficient documentation

## 2015-06-13 DIAGNOSIS — I484 Atypical atrial flutter: Secondary | ICD-10-CM

## 2015-06-13 DIAGNOSIS — R0602 Shortness of breath: Secondary | ICD-10-CM | POA: Diagnosis not present

## 2015-06-13 DIAGNOSIS — R635 Abnormal weight gain: Secondary | ICD-10-CM | POA: Insufficient documentation

## 2015-06-13 DIAGNOSIS — Z79899 Other long term (current) drug therapy: Secondary | ICD-10-CM | POA: Insufficient documentation

## 2015-06-13 DIAGNOSIS — G20 Parkinson's disease: Secondary | ICD-10-CM | POA: Diagnosis not present

## 2015-06-13 DIAGNOSIS — Z950 Presence of cardiac pacemaker: Secondary | ICD-10-CM | POA: Diagnosis not present

## 2015-06-13 DIAGNOSIS — E039 Hypothyroidism, unspecified: Secondary | ICD-10-CM | POA: Diagnosis not present

## 2015-06-13 DIAGNOSIS — I10 Essential (primary) hypertension: Secondary | ICD-10-CM | POA: Insufficient documentation

## 2015-06-13 DIAGNOSIS — I5032 Chronic diastolic (congestive) heart failure: Secondary | ICD-10-CM | POA: Diagnosis not present

## 2015-06-13 DIAGNOSIS — Z7901 Long term (current) use of anticoagulants: Secondary | ICD-10-CM | POA: Insufficient documentation

## 2015-06-13 DIAGNOSIS — Z8249 Family history of ischemic heart disease and other diseases of the circulatory system: Secondary | ICD-10-CM | POA: Insufficient documentation

## 2015-06-13 DIAGNOSIS — I495 Sick sinus syndrome: Secondary | ICD-10-CM | POA: Diagnosis not present

## 2015-06-13 DIAGNOSIS — I498 Other specified cardiac arrhythmias: Secondary | ICD-10-CM | POA: Diagnosis not present

## 2015-06-13 DIAGNOSIS — Z6828 Body mass index (BMI) 28.0-28.9, adult: Secondary | ICD-10-CM | POA: Diagnosis not present

## 2015-06-13 DIAGNOSIS — R5383 Other fatigue: Secondary | ICD-10-CM | POA: Insufficient documentation

## 2015-06-13 DIAGNOSIS — K219 Gastro-esophageal reflux disease without esophagitis: Secondary | ICD-10-CM | POA: Diagnosis not present

## 2015-06-13 LAB — BASIC METABOLIC PANEL
ANION GAP: 9 (ref 5–15)
BUN: 18 mg/dL (ref 6–20)
CO2: 29 mmol/L (ref 22–32)
Calcium: 9.3 mg/dL (ref 8.9–10.3)
Chloride: 98 mmol/L — ABNORMAL LOW (ref 101–111)
Creatinine, Ser: 0.97 mg/dL (ref 0.44–1.00)
GFR calc Af Amer: >60 mL/min (ref >60)
GFR calc non Af Amer: 57 mL/min — ABNORMAL LOW (ref >60)
GLUCOSE: 106 mg/dL — AB (ref 65–99)
POTASSIUM: 3.9 mmol/L (ref 3.5–5.1)
Sodium: 136 mmol/L (ref 135–145)

## 2015-06-13 MED ORDER — METOPROLOL SUCCINATE ER 25 MG PO TB24: 25.0000 mg | ORAL_TABLET | Freq: Every day | ORAL | Status: DC

## 2015-06-13 NOTE — Patient Instructions (Signed)
Your physician has recommended you make the following change in your medication:  1)Increase lasix to 60mg  for the next 3 days 2)Decrease amiodarone to 200mg  once a day 3)Metoprolol 25mg  once a day

## 2015-06-13 NOTE — Progress Notes (Signed)
Patient ID: [name] [name], female   DOB: [birth year], 75 y.o.   MRN: [id number]     Primary Care Physician: Jerlyn Ly, MD Referring Physician: Dr. Onalee Hua Kuenzi is a 75 y.o. female with a h/o persistent afib s/p ablation 2013, CHF, Sinus brady/tachy syndrome with subsequent PPM, with chronic shortness of breath, found to be in atypical atrial flutter when seen by Dr. Rayann Heman 4/24. He performed noninvasive programmed stim through device to briefly terminate atrial flutter and he turned on tachycardia therapies form her device, mainly antitachycardia pacing algorithm.. He also increased amiodarone to 200 mg bid and increased lasix for some c/o abdominal fullness.  She is in afib clinic today and does not feel as well, more short of breath over the last few weeks since increasing amiodarone. She has also gained 3 lbs, questionable true weight gain or fluid since she states her diet as been terrible since last weekend with many high calorie/salty foods. She feels mostly bloated in the abdominal area and has some pedal edema.  Today, she denies symptoms of palpitations, chest pain, shortness of breath, orthopnea, PND, lower extremity edema, dizziness, presyncope, syncope, or neurologic sequela. The patient is tolerating medications without difficulties and is otherwise without complaint today.   Past Medical History  Diagnosis Date  . Hypertension   . Hyperlipidemia     a. Hx of leg weakness while on statin. under control  . Leg fracture Dec 21,2011  . Chronic anticoagulation     on Xarelto  . GERD (gastroesophageal reflux disease)   . Parkinson's disease ([place]) 2011  . Osteoporosis 07/2007  . Persistent atrial fibrillation (HCC)     s/p atrial fib ablation 11-11-11.   . Wrist fracture     right  . Chronic diastolic CHF (congestive heart failure) (HCC)     Echocardiogram (11/24/12): Mild LVH, EF 60%.  . Heart murmur     slight  . Sinus brady-tachy syndrome ([place])   .  Hypothyroidism   . Shortness of breath     "when walking at incline or stairs"  . Arthritis     thumbs  . Squamous carcinoma ([place]) summer 2015    back of left thigh  . Cataract     bilateral  . Atypical atrial flutter ([place])   . Atrial tachycardia [facility])    Past Surgical History  Procedure Laterality Date  . Knee surgery Left 2001  . US echocardiography  03-10-2008    Est EF 55-60%, Dr. Cathie Olden  . Cardiovascular stress test  06-06-2008    EF 73%  . Wrist surgery Right ~2009    metal rod in place  . Cardioversion  09/09/2011    Procedure: CARDIOVERSION;  Surgeon: Darlin Coco, MD;  Location: [facility] ENDOSCOPY;  Service: Cardiovascular;  Laterality: N/A;  to be done by dr. Mare Ferrari  . Tee without cardioversion  11/10/2011    Procedure: TRANSESOPHAGEAL ECHOCARDIOGRAM (TEE);  Surgeon: Thayer Headings, MD;  Location: [place];  Service: Cardiovascular;  Laterality: N/A;  . Cardioversion  02/18/2012    Procedure: CARDIOVERSION;  Surgeon: Thayer Headings, MD;  Location: [place];  Service: Cardiovascular;  Laterality: N/A;  . Cholecystectomy  1995  . Femur fracture surgery  2011    metal pin in place  . Pacemaker placement  10/20/12    MDT Advisa DR implanted by Dr Lehman Prom at [facility]   . Cardiac electrophysiology study and ablation  5/14, 7/14    convergent AF ablation at [facility] and subsequent atypical  atrial flutter ablation by Dr Lehman Prom  . Skin cancer destruction  summer 2015  . Breast surgery      benign cyst removed  . Forearm surgery Left 2010    "opened arm to drain it"  . Tonsillectomy  as child  . Ventral hernia repair N/A 11/24/2013    Procedure: LAPAROSCOPIC VENTRAL HERNIA;  Surgeon: Michael Boston, MD;  Location: [facility];  Service: General;  Laterality: N/A;  . Insertion of mesh N/A 11/24/2013    Procedure: INSERTION OF MESH;  Surgeon: Michael Boston, MD;  Location: [facility];  Service: General;  Laterality: N/A;  . Atrial fibrillation ablation N/A 11/11/2011    Procedure: ATRIAL  FIBRILLATION ABLATION;  Surgeon: Thompson Grayer, MD;  Location: [facility] CATH LAB;  Service: Cardiovascular;  Laterality: N/A;    Current Outpatient Prescriptions  Medication Sig Dispense Refill  . acetaminophen (TYLENOL) 500 MG tablet Take 1,000 mg by mouth every 4 (four) hours as needed for mild pain or headache.    [name] amiodarone (PACERONE) 200 MG tablet Take 200 mg by mouth daily.  2  . AZILECT 1 MG TABS tablet TAKE 1 TABLET (1 MG TOTAL) BY MOUTH DAILY.  3  . BD PEN NEEDLE NANO U/F 32G X 4 MM MISC 1 each daily.     [name] CALCIUM PO Take 1 tablet by mouth every morning.     . Calcium Polycarbophil (FIBERCON PO) Take 2 tablets by mouth every morning.     . carbidopa-levodopa (SINEMET IR) 25-100 MG tablet Take 1 tablet by mouth 3 (three) times daily. 90 tablet 5  . clonazePAM (KLONOPIN) 0.5 MG tablet Take 1 tablet (0.5 mg total) by mouth at bedtime. 30 tablet 5  . Coenzyme Q10 (CO Q 10 PO) Take 1 tablet by mouth every morning.     Mariane Baumgarten Sodium (COLACE PO) Take 1 capsule by mouth 2 (two) times daily.     [name] ELIQUIS 5 MG TABS tablet Take 5 mg by mouth 2 (two) times daily.     [name] ezetimibe (ZETIA) 10 MG tablet Take 10 mg by mouth every other day. Taking the same day as the crestor    . FORTEO 600 MCG/2.4ML SOLN Inject 20 mcg into the skin every morning.     . furosemide (LASIX) 40 MG tablet Take 1 tablet (40 mg total) by mouth daily. 90 tablet 3  . levothyroxine (SYNTHROID, LEVOTHROID) 75 MCG tablet Take 75 mcg by mouth daily.  11  . LUTEIN PO Take 1 tablet by mouth every morning.     [name] MAGNESIUM PO Take 1 tablet by mouth at bedtime.     . metoprolol succinate (TOPROL-XL) 25 MG 24 hr tablet Take 1 tablet (25 mg total) by mouth daily. 45 tablet 3  . potassium chloride (K-DUR,KLOR-CON) 10 MEQ tablet Take 10 mEq by mouth every morning.    . rosuvastatin (CRESTOR) 5 MG tablet Take 2.5 mg by mouth every other day.    . valACYclovir (VALTREX) 500 MG tablet Take 500 mg by mouth as needed (for fever blisters).      [name] VITAMIN D, ERGOCALCIFEROL, PO Take 10,000 Units by mouth daily.     No current facility-administered medications for this encounter.    Allergies  Allergen Reactions  . Statins     Leg weakness - but tolerating low dose Crestor every other day    Social History   Social History  . Marital Status: Married    Spouse Name: N/A  . Number of  Children: N/A  . Years of Education: N/A   Occupational History  . Not on file.   Social History Main Topics  . Smoking status: Never Smoker   . Smokeless tobacco: Never Used  . Alcohol Use: Yes     Comment: 1-2 glasses of wine daily  . Drug Use: No  . Sexual Activity:    Partners: Male    Birth Control/ Protection: Post-menopausal   Other Topics Concern  . Not on file   Social History Narrative   Lives in [place].  Retired Licensed conveyancer.    Family History  Problem Relation Age of Onset  . Alzheimer's disease Mother   . Heart failure Father     ROS- All systems are reviewed and negative except as per the HPI above  Physical Exam: Filed Vitals:   06/13/15 0930  BP: 104/58  Pulse: 81  Height: 5\' 2"  (1.575 m)  Weight: 156 lb (70.761 kg)    GEN- The patient is well appearing, alert and oriented x 3 today.   Head- normocephalic, atraumatic Eyes-  Sclera clear, conjunctiva pink Ears- hearing intact Oropharynx- clear Neck- supple, no JVP Lymph- no cervical lymphadenopathy Lungs- Clear to ausculation bilaterally, normal work of breathing Heart- Regular rate and rhythm, no murmurs, rubs or gallops, PMI not laterally displaced GI- soft, NT, ND, + BS Extremities- no clubbing, cyanosis, or edema MS- no significant deformity or atrophy Skin- no rash or lesion Psych- euthymic mood, full affect Neuro- strength and sensation are intact  EKG-AV dual paced rhythm  Epic records reviewed Interrogation by Jill/[employer] reveals normal function device currently in SR but last treated aflutter episode at 4am today. Burden 24%.  Since 4/24 longest epsiode 4 hours. Very frequent episodes of a flutter with pace termination 31%. 384 episodes treated, 120 episodes not treated.  Assessment and Plan: 1.Atrial arrhythmias Continue antitachycardia pacing algorithm, although efficacy of ATP is questionable  Continue metoprolol, eliquis  2. Shortness of breath /fatigue Pt thinks it is worse since increase in Amiodarone  Decrease amio back to 200 mg a day  3. Weight gain/abdominal fullness Increase lasix to 60 mg a day x 3 days Limit calories and salt, low sodium info given to pt bmet today   F/u Dr. Rayann Heman as scheduled 6/19

## 2015-06-14 ENCOUNTER — Ambulatory Visit (INDEPENDENT_AMBULATORY_CARE_PROVIDER_SITE_OTHER): Payer: Medicare Other | Admitting: Neurology

## 2015-06-14 ENCOUNTER — Encounter: Payer: Self-pay | Admitting: Neurology

## 2015-06-14 VITALS — BP 102/60 | HR 86 | Ht 62.0 in | Wt 155.0 lb

## 2015-06-14 DIAGNOSIS — G20 Parkinson's disease: Secondary | ICD-10-CM | POA: Diagnosis not present

## 2015-06-14 MED ORDER — CLONAZEPAM 0.5 MG PO TABS: 0.5000 mg | ORAL_TABLET | Freq: Every day | ORAL | Status: DC

## 2015-06-14 NOTE — Patient Instructions (Signed)
1. You have been referred to Neuro Rehab. They will call you directly to schedule an appointment.  Please call [phone] if you do not hear from them.

## 2015-06-14 NOTE — Progress Notes (Signed)
[name] [name] was seen today in the movement disorders clinic for neurologic consultation at the request of [name],[name], MD.  The consultation is for the evaluation of PD.  She has previously seen [name] and [name].  I have some of [name] notes.  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  She had a normal MRI brain at that time according to notes from Dr. love.  In 2011, her diagnosis was changed to Parkinson's disease and Azilect was added.  She had a second opinion at [facility] on 07/25/2009 and [name] also thought that she had Parkinson's disease.  In January, 2014 [name] added Artane, presumably for tremor.  She does think that it has helped the tremor.  02/22/13 update:  The pt is f/u today re: PD.  After our discussion last visit, she decided to stop the artane and states that while she noted a little increase in tremor, it hasn't been bad.  I had wanted to start her on carbidopa/levodopa 25/100 but she wanted to hold on that last vist.  Balance is good but she has felt more slow and stiff.  No falls.  No hallucinations.  No n/v.  No near syncope.  Not exercising faithfully.  06/20/13 update:  The pt is f/u today re: PD.  She was started on carbidopa/levodopa 25/100 last visit in January in addition to the azilect that she was already on.  She just returned from a trip from the [place] and was so surprised that she could do all of the planned excursions.  She felt great that she could do all the hiking and could get in and out of the zodiac boat.  She states that she is so glad she started taking the levodopa and thinks that her mood is better.  Her coordination and tremor are better but she still has some tremor.  Some minor stomach upset.  Pharmacist told her to take it with food and she admits that she is taking it with protein.  She last took levodopa at 7:30 am and was examined at 10am.  She is having difficulty staying asleep.  10/13/13 update:  Pt is on carbidopa/levodopa 25/100  and is supposed to be on one tid but admits that she is only taking 1/2 po bid. Did this because of GI upset.   She is having more tremor especially when walking in the L hand; she is not sure if it is any particular time of day.  It doesn't bother her too much.  Some increased difficulty buttoning clothing.  Has a URI and is leaving for [place] on Friday.  She is going for 3 weeks.    02/13/14 update:  The patient returns today for follow-up.  She reports that she is not moving as fluidly as she previously was.  Last time I told her that she needed to really increase her medication to one tablet 3 times per day but she did not do that and she is only taking one twice a day; interestingly she gets the first and the middle of the day dosage in and forgets the last.  She does get nighttime (leg/toe) cramping.  She did have a ventral hernia repair on 11/24/2013.  States that she had a difficult time recovering as she had a hematoma after.  Could not walk after surgery and has not been back to exercise but knows that she needs to.  No falls.  Little tremor.    06/07/14 update:  The patient is f/u for PD.  She is still on carbidopa/levodopa 25/100 twice per day.  She did better clinically on tid dosing but backed down on it to bid on her own.   She states that "sometimes" she takes the third dose but she often is now missing the middle of the day dose.  Sometimes, she only takes it once a day.   She is walking well; no falls.  She is exercising but states that she just had a cortisone injection into the left knee so that was a little limited.  She is having an ablation for a-fib next tues.  She states that it will be done at [facility] and it is a 3 hr procedure (because it is internal not external).  No lightheadedness.  She is still on azilect faithfully.  She takes klonopin about 1-2 times per week for insomnia.    10/11/14 update:  The patient follows up today for Parkinson's disease.  Last visit, I encouraged her  to go back up on her carbidopa/levodopa from twice per day to 3 times per day as she was more stiff and rigid last visit.  She may or may not do that but finds that most days she takes it bid. She has had some toe curling but is unsure if it is related to timing of meds.   She remains on Azilect as well as clonazepam for her insomnia.  She reports that she sleeps with this but she admits she doesn't take it regularly.  After some discussion today, we realized that she acidentally was taking temazepam instead of the klonopin.    She reports that she had another cardiac ablation since last visit and thought that it originally didn't work but now she thinks that it is.    02/14/15 update:  The patient follows up today.  She is supposed to be on carbidopa/levodopa 25/100, 3 times a day, but she generally takes it twice a day.  She is on Azilect.  Since our last visit, she has started amiodarone.  She denies falls.  She denies hallucinations.  She denies lightheadedness or near syncope.  Her husband thinks that her voice isn't as good.  She has had some SOB and wonders if it isn't from the amiodarone (but thinks that she recently had some altitidue sickness as well skiing in the mountains); she is f/u with Dr. Rayann Heman and is to have a CXR.  She does think that SOB is getting better.  She has not been able to exercise as much because of the SOB.  They have moved to the new villas [place] [facility] [place].  She is having some trouble sleeping even with klonopin.    06/14/15 update:  The patient is following up today.  I have reviewed records since previous visit.  Last visit, encouraged her to take carbidopa/levodopa 25/100, 3 times per day, as she was often only taking it twice per day, with the second dosage at bedtime.  States that she still has trouble remembering to take it and she often only takes it once a day.   She remains on Azilect.  She is also on clonazepam for insomnia but is not using it faithfully because she  doesn't think that it helps.  Mood has been okay, but admits that it goes up and down depending on how she is doing medically.  Because atrial arrhythmias, she was started on amiodarone in November, 2016.  This was increased on 05/28/2015 to 200 mg twice  a day but it was decreased back to once per day yesterday.  Pt states that she has been so SOB and she was told that it was likely from the amiodarone.  Because of SOB, she hasn't been able to exercise.  She has had 3 falls.  States that with one she was walking and talking to someone behind her and tripped over a curb.  With one, she fell when an elevator door closed on her.  With one fall, her shoe came off and she tripped.  No hallucinations.    PREVIOUS MEDICATIONS: Azilect and artane  ALLERGIES:   Allergies  Allergen Reactions  . Statins     Leg weakness - but tolerating low dose Crestor every other day    CURRENT MEDICATIONS:  Current Outpatient Prescriptions on File Prior to Visit  Medication Sig Dispense Refill  . acetaminophen (TYLENOL) 500 MG tablet Take 1,000 mg by mouth every 4 (four) hours as needed for mild pain or headache.    [name] amiodarone (PACERONE) 200 MG tablet Take 200 mg by mouth daily.  2  . AZILECT 1 MG TABS tablet TAKE 1 TABLET (1 MG TOTAL) BY MOUTH DAILY.  3  . BD PEN NEEDLE NANO U/F 32G X 4 MM MISC 1 each daily.     [name] CALCIUM PO Take 1 tablet by mouth every morning.     . Calcium Polycarbophil (FIBERCON PO) Take 2 tablets by mouth every morning.     . carbidopa-levodopa (SINEMET IR) 25-100 MG tablet Take 1 tablet by mouth 3 (three) times daily. 90 tablet 5  . clonazePAM (KLONOPIN) 0.5 MG tablet Take 1 tablet (0.5 mg total) by mouth at bedtime. 30 tablet 5  . Coenzyme Q10 (CO Q 10 PO) Take 1 tablet by mouth every morning.     Mariane Baumgarten Sodium (COLACE PO) Take 1 capsule by mouth 2 (two) times daily.     [name] ELIQUIS 5 MG TABS tablet Take 5 mg by mouth 2 (two) times daily.     [name] ezetimibe (ZETIA) 10 MG tablet Take 10 mg  by mouth every other day. Taking the same day as the crestor    . FORTEO 600 MCG/2.4ML SOLN Inject 20 mcg into the skin every morning.     . furosemide (LASIX) 40 MG tablet Take 1 tablet (40 mg total) by mouth daily. 90 tablet 3  . levothyroxine (SYNTHROID, LEVOTHROID) 75 MCG tablet Take 75 mcg by mouth daily.  11  . LUTEIN PO Take 1 tablet by mouth every morning.     [name] MAGNESIUM PO Take 1 tablet by mouth at bedtime.     . metoprolol succinate (TOPROL-XL) 25 MG 24 hr tablet Take 1 tablet (25 mg total) by mouth daily. 45 tablet 3  . potassium chloride (K-DUR,KLOR-CON) 10 MEQ tablet Take 10 mEq by mouth every morning.    . rosuvastatin (CRESTOR) 5 MG tablet Take 2.5 mg by mouth every other day.    . valACYclovir (VALTREX) 500 MG tablet Take 500 mg by mouth as needed (for fever blisters).     [name] VITAMIN D, ERGOCALCIFEROL, PO Take 10,000 Units by mouth daily.     No current facility-administered medications on file prior to visit.    PAST MEDICAL HISTORY:   Past Medical History  Diagnosis Date  . Hypertension   . Hyperlipidemia     a. Hx of leg weakness while on statin. under control  . Leg fracture Dec 21,2011  . Chronic anticoagulation  on Xarelto  . GERD (gastroesophageal reflux disease)   . Parkinson's disease ([place]) 2011  . Osteoporosis 07/2007  . Persistent atrial fibrillation (HCC)     s/p atrial fib ablation 11-11-11.   . Wrist fracture     right  . Chronic diastolic CHF (congestive heart failure) (HCC)     Echocardiogram (11/24/12): Mild LVH, EF 60%.  . Heart murmur     slight  . Sinus brady-tachy syndrome ([place])   . Hypothyroidism   . Shortness of breath     "when walking at incline or stairs"  . Arthritis     thumbs  . Squamous carcinoma ([place]) summer 2015    back of left thigh  . Cataract     bilateral  . Atypical atrial flutter ([place])   . Atrial tachycardia ([place])     PAST SURGICAL HISTORY:   Past Surgical History  Procedure Laterality Date  . Knee surgery Left  2001  . US echocardiography  03-10-2008    Est EF 55-60%, Dr. Cathie Olden  . Cardiovascular stress test  06-06-2008    EF 73%  . Wrist surgery Right ~2009    metal rod in place  . Cardioversion  09/09/2011    Procedure: CARDIOVERSION;  Surgeon: Darlin Coco, MD;  Location: [facility] ENDOSCOPY;  Service: Cardiovascular;  Laterality: N/A;  to be done by dr. Mare Ferrari  . Tee without cardioversion  11/10/2011    Procedure: TRANSESOPHAGEAL ECHOCARDIOGRAM (TEE);  Surgeon: [place] Headings, MD;  Location: [place];  Service: Cardiovascular;  Laterality: N/A;  . Cardioversion  02/18/2012    Procedure: CARDIOVERSION;  Surgeon: [place] Headings, MD;  Location: [place];  Service: Cardiovascular;  Laterality: N/A;  . Cholecystectomy  1995  . Femur fracture surgery  2011    metal pin in place  . Pacemaker placement  10/20/12    MDT Advisa DR implanted by Dr Lehman Prom at [facility]   . Cardiac electrophysiology study and ablation  5/14, 7/14    convergent AF ablation at [facility] and subsequent atypical atrial flutter ablation by Dr Lehman Prom  . Skin cancer destruction  summer 2015  . Breast surgery      benign cyst removed  . Forearm surgery Left 2010    "opened arm to drain it"  . Tonsillectomy  as child  . Ventral hernia repair N/A 11/24/2013    Procedure: LAPAROSCOPIC VENTRAL HERNIA;  Surgeon: Michael Boston, MD;  Location: [facility] ORS;  Service: General;  Laterality: N/A;  . Insertion of mesh N/A 11/24/2013    Procedure: INSERTION OF MESH;  Surgeon: Michael Boston, MD;  Location: [facility]S;  Service: General;  Laterality: N/A;  . Atrial fibrillation ablation N/A 11/11/2011    Procedure: ATRIAL FIBRILLATION ABLATION;  Surgeon: [place] Grayer, MD;  Location: [facility] CATH LAB;  Service: Cardiovascular;  Laterality: N/A;    SOCIAL HISTORY:   Social History   Social History  . Marital Status: Married    Spouse Name: N/A  . Number of Children: N/A  . Years of Education: N/A   Occupational History  . Not on file.   Social History  Main Topics  . Smoking status: Never Smoker   . Smokeless tobacco: Never Used  . Alcohol Use: Yes     Comment: 1-2 glasses of wine daily  . Drug Use: No  . Sexual Activity:    Partners: Male    Birth Control/ Protection: Post-menopausal   Other Topics Concern  . Not on file   Social History Narrative  Lives in [place].  Retired Licensed conveyancer.    FAMILY HISTORY:   Family Status  Relation Status Death Age  . Mother Deceased 44    AD   . Father Deceased 56    Valve-rheumatic fever as child  . Child Alive     healthy  . Child Alive     healthy    ROS:  A complete 10 system review of systems was obtained and was unremarkable apart from what is mentioned above.  PHYSICAL EXAMINATION:    VITALS:   Filed Vitals:   06/14/15 0836  BP: 102/60  Pulse: 86  Height: 5\' 2"  (1.575 m)  Weight: 155 lb (70.308 kg)  SpO2: 92%    GEN:  The patient appears stated age and is in NAD. HEENT:  Normocephalic, atraumatic.  The mucous membranes are moist. The superficial temporal arteries are without ropiness or tenderness. CV:  irreg irreg Lungs:  CTAB  Neurological examination:  Orientation: The patient is alert and oriented x3. Fund of knowledge is appropriate.     Movement examination: Tone: There is mild increased tone in the RUE Abnormal movements: There is a mild, intermittent resting tremor of the bilateral UE.   There is intermittent tremor of the RLE.   When she ambulates, she has a tremor of the bilateral upper extremity.  Tremor increases with distraction procedures.   Coordination:  There is mild decremation with finger taps and hand opening and closing.   Gait and Station: The patient has no difficulty arising out of a deep-seated chair without the use of the hands. The patient's stride length is mildly decreased.  Tremor increases with ambulation in the bilateral UE.  The patient has a negative pull test.      ASSESSMENT/PLAN:  1.  idiopathic tremor predominant  Parkinson's disease  -Encouraged her again to take carbidopa/levodopa 25/100, one tablet 3 times per day.  She is often taking it only once per day and now has had 3 falls.   Encouraged her to try and get all 3 dosages in.    -She will remain on the Azilect.  -encouraged exercise.  Has not been engaging in this lately  -On amiodarone  -refer to PT 2.  Insomnia.  -She has klonopin but doesn't take it faithfully as she doesn't think that it works.  Offered to try remeron but she states that she will try klonopin faithfully first and we RF that today.   3.  Follow up in the next few months, sooner should new neurologic issues arise.   Much greater than 50% of this visit was spent in counseling with the patient and the family.  Total face to face time:  30 min

## 2015-06-19 ENCOUNTER — Encounter: Payer: Self-pay | Admitting: Internal Medicine

## 2015-06-19 DIAGNOSIS — G20 Parkinson's disease: Secondary | ICD-10-CM | POA: Diagnosis not present

## 2015-06-19 DIAGNOSIS — E038 Other specified hypothyroidism: Secondary | ICD-10-CM | POA: Diagnosis not present

## 2015-06-19 DIAGNOSIS — R0602 Shortness of breath: Secondary | ICD-10-CM | POA: Diagnosis not present

## 2015-06-19 DIAGNOSIS — Z79899 Other long term (current) drug therapy: Secondary | ICD-10-CM | POA: Diagnosis not present

## 2015-06-19 DIAGNOSIS — Z6829 Body mass index (BMI) 29.0-29.9, adult: Secondary | ICD-10-CM | POA: Diagnosis not present

## 2015-06-19 DIAGNOSIS — R5383 Other fatigue: Secondary | ICD-10-CM | POA: Diagnosis not present

## 2015-06-19 DIAGNOSIS — I509 Heart failure, unspecified: Secondary | ICD-10-CM | POA: Diagnosis not present

## 2015-06-19 DIAGNOSIS — R1084 Generalized abdominal pain: Secondary | ICD-10-CM | POA: Diagnosis not present

## 2015-06-19 DIAGNOSIS — D6489 Other specified anemias: Secondary | ICD-10-CM | POA: Diagnosis not present

## 2015-06-19 DIAGNOSIS — I480 Paroxysmal atrial fibrillation: Secondary | ICD-10-CM | POA: Diagnosis not present

## 2015-06-20 ENCOUNTER — Other Ambulatory Visit: Payer: Self-pay | Admitting: Internal Medicine

## 2015-06-20 DIAGNOSIS — R1900 Intra-abdominal and pelvic swelling, mass and lump, unspecified site: Secondary | ICD-10-CM

## 2015-06-20 DIAGNOSIS — R1084 Generalized abdominal pain: Secondary | ICD-10-CM

## 2015-06-21 ENCOUNTER — Other Ambulatory Visit: Payer: Self-pay

## 2015-06-22 ENCOUNTER — Other Ambulatory Visit: Payer: Self-pay | Admitting: Neurology

## 2015-06-22 ENCOUNTER — Ambulatory Visit
Admission: RE | Admit: 2015-06-22 | Discharge: 2015-06-22 | Disposition: A | Discharge status: Home / Self Care | Payer: Medicare Other | Source: Ambulatory Visit | Attending: Internal Medicine | Admitting: Internal Medicine

## 2015-06-22 DIAGNOSIS — R1900 Intra-abdominal and pelvic swelling, mass and lump, unspecified site: Secondary | ICD-10-CM | POA: Diagnosis not present

## 2015-06-22 DIAGNOSIS — R1084 Generalized abdominal pain: Secondary | ICD-10-CM

## 2015-06-22 MED ORDER — IOPAMIDOL (ISOVUE-300) INJECTION 61%
100.0000 mL | Freq: Once | INTRAVENOUS | Status: AC | PRN
  Administered 2015-06-22: 100 mL via INTRAVENOUS

## 2015-06-23 ENCOUNTER — Other Ambulatory Visit: Payer: Self-pay | Admitting: Internal Medicine

## 2015-06-25 NOTE — Telephone Encounter (Signed)
Rx(s) sent to pharmacy electronically.  

## 2015-06-26 ENCOUNTER — Ambulatory Visit (INDEPENDENT_AMBULATORY_CARE_PROVIDER_SITE_OTHER): Payer: Medicare Other | Admitting: Obstetrics & Gynecology

## 2015-06-26 ENCOUNTER — Encounter: Payer: Self-pay | Admitting: Obstetrics & Gynecology

## 2015-06-26 VITALS — BP 128/88 | HR 52 | Resp 14 | Ht 61.5 in | Wt 153.4 lb

## 2015-06-26 DIAGNOSIS — Z01419 Encounter for gynecological examination (general) (routine) without abnormal findings: Secondary | ICD-10-CM | POA: Diagnosis not present

## 2015-06-26 DIAGNOSIS — Z124 Encounter for screening for malignant neoplasm of cervix: Secondary | ICD-10-CM

## 2015-06-26 NOTE — Progress Notes (Signed)
75 y.o. G2P2 MarriedCaucasianF here for annual exam.  Having issues with heart this year.  Has diagnosis of sick sinus syndrome and she's having issues with congestive heart failure.  Denies vaginal bleeding.    PCP:  Dr. Joylene Draft Cardiologist:  Dr. Rayann Heman.  Being seen every 2-3 months. Neurologist:  Dr. Carles Collet  Patient's last menstrual period was 02/04/1996.          Sexually active: Yes.    The current method of family planning is post menopausal status.    Exercising: Yes.    walking Smoker:  no  Health Maintenance: Pap:   05/29/2014 negative  History of abnormal Pap:  yes MMG:  03/29/2015 BIRADS 1 negative  Colonoscopy:  2015 repeat 5 years BMD:   04/2015  TDaP:  <10 years Screening Labs: PCP, Hb today: PCP, Urine today: PCP   reports that she has never smoked. She has never used smokeless tobacco. She reports that she drinks alcohol. She reports that she does not use illicit drugs.  Past Medical History  Diagnosis Date  . Hypertension   . Hyperlipidemia     a. Hx of leg weakness while on statin. under control  . Leg fracture Dec 21,2011  . Chronic anticoagulation     on Xarelto  . GERD (gastroesophageal reflux disease)   . Parkinson's disease ([place]) 2011  . Osteoporosis 07/2007  . Persistent atrial fibrillation (HCC)     s/p atrial fib ablation 11-11-11.   . Wrist fracture     right  . Chronic diastolic CHF (congestive heart failure) (HCC)     Echocardiogram (11/24/12): Mild LVH, EF 60%.  . Heart murmur     slight  . Sinus brady-tachy syndrome ([place])   . Hypothyroidism   . Shortness of breath     "when walking at incline or stairs"  . Arthritis     thumbs  . Squamous carcinoma ([place]) summer 2015    back of left thigh  . Cataract     bilateral  . Atypical atrial flutter ([place])   . Atrial tachycardia [facility])     Past Surgical History  Procedure Laterality Date  . Knee surgery Left 2001  . US echocardiography  03-10-2008    Est EF 55-60%, Dr. Cathie Olden  . Cardiovascular  stress test  06-06-2008    EF 73%  . Wrist surgery Right ~2009    metal rod in place  . Cardioversion  09/09/2011    Procedure: CARDIOVERSION;  Surgeon: [place] Coco, MD;  Location: [facility] ENDOSCOPY;  Service: Cardiovascular;  Laterality: N/A;  to be done by dr. Mare Ferrari  . Tee without cardioversion  11/10/2011    Procedure: TRANSESOPHAGEAL ECHOCARDIOGRAM (TEE);  Surgeon: [place] Headings, MD;  Location: [place];  Service: Cardiovascular;  Laterality: N/A;  . Cardioversion  02/18/2012    Procedure: CARDIOVERSION;  Surgeon: [place] Headings, MD;  Location: [place];  Service: Cardiovascular;  Laterality: N/A;  . Cholecystectomy  1995  . Femur fracture surgery  2011    metal pin in place  . Pacemaker placement  10/20/12    MDT Advisa DR implanted by Dr Lehman Prom at [facility]   . Cardiac electrophysiology study and ablation  5/14, 7/14    convergent AF ablation at [facility] and subsequent atypical atrial flutter ablation by Dr Lehman Prom  . Skin cancer destruction  summer 2015  . Breast surgery      benign cyst removed  . Forearm surgery Left 2010    "opened arm to drain it"  .  Tonsillectomy  as child  . Ventral hernia repair N/A 11/24/2013    Procedure: LAPAROSCOPIC VENTRAL HERNIA;  Surgeon: Michael Boston, MD;  [facility];  Service: General;  Laterality: N/A;  . Insertion of mesh N/A 11/24/2013    Procedure: INSERTION OF MESH;  Surgeon: Michael Boston, MD;  [facility];  Service: General;  Laterality: N/A;  . Atrial fibrillation ablation N/A 11/11/2011    Procedure: ATRIAL FIBRILLATION ABLATION;  Surgeon: [place] Grayer, MD;  Location: [facility] CATH LAB;  Service: Cardiovascular;  Laterality: N/A;    Current Outpatient Prescriptions  Medication Sig Dispense Refill  . acetaminophen (TYLENOL) 500 MG tablet Take 1,000 mg by mouth every 4 (four) hours as needed for mild pain or headache.    [name] amiodarone (PACERONE) 200 MG tablet Take 1 tablet (200 mg total) by mouth daily. 60 tablet 0  . AZILECT 1 MG TABS  tablet TAKE 1 TABLET (1 MG TOTAL) BY MOUTH DAILY.  3  . BD PEN NEEDLE NANO U/F 32G X 4 MM MISC 1 each daily.     [name] CALCIUM PO Take 1 tablet by mouth every morning.     . Calcium Polycarbophil (FIBERCON PO) Take 2 tablets by mouth every morning.     . carbidopa-levodopa (SINEMET IR) 25-100 MG tablet Take 1 tablet by mouth 3 (three) times daily. 90 tablet 5  . clonazePAM (KLONOPIN) 0.5 MG tablet Take 1 tablet (0.5 mg total) by mouth at bedtime. 30 tablet 5  . Coenzyme Q10 (CO Q 10 PO) Take 1 tablet by mouth every morning.     Mariane Baumgarten Sodium (COLACE PO) Take 1 capsule by mouth 2 (two) times daily.     [name] ELIQUIS 5 MG TABS tablet Take 5 mg by mouth 2 (two) times daily.     [name] ezetimibe (ZETIA) 10 MG tablet Take 10 mg by mouth every other day. Taking the same day as the crestor    . FORTEO 600 MCG/2.4ML SOLN Inject 20 mcg into the skin every morning.     . furosemide (LASIX) 40 MG tablet Take 1 tablet (40 mg total) by mouth daily. 90 tablet 3  . levothyroxine (SYNTHROID, LEVOTHROID) 75 MCG tablet Take 75 mcg by mouth daily.  11  . LUTEIN PO Take 1 tablet by mouth every morning.     [name] MAGNESIUM PO Take 1 tablet by mouth at bedtime.     . metoprolol succinate (TOPROL-XL) 25 MG 24 hr tablet Take 1 tablet (25 mg total) by mouth daily. 45 tablet 3  . potassium chloride (K-DUR,KLOR-CON) 10 MEQ tablet Take 10 mEq by mouth every morning.    . rosuvastatin (CRESTOR) 5 MG tablet Take 2.5 mg by mouth every other day.    . valACYclovir (VALTREX) 500 MG tablet Take 500 mg by mouth as needed (for fever blisters).     [name] VITAMIN D, ERGOCALCIFEROL, PO Take 10,000 Units by mouth daily.     No current facility-administered medications for this visit.    Family History  Problem Relation Age of Onset  . Alzheimer's disease Mother   . Heart failure Father     ROS:  Pertinent items are noted in HPI.  Otherwise, a comprehensive ROS was negative.  Exam:   Filed Vitals:   06/26/15 0851  BP: 128/88  Pulse: 52   Resp: 14  Height: 5' 1.5" (1.562 m)  Weight: 153 lb 6.4 oz (69.582 kg)    General appearance: alert, cooperative and appears stated age Head: Normocephalic,  without obvious abnormality, atraumatic Neck: no adenopathy, supple, symmetrical, trachea midline and thyroid normal to inspection and palpation Lungs: clear to auscultation bilaterally Breasts: normal appearance, no masses or tenderness Heart: irregular Abdomen: soft, non-tender; bowel sounds normal; no masses,  no organomegaly Extremities: extremities normal, atraumatic, no cyanosis or edema Skin: Skin color, texture, turgor normal. No rashes or lesions Lymph nodes: Cervical, supraclavicular, and axillary nodes normal. No abnormal inguinal nodes palpated Neurologic: Grossly normal   Pelvic: External genitalia:  no lesions              Urethra:  normal appearing urethra with no masses, tenderness or lesions              Bartholins and Skenes: normal                 Vagina: normal appearing vagina with normal color and discharge, no lesions              Cervix: no lesions              Pap taken: No. Bimanual Exam:  Uterus:  normal size, contour, position, consistency, mobility, non-tender              Adnexa: normal adnexa and no mass, fullness, tenderness               Rectovaginal: Confirms               Anus:  normal sphincter tone, no lesions  Chaperone was present for exam.  A:  Well Woman with normal exam PMP, no HRT H/O afib H/O osteoporosis, on Forteo Parkinson's Disease  P: Mammogram yearly. D/W pt 3D MMG. Neg pap 2016.  No pap today. Labs with Dr. Joylene Draft, cardiologist, neurologist.  Being followed regularly. return annually or prn

## 2015-07-23 ENCOUNTER — Ambulatory Visit (INDEPENDENT_AMBULATORY_CARE_PROVIDER_SITE_OTHER): Payer: Medicare Other | Admitting: Internal Medicine

## 2015-07-23 ENCOUNTER — Encounter: Payer: Self-pay | Admitting: Internal Medicine

## 2015-07-23 VITALS — BP 120/80 | HR 84 | Ht 62.0 in | Wt 143.0 lb

## 2015-07-23 DIAGNOSIS — I482 Chronic atrial fibrillation, unspecified: Secondary | ICD-10-CM

## 2015-07-23 DIAGNOSIS — I495 Sick sinus syndrome: Secondary | ICD-10-CM

## 2015-07-23 DIAGNOSIS — E038 Other specified hypothyroidism: Secondary | ICD-10-CM | POA: Diagnosis not present

## 2015-07-23 DIAGNOSIS — G4710 Hypersomnia, unspecified: Secondary | ICD-10-CM

## 2015-07-23 DIAGNOSIS — I484 Atypical atrial flutter: Secondary | ICD-10-CM

## 2015-07-23 DIAGNOSIS — I480 Paroxysmal atrial fibrillation: Secondary | ICD-10-CM

## 2015-07-23 DIAGNOSIS — R400 Somnolence: Secondary | ICD-10-CM

## 2015-07-23 DIAGNOSIS — R0602 Shortness of breath: Secondary | ICD-10-CM

## 2015-07-23 NOTE — Patient Instructions (Addendum)
Medication Instructions:  Your physician recommends that you continue on your current medications as directed. Please refer to the Current Medication list given to you today.   Labwork: None ordered   Testing/Procedures: Your physician has recommended that you have a sleep study. This test records several body functions during sleep, including: brain activity, eye movement, oxygen and carbon dioxide blood levels, heart rate and rhythm, breathing rate and rhythm, the flow of air through your mouth and nose, snoring, body muscle movements, and chest and belly movement.     Follow-Up: Your physician recommends that you schedule a follow-up appointment in: 2 months with Dr Rayann Heman   Remote monitoring is used to monitor your Pacemaker from home. This monitoring reduces the number of office visits required to check your device to one time per year. It allows [place] to keep an eye on the functioning of your device to ensure it is working properly. You are scheduled for a device check from home on 10/22/15. You may send your transmission at any time that day. If you have a wireless device, the transmission will be sent automatically. After your physician reviews your transmission, you will receive a postcard with your next transmission date.    Any Other Special Instructions Will Be Listed Below (If Applicable).     If you need a refill on your cardiac medications before your next appointment, please call your pharmacy.

## 2015-07-25 NOTE — Progress Notes (Signed)
Electrophysiology Office Note   Date:  07/25/2015   ID:  [name] [name], DOB 16-Dec-1940, MRN [id number]  PCP:  Jerlyn Ly, MD  Cardiologist:  Dr Acie Fredrickson Primary Electrophysiologist: Thompson Grayer, MD    Chief Complaint  Patient presents with  . Atrial Fibrillation     History of Present Illness: [name] [name] is a 75 y.o. female who presents today for electrophysiology evaluation.  With cessation of ETOH and avoidance of sodium, her SOB is much better.  She reports that recent CT ordered by PCP revealing fatter liver changes "scared her" and has promoted lifestyle change.  Today, she denies symptoms of palpitations, chest pain, orthopnea, PND, lower extremity edema, claudication, dizziness, presyncope, syncope, bleeding, or neurologic sequela. The patient is tolerating medications without difficulties and is otherwise without complaint today.    Past Medical History  Diagnosis Date  . Hypertension   . Hyperlipidemia     a. Hx of leg weakness while on statin. under control  . Leg fracture Dec 21,2011  . Chronic anticoagulation     on Xarelto  . GERD (gastroesophageal reflux disease)   . Parkinson's disease ([place]) 2011  . Osteoporosis 07/2007  . Persistent atrial fibrillation (HCC)     s/p atrial fib ablation 11-11-11.   . Wrist fracture     right  . Chronic diastolic CHF (congestive heart failure) (HCC)     Echocardiogram (11/24/12): Mild LVH, EF 60%.  . Heart murmur     slight  . Sinus brady-tachy syndrome ([place])   . Hypothyroidism   . Shortness of breath     "when walking at incline or stairs"  . Arthritis     thumbs  . Squamous carcinoma ([place]) summer 2015    back of left thigh  . Cataract     bilateral  . Atypical atrial flutter ([place])   . Atrial tachycardia [facility])    Past Surgical History  Procedure Laterality Date  . Knee surgery Left 2001  . US echocardiography  03-10-2008    Est EF 55-60%, Dr. Cathie Olden  . Cardiovascular stress test  06-06-2008    EF 73%  .  Wrist surgery Right ~2009    metal rod in place  . Cardioversion  09/09/2011    Procedure: CARDIOVERSION;  Surgeon: Darlin Coco, MD;  Location: [facility] ENDOSCOPY;  Service: Cardiovascular;  Laterality: N/A;  to be done by dr. Mare Ferrari  . Tee without cardioversion  11/10/2011    Procedure: TRANSESOPHAGEAL ECHOCARDIOGRAM (TEE);  Surgeon: Thayer Headings, MD;  Location: [place];  Service: Cardiovascular;  Laterality: N/A;  . Cardioversion  02/18/2012    Procedure: CARDIOVERSION;  Surgeon: Thayer Headings, MD;  Location: [place];  Service: Cardiovascular;  Laterality: N/A;  . Cholecystectomy  1995  . Femur fracture surgery  2011    metal pin in place  . Pacemaker placement  10/20/12    MDT Advisa DR implanted by Dr Lehman Prom at [facility]   . Cardiac electrophysiology study and ablation  5/14, 7/14    convergent AF ablation at [facility] and subsequent atypical atrial flutter ablation by Dr Lehman Prom  . Skin cancer destruction  summer 2015  . Breast surgery      benign cyst removed  . Forearm surgery Left 2010    "opened arm to drain it"  . Tonsillectomy  as child  . Ventral hernia repair N/A 11/24/2013    Procedure: LAPAROSCOPIC VENTRAL HERNIA;  Surgeon: Michael Boston, MD;  Location: [facility] ORS;  Service: General;  Laterality: N/A;  .  Insertion of mesh N/A 11/24/2013    Procedure: INSERTION OF MESH;  Surgeon: Michael Boston, MD;  Location: [facility] ORS;  Service: General;  Laterality: N/A;  . Atrial fibrillation ablation N/A 11/11/2011    Procedure: ATRIAL FIBRILLATION ABLATION;  Surgeon: Thompson Grayer, MD;  Location: [facility] CATH LAB;  Service: Cardiovascular;  Laterality: N/A;     Current Outpatient Prescriptions  Medication Sig Dispense Refill  . acetaminophen (TYLENOL) 500 MG tablet Take 1,000 mg by mouth every 4 (four) hours as needed for mild pain or headache.    [name] amiodarone (PACERONE) 200 MG tablet Take 1 tablet (200 mg total) by mouth daily. 60 tablet 0  . AZILECT 1 MG TABS tablet TAKE 1 TABLET (1 MG TOTAL)  BY MOUTH DAILY.  3  . CALCIUM PO Take 1 tablet by mouth every morning.     . Calcium Polycarbophil (FIBERCON PO) Take 2 tablets by mouth every morning.     . carbidopa-levodopa (SINEMET IR) 25-100 MG tablet Take 1 tablet by mouth 3 (three) times daily. 90 tablet 5  . clonazePAM (KLONOPIN) 0.5 MG tablet Take 1 tablet (0.5 mg total) by mouth at bedtime. 30 tablet 5  . Coenzyme Q10 (CO Q 10 PO) Take 1 tablet by mouth every morning.     Mariane Baumgarten Sodium (COLACE PO) Take 1 capsule by mouth 2 (two) times daily.     [name] ELIQUIS 5 MG TABS tablet Take 5 mg by mouth 2 (two) times daily.     [name] ezetimibe (ZETIA) 10 MG tablet Take 10 mg by mouth every other day. Taking the same day as the crestor    . furosemide (LASIX) 40 MG tablet Take 40 mg by mouth in the AM and 20 mg (1/2 tablet) in the PM    . levothyroxine (SYNTHROID, LEVOTHROID) 75 MCG tablet Take 100 mcg by mouth daily.   11  . MAGNESIUM PO Take 1 tablet by mouth at bedtime.     . metoprolol succinate (TOPROL-XL) 25 MG 24 hr tablet Take 1 tablet (25 mg total) by mouth daily. 45 tablet 3  . multivitamin-lutein (OCUVITE-LUTEIN) CAPS capsule Take 1 capsule by mouth daily.    . potassium chloride (K-DUR,KLOR-CON) 10 MEQ tablet Take 10 mEq by mouth every morning.    . rosuvastatin (CRESTOR) 5 MG tablet Take 2.5 mg by mouth every other day.    . valACYclovir (VALTREX) 500 MG tablet Take 500 mg by mouth as needed (for fever blisters).     [name] VITAMIN D, ERGOCALCIFEROL, PO Take 10,000 Units by mouth daily.     No current facility-administered medications for this visit.    Allergies:   Statins   Social History:  The patient  reports that she has never smoked. She has never used smokeless tobacco. She reports that she drinks alcohol. She reports that she does not use illicit drugs.   Family History:  The patient's family history includes Alzheimer's disease in her mother; Heart failure in her father.    ROS:  Please see the history of present illness.    All other systems are reviewed and negative.    PHYSICAL EXAM: VS:  BP 120/80 mmHg  Pulse 84  Ht 5\' 2"  (1.575 m)  Wt 143 lb (64.864 kg)  BMI 26.15 kg/m2  LMP 02/04/1996 , BMI Body mass index is 26.15 kg/(m^2). GEN: Well nourished, well developed, in no acute distress HEENT: normal Neck: no JVD, carotid bruits, or masses Cardiac: tachycardic irregular rhythm; no murmurs, rubs, or  gallops,no edema  Respiratory:  clear to auscultation bilaterally, normal work of breathing GI: soft, nontender, nondistended, + BS MS: no deformity or atrophy Skin: warm and dry, device pocket is well healed Neuro:  Strength and sensation are intact Psych: euthymic mood, full affect  EKG:  EKG is ordered today. The ekg ordered today shows AV pacing  Device interrogation is reviewed today in detail.  See PaceArt for details.   Recent Labs: 06/13/2015: BUN 18; Creatinine, Ser 0.97; Potassium 3.9; Sodium 136    Lipid Panel     Component Value Date/Time   CHOL  05/21/2007 0540    171        ATP III CLASSIFICATION:  <200     mg/dL   Desirable  200-239  mg/dL   Borderline High  >=240    mg/dL   High   TRIG 51 05/21/2007 0540   HDL 59 05/21/2007 0540   CHOLHDL 2.9 05/21/2007 0540   VLDL 10 05/21/2007 0540   LDLCALC * 05/21/2007 0540    102        Total Cholesterol/HDL:CHD Risk Coronary Heart Disease Risk Table                     Men   Women  1/2 Average Risk   3.4   3.3     Wt Readings from Last 3 Encounters:  07/23/15 143 lb (64.864 kg)  06/26/15 153 lb 6.4 oz (69.582 kg)  06/14/15 155 lb (70.308 kg)      Other studies Reviewed: Additional studies/ records that were reviewed today include: PFTs, Echo, CPX, and cardiac CT reviewed again with patient today,  Recent CT by Dr Joylene Draft also reviewed with patient.  ASSESSMENT AND PLAN:  1.  Shortness of breath/ fatigue Probably multifactoral She is now maintaining sinus and doing better.  Sodium restriction is also helping.   Unfortunately, I have very little additionally to offer.  I have offered followup with Dr Clyda Hurdle at [facility] and she declines.  2. Atrial arrhythmias Improved with amiodarone Currently taking 200mg  daily Atrial therapies programmed on from her device which may also be helping  3. Tachycardia/ bradycardia syndrome Normal pacemaker function See Pace Art report  4. HTN Stable No change required today  Return in 2 months  Signed, Thompson Grayer, MD  07/25/2015 8:26 AM     [facility] [address] [place] [address] [phone] (office) [phone] (fax)

## 2015-07-30 ENCOUNTER — Other Ambulatory Visit: Payer: Self-pay | Admitting: *Deleted

## 2015-07-30 MED ORDER — FUROSEMIDE 40 MG PO TABS: ORAL_TABLET | ORAL | Status: DC

## 2015-07-30 NOTE — Telephone Encounter (Signed)
called pt back about her lasix, her dosage increased to lasix 40 mg 1.5 tab daily, will send in correct amount to phramacy. pt aware & agreeable to plan.

## 2015-08-13 ENCOUNTER — Other Ambulatory Visit (HOSPITAL_COMMUNITY): Payer: Self-pay | Admitting: Gastroenterology

## 2015-08-13 DIAGNOSIS — K760 Fatty (change of) liver, not elsewhere classified: Secondary | ICD-10-CM | POA: Diagnosis not present

## 2015-08-22 ENCOUNTER — Other Ambulatory Visit: Payer: Self-pay | Admitting: Internal Medicine

## 2015-08-22 MED ORDER — AMIODARONE HCL 200 MG PO TABS: 200.0000 mg | ORAL_TABLET | Freq: Every day | ORAL | Status: DC

## 2015-09-12 ENCOUNTER — Encounter: Payer: Self-pay | Admitting: Internal Medicine

## 2015-09-18 ENCOUNTER — Encounter (HOSPITAL_BASED_OUTPATIENT_CLINIC_OR_DEPARTMENT_OTHER): Payer: Self-pay

## 2015-09-24 DIAGNOSIS — Z6825 Body mass index (BMI) 25.0-25.9, adult: Secondary | ICD-10-CM | POA: Diagnosis not present

## 2015-09-24 DIAGNOSIS — Z23 Encounter for immunization: Secondary | ICD-10-CM | POA: Diagnosis not present

## 2015-09-24 DIAGNOSIS — Z1389 Encounter for screening for other disorder: Secondary | ICD-10-CM | POA: Diagnosis not present

## 2015-09-24 DIAGNOSIS — R110 Nausea: Secondary | ICD-10-CM | POA: Diagnosis not present

## 2015-09-24 DIAGNOSIS — E038 Other specified hypothyroidism: Secondary | ICD-10-CM | POA: Diagnosis not present

## 2015-09-24 DIAGNOSIS — G20 Parkinson's disease: Secondary | ICD-10-CM | POA: Diagnosis not present

## 2015-09-24 DIAGNOSIS — I480 Paroxysmal atrial fibrillation: Secondary | ICD-10-CM | POA: Diagnosis not present

## 2015-09-24 DIAGNOSIS — I509 Heart failure, unspecified: Secondary | ICD-10-CM | POA: Diagnosis not present

## 2015-09-24 DIAGNOSIS — K7460 Unspecified cirrhosis of liver: Secondary | ICD-10-CM | POA: Diagnosis not present

## 2015-09-24 DIAGNOSIS — R7301 Impaired fasting glucose: Secondary | ICD-10-CM | POA: Diagnosis not present

## 2015-09-25 DIAGNOSIS — D649 Anemia, unspecified: Secondary | ICD-10-CM | POA: Diagnosis not present

## 2015-09-26 ENCOUNTER — Ambulatory Visit (HOSPITAL_BASED_OUTPATIENT_CLINIC_OR_DEPARTMENT_OTHER): Payer: Medicare Other | Attending: Internal Medicine | Admitting: Cardiovascular Disease

## 2015-09-26 VITALS — Ht 62.0 in | Wt 133.0 lb

## 2015-09-26 DIAGNOSIS — Z7901 Long term (current) use of anticoagulants: Secondary | ICD-10-CM | POA: Insufficient documentation

## 2015-09-26 DIAGNOSIS — G4710 Hypersomnia, unspecified: Secondary | ICD-10-CM | POA: Diagnosis not present

## 2015-09-26 DIAGNOSIS — G4736 Sleep related hypoventilation in conditions classified elsewhere: Secondary | ICD-10-CM | POA: Diagnosis not present

## 2015-09-26 DIAGNOSIS — G4719 Other hypersomnia: Secondary | ICD-10-CM | POA: Diagnosis not present

## 2015-09-26 DIAGNOSIS — G478 Other sleep disorders: Secondary | ICD-10-CM | POA: Diagnosis not present

## 2015-09-26 DIAGNOSIS — R0683 Snoring: Secondary | ICD-10-CM | POA: Diagnosis not present

## 2015-09-26 DIAGNOSIS — R400 Somnolence: Secondary | ICD-10-CM

## 2015-09-26 DIAGNOSIS — I493 Ventricular premature depolarization: Secondary | ICD-10-CM | POA: Insufficient documentation

## 2015-09-26 DIAGNOSIS — Z79899 Other long term (current) drug therapy: Secondary | ICD-10-CM | POA: Insufficient documentation

## 2015-09-27 ENCOUNTER — Ambulatory Visit (INDEPENDENT_AMBULATORY_CARE_PROVIDER_SITE_OTHER): Payer: Medicare Other | Admitting: Internal Medicine

## 2015-09-27 ENCOUNTER — Encounter: Payer: Self-pay | Admitting: Internal Medicine

## 2015-09-27 ENCOUNTER — Ambulatory Visit (HOSPITAL_COMMUNITY): Admission: RE | Admit: 2015-09-27 | Payer: Medicare Other | Source: Ambulatory Visit

## 2015-09-27 VITALS — BP 112/74 | HR 82 | Ht 62.0 in | Wt 138.6 lb

## 2015-09-27 DIAGNOSIS — I495 Sick sinus syndrome: Secondary | ICD-10-CM | POA: Diagnosis not present

## 2015-09-27 DIAGNOSIS — I480 Paroxysmal atrial fibrillation: Secondary | ICD-10-CM | POA: Diagnosis not present

## 2015-09-27 DIAGNOSIS — I484 Atypical atrial flutter: Secondary | ICD-10-CM

## 2015-09-27 DIAGNOSIS — I509 Heart failure, unspecified: Secondary | ICD-10-CM | POA: Diagnosis not present

## 2015-09-27 DIAGNOSIS — J90 Pleural effusion, not elsewhere classified: Secondary | ICD-10-CM | POA: Diagnosis not present

## 2015-09-27 MED ORDER — AMIODARONE HCL 200 MG PO TABS: 100.0000 mg | ORAL_TABLET | Freq: Every day | ORAL | 10 refills | Status: DC

## 2015-09-27 NOTE — Progress Notes (Signed)
Electrophysiology Office Note   Date:  09/27/2015   ID:  [name] [name], DOB [birth year], MRN [id number]  PCP:  Jerlyn Ly, MD  Cardiologist:  Dr Acie Fredrickson Primary Electrophysiologist: Thompson Grayer, MD    Chief Complaint  Patient presents with  . Atrial Fibrillation     History of Present Illness: [name] [name] is a 75 y.o. female who presents today for electrophysiology evaluation.  Continues to have occasional AF.  She reports + cough and occasional SOB.  Continues to live a healthy lifestyle.  Today, she denies symptoms of palpitations, chest pain, orthopnea, PND, lower extremity edema, claudication, dizziness, presyncope, syncope, bleeding, or neurologic sequela. The patient is tolerating medications without difficulties and is otherwise without complaint today.    Past Medical History:  Diagnosis Date  . Arthritis    thumbs  . Atrial tachycardia ([place])   . Atypical atrial flutter ([place])   . Cataract    bilateral  . Chronic anticoagulation    on Xarelto  . Chronic diastolic CHF (congestive heart failure) (HCC)    Echocardiogram (11/24/12): Mild LVH, EF 60%.  [name] GERD (gastroesophageal reflux disease)   . Heart murmur    slight  . Hyperlipidemia    a. Hx of leg weakness while on statin. under control  . Hypertension   . Hypothyroidism   . Leg fracture Dec 21,2011  . Osteoporosis 07/2007  . Parkinson's disease ([place]) 2011  . Persistent atrial fibrillation (HCC)    s/p atrial fib ablation 11-11-11.   [name] Shortness of breath    "when walking at incline or stairs"  . Sinus brady-tachy syndrome ([place])   . Squamous carcinoma ([place]) summer 2015   back of left thigh  . Wrist fracture    right   Past Surgical History:  Procedure Laterality Date  . ATRIAL FIBRILLATION ABLATION N/A 11/11/2011   Procedure: ATRIAL FIBRILLATION ABLATION;  Surgeon: Thompson Grayer, MD;  Location: [facility] CATH LAB;  Service: Cardiovascular;  Laterality: N/A;  . BREAST SURGERY     benign cyst removed  .  CARDIAC ELECTROPHYSIOLOGY STUDY AND ABLATION  5/14, 7/14   convergent AF ablation at [facility] and subsequent atypical atrial flutter ablation by Dr Lehman Prom  . CARDIOVASCULAR STRESS TEST  06-06-2008   EF 73%  . CARDIOVERSION  09/09/2011   Procedure: CARDIOVERSION;  Surgeon: Darlin Coco, MD;  Location: [facility] ENDOSCOPY;  Service: Cardiovascular;  Laterality: N/A;  to be done by dr. Mare Ferrari  . CARDIOVERSION  02/18/2012   Procedure: CARDIOVERSION;  Surgeon: Thayer Headings, MD;  Location: [place];  Service: Cardiovascular;  Laterality: N/A;  . CHOLECYSTECTOMY  1995  . FEMUR FRACTURE SURGERY  2011   metal pin in place  . FOREARM SURGERY Left 2010   "opened arm to drain it"  . INSERTION OF MESH N/A 11/24/2013   Procedure: INSERTION OF MESH;  Surgeon: Michael Boston, MD;  [facility];  Service: General;  Laterality: N/A;  . KNEE SURGERY Left 2001  . PACEMAKER PLACEMENT  10/20/12   MDT Advisa DR implanted by Dr Lehman Prom at [facility]   . SKIN CANCER DESTRUCTION  summer 2015  . TEE WITHOUT CARDIOVERSION  11/10/2011   Procedure: TRANSESOPHAGEAL ECHOCARDIOGRAM (TEE);  Surgeon: Thayer Headings, MD;  Location: [place];  Service: Cardiovascular;  Laterality: N/A;  . TONSILLECTOMY  as child  . US ECHOCARDIOGRAPHY  03-10-2008   Est EF 55-60%, Dr. Cathie Olden  . VENTRAL HERNIA REPAIR N/A 11/24/2013   Procedure: LAPAROSCOPIC VENTRAL HERNIA;  Surgeon: Michael Boston, MD;  [facility];  Service: General;  Laterality: N/A;  . WRIST SURGERY Right ~2009   metal rod in place     Current Outpatient Prescriptions  Medication Sig Dispense Refill  . acetaminophen (TYLENOL) 500 MG tablet Take 1,000 mg by mouth every 4 (four) hours as needed for mild pain or headache.    [name] amiodarone (PACERONE) 200 MG tablet Take 1 tablet (200 mg total) by mouth daily. 60 tablet 10  . AZILECT 1 MG TABS tablet TAKE 1 TABLET (1 MG TOTAL) BY MOUTH DAILY.  3  . CALCIUM PO Take 1 tablet by mouth every morning.     . Calcium Polycarbophil  (FIBERCON PO) Take 2 tablets by mouth every morning.     . carbidopa-levodopa (SINEMET IR) 25-100 MG tablet Take 1 tablet by mouth 3 (three) times daily. 90 tablet 5  . clonazePAM (KLONOPIN) 0.5 MG tablet Take 1 tablet (0.5 mg total) by mouth at bedtime. 30 tablet 5  . Coenzyme Q10 (CO Q 10 PO) Take 1 tablet by mouth every morning.     Mariane Baumgarten Sodium (COLACE PO) Take 1 capsule by mouth 2 (two) times daily.     [name] ELIQUIS 5 MG TABS tablet Take 5 mg by mouth 2 (two) times daily.     [name] ezetimibe (ZETIA) 10 MG tablet Take 10 mg by mouth every other day. Taking the same day as the crestor    . furosemide (LASIX) 40 MG tablet Take 40 mg by mouth in the AM and 20 mg (1/2 tablet) by mouth in the PM 135 tablet 3  . levothyroxine (SYNTHROID, LEVOTHROID) 100 MCG tablet Take 100 mcg by mouth daily.  2  . MAGNESIUM PO Take 1 tablet by mouth at bedtime.     . metoprolol succinate (TOPROL-XL) 25 MG 24 hr tablet Take 1 tablet (25 mg total) by mouth daily. 45 tablet 3  . multivitamin-lutein (OCUVITE-LUTEIN) CAPS capsule Take 1 capsule by mouth daily.    . potassium chloride (K-DUR,KLOR-CON) 10 MEQ tablet Take 10 mEq by mouth every morning.    . raloxifene (EVISTA) 60 MG tablet Take 60 mg by mouth daily.  3  . rosuvastatin (CRESTOR) 5 MG tablet Take 2.5 mg by mouth every other day.    . valACYclovir (VALTREX) 500 MG tablet Take 500 mg by mouth daily as needed (for fever blisters).     [name] VITAMIN D, ERGOCALCIFEROL, PO Take 1,000 Units by mouth daily.      No current facility-administered medications for this visit.     Allergies:   Statins   Social History:  The patient  reports that she has never smoked. She has never used smokeless tobacco. She reports that she drinks alcohol. She reports that she does not use drugs.   Family History:  The patient's family history includes Alzheimer's disease in her mother; Heart failure in her father.    ROS:  Please see the history of present illness.   All other  systems are reviewed and negative.    PHYSICAL EXAM: VS:  BP 112/74   Pulse 82   Ht 5\' 2"  (1.575 m)   Wt 138 lb 9.6 oz (62.9 kg)   LMP 02/04/1996   BMI 25.35 kg/m  , BMI Body mass index is 25.35 kg/m. GEN: Well nourished, well developed, in no acute distress  HEENT: normal  Neck: no JVD, carotid bruits, or masses Cardiac: tachycardic irregular rhythm; no murmurs, rubs, or gallops,no edema  Respiratory:  Decreased BS at  R base, normal work of breathing GI: soft, nontender, nondistended, + BS MS: no deformity or atrophy  Skin: warm and dry, device pocket is well healed Neuro:  Strength and sensation are intact Psych: euthymic mood, full affect  EKG:  EKG is ordered today. The ekg ordered today shows AV pacing  Device interrogation is reviewed today in detail.  See PaceArt for details.   Recent Labs: 06/13/2015: BUN 18; Creatinine, Ser 0.97; Potassium 3.9; Sodium 136    Lipid Panel     Component Value Date/Time   CHOL  05/21/2007 0540    171        ATP III CLASSIFICATION:  <200     mg/dL   Desirable  200-239  mg/dL   Borderline High  >=240    mg/dL   High   TRIG 51 05/21/2007 0540   HDL 59 05/21/2007 0540   CHOLHDL 2.9 05/21/2007 0540   VLDL 10 05/21/2007 0540   LDLCALC (H) 05/21/2007 0540    102        Total Cholesterol/HDL:CHD Risk Coronary Heart Disease Risk Table                     Men   Women  1/2 Average Risk   3.4   3.3     Wt Readings from Last 3 Encounters:  09/27/15 138 lb 9.6 oz (62.9 kg)  09/26/15 133 lb (60.3 kg)  07/23/15 143 lb (64.9 kg)      Other studies Reviewed: Additional studies/ records that were reviewed today include: PFTs from several months ago  ASSESSMENT AND PLAN:  1.  Shortness of breath/ fatigue Probably multifactoral Will obtain CXR today to evaluate for interval change. I would like to get her off of amiodarone if possible.  2. Atrial arrhythmias AF burden is 3%.  Will reduce amiodarone to 100mg  daily today.  If AF  burden remains stable then I would like to stop amiodarone upon return. Amiodarone started by Dr Clyda Hurdle.  Given SOB chronically and early cirrhosis, I think that she would be best served if we can get her off of this long term.  3. Tachycardia/ bradycardia syndrome Normal pacemaker function See Pace Art report  4. HTN Stable No change required today  Today, I have spent 25 minutes with the patient discussing SOB.  I have also spoken with Dr Joylene Draft about her complicated medical situation.  More than 50% of the visit time today was spent on this issue.    Return in 3 months  Signed, Thompson Grayer, MD  09/27/2015 12:51 PM     [facility] [address] [place] [address] [place] [address] [phone] (office) [phone] (fax)

## 2015-09-27 NOTE — Patient Instructions (Signed)
Medication Instructions:  Your physician has recommended you make the following change in your medication:  1) Decrease Amiodarone to 100 mg daily   Labwork: None ordered   Testing/Procedures: A chest x-ray takes a picture of the organs and structures inside the chest, including the heart, lungs, and blood vessels. This test can show several things, including, whether the heart is enlarges; whether fluid is building up in the lungs; and whether pacemaker / defibrillator leads are still in place.---will get at [place]: Your physician recommends that you schedule a follow-up appointment in: 3 months    Any Other Special Instructions Will Be Listed Below (If Applicable).     If you need a refill on your cardiac medications before your next appointment, please call your pharmacy.

## 2015-10-01 ENCOUNTER — Other Ambulatory Visit (HOSPITAL_COMMUNITY): Payer: Self-pay | Admitting: Internal Medicine

## 2015-10-01 DIAGNOSIS — J90 Pleural effusion, not elsewhere classified: Secondary | ICD-10-CM

## 2015-10-01 LAB — CUP PACEART INCLINIC DEVICE CHECK
Battery Remaining Longevity: 67 mo
Battery Voltage: 3 V
Brady Statistic AS VP Percent: 56.19 %
Brady Statistic AS VS Percent: 2.6 %
Brady Statistic RA Percent Paced: 41.22 %
Implantable Lead Implant Date: 20140917
Implantable Lead Location: 753859
Implantable Lead Location: 753860
Lead Channel Impedance Value: 304 Ohm
Lead Channel Impedance Value: 342 Ohm
Lead Channel Pacing Threshold Amplitude: 1 V
Lead Channel Pacing Threshold Amplitude: 1 V
Lead Channel Pacing Threshold Pulse Width: 0.4 ms
Lead Channel Pacing Threshold Pulse Width: 0.4 ms
Lead Channel Sensing Intrinsic Amplitude: 0.375 mV
Lead Channel Sensing Intrinsic Amplitude: 1 mV
Lead Channel Setting Pacing Pulse Width: 0.4 ms
MDC IDC LEAD IMPLANT DT: 20140917
MDC IDC MSMT LEADCHNL RV IMPEDANCE VALUE: 361 Ohm
MDC IDC MSMT LEADCHNL RV IMPEDANCE VALUE: 437 Ohm
MDC IDC MSMT LEADCHNL RV SENSING INTR AMPL: 12.875 mV
MDC IDC MSMT LEADCHNL RV SENSING INTR AMPL: 15.25 mV
MDC IDC SESS DTM: 20170824162802
MDC IDC SET LEADCHNL RA PACING AMPLITUDE: 2 V
MDC IDC SET LEADCHNL RV PACING AMPLITUDE: 2.5 V
MDC IDC SET LEADCHNL RV SENSING SENSITIVITY: 0.9 mV
MDC IDC STAT BRADY AP VP PERCENT: 40.17 %
MDC IDC STAT BRADY AP VS PERCENT: 1.05 %
MDC IDC STAT BRADY RV PERCENT PACED: 96.36 %

## 2015-10-03 NOTE — Procedures (Signed)
Patient Name: [name] [name], [name] [name] Date: 09/26/2015 Gender: Female D.O.B: [birth year] Age (years): 48 Referring Provider: Thompson Grayer Height (inches): 62 Interpreting Physician: Shelva Majestic MD, ABSM Weight (lbs): 133 RPSGT: Gerhard Perches BMI: 24 MRN: [id number] Neck Size: 12.50  CLINICAL INFORMATION Sleep Study Type: NPSG Indication for sleep study: Excessive Daytime Sleepiness Epworth Sleepiness Score: 10  SLEEP STUDY TECHNIQUE As per the AASM Manual for the Scoring of Sleep and Associated Events v2.3 (April 2016) with a hypopnea requiring 4% desaturations. The channels recorded and monitored were frontal, central and occipital EEG, electrooculogram (EOG), submentalis EMG (chin), nasal and oral airflow, thoracic and abdominal wall motion, anterior tibialis EMG, snore microphone, electrocardiogram, and pulse oximetry.  MEDICATIONS acetaminophen (TYLENOL) 500 MG tablet Taking -- -- Historical Provider, MD amiodarone (PACERONE) 200 MG tablet 09/27/15 -- Thompson Grayer, MD Take 0.5 tablets (100 mg total) by mouth daily. AZILECT 1 MG TABS tablet Taking 02/08/15 -- Historical Provider, MD CALCIUM PO Taking -- -- Historical Provider, MD Calcium Polycarbophil (FIBERCON PO) Taking -- -- Historical Provider, MD carbidopa-levodopa (SINEMET IR) 25-100 MG tablet Taking 03/14/15 -- Eustace Quail Tat, DO Take 1 tablet by mouth 3 (three) times daily. clonazePAM (KLONOPIN) 0.5 MG tablet Taking 06/14/15 -- Wells Guiles S Tat, DO Take 1 tablet (0.5 mg total) by mouth at bedtime. Notes:  This request is for a new prescription for a controlled substance as required by Federal/State law. Coenzyme Q10 (CO Q 10 PO) Taking -- -- Historical Provider, MD Docusate Sodium (COLACE PO) Taking -- -- Historical Provider, MD ELIQUIS 5 MG TABS tablet Taking 02/06/14 -- Historical Provider, MD ezetimibe (ZETIA) 10 MG tablet Taking -- -- Historical Provider, MD furosemide (LASIX) 40 MG tablet Taking 07/30/15  -- Thompson Grayer, MD Take 40 mg by mouth in the AM and 20 mg (1/2 tablet) by mouth in the PM levothyroxine (SYNTHROID, LEVOTHROID) 100 MCG tablet Taking 09/12/15 -- Historical Provider, MD Notes:  Received from: External Pharmacy Received Sig: TAKE 1 [place] -- -- Historical Provider, MD metoprolol succinate (TOPROL-XL) 25 MG 24 hr tablet Taking 06/13/15 -- Sherran Needs, NP Take 1 tablet (25 mg total) by mouth daily. multivitamin-lutein (OCUVITE-LUTEIN) CAPS capsule Taking -- -- Historical Provider, MD potassium chloride (K-DUR,KLOR-CON) 10 MEQ tablet Taking -- -- Historical Provider, MD raloxifene (EVISTA) 60 MG tablet Taking 08/14/15 -- Historical Provider, MD Notes:  Received from: External Pharmacy Received Sig: TAKE 1 TABLET EVERY DAY rosuvastatin (CRESTOR) 5 MG tablet Taking -- -- Historical Provider, MD valACYclovir (VALTREX) 500 MG tablet Taking -- -- Historical Provider, MD VITAMIN D, ERGOCALCIFEROL, PO  Medications self-administered by patient during sleep study : No sleep medicine administered.  SLEEP ARCHITECTURE The study was initiated at 10:24:11 PM and ended at 5:05:53 AM. Sleep onset time was 22.5 minutes and the sleep efficiency was 79.7%. The total sleep time was 320.0 minutes. Wake after sleep onset (WASO) was 59.2 minutes Stage REM latency was 167.5 minutes. The patient spent 9.22% of the night in stage N1 sleep, 80.16% in stage N2 sleep, 0.00% in stage N3 and 10.63% in REM. Alpha intrusion was absent. Supine sleep was 3.44%.  RESPIRATORY PARAMETERS The overall apnea/hypopnea index (AHI) was 2.1 per hour. There were 0 total apneas, including 0 obstructive, 0 central and 0 mixed apneas. There were 11 hypopneas and 2 RERAs. The AHI during Stage REM sleep was 5.3 per hour. AHI while supine was 0.0 per hour. The mean oxygen saturation was 88.40%. The minimum SpO2 during sleep  was 82.00%. Soft snoring was noted during this study. CARDIAC  DATA The 2 lead EKG demonstrated sinus rhythm, pacemaker generated. The mean heart rate was 73.43 beats per minute. Other EKG findings include: PVCs.  LEG MOVEMENT DATA The total PLMS were 235 with a resulting PLMS index of 44.06. Associated arousal with leg movement index was 3.9.  IMPRESSIONS - Probable increased upper airway resistance without significant obstructive sleep apnea (AHI = 2.1/h) overall, but very mild sleep apnea during REM sleep (AHI 5.3/h). - No significant central sleep apnea occurred during this study (CAI = 0.0/h). - Moderate oxygen desaturation to a nadir of  82.00%. - Abnormal sleep architecture with absence of slow wave sleep and prolongation of REM latency. - Reduced REM sleeep. - The patient snored with Soft snoring volume. - EKG findings include PVCs. - Moderate periodic limb movements of sleep occurred during the study. No significant associated arousals.  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 - Efforts should be made to optimize nasal and oropharyngeal patency. - Consider overnight oximetry for further evaluation of nocturnal oxygen desaturation. - Avoid alcohol, sedatives and other CNS depressants that may worsen sleep apnea and disrupt normal sleep architecture. - If patient is symptomatic with restless legs consider pharmocothrapy with a PLMS index of 44.06. - Sleep hygiene should be reviewed to assess factors that may improve sleep quality. - Weight management and regular exercise should be initiated or continued if appropriate.   [Electronically signed] 10/03/2015 05:40 PM  Shelva Majestic MD, [facility], ABSM Diplomate, American Board of Sleep Medicine   NPI: [id number] [place] PH: [phone]   FX: [phone] [place]

## 2015-10-04 ENCOUNTER — Ambulatory Visit (HOSPITAL_COMMUNITY)
Admission: RE | Admit: 2015-10-04 | Discharge: 2015-10-04 | Disposition: A | Discharge status: Home / Self Care | Payer: Medicare Other | Source: Ambulatory Visit | Attending: Internal Medicine | Admitting: Internal Medicine

## 2015-10-04 ENCOUNTER — Ambulatory Visit (HOSPITAL_COMMUNITY)
Admission: RE | Admit: 2015-10-04 | Discharge: 2015-10-04 | Disposition: A | Discharge status: Home / Self Care | Payer: Medicare Other | Source: Ambulatory Visit | Attending: Radiology | Admitting: Radiology

## 2015-10-04 DIAGNOSIS — R846 Abnormal cytological findings in specimens from respiratory organs and thorax: Secondary | ICD-10-CM | POA: Diagnosis not present

## 2015-10-04 DIAGNOSIS — K740 Hepatic fibrosis: Secondary | ICD-10-CM | POA: Diagnosis not present

## 2015-10-04 DIAGNOSIS — J90 Pleural effusion, not elsewhere classified: Secondary | ICD-10-CM | POA: Insufficient documentation

## 2015-10-04 DIAGNOSIS — R932 Abnormal findings on diagnostic imaging of liver and biliary tract: Secondary | ICD-10-CM | POA: Diagnosis not present

## 2015-10-04 DIAGNOSIS — K760 Fatty (change of) liver, not elsewhere classified: Secondary | ICD-10-CM | POA: Diagnosis not present

## 2015-10-04 DIAGNOSIS — Z9889 Other specified postprocedural states: Secondary | ICD-10-CM | POA: Diagnosis not present

## 2015-10-04 DIAGNOSIS — J9811 Atelectasis: Secondary | ICD-10-CM | POA: Insufficient documentation

## 2015-10-04 LAB — BODY FLUID CELL COUNT WITH DIFFERENTIAL
EOS FL: 0 %
LYMPHS FL: 38 %
Monocyte-Macrophage-Serous Fluid: 49 % — ABNORMAL LOW (ref 50–90)
NEUTROPHIL FLUID: 13 % (ref 0–25)
Total Nucleated Cell Count, Fluid: 213 cu mm (ref 0–1000)

## 2015-10-04 LAB — PROTEIN, BODY FLUID: Total protein, fluid: 3.7 g/dL

## 2015-10-04 LAB — GRAM STAIN

## 2015-10-04 LAB — GLUCOSE, SEROUS FLUID: Glucose, Fluid: 108 mg/dL

## 2015-10-04 LAB — LACTATE DEHYDROGENASE, PLEURAL OR PERITONEAL FLUID: LD FL: 74 U/L — AB (ref 3–23)

## 2015-10-04 MED ORDER — LIDOCAINE HCL (PF) 1 % IJ SOLN
INTRAMUSCULAR | Status: AC
  Filled 2015-10-04: qty 10

## 2015-10-04 NOTE — Procedures (Signed)
   US guided Right thoracentesis  1.1 L Yellow fluid  Tolerated well BP 89/61; 91/62  cxr pending

## 2015-10-05 ENCOUNTER — Encounter: Payer: Self-pay | Admitting: Internal Medicine

## 2015-10-09 LAB — CULTURE, BODY FLUID W GRAM STAIN -BOTTLE: Culture: NO GROWTH

## 2015-10-09 LAB — CULTURE, BODY FLUID-BOTTLE

## 2015-10-11 ENCOUNTER — Ambulatory Visit: Payer: Medicare Other | Admitting: Obstetrics & Gynecology

## 2015-10-11 ENCOUNTER — Telehealth: Payer: Self-pay | Admitting: *Deleted

## 2015-10-11 NOTE — Telephone Encounter (Signed)
Left message per Dr Claiborne Billings sleep study negative for sleep apnea.

## 2015-10-22 NOTE — Progress Notes (Deleted)
[name] [name] was seen today in the movement disorders clinic for neurologic consultation at the request of [name],[name], MD.  The consultation is for the evaluation of PD.  She has previously seen [name] and [name].  I have some of [name] notes.  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  She had a normal MRI brain at that time according to notes from Dr. love.  In 2011, her diagnosis was changed to Parkinson's disease and Azilect was added.  She had a second opinion at [facility] on 07/25/2009 and [name] also thought that she had Parkinson's disease.  In January, 2014 [name] added Artane, presumably for tremor.  She does think that it has helped the tremor.  02/22/13 update:  The pt is f/u today re: PD.  After our discussion last visit, she decided to stop the artane and states that while she noted a little increase in tremor, it hasn't been bad.  I had wanted to start her on carbidopa/levodopa 25/100 but she wanted to hold on that last vist.  Balance is good but she has felt more slow and stiff.  No falls.  No hallucinations.  No n/v.  No near syncope.  Not exercising faithfully.  06/20/13 update:  The pt is f/u today re: PD.  She was started on carbidopa/levodopa 25/100 last visit in January in addition to the azilect that she was already on.  She just returned from a trip from the [place] and was so surprised that she could do all of the planned excursions.  She felt great that she could do all the hiking and could get in and out of the zodiac boat.  She states that she is so glad she started taking the levodopa and thinks that her mood is better.  Her coordination and tremor are better but she still has some tremor.  Some minor stomach upset.  Pharmacist told her to take it with food and she admits that she is taking it with protein.  She last took levodopa at 7:30 am and was examined at 10am.  She is having difficulty staying asleep.  10/13/13 update:  Pt is on carbidopa/levodopa 25/100  and is supposed to be on one tid but admits that she is only taking 1/2 po bid. Did this because of GI upset.   She is having more tremor especially when walking in the L hand; she is not sure if it is any particular time of day.  It doesn't bother her too much.  Some increased difficulty buttoning clothing.  Has a URI and is leaving for [place] on Friday.  She is going for 3 weeks.    02/13/14 update:  The patient returns today for follow-up.  She reports that she is not moving as fluidly as she previously was.  Last time I told her that she needed to really increase her medication to one tablet 3 times per day but she did not do that and she is only taking one twice a day; interestingly she gets the first and the middle of the day dosage in and forgets the last.  She does get nighttime (leg/toe) cramping.  She did have a ventral hernia repair on 11/24/2013.  States that she had a difficult time recovering as she had a hematoma after.  Could not walk after surgery and has not been back to exercise but knows that she needs to.  No falls.  Little tremor.    06/07/14 update:  The patient is f/u for PD.  She is still on carbidopa/levodopa 25/100 twice per day.  She did better clinically on tid dosing but backed down on it to bid on her own.   She states that "sometimes" she takes the third dose but she often is now missing the middle of the day dose.  Sometimes, she only takes it once a day.   She is walking well; no falls.  She is exercising but states that she just had a cortisone injection into the left knee so that was a little limited.  She is having an ablation for a-fib next tues.  She states that it will be done at [facility] and it is a 3 hr procedure (because it is internal not external).  No lightheadedness.  She is still on azilect faithfully.  She takes klonopin about 1-2 times per week for insomnia.    10/11/14 update:  The patient follows up today for Parkinson's disease.  Last visit, I encouraged her  to go back up on her carbidopa/levodopa from twice per day to 3 times per day as she was more stiff and rigid last visit.  She may or may not do that but finds that most days she takes it bid. She has had some toe curling but is unsure if it is related to timing of meds.   She remains on Azilect as well as clonazepam for her insomnia.  She reports that she sleeps with this but she admits she doesn't take it regularly.  After some discussion today, we realized that she acidentally was taking temazepam instead of the klonopin.    She reports that she had another cardiac ablation since last visit and thought that it originally didn't work but now she thinks that it is.    02/14/15 update:  The patient follows up today.  She is supposed to be on carbidopa/levodopa 25/100, 3 times a day, but she generally takes it twice a day.  She is on Azilect.  Since our last visit, she has started amiodarone.  She denies falls.  She denies hallucinations.  She denies lightheadedness or near syncope.  Her husband thinks that her voice isn't as good.  She has had some SOB and wonders if it isn't from the amiodarone (but thinks that she recently had some altitidue sickness as well skiing in the mountains); she is f/u with Dr. Rayann Heman and is to have a CXR.  She does think that SOB is getting better.  She has not been able to exercise as much because of the SOB.  [place] have moved [place] the new villas [place] [facility] [place].  She is having some trouble sleeping even with klonopin.    06/14/15 update:  The patient is following up today.  I have reviewed records since previous visit.  Last visit, encouraged her to take carbidopa/levodopa 25/100, 3 times per day, as she was often only taking it twice per day, with the second dosage at bedtime.  States that she still has trouble remembering to take it and she often only takes it once a day.   She remains on Azilect.  She is also on clonazepam for insomnia but is not using it faithfully because she  doesn't think that it helps.  Mood has been okay, but admits that it goes up and down depending on how she is doing medically.  Because atrial arrhythmias, she was started on amiodarone in November, 2016.  This was increased on 05/28/2015 to 200 mg twice  a day but it was decreased back to once per day yesterday.  Pt states that she has been so SOB and she was told that it was likely from the amiodarone.  Because of SOB, she hasn't been able to exercise.  She has had 3 falls.  States that with one she was walking and talking to someone behind her and tripped over a curb.  With one, she fell when an elevator door closed on her.  With one fall, her shoe came off and she tripped.  No hallucinations.    10/24/15 update:  The patient follows up today for her Parkinson's disease.  She has been noncompliant with levodopa dosing, and I again encouraged her last visit to take carbidopa/levodopa 25/100, one tablet 3 times per day.  She states that ***.  She is on Azilect, 1 mg daily.  Remains on amiodarone (but had dx of PD prior to its use) but dose reduced at end of Aug by Dr. Rayann Heman and if she continues to do well, plans are to d/c that medication.  I reviewed cardiology records.  No falls since last visit.  Had a pleural effusion ***.  PREVIOUS MEDICATIONS: Azilect and artane  ALLERGIES:   Allergies  Allergen Reactions  . Statins     Leg weakness - but tolerating low dose Crestor every other day    CURRENT MEDICATIONS:  Current Outpatient Prescriptions on File Prior to Visit  Medication Sig Dispense Refill  . acetaminophen (TYLENOL) 500 MG tablet Take 1,000 mg by mouth every 4 (four) hours as needed for mild pain or headache.    [name] amiodarone (PACERONE) 200 MG tablet Take 0.5 tablets (100 mg total) by mouth daily. 60 tablet 10  . AZILECT 1 MG TABS tablet TAKE 1 TABLET (1 MG TOTAL) BY MOUTH DAILY.  3  . CALCIUM PO Take 1 tablet by mouth every morning.     . Calcium Polycarbophil (FIBERCON PO) Take 2  tablets by mouth every morning.     . carbidopa-levodopa (SINEMET IR) 25-100 MG tablet Take 1 tablet by mouth 3 (three) times daily. 90 tablet 5  . clonazePAM (KLONOPIN) 0.5 MG tablet Take 1 tablet (0.5 mg total) by mouth at bedtime. 30 tablet 5  . Coenzyme Q10 (CO Q 10 PO) Take 1 tablet by mouth every morning.     Mariane Baumgarten Sodium (COLACE PO) Take 1 capsule by mouth 2 (two) times daily.     [name] ELIQUIS 5 MG TABS tablet Take 5 mg by mouth 2 (two) times daily.     [name] ezetimibe (ZETIA) 10 MG tablet Take 10 mg by mouth every other day. Taking the same day as the crestor    . furosemide (LASIX) 40 MG tablet Take 40 mg by mouth in the AM and 20 mg (1/2 tablet) by mouth in the PM 135 tablet 3  . levothyroxine (SYNTHROID, LEVOTHROID) 100 MCG tablet Take 100 mcg by mouth daily.  2  . MAGNESIUM PO Take 1 tablet by mouth at bedtime.     . metoprolol succinate (TOPROL-XL) 25 MG 24 hr tablet Take 1 tablet (25 mg total) by mouth daily. 45 tablet 3  . multivitamin-lutein (OCUVITE-LUTEIN) CAPS capsule Take 1 capsule by mouth daily.    . potassium chloride (K-DUR,KLOR-CON) 10 MEQ tablet Take 10 mEq by mouth every morning.    . raloxifene (EVISTA) 60 MG tablet Take 60 mg by mouth daily.  3  . rosuvastatin (CRESTOR) 5 MG tablet Take 2.5 mg by mouth  every other day.    . valACYclovir (VALTREX) 500 MG tablet Take 500 mg by mouth daily as needed (for fever blisters).     [name] VITAMIN D, ERGOCALCIFEROL, PO Take 1,000 Units by mouth daily.      No current facility-administered medications on file prior to visit.     PAST MEDICAL HISTORY:   Past Medical History:  Diagnosis Date  . Arthritis    thumbs  . Atrial tachycardia ([place])   . Atypical atrial flutter ([place])   . Cataract    bilateral  . Chronic anticoagulation    on Xarelto  . Chronic diastolic CHF (congestive heart failure) (HCC)    Echocardiogram (11/24/12): Mild LVH, EF 60%.  [name] GERD (gastroesophageal reflux disease)   . Heart murmur    slight  .  Hyperlipidemia    a. Hx of leg weakness while on statin. under control  . Hypertension   . Hypothyroidism   . Leg fracture Dec 21,2011  . Osteoporosis 07/2007  . Parkinson's disease ([place]) 2011  . Persistent atrial fibrillation (HCC)    s/p atrial fib ablation 11-11-11.   [name] Shortness of breath    "when walking at incline or stairs"  . Sinus brady-tachy syndrome ([place])   . Squamous carcinoma ([place]) summer 2015   back of left thigh  . Wrist fracture    right    PAST SURGICAL HISTORY:   Past Surgical History:  Procedure Laterality Date  . ATRIAL FIBRILLATION ABLATION N/A 11/11/2011   Procedure: ATRIAL FIBRILLATION ABLATION;  Surgeon: Thompson Grayer, MD;  Location: [facility] CATH LAB;  Service: Cardiovascular;  Laterality: N/A;  . BREAST SURGERY     benign cyst removed  . CARDIAC ELECTROPHYSIOLOGY STUDY AND ABLATION  5/14, 7/14   convergent AF ablation at [facility] and subsequent atypical atrial flutter ablation by Dr Lehman Prom  . CARDIOVASCULAR STRESS TEST  06-06-2008   EF 73%  . CARDIOVERSION  09/09/2011   Procedure: CARDIOVERSION;  Surgeon: Darlin Coco, MD;  Location[facility] ENDOSCOPY;  Service: Cardiovascular;  Laterality: N/A;  to be done by dr. Mare Ferrari  . CARDIOVERSION  02/18/2012   Procedure: CARDIOVERSION;  Surgeon: Thayer Headings, MD;  Location: [place];  Service: Cardiovascular;  Laterality: N/A;  . CHOLECYSTECTOMY  1995  . FEMUR FRACTURE SURGERY  2011   metal pin in place  . FOREARM SURGERY Left 2010   "opened arm to drain it"  . INSERTION OF MESH N/A 11/24/2013   Procedure: INSERTION OF MESH;  Surgeon: Michael Boston, MD;  Location: [facility] ORS;  Service: General;  Laterality: N/A;  . KNEE SURGERY Left 2001  . PACEMAKER PLACEMENT  10/20/12   MDT Advisa DR implanted by Dr Lehman Prom at [facility]   . SKIN CANCER DESTRUCTION  summer 2015  . TEE WITHOUT CARDIOVERSION  11/10/2011   Procedure: TRANSESOPHAGEAL ECHOCARDIOGRAM (TEE);  Surgeon: Thayer Headings, MD;  Location: [place];  Service:  Cardiovascular;  Laterality: N/A;  . TONSILLECTOMY  as child  . US ECHOCARDIOGRAPHY  03-10-2008   Est EF 55-60%, Dr. Cathie Olden  . VENTRAL HERNIA REPAIR N/A 11/24/2013   Procedure: LAPAROSCOPIC VENTRAL HERNIA;  Surgeon: Michael Boston, MD;  Location: [facility];  Service: General;  Laterality: N/A;  . WRIST SURGERY Right ~2009   metal rod in place    SOCIAL HISTORY:   Social History   Social History  . Marital status: Married    Spouse name: N/A  . Number of children: N/A  . Years of education: N/A  Occupational History  . Not on file.   Social History Main Topics  . Smoking status: Never Smoker  . Smokeless tobacco: Never Used  . Alcohol use Yes     Comment: 1-2 glasses of wine daily  . Drug use: No  . Sexual activity: Yes    Partners: Male    Birth control/ protection: Post-menopausal   Other Topics Concern  . Not on file   Social History Narrative   Lives in [place].  Retired Licensed conveyancer.    FAMILY HISTORY:   Family Status  Relation Status  . Mother Deceased at age 22   AD   . Father Deceased at age 15   Valve-rheumatic fever as child  . Child Alive   healthy  . Child Alive   healthy    ROS:  A complete 10 system review of systems was obtained and was unremarkable apart from what is mentioned above.  PHYSICAL EXAMINATION:    VITALS:   There were no vitals filed for this visit.  GEN:  The patient appears stated age and is in NAD. HEENT:  Normocephalic, atraumatic.  The mucous membranes are moist. The superficial temporal arteries are without ropiness or tenderness. CV:  irreg irreg Lungs:  CTAB  Neurological examination:  Orientation: The patient is alert and oriented x3. Fund of knowledge is appropriate.     Movement examination: Tone: There is mild increased tone in the RUE Abnormal movements: There is a mild, intermittent resting tremor of the bilateral UE.   There is intermittent tremor of the RLE.   When she ambulates, she has a tremor of the  bilateral upper extremity.  Tremor increases with distraction procedures.   Coordination:  There is mild decremation with finger taps and hand opening and closing.   Gait and Station: The patient has no difficulty arising out of a deep-seated chair without the use of the hands. The patient's stride length is mildly decreased.  Tremor increases with ambulation in the bilateral UE.  The patient has a negative pull test.      ASSESSMENT/PLAN:  1.  idiopathic tremor predominant Parkinson's disease  -Encouraged her again to take carbidopa/levodopa 25/100, one tablet 3 times per day.  She is often taking it only once per day and now has had 3 falls.   Encouraged her to try and get all 3 dosages in.    -She will remain on the Azilect.  -encouraged exercise.  Has not been engaging in this lately  -On amiodarone, which certainly can contribute to tremor and more rarely, can cause parkinsonism.  -refer to PT 2.  Insomnia.  -She has klonopin but doesn't take it faithfully as she doesn't think that it works.  Offered to try remeron but she states that she will try klonopin faithfully first and we RF that today.   3.  Follow up in the next few months, sooner should new neurologic issues arise.   Much greater than 50% of this visit was spent in counseling with the patient and the family.  Total face to face time:  30 min

## 2015-10-24 ENCOUNTER — Ambulatory Visit: Payer: Self-pay | Admitting: Neurology

## 2015-10-24 ENCOUNTER — Ambulatory Visit: Payer: Medicare Other | Admitting: Physical Therapy

## 2015-10-30 ENCOUNTER — Ambulatory Visit: Payer: Medicare Other | Attending: Neurology | Admitting: Physical Therapy

## 2015-10-30 DIAGNOSIS — R2689 Other abnormalities of gait and mobility: Secondary | ICD-10-CM | POA: Diagnosis not present

## 2015-10-30 DIAGNOSIS — R293 Abnormal posture: Secondary | ICD-10-CM | POA: Diagnosis not present

## 2015-10-30 DIAGNOSIS — J90 Pleural effusion, not elsewhere classified: Secondary | ICD-10-CM | POA: Diagnosis not present

## 2015-10-30 DIAGNOSIS — R29818 Other symptoms and signs involving the nervous system: Secondary | ICD-10-CM | POA: Diagnosis not present

## 2015-10-31 NOTE — Therapy (Signed)
[facility] [address] [place], [place], [address] Phone: [phone]   Fax:  [phone]  Physical Therapy Evaluation  Patient Details  Name: [name] [name] MRN: [id number] Date of Birth: February 10, 1940 Referring Provider: Tat  Encounter Date: 10/30/2015      PT End of Session - 10/31/15 1129    Visit Number 1   Number of Visits 9   Date for PT Re-Evaluation 11/29/15   Authorization Type Medicare; [employer] secondary-GCODE every 10th visit   PT Start Time 1132   PT Stop Time 1215   PT Time Calculation (min) 43 min   Activity Tolerance Patient tolerated treatment well   Behavior During Therapy [facility] for tasks assessed/performed      Past Medical History:  Diagnosis Date  . Arthritis    thumbs  . Atrial tachycardia ([place])   . Atypical atrial flutter ([place])   . Cataract    bilateral  . Chronic anticoagulation    on Xarelto  . Chronic diastolic CHF (congestive heart failure) (HCC)    Echocardiogram (11/24/12): Mild LVH, EF 60%.  [name] GERD (gastroesophageal reflux disease)   . Heart murmur    slight  . Hyperlipidemia    a. Hx of leg weakness while on statin. under control  . Hypertension   . Hypothyroidism   . Leg fracture Dec 21,2011  . Osteoporosis 07/2007  . Parkinson's disease ([place]) 2011  . Persistent atrial fibrillation (HCC)    s/p atrial fib ablation 11-11-11.   [name] Shortness of breath    "when walking at incline or stairs"  . Sinus brady-tachy syndrome ([place])   . Squamous carcinoma ([place]) summer 2015   back of left thigh  . Wrist fracture    right    Past Surgical History:  Procedure Laterality Date  . ATRIAL FIBRILLATION ABLATION N/A 11/11/2011   Procedure: ATRIAL FIBRILLATION ABLATION;  Surgeon: Thompson Grayer, MD;  Location: [facility] CATH LAB;  Service: Cardiovascular;  Laterality: N/A;  . BREAST SURGERY     benign cyst removed  . CARDIAC ELECTROPHYSIOLOGY STUDY AND ABLATION  5/14, 7/14   convergent AF  ablation at [facility] and subsequent atypical atrial flutter ablation by Dr Lehman Prom  . CARDIOVASCULAR STRESS TEST  06-06-2008   EF 73%  . CARDIOVERSION  09/09/2011   Procedure: CARDIOVERSION;  Surgeon: Darlin Coco, MD;  Location: [facility] ENDOSCOPY;  Service: Cardiovascular;  Laterality: N/A;  to be done by dr. Mare Ferrari  . CARDIOVERSION  02/18/2012   Procedure: CARDIOVERSION;  Surgeon: Thayer Headings, MD;  Location: [facility];  Service: Cardiovascular;  Laterality: N/A;  . CHOLECYSTECTOMY  1995  . FEMUR FRACTURE SURGERY  2011   metal pin in place  . FOREARM SURGERY Left 2010   "opened arm to drain it"  . INSERTION OF MESH N/A 11/24/2013   Procedure: INSERTION OF MESH;  Surgeon: Michael Boston, MD;  Location: [facility] ORS;  Service: General;  Laterality: N/A;  . KNEE SURGERY Left 2001  . PACEMAKER PLACEMENT  10/20/12   MDT Advisa DR implanted by Dr Lehman Prom [facility]   . SKIN CANCER DESTRUCTION  summer 2015  . TEE WITHOUT CARDIOVERSION  11/10/2011   Procedure: TRANSESOPHAGEAL ECHOCARDIOGRAM (TEE);  Surgeon: Thayer Headings, MD;  Location: [facility];  Service: Cardiovascular;  Laterality: N/A;  . TONSILLECTOMY  as child  . US ECHOCARDIOGRAPHY  03-10-2008   Est EF 55-60%, Dr. Cathie Olden  . VENTRAL HERNIA REPAIR N/A 11/24/2013   Procedure: LAPAROSCOPIC VENTRAL HERNIA;  Surgeon: Michael Boston,  MD;  Location: [facility] ORS;  Service: General;  Laterality: N/A;  . WRIST SURGERY Right ~2009   metal rod in place    There were no vitals filed for this visit.       Subjective Assessment - 10/30/15 1138    Subjective Pt is a 75 year old female who presents with PD, upon recommendation from neurologist.  Pt feels she has had Parkinson's for approximately 6 years.  She has tremors that are not too bad, micrographia, decreased balance.  She feels she doesn't walk as fast as she used to.  (Also has heart issues causing SOB).  Pt reports stumbling twice due to R foot catching, and caught herself on the ground both times.      Patient Stated Goals Pt's goal for therapy is to help Parkinson's to not get worse.   Currently in Pain? No/denies            [facility] PT Assessment - 10/31/15 1141      Assessment   Medical Diagnosis Parkinson's disease   Referring Provider Tat   Onset Date/Surgical Date --  May 2017     Precautions   Precautions Fall     Balance Screen   Has the patient fallen in the past 6 months Yes   How many times? 2   Has the patient had a decrease in activity level because of a fear of falling?  No   Is the patient reluctant to leave their home because of a fear of falling?  No     Home Environment   Living Environment Private residence   Living Arrangements Spouse/significant other   Available Help at Discharge Family   Type of [place] Access Level entry   [place] --  Has walking sticks     Prior Function   Level of Independence Independent with household mobility without device;Independent with community mobility without device;Independent with basic ADLs  Takes extra time with ADLs   Leisure Enjoys travelling; walks 20 min/day most days     Observation/Other Assessments   Focus on Therapeutic Outcomes (FOTO)  NA     Posture/Postural Control   Posture/Postural Control Postural limitations   Postural Limitations Rounded Shoulders     Strength   Overall Strength Comments Grossly tested at least 4+/5 bilateral lower extremities     Transfers   Transfers Sit to Stand;Stand to Sit   Sit to Stand 6: Modified independent (Device/Increase time);Without upper extremity assist;From chair/3-in-1   Five time sit to stand comments  10.26   Stand to Sit 6: Modified independent (Device/Increase time);Without upper extremity assist;To chair/3-in-1     Ambulation/Gait   Ambulation/Gait Yes   Ambulation/Gait Assistance 7: Independent   Ambulation Distance (Feet) 250 Feet   Assistive device None   Gait Pattern Decreased  arm swing - right;Decreased arm swing - left;Step-through pattern;Decreased step length - right;Decreased step length - left;Decreased stance time - right;Narrow base of support;Decreased trunk rotation;Poor foot clearance - right  episodes of R foot catching   Ambulation Surface Level;Indoor   Gait velocity 11.15 sec = 2.94 ft/sec   Gait Comments 3 minute walk test:  561 ft (1st minute-pt ambulated 212 ft)     Standardized Balance Assessment   Standardized Balance Assessment Timed Up and Go Test     Timed Up and Go Test   Normal TUG (seconds) 10.37   Manual TUG (seconds) 10.68  Cognitive TUG (seconds) 10.81     High Level Balance   High Level Balance Comments MiniBESTest:  23/28      RLE Tone   RLE Tone Within Functional Limits     LLE Tone   LLE Tone Within Functional Limits        Mini-BESTest: Balance Evaluation Systems Test  2005-2013 [employer]. All rights reserved. ________________________________________________________________________________________Anticipatory_________Subscore___5__/6 1. SIT TO STAND Instruction: "Cross your arms across your chest. Try not to use your hands unless you must.Do not let your legs lean against the back of the chair when you stand. Please stand up now." X(2) Normal: Comes to stand without use of hands and stabilizes independently. (1) Moderate: Comes to stand WITH use of hands on first attempt. (0) Severe: Unable to stand up from chair without assistance, OR needs several attempts with use of hands. 2. RISE TO TOES Instruction: "Place your feet shoulder width apart. Place your hands on your hips. Try to rise as high as you can onto your toes. I will count out loud to 3 seconds. Try to hold this pose for at least 3 seconds. Look straight ahead. Rise now." X(2) Normal: Stable for 3 s with maximum height. (1) Moderate: Heels up, but not full range (smaller than when holding hands), OR noticeable instability for 3  s. (0) Severe: < 3 s. 3. STAND ON ONE LEG Instruction: "Look straight ahead. Keep your hands on your hips. Lift your leg off of the ground behind you without touching or resting your raised leg upon your other standing leg. Stay standing on one leg as long as you can. Look straight ahead. Lift now." Left: Time in Seconds Trial 1:_____Trial 2:_____ (2) Normal: 20 s. X(1) Moderate: < 20 s. (0) Severe: Unable. Right: Time in Seconds Trial 1:_____Trial 2:_____ X(2) Normal: 20 s. (1) Moderate: < 20 s. (0) Severe: Unable To score each side separately use the trial with the longest time. To calculate the sub-score and total score use the side [left or right] with the lowest numerical score [i.e. the worse side]. ______________________________________________________________________________________Reactive Postural Control___________Subscore:___5__/6 4. COMPENSATORY STEPPING CORRECTION- FORWARD Instruction: "Stand with your feet shoulder width apart, arms at your sides. Lean forward against my hands beyond your forward limits. When I let go, do whatever is necessary, including taking a step, to avoid a fall." X(2) Normal: Recovers independently with a single, large step (second realignment step is allowed). (1) Moderate: More than one step used to recover equilibrium. (0) Severe: No step, OR would fall if not caught, OR falls spontaneously. 5. COMPENSATORY STEPPING CORRECTION- BACKWARD Instruction: "Stand with your feet shoulder width apart, arms at your sides. Lean backward against my hands beyond your backward limits. When I let go, do whatever is necessary, including taking a step, to avoid a fall." (2) Normal: Recovers independently with a single, large step. X(1) Moderate: More than one step used to recover equilibrium. (0) Severe: No step, OR would fall if not caught, OR falls spontaneously. 6. COMPENSATORY STEPPING CORRECTION- LATERAL Instruction: "Stand with your feet together, arms  down at your sides. Lean into my hand beyond your sideways limit. When I let go, do whatever is necessary, including taking a step, to avoid a fall." Left X(2) Normal: Recovers independently with 1 step (crossover or lateral OK). (1) Moderate: Several steps to recover equilibrium. (0) Severe: Falls, or cannot step. Right X(2) Normal: Recovers independently with 1 step (crossover or lateral OK). (1) Moderate: Several steps to recover equilibrium. (0)  Severe: Falls, or cannot step. Use the side with the lowest score to calculate sub-score and total score. ____________________________________________________________________________________Sensory Orientation_____________Subscore:______6___/6 7. STANCE (FEET TOGETHER); EYES OPEN, FIRM SURFACE Instruction: "Place your hands on your hips. Place your feet together until almost touching. Look straight ahead. Be as stable and still as possible, until I say stop." Time in seconds:________ X(2) Normal: 30 s. (1) Moderate: < 30 s. (0) Severe: Unable. 8. STANCE (FEET TOGETHER); EYES CLOSED, FOAM SURFACE Instruction: "Step onto the foam. Place your hands on your hips. Place your feet together until almost touching. Be as stable and still as possible, until I say stop. I will start timing when you close your eyes." Time in seconds:________ X(2) Normal: 30 s. (1) Moderate: < 30 s. (0) Severe: Unable. 9. INCLINE- EYES CLOSED Instruction: "Step onto the incline ramp. Please stand on the incline ramp with your toes toward the top. Place your feet shoulder width apart and have your arms down at your sides. I will start timing when you close your eyes." Time in seconds:________ X(2) Normal: Stands independently 30 s and aligns with gravity. (1) Moderate: Stands independently <30 s OR aligns with surface. (0) Severe: Unable. _________________________________________________________________________________________Dynamic Gait  ______Subscore_____7___/10 10. CHANGE IN GAIT SPEED Instruction: "Begin walking at your normal speed, when I tell you 'fast', walk as fast as you can. When I say 'slow', walk very slowly." (2) Normal: Significantly changes walking speed without imbalance. X(1) Moderate: Unable to change walking speed or signs of imbalance. (0) Severe: Unable to achieve significant change in walking speed AND signs of imbalance. [place] - HORIZONTAL Instruction: "Begin walking at your normal speed, when I say "right", turn your head and look to the right. When I say "left" turn your head and look to the left. Try to keep yourself walking in a straight line." (2) Normal: performs head turns with no change in gait speed and good balance. X(1) Moderate: performs head turns with reduction in gait speed. (0) Severe: performs head turns with imbalance. 12. WALK WITH PIVOT TURNS Instruction: "Begin walking at your normal speed. When I tell you to 'turn and stop', turn as quickly as you can, face the opposite direction, and stop. After the turn, your feet should be close together." X(2) Normal: Turns with feet close FAST (< 3 steps) with good balance. (1) Moderate: Turns with feet close SLOW (>4 steps) with good balance. (0) Severe: Cannot turn with feet close at any speed without imbalance. 13. STEP OVER OBSTACLES Instruction: "Begin walking at your normal speed. When you get to the box, step over it, not around it and keep walking." (2) Normal: Able to step over box with minimal change of gait speed and with good balance. X(1) Moderate: Steps over box but touches box OR displays cautious behavior by slowing gait. (0) Severe: Unable to step over box OR steps around box. 14. TIMED UP & GO WITH DUAL TASK [3 METER WALK] Instruction TUG: "When I say 'Go', stand up from chair, walk at your normal speed across the tape on the floor, turn around, and come back to sit in the chair." Instruction TUG with  Dual Task: "Count backwards by threes starting at ___. When I say 'Go', stand up from chair, walk at your normal speed across the tape on the floor, turn around, and come back to sit in the chair. Continue counting backwards the entire time." TUG: ________seconds; Dual Task TUG: ________seconds X(2) Normal: No noticeable change in sitting, standing or  walking while backward counting when compared to TUG without Dual Task. (1) Moderate: Dual Task affects either counting OR walking (>10%) when compared to the TUG without Dual Task. (0) Severe: Stops counting while walking OR stops walking while counting. When scoring item 14, if subject's gait speed slows more than 10% between the TUG without and with a Dual Task the score should be decreased by a point. TOTAL SCORE: ____23____/28                         PT Long Term Goals - 10/31/15 1135      PT LONG TERM GOAL #1   Title Pt will demonstrate understanding of and independence with HEP to address Parkinson's specific deficits.  TARGET 11/29/15   Time 4   Period Weeks   Status New     PT LONG TERM GOAL #2   Title Pt will perform at least 8 of 10 reps of sit<>stand transfers from <18" surfaces, without UE support for improved transfer efficiency and safety.   Time 4   Period Weeks   Status New     PT LONG TERM GOAL #3   Title Pt will ambulate at least 600 ft in 3 minute walk test, for improved efficiency with gait.   Time 4   Period Weeks   Status New     PT LONG TERM GOAL #4   Title Pt will verbalize local Parkinson's resources, including ongoing fitness opportunities, upon D/C from PT.   Time 4   Period Weeks   Status New               Plan - 10/31/15 1130    Clinical Impression Statement Pt is a 75 year old female who presents to OP PT with Parkinson's disease for approximately 6 years.  Pt presents to OP PT with bradykinesia, decreased timing and coordination with gait, decreased balance, abnormal  posture.  She also reports difficulty with fine motor coordination and dysgraphia.  Pt enjoys travelling and going to the beach.  Pt will benefit from skilled physical therapy to address the above stated deficits with progressive HEP targeting Parkinson's deficits, for improved functional mobility and decreased fall risk.   Rehab Potential Good   PT Frequency 2x / week   PT Duration 4 weeks   PT Treatment/Interventions ADLs/Self Care Home Management;Functional mobility training;Gait training;Therapeutic activities;Therapeutic exercise;Balance training;Neuromuscular re-education;Patient/family education   PT Next Visit Plan Initiate seated and standing PWR! Moves Exercises as part of HEP.  Discuss walking progam for home.   Recommended Other Services Pt may benefit from OT services based on pt's reports of coordination and handwriting difficulties.   Consulted and Agree with Plan of Care Patient      Patient will benefit from skilled therapeutic intervention in order to improve the following deficits and impairments:  Abnormal gait, Decreased activity tolerance, Decreased mobility, Difficulty walking, Postural dysfunction  Visit Diagnosis: Other abnormalities of gait and mobility  Other symptoms and signs involving the nervous system  Abnormal posture      G-Codes - 11/13/15 1139    Functional Assessment Tool Used No current Parkinson's HEP   Functional Limitation Self care   Self Care Current Status [id number]) At least 20 percent but less than 40 percent impaired, limited or restricted   Self Care Goal Status [id number]) At least 1 percent but less than 20 percent impaired, limited or restricted       Problem List Patient Active Problem  List   Diagnosis Date Noted  . Atypical atrial flutter ([place]) 05/28/2015  . Diastolic dysfunction [id number]  . Ventral hernia 11/24/2013  . Ventral incisional hernia - epigastric 07/21/2013  . Long term (current) use of anticoagulants 07/21/2013  .  Chronic diastolic heart failure ([place]) 12/01/2012  . Heart failure, acute diastolic ([place]) [id number]  . Shortness of breath 11/23/2012  . Paralysis agitans ([place]) 10/26/2012  . 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 Sick sinus syndrome ([place]) 10/20/2012  . Acid reflux 06/09/2012  . HLD (hyperlipidemia) 06/09/2012  . 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 OP (osteoporosis) 06/09/2012  . Hypertension 10/12/2011  . Chest pain 06/03/2011  . Atrial fibrillation, chronic ([place]) 10/31/2010    Hamed Debella W. 10/31/2015, 11:43 AM [facility] [address] [place], [place], [address] Phone: [phone]   Fax:  [phone]  Name: Beily Buscemi MRN: [id number] Date of Birth: 10-10-1940

## 2015-11-02 NOTE — Progress Notes (Signed)
[name] [name] was seen today in the movement disorders clinic for neurologic consultation at the request of [name],[name], MD.  The consultation is for the evaluation of PD.  She has previously seen [name] and [name].  I have some of [name] notes.  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  She had a normal MRI brain at that time according to notes from Dr. love.  In 2011, her diagnosis was changed to Parkinson's disease and Azilect was added.  She had a second opinion at [facility] on 07/25/2009 and [name] also thought that she had Parkinson's disease.  In January, 2014 [name] added Artane, presumably for tremor.  She does think that it has helped the tremor.  02/22/13 update:  The pt is f/u today re: PD.  After our discussion last visit, she decided to stop the artane and states that while she noted a little increase in tremor, it hasn't been bad.  I had wanted to start her on carbidopa/levodopa 25/100 but she wanted to hold on that last vist.  Balance is good but she has felt more slow and stiff.  No falls.  No hallucinations.  No n/v.  No near syncope.  Not exercising faithfully.  06/20/13 update:  The pt is f/u today re: PD.  She was started on carbidopa/levodopa 25/100 last visit in January in addition to the azilect that she was already on.  She just returned from a trip from the [place] and was so surprised that she could do all of the planned excursions.  She felt great that she could do all the hiking and could get in and out of the zodiac boat.  She states that she is so glad she started taking the levodopa and thinks that her mood is better.  Her coordination and tremor are better but she still has some tremor.  Some minor stomach upset.  Pharmacist told her to take it with food and she admits that she is taking it with protein.  She last took levodopa at 7:30 am and was examined at 10am.  She is having difficulty staying asleep.  10/13/13 update:  Pt is on carbidopa/levodopa 25/100  and is supposed to be on one tid but admits that she is only taking 1/2 po bid. Did this because of GI upset.   She is having more tremor especially when walking in the L hand; she is not sure if it is any particular time of day.  It doesn't bother her too much.  Some increased difficulty buttoning clothing.  Has a URI and is leaving for [place] on Friday.  She is going for 3 weeks.    02/13/14 update:  The patient returns today for follow-up.  She reports that she is not moving as fluidly as she previously was.  Last time I told her that she needed to really increase her medication to one tablet 3 times per day but she did not do that and she is only taking one twice a day; interestingly she gets the first and the middle of the day dosage in and forgets the last.  She does get nighttime (leg/toe) cramping.  She did have a ventral hernia repair on 11/24/2013.  States that she had a difficult time recovering as she had a hematoma after.  Could not walk after surgery and has not been back to exercise but knows that she needs to.  No falls.  Little tremor.    06/07/14 update:  The patient is f/u for PD.  She is still on carbidopa/levodopa 25/100 twice per day.  She did better clinically on tid dosing but backed down on it to bid on her own.   She states that "sometimes" she takes the third dose but she often is now missing the middle of the day dose.  Sometimes, she only takes it once a day.   She is walking well; no falls.  She is exercising but states that she just had a cortisone injection into the left knee so that was a little limited.  She is having an ablation for a-fib next tues.  She states that it will be done at [facility] and it is a 3 hr procedure (because it is internal not external).  No lightheadedness.  She is still on azilect faithfully.  She takes klonopin about 1-2 times per week for insomnia.    10/11/14 update:  The patient follows up today for Parkinson's disease.  Last visit, I encouraged her  to go back up on her carbidopa/levodopa from twice per day to 3 times per day as she was more stiff and rigid last visit.  She may or may not do that but finds that most days she takes it bid. She has had some toe curling but is unsure if it is related to timing of meds.   She remains on Azilect as well as clonazepam for her insomnia.  She reports that she sleeps with this but she admits she doesn't take it regularly.  After some discussion today, we realized that she acidentally was taking temazepam instead of the klonopin.    She reports that she had another cardiac ablation since last visit and thought that it originally didn't work but now she thinks that it is.    02/14/15 update:  The patient follows up today.  She is supposed to be on carbidopa/levodopa 25/100, 3 times a day, but she generally takes it twice a day.  She is on Azilect.  Since our last visit, she has started amiodarone.  She denies falls.  She denies hallucinations.  She denies lightheadedness or near syncope.  Her husband thinks that her voice isn't as good.  She has had some SOB and wonders if it isn't from the amiodarone (but thinks that she recently had some altitidue sickness as well skiing in the mountains); she is f/u with Dr. Rayann Heman and is to have a CXR.  She does think that SOB is getting better.  She has not been able to exercise as much because of the SOB.  [place] have moved to the new villas [place] [facility] [place].  She is having some trouble sleeping even with klonopin.    06/14/15 update:  The patient is following up today.  I have reviewed records since previous visit.  Last visit, encouraged her to take carbidopa/levodopa 25/100, 3 times per day, as she was often only taking it twice per day, with the second dosage at bedtime.  States that she still has trouble remembering to take it and she often only takes it once a day.   She remains on Azilect.  She is also on clonazepam for insomnia but is not using it faithfully because she  doesn't think that it helps.  Mood has been okay, but admits that it goes up and down depending on how she is doing medically.  Because atrial arrhythmias, she was started on amiodarone in November, 2016.  This was increased on 05/28/2015 to 200 mg twice  a day but it was decreased back to once per day yesterday.  Pt states that she has been so SOB and she was told that it was likely from the amiodarone.  Because of SOB, she hasn't been able to exercise.  She has had 3 falls.  States that with one she was walking and talking to someone behind her and tripped over a curb.  With one, she fell when an elevator door closed on her.  With one fall, her shoe came off and she tripped.  No hallucinations.    11/05/15 update:  The patient follows up today for her Parkinson's disease.  She has been noncompliant with levodopa dosing, and I again encouraged her last visit to take carbidopa/levodopa 25/100, one tablet 3 times per day.  She isn't doing this and is only taking one per day.   She states that she is doing well, however.  She had 2 falls since our last visit but states that during her PT screen she did well and wasn't considered a fall risk.  With the falls, she was outside on an uneven surface and just tripped.  She is on Azilect, 1 mg daily.  Remains on amiodarone (but had dx of PD prior to its use) but dose reduced at end of Aug by Dr. Rayann Heman and if she continues to do well, plans are to d/c that medication.  I reviewed cardiology records.  No falls since last visit.  Had a pleural effusion and subsequent thoracentesis.  No malignant cells on cytology.  States that she just had another xray with Dr. Joylene Draft.  Sleeping better than she was previously  PREVIOUS MEDICATIONS: Azilect and artane  ALLERGIES:   Allergies  Allergen Reactions  . Statins     Leg weakness - but tolerating low dose Crestor every other day    CURRENT MEDICATIONS:  Current Outpatient Prescriptions on File Prior to Visit  Medication  Sig Dispense Refill  . acetaminophen (TYLENOL) 500 MG tablet Take 1,000 mg by mouth every 4 (four) hours as needed for mild pain or headache.    [name] amiodarone (PACERONE) 200 MG tablet Take 0.5 tablets (100 mg total) by mouth daily. 60 tablet 10  . AZILECT 1 MG TABS tablet TAKE 1 TABLET (1 MG TOTAL) BY MOUTH DAILY.  3  . CALCIUM PO Take 1 tablet by mouth every morning.     . Calcium Polycarbophil (FIBERCON PO) Take 2 tablets by mouth every morning.     . carbidopa-levodopa (SINEMET IR) 25-100 MG tablet Take 1 tablet by mouth 3 (three) times daily. 90 tablet 5  . clonazePAM (KLONOPIN) 0.5 MG tablet Take 1 tablet (0.5 mg total) by mouth at bedtime. 30 tablet 5  . Coenzyme Q10 (CO Q 10 PO) Take 1 tablet by mouth every morning.     Mariane Baumgarten Sodium (COLACE PO) Take 1 capsule by mouth 2 (two) times daily.     [name] ELIQUIS 5 MG TABS tablet Take 5 mg by mouth 2 (two) times daily.     [name] ezetimibe (ZETIA) 10 MG tablet Take 10 mg by mouth every other day. Taking the same day as the crestor    . furosemide (LASIX) 40 MG tablet Take 40 mg by mouth in the AM and 20 mg (1/2 tablet) by mouth in the PM 135 tablet 3  . levothyroxine (SYNTHROID, LEVOTHROID) 100 MCG tablet Take 100 mcg by mouth daily.  2  . MAGNESIUM PO Take 1 tablet by mouth at bedtime.     [name]  metoprolol succinate (TOPROL-XL) 25 MG 24 hr tablet Take 1 tablet (25 mg total) by mouth daily. 45 tablet 3  . multivitamin-lutein (OCUVITE-LUTEIN) CAPS capsule Take 1 capsule by mouth daily.    . potassium chloride (K-DUR,KLOR-CON) 10 MEQ tablet Take 10 mEq by mouth every morning.    . raloxifene (EVISTA) 60 MG tablet Take 60 mg by mouth daily.  3  . rosuvastatin (CRESTOR) 5 MG tablet Take 2.5 mg by mouth every other day.    . valACYclovir (VALTREX) 500 MG tablet Take 500 mg by mouth daily as needed (for fever blisters).     [name] VITAMIN D, ERGOCALCIFEROL, PO Take 1,000 Units by mouth daily.      No current facility-administered medications on file prior to  visit.     PAST MEDICAL HISTORY:   Past Medical History:  Diagnosis Date  . Arthritis    thumbs  . Atrial tachycardia ([place])   . Atypical atrial flutter ([place])   . Cataract    bilateral  . Chronic anticoagulation    on Xarelto  . Chronic diastolic CHF (congestive heart failure) (HCC)    Echocardiogram (11/24/12): Mild LVH, EF 60%.  [name] GERD (gastroesophageal reflux disease)   . Heart murmur    slight  . Hyperlipidemia    a. Hx of leg weakness while on statin. under control  . Hypertension   . Hypothyroidism   . Leg fracture Dec 21,2011  . Osteoporosis 07/2007  . Parkinson's disease ([place]) 2011  . Persistent atrial fibrillation (HCC)    s/p atrial fib ablation 11-11-11.   [name] Shortness of breath    "when walking at incline or stairs"  . Sinus brady-tachy syndrome ([place])   . Squamous carcinoma summer 2015   back of left thigh  . Wrist fracture    right    PAST SURGICAL HISTORY:   Past Surgical History:  Procedure Laterality Date  . ATRIAL FIBRILLATION ABLATION N/A 11/11/2011   Procedure: ATRIAL FIBRILLATION ABLATION;  Surgeon: Thompson Grayer, MD;  Location: [facility] CATH LAB;  Service: Cardiovascular;  Laterality: N/A;  . BREAST SURGERY     benign cyst removed  . CARDIAC ELECTROPHYSIOLOGY STUDY AND ABLATION  5/14, 7/14   convergent AF ablation at [facility] and subsequent atypical atrial flutter ablation by Dr Lehman Prom  . CARDIOVASCULAR STRESS TEST  06-06-2008   EF 73%  . CARDIOVERSION  09/09/2011   Procedure: CARDIOVERSION;  Surgeon: Darlin Coco, MD;  Location: [facility] ENDOSCOPY;  Service: Cardiovascular;  Laterality: N/A;  to be done by dr. Mare Ferrari  . CARDIOVERSION  02/18/2012   Procedure: CARDIOVERSION;  Surgeon: Thayer Headings, MD;  Location: [place];  Service: Cardiovascular;  Laterality: N/A;  . CHOLECYSTECTOMY  1995  . FEMUR FRACTURE SURGERY  2011   metal pin in place  . FOREARM SURGERY Left 2010   "opened arm to drain it"  . INSERTION OF MESH N/A 11/24/2013   Procedure:  INSERTION OF MESH;  Surgeon: Michael Boston, MD;  Location: [facility];  Service: General;  Laterality: N/A;  . KNEE SURGERY Left 2001  . PACEMAKER PLACEMENT  10/20/12   MDT Advisa DR implanted by Dr Lehman Prom at [facility]   . SKIN CANCER DESTRUCTION  summer 2015  . TEE WITHOUT CARDIOVERSION  11/10/2011   Procedure: TRANSESOPHAGEAL ECHOCARDIOGRAM (TEE);  Surgeon: Thayer Headings, MD;  Location: [place];  Service: Cardiovascular;  Laterality: N/A;  . TONSILLECTOMY  as child  . US ECHOCARDIOGRAPHY  03-10-2008   Est EF 55-60%, Dr. Cathie Olden  .  VENTRAL HERNIA REPAIR N/A 11/24/2013   Procedure: LAPAROSCOPIC VENTRAL HERNIA;  Surgeon: Michael Boston, MD;  Location: [facility];  Service: General;  Laterality: N/A;  . WRIST SURGERY Right ~2009   metal rod in place    SOCIAL HISTORY:   Social History   Social History  . Marital status: Married    Spouse name: N/A  . Number of children: N/A  . Years of education: N/A   Occupational History  . Not on file.   Social History Main Topics  . Smoking status: Never Smoker  . Smokeless tobacco: Never Used  . Alcohol use Yes     Comment: 1-2 glasses of wine daily  . Drug use: No  . Sexual activity: Yes    Partners: Male    Birth control/ protection: Post-menopausal   Other Topics Concern  . Not on file   Social History Narrative   Lives in [place].  Retired Licensed conveyancer.    FAMILY HISTORY:   Family Status  Relation Status  . Mother Deceased at age 33   AD   . Father Deceased at age 37   Valve-rheumatic fever as child  . Child Alive   healthy  . Child Alive   healthy    ROS:  A complete 10 system review of systems was obtained and was unremarkable apart from what is mentioned above.  PHYSICAL EXAMINATION:    VITALS:   Vitals:   11/05/15 0832  BP: 110/60  Pulse: 75  Weight: 136 lb (61.7 kg)  Height: 5\' 2"  (1.575 m)    GEN:  The patient appears stated age and is in NAD. HEENT:  Normocephalic, atraumatic.  The mucous membranes are moist.  The superficial temporal arteries are without ropiness or tenderness. CV:  Regular rate rhythm Lungs:  CTAB  Neurological examination:  Orientation: The patient is alert and oriented x3. Fund of knowledge is appropriate.     Movement examination: Tone: There is mild increased tone in the RUE Abnormal movements:   There is intermittent tremor of the RLE.   When she ambulates, she has a tremor of the bilateral upper extremity.  She has dyskinesia in the L leg when distraction Coordination:  There is mild decremation with finger taps and hand opening and closing on the right mostly.   Gait and Station: The patient has no difficulty arising out of a deep-seated chair without the use of the hands. The patient's stride length is mildly decreased.  Tremor increases with ambulation in the LUE.  The patient has a negative pull test.      ASSESSMENT/PLAN:  1.  idiopathic tremor predominant Parkinson's disease  -Encouraged her again to take carbidopa/levodopa 25/100, one tablet 3 times per day.  She is only taking it once per day and talked to her about why she should take it tid.  She is a little dyskinetic today but had just taken her only pill she will take today   -She will remain on the Azilect.  -encouraged exercise.  Has not been engaging in this lately.  States that she has been "on and off" with exercise.  -On amiodarone, which certainly can contribute to tremor and more rarely, can cause parkinsonism.  Cardiology is going to try and wean off again 2.  Insomnia.  -She has klonopin but doesn't take it faithfully as she doesn't think that it works.  Offered to try remeron but she states that she is doing better. 3.  Follow up in the next few months,  sooner should new neurologic issues arise.   Much greater than 50% of this visit was spent in counseling with the patient and the family.  Total face to face time:  25 min

## 2015-11-05 ENCOUNTER — Ambulatory Visit (INDEPENDENT_AMBULATORY_CARE_PROVIDER_SITE_OTHER): Payer: Medicare Other | Admitting: Neurology

## 2015-11-05 ENCOUNTER — Encounter: Payer: Self-pay | Admitting: Neurology

## 2015-11-05 VITALS — BP 110/60 | HR 75 | Ht 62.0 in | Wt 136.0 lb

## 2015-11-05 DIAGNOSIS — G20 Parkinson's disease: Secondary | ICD-10-CM

## 2015-11-06 ENCOUNTER — Ambulatory Visit: Payer: Medicare Other | Attending: Neurology | Admitting: Physical Therapy

## 2015-11-06 DIAGNOSIS — R2689 Other abnormalities of gait and mobility: Secondary | ICD-10-CM | POA: Diagnosis not present

## 2015-11-06 DIAGNOSIS — R293 Abnormal posture: Secondary | ICD-10-CM

## 2015-11-06 DIAGNOSIS — R29818 Other symptoms and signs involving the nervous system: Secondary | ICD-10-CM

## 2015-11-06 NOTE — Therapy (Signed)
[facility] [address] [place], [place], [address] Phone: [phone]   Fax:  [phone]  Physical Therapy Treatment  Patient Details  Name: [name] [name] MRN: [id number] Date of Birth: 1940/06/26 Referring Provider: Tat  Encounter Date: 11/06/2015      PT End of Session - 11/06/15 1139    Visit Number 2   Number of Visits 9   Date for PT Re-Evaluation 11/29/15   Authorization Type Medicare; [employer] secondary-GCODE every 10th visit   PT Start Time 1020   PT Stop Time 1100   PT Time Calculation (min) 40 min   Activity Tolerance Patient tolerated treatment well   Behavior During Therapy [facility] for tasks assessed/performed      Past Medical History:  Diagnosis Date  . Arthritis    thumbs  . Atrial tachycardia ([place])   . Atypical atrial flutter ([place])   . Cataract    bilateral  . Chronic anticoagulation    on Xarelto  . Chronic diastolic CHF (congestive heart failure) (HCC)    Echocardiogram (11/24/12): Mild LVH, EF 60%.  [name] GERD (gastroesophageal reflux disease)   . Heart murmur    slight  . Hyperlipidemia    a. Hx of leg weakness while on statin. under control  . Hypertension   . Hypothyroidism   . Leg fracture Dec 21,2011  . Osteoporosis 07/2007  . Parkinson's disease ([place]) 2011  . Persistent atrial fibrillation (HCC)    s/p atrial fib ablation 11-11-11.   [name] Shortness of breath    "when walking at incline or stairs"  . Sinus brady-tachy syndrome ([place])   . Squamous carcinoma summer 2015   back of left thigh  . Wrist fracture    right    Past Surgical History:  Procedure Laterality Date  . ATRIAL FIBRILLATION ABLATION N/A 11/11/2011   Procedure: ATRIAL FIBRILLATION ABLATION;  Surgeon: Thompson Grayer, MD;  Location: [facility] CATH LAB;  Service: Cardiovascular;  Laterality: N/A;  . BREAST SURGERY     benign cyst removed  . CARDIAC ELECTROPHYSIOLOGY STUDY AND ABLATION  5/14, 7/14   convergent AF ablation at  [facility] and subsequent atypical atrial flutter ablation by Dr Lehman Prom  . CARDIOVASCULAR STRESS TEST  06-06-2008   EF 73%  . CARDIOVERSION  09/09/2011   Procedure: CARDIOVERSION;  Surgeon: Darlin Coco, MD;  Location: [facility] ENDOSCOPY;  Service: Cardiovascular;  Laterality: N/A;  to be done by dr. Mare Ferrari  . CARDIOVERSION  02/18/2012   Procedure: CARDIOVERSION;  Surgeon: Thayer Headings, MD;  Location: [facility];  Service: Cardiovascular;  Laterality: N/A;  . CHOLECYSTECTOMY  1995  . FEMUR FRACTURE SURGERY  2011   metal pin in place  . FOREARM SURGERY Left 2010   "opened arm to drain it"  . INSERTION OF MESH N/A 11/24/2013   Procedure: INSERTION OF MESH;  Surgeon: Michael Boston, MD;  Location: [facility] ORS;  Service: General;  Laterality: N/A;  . KNEE SURGERY Left 2001  . PACEMAKER PLACEMENT  10/20/12   MDT Advisa DR implanted by Dr Lehman Prom at [facility]   . SKIN CANCER DESTRUCTION  summer 2015  . TEE WITHOUT CARDIOVERSION  11/10/2011   Procedure: TRANSESOPHAGEAL ECHOCARDIOGRAM (TEE);  Surgeon: Thayer Headings, MD;  Location: [facility];  Service: Cardiovascular;  Laterality: N/A;  . TONSILLECTOMY  as child  . US ECHOCARDIOGRAPHY  03-10-2008   Est EF 55-60%, Dr. Cathie Olden  . VENTRAL HERNIA REPAIR N/A 11/24/2013   Procedure: LAPAROSCOPIC VENTRAL HERNIA;  Surgeon: Michael Boston, MD;  Location: [facility] ORS;  Service: General;  Laterality: N/A;  . WRIST SURGERY Right ~2009   metal rod in place    There were no vitals filed for this visit.      Subjective Assessment - 11/06/15 1021    Subjective Went to see Dr. Carles Collet yesterday, and she was pleased.  I'm sleepy this morning.   Patient Stated Goals Pt's goal for therapy is to help Parkinson's to not get worse.   Multiple Pain Sites No                            PWR [facility]) - 11/06/15 1021    PWR! exercises Moves in sitting;Moves in standing   PWR! Up x 10   PWR! Rock x 10 total (5 reps on each side)   PWR! Twist x 10 total (5 reps on each  side)   PWR Step x 10 total (5 reps on each side)   Comments-PWR! Moves in sitting Verbal, visual cues provided for technique and intensity of movement.  Provided explanation of each PWR! Move, including how it related to functional activities.   PWR! Up x 10   PWR! Rock x 5   PWR! Twist x 10   PWR! Step x 5   Comments-PWR! Hands Discussed and practiced PWR! Hands because of pt's c/o small handwriting and difficulty with fine motor tasks, such as buttoning.  (PT recommends OT order for evaluation, but pt declines at this time.)   PWR! Up x 10 reps   PWR! Rock x 10 reps total (5 reps on each side)   PWR! Twist x 10 total (5 reps on each side)   PWR! Step x 10 total (5 reps on each side)   Comments-PWR! Moves in standing Provided visual and verbal cues for technique and intensity.  Provided explanations of how each PWR! Move relates to functional activities.             PT Education - 11/06/15 1138    Education provided Yes   Education Details HEP-see instructions   Person(s) Educated Patient   Methods Explanation;Demonstration;Handout   Comprehension Verbalized understanding;Returned demonstration;Verbal cues required             PT Long Term Goals - 10/31/15 1135      PT LONG TERM GOAL #1   Title Pt will demonstrate understanding of and independence with HEP to address Parkinson's specific deficits.  TARGET 11/29/15   Time 4   Period Weeks   Status New     PT LONG TERM GOAL #2   Title Pt will perform at least 8 of 10 reps of sit<>stand transfers from <18" surfaces, without UE support for improved transfer efficiency and safety.   Time 4   Period Weeks   Status New     PT LONG TERM GOAL #3   Title Pt will ambulate at least 600 ft in 3 minute walk test, for improved efficiency with gait.   Time 4   Period Weeks   Status New     PT LONG TERM GOAL #4   Title Pt will verbalize local Parkinson's resources, including ongoing fitness opportunities, upon D/C from PT.    Time 4   Period Weeks   Status New               Plan - 11/06/15 1146    Clinical Impression Statement Treatment session focused today on initiating Parkinson's specific HEP, including  PWR! Moves sitting and standing, also incorporating PWR! Hands.  Pt declines OT referral at this time.  Pt able to follow verbal and visual cues for good technique and intensity of PWR! Moves.  Pt will continue to benefit from further skilled PT to address functional mobility, posture and transfers.   Rehab Potential Good   PT Frequency 2x / week   PT Duration 4 weeks   PT Treatment/Interventions ADLs/Self Care Home Management;Functional mobility training;Gait training;Therapeutic activities;Therapeutic exercise;Balance training;Neuromuscular re-education;Patient/family education   PT Next Visit Plan Review seated and standing PWR! Moves Exercises as part of HEP; progress PWR! Hands, sit<>stand.  Discuss walking progam for home.   Consulted and Agree with Plan of Care Patient      Patient will benefit from skilled therapeutic intervention in order to improve the following deficits and impairments:  Abnormal gait, Decreased activity tolerance, Decreased mobility, Difficulty walking, Postural dysfunction  Visit Diagnosis: Abnormal posture  Other symptoms and signs involving the nervous system     Problem List Patient Active Problem List   Diagnosis Date Noted  . Atypical atrial flutter ([place]) 05/28/2015  . Diastolic dysfunction [id number]  . Ventral hernia 11/24/2013  . Ventral incisional hernia - epigastric 07/21/2013  . Long term (current) use of anticoagulants 07/21/2013  . Chronic diastolic heart failure ([place]) 12/01/2012  . Heart failure, acute diastolic ([place]) [id number]  . Shortness of breath 11/23/2012  . Paralysis agitans ([place]) 10/26/2012  . 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 Sick sinus syndrome ([place]) 10/20/2012  . Acid reflux 06/09/2012  . HLD (hyperlipidemia) 06/09/2012   . Idiopathic Parkinson's disease ([place]) 06/09/2012  . OP (osteoporosis) 06/09/2012  . Hypertension 10/12/2011  . Chest pain 06/03/2011  . Atrial fibrillation, chronic ([place]) 10/31/2010    MARRIOTT,AMY W. 11/06/2015, 11:49 AM  Frazier Butt., PT  [facility] [address] [place], [place], [address] Phone: [phone]   Fax:  [phone]  Name: [name] [name] MRN: [id number] Date of Birth: 1940/06/14

## 2015-11-06 NOTE — Patient Instructions (Signed)
Provided handouts for PWR! Moves in sitting and standing  -PWR! Up x 20 -PWR! Rock x 20 -PWR! Twist x 20 -PWR! Step x 20  Provided handouts for PWR! Hands -PWR! Up flicks x 10 -PWR! Step-finger steps with wide open hands x 5 reps  To be performed once per day.

## 2015-11-07 ENCOUNTER — Ambulatory Visit (HOSPITAL_COMMUNITY)
Admission: RE | Admit: 2015-11-07 | Discharge: 2015-11-07 | Disposition: A | Discharge status: Home / Self Care | Payer: Medicare Other | Source: Ambulatory Visit | Attending: Gastroenterology | Admitting: Gastroenterology

## 2015-11-07 ENCOUNTER — Other Ambulatory Visit: Payer: Self-pay | Admitting: Neurology

## 2015-11-07 DIAGNOSIS — R932 Abnormal findings on diagnostic imaging of liver and biliary tract: Secondary | ICD-10-CM | POA: Diagnosis not present

## 2015-11-07 DIAGNOSIS — J90 Pleural effusion, not elsewhere classified: Secondary | ICD-10-CM | POA: Diagnosis not present

## 2015-11-07 DIAGNOSIS — K760 Fatty (change of) liver, not elsewhere classified: Secondary | ICD-10-CM

## 2015-11-07 DIAGNOSIS — J9811 Atelectasis: Secondary | ICD-10-CM | POA: Diagnosis not present

## 2015-11-07 DIAGNOSIS — K740 Hepatic fibrosis: Secondary | ICD-10-CM | POA: Diagnosis not present

## 2015-11-07 DIAGNOSIS — Z9889 Other specified postprocedural states: Secondary | ICD-10-CM | POA: Diagnosis not present

## 2015-11-08 ENCOUNTER — Ambulatory Visit: Payer: Medicare Other | Admitting: Physical Therapy

## 2015-11-08 DIAGNOSIS — R2689 Other abnormalities of gait and mobility: Secondary | ICD-10-CM

## 2015-11-08 DIAGNOSIS — R293 Abnormal posture: Secondary | ICD-10-CM | POA: Diagnosis not present

## 2015-11-08 DIAGNOSIS — R29818 Other symptoms and signs involving the nervous system: Secondary | ICD-10-CM | POA: Diagnosis not present

## 2015-11-08 DIAGNOSIS — J90 Pleural effusion, not elsewhere classified: Secondary | ICD-10-CM | POA: Diagnosis not present

## 2015-11-08 DIAGNOSIS — M810 Age-related osteoporosis without current pathological fracture: Secondary | ICD-10-CM | POA: Diagnosis not present

## 2015-11-08 DIAGNOSIS — Z6824 Body mass index (BMI) 24.0-24.9, adult: Secondary | ICD-10-CM | POA: Diagnosis not present

## 2015-11-08 DIAGNOSIS — I10 Essential (primary) hypertension: Secondary | ICD-10-CM | POA: Diagnosis not present

## 2015-11-08 DIAGNOSIS — Z950 Presence of cardiac pacemaker: Secondary | ICD-10-CM | POA: Diagnosis not present

## 2015-11-08 DIAGNOSIS — I480 Paroxysmal atrial fibrillation: Secondary | ICD-10-CM | POA: Diagnosis not present

## 2015-11-08 DIAGNOSIS — G20 Parkinson's disease: Secondary | ICD-10-CM | POA: Diagnosis not present

## 2015-11-08 DIAGNOSIS — I509 Heart failure, unspecified: Secondary | ICD-10-CM | POA: Diagnosis not present

## 2015-11-08 DIAGNOSIS — K7460 Unspecified cirrhosis of liver: Secondary | ICD-10-CM | POA: Diagnosis not present

## 2015-11-08 NOTE — Patient Instructions (Signed)
Provided patient with handout on forward step and weightshift x 20 reps (10 reps on each side), then back step and weigthshift x 20 reps (10 on each side)  -to be performed 1-2 times per day along with PWR! Moves standing exercises

## 2015-11-08 NOTE — Therapy (Signed)
[facility] [address] [place], [place], [address] Phone: [phone]   Fax:  [phone]  Physical Therapy Treatment  Patient Details  Name: [name] [name] MRN: [id number] Date of Birth: [birth year] Referring Provider: Tat  Encounter Date: 11/08/2015      PT End of Session - 11/08/15 1436    Visit Number 3   Number of Visits 9   Date for PT Re-Evaluation 11/29/15   Authorization Type Medicare; [employer] secondary-GCODE every 10th visit   PT Start Time 1019   PT Stop Time 1100   PT Time Calculation (min) 41 min   Activity Tolerance Patient tolerated treatment well   Behavior During Therapy [facility] for tasks assessed/performed      Past Medical History:  Diagnosis Date  . Arthritis    thumbs  . Atrial tachycardia ([place])   . Atypical atrial flutter ([place])   . Cataract    bilateral  . Chronic anticoagulation    on Xarelto  . Chronic diastolic CHF (congestive heart failure) (HCC)    Echocardiogram (11/24/12): Mild LVH, EF 60%.  [name] GERD (gastroesophageal reflux disease)   . Heart murmur    slight  . Hyperlipidemia    a. Hx of leg weakness while on statin. under control  . Hypertension   . Hypothyroidism   . Leg fracture Dec 21,2011  . Osteoporosis 07/2007  . Parkinson's disease ([place]) 2011  . Persistent atrial fibrillation (HCC)    s/p atrial fib ablation 11-11-11.   [name] Shortness of breath    "when walking at incline or stairs"  . Sinus brady-tachy syndrome ([place])   . Squamous carcinoma summer 2015   back of left thigh  . Wrist fracture    right    Past Surgical History:  Procedure Laterality Date  . ATRIAL FIBRILLATION ABLATION N/A 11/11/2011   Procedure: ATRIAL FIBRILLATION ABLATION;  Surgeon: Thompson Grayer, MD;  Location: [facility] CATH LAB;  Service: Cardiovascular;  Laterality: N/A;  . BREAST SURGERY     benign cyst removed  . CARDIAC ELECTROPHYSIOLOGY STUDY AND ABLATION  5/14, 7/14   convergent AF ablation at  [facility] and subsequent atypical atrial flutter ablation by Dr Lehman Prom  . CARDIOVASCULAR STRESS TEST  06-06-2008   EF 73%  . CARDIOVERSION  09/09/2011   Procedure: CARDIOVERSION;  Surgeon: Darlin Coco, MD;  [facility] ENDOSCOPY;  Service: Cardiovascular;  Laterality: N/A;  to be done by dr. Mare Ferrari  . CARDIOVERSION  02/18/2012   Procedure: CARDIOVERSION;  Surgeon: Thayer Headings, MD;  Location: [facility];  Service: Cardiovascular;  Laterality: N/A;  . CHOLECYSTECTOMY  1995  . FEMUR FRACTURE SURGERY  2011   metal pin in place  . FOREARM SURGERY Left 2010   "opened arm to drain it"  . INSERTION OF MESH N/A 11/24/2013   Procedure: INSERTION OF MESH;  Surgeon: Michael Boston, MD;  [facility];  Service: General;  Laterality: N/A;  . KNEE SURGERY Left 2001  . PACEMAKER PLACEMENT  10/20/12   MDT Advisa DR implanted by Dr Lehman Prom [facility]   . SKIN CANCER DESTRUCTION  summer 2015  . TEE WITHOUT CARDIOVERSION  11/10/2011   Procedure: TRANSESOPHAGEAL ECHOCARDIOGRAM (TEE);  Surgeon: Thayer Headings, MD;  Location: [facility];  Service: Cardiovascular;  Laterality: N/A;  . TONSILLECTOMY  as child  . US ECHOCARDIOGRAPHY  03-10-2008   Est EF 55-60%, Dr. Cathie Olden  . VENTRAL HERNIA REPAIR N/A 11/24/2013   Procedure: LAPAROSCOPIC VENTRAL HERNIA;  Surgeon: Michael Boston, MD;  [facility]: [facility];  Service: General;  Laterality: N/A;  . WRIST SURGERY Right ~2009   metal rod in place    There were no vitals filed for this visit.      Subjective Assessment - 11/08/15 1020    Subjective Didn't do my exercises yesterday, as I had an appointment and busy day.   Patient Stated Goals Pt's goal for therapy is to help Parkinson's to not get worse.   Currently in Pain? No/denies                         [facility] Adult PT Treatment/Exercise - 11/08/15 0001      Ambulation/Gait   Ambulation/Gait Yes   Ambulation/Gait Assistance 7: Independent   Ambulation Distance (Feet) 350 Feet   Assistive  device None   Gait Pattern Decreased arm swing - right;Decreased arm swing - left;Step-through pattern;Decreased step length - right;Decreased step length - left;Decreased stance time - right;Decreased trunk rotation  No episodes of RLE foot catching    Ambulation Surface Level;Indoor   Gait Comments Cues for upright posture, arm swing, increased step length with walking activities at home.  Discussed walking around the home, walking on level surfaces at home for 3-5 minutes in addition to treadmill walking that patient is already doing.     Neuro Re-ed    Neuro Re-ed Details  Postural exercises standing at doorframe with scapular squeezes x 10 reps, then standing posture "tall as the wall" x 60 seconds, with cues for increased awaress of improved posture.     Exercises   Exercises --           PWR [facility]) - 11/08/15 1023    PWR! exercises Moves in sitting;Moves in standing   PWR! Up x 20    PWR! Rock x 20 reps total (10 reps each side)   PWR! Twist x 20 reps total (10 reps each side)   PWR Step At counter-20 reps total (10 reps each side)  Forward step x 20, back step x 20 at counter   Comments Review of standing PWR! Moves-pt return demo understanding   PWR! Up x 20 reps   PWR! Rock x 20 reps total (10 reps each side)   PWR! Twist x 20 reps total (10 reps each side)   PWR! Step x 20 total (10 reps each side)   Comments Reviewed HEP for sitting PWR! provided last visit-pt return demo understanding             PT Education - 11/08/15 1435    Education provided Yes   Education Details HEP-additions of forward step and weightshift, back step and weightshift   Person(s) Educated Patient   Methods Explanation;Demonstration;Handout   Comprehension Verbalized understanding;Returned demonstration;Verbal cues required;Need further instruction             PT Long Term Goals - 10/31/15 1135      PT LONG TERM GOAL #1   Title Pt will demonstrate understanding of and  independence with HEP to address Parkinson's specific deficits.  TARGET 11/29/15   Time 4   Period Weeks   Status New     PT LONG TERM GOAL #2   Title Pt will perform at least 8 of 10 reps of sit<>stand transfers from <18" surfaces, without UE support for improved transfer efficiency and safety.   Time 4   Period Weeks   Status New     PT LONG TERM GOAL #3  Title Pt will ambulate at least 600 ft in 3 minute walk test, for improved efficiency with gait.   Time 4   Period Weeks   Status New     PT LONG TERM GOAL #4   Title Pt will verbalize local Parkinson's resources, including ongoing fitness opportunities, upon D/C from PT.   Time 4   Period Weeks   Status New               Plan - 11/08/15 1436    Clinical Impression Statement Pt return demonstrates understanding of HEP for sitting and standing PWR! Moves.  Pt does continue to demonstrate decreased awareness of forward flexed posture during sitting and standing activities.  Pt will continue to benefit from skilled physical therapy to address posture, balance and gait activities.   Rehab Potential Good   PT Frequency 2x / week   PT Duration 4 weeks   PT Treatment/Interventions ADLs/Self Care Home Management;Functional mobility training;Gait training;Therapeutic activities;Therapeutic exercise;Balance training;Neuromuscular re-education;Patient/family education   PT Next Visit Plan Review forward and back step and weightshift activities; progress PWR! Moves in standing (FLOW, compliant surface)   Consulted and Agree with Plan of Care Patient      Patient will benefit from skilled therapeutic intervention in order to improve the following deficits and impairments:  Abnormal gait, Decreased activity tolerance, Decreased mobility, Difficulty walking, Postural dysfunction  Visit Diagnosis: Abnormal posture  Other symptoms and signs involving the nervous system  Other abnormalities of gait and mobility     Problem  List Patient Active Problem List   Diagnosis Date Noted  . Atypical atrial flutter ([place]) 05/28/2015  . Diastolic dysfunction Q000111Q  . Ventral hernia 11/24/2013  . Ventral incisional hernia - epigastric 07/21/2013  . Long term (current) use of anticoagulants 07/21/2013  . Chronic diastolic heart failure ([place]) 12/01/2012  . Heart failure, acute diastolic ([place]) 123456  . Shortness of breath 11/23/2012  . Paralysis agitans ([place]) 10/26/2012  . 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 Sick sinus syndrome ([place]) 10/20/2012  . Acid reflux 06/09/2012  . HLD (hyperlipidemia) 06/09/2012  . 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 OP (osteoporosis) 06/09/2012  . Hypertension 10/12/2011  . Chest pain 06/03/2011  . Atrial fibrillation, chronic ([place]) 10/31/2010    [name] Herber W. 11/08/2015, 2:41 PM  [name] Butt., PT  [facility] [address] [place], [place], [address] Phone: [phone]   Fax:  [phone]  Name: [name] [name] MRN: [id number] Date of Birth: [birth year]

## 2015-11-12 ENCOUNTER — Ambulatory Visit: Payer: Medicare Other | Admitting: Physical Therapy

## 2015-11-12 DIAGNOSIS — R293 Abnormal posture: Secondary | ICD-10-CM | POA: Diagnosis not present

## 2015-11-12 DIAGNOSIS — R29818 Other symptoms and signs involving the nervous system: Secondary | ICD-10-CM | POA: Diagnosis not present

## 2015-11-12 DIAGNOSIS — R2689 Other abnormalities of gait and mobility: Secondary | ICD-10-CM

## 2015-11-12 DIAGNOSIS — K760 Fatty (change of) liver, not elsewhere classified: Secondary | ICD-10-CM | POA: Diagnosis not present

## 2015-11-12 NOTE — Patient Instructions (Addendum)
Posture exercises Standing at the door frame: Performed 11/12/15 Stand with back against the door frame, feet hip width apart. 1. Head retraction- keep chin level and pull head straight back to frame. 2. Shoulder blade squeezes 3. Alteranate Arm raises- Raise arm up towards door frame Held each for 5 seconds, 5x each  Walk 3-11min inside or on driveway most days

## 2015-11-12 NOTE — Therapy (Signed)
[facility] [address] [place], [place], [address] Phone: [phone]   Fax:  [phone]  Physical Therapy Treatment  Patient Details  Name: [name] [name] MRN: [id number] Date of Birth: 26-Nov-1940 Referring Provider: Tat  Encounter Date: 11/12/2015      PT End of Session - 11/12/15 1447    Visit Number 4   Number of Visits 9   Date for PT Re-Evaluation 11/29/15   Authorization Type Medicare; [employer] secondary-GCODE every 10th visit   PT Start Time 1405   PT Stop Time 1445   PT Time Calculation (min) 40 min   Equipment Utilized During Treatment Gait belt   Activity Tolerance Patient tolerated treatment well   Behavior During Therapy [facility] for tasks assessed/performed      Past Medical History:  Diagnosis Date  . Arthritis    thumbs  . Atrial tachycardia ([place])   . Atypical atrial flutter ([place])   . Cataract    bilateral  . Chronic anticoagulation    on Xarelto  . Chronic diastolic CHF (congestive heart failure) (HCC)    Echocardiogram (11/24/12): Mild LVH, EF 60%.  [name] GERD (gastroesophageal reflux disease)   . Heart murmur    slight  . Hyperlipidemia    a. Hx of leg weakness while on statin. under control  . Hypertension   . Hypothyroidism   . Leg fracture Dec 21,2011  . Osteoporosis 07/2007  . Parkinson's disease ([place]) 2011  . Persistent atrial fibrillation (HCC)    s/p atrial fib ablation 11-11-11.   [name] Shortness of breath    "when walking at incline or stairs"  . Sinus brady-tachy syndrome ([place])   . Squamous carcinoma summer 2015   back of left thigh  . Wrist fracture    right    Past Surgical History:  Procedure Laterality Date  . ATRIAL FIBRILLATION ABLATION N/A 11/11/2011   Procedure: ATRIAL FIBRILLATION ABLATION;  Surgeon: Thompson Grayer, MD;  Location: [facility] CATH LAB;  Service: Cardiovascular;  Laterality: N/A;  . BREAST SURGERY     benign cyst removed  . CARDIAC ELECTROPHYSIOLOGY STUDY AND  ABLATION  5/14, 7/14   convergent AF ablation at [facility] and subsequent atypical atrial flutter ablation by Dr Lehman Prom  . CARDIOVASCULAR STRESS TEST  06-06-2008   EF 73%  . CARDIOVERSION  09/09/2011   Procedure: CARDIOVERSION;  Surgeon: Darlin Coco, MD;  Location: [facility] ENDOSCOPY;  Service: Cardiovascular;  Laterality: N/A;  to be done by dr. Mare Ferrari  . CARDIOVERSION  02/18/2012   Procedure: CARDIOVERSION;  Surgeon: Thayer Headings, MD;  Location: [place];  Service: Cardiovascular;  Laterality: N/A;  . CHOLECYSTECTOMY  1995  . FEMUR FRACTURE SURGERY  2011   metal pin in place  . FOREARM SURGERY Left 2010   "opened arm to drain it"  . INSERTION OF MESH N/A 11/24/2013   Procedure: INSERTION OF MESH;  Surgeon: Michael Boston, MD;  Location: [facility];  Service: General;  Laterality: N/A;  . KNEE SURGERY Left 2001  . PACEMAKER PLACEMENT  10/20/12   MDT Advisa DR implanted by Dr Lehman Prom at [facility]   . SKIN CANCER DESTRUCTION  summer 2015  . TEE WITHOUT CARDIOVERSION  11/10/2011   Procedure: TRANSESOPHAGEAL ECHOCARDIOGRAM (TEE);  Surgeon: Thayer Headings, MD;  Location: [place];  Service: Cardiovascular;  Laterality: N/A;  . TONSILLECTOMY  as child  . US ECHOCARDIOGRAPHY  03-10-2008   Est EF 55-60%, Dr. Cathie Olden  . VENTRAL HERNIA REPAIR N/A 11/24/2013   Procedure:  LAPAROSCOPIC VENTRAL HERNIA;  Surgeon: Michael Boston, MD;  Location: [facility];  Service: General;  Laterality: N/A;  . WRIST SURGERY Right ~2009   metal rod in place    There were no vitals filed for this visit.      Subjective Assessment - 11/12/15 1407    Subjective Did HEP last visit.   Patient Stated Goals Pt's goal for therapy is to help Parkinson's to not get worse.   Currently in Pain? No/denies            Posture exercises for strengthening; Standing at the door frame: Performed 11/12/15 Stand with back against the door frame, feet hip width apart. 1. Head retraction- keep chin level and pull head straight back to  frame. 2. Shoulder blade squeezes 3. Alteranate Arm raises- Raise arm up towards door frame Held each for 5 seconds, 5x each                  PWR [facility]) - 11/12/15 1444 STANDING   PWR! Up x20   PWR! Rock [employer]! Twist x10   PWR Step x20   Comments progressed from compliant surface mat to balance beam (min guard)          Balance Exercises - 11/12/15 1423      Balance Exercises: Standing   Stepping Strategy Posterior;Anterior;Lateral  stepping with weight shifts; progressing to compliant surface and from intermittent to no UE support.           PT Education - 11/12/15 1445    Education provided Yes   Education Details Handout for postural strengthening at door frames, and reminder on walking program   Person(s) Educated Patient   Methods Explanation;Demonstration;Verbal cues;Handout   Comprehension Verbalized understanding;Returned demonstration;Verbal cues required             PT Long Term Goals - 10/31/15 1135      PT LONG TERM GOAL #1   Title Pt will demonstrate understanding of and independence with HEP to address Parkinson's specific deficits.  TARGET 11/29/15   Time 4   Period Weeks   Status New     PT LONG TERM GOAL #2   Title Pt will perform at least 8 of 10 reps of sit<>stand transfers from <18" surfaces, without UE support for improved transfer efficiency and safety.   Time 4   Period Weeks   Status New     PT LONG TERM GOAL #3   Title Pt will ambulate at least 600 ft in 3 minute walk test, for improved efficiency with gait.   Time 4   Period Weeks   Status New     PT LONG TERM GOAL #4   Title Pt will verbalize local Parkinson's resources, including ongoing fitness opportunities, upon D/C from PT.   Time 4   Period Weeks   Status New               Plan - 11/12/15 1553    Clinical Impression Statement Pt progressed understanding on stepping with weight shifts and was able to perform on compliant surface progressing  without UE support.  PWR! Moves in standing were attempted on compliant balance beam; pt able to perform with min guard to supervision.   Rehab Potential Good   PT Frequency 2x / week   PT Duration 4 weeks   PT Treatment/Interventions ADLs/Self Care Home Management;Functional mobility training;Gait training;Therapeutic activities;Therapeutic exercise;Balance training;Neuromuscular re-education;Patient/family education   PT Next Visit Plan Give information on  Silver Sneakers at [place];  Review forward and back step and weightshift activities; progress PWR! Moves in standing (FLOW, compliant surface)p   Consulted and Agree with Plan of Care Patient      Patient will benefit from skilled therapeutic intervention in order to improve the following deficits and impairments:  Abnormal gait, Decreased activity tolerance, Decreased mobility, Difficulty walking, Postural dysfunction  Visit Diagnosis: Abnormal posture  Other symptoms and signs involving the nervous system  Other abnormalities of gait and mobility     Problem List Patient Active Problem List   Diagnosis Date Noted  . Atypical atrial flutter ([place]) 05/28/2015  . Diastolic dysfunction 22/56/7209  . Ventral hernia 11/24/2013  . Ventral incisional hernia - epigastric 07/21/2013  . Long term (current) use of anticoagulants 07/21/2013  . Chronic diastolic heart failure ([place]) 12/01/2012  . Heart failure, acute diastolic ([place]) 19/80/2217  . Shortness of breath 11/23/2012  . Paralysis agitans ([place]) 10/26/2012  . 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 Sick sinus syndrome ([place]) 10/20/2012  . Acid reflux 06/09/2012  . HLD (hyperlipidemia) 06/09/2012  . 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 OP (osteoporosis) 06/09/2012  . Hypertension 10/12/2011  . Chest pain 06/03/2011  . Atrial fibrillation, chronic ([place]) 10/31/2010    Bjorn Loser, PTA  11/12/15, 3:59 PM [facility] [address] [place], [place], [address] Phone: [phone]   Fax:  [phone]  Name: Rehanna Oloughlin MRN: [id number] Date of Birth: 25-Jun-1940

## 2015-11-14 ENCOUNTER — Ambulatory Visit: Payer: Medicare Other | Admitting: Physical Therapy

## 2015-11-14 DIAGNOSIS — R293 Abnormal posture: Secondary | ICD-10-CM

## 2015-11-14 DIAGNOSIS — R2689 Other abnormalities of gait and mobility: Secondary | ICD-10-CM | POA: Diagnosis not present

## 2015-11-14 DIAGNOSIS — R29818 Other symptoms and signs involving the nervous system: Secondary | ICD-10-CM | POA: Diagnosis not present

## 2015-11-14 NOTE — Therapy (Signed)
[facility] [address] [place], [place], [address] Phone: [phone]   Fax:  [phone]  Physical Therapy Treatment  Patient Details  Name: [name] [name] MRN: [id number] Date of Birth: [birth year] Referring Provider: Tat  Encounter Date: 11/14/2015      PT End of Session - 11/14/15 0845    Visit Number 5   Number of Visits 9   Date for PT Re-Evaluation 11/29/15   Authorization Type Medicare; [employer] secondary-GCODE every 10th visit   PT Start Time 0815  Pt arrives late due to traffic   PT Stop Time 0844   PT Time Calculation (min) 29 min   Equipment Utilized During Treatment Gait belt   Activity Tolerance Patient tolerated treatment well   Behavior During Therapy [facility] for tasks assessed/performed      Past Medical History:  Diagnosis Date  . Arthritis    thumbs  . Atrial tachycardia ([place])   . Atypical atrial flutter ([place])   . Cataract    bilateral  . Chronic anticoagulation    on Xarelto  . Chronic diastolic CHF (congestive heart failure) (HCC)    Echocardiogram (11/24/12): Mild LVH, EF 60%.  [name] GERD (gastroesophageal reflux disease)   . Heart murmur    slight  . Hyperlipidemia    a. Hx of leg weakness while on statin. under control  . Hypertension   . Hypothyroidism   . Leg fracture Dec 21,2011  . Osteoporosis 07/2007  . Parkinson's disease ([place]) 2011  . Persistent atrial fibrillation (HCC)    s/p atrial fib ablation 11-11-11.   [name] Shortness of breath    "when walking at incline or stairs"  . Sinus brady-tachy syndrome ([place])   . Squamous carcinoma summer 2015   back of left thigh  . Wrist fracture    right    Past Surgical History:  Procedure Laterality Date  . ATRIAL FIBRILLATION ABLATION N/A 11/11/2011   Procedure: ATRIAL FIBRILLATION ABLATION;  Surgeon: Thompson Grayer, MD;  Location[facility] C[facility] LAB;  Service: Cardiovascular;  Laterality: N/A;  . BREAST SURGERY     benign cyst removed  .  CARDIAC ELECTROPHYSIOLOGY STUDY AND ABLATION  5/14, 7/14   convergent AF ablation at [facility] and subsequent atypical atrial flutter ablation by Dr Lehman Prom  . CARDIOVASCULAR STRESS TEST  06-06-2008   EF 73%  . CARDIOVERSION  09/09/2011   Procedure: CARDIOVERSION;  Surgeon: Darlin Coco, MD;  Location: [facility] ENDOSCOPY;  Service: Cardiovascular;  Laterality: N/A;  to be done by dr. Mare Ferrari  . CARDIOVERSION  02/18/2012   Procedure: CARDIOVERSION;  Surgeon: Thayer Headings, MD;  Location: [place];  Service: Cardiovascular;  Laterality: N/A;  . CHOLECYSTECTOMY  1995  . FEMUR FRACTURE SURGERY  2011   metal pin in place  . FOREARM SURGERY Left 2010   "opened arm to drain it"  . INSERTION OF MESH N/A 11/24/2013   Procedure: INSERTION OF MESH;  Surgeon: Michael Boston, MD;  [facility];  Service: General;  Laterality: N/A;  . KNEE SURGERY Left 2001  . PACEMAKER PLACEMENT  10/20/12   MDT Advisa DR implanted by Dr Lehman Prom at [facility]   . SKIN CANCER DESTRUCTION  summer 2015  . TEE WITHOUT CARDIOVERSION  11/10/2011   Procedure: TRANSESOPHAGEAL ECHOCARDIOGRAM (TEE);  Surgeon: Thayer Headings, MD;  Location: [facility];  Service: Cardiovascular;  Laterality: N/A;  . TONSILLECTOMY  as child  . US ECHOCARDIOGRAPHY  03-10-2008   Est EF 55-60%, Dr. Cathie Olden  . VENTRAL  HERNIA REPAIR N/A 11/24/2013   Procedure: LAPAROSCOPIC VENTRAL HERNIA;  Surgeon: Michael Boston, MD;  [facility];  Service: General;  Laterality: N/A;  . WRIST SURGERY Right ~2009   metal rod in place    There were no vitals filed for this visit.      Subjective Assessment - 11/14/15 0816    Subjective Feel frazzled this morning due to traffic-didn't sleep well this morning.     Patient Stated Goals Pt's goal for therapy is to help Parkinson's to not get worse.   Currently in Pain? No/denies                         [facility] Adult PT Treatment/Exercise - 11/14/15 0818      Self-Care   Self-Care Other Self-Care Comments    Other Self-Care Comments  Discussed community fitness options-including PWR! Moves Parkinson's class, Silver Sneakers, Tai Chi and [employer]           PWR [facility]) - 11/14/15 1610    PWR Step x 10 reps each side:  forward, side, back; then multi-directional stepping with coordinated UE movements-cues for large intensity movement   Basic 4 Flow x 5 reps PWR! Moves Flow in Standing   Comments Needs cues for intensity of movement patterns.    Review of standing postural HEP at doorframe-pt return demo understanding.         PT Education - 11/14/15 0845    Education provided Yes   Education Details Provided community fitness information-Silver [place], [place], [place]! Moves, [employer]) Educated Patient   Methods Explanation;Handout   Comprehension Verbalized understanding             PT Long Term Goals - 10/31/15 1135      PT LONG TERM GOAL #1   Title Pt will demonstrate understanding of and independence with HEP to address Parkinson's specific deficits.  TARGET 11/29/15   Time 4   Period Weeks   Status New     PT LONG TERM GOAL #2   Title Pt will perform at least 8 of 10 reps of sit<>stand transfers from <18" surfaces, without UE support for improved transfer efficiency and safety.   Time 4   Period Weeks   Status New     PT LONG TERM GOAL #3   Title Pt will ambulate at least 600 ft in 3 minute walk test, for improved efficiency with gait.   Time 4   Period Weeks   Status New     PT LONG TERM GOAL #4   Title Pt will verbalize local Parkinson's resources, including ongoing fitness opportunities, upon D/C from PT.   Time 4   Period Weeks   Status New               Plan - 11/14/15 0846    Clinical Impression Statement Pt needs cueing throughout session for increased intensity today-seems more distracted and fatigued (due to late wake-up, traffic, not sleeping well-per pt report).  Pt provided with community fitness  information during session today.  Pt will continue to benefit from further skilled PT to address balance, posture, gait.   Rehab Potential Good   PT Frequency 2x / week   PT Duration 4 weeks   PT Treatment/Interventions ADLs/Self Care Home Management;Functional mobility training;Gait training;Therapeutic activities;Therapeutic exercise;Balance training;Neuromuscular re-education;Patient/family education   PT Next Visit Plan Progress PWR! Moves in standing (FLOW, compliant surface); try quadruped  and supine PWR! Moves as part of HEP    Consulted and Agree with Plan of Care Patient      Patient will benefit from skilled therapeutic intervention in order to improve the following deficits and impairments:  Abnormal gait, Decreased activity tolerance, Decreased mobility, Difficulty walking, Postural dysfunction  Visit Diagnosis: Other symptoms and signs involving the nervous system  Abnormal posture     Problem List Patient Active Problem List   Diagnosis Date Noted  . Atypical atrial flutter ([place]) 05/28/2015  . Diastolic dysfunction 06/20/3356  . Ventral hernia 11/24/2013  . Ventral incisional hernia - epigastric 07/21/2013  . Long term (current) use of anticoagulants 07/21/2013  . Chronic diastolic heart failure ([place]) 12/01/2012  . Heart failure, acute diastolic ([place]) 25/18/9842  . Shortness of breath 11/23/2012  . Paralysis agitans ([place]) 10/26/2012  . 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 Sick sinus syndrome ([place]) 10/20/2012  . Acid reflux 06/09/2012  . HLD (hyperlipidemia) 06/09/2012  . 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 OP (osteoporosis) 06/09/2012  . Hypertension 10/12/2011  . Chest pain 06/03/2011  . Atrial fibrillation, chronic ([place]) 10/31/2010    Dioselina Brumbaugh W. 11/14/2015, 8:49 AM  Frazier Butt., PT  [facility] [address] [place], [place], [address] Phone: [phone]   Fax:   [phone]  Name: [name] [name] MRN: [id number] Date of Birth: 1940-02-12

## 2015-11-19 ENCOUNTER — Ambulatory Visit: Payer: Medicare Other | Admitting: Physical Therapy

## 2015-11-19 DIAGNOSIS — R29818 Other symptoms and signs involving the nervous system: Secondary | ICD-10-CM | POA: Diagnosis not present

## 2015-11-19 DIAGNOSIS — R293 Abnormal posture: Secondary | ICD-10-CM | POA: Diagnosis not present

## 2015-11-19 DIAGNOSIS — R2689 Other abnormalities of gait and mobility: Secondary | ICD-10-CM

## 2015-11-19 NOTE — Therapy (Signed)
[facility] [address] [place], [place], [address] Phone: [phone]   Fax:  [phone]  Physical Therapy Treatment  Patient Details  Name: [name] [name] MRN: [id number] Date of Birth: 1940/09/02 Referring Provider: Tat  Encounter Date: 11/19/2015      PT End of Session - 11/19/15 1314    Visit Number 6   Number of Visits 9   Date for PT Re-Evaluation 11/29/15   Authorization Type Medicare; [employer] secondary-GCODE every 10th visit   PT Start Time 0848   PT Stop Time 0930   PT Time Calculation (min) 42 min   Equipment Utilized During Treatment Gait belt   Activity Tolerance Patient tolerated treatment well   Behavior During Therapy [facility] for tasks assessed/performed      Past Medical History:  Diagnosis Date  . Arthritis    thumbs  . Atrial tachycardia ([place])   . Atypical atrial flutter ([place])   . Cataract    bilateral  . Chronic anticoagulation    on Xarelto  . Chronic diastolic CHF (congestive heart failure) (HCC)    Echocardiogram (11/24/12): Mild LVH, EF 60%.  [name] GERD (gastroesophageal reflux disease)   . Heart murmur    slight  . Hyperlipidemia    a. Hx of leg weakness while on statin. under control  . Hypertension   . Hypothyroidism   . Leg fracture Dec 21,2011  . Osteoporosis 07/2007  . Parkinson's disease ([place]) 2011  . Persistent atrial fibrillation (HCC)    s/p atrial fib ablation 11-11-11.   [name] Shortness of breath    "when walking at incline or stairs"  . Sinus brady-tachy syndrome ([place])   . Squamous carcinoma summer 2015   back of left thigh  . Wrist fracture    right    Past Surgical History:  Procedure Laterality Date  . ATRIAL FIBRILLATION ABLATION N/A 11/11/2011   Procedure: ATRIAL FIBRILLATION ABLATION;  Surgeon: Thompson Grayer, MD;  Location: [facility] CATH LAB;  Service: Cardiovascular;  Laterality: N/A;  . BREAST SURGERY     benign cyst removed  . CARDIAC ELECTROPHYSIOLOGY STUDY AND  ABLATION  5/14, 7/14   convergent AF ablation at [facility] and subsequent atypical atrial flutter ablation by Dr Lehman Prom  . CARDIOVASCULAR STRESS TEST  06-06-2008   EF 73%  . CARDIOVERSION  09/09/2011   Procedure: CARDIOVERSION;  Surgeon: Darlin Coco, MD;  Location: [facility] ENDOSCOPY;  Service: Cardiovascular;  Laterality: N/A;  to be done by dr. Mare Ferrari  . CARDIOVERSION  02/18/2012   Procedure: CARDIOVERSION;  Surgeon: Thayer Headings, MD;  Location: [place];  Service: Cardiovascular;  Laterality: N/A;  . CHOLECYSTECTOMY  1995  . FEMUR FRACTURE SURGERY  2011   metal pin in place  . FOREARM SURGERY Left 2010   "opened arm to drain it"  . INSERTION OF MESH N/A 11/24/2013   Procedure: INSERTION OF MESH;  Surgeon: Michael Boston, MD;  Location: [facility];  Service: General;  Laterality: N/A;  . KNEE SURGERY Left 2001  . PACEMAKER PLACEMENT  10/20/12   MDT Advisa DR implanted by Dr Lehman Prom at [facility]   . SKIN CANCER DESTRUCTION  summer 2015  . TEE WITHOUT CARDIOVERSION  11/10/2011   Procedure: TRANSESOPHAGEAL ECHOCARDIOGRAM (TEE);  Surgeon: Thayer Headings, MD;  Location: [place];  Service: Cardiovascular;  Laterality: N/A;  . TONSILLECTOMY  as child  . US ECHOCARDIOGRAPHY  03-10-2008   Est EF 55-60%, Dr. Cathie Olden  . VENTRAL HERNIA REPAIR N/A 11/24/2013   Procedure:  LAPAROSCOPIC VENTRAL HERNIA;  Surgeon: Michael Boston, MD;  Location: [facility];  Service: General;  Laterality: N/A;  . WRIST SURGERY Right ~2009   metal rod in place    There were no vitals filed for this visit.      Subjective Assessment - 11/19/15 0849    Subjective Doing well today.  Have been doing some of my exercises-they are going well.   Patient Stated Goals Pt's goal for therapy is to help Parkinson's to not get worse.   Currently in Pain? No/denies                         [facility] Adult PT Treatment/Exercise - 11/19/15 0001      Ambulation/Gait   Ambulation/Gait Yes   Ambulation/Gait Assistance 6: Modified  independent (Device/Increase time);5: Supervision   Ambulation/Gait Assistance Details Utilized bilateral walking poles for facilitation of improved reciprocal arm swing and increased step length.   Ambulation Distance (Feet) 630 Feet  then 300 ft.   Assistive device None  bilateral walking poles facilitating arm swing   Gait Pattern Decreased arm swing - right;Decreased arm swing - left;Step-through pattern;Decreased step length - right;Decreased step length - left;Decreased stance time - right;Decreased trunk rotation   Ambulation Surface Level;Indoor   Gait Comments Cues provided for relaxed shoulders, for improved relaxed arm swing and improved trunk rotation with gait.           PWR [facility]) - 11/19/15 0850    PWR Step x 10 reps forward, x 10 reps back, then multi-directional stepping varied directions, with additional cognitive and dual tasking calling out directions/colors while stepping.  Cues for continued increased intensity of movement.   Basic 4 Flow x 10 reps PWR! Moves FLOW in standing   Comments Needs cues for intensity of movement patterns          Balance Exercises - 11/19/15 1308      Balance Exercises: Standing   SLS Eyes open;Intermittent upper extremity support;Other reps (comment)  10 reps alternating step taps to 6", 12" step   Stepping Strategy Anterior;Posterior;Foam/compliant surface;10 reps  with UE support   Rockerboard Anterior/posterior;Head turns;EO;UE support;Other (comment)  Head nods, hip/ankle strategy work; min guard/S   Step Ups Forward;6 inch;UE support 2  x 10 reps each leg   Marching Limitations Marching in place, alternating forward kicks x 10 reps each standing on foam balance beam with UE support   Other Standing Exercises Standing on incline and on decline of ramp:  marching x 10, alternating forward step taps x 10, then head turns/head nods x 10 with EO; then eyes closed x 10 seconds                PT Long Term Goals -  10/31/15 1135      PT LONG TERM GOAL #1   Title Pt will demonstrate understanding of and independence with HEP to address Parkinson's specific deficits.  TARGET 11/29/15   Time 4   Period Weeks   Status New     PT LONG TERM GOAL #2   Title Pt will perform at least 8 of 10 reps of sit<>stand transfers from <18" surfaces, without UE support for improved transfer efficiency and safety.   Time 4   Period Weeks   Status New     PT LONG TERM GOAL #3   Title Pt will ambulate at least 600 ft in 3 minute walk test, for improved efficiency with gait.  Time 4   Period Weeks   Status New     PT LONG TERM GOAL #4   Title Pt will verbalize local Parkinson's resources, including ongoing fitness opportunities, upon D/C from PT.   Time 4   Period Weeks   Status New               Plan - 11/19/15 1314    Clinical Impression Statement Pt continues to need cues for intensity of movement patterns, especially with added cognitive challenge to PWR! Moves activities.  Focused session today on balance and on gait.  Pt will be out the rest of the week due to being out of town.  Will likely need to check goals next week and discuss D/C versus renew.   Rehab Potential Good   PT Frequency 2x / week   PT Duration 4 weeks   PT Treatment/Interventions ADLs/Self Care Home Management;Functional mobility training;Gait training;Therapeutic activities;Therapeutic exercise;Balance training;Neuromuscular re-education;Patient/family education   PT Next Visit Plan Progress PWR! Moves in standing (FLOW, compliant surface) as part of HEP; try quadruped and supine PWR! Moves as part of HEP?; will need to check goals-discuss D/C versus renew   Consulted and Agree with Plan of Care Patient      Patient will benefit from skilled therapeutic intervention in order to improve the following deficits and impairments:  Abnormal gait, Decreased activity tolerance, Decreased mobility, Difficulty walking, Postural  dysfunction  Visit Diagnosis: Other symptoms and signs involving the nervous system  Other abnormalities of gait and mobility     Problem List Patient Active Problem List   Diagnosis Date Noted  . Atypical atrial flutter ([place]) 05/28/2015  . Diastolic dysfunction [id number]  . Ventral hernia 11/24/2013  . Ventral incisional hernia - epigastric 07/21/2013  . Long term (current) use of anticoagulants 07/21/2013  . Chronic diastolic heart failure ([place]) 12/01/2012  . Heart failure, acute diastolic ([place]) 123456  . Shortness of breath 11/23/2012  . Paralysis agitans ([place]) 10/26/2012  . 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 Sick sinus syndrome ([place]) 10/20/2012  . Acid reflux 06/09/2012  . HLD (hyperlipidemia) 06/09/2012  . 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 OP (osteoporosis) 06/09/2012  . Hypertension 10/12/2011  . Chest pain 06/03/2011  . Atrial fibrillation, chronic ([place]) 10/31/2010    Jeromiah Ohalloran W. 11/19/2015, 1:19 PM  Frazier Butt., PT [facility] [address] [place], [place], [address] Phone: [phone]   Fax:  [phone]  Name: Darleny [place] MRN: [id number] Date of Birth: [birth year]

## 2015-11-26 ENCOUNTER — Ambulatory Visit: Payer: Medicare Other | Admitting: Physical Therapy

## 2015-11-26 DIAGNOSIS — R29818 Other symptoms and signs involving the nervous system: Secondary | ICD-10-CM | POA: Diagnosis not present

## 2015-11-26 DIAGNOSIS — R2689 Other abnormalities of gait and mobility: Secondary | ICD-10-CM

## 2015-11-26 DIAGNOSIS — R293 Abnormal posture: Secondary | ICD-10-CM | POA: Diagnosis not present

## 2015-11-26 NOTE — Therapy (Signed)
[facility] [address]  [place], [place], [address] Phone: [phone]   Fax:  [phone]  Physical Therapy Treatment  Patient Details  Name: [name] [name] MRN: [id number] Date of Birth: 1940/12/23 Referring Provider: Tat  Encounter Date: 11/26/2015      PT End of Session - 11/26/15 1306    Visit Number 7   Number of Visits 9   Date for PT Re-Evaluation 11/29/15   Authorization Type Medicare; [employer] secondary-GCODE every 10th visit   PT Start Time 0847   PT Stop Time 0932   PT Time Calculation (min) 45 min   Activity Tolerance Patient tolerated treatment well   Behavior During Therapy [facility] for tasks assessed/performed      Past Medical History:  Diagnosis Date  . Arthritis    thumbs  . Atrial tachycardia ([place])   . Atypical atrial flutter ([place])   . Cataract    bilateral  . Chronic anticoagulation    on Xarelto  . Chronic diastolic CHF (congestive heart failure) (HCC)    Echocardiogram (11/24/12): Mild LVH, EF 60%.  [name] GERD (gastroesophageal reflux disease)   . Heart murmur    slight  . Hyperlipidemia    a. Hx of leg weakness while on statin. under control  . Hypertension   . Hypothyroidism   . Leg fracture Dec 21,2011  . Osteoporosis 07/2007  . Parkinson's disease ([place]) 2011  . Persistent atrial fibrillation (HCC)    s/p atrial fib ablation 11-11-11.   [name] Shortness of breath    "when walking at incline or stairs"  . Sinus brady-tachy syndrome ([place])   . Squamous carcinoma summer 2015   back of left thigh  . Wrist fracture    right    Past Surgical History:  Procedure Laterality Date  . ATRIAL FIBRILLATION ABLATION N/A 11/11/2011   Procedure: ATRIAL FIBRILLATION ABLATION;  Surgeon: Thompson Grayer, MD;  [facility] C[facility] LAB;  Service: Cardiovascular;  Laterality: N/A;  . BREAST SURGERY     benign cyst removed  . CARDIAC ELECTROPHYSIOLOGY STUDY AND ABLATION  5/14, 7/14   convergent AF ablation at  [facility] and subsequent atypical atrial flutter ablation by Dr Lehman Prom  . CARDIOVASCULAR STRESS TEST  06-06-2008   EF 73%  . CARDIOVERSION  09/09/2011   Procedure: CARDIOVERSION;  Surgeon: Darlin Coco, MD;  Location: [facility] ENDOSCOPY;  Service: Cardiovascular;  Laterality: N/A;  to be done by dr. Mare Ferrari  . CARDIOVERSION  02/18/2012   Procedure: CARDIOVERSION;  Surgeon: Thayer Headings, MD;  Location: [place];  Service: Cardiovascular;  Laterality: N/A;  . CHOLECYSTECTOMY  1995  . FEMUR FRACTURE SURGERY  2011   metal pin in place  . FOREARM SURGERY Left 2010   "opened arm to drain it"  . INSERTION OF MESH N/A 11/24/2013   Procedure: INSERTION OF MESH;  Surgeon: Michael Boston, MD;  Location: [facility];  Service: General;  Laterality: N/A;  . KNEE SURGERY Left 2001  . PACEMAKER PLACEMENT  10/20/12   MDT Advisa DR implanted by Dr Lehman Prom at [facility]   . SKIN CANCER DESTRUCTION  summer 2015  . TEE WITHOUT CARDIOVERSION  11/10/2011   Procedure: TRANSESOPHAGEAL ECHOCARDIOGRAM (TEE);  Surgeon: Thayer Headings, MD;  Location: [facility];  Service: Cardiovascular;  Laterality: N/A;  . TONSILLECTOMY  as child  . US ECHOCARDIOGRAPHY  03-10-2008   Est EF 55-60%, Dr. Cathie Olden  . VENTRAL HERNIA REPAIR N/A 11/24/2013   Procedure: LAPAROSCOPIC VENTRAL HERNIA;  Surgeon: Michael Boston, MD;  [facility];  Service: General;  Laterality: N/A;  . WRIST SURGERY Right ~2009   metal rod in place    There were no vitals filed for this visit.      Subjective Assessment - 11/26/15 0849    Subjective Was at the beach most of last week; got into bad habits and I've slept a lot.   Patient Stated Goals Pt's goal for therapy is to help Parkinson's to not get worse.   Currently in Pain? No/denies                         [facility] Adult PT Treatment/Exercise - 11/26/15 [id number]      Ambulation/Gait   Ambulation/Gait Yes   Ambulation/Gait Assistance 6: Modified independent (Device/Increase time);5: Supervision    Ambulation Distance (Feet) 400 Feet  then 200 ft.   Assistive device None   Gait Pattern Decreased arm swing - right;Decreased arm swing - left;Step-through pattern;Decreased step length - right;Decreased step length - left;Decreased stance time - right;Decreased trunk rotation  improved with cues   Ambulation Surface Level;Indoor   Gait Comments Cues provided for increased amplitude of arm swing and foot clearance/heelstrike     Self-Care   Self-Care Other Self-Care Comments   Other Self-Care Comments  Discussed/provided information on PWR! Moves and PWR! Circuit classes and discussed benefits of one of those classes going forward after discharge from PT.  Discussed and provided information on [employer] Parkinson's community group.           PWR [facility]) - 11/26/15 0901    PWR! exercises Moves in [place];Moves in supine   PWR! Up x 10    PWR! Rock x 10    PWR! Twist x 10   PWR! Step x 10   Comments Visual, verbal cues for technique and intensity of PWR! Moves in quadruped   PWR! Up x 10 through shoulders, x 10 reps through hips   PWR! Rock x 10 reps each side   PWR! Twist x 10 reps each side   PWR! Step x 10 reps each side   Comments Verbal cues for technique and intensity of movement patterns in supine             PT Education - 11/26/15 1305    Education provided Yes   Education Details Provided info again on PWR! Moves and [employer] Circuit classes; [employer] Parkinson's group; handouts for PWR! Moves in supine, quadruped and PWR! Flow standing   Person(s) Educated Patient   Methods Explanation   Comprehension Verbalized understanding;Returned demonstration;Verbal cues required             PT Long Term Goals - 10/31/15 1135      PT LONG TERM GOAL #1   Title Pt will demonstrate understanding of and independence with HEP to address Parkinson's specific deficits.  TARGET 11/29/15   Time 4   Period Weeks   Status New     PT LONG TERM GOAL #2   Title Pt will  perform at least 8 of 10 reps of sit<>stand transfers from <18" surfaces, without UE support for improved transfer efficiency and safety.   Time 4   Period Weeks   Status New     PT LONG TERM GOAL #3   Title Pt will ambulate at least 600 ft in 3 minute walk test, for improved efficiency with gait.   Time 4   Period Weeks   Status New  PT LONG TERM GOAL #4   Title Pt will verbalize local Parkinson's resources, including ongoing fitness opportunities, upon D/C from PT.   Time 4   Period Weeks   Status New               Plan - 11/26/15 1306    Clinical Impression Statement Pt continues to need cues for intensity of movement patterns with gait and with exercises.  Added quadruped and supine PWR! Moves to HEP.  Will plan to check goals next visit and discuss D/C versus renew.   Rehab Potential Good   PT Frequency 2x / week   PT Duration 4 weeks   PT Treatment/Interventions ADLs/Self Care Home Management;Functional mobility training;Gait training;Therapeutic activities;Therapeutic exercise;Balance training;Neuromuscular re-education;Patient/family education   PT Next Visit Plan Check Goals, check PWR! Quadruped and supine PWR! Moves, discuss d/c versus renew   Consulted and Agree with Plan of Care Patient      Patient will benefit from skilled therapeutic intervention in order to improve the following deficits and impairments:  Abnormal gait, Decreased activity tolerance, Decreased mobility, Difficulty walking, Postural dysfunction  Visit Diagnosis: Other abnormalities of gait and mobility  Other symptoms and signs involving the nervous system     Problem List Patient Active Problem List   Diagnosis Date Noted  . Atypical atrial flutter ([place]) 05/28/2015  . Diastolic dysfunction [id number]  . Ventral hernia 11/24/2013  . Ventral incisional hernia - epigastric 07/21/2013  . Long term (current) use of anticoagulants 07/21/2013  . Chronic diastolic heart failure ([place])  12/01/2012  . Heart failure, acute diastolic ([place]) 123456  . Shortness of breath 11/23/2012  . Paralysis agitans ([place]) 10/26/2012  . 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 Sick sinus syndrome ([place]) 10/20/2012  . Acid reflux 06/09/2012  . HLD (hyperlipidemia) 06/09/2012  . 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 OP (osteoporosis) 06/09/2012  . Hypertension 10/12/2011  . Chest pain 06/03/2011  . Atrial fibrillation, chronic ([place]) 10/31/2010    [name] [name] W. 11/26/2015, 1:09 PM  Frazier Butt., PT  [facility] [address] [place], [place], [address] Phone: [phone]   Fax:  [phone]  Name: [name] [name] MRN: [id number] Date of Birth: 07-09-1940

## 2015-11-28 ENCOUNTER — Ambulatory Visit: Payer: Medicare Other | Admitting: Physical Therapy

## 2015-11-28 DIAGNOSIS — R293 Abnormal posture: Secondary | ICD-10-CM | POA: Diagnosis not present

## 2015-11-28 DIAGNOSIS — R2689 Other abnormalities of gait and mobility: Secondary | ICD-10-CM

## 2015-11-28 DIAGNOSIS — R29818 Other symptoms and signs involving the nervous system: Secondary | ICD-10-CM | POA: Diagnosis not present

## 2015-11-28 NOTE — Patient Instructions (Signed)

## 2015-11-28 NOTE — Therapy (Signed)
[name] [name] [address] [name], [place], [address] Phone: [phone]   Fax:  [phone]  Physical Therapy Treatment  Patient Details  Name: [name] [name] MRN: [id number] Date of Birth: 08/16/1940 Referring Provider: Tat  Encounter Date: 11/28/2015      PT End of Session - 11/28/15 2150    Visit Number 8   Number of Visits 9   Date for PT Re-Evaluation 11/29/15   Authorization Type Medicare; [employer] secondary-GCODE every 10th visit   PT Start Time 0850   PT Stop Time 0928   PT Time Calculation (min) 38 min   Activity Tolerance Patient tolerated treatment well   Behavior During Therapy [facility] for tasks assessed/performed      Past Medical History:  Diagnosis Date  . Arthritis    thumbs  . Atrial tachycardia ([place])   . Atypical atrial flutter ([place])   . Cataract    bilateral  . Chronic anticoagulation    on Xarelto  . Chronic diastolic CHF (congestive heart failure) (HCC)    Echocardiogram (11/24/12): Mild LVH, EF 60%.  [name] GERD (gastroesophageal reflux disease)   . Heart murmur    slight  . Hyperlipidemia    a. Hx of leg weakness while on statin. under control  . Hypertension   . Hypothyroidism   . Leg fracture Dec 21,2011  . Osteoporosis 07/2007  . Parkinson's disease ([place]) 2011  . Persistent atrial fibrillation (HCC)    s/p atrial fib ablation 11-11-11.   [name] Shortness of breath    "when walking at incline or stairs"  . Sinus brady-tachy syndrome ([place])   . Squamous carcinoma summer 2015   back of left thigh  . Wrist fracture    right    Past Surgical History:  Procedure Laterality Date  . ATRIAL FIBRILLATION ABLATION N/A 11/11/2011   Procedure: ATRIAL FIBRILLATION ABLATION;  Surgeon: Thompson Grayer, MD;  Location: [facility] CATH LAB;  Service: Cardiovascular;  Laterality: N/A;  . BREAST SURGERY     benign cyst removed  . CARDIAC ELECTROPHYSIOLOGY STUDY AND ABLATION  5/14, 7/14   convergent AF ablation at  [facility] and subsequent atypical atrial flutter ablation by Dr Lehman Prom  . CARDIOVASCULAR STRESS TEST  06-06-2008   EF 73%  . CARDIOVERSION  09/09/2011   Procedure: CARDIOVERSION;  Surgeon: Darlin Coco, MD;  Location: [facility] ENDOSCOPY;  Service: Cardiovascular;  Laterality: N/A;  to be done by dr. Mare Ferrari  . CARDIOVERSION  02/18/2012   Procedure: CARDIOVERSION;  Surgeon: Thayer Headings, MD;  Location: [place];  Service: Cardiovascular;  Laterality: N/A;  . CHOLECYSTECTOMY  1995  . FEMUR FRACTURE SURGERY  2011   metal pin in place  . FOREARM SURGERY Left 2010   "opened arm to drain it"  . INSERTION OF MESH N/A 11/24/2013   Procedure: INSERTION OF MESH;  Surgeon: Michael Boston, MD;  [facility];  Service: General;  Laterality: N/A;  . KNEE SURGERY Left 2001  . PACEMAKER PLACEMENT  10/20/12   MDT Advisa DR implanted by Dr Lehman Prom [facility]   . SKIN CANCER DESTRUCTION  summer 2015  . TEE WITHOUT CARDIOVERSION  11/10/2011   Procedure: TRANSESOPHAGEAL ECHOCARDIOGRAM (TEE);  Surgeon: Thayer Headings, MD;  Location: [place];  Service: Cardiovascular;  Laterality: N/A;  . TONSILLECTOMY  as child  . US ECHOCARDIOGRAPHY  03-10-2008   Est EF 55-60%, Dr. Cathie Olden  . VENTRAL HERNIA REPAIR N/A 11/24/2013   Procedure: LAPAROSCOPIC VENTRAL HERNIA;  Surgeon: Michael Boston, MD;  [facility];  Service: General;  Laterality: N/A;  . WRIST SURGERY Right ~2009   metal rod in place    There were no vitals filed for this visit.      Subjective Assessment - 11/28/15 0852    Subjective Caught my right foot on carpet on Monday and fell onto my knee-that happens sometimes.   Patient Stated Goals Pt's goal for therapy is to help Parkinson's to not get worse.   Currently in Pain? Yes   Pain Score 1    Pain Location Knee   Pain Orientation Left   Pain Descriptors / Indicators Aching   Pain Type Acute pain   Pain Onset In the past 7 days   Pain Frequency Intermittent   Aggravating Factors  Fall the  other day   Pain Relieving Factors getting better                         [facility] Adult PT Treatment/Exercise - 11/28/15 0901      Transfers   Transfers Sit to Stand;Stand to Sit   Sit to Stand 6: Modified independent (Device/Increase time);Without upper extremity assist;From chair/3-in-1   Stand to Sit 6: Modified independent (Device/Increase time);Without upper extremity assist;To chair/3-in-1   Number of Reps 10 reps;Other sets (comment)  each from 20", then 16" surfaces     Ambulation/Gait   Ambulation/Gait Yes   Ambulation/Gait Assistance 6: Modified independent (Device/Increase time);5: Supervision   Ambulation Distance (Feet) 670 Feet  total; 560 ft in 3 minute walk test; then 100 ft x 2   Assistive device None   Ambulation Surface Level;Indoor   Gait velocity 9.74 sec= 3.37 ft/sec   Gait Comments Cues provided for increased amplitude of arm swing and foot clearance/heelstrike     Standardized Balance Assessment   Standardized Balance Assessment Timed Up and Go Test     Timed Up and Go Test   TUG Normal TUG   Normal TUG (seconds) 10.3     Self-Care   Self-Care Other Self-Care Comments   Other Self-Care Comments  Fall prevention education provided, in light of recent fall.  Reviewed importance of consistent performance of HEP for large amplitude, high intensity functional movements in order to carry over into gait and daily activities.  Discussed discharge, progress towards goals, plans for discharge this visit.  Discussed return PD screen in 6 months.           [facility] - 11/28/15 0915    PWR! exercises Moves in [place];Moves in supine  Reviewed HEP from last visit-pt return demo understanding   Comments As noted above-reviewed HEP-preformed in modified quadruped due to knee pain from fall on Monday   Comments Pt return demo understanding of PWR! MOves in supine             PT Education - 11/28/15 2150    Education provided Yes    Education Details Fall prevention, progress towards goals, POC, and plans for discharge   Person(s) Educated Patient   Methods Explanation;Handout   Comprehension Verbalized understanding             PT Long Term Goals - 11/28/15 0855      PT LONG TERM GOAL #1   Title Pt will demonstrate understanding of and independence with HEP to address Parkinson's specific deficits.  TARGET 11/29/15   Time 4   Period Weeks   Status Achieved     PT LONG TERM GOAL #2  Title Pt will perform at least 8 of 10 reps of sit<>stand transfers from <18" surfaces, without UE support for improved transfer efficiency and safety.   Time 4   Period Weeks   Status Achieved     PT LONG TERM GOAL #3   Title Pt will ambulate at least 600 ft in 3 minute walk test, for improved efficiency with gait.   Time 4   Period Weeks   Status Not Met     PT LONG TERM GOAL #4   Title Pt will verbalize local Parkinson's resources, including ongoing fitness opportunities, upon D/C from PT.   Time 4   Period Weeks   Status Achieved               Plan - 12-04-2015 2151    Clinical Impression Statement Pt has met LTG #1, 2, 4.  LTG #3 not met.  Pt reported not consistently doing exercises at home.  Needs continued reminders and cues for large amplitude movement patterns, but patient is agreeable to discharge today, as this week ends current POC.     Rehab Potential Good   PT Frequency 2x / week   PT Duration 4 weeks   PT Treatment/Interventions ADLs/Self Care Home Management;Functional mobility training;Gait training;Therapeutic activities;Therapeutic exercise;Balance training;Neuromuscular re-education;Patient/family education   PT Next Visit Plan Discharge this visit.   Consulted and Agree with Plan of Care Patient      Patient will benefit from skilled therapeutic intervention in order to improve the following deficits and impairments:  Abnormal gait, Decreased activity tolerance, Decreased mobility,  Difficulty walking, Postural dysfunction  Visit Diagnosis: Other abnormalities of gait and mobility  Other symptoms and signs involving the nervous system       G-Codes - 12/04/2015 2154    Functional Assessment Tool Used Independent with HEP for Parkinson's specific deficits   Functional Limitation Self care   Self Care Goal Status (Q5956) At least 1 percent but less than 20 percent impaired, limited or restricted   Self Care Discharge Status [phone]) At least 1 percent but less than 20 percent impaired, limited or restricted      Problem List Patient Active Problem List   Diagnosis Date Noted  . Atypical atrial flutter ([place]) 05/28/2015  . Diastolic dysfunction 43/32/9518  . Ventral hernia 11/24/2013  . Ventral incisional hernia - epigastric 07/21/2013  . Long term (current) use of anticoagulants 07/21/2013  . Chronic diastolic heart failure ([place]) 12/01/2012  . Heart failure, acute diastolic ([place]) 84/16/6063  . Shortness of breath 11/23/2012  . Paralysis agitans ([place]) 10/26/2012  . 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 Sick sinus syndrome ([place]) 10/20/2012  . Acid reflux 06/09/2012  . HLD (hyperlipidemia) 06/09/2012  . 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 OP (osteoporosis) 06/09/2012  . Hypertension 10/12/2011  . Chest pain 06/03/2011  . Atrial fibrillation, chronic ([place]) 10/31/2010    Ryelle Ruvalcaba W. December 04, 2015, 9:55 PM  Frazier Butt., PT  [place] [address] [place], [place], [address] Phone: [phone]   Fax:  [phone]  Name: [name] [name] MRN: [id number] Date of Birth: March 10, 1940   PHYSICAL THERAPY DISCHARGE SUMMARY  Visits from Start of Care: 8  Current functional level related to goals / functional outcomes:     PT Long Term Goals - 04-Dec-2015 0855      PT LONG TERM GOAL #1   Title Pt will demonstrate understanding of and independence with HEP to address  Parkinson's specific deficits.  TARGET  11/29/15   Time 4   Period Weeks   Status Achieved     PT LONG TERM GOAL #2   Title Pt will perform at least 8 of 10 reps of sit<>stand transfers from <18" surfaces, without UE support for improved transfer efficiency and safety.   Time 4   Period Weeks   Status Achieved     PT LONG TERM GOAL #3   Title Pt will ambulate at least 600 ft in 3 minute walk test, for improved efficiency with gait.   Time 4   Period Weeks   Status Not Met     PT LONG TERM GOAL #4   Title Pt will verbalize local Parkinson's resources, including ongoing fitness opportunities, upon D/C from PT.   Time 4   Period Weeks   Status Achieved     Pt has met 3 of 4 goals set at initial evaluation.  Have instructed patient in various aspects of Parkinson's specific exercises to address bradykinesia, rigidity, timing and coordination of gait.   Remaining deficits: Bradykinesia, rigidity, timing and coordination of gait, posture   Education / Equipment: Educated in [employer], community Parkinson's disease resources, fall prevention.  Plan: Patient agrees to discharge.  Patient goals were partially met. Patient is being discharged due to being pleased with the current functional level.  ?????  Recommend return screen in 6 months for PT, OT, and speech therapy due to progressive nature of the disease.  Mady Haagensen, PT 11/28/15 10:00 PM Phone: [phone] Fax: [phone]

## 2015-12-03 ENCOUNTER — Ambulatory Visit: Payer: Medicare Other | Admitting: Obstetrics & Gynecology

## 2015-12-05 ENCOUNTER — Ambulatory Visit: Payer: Medicare Other | Admitting: Physical Therapy

## 2015-12-31 ENCOUNTER — Encounter: Payer: Self-pay | Admitting: Internal Medicine

## 2015-12-31 ENCOUNTER — Ambulatory Visit (INDEPENDENT_AMBULATORY_CARE_PROVIDER_SITE_OTHER): Payer: Medicare Other | Admitting: Internal Medicine

## 2015-12-31 VITALS — BP 128/80 | HR 87 | Ht 62.0 in | Wt 141.4 lb

## 2015-12-31 DIAGNOSIS — I495 Sick sinus syndrome: Secondary | ICD-10-CM | POA: Diagnosis not present

## 2015-12-31 DIAGNOSIS — I480 Paroxysmal atrial fibrillation: Secondary | ICD-10-CM

## 2015-12-31 DIAGNOSIS — R0602 Shortness of breath: Secondary | ICD-10-CM

## 2015-12-31 DIAGNOSIS — Z950 Presence of cardiac pacemaker: Secondary | ICD-10-CM | POA: Diagnosis not present

## 2015-12-31 LAB — CUP PACEART INCLINIC DEVICE CHECK
Battery Remaining Longevity: 31 mo
Battery Voltage: 2.96 V
Brady Statistic AP VS Percent: 3.3 %
Brady Statistic AS VS Percent: 21.93 %
Date Time Interrogation Session: 20171127162330
Implantable Lead Implant Date: 20140917
Implantable Lead Location: 753859
Lead Channel Impedance Value: 323 Ohm
Lead Channel Pacing Threshold Amplitude: 0.625 V
Lead Channel Pacing Threshold Amplitude: 1 V
Lead Channel Sensing Intrinsic Amplitude: 0.3 mV
Lead Channel Sensing Intrinsic Amplitude: 0.375 mV
Lead Channel Sensing Intrinsic Amplitude: 12.5 mV
Lead Channel Setting Pacing Amplitude: 2.5 V
MDC IDC LEAD IMPLANT DT: 20140917
MDC IDC LEAD LOCATION: 753860
MDC IDC MSMT LEADCHNL RA IMPEDANCE VALUE: 285 Ohm
MDC IDC MSMT LEADCHNL RA PACING THRESHOLD PULSEWIDTH: 0.4 ms
MDC IDC MSMT LEADCHNL RV IMPEDANCE VALUE: 342 Ohm
MDC IDC MSMT LEADCHNL RV IMPEDANCE VALUE: 418 Ohm
MDC IDC MSMT LEADCHNL RV PACING THRESHOLD PULSEWIDTH: 0.4 ms
MDC IDC MSMT LEADCHNL RV SENSING INTR AMPL: 14.5 mV
MDC IDC PG IMPLANT DT: 20140917
MDC IDC SET LEADCHNL RA PACING AMPLITUDE: 2 V
MDC IDC SET LEADCHNL RV PACING PULSEWIDTH: 0.4 ms
MDC IDC SET LEADCHNL RV SENSING SENSITIVITY: 0.9 mV
MDC IDC STAT BRADY AP VP PERCENT: 29.26 %
MDC IDC STAT BRADY AS VP PERCENT: 45.52 %
MDC IDC STAT BRADY RA PERCENT PACED: 29.36 %
MDC IDC STAT BRADY RV PERCENT PACED: 73.73 %

## 2015-12-31 MED ORDER — FUROSEMIDE 40 MG PO TABS: 40.0000 mg | ORAL_TABLET | Freq: Two times a day (BID) | ORAL | 3 refills | Status: DC

## 2015-12-31 NOTE — Progress Notes (Signed)
Electrophysiology Office Note   Date:  12/31/2015   ID:  [name] [name], DOB [birth year], MRN [id number]  PCP:  Jerlyn Ly, MD  Cardiologist:  Dr Acie Fredrickson Primary Electrophysiologist: Thompson Grayer, MD    Chief Complaint  Patient presents with  . Atrial Fibrillation     History of Present Illness: [name] [name] is a 75 y.o. female who presents today for electrophysiology follow-up. She continues to have progressive SOB.  She has occasional nausea.  Recently, she has had BLE edema.  She has persistent R lung effusion.  AF burden has increased with reduced amiodarone.  Today, she denies symptoms of palpitations, chest pain, orthopnea, PND,claudication, dizziness, presyncope, syncope, bleeding, or neurologic sequela. The patient is tolerating medications without difficulties and is otherwise without complaint today.    Past Medical History:  Diagnosis Date  . Arthritis    thumbs  . Atrial tachycardia ([place])   . Atypical atrial flutter ([place])   . Cataract    bilateral  . Chronic anticoagulation    on Xarelto  . Chronic diastolic CHF (congestive heart failure) (HCC)    Echocardiogram (11/24/12): Mild LVH, EF 60%.  [name] GERD (gastroesophageal reflux disease)   . Heart murmur    slight  . Hyperlipidemia    a. Hx of leg weakness while on statin. under control  . Hypertension   . Hypothyroidism   . Leg fracture Dec 21,2011  . Osteoporosis 07/2007  . Parkinson's disease ([place]) 2011  . Persistent atrial fibrillation (HCC)    s/p atrial fib ablation 11-11-11.   [name] Shortness of breath    "when walking at incline or stairs"  . Sinus brady-tachy syndrome ([place])   . Squamous carcinoma summer 2015   back of left thigh  . Wrist fracture    right   Past Surgical History:  Procedure Laterality Date  . ATRIAL FIBRILLATION ABLATION N/A 11/11/2011   Procedure: ATRIAL FIBRILLATION ABLATION;  Surgeon: Thompson Grayer, MD;  Location: [facility] CATH LAB;  Service: Cardiovascular;  Laterality: N/A;  .  BREAST SURGERY     benign cyst removed  . CARDIAC ELECTROPHYSIOLOGY STUDY AND ABLATION  5/14, 7/14   convergent AF ablation at [facility] and subsequent atypical atrial flutter ablation by Dr Lehman Prom  . CARDIOVASCULAR STRESS TEST  06-06-2008   EF 73%  . CARDIOVERSION  09/09/2011   Procedure: CARDIOVERSION;  Surgeon: Darlin Coco, MD;  Location: [facility] ENDOSCOPY;  Service: Cardiovascular;  Laterality: N/A;  to be done by dr. Mare Ferrari  . CARDIOVERSION  02/18/2012   Procedure: CARDIOVERSION;  Surgeon: Thayer Headings, MD;  Location: [place];  Service: Cardiovascular;  Laterality: N/A;  . CHOLECYSTECTOMY  1995  . FEMUR FRACTURE SURGERY  2011   metal pin in place  . FOREARM SURGERY Left 2010   "opened arm to drain it"  . INSERTION OF MESH N/A 11/24/2013   Procedure: INSERTION OF MESH;  Surgeon: Michael Boston, MD;  [facility];  Service: General;  Laterality: N/A;  . KNEE SURGERY Left 2001  . PACEMAKER PLACEMENT  10/20/12   MDT Advisa DR [facility] by Dr Lehman Prom [facility]   . SKIN CANCER DESTRUCTION  summer 2015  . TEE WITHOUT CARDIOVERSION  11/10/2011   Procedure: TRANSESOPHAGEAL ECHOCARDIOGRAM (TEE);  Surgeon: Thayer Headings, MD;  Location: [place];  Service: Cardiovascular;  Laterality: N/A;  . TONSILLECTOMY  as child  . US ECHOCARDIOGRAPHY  03-10-2008   Est EF 55-60%, Dr. Cathie Olden  . VENTRAL HERNIA REPAIR N/A 11/24/2013   Procedure: LAPAROSCOPIC  VENTRAL HERNIA;  Surgeon: Michael Boston, MD;  [facility]: [facility];  Service: General;  Laterality: N/A;  . WRIST SURGERY Right ~2009   metal rod in place     Current Outpatient Prescriptions  Medication Sig Dispense Refill  . acetaminophen (TYLENOL) 500 MG tablet Take 1,000 mg by mouth every 4 (four) hours as needed for mild pain or headache.    . AZILECT 1 MG TABS tablet TAKE 1 TABLET (1 MG TOTAL) BY MOUTH DAILY.  3  . CALCIUM PO Take 1 tablet by mouth every morning.     . Calcium Polycarbophil (FIBERCON PO) Take 2 tablets by mouth every morning.      . carbidopa-levodopa (SINEMET IR) 25-100 MG tablet Take 1 tablet by mouth 3 (three) times daily. 90 tablet 5  . clonazePAM (KLONOPIN) 0.5 MG tablet Take 1 tablet (0.5 mg total) by mouth at bedtime. 30 tablet 5  . Coenzyme Q10 (CO Q 10 PO) Take 1 tablet by mouth every morning.     Mariane Baumgarten Sodium (COLACE PO) Take 1 capsule by mouth 2 (two) times daily.     [name] ELIQUIS 5 MG TABS tablet Take 5 mg by mouth 2 (two) times daily.     [name] ezetimibe (ZETIA) 10 MG tablet Take 10 mg by mouth every other day. Taking the same day as the crestor    . furosemide (LASIX) 40 MG tablet Take 1 tablet (40 mg total) by mouth 2 (two) times daily. 180 tablet 3  . levothyroxine (SYNTHROID, LEVOTHROID) 100 MCG tablet Take 100 mcg by mouth daily.  2  . MAGNESIUM PO Take 1 tablet by mouth at bedtime.     . metoprolol succinate (TOPROL-XL) 25 MG 24 hr tablet Take 1 tablet (25 mg total) by mouth daily. 45 tablet 3  . multivitamin-lutein (OCUVITE-LUTEIN) CAPS capsule Take 1 capsule by mouth daily.    . potassium chloride (K-DUR,KLOR-CON) 10 MEQ tablet Take 10 mEq by mouth every morning.    . raloxifene (EVISTA) 60 MG tablet Take 60 mg by mouth daily.  3  . rosuvastatin (CRESTOR) 5 MG tablet Take 2.5 mg by mouth every other day.    . valACYclovir (VALTREX) 500 MG tablet Take 500 mg by mouth daily as needed (for fever blisters).     [name] VITAMIN D, ERGOCALCIFEROL, PO Take 1,000 Units by mouth daily.      No current facility-administered medications for this visit.     Allergies:   Statins   Social History:  The patient  reports that she has never smoked. She has never used smokeless tobacco. She reports that she drinks alcohol. She reports that she does not use drugs.   Family History:  The patient's family history includes Alzheimer's disease in her mother; Heart failure in her father.    ROS:  Please see the history of present illness.   All other systems are reviewed and negative.    PHYSICAL EXAM: VS:  BP 128/80    Pulse 87   Ht 5\' 2"  (1.575 m)   Wt 141 lb 6.4 oz (64.1 kg)   LMP 02/04/1996   SpO2 90%   BMI 25.86 kg/m  , BMI Body mass index is 25.86 kg/m. GEN: Well nourished, well developed, in no acute distress  HEENT: normal  Neck: no JVD, carotid bruits, or masses Cardiac: irregular rate and rhythm; no murmurs, rubs, or gallops,no edema  Respiratory:  Decreased BS at R 1/2 way up, normal work of breathing GI: soft, nontender, nondistended, +  BS MS: no deformity or atrophy  Skin: warm and dry, device pocket is well healed Neuro:  Strength and sensation are intact Psych: euthymic mood, full affect  EKG:  EKG is ordered today. The ekg ordered today shows afib, V rate 87 bpm  Device interrogation is reviewed today in detail.  See PaceArt for details.   Recent Labs: 06/13/2015: BUN 18; Creatinine, Ser 0.97; Potassium 3.9; Sodium 136    Lipid Panel     Component Value Date/Time   CHOL  05/21/2007 0540    171        ATP III CLASSIFICATION:  <200     mg/dL   Desirable  200-239  mg/dL   Borderline High  >=240    mg/dL   High   TRIG 51 05/21/2007 0540   HDL 59 05/21/2007 0540   CHOLHDL 2.9 05/21/2007 0540   VLDL 10 05/21/2007 0540   LDLCALC (H) 05/21/2007 0540    102        Total Cholesterol/HDL:CHD Risk Coronary Heart Disease Risk Table                     Men   Women  1/2 Average Risk   3.4   3.3     Wt Readings from Last 3 Encounters:  12/31/15 141 lb 6.4 oz (64.1 kg)  11/05/15 136 lb (61.7 kg)  09/27/15 138 lb 9.6 oz (62.9 kg)    ASSESSMENT AND PLAN:  1.  Shortness of breath/ fatigue/ edema multifactoral She has had SOB with amiodarone.  She has a moderate R pleural effusion which returned after thoracentesis.  Fluid appeared transudative.  I have spoken with Dr Joylene Draft.  He and I agree that CV surgical consultation may be beneficial.  Would consider repeat chest CT (04/02/15 cardiac CT with pulmonary nodules seen and follow-up scan advised).  Increase lasix to 40mg  BID 2  gram sodium restriction  2. Atrial arrhythmias AF burden is 33% from 3% with reduced amiodarone.  Given SOB chronically and early cirrhosis, I think we need to stop amiodarone.  If AF worsens, may consider tikosyn or sotalol in the future.  3. Tachycardia/ bradycardia syndrome Normal pacemaker function See Pace Art report  4. HTN Stable No change required today  Very difficult situation.  I have discussed with Dr Joylene Draft today.  I will discuss with CV surgery to see if it is feasible to have CV consultation.  Return in 6 weeks  Signed, Thompson Grayer, MD  12/31/2015 10:13 PM     [place] [address] [phone] (office) [phone] (fax)

## 2015-12-31 NOTE — Patient Instructions (Addendum)
Medication Instructions:  Your physician has recommended you make the following change in your medication:  1) Increase Furosemide to 40 mg twice daily 2) Stop Amiodarone    Labwork: None ordered   Testing/Procedures: None ordered   Follow-Up: Your physician recommends that you schedule a follow-up appointment in: 6 weeks with Dr Rayann Heman    Low-Sodium Eating Plan Sodium raises blood pressure and causes water to be held in the body. Getting less sodium from food will help lower your blood pressure, reduce any swelling, and protect your heart, liver, and kidneys. We get sodium by adding salt (sodium chloride) to food. Most of our sodium comes from canned, boxed, and frozen foods. Restaurant foods, fast foods, and pizza are also very high in sodium. Even if you take medicine to lower your blood pressure or to reduce fluid in your body, getting less sodium from your food is important. What is my plan? Most people should limit their sodium intake to 2,300 mg a day. Your health care provider recommends that you limit your sodium intake to 2 grams a day. What do I need to know about this eating plan? For the low-sodium eating plan, you will follow these general guidelines:  Choose foods with a % Daily Value for sodium of less than 5% (as listed on the food label).  Use salt-free seasonings or herbs instead of table salt or sea salt.  Check with your health care provider or pharmacist before using salt substitutes.  Eat fresh foods.  Eat more vegetables and fruits.  Limit canned vegetables. If you do use them, rinse them well to decrease the sodium.  Limit cheese to 1 oz (28 g) per day.  Eat lower-sodium products, often labeled as "lower sodium" or "no salt added."  Avoid foods that contain monosodium glutamate (MSG). MSG is sometimes added to [place] food and some canned foods.  Check food labels (Nutrition Facts labels) on foods to learn how much sodium is in one serving.  Eat  more home-cooked food and less restaurant, buffet, and fast food.  When eating at a restaurant, ask that your food be prepared with less salt, or no salt if possible. How do I read food labels for sodium information? The Nutrition Facts label lists the amount of sodium in one serving of the food. If you eat more than one serving, you must multiply the listed amount of sodium by the number of servings. Food labels may also identify foods as:  Sodium free-Less than 5 mg in a serving.  Very low sodium-35 mg or less in a serving.  Low sodium-140 mg or less in a serving.  Light in sodium-50% less sodium in a serving. For example, if a food that usually has 300 mg of sodium is changed to become light in sodium, it will have 150 mg of sodium.  Reduced sodium-25% less sodium in a serving. For example, if a food that usually has 400 mg of sodium is changed to reduced sodium, it will have 300 mg of sodium. What foods can I eat? Grains  Low-sodium cereals, including oats, puffed wheat and rice, and shredded wheat cereals. Low-sodium crackers. Unsalted rice and pasta. Lower-sodium bread. Vegetables  Frozen or fresh vegetables. Low-sodium or reduced-sodium canned vegetables. Low-sodium or reduced-sodium tomato sauce and paste. Low-sodium or reduced-sodium tomato and vegetable juices. Fruits  Fresh, frozen, and canned fruit. Fruit juice. Meat and Other Protein Products  Low-sodium canned tuna and salmon. Fresh or frozen meat, poultry, seafood, and fish. Lamb. Unsalted  nuts. Dried beans, peas, and lentils without added salt. Unsalted canned beans. Homemade soups without salt. Eggs. Dairy  Milk. Soy milk. Ricotta cheese. Low-sodium or reduced-sodium cheeses. Yogurt. Condiments  Fresh and dried herbs and spices. Salt-free seasonings. Onion and garlic powders. Low-sodium varieties of mustard and ketchup. Fresh or refrigerated horseradish. Lemon juice. Fats and Oils  Reduced-sodium salad dressings.  Unsalted butter. Other  Unsalted popcorn and pretzels. The items listed above may not be a complete list of recommended foods or beverages. Contact your dietitian for more options.  What foods are not recommended? Grains  Instant hot cereals. Bread stuffing, pancake, and biscuit mixes. Croutons. Seasoned rice or pasta mixes. Noodle soup cups. Boxed or frozen macaroni and cheese. Self-rising flour. Regular salted crackers. Vegetables  Regular canned vegetables. Regular canned tomato sauce and paste. Regular tomato and vegetable juices. Frozen vegetables in sauces. Salted [place] fries. Olives. [name] [name]. Relishes. Sauerkraut. Salsa. Meat and Other Protein Products  Salted, canned, smoked, spiced, or pickled meats, seafood, or fish. Bacon, ham, sausage, hot dogs, corned beef, chipped beef, and packaged luncheon meats. Salt pork. Jerky. Pickled herring. Anchovies, regular canned tuna, and sardines. Salted nuts. Dairy  Processed cheese and cheese spreads. Cheese curds. Blue cheese and cottage cheese. Buttermilk. Condiments  Onion and garlic salt, seasoned salt, table salt, and sea salt. Canned and packaged gravies. Worcestershire sauce. Tartar sauce. Barbecue sauce. Teriyaki sauce. Soy sauce, including reduced sodium. Steak sauce. Fish sauce. Oyster sauce. Cocktail sauce. Horseradish that you find on the shelf. Regular ketchup and mustard. Meat flavorings and tenderizers. Bouillon cubes. Hot sauce. Tabasco sauce. Marinades. Taco seasonings. Relishes. Fats and Oils  Regular salad dressings. Salted butter. Margarine. Ghee. Bacon fat. Other  Potato and tortilla chips. Corn chips and puffs. Salted popcorn and pretzels. Canned or dried soups. Pizza. Frozen entrees and pot pies. The items listed above may not be a complete list of foods and beverages to avoid. Contact your dietitian for more information.  This information is not intended to replace advice given to you by your health care provider. Make sure  you discuss any questions you have with your health care provider. Document Released: 07/12/2001 Document Revised: 06/28/2015 Document Reviewed: 11/24/2012 Elsevier Interactive Patient Education  2017 [employer].

## 2016-01-01 ENCOUNTER — Telehealth: Payer: Self-pay | Admitting: Internal Medicine

## 2016-01-01 NOTE — Telephone Encounter (Signed)
Pt calling just to clarify medication instructions that were given to her by Dr Rayann Heman and RN, at yesterday's [place].  Pt wanted to clarify how much metoprolol succinate she should take daily, and how much lasix she should take daily.   Informed the pt that per Dr Rayann Heman and RN's OV note with the pt yesterday, she should do as mentioned below:  Your physician has recommended you make the following change in your medication:  1) Increase Furosemide to 40 mg twice daily 2) Stop Amiodarone  Advised the pt that she should continue her current regimen of Toprol XL, as already taking at 1 tablet (25 mg total) by mouth daily.   Pt also requesting for her furosemide to be sent to her local pharmacy of choice.  Informed the pt that [place], sent her lasix 40 mg po BID, to [facility] yesterday, for a 90 day supply.   Pt verbalized understanding of all instructions given, and agrees with this plan.  Pt gracious for all the assistance provided.

## 2016-01-01 NOTE — Telephone Encounter (Signed)
[name] [name] is calling because Dr.Allred has taken her off of the Amiodarone and she is still taking Metoprolol at night . She wants to know does he still wants her to continue taking one at night or take it both morning and night or stop it all taking it altogether. Please call .[name] Thanks

## 2016-01-02 ENCOUNTER — Other Ambulatory Visit: Payer: Self-pay | Admitting: Neurology

## 2016-01-07 DIAGNOSIS — K7460 Unspecified cirrhosis of liver: Secondary | ICD-10-CM | POA: Diagnosis not present

## 2016-01-14 ENCOUNTER — Encounter: Payer: Self-pay | Admitting: Internal Medicine

## 2016-01-14 DIAGNOSIS — M810 Age-related osteoporosis without current pathological fracture: Secondary | ICD-10-CM | POA: Diagnosis not present

## 2016-01-14 DIAGNOSIS — I10 Essential (primary) hypertension: Secondary | ICD-10-CM | POA: Diagnosis not present

## 2016-01-14 DIAGNOSIS — E038 Other specified hypothyroidism: Secondary | ICD-10-CM | POA: Diagnosis not present

## 2016-01-14 DIAGNOSIS — E784 Other hyperlipidemia: Secondary | ICD-10-CM | POA: Diagnosis not present

## 2016-01-14 DIAGNOSIS — R7301 Impaired fasting glucose: Secondary | ICD-10-CM | POA: Diagnosis not present

## 2016-01-21 DIAGNOSIS — Z950 Presence of cardiac pacemaker: Secondary | ICD-10-CM | POA: Diagnosis not present

## 2016-01-21 DIAGNOSIS — I509 Heart failure, unspecified: Secondary | ICD-10-CM | POA: Diagnosis not present

## 2016-01-21 DIAGNOSIS — Z6824 Body mass index (BMI) 24.0-24.9, adult: Secondary | ICD-10-CM | POA: Diagnosis not present

## 2016-01-21 DIAGNOSIS — D6489 Other specified anemias: Secondary | ICD-10-CM | POA: Diagnosis not present

## 2016-01-21 DIAGNOSIS — M810 Age-related osteoporosis without current pathological fracture: Secondary | ICD-10-CM | POA: Diagnosis not present

## 2016-01-21 DIAGNOSIS — J90 Pleural effusion, not elsewhere classified: Secondary | ICD-10-CM | POA: Diagnosis not present

## 2016-01-21 DIAGNOSIS — Z0000 Encounter for general adult medical examination without abnormal findings: Secondary | ICD-10-CM | POA: Diagnosis not present

## 2016-01-21 DIAGNOSIS — D649 Anemia, unspecified: Secondary | ICD-10-CM | POA: Diagnosis not present

## 2016-01-21 DIAGNOSIS — K7460 Unspecified cirrhosis of liver: Secondary | ICD-10-CM | POA: Diagnosis not present

## 2016-01-21 DIAGNOSIS — Z1389 Encounter for screening for other disorder: Secondary | ICD-10-CM | POA: Diagnosis not present

## 2016-01-21 DIAGNOSIS — R110 Nausea: Secondary | ICD-10-CM | POA: Diagnosis not present

## 2016-01-21 DIAGNOSIS — R0602 Shortness of breath: Secondary | ICD-10-CM | POA: Diagnosis not present

## 2016-01-21 DIAGNOSIS — G20 Parkinson's disease: Secondary | ICD-10-CM | POA: Diagnosis not present

## 2016-01-22 DIAGNOSIS — Z1212 Encounter for screening for malignant neoplasm of rectum: Secondary | ICD-10-CM | POA: Diagnosis not present

## 2016-02-05 NOTE — Progress Notes (Signed)
[name] [name] was seen today in the movement disorders clinic for neurologic consultation at the request of [name],[name], MD.  The consultation is for the evaluation of PD.  She has previously seen [name] and [name].  I have some of [name] notes.  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  She had a normal MRI brain at that time according to notes from Dr. love.  In 2011, her diagnosis was changed to Parkinson's disease and Azilect was added.  She had a second opinion at [facility] on 07/25/2009 and [name] also thought that she had Parkinson's disease.  In January, 2014 [name] added Artane, presumably for tremor.  She does think that it has helped the tremor.  02/22/13 update:  The pt is f/u today re: PD.  After our discussion last visit, she decided to stop the artane and states that while she noted a little increase in tremor, it hasn't been bad.  I had wanted to start her on carbidopa/levodopa 25/100 but she wanted to hold on that last vist.  Balance is good but she has felt more slow and stiff.  No falls.  No hallucinations.  No n/v.  No near syncope.  Not exercising faithfully.  06/20/13 update:  The pt is f/u today re: PD.  She was started on carbidopa/levodopa 25/100 last visit in January in addition to the azilect that she was already on.  She just returned from a trip from the [place] and was so surprised that she could do all of the planned excursions.  She felt great that she could do all the hiking and could get in and out of the [place]od[place] boat.  She states that she is so glad she started taking the levodopa and thinks that her mood is better.  Her coordination and tremor are better but she still has some tremor.  Some minor stomach upset.  Pharmacist told her to take it with food and she admits that she is taking it with protein.  She last took levodopa at 7:30 am and was examined at 10am.  She is having difficulty staying asleep.  10/13/13 update:  Pt is on carbidopa/levodopa 25/100  and is supposed to be on one tid but admits that she is only taking 1/2 po bid. Did this because of GI upset.   She is having more tremor especially when walking in the L hand; she is not sure if it is any particular time of day.  It doesn't bother her too much.  Some increased difficulty buttoning clothing.  Has a URI and is leaving for [place] on Friday.  She is going for 3 weeks.    02/13/14 update:  The patient returns today for follow-up.  She reports that she is not moving as fluidly as she previously was.  Last time I told her that she needed to really increase her medication to one tablet 3 times per day but she did not do that and she is only taking one twice a day; interestingly she gets the first and the middle of the day dosage in and forgets the last.  She does get nighttime (leg/toe) cramping.  She did have a ventral hernia repair on 11/24/2013.  States that she had a difficult time recovering as she had a hematoma after.  Could not walk after surgery and has not been back to exercise but knows that she needs to.  No falls.  Little tremor.    06/07/14 update:  The patient is f/u for PD.  She is still on carbidopa/levodopa 25/100 twice per day.  She did better clinically on tid dosing but backed down on it to bid on her own.   She states that "sometimes" she takes the third dose but she often is now missing the middle of the day dose.  Sometimes, she only takes it once a day.   She is walking well; no falls.  She is exercising but states that she just had a cortisone injection into the left knee so that was a little limited.  She is having an ablation for a-fib next tues.  She states that it will be done at [facility] and it is a 3 hr procedure (because it is internal not external).  No lightheadedness.  She is still on azilect faithfully.  She takes klonopin about 1-2 times per week for insomnia.    10/11/14 update:  The patient follows up today for Parkinson's disease.  Last visit, I encouraged her  to go back up on her carbidopa/levodopa from twice per day to 3 times per day as she was more stiff and rigid last visit.  She may or may not do that but finds that most days she takes it bid. She has had some toe curling but is unsure if it is related to timing of meds.   She remains on Azilect as well as clonazepam for her insomnia.  She reports that she sleeps with this but she admits she doesn't take it regularly.  After some discussion today, we realized that she acidentally was taking temazepam instead of the klonopin.    She reports that she had another cardiac ablation since last visit and thought that it originally didn't work but now she thinks that it is.    02/14/15 update:  The patient follows up today.  She is supposed to be on carbidopa/levodopa 25/100, 3 times a day, but she generally takes it twice a day.  She is on Azilect.  Since our last visit, she has started amiodarone.  She denies falls.  She denies hallucinations.  She denies lightheadedness or near syncope.  Her husband thinks that her voice isn't as good.  She has had some SOB and wonders if it isn't from the amiodarone (but thinks that she recently had some altitidue sickness as well skiing in the mountains); she is f/u with Dr. Rayann Heman and is to have a CXR.  She does think that SOB is getting better.  She has not been able to exercise as much because of the SOB.  [place] have moved to the new villas [place] [facility] [place].  She is having some trouble sleeping even with klonopin.    06/14/15 update:  The patient is following up today.  I have reviewed records since previous visit.  Last visit, encouraged her to take carbidopa/levodopa 25/100, 3 times per day, as she was often only taking it twice per day, with the second dosage at bedtime.  States that she still has trouble remembering to take it and she often only takes it once a day.   She remains on Azilect.  She is also on clonazepam for insomnia but is not using it faithfully because she  doesn't think that it helps.  Mood has been okay, but admits that it goes up and down depending on how she is doing medically.  Because atrial arrhythmias, she was started on amiodarone in November, 2016.  This was increased on 05/28/2015 to 200 mg twice  a day but it was decreased back to once per day yesterday.  Pt states that she has been so SOB and she was told that it was likely from the amiodarone.  Because of SOB, she hasn't been able to exercise.  She has had 3 falls.  States that with one she was walking and talking to someone behind her and tripped over a curb.  With one, she fell when an elevator door closed on her.  With one fall, her shoe came off and she tripped.  No hallucinations.    11/05/15 update:  The patient follows up today for her Parkinson's disease.  She has been noncompliant with levodopa dosing, and I again encouraged her last visit to take carbidopa/levodopa 25/100, one tablet 3 times per day.  She isn't doing this and is only taking one per day.   She states that she is doing well, however.  She had 2 falls since our last visit but states that during her PT screen she did well and wasn't considered a fall risk.  With the falls, she was outside on an uneven surface and just tripped.  She is on Azilect, 1 mg daily.  Remains on amiodarone (but had dx of PD prior to its use) but dose reduced at end of Aug by Dr. Rayann Heman and if she continues to do well, plans are to d/c that medication.  I reviewed cardiology records.  No falls since last visit.  Had a pleural effusion and subsequent thoracentesis.  No malignant cells on cytology.  States that she just had another xray with Dr. Joylene Draft.  Sleeping better than she was previously  02/06/16 update:  The patient follows up today.  She is on carbidopa/levodopa 25/100, one tablet three times per day.  She is more faithful about tid but the last one is at bedtime, as she states that it helps her relax.   She remains on Azilect, 1 mg daily.  The records  that were made available to me were reviewed.  Dr. Rayann Heman d/c her amiodarone at the end of November.  "I feel much better off of that medication."   2 falls by tripping on shoes.  Didn't get hurt.   Pt denies lightheadedness, near syncope.  No hallucinations.  Mood has been good.  Exercising some with PT exercises.    PREVIOUS MEDICATIONS: Azilect and artane  ALLERGIES:   Allergies  Allergen Reactions  . Statins     Leg weakness - but tolerating low dose Crestor every other day    CURRENT MEDICATIONS:  Current Outpatient Prescriptions on File Prior to Visit  Medication Sig Dispense Refill  . acetaminophen (TYLENOL) 500 MG tablet Take 1,000 mg by mouth every 4 (four) hours as needed for mild pain or headache.    . AZILECT 1 MG TABS tablet TAKE 1 TABLET (1 MG TOTAL) BY MOUTH DAILY.  3  . CALCIUM PO Take 1 tablet by mouth every morning.     . Calcium Polycarbophil (FIBERCON PO) Take 2 tablets by mouth every morning.     . carbidopa-levodopa (SINEMET IR) 25-100 MG tablet Take 1 tablet by mouth 3 (three) times daily. 90 tablet 5  . clonazePAM (KLONOPIN) 0.5 MG tablet TAKE 1 TABLET BY MOUTH AT BEDTIME 30 tablet 5  . Coenzyme Q10 (CO Q 10 PO) Take 1 tablet by mouth every morning.     Mariane Baumgarten Sodium (COLACE PO) Take 1 capsule by mouth 2 (two) times daily.     [name] ELIQUIS  5 MG TABS tablet Take 5 mg by mouth 2 (two) times daily.     [name] ezetimibe (ZETIA) 10 MG tablet Take 10 mg by mouth every other day. Taking the same day as the crestor    . furosemide (LASIX) 40 MG tablet Take 1 tablet (40 mg total) by mouth 2 (two) times daily. 180 tablet 3  . levothyroxine (SYNTHROID, LEVOTHROID) 100 MCG tablet Take 100 mcg by mouth daily.  2  . MAGNESIUM PO Take 1 tablet by mouth at bedtime.     . metoprolol succinate (TOPROL-XL) 25 MG 24 hr tablet Take 1 tablet (25 mg total) by mouth daily. 45 tablet 3  . multivitamin-lutein (OCUVITE-LUTEIN) CAPS capsule Take 1 capsule by mouth daily.    . potassium chloride  (K-DUR,KLOR-CON) 10 MEQ tablet Take 10 mEq by mouth every morning.    . raloxifene (EVISTA) 60 MG tablet Take 60 mg by mouth daily.  3  . rosuvastatin (CRESTOR) 5 MG tablet Take 2.5 mg by mouth every other day.    . valACYclovir (VALTREX) 500 MG tablet Take 500 mg by mouth daily as needed (for fever blisters).     [name] VITAMIN D, ERGOCALCIFEROL, PO Take 1,000 Units by mouth daily.      No current facility-administered medications on file prior to visit.     PAST MEDICAL HISTORY:   Past Medical History:  Diagnosis Date  . Arthritis    thumbs  . Atrial tachycardia ([place])   . Atypical atrial flutter ([place])   . Cataract    bilateral  . Chronic anticoagulation    on Xarelto  . Chronic diastolic CHF (congestive heart failure) (HCC)    Echocardiogram (11/24/12): Mild LVH, EF 60%.  [name] GERD (gastroesophageal reflux disease)   . Heart murmur    slight  . Hyperlipidemia    a. Hx of leg weakness while on statin. under control  . Hypertension   . Hypothyroidism   . Leg fracture Dec 21,2011  . Osteoporosis 07/2007  . Parkinson's disease ([place]) 2011  . Persistent atrial fibrillation (HCC)    s/p atrial fib ablation 11-11-11.   [name] Shortness of breath    "when walking at incline or stairs"  . Sinus brady-tachy syndrome ([place])   . Squamous carcinoma summer 2015   back of left thigh  . Wrist fracture    right    PAST SURGICAL HISTORY:   Past Surgical History:  Procedure Laterality Date  . ATRIAL FIBRILLATION ABLATION N/A 11/11/2011   Procedure: ATRIAL FIBRILLATION ABLATION;  Surgeon: Thompson Grayer, MD;  Location: [facility] CATH LAB;  Service: Cardiovascular;  Laterality: N/A;  . BREAST SURGERY     benign cyst removed  . CARDIAC ELECTROPHYSIOLOGY STUDY AND ABLATION  5/14, 7/14   convergent AF ablation at [facility] and subsequent atypical atrial flutter ablation by Dr Lehman Prom  . CARDIOVASCULAR STRESS TEST  06-06-2008   EF 73%  . CARDIOVERSION  09/09/2011   Procedure: CARDIOVERSION;  Surgeon: Darlin Coco,  MD;  Location: [facility] ENDOSCOPY;  Service: Cardiovascular;  Laterality: N/A;  to be done by dr. Mare Ferrari  . CARDIOVERSION  02/18/2012   Procedure: CARDIOVERSION;  Surgeon: Thayer Headings, MD;  Location: [place];  Service: Cardiovascular;  Laterality: N/A;  . CHOLECYSTECTOMY  1995  . FEMUR FRACTURE SURGERY  2011   metal pin in place  . FOREARM SURGERY Left 2010   "opened arm to drain it"  . INSERTION OF MESH N/A 11/24/2013   Procedure: INSERTION OF MESH;  Surgeon:  Michael Boston, MD;  Location: [facility];  Service: General;  Laterality: N/A;  . KNEE SURGERY Left 2001  . PACEMAKER PLACEMENT  10/20/12   MDT Advisa DR implanted by Dr Lehman Prom at [facility]   . SKIN CANCER DESTRUCTION  summer 2015  . TEE WITHOUT CARDIOVERSION  11/10/2011   Procedure: TRANSESOPHAGEAL ECHOCARDIOGRAM (TEE);  Surgeon: Thayer Headings, MD;  Location: [place];  Service: Cardiovascular;  Laterality: N/A;  . TONSILLECTOMY  as child  . US ECHOCARDIOGRAPHY  03-10-2008   Est EF 55-60%, Dr. Cathie Olden  . VENTRAL HERNIA REPAIR N/A 11/24/2013   Procedure: LAPAROSCOPIC VENTRAL HERNIA;  Surgeon: Michael Boston, MD;  Location: [facility];  Service: General;  Laterality: N/A;  . WRIST SURGERY Right ~2009   metal rod in place    SOCIAL HISTORY:   Social History   Social History  . Marital status: Married    Spouse name: N/A  . Number of children: N/A  . Years of education: N/A   Occupational History  . Not on file.   Social History Main Topics  . Smoking status: Never Smoker  . Smokeless tobacco: Never Used  . Alcohol use Yes     Comment: 1-2 glasses of wine daily  . Drug use: No  . Sexual activity: Yes    Partners: Male    Birth control/ protection: Post-menopausal   Other Topics Concern  . Not on file   Social History Narrative   Lives in [place].  Retired Licensed conveyancer.    FAMILY HISTORY:   Family Status  Relation Status  . Mother Deceased at age 68   AD   . Father Deceased at age 20   Valve-rheumatic fever as  child  . Child Alive   healthy  . Child Alive   healthy    ROS:  A complete 10 system review of systems was obtained and was unremarkable apart from what is mentioned above.  PHYSICAL EXAMINATION:    VITALS:   There were no vitals filed for this visit.  GEN:  The patient appears stated age and is in NAD. HEENT:  Normocephalic, atraumatic.  The mucous membranes are moist. The superficial temporal arteries are without ropiness or tenderness. CV: irrgegular rate Lungs:  CTAB  Neurological examination:  Orientation: The patient is alert and oriented x3. Fund of knowledge is appropriate.     Movement examination: Tone: There is normal tone  Abnormal movements:   There is intermittent tremor of the RUEwith ambulation only.  Mild dyskinesia. Coordination:  There is no decremation, with any form of RAMS, including alternating supination and pronation of the forearm, hand opening and closing, finger taps, heel taps and toe taps.  Gait and Station: The patient has no difficulty arising out of a deep-seated chair without the use of the hands. The patient's stride length is mildly decreased.  She has camptocormia to the right.  ASSESSMENT/PLAN:  1.  idiopathic tremor predominant Parkinson's disease  -continue carbidopa/levodopa 25/100 tid.  -She will remain on the Azilect.  -cardiology d/c amiodarone in 12/2015  -asked about rock steady boxing.  Information given.  Would need cardiology clearance. 2.  Insomnia.  -She has klonopin but doesn't take it faithfully as she doesn't think that it works.  Offered to try remeron but she states that she is doing better. 3.  Follow up in the next few months, sooner should new neurologic issues arise.   Much greater than 50% of this visit was spent in counseling with the patient and  the family.  Total face to face time:  25 min

## 2016-02-06 ENCOUNTER — Ambulatory Visit (INDEPENDENT_AMBULATORY_CARE_PROVIDER_SITE_OTHER): Payer: Medicare Other | Admitting: Neurology

## 2016-02-06 ENCOUNTER — Encounter: Payer: Self-pay | Admitting: Neurology

## 2016-02-06 VITALS — BP 116/70 | HR 90 | Ht 62.0 in | Wt 139.0 lb

## 2016-02-06 DIAGNOSIS — G20 Parkinson's disease: Secondary | ICD-10-CM | POA: Diagnosis not present

## 2016-02-21 ENCOUNTER — Encounter: Payer: Medicare Other | Admitting: Internal Medicine

## 2016-02-21 DIAGNOSIS — H938X3 Other specified disorders of ear, bilateral: Secondary | ICD-10-CM | POA: Diagnosis not present

## 2016-02-21 DIAGNOSIS — H6123 Impacted cerumen, bilateral: Secondary | ICD-10-CM | POA: Insufficient documentation

## 2016-02-21 DIAGNOSIS — H9113 Presbycusis, bilateral: Secondary | ICD-10-CM | POA: Insufficient documentation

## 2016-02-21 DIAGNOSIS — L299 Pruritus, unspecified: Secondary | ICD-10-CM | POA: Diagnosis not present

## 2016-02-27 ENCOUNTER — Encounter: Payer: Self-pay | Admitting: Internal Medicine

## 2016-02-27 ENCOUNTER — Encounter (INDEPENDENT_AMBULATORY_CARE_PROVIDER_SITE_OTHER): Payer: Self-pay

## 2016-02-27 ENCOUNTER — Ambulatory Visit (INDEPENDENT_AMBULATORY_CARE_PROVIDER_SITE_OTHER): Payer: Medicare Other | Admitting: Internal Medicine

## 2016-02-27 VITALS — BP 100/70 | HR 100 | Ht 62.0 in | Wt 139.8 lb

## 2016-02-27 DIAGNOSIS — I495 Sick sinus syndrome: Secondary | ICD-10-CM | POA: Diagnosis not present

## 2016-02-27 DIAGNOSIS — R0602 Shortness of breath: Secondary | ICD-10-CM | POA: Diagnosis not present

## 2016-02-27 DIAGNOSIS — I10 Essential (primary) hypertension: Secondary | ICD-10-CM | POA: Diagnosis not present

## 2016-02-27 DIAGNOSIS — I480 Paroxysmal atrial fibrillation: Secondary | ICD-10-CM | POA: Diagnosis not present

## 2016-02-27 LAB — CUP PACEART INCLINIC DEVICE CHECK
Battery Remaining Longevity: 30 mo
Brady Statistic AP VP Percent: 4.69 %
Brady Statistic AS VP Percent: 26.59 %
Brady Statistic RV Percent Paced: 31.55 %
Date Time Interrogation Session: 20180124154005
Implantable Lead Implant Date: 20140917
Implantable Lead Location: 753860
Lead Channel Impedance Value: 342 Ohm
Lead Channel Impedance Value: 437 Ohm
Lead Channel Pacing Threshold Pulse Width: 0.4 ms
Lead Channel Sensing Intrinsic Amplitude: 0.5 mV
Lead Channel Sensing Intrinsic Amplitude: 13.875 mV
Lead Channel Setting Pacing Amplitude: 2 V
Lead Channel Setting Pacing Amplitude: 2.5 V
Lead Channel Setting Sensing Sensitivity: 0.9 mV
MDC IDC LEAD IMPLANT DT: 20140917
MDC IDC LEAD LOCATION: 753859
MDC IDC MSMT BATTERY VOLTAGE: 2.97 V
MDC IDC MSMT LEADCHNL RA IMPEDANCE VALUE: 304 Ohm
MDC IDC MSMT LEADCHNL RA IMPEDANCE VALUE: 342 Ohm
MDC IDC MSMT LEADCHNL RV PACING THRESHOLD AMPLITUDE: 1 V
MDC IDC PG IMPLANT DT: 20140917
MDC IDC SET LEADCHNL RV PACING PULSEWIDTH: 0.4 ms
MDC IDC STAT BRADY AP VS PERCENT: 0.96 %
MDC IDC STAT BRADY AS VS PERCENT: 67.76 %
MDC IDC STAT BRADY RA PERCENT PACED: 4.83 %

## 2016-02-27 NOTE — Patient Instructions (Signed)

## 2016-02-27 NOTE — Progress Notes (Signed)
Electrophysiology Office Note   Date:  02/27/2016   ID:  [name] [name], DOB 06-Aug-1940, MRN [id number]  PCP:  Jerlyn Ly, MD  Cardiologist:  Dr Acie Fredrickson Primary Electrophysiologist: Thompson Grayer, MD    Chief Complaint  Patient presents with  . Paroxysmal Atrial Fibrillation     History of Present Illness: [name] [name] is a 76 y.o. female who presents today for electrophysiology follow-up. She has done much better since stopping amiodarone.  Though she is in afib all of the time, she feels "much better" currently.  Today, she denies symptoms of palpitations, chest pain, orthopnea, PND,claudication, dizziness, presyncope, syncope, bleeding, or neurologic sequela. The patient is tolerating medications without difficulties and is otherwise without complaint today.    Past Medical History:  Diagnosis Date  . Arthritis    thumbs  . Atrial tachycardia ([place])   . Atypical atrial flutter ([place])   . Cataract    bilateral  . Chronic anticoagulation    on Xarelto  . Chronic diastolic CHF (congestive heart failure) (HCC)    Echocardiogram (11/24/12): Mild LVH, EF 60%.  [name] GERD (gastroesophageal reflux disease)   . Heart murmur    slight  . Hyperlipidemia    a. Hx of leg weakness while on statin. under control  . Hypertension   . Hypothyroidism   . Leg fracture Dec 21,2011  . Osteoporosis 07/2007  . Parkinson's disease ([place]) 2011  . Persistent atrial fibrillation (HCC)    s/p atrial fib ablation 11-11-11.   [name] Shortness of breath    "when walking at incline or stairs"  . Sinus brady-tachy syndrome ([place])   . Squamous carcinoma summer 2015   back of left thigh  . Wrist fracture    right   Past Surgical History:  Procedure Laterality Date  . ATRIAL FIBRILLATION ABLATION N/A 11/11/2011   Procedure: ATRIAL FIBRILLATION ABLATION;  Surgeon: Thompson Grayer, MD;  Location: [facility] CATH LAB;  Service: Cardiovascular;  Laterality: N/A;  . BREAST SURGERY     benign cyst removed  . CARDIAC  ELECTROPHYSIOLOGY STUDY AND ABLATION  5/14, 7/14   convergent AF ablation at [facility] and subsequent atypical atrial flutter ablation by Dr Lehman Prom  . CARDIOVASCULAR STRESS TEST  06-06-2008   EF 73%  . CARDIOVERSION  09/09/2011   Procedure: CARDIOVERSION;  Surgeon: Darlin Coco, MD;  Location: [facility] ENDOSCOPY;  Service: Cardiovascular;  Laterality: N/A;  to be done by dr. Mare Ferrari  . CARDIOVERSION  02/18/2012   Procedure: CARDIOVERSION;  Surgeon: Thayer Headings, MD;  Location: [place];  Service: Cardiovascular;  Laterality: N/A;  . CHOLECYSTECTOMY  1995  . FEMUR FRACTURE SURGERY  2011   metal pin in place  . FOREARM SURGERY Left 2010   "opened arm to drain it"  . INSERTION OF MESH N/A 11/24/2013   Procedure: INSERTION OF MESH;  Surgeon: Michael Boston, MD;  [facility];  Service: General;  Laterality: N/A;  . KNEE SURGERY Left 2001  . PACEMAKER PLACEMENT  10/20/12   MDT Advisa DR implanted by Dr Lehman Prom at [facility]   . SKIN CANCER DESTRUCTION  summer 2015  . TEE WITHOUT CARDIOVERSION  11/10/2011   Procedure: TRANSESOPHAGEAL ECHOCARDIOGRAM (TEE);  Surgeon: Thayer Headings, MD;  Location: [place];  Service: Cardiovascular;  Laterality: N/A;  . TONSILLECTOMY  as child  . US ECHOCARDIOGRAPHY  03-10-2008   Est EF 55-60%, Dr. Cathie Olden  . VENTRAL HERNIA REPAIR N/A 11/24/2013   Procedure: LAPAROSCOPIC VENTRAL HERNIA;  Surgeon: Michael Boston, MD;  [facility];  Service: General;  Laterality: N/A;  . WRIST SURGERY Right ~2009   metal rod in place     Current Outpatient Prescriptions  Medication Sig Dispense Refill  . acetaminophen (TYLENOL) 500 MG tablet Take 1,000 mg by mouth every 4 (four) hours as needed for mild pain or headache.    . AZILECT 1 MG TABS tablet TAKE 1 TABLET (1 MG TOTAL) BY MOUTH DAILY.  3  . CALCIUM PO Take 1 tablet by mouth every morning.     . Calcium Polycarbophil (FIBERCON PO) Take 2 tablets by mouth every morning.     . carbidopa-levodopa (SINEMET IR) 25-100 MG  tablet Take 1 tablet by mouth 3 (three) times daily. 90 tablet 5  . clonazePAM (KLONOPIN) 0.5 MG tablet TAKE 1 TABLET BY MOUTH AT BEDTIME 30 tablet 5  . Coenzyme Q10 (CO Q 10 PO) Take 1 tablet by mouth every morning.     Mariane Baumgarten Sodium (COLACE PO) Take 1 capsule by mouth 2 (two) times daily.     [name] ELIQUIS 5 MG TABS tablet Take 5 mg by mouth 2 (two) times daily.     [name] ezetimibe (ZETIA) 10 MG tablet Take 10 mg by mouth every other day. Taking the same day as the crestor    . furosemide (LASIX) 40 MG tablet Take 1 tablet (40 mg total) by mouth 2 (two) times daily. 180 tablet 3  . levothyroxine (SYNTHROID, LEVOTHROID) 100 MCG tablet Take 100 mcg by mouth daily.  2  . MAGNESIUM PO Take 1 tablet by mouth at bedtime.     . metoprolol succinate (TOPROL-XL) 25 MG 24 hr tablet Take 1 tablet (25 mg total) by mouth daily. 45 tablet 3  . multivitamin-lutein (OCUVITE-LUTEIN) CAPS capsule Take 1 capsule by mouth daily.    . potassium chloride (K-DUR,KLOR-CON) 10 MEQ tablet Take 10 mEq by mouth every morning.    . raloxifene (EVISTA) 60 MG tablet Take 60 mg by mouth daily.  3  . rosuvastatin (CRESTOR) 5 MG tablet Take 2.5 mg by mouth every other day.    . valACYclovir (VALTREX) 500 MG tablet Take 500 mg by mouth daily as needed (for fever blisters).     [name] VITAMIN D, ERGOCALCIFEROL, PO Take 1,000 Units by mouth daily.      No current facility-administered medications for this visit.     Allergies:   Statins   Social History:  The patient  reports that she has never smoked. She has never used smokeless tobacco. She reports that she drinks alcohol. She reports that she does not use drugs.   Family History:  The patient's family history includes Alzheimer's disease in her mother; Heart failure in her father.    ROS:  Please see the history of present illness.   All other systems are reviewed and negative.    PHYSICAL EXAM: VS:  BP 100/70   Pulse 100   Ht 5\' 2"  (1.575 m)   Wt 139 lb 12.8 oz (63.4 kg)    LMP 02/04/1996   SpO2 92%   BMI 25.57 kg/m  , BMI Body mass index is 25.57 kg/m. GEN: Well nourished, well developed, in no acute distress  HEENT: normal  Neck: no JVD, carotid bruits, or masses Cardiac: irregular rate and rhythm; no murmurs, rubs, or gallops,no edema  Respiratory:  Decreased BS at R 1/4 way up, normal work of breathing GI: soft, nontender, nondistended, + BS MS: no deformity or atrophy  Skin: warm and dry,  device pocket is well healed Neuro:  Strength and sensation are intact Psych: euthymic mood, full affect  EKG:  EKG is ordered today. The ekg ordered today shows afib   Device interrogation is reviewed today in detail.  See PaceArt for details.   Recent Labs: 06/13/2015: BUN 18; Creatinine, Ser 0.97; Potassium 3.9; Sodium 136    Lipid Panel     Component Value Date/Time   CHOL  05/21/2007 0540    171        ATP III CLASSIFICATION:  <200     mg/dL   Desirable  200-239  mg/dL   Borderline High  >=240    mg/dL   High   TRIG 51 05/21/2007 0540   HDL 59 05/21/2007 0540   CHOLHDL 2.9 05/21/2007 0540   VLDL 10 05/21/2007 0540   LDLCALC (H) 05/21/2007 0540    102        Total Cholesterol/HDL:CHD Risk Coronary Heart Disease Risk Table                     Men   Women  1/2 Average Risk   3.4   3.3     Wt Readings from Last 3 Encounters:  02/27/16 139 lb 12.8 oz (63.4 kg)  02/06/16 139 lb (63 kg)  12/31/15 141 lb 6.4 oz (64.1 kg)    ASSESSMENT AND PLAN:  1.  Shortness of breath/ fatigue/ edema Improved off of amiodarone No changes today  2. Atrial arrhythmias AF burden is 99% but she actually feels better. Could consider sotalol or tikosyn.  For now she wishes to make no changes Continue long term anticoagulation  3. Tachycardia/ bradycardia syndrome Normal pacemaker function See Pace Art report  4. HTN Stable No change required today  Return in 3 months  Signed, Thompson Grayer, MD  02/27/2016 10:40 AM     [facility] [address] [place] [address] [phone] (office) [phone] (fax)

## 2016-03-10 DIAGNOSIS — E038 Other specified hypothyroidism: Secondary | ICD-10-CM | POA: Diagnosis not present

## 2016-03-10 DIAGNOSIS — D6489 Other specified anemias: Secondary | ICD-10-CM | POA: Diagnosis not present

## 2016-03-10 DIAGNOSIS — D649 Anemia, unspecified: Secondary | ICD-10-CM | POA: Diagnosis not present

## 2016-03-12 ENCOUNTER — Other Ambulatory Visit: Payer: Self-pay | Admitting: Internal Medicine

## 2016-03-12 DIAGNOSIS — Z1231 Encounter for screening mammogram for malignant neoplasm of breast: Secondary | ICD-10-CM

## 2016-03-13 ENCOUNTER — Ambulatory Visit: Payer: Medicare Other | Admitting: Occupational Therapy

## 2016-03-13 ENCOUNTER — Ambulatory Visit: Payer: Medicare Other | Admitting: Physical Therapy

## 2016-03-13 ENCOUNTER — Ambulatory Visit: Payer: Medicare Other | Admitting: Speech Pathology

## 2016-03-13 ENCOUNTER — Ambulatory Visit: Payer: Medicare Other | Attending: Neurology | Admitting: Physical Therapy

## 2016-03-13 ENCOUNTER — Ambulatory Visit: Payer: Medicare Other

## 2016-03-13 DIAGNOSIS — R29818 Other symptoms and signs involving the nervous system: Secondary | ICD-10-CM | POA: Insufficient documentation

## 2016-03-13 DIAGNOSIS — R471 Dysarthria and anarthria: Secondary | ICD-10-CM

## 2016-03-13 DIAGNOSIS — R2689 Other abnormalities of gait and mobility: Secondary | ICD-10-CM

## 2016-03-13 NOTE — Therapy (Signed)
[facility] [address] [place], [place], [address] Phone: [phone]   Fax:  [phone]  Patient Details  Name: [name] [name] MRN: [id number] Date of Birth: [birth year] Referring Provider:  Crist Infante, MD  Encounter Date: 03/13/2016  Speech Therapy Parkinson's Disease Screen         Decibel Level today: 64.6dB  (WNL=70-72 dB), with sound level meter 30cm away from pt's mouth.  Sustained /a/ 66 dB. Pt reports "My voice is too quiet."   Pt denies swallowing difficulties: no complaints of coughing or choking, foods sticking with liquids or solids.  Recommend assessment for dysarthria to determine need for skilled SLP intervention to address functional deficits in communication.  Aliene Altes 03/13/2016, 9:58 AM  [facility] [address] [place], [place], [address] Phone: [phone]   Fax:  [phone]

## 2016-03-13 NOTE — Therapy (Signed)
-  [facility] [address] [place], [place], [address] Phone: [phone]   Fax:  [phone]  Patient Details  Name: [name] [name] MRN: [id number] Date of Birth: [birth year] Referring Provider:  Crist Infante, MD  Encounter Date: 03/13/2016 Occupational Therapy Parkinson's Disease Screen   Physical Performance Test item #2 (simulated eating):  14.47 sec  9-hole peg test:    RUE  25.53 sec        LUE  28.94 sec  3 button/ unbutton: 26.82  Change in ability to perform ADLs/IADLs:  Moderate micrographia noted, pt reports increased difficulty with fine motor coordination tasks.   Pt would benefit from occupational therapy evaluation due to  Decline in fine motor coordination for ADLs.    Kinleigh Nault 03/13/2016, 8:23 AM  [facility] [address] [place], [place], [address] Phone: [phone]   Fax:  [phone]

## 2016-03-13 NOTE — Therapy (Signed)
[facility] [address] [place], [place], [address] Phone: [phone]   Fax:  [phone]  Physical Therapy Parkinsons Screen  Patient Details  Name: [name] [name] MRN: [id number] Date of Birth: 11-Sep-1940 Referring Provider: Tat  Encounter Date: 03/13/2016    Past Medical History:  Diagnosis Date  . Arthritis    thumbs  . Atrial tachycardia ([place])   . Atypical atrial flutter ([place])   . Cataract    bilateral  . Chronic anticoagulation    on Xarelto  . Chronic diastolic CHF (congestive heart failure) (HCC)    Echocardiogram (11/24/12): Mild LVH, EF 60%.  [name] GERD (gastroesophageal reflux disease)   . Heart murmur    slight  . Hyperlipidemia    a. Hx of leg weakness while on statin. under control  . Hypertension   . Hypothyroidism   . Leg fracture Dec 21,2011  . Osteoporosis 07/2007  . Parkinson's disease ([place]) 2011  . Persistent atrial fibrillation (HCC)    s/p atrial fib ablation 11-11-11.   [name] Shortness of breath    "when walking at incline or stairs"  . Sinus brady-tachy syndrome ([place])   . Squamous carcinoma summer 2015   back of left thigh  . Wrist fracture    right    Past Surgical History:  Procedure Laterality Date  . ATRIAL FIBRILLATION ABLATION N/A 11/11/2011   Procedure: ATRIAL FIBRILLATION ABLATION;  Surgeon: Thompson Grayer, MD;  Location: [facility] CATH LAB;  Service: Cardiovascular;  Laterality: N/A;  . BREAST SURGERY     benign cyst removed  . CARDIAC ELECTROPHYSIOLOGY STUDY AND ABLATION  5/14, 7/14   convergent AF ablation at [facility] and subsequent atypical atrial flutter ablation by Dr Lehman Prom  . CARDIOVASCULAR STRESS TEST  06-06-2008   EF 73%  . CARDIOVERSION  09/09/2011   Procedure: CARDIOVERSION;  Surgeon: Darlin Coco, MD;  Location: [facility] ENDOSCOPY;  Service: Cardiovascular;  Laterality: N/A;  to be done by dr. Mare Ferrari  . CARDIOVERSION  02/18/2012   Procedure: CARDIOVERSION;  Surgeon: Thayer Headings, MD;  Location: [place];  Service: Cardiovascular;  Laterality: N/A;  . CHOLECYSTECTOMY  1995  . FEMUR FRACTURE SURGERY  2011   metal pin in place  . FOREARM SURGERY Left 2010   "opened arm to drain it"  . INSERTION OF MESH N/A 11/24/2013   Procedure: INSERTION OF MESH;  Surgeon: Michael Boston, MD;  [facility];  Service: General;  Laterality: N/A;  . KNEE SURGERY Left 2001  . PACEMAKER PLACEMENT  10/20/12   MDT Advisa DR implanted by Dr Lehman Prom at [facility]   . SKIN CANCER DESTRUCTION  summer 2015  . TEE WITHOUT CARDIOVERSION  11/10/2011   Procedure: TRANSESOPHAGEAL ECHOCARDIOGRAM (TEE);  Surgeon: Thayer Headings, MD;  Location: [place];  Service: Cardiovascular;  Laterality: N/A;  . TONSILLECTOMY  as child  . US ECHOCARDIOGRAPHY  03-10-2008   Est EF 55-60%, Dr. Cathie Olden  . VENTRAL HERNIA REPAIR N/A 11/24/2013   Procedure: LAPAROSCOPIC VENTRAL HERNIA;  Surgeon: Michael Boston, MD;  [facility];  Service: General;  Laterality: N/A;  . WRIST SURGERY Right ~2009   metal rod in place    There were no vitals filed for this visit.         [facility] PT Assessment - 03/13/16 0001      Balance Screen   Has the patient fallen in the past 6 months No     Standardized Balance Assessment   Standardized Balance Assessment Timed  Up and Go Test;Five Times Sit to Stand;10 meter walk test   Five times sit to stand comments  11.78 sec    (10/2015   10.26)   10 Meter Walk 3.84 ft/sec   (11/2015  3.37 ft/sec)     Timed Up and Go Test   Normal TUG (seconds) 8.5 sec  (11/2015 10.3 sec)      Two of the three measures have improved (gait velocity and Timed Up and Go) since her last PT assessment. Do not feel referral for PT Consult is indicated at this time.                                   Rexanne Mano, PT 03/13/2016, 12:58 PM  [facility] [address] [place], [place], [address] Phone:  [phone]   Fax:  [phone]  Name: Neil Benevides MRN: [id number] Date of Birth: 31-May-1940

## 2016-03-14 ENCOUNTER — Telehealth: Payer: Self-pay | Admitting: Neurology

## 2016-03-14 DIAGNOSIS — G20A1 Parkinson's disease without dyskinesia, without mention of fluctuations: Secondary | ICD-10-CM

## 2016-03-14 DIAGNOSIS — G20 Parkinson's disease: Secondary | ICD-10-CM

## 2016-03-14 NOTE — Telephone Encounter (Signed)
-----   Message from Hulda Marin, OT sent at 03/14/2016  1:55 PM EST ----- Regarding: OT/ ST referrals Dr. Karie Georges was seen for multi disciplinary PD screens yesterday. She can benefit from occupational therapy and speech therapy referrals due to a decline in functional coordination and speech volume. If you agree please make these referrals. Sincerely, Theone Murdoch, OTR/L

## 2016-03-31 ENCOUNTER — Ambulatory Visit (INDEPENDENT_AMBULATORY_CARE_PROVIDER_SITE_OTHER): Payer: Medicare Other | Admitting: *Deleted

## 2016-03-31 ENCOUNTER — Telehealth: Payer: Self-pay | Admitting: Cardiology

## 2016-03-31 DIAGNOSIS — I495 Sick sinus syndrome: Secondary | ICD-10-CM

## 2016-03-31 NOTE — Telephone Encounter (Signed)
LMOVM reminding pt to send remote transmission.   

## 2016-04-01 ENCOUNTER — Encounter: Payer: Self-pay | Admitting: Cardiology

## 2016-04-01 NOTE — Progress Notes (Signed)
Remote pacemaker transmission.   

## 2016-04-02 LAB — CUP PACEART REMOTE DEVICE CHECK
Date Time Interrogation Session: 20180228074822
Implantable Lead Location: 753859
Lead Channel Setting Pacing Amplitude: 2 V
Lead Channel Setting Pacing Amplitude: 2.5 V
Lead Channel Setting Pacing Pulse Width: 0.4 ms
Lead Channel Setting Sensing Sensitivity: 0.9 mV
MDC IDC LEAD IMPLANT DT: 20140917
MDC IDC LEAD IMPLANT DT: 20140917
MDC IDC LEAD LOCATION: 753860
MDC IDC PG IMPLANT DT: 20140917

## 2016-04-05 ENCOUNTER — Other Ambulatory Visit: Payer: Self-pay | Admitting: Neurology

## 2016-04-08 ENCOUNTER — Ambulatory Visit: Payer: Medicare Other

## 2016-04-08 ENCOUNTER — Ambulatory Visit: Payer: Medicare Other | Attending: Neurology | Admitting: Occupational Therapy

## 2016-04-16 ENCOUNTER — Ambulatory Visit
Admission: RE | Admit: 2016-04-16 | Discharge: 2016-04-16 | Disposition: A | Discharge status: Home / Self Care | Payer: Medicare Other | Source: Ambulatory Visit | Attending: Internal Medicine | Admitting: Internal Medicine

## 2016-04-16 DIAGNOSIS — Z1231 Encounter for screening mammogram for malignant neoplasm of breast: Secondary | ICD-10-CM | POA: Diagnosis not present

## 2016-04-23 ENCOUNTER — Other Ambulatory Visit: Payer: Self-pay | Admitting: *Deleted

## 2016-04-23 MED ORDER — METOPROLOL SUCCINATE ER 25 MG PO TB24: 25.0000 mg | ORAL_TABLET | Freq: Every day | ORAL | 2 refills | Status: DC

## 2016-05-01 ENCOUNTER — Ambulatory Visit: Payer: Medicare Other

## 2016-05-01 ENCOUNTER — Ambulatory Visit: Payer: Medicare Other | Admitting: Physical Therapy

## 2016-05-01 ENCOUNTER — Ambulatory Visit: Payer: Medicare Other | Admitting: Occupational Therapy

## 2016-05-27 DIAGNOSIS — M810 Age-related osteoporosis without current pathological fracture: Secondary | ICD-10-CM | POA: Diagnosis not present

## 2016-05-27 DIAGNOSIS — I509 Heart failure, unspecified: Secondary | ICD-10-CM | POA: Diagnosis not present

## 2016-05-27 DIAGNOSIS — G20 Parkinson's disease: Secondary | ICD-10-CM | POA: Diagnosis not present

## 2016-05-27 DIAGNOSIS — D649 Anemia, unspecified: Secondary | ICD-10-CM | POA: Diagnosis not present

## 2016-05-27 DIAGNOSIS — K7460 Unspecified cirrhosis of liver: Secondary | ICD-10-CM | POA: Diagnosis not present

## 2016-05-27 DIAGNOSIS — J90 Pleural effusion, not elsewhere classified: Secondary | ICD-10-CM | POA: Diagnosis not present

## 2016-05-27 DIAGNOSIS — I480 Paroxysmal atrial fibrillation: Secondary | ICD-10-CM | POA: Diagnosis not present

## 2016-05-27 DIAGNOSIS — I10 Essential (primary) hypertension: Secondary | ICD-10-CM | POA: Diagnosis not present

## 2016-05-27 DIAGNOSIS — R7301 Impaired fasting glucose: Secondary | ICD-10-CM | POA: Diagnosis not present

## 2016-05-27 DIAGNOSIS — Z6825 Body mass index (BMI) 25.0-25.9, adult: Secondary | ICD-10-CM | POA: Diagnosis not present

## 2016-05-27 DIAGNOSIS — Z950 Presence of cardiac pacemaker: Secondary | ICD-10-CM | POA: Diagnosis not present

## 2016-06-02 ENCOUNTER — Ambulatory Visit (INDEPENDENT_AMBULATORY_CARE_PROVIDER_SITE_OTHER): Payer: Medicare Other | Admitting: Internal Medicine

## 2016-06-02 VITALS — BP 120/70 | HR 83 | Ht 62.0 in | Wt 141.0 lb

## 2016-06-02 DIAGNOSIS — I10 Essential (primary) hypertension: Secondary | ICD-10-CM

## 2016-06-02 DIAGNOSIS — I481 Persistent atrial fibrillation: Secondary | ICD-10-CM | POA: Diagnosis not present

## 2016-06-02 DIAGNOSIS — I495 Sick sinus syndrome: Secondary | ICD-10-CM | POA: Diagnosis not present

## 2016-06-02 DIAGNOSIS — I480 Paroxysmal atrial fibrillation: Secondary | ICD-10-CM | POA: Diagnosis not present

## 2016-06-02 DIAGNOSIS — R0602 Shortness of breath: Secondary | ICD-10-CM

## 2016-06-02 DIAGNOSIS — I4819 Other persistent atrial fibrillation: Secondary | ICD-10-CM

## 2016-06-02 LAB — CUP PACEART INCLINIC DEVICE CHECK
Brady Statistic AP VP Percent: 3.68 %
Brady Statistic AS VP Percent: 26.78 %
Brady Statistic RA Percent Paced: 3.65 %
Brady Statistic RV Percent Paced: 30.59 %
Date Time Interrogation Session: 20180430112030
Implantable Lead Implant Date: 20140917
Implantable Lead Implant Date: 20140917
Implantable Lead Location: 753860
Lead Channel Impedance Value: 342 Ohm
Lead Channel Impedance Value: 361 Ohm
Lead Channel Impedance Value: 456 Ohm
Lead Channel Pacing Threshold Amplitude: 0.5 V
Lead Channel Pacing Threshold Pulse Width: 0.4 ms
Lead Channel Sensing Intrinsic Amplitude: 17.375 mV
Lead Channel Setting Pacing Amplitude: 2 V
Lead Channel Setting Sensing Sensitivity: 0.9 mV
MDC IDC LEAD LOCATION: 753859
MDC IDC MSMT BATTERY REMAINING LONGEVITY: 46 mo
MDC IDC MSMT BATTERY VOLTAGE: 2.99 V
MDC IDC MSMT LEADCHNL RA IMPEDANCE VALUE: 323 Ohm
MDC IDC MSMT LEADCHNL RA SENSING INTR AMPL: 0.5 mV
MDC IDC MSMT LEADCHNL RV PACING THRESHOLD AMPLITUDE: 1 V
MDC IDC MSMT LEADCHNL RV PACING THRESHOLD PULSEWIDTH: 0.4 ms
MDC IDC PG IMPLANT DT: 20140917
MDC IDC SET LEADCHNL RV PACING AMPLITUDE: 2.5 V
MDC IDC SET LEADCHNL RV PACING PULSEWIDTH: 0.4 ms
MDC IDC STAT BRADY AP VS PERCENT: 0.67 %
MDC IDC STAT BRADY AS VS PERCENT: 68.88 %

## 2016-06-02 NOTE — Progress Notes (Signed)
Electrophysiology Office Note   Date:  06/02/2016   ID:  [name] [name], DOB September 25, 1940, MRN [id number]  PCP:  Jerlyn Ly, MD  Cardiologist:  Dr Acie Fredrickson Primary Electrophysiologist: Thompson Grayer, MD    Chief Complaint  Patient presents with  . PAF (paroxysmal atrial fibrillation) (HCC)     History of Present Illness: [name] [name] is a 76 y.o. female who presents today for electrophysiology follow-up. She has done much better since stopping amiodarone.  She seems stable at this time.   Today, she denies symptoms of palpitations, chest pain, orthopnea, PND,claudication, dizziness, presyncope, syncope, bleeding, or neurologic sequela. The patient is tolerating medications without difficulties and is otherwise without complaint today.    Past Medical History:  Diagnosis Date  . Arthritis    thumbs  . Atrial tachycardia ([place])   . Atypical atrial flutter ([place])   . Cataract    bilateral  . Chronic anticoagulation    on Xarelto  . Chronic diastolic CHF (congestive heart failure) (HCC)    Echocardiogram (11/24/12): Mild LVH, EF 60%.  [name] [name] GERD (gastroesophageal reflux disease)   . Heart murmur    slight  . Hyperlipidemia    a. Hx of leg weakness while on statin. under control  . Hypertension   . Hypothyroidism   . Leg fracture Dec 21,2011  . Osteoporosis 07/2007  . Parkinson's disease ([place]) 2011  . Persistent atrial fibrillation (HCC)    s/p atrial fib ablation 11-11-11.   [name] [name] Shortness of breath    "when walking at incline or stairs"  . 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)   . Squamous carcinoma summer 2015   back of left thigh  . Wrist fracture    right   Past Surgical History:  Procedure Laterality Date  . ATRIAL FIBRILLATION ABLATION N/A 11/11/2011   Procedure: ATRIAL FIBRILLATION ABLATION;  Surgeon: Thompson Grayer, MD;  [facility] CATH LAB;  Service: Cardiovascular;  Laterality: N/A;  . BREAST BIOPSY  1998  . BREAST SURGERY     benign cyst removed  . CARDIAC  ELECTROPHYSIOLOGY STUDY AND ABLATION  5/14, 7/14   convergent AF ablation at [facility] and subsequent atypical atrial flutter ablation by Dr Lehman Prom  . CARDIOVASCULAR STRESS TEST  06-06-2008   EF 73%  . CARDIOVERSION  09/09/2011   Procedure: CARDIOVERSION;  Surgeon: Darlin Coco, MD;  Location: [facility] ENDOSCOPY;  Service: Cardiovascular;  Laterality: N/A;  to be done by dr. Mare Ferrari  . CARDIOVERSION  02/18/2012   Procedure: CARDIOVERSION;  Surgeon: Thayer Headings, MD;  Location: [place];  Service: Cardiovascular;  Laterality: N/A;  . CHOLECYSTECTOMY  1995  . FEMUR FRACTURE SURGERY  2011   metal pin in place  . FOREARM SURGERY Left 2010   "opened arm to drain it"  . INSERTION OF MESH N/A 11/24/2013   Procedure: INSERTION OF MESH;  Surgeon: Michael Boston, MD;  [facility];  Service: General;  Laterality: N/A;  . KNEE SURGERY Left 2001  . PACEMAKER PLACEMENT  10/20/12   MDT Advisa DR implanted by Dr Lehman Prom at [facility]   . SKIN CANCER DESTRUCTION  summer 2015  . TEE WITHOUT CARDIOVERSION  11/10/2011   Procedure: TRANSESOPHAGEAL ECHOCARDIOGRAM (TEE);  Surgeon: Thayer Headings, MD;  Location: [facility];  Service: Cardiovascular;  Laterality: N/A;  . TONSILLECTOMY  as child  . US ECHOCARDIOGRAPHY  03-10-2008   Est EF 55-60%, Dr. Cathie Olden  . VENTRAL HERNIA REPAIR N/A 11/24/2013   Procedure: LAPAROSCOPIC VENTRAL HERNIA;  Surgeon: Michael Boston, MD;  [facility];  Service: General;  Laterality: N/A;  . WRIST SURGERY Right ~2009   metal rod in place     Current Outpatient Prescriptions  Medication Sig Dispense Refill  . acetaminophen (TYLENOL) 500 MG tablet Take 1,000 mg by mouth every 4 (four) hours as needed for mild pain or headache.    . AZILECT 1 MG TABS tablet TAKE 1 TABLET (1 MG TOTAL) BY MOUTH DAILY.  3  . CALCIUM PO Take 1 tablet by mouth every morning.     . Calcium Polycarbophil (FIBERCON PO) Take 2 tablets by mouth every morning.     . carbidopa-levodopa (SINEMET IR) 25-100 MG  tablet TAKE 1 TABLET BY MOUTH 3 (THREE) TIMES DAILY. 90 tablet 5  . clonazePAM (KLONOPIN) 0.5 MG tablet TAKE 1 TABLET BY MOUTH AT BEDTIME 30 tablet 5  . Coenzyme Q10 (CO Q 10 PO) Take 1 tablet by mouth every morning.     [name] [name] Sodium (COLACE PO) Take 1 capsule by mouth 2 (two) times daily.     [name] [name] ELIQUIS 5 MG TABS tablet Take 5 mg by mouth 2 (two) times daily.     [name] [name] ezetimibe (ZETIA) 10 MG tablet Take 10 mg by mouth every other day. Taking the same day as the crestor    . furosemide (LASIX) 40 MG tablet Take 1 tablet (40 mg total) by mouth 2 (two) times daily. 180 tablet 3  . levothyroxine (SYNTHROID, LEVOTHROID) 100 MCG tablet Take 100 mcg by mouth daily.  2  . MAGNESIUM PO Take 1 tablet by mouth at bedtime.     . metoprolol succinate (TOPROL-XL) 25 MG 24 hr tablet Take 1 tablet (25 mg total) by mouth daily. 90 tablet 2  . multivitamin-lutein (OCUVITE-LUTEIN) CAPS capsule Take 1 capsule by mouth daily.    . potassium chloride (K-DUR,KLOR-CON) 10 MEQ tablet Take 10 mEq by mouth every morning.    . raloxifene (EVISTA) 60 MG tablet Take 60 mg by mouth daily.  3  . rosuvastatin (CRESTOR) 5 MG tablet Take 2.5 mg by mouth every other day.    . valACYclovir (VALTREX) 500 MG tablet Take 500 mg by mouth daily as needed (for fever blisters).     [name] [name] VITAMIN D, ERGOCALCIFEROL, PO Take 1,000 Units by mouth daily.      No current facility-administered medications for this visit.     Allergies:   Statins   Social History:  The patient  reports that she has never smoked. She has never used smokeless tobacco. She reports that she drinks alcohol. She reports that she does not use drugs.   Family History:  The patient's family history includes Alzheimer's disease in her mother; Breast cancer in her maternal aunt; Heart failure in her father.    ROS:  Please see the history of present illness.   All other systems are reviewed and negative.    PHYSICAL EXAM: VS:  BP 120/70   Pulse 83   Ht 5\' 2"   (1.575 m)   Wt 141 lb (64 kg)   LMP 02/04/1996   SpO2 95%   BMI 25.79 kg/m  , BMI Body mass index is 25.79 kg/m. GEN: Well nourished, well developed, in no acute distress  HEENT: normal  Neck: no JVD, carotid bruits, or masses Cardiac: irregular rate and rhythm; 2/6 SEM LUSB early peaking, rubs, or gallops,no edema  Respiratory:  Decreased BS at R 1/4 way up (similar to prior), normal work of breathing GI: soft, nontender, nondistended, +  BS MS: no deformity or atrophy  Skin: warm and dry, device pocket is well healed Neuro:  Strength and sensation are intact Psych: euthymic mood, full affect  Device interrogation is personally reviewed today in detail.  See PaceArt for details.   Recent Labs: 06/13/2015: BUN 18; Creatinine, Ser 0.97; Potassium 3.9; Sodium 136    Lipid Panel     Component Value Date/Time   CHOL  05/21/2007 0540    171        ATP III CLASSIFICATION:  <200     mg/dL   Desirable  200-239  mg/dL   Borderline High  >=240    mg/dL   High   TRIG 51 05/21/2007 0540   HDL 59 05/21/2007 0540   CHOLHDL 2.9 05/21/2007 0540   VLDL 10 05/21/2007 0540   LDLCALC (H) 05/21/2007 0540    102        Total Cholesterol/HDL:CHD Risk Coronary Heart Disease Risk Table                     Men   Women  1/2 Average Risk   3.4   3.3     Wt Readings from Last 3 Encounters:  06/02/16 141 lb (64 kg)  02/27/16 139 lb 12.8 oz (63.4 kg)  02/06/16 139 lb (63 kg)    ASSESSMENT AND PLAN:  1.  Shortness of breath/ fatigue/ edema Improved off of amiodarone (stopped in November) No changes today  2. Atrial arrhythmias AF burden is 99% but she actually feels better. Today we discussed sotalol, tikosyn, and AV nodal ablation as options.  She is clear that she does not wish to make changes at this time. Continue long term anticoagulation Repeat echo upon return  3. Tachycardia/ bradycardia syndrome Normal pacemaker function See Pace Art report  4. HTN Stable No change  required today  5. R pleural effusion Stable Consider pulmonary referral  Return in 3 months  Signed, Thompson Grayer, MD  06/02/2016 10:12 AM     [facility] [address] [place] [address] [phone] (office) [phone] (fax)

## 2016-06-02 NOTE — Patient Instructions (Signed)
Medication Instructions:  Your physician recommends that you continue on your current medications as directed. Please refer to the Current Medication list given to you today.   Follow-Up: Your physician recommends that you schedule a follow-up appointment in: 3 MONTHS with Dr. Rayann Heman   Remote monitoring is used to monitor your Pacemaker from home. This monitoring reduces the number of office visits required to check your device to one time per year. It allows [place] to keep an eye on the functioning of your device to ensure it is working properly. You are scheduled for a device check from home on 09/01/16. You may send your transmission at any time that day. If you have a wireless device, the transmission will be sent automatically. After your physician reviews your transmission, you will receive a postcard with your next transmission date.     Any Other Special Instructions Will Be Listed Below (If Applicable).     If you need a refill on your cardiac medications before your next appointment, please call your pharmacy.

## 2016-06-03 DIAGNOSIS — H5213 Myopia, bilateral: Secondary | ICD-10-CM | POA: Diagnosis not present

## 2016-06-03 DIAGNOSIS — H353131 Nonexudative age-related macular degeneration, bilateral, early dry stage: Secondary | ICD-10-CM | POA: Diagnosis not present

## 2016-06-03 DIAGNOSIS — Z961 Presence of intraocular lens: Secondary | ICD-10-CM | POA: Diagnosis not present

## 2016-06-03 NOTE — Progress Notes (Signed)
[name] [name] was seen today in the movement disorders clinic for neurologic consultation at the request of [name],[name], MD.  The consultation is for the evaluation of PD.  She has previously seen [name] and [name].  I have some of [name] notes.  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  She had a normal MRI brain at that time according to notes from Dr. love.  In 2011, her diagnosis was changed to Parkinson's disease and Azilect was added.  She had a second opinion at [facility] on 07/25/2009 and [name] also thought that she had Parkinson's disease.  In January, 2014 [name] added Artane, presumably for tremor.  She does think that it has helped the tremor.  02/22/13 update:  The pt is f/u today re: PD.  After our discussion last visit, she decided to stop the artane and states that while she noted a little increase in tremor, it hasn't been bad.  I had wanted to start her on carbidopa/levodopa 25/100 but she wanted to hold on that last vist.  Balance is good but she has felt more slow and stiff.  No falls.  No hallucinations.  No n/v.  No near syncope.  Not exercising faithfully.  06/20/13 update:  The pt is f/u today re: PD.  She was started on carbidopa/levodopa 25/100 last visit in January in addition to the azilect that she was already on.  She just returned from a trip from the [place] and was so surprised that she could do all of the planned excursions.  She felt great that she could do all the hiking and could get in and out of the zodiac boat.  She states that she is so glad she started taking the levodopa and thinks that her mood is better.  Her coordination and tremor are better but she still has some tremor.  Some minor stomach upset.  Pharmacist told her to take it with food and she admits that she is taking it with protein.  She last took levodopa at 7:30 am and was examined at 10am.  She is having difficulty staying asleep.  10/13/13 update:  Pt is on carbidopa/levodopa 25/100  and is supposed to be on one tid but admits that she is only taking 1/2 po bid. Did this because of GI upset.   She is having more tremor especially when walking in the L hand; she is not sure if it is any particular time of day.  It doesn't bother her too much.  Some increased difficulty buttoning clothing.  Has a URI and is leaving for [place] on Friday.  She is going for 3 weeks.    02/13/14 update:  The patient returns today for follow-up.  She reports that she is not moving as fluidly as she previously was.  Last time I told her that she needed to really increase her medication to one tablet 3 times per day but she did not do that and she is only taking one twice a day; interestingly she gets the first and the middle of the day dosage in and forgets the last.  She does get nighttime (leg/toe) cramping.  She did have a ventral hernia repair on 11/24/2013.  States that she had a difficult time recovering as she had a hematoma after.  Could not walk after surgery and has not been back to exercise but knows that she needs to.  No falls.  Little tremor.    06/07/14 update:  The patient is f/u for PD.  She is still on carbidopa/levodopa 25/100 twice per day.  She did better clinically on tid dosing but backed down on it to bid on her own.   She states that "sometimes" she takes the third dose but she often is now missing the middle of the day dose.  Sometimes, she only takes it once a day.   She is walking well; no falls.  She is exercising but states that she just had a cortisone injection into the left knee so that was a little limited.  She is having an ablation for a-fib next tues.  She states that it will be done at [facility] and it is a 3 hr procedure (because it is internal not external).  No lightheadedness.  She is still on azilect faithfully.  She takes klonopin about 1-2 times per week for insomnia.    10/11/14 update:  The patient follows up today for Parkinson's disease.  Last visit, I encouraged her  to go back up on her carbidopa/levodopa from twice per day to 3 times per day as she was more stiff and rigid last visit.  She may or may not do that but finds that most days she takes it bid. She has had some toe curling but is unsure if it is related to timing of meds.   She remains on Azilect as well as clonazepam for her insomnia.  She reports that she sleeps with this but she admits she doesn't take it regularly.  After some discussion today, we realized that she acidentally was taking temazepam instead of the klonopin.    She reports that she had another cardiac ablation since last visit and thought that it originally didn't work but now she thinks that it is.    02/14/15 update:  The patient follows up today.  She is supposed to be on carbidopa/levodopa 25/100, 3 times a day, but she generally takes it twice a day.  She is on Azilect.  Since our last visit, she has started amiodarone.  She denies falls.  She denies hallucinations.  She denies lightheadedness or near syncope.  Her husband thinks that her voice isn't as good.  She has had some SOB and wonders if it isn't from the amiodarone (but thinks that she recently had some altitidue sickness as well skiing in the mountains); she is f/u with Dr. Rayann Heman and is to have a CXR.  She does think that SOB is getting better.  She has not been able to exercise as much because of the SOB.  They have moved to the new villas at [facility] [place].  She is having some trouble sleeping even with klonopin.    06/14/15 update:  The patient is following up today.  I have reviewed records since previous visit.  Last visit, encouraged her to take carbidopa/levodopa 25/100, 3 times per day, as she was often only taking it twice per day, with the second dosage at bedtime.  States that she still has trouble remembering to take it and she often only takes it once a day.   She remains on Azilect.  She is also on clonazepam for insomnia but is not using it faithfully because she  doesn't think that it helps.  Mood has been okay, but admits that it goes up and down depending on how she is doing medically.  Because atrial arrhythmias, she was started on amiodarone in November, 2016.  This was increased on 05/28/2015 to 200 mg twice  a day but it was decreased back to once per day yesterday.  Pt states that she has been so SOB and she was told that it was likely from the amiodarone.  Because of SOB, she hasn't been able to exercise.  She has had 3 falls.  States that with one she was walking and talking to someone behind her and tripped over a curb.  With one, she fell when an elevator door closed on her.  With one fall, her shoe came off and she tripped.  No hallucinations.    11/05/15 update:  The patient follows up today for her Parkinson's disease.  She has been noncompliant with levodopa dosing, and I again encouraged her last visit to take carbidopa/levodopa 25/100, one tablet 3 times per day.  She isn't doing this and is only taking one per day.   She states that she is doing well, however.  She had 2 falls since our last visit but states that during her PT screen she did well and wasn't considered a fall risk.  With the falls, she was outside on an uneven surface and just tripped.  She is on Azilect, 1 mg daily.  Remains on amiodarone (but had dx of PD prior to its use) but dose reduced at end of Aug by Dr. Rayann Heman and if she continues to do well, plans are to d/c that medication.  I reviewed cardiology records.  No falls since last visit.  Had a pleural effusion and subsequent thoracentesis.  No malignant cells on cytology.  States that she just had another xray with Dr. Joylene Draft.  Sleeping better than she was previously  02/06/16 update:  The patient follows up today.  She is on carbidopa/levodopa 25/100, one tablet three times per day.  She is more faithful about tid but the last one is at bedtime, as she states that it helps her relax.   She remains on Azilect, 1 mg daily.  The records  that were made available to me were reviewed.  Dr. Rayann Heman d/c her amiodarone at the end of November.  "I feel much better off of that medication."   2 falls by tripping on shoes.  Didn't get hurt.   Pt denies lightheadedness, near syncope.  No hallucinations.  Mood has been good.  Exercising some with PT exercises.    06/04/16 update:  Patient seen today in follow-up.  She is on carbidopa/levodopa 25/100, one tablet 3 times per day, but often times the last one is at bedtime.  She reports more cramping of the legs in the middle of the night.  She remains on Azilect, 1 mg daily.  She denies any falls.  She denies lightheadedness or near syncope.  No hallucinations.  Referrals were sent for the patient to attend Parkinson's therapy rehabilitation in February, but it looks like she was called and she did not call back to schedule.  PREVIOUS MEDICATIONS: Azilect and artane  ALLERGIES:   Allergies  Allergen Reactions  . Statins     Leg weakness - but tolerating low dose Crestor every other day    CURRENT MEDICATIONS:  Current Outpatient Prescriptions on File Prior to Visit  Medication Sig Dispense Refill  . acetaminophen (TYLENOL) 500 MG tablet Take 1,000 mg by mouth every 4 (four) hours as needed for mild pain or headache.    . AZILECT 1 MG TABS tablet TAKE 1 TABLET (1 MG TOTAL) BY MOUTH DAILY.  3  . CALCIUM PO Take 1 tablet by mouth every  morning.     . Calcium Polycarbophil (FIBERCON PO) Take 2 tablets by mouth every morning.     . carbidopa-levodopa (SINEMET IR) 25-100 MG tablet TAKE 1 TABLET BY MOUTH 3 (THREE) TIMES DAILY. 90 tablet 5  . clonazePAM (KLONOPIN) 0.5 MG tablet TAKE 1 TABLET BY MOUTH AT BEDTIME 30 tablet 5  . Coenzyme Q10 (CO Q 10 PO) Take 1 tablet by mouth every morning.     Mariane Baumgarten Sodium (COLACE PO) Take 1 capsule by mouth 2 (two) times daily.     [name] ELIQUIS 5 MG TABS tablet Take 5 mg by mouth 2 (two) times daily.     [name] ezetimibe (ZETIA) 10 MG tablet Take 10 mg by mouth every  other day. Taking the same day as the crestor    . furosemide (LASIX) 40 MG tablet Take 1 tablet (40 mg total) by mouth 2 (two) times daily. 180 tablet 3  . levothyroxine (SYNTHROID, LEVOTHROID) 100 MCG tablet Take 100 mcg by mouth daily.  2  . MAGNESIUM PO Take 1 tablet by mouth at bedtime.     . metoprolol succinate (TOPROL-XL) 25 MG 24 hr tablet Take 1 tablet (25 mg total) by mouth daily. 90 tablet 2  . potassium chloride (K-DUR,KLOR-CON) 10 MEQ tablet Take 10 mEq by mouth every morning.    . raloxifene (EVISTA) 60 MG tablet Take 60 mg by mouth daily.  3  . rosuvastatin (CRESTOR) 5 MG tablet Take 2.5 mg by mouth every other day.    . valACYclovir (VALTREX) 500 MG tablet Take 500 mg by mouth daily as needed (for fever blisters).     [name] VITAMIN D, ERGOCALCIFEROL, PO Take 1,000 Units by mouth daily.      No current facility-administered medications on file prior to visit.     PAST MEDICAL HISTORY:   Past Medical History:  Diagnosis Date  . Arthritis    thumbs  . Atrial tachycardia ([place])   . Atypical atrial flutter ([place])   . Cataract    bilateral  . Chronic anticoagulation    on Xarelto  . Chronic diastolic CHF (congestive heart failure) (HCC)    Echocardiogram (11/24/12): Mild LVH, EF 60%.  [name] GERD (gastroesophageal reflux disease)   . Heart murmur    slight  . Hyperlipidemia    a. Hx of leg weakness while on statin. under control  . Hypertension   . Hypothyroidism   . Leg fracture Dec 21,2011  . Osteoporosis 07/2007  . Parkinson's disease ([place]) 2011  . Persistent atrial fibrillation (HCC)    s/p atrial fib ablation 11-11-11.   [name] Shortness of breath    "when walking at incline or stairs"  . Sinus brady-tachy syndrome ([place])   . Squamous carcinoma summer 2015   back of left thigh  . Wrist fracture    right    PAST SURGICAL HISTORY:   Past Surgical History:  Procedure Laterality Date  . ATRIAL FIBRILLATION ABLATION N/A 11/11/2011   Procedure: ATRIAL FIBRILLATION ABLATION;   Surgeon: Thompson Grayer, MD;  Location: [facility] CATH LAB;  Service: Cardiovascular;  Laterality: N/A;  . BREAST BIOPSY  1998  . BREAST SURGERY     benign cyst removed  . CARDIAC ELECTROPHYSIOLOGY STUDY AND ABLATION  5/14, 7/14   convergent AF ablation at [facility] and subsequent atypical atrial flutter ablation by Dr Lehman Prom  . CARDIOVASCULAR STRESS TEST  06-06-2008   EF 73%  . CARDIOVERSION  09/09/2011   Procedure: CARDIOVERSION;  Surgeon: Darlin Coco, MD;  Location: [facility] ENDOSCOPY;  Service: Cardiovascular;  Laterality: N/A;  to be done by dr. Mare Ferrari  . CARDIOVERSION  02/18/2012   Procedure: CARDIOVERSION;  Surgeon: Thayer Headings, MD;  Location: [place];  Service: Cardiovascular;  Laterality: N/A;  . CHOLECYSTECTOMY  1995  . FEMUR FRACTURE SURGERY  2011   metal pin in place  . FOREARM SURGERY Left 2010   "opened arm to drain it"  . INSERTION OF MESH N/A 11/24/2013   Procedure: INSERTION OF MESH;  Surgeon: Michael Boston, MD;  Location: [facility] ORS;  Service: General;  Laterality: N/A;  . KNEE SURGERY Left 2001  . PACEMAKER PLACEMENT  10/20/12   MDT Advisa DR implanted by Dr Lehman Prom at [facility]   . SKIN CANCER DESTRUCTION  summer 2015  . TEE WITHOUT CARDIOVERSION  11/10/2011   Procedure: TRANSESOPHAGEAL ECHOCARDIOGRAM (TEE);  Surgeon: Thayer Headings, MD;  Location: [place];  Service: Cardiovascular;  Laterality: N/A;  . TONSILLECTOMY  as child  . US ECHOCARDIOGRAPHY  03-10-2008   Est EF 55-60%, Dr. Cathie Olden  . VENTRAL HERNIA REPAIR N/A 11/24/2013   Procedure: LAPAROSCOPIC VENTRAL HERNIA;  Surgeon: Michael Boston, MD;  Location: [facility] ORS;  Service: General;  Laterality: N/A;  . WRIST SURGERY Right ~2009   metal rod in place    SOCIAL HISTORY:   Social History   Social History  . Marital status: Married    Spouse name: N/A  . Number of children: N/A  . Years of education: N/A   Occupational History  . Not on file.   Social History Main Topics  . Smoking status: Never Smoker  . Smokeless  tobacco: Never Used  . Alcohol use Yes     Comment: 1-2 glasses of wine daily  . Drug use: No  . Sexual activity: Yes    Partners: Male    Birth control/ protection: Post-menopausal   Other Topics Concern  . Not on file   Social History Narrative   Lives in [place].  Retired Licensed conveyancer.    FAMILY HISTORY:   Family Status  Relation Status  . Mother Deceased at age 17   AD   . Father Deceased at age 19   Valve-rheumatic fever as child  . Child Alive   healthy  . Child Alive   healthy  . Maternal Aunt     ROS:  A complete 10 system review of systems was obtained and was unremarkable apart from what is mentioned above.  PHYSICAL EXAMINATION:    VITALS:   Vitals:   06/05/16 0809  BP: (!) 96/58  Pulse: 86  SpO2: 96%  Weight: 140 lb (63.5 kg)  Height: 5\' 2"  (1.575 m)    GEN:  The patient appears stated age and is in NAD. HEENT:  Normocephalic, atraumatic.  The mucous membranes are moist. The superficial temporal arteries are without ropiness or tenderness. CV: irrgegular rate Lungs:  CTAB  Neurological examination:  Orientation: The patient is alert and oriented x3. Fund of knowledge is appropriate.     Movement examination: Tone: There is normal tone  Abnormal movements:   There is intermittent tremor of the RUEwith ambulation only.  Mild dyskinesia in the L leg. Coordination:  There is no decremation, with any form of RAMS, including alternating supination and pronation of the forearm, hand opening and closing, finger taps, heel taps and toe taps.  Gait and Station: The patient has no difficulty arising out of a deep-seated chair without the use of the hands. The patient's stride  length is mildly decreased.  She has camptocormia to the right.  ASSESSMENT/PLAN:  1.  idiopathic tremor predominant Parkinson's disease  -continue carbidopa/levodopa 25/100 tid.  Still taking last at bedtime.  However, she is having cramping at nighttime.  Will add the CR q hs  (50/200) and told her again to move the others closer together in the day.    -She will remain on the Azilect.  -cardiology d/c amiodarone in 12/2015  -asked about rock steady boxing.  Information given.  Would need cardiology clearance. 2.  Insomnia.  -She has klonopin but doesn't take it faithfully as she doesn't think that it works.  Offered to try remeron but she states that she is doing better. 3.  Follow up in the next few months, sooner should new neurologic issues arise.   Much greater than 50% of this visit was spent in counseling with the patient and the family.  Total face to face time:  25 min

## 2016-06-05 ENCOUNTER — Ambulatory Visit (INDEPENDENT_AMBULATORY_CARE_PROVIDER_SITE_OTHER): Payer: Medicare Other | Admitting: Neurology

## 2016-06-05 ENCOUNTER — Encounter: Payer: Self-pay | Admitting: Neurology

## 2016-06-05 VITALS — BP 96/58 | HR 86 | Ht 62.0 in | Wt 140.0 lb

## 2016-06-05 DIAGNOSIS — G20 Parkinson's disease: Secondary | ICD-10-CM | POA: Diagnosis not present

## 2016-06-05 MED ORDER — CARBIDOPA-LEVODOPA ER 50-200 MG PO TBCR: 1.0000 | EXTENDED_RELEASE_TABLET | Freq: Every day | ORAL | 1 refills | Status: DC

## 2016-06-05 NOTE — Addendum Note (Signed)
Addended by: Annamaria Helling on: 06/05/2016 08:43 AM   Modules accepted: Orders

## 2016-06-17 DIAGNOSIS — Z85828 Personal history of other malignant neoplasm of skin: Secondary | ICD-10-CM | POA: Diagnosis not present

## 2016-06-17 DIAGNOSIS — L820 Inflamed seborrheic keratosis: Secondary | ICD-10-CM | POA: Diagnosis not present

## 2016-06-17 DIAGNOSIS — L821 Other seborrheic keratosis: Secondary | ICD-10-CM | POA: Diagnosis not present

## 2016-06-17 DIAGNOSIS — B0089 Other herpesviral infection: Secondary | ICD-10-CM | POA: Diagnosis not present

## 2016-06-17 DIAGNOSIS — L570 Actinic keratosis: Secondary | ICD-10-CM | POA: Diagnosis not present

## 2016-06-17 DIAGNOSIS — D225 Melanocytic nevi of trunk: Secondary | ICD-10-CM | POA: Diagnosis not present

## 2016-06-17 DIAGNOSIS — D1801 Hemangioma of skin and subcutaneous tissue: Secondary | ICD-10-CM | POA: Diagnosis not present

## 2016-06-18 ENCOUNTER — Other Ambulatory Visit: Payer: Self-pay | Admitting: Internal Medicine

## 2016-06-18 MED ORDER — METOPROLOL SUCCINATE ER 25 MG PO TB24: 25.0000 mg | ORAL_TABLET | Freq: Every day | ORAL | 3 refills | Status: DC

## 2016-06-23 ENCOUNTER — Other Ambulatory Visit: Payer: Self-pay | Admitting: Gastroenterology

## 2016-06-23 DIAGNOSIS — R748 Abnormal levels of other serum enzymes: Secondary | ICD-10-CM | POA: Diagnosis not present

## 2016-06-23 DIAGNOSIS — K760 Fatty (change of) liver, not elsewhere classified: Secondary | ICD-10-CM | POA: Diagnosis not present

## 2016-06-24 ENCOUNTER — Other Ambulatory Visit: Payer: Medicare Other

## 2016-06-25 DIAGNOSIS — M25562 Pain in left knee: Secondary | ICD-10-CM | POA: Diagnosis not present

## 2016-06-25 DIAGNOSIS — M1712 Unilateral primary osteoarthritis, left knee: Secondary | ICD-10-CM | POA: Diagnosis not present

## 2016-06-26 ENCOUNTER — Ambulatory Visit (HOSPITAL_COMMUNITY)
Admission: RE | Admit: 2016-06-26 | Discharge: 2016-06-26 | Disposition: A | Discharge status: Home / Self Care | Payer: Medicare Other | Source: Ambulatory Visit | Attending: Gastroenterology | Admitting: Gastroenterology

## 2016-06-26 DIAGNOSIS — Z9049 Acquired absence of other specified parts of digestive tract: Secondary | ICD-10-CM | POA: Insufficient documentation

## 2016-06-26 DIAGNOSIS — K760 Fatty (change of) liver, not elsewhere classified: Secondary | ICD-10-CM | POA: Diagnosis not present

## 2016-06-26 DIAGNOSIS — R932 Abnormal findings on diagnostic imaging of liver and biliary tract: Secondary | ICD-10-CM | POA: Insufficient documentation

## 2016-06-26 DIAGNOSIS — J90 Pleural effusion, not elsewhere classified: Secondary | ICD-10-CM | POA: Insufficient documentation

## 2016-07-03 ENCOUNTER — Other Ambulatory Visit: Payer: Self-pay | Admitting: Neurology

## 2016-07-04 NOTE — Telephone Encounter (Signed)
Left message on machine for patient to call back.

## 2016-07-04 NOTE — Telephone Encounter (Signed)
Could you call and clarify with patient as to how taking

## 2016-07-04 NOTE — Telephone Encounter (Signed)
Medication denied until we hear back from patient.

## 2016-07-04 NOTE — Telephone Encounter (Signed)
Last note states she doesn't take it faithfully because she doesn't think it works. Should I refill?

## 2016-07-07 ENCOUNTER — Other Ambulatory Visit: Payer: Self-pay

## 2016-07-07 NOTE — Telephone Encounter (Signed)
Pt returned call to the office.  She states that she takes her Klonopin "every night but not 7 nights a week"  She says that she still sleeps terribly when she takes it, but still takes it "pretty regularly".  Called in Rx for #30 with 3 refills.

## 2016-07-09 MED ORDER — CLONAZEPAM 0.5 MG PO TABS: 0.5000 mg | ORAL_TABLET | Freq: Every day | ORAL | 3 refills | Status: DC

## 2016-08-04 DIAGNOSIS — D509 Iron deficiency anemia, unspecified: Secondary | ICD-10-CM | POA: Diagnosis not present

## 2016-08-09 DIAGNOSIS — R350 Frequency of micturition: Secondary | ICD-10-CM | POA: Diagnosis not present

## 2016-08-18 ENCOUNTER — Other Ambulatory Visit: Payer: Self-pay | Admitting: Internal Medicine

## 2016-08-18 DIAGNOSIS — D649 Anemia, unspecified: Secondary | ICD-10-CM | POA: Diagnosis not present

## 2016-08-18 DIAGNOSIS — Z6826 Body mass index (BMI) 26.0-26.9, adult: Secondary | ICD-10-CM | POA: Diagnosis not present

## 2016-08-18 DIAGNOSIS — R300 Dysuria: Secondary | ICD-10-CM | POA: Diagnosis not present

## 2016-08-18 DIAGNOSIS — D259 Leiomyoma of uterus, unspecified: Secondary | ICD-10-CM | POA: Diagnosis not present

## 2016-08-18 DIAGNOSIS — K7460 Unspecified cirrhosis of liver: Secondary | ICD-10-CM | POA: Diagnosis not present

## 2016-08-18 DIAGNOSIS — R102 Pelvic and perineal pain: Secondary | ICD-10-CM

## 2016-08-19 ENCOUNTER — Ambulatory Visit
Admission: RE | Admit: 2016-08-19 | Discharge: 2016-08-19 | Disposition: A | Discharge status: Home / Self Care | Payer: Medicare Other | Source: Ambulatory Visit | Attending: Internal Medicine | Admitting: Internal Medicine

## 2016-08-19 DIAGNOSIS — K7460 Unspecified cirrhosis of liver: Secondary | ICD-10-CM | POA: Diagnosis not present

## 2016-08-19 DIAGNOSIS — R102 Pelvic and perineal pain: Secondary | ICD-10-CM

## 2016-08-19 DIAGNOSIS — R109 Unspecified abdominal pain: Secondary | ICD-10-CM | POA: Diagnosis not present

## 2016-08-19 MED ORDER — IOPAMIDOL (ISOVUE-300) INJECTION 61%
100.0000 mL | Freq: Once | INTRAVENOUS | Status: AC | PRN
  Administered 2016-08-19: 100 mL via INTRAVENOUS

## 2016-08-20 ENCOUNTER — Other Ambulatory Visit: Payer: Self-pay | Admitting: Internal Medicine

## 2016-08-20 DIAGNOSIS — R160 Hepatomegaly, not elsewhere classified: Secondary | ICD-10-CM

## 2016-08-26 ENCOUNTER — Other Ambulatory Visit (HOSPITAL_COMMUNITY): Payer: Self-pay | Admitting: *Deleted

## 2016-08-27 ENCOUNTER — Ambulatory Visit (HOSPITAL_COMMUNITY)
Admission: RE | Admit: 2016-08-27 | Discharge: 2016-08-27 | Disposition: A | Discharge status: Home / Self Care | Payer: Medicare Other | Source: Ambulatory Visit | Attending: Internal Medicine | Admitting: Internal Medicine

## 2016-08-27 DIAGNOSIS — D649 Anemia, unspecified: Secondary | ICD-10-CM | POA: Diagnosis not present

## 2016-08-27 MED ORDER — SODIUM CHLORIDE 0.9 % IV SOLN
510.0000 mg | INTRAVENOUS | Status: DC
  Administered 2016-08-27: 10:00:00 510 mg via INTRAVENOUS
  Filled 2016-08-27: qty 17

## 2016-08-28 DIAGNOSIS — G20 Parkinson's disease: Secondary | ICD-10-CM | POA: Diagnosis not present

## 2016-08-28 DIAGNOSIS — I509 Heart failure, unspecified: Secondary | ICD-10-CM | POA: Diagnosis not present

## 2016-08-28 DIAGNOSIS — K7460 Unspecified cirrhosis of liver: Secondary | ICD-10-CM | POA: Diagnosis not present

## 2016-08-28 DIAGNOSIS — R300 Dysuria: Secondary | ICD-10-CM | POA: Diagnosis not present

## 2016-08-28 DIAGNOSIS — I480 Paroxysmal atrial fibrillation: Secondary | ICD-10-CM | POA: Diagnosis not present

## 2016-08-28 DIAGNOSIS — Z6826 Body mass index (BMI) 26.0-26.9, adult: Secondary | ICD-10-CM | POA: Diagnosis not present

## 2016-08-28 DIAGNOSIS — K7689 Other specified diseases of liver: Secondary | ICD-10-CM | POA: Diagnosis not present

## 2016-08-28 DIAGNOSIS — D6489 Other specified anemias: Secondary | ICD-10-CM | POA: Diagnosis not present

## 2016-08-28 DIAGNOSIS — M25562 Pain in left knee: Secondary | ICD-10-CM | POA: Diagnosis not present

## 2016-08-28 DIAGNOSIS — J90 Pleural effusion, not elsewhere classified: Secondary | ICD-10-CM | POA: Diagnosis not present

## 2016-08-29 ENCOUNTER — Other Ambulatory Visit (HOSPITAL_COMMUNITY): Payer: Self-pay | Admitting: Internal Medicine

## 2016-08-29 DIAGNOSIS — R160 Hepatomegaly, not elsewhere classified: Secondary | ICD-10-CM

## 2016-09-01 ENCOUNTER — Telehealth: Payer: Self-pay | Admitting: Cardiology

## 2016-09-01 ENCOUNTER — Encounter: Payer: Medicare Other | Admitting: *Deleted

## 2016-09-01 NOTE — Telephone Encounter (Signed)
Spoke with pt and reminded pt of remote transmission that is due today. Pt verbalized understanding.   

## 2016-09-02 ENCOUNTER — Encounter (HOSPITAL_COMMUNITY): Payer: Medicare Other

## 2016-09-03 ENCOUNTER — Ambulatory Visit (INDEPENDENT_AMBULATORY_CARE_PROVIDER_SITE_OTHER): Payer: Medicare Other | Admitting: Internal Medicine

## 2016-09-03 ENCOUNTER — Encounter: Payer: Self-pay | Admitting: Internal Medicine

## 2016-09-03 ENCOUNTER — Ambulatory Visit (HOSPITAL_COMMUNITY)
Admission: RE | Admit: 2016-09-03 | Discharge: 2016-09-03 | Disposition: A | Discharge status: Home / Self Care | Payer: Medicare Other | Source: Ambulatory Visit | Attending: Internal Medicine | Admitting: Internal Medicine

## 2016-09-03 VITALS — BP 120/68 | HR 81 | Ht 62.0 in | Wt 144.0 lb

## 2016-09-03 DIAGNOSIS — I10 Essential (primary) hypertension: Secondary | ICD-10-CM | POA: Diagnosis not present

## 2016-09-03 DIAGNOSIS — I484 Atypical atrial flutter: Secondary | ICD-10-CM

## 2016-09-03 DIAGNOSIS — I481 Persistent atrial fibrillation: Secondary | ICD-10-CM | POA: Diagnosis not present

## 2016-09-03 DIAGNOSIS — R0602 Shortness of breath: Secondary | ICD-10-CM | POA: Diagnosis not present

## 2016-09-03 DIAGNOSIS — I4819 Other persistent atrial fibrillation: Secondary | ICD-10-CM

## 2016-09-03 DIAGNOSIS — D649 Anemia, unspecified: Secondary | ICD-10-CM | POA: Diagnosis not present

## 2016-09-03 DIAGNOSIS — I495 Sick sinus syndrome: Secondary | ICD-10-CM

## 2016-09-03 LAB — CUP PACEART INCLINIC DEVICE CHECK
Battery Remaining Longevity: 41 mo
Brady Statistic AP VS Percent: 0.25 %
Brady Statistic AS VS Percent: 75.34 %
Brady Statistic RV Percent Paced: 24.81 %
Date Time Interrogation Session: 20180801135514
Implantable Lead Location: 753860
Implantable Pulse Generator Implant Date: 20140917
Lead Channel Impedance Value: 342 Ohm
Lead Channel Impedance Value: 475 Ohm
Lead Channel Pacing Threshold Amplitude: 1 V
Lead Channel Sensing Intrinsic Amplitude: 0.375 mV
Lead Channel Sensing Intrinsic Amplitude: 0.375 mV
Lead Channel Sensing Intrinsic Amplitude: 13.5 mV
MDC IDC LEAD IMPLANT DT: 20140917
MDC IDC LEAD IMPLANT DT: 20140917
MDC IDC LEAD LOCATION: 753859
MDC IDC MSMT BATTERY VOLTAGE: 2.98 V
MDC IDC MSMT LEADCHNL RA IMPEDANCE VALUE: 380 Ohm
MDC IDC MSMT LEADCHNL RA PACING THRESHOLD AMPLITUDE: 0.625 V
MDC IDC MSMT LEADCHNL RA PACING THRESHOLD PULSEWIDTH: 0.4 ms
MDC IDC MSMT LEADCHNL RV IMPEDANCE VALUE: 380 Ohm
MDC IDC MSMT LEADCHNL RV PACING THRESHOLD PULSEWIDTH: 0.4 ms
MDC IDC MSMT LEADCHNL RV SENSING INTR AMPL: 16.375 mV
MDC IDC SET LEADCHNL RA PACING AMPLITUDE: 2 V
MDC IDC SET LEADCHNL RV PACING AMPLITUDE: 2.5 V
MDC IDC SET LEADCHNL RV PACING PULSEWIDTH: 0.4 ms
MDC IDC SET LEADCHNL RV SENSING SENSITIVITY: 0.9 mV
MDC IDC STAT BRADY AP VP PERCENT: 1.15 %
MDC IDC STAT BRADY AS VP PERCENT: 23.26 %
MDC IDC STAT BRADY RA PERCENT PACED: 1.17 %

## 2016-09-03 MED ORDER — SODIUM CHLORIDE 0.9 % IV SOLN
510.0000 mg | INTRAVENOUS | Status: AC
  Administered 2016-09-03: 12:00:00 510 mg via INTRAVENOUS
  Filled 2016-09-03: qty 17

## 2016-09-03 NOTE — Patient Instructions (Signed)
Medication Instructions:  Your physician recommends that you continue on your current medications as directed. Please refer to the Current Medication list given to you today.   Labwork: None Ordered   Testing/Procedures: None Ordered   Follow-Up: Your physician wants you to follow-up in: 6 months with Dr. Rayann Heman.  You will receive a reminder letter in the mail two months in advance. If you don't receive a letter, please call our office to schedule the follow-up appointment.   If you need a refill on your cardiac medications before your next appointment, please call your pharmacy.   Thank you for choosing [facility]! Christen Bame, RN [phone]

## 2016-09-03 NOTE — Progress Notes (Signed)
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 female who presents today for routine electrophysiology followup.  Since last being seen in our clinic, the patient reports doing very well.  She feels like "a new woman" since starting iron infusions.  She is very happy with her current health state. fatigue is resolved.  Still has some SOB.  Primarily limited by knee pain currently.  Today, she denies symptoms of palpitations, chest pain, lower extremity edema, dizziness, presyncope, or syncope.  The patient is otherwise without complaint today.   Past Medical History:  Diagnosis Date  . Arthritis    thumbs  . Atrial tachycardia ([place])   . Atypical atrial flutter ([place])   . Cataract    bilateral  . Chronic anticoagulation    on Xarelto  . Chronic diastolic CHF (congestive heart failure) (HCC)    Echocardiogram (11/24/12): Mild LVH, EF 60%.  [name] GERD (gastroesophageal reflux disease)   . Heart murmur    slight  . Hyperlipidemia    a. Hx of leg weakness while on statin. under control  . Hypertension   . Hypothyroidism   . Leg fracture Dec 21,2011  . Osteoporosis 07/2007  . Parkinson's disease ([place]) 2011  . Persistent atrial fibrillation (HCC)    s/p atrial fib ablation 11-11-11.   [name] Shortness of breath    "when walking at incline or stairs"  . Sinus brady-tachy syndrome ([place])   . Squamous carcinoma summer 2015   back of left thigh  . Wrist fracture    right   Past Surgical History:  Procedure Laterality Date  . ATRIAL FIBRILLATION ABLATION N/A 11/11/2011   Procedure: ATRIAL FIBRILLATION ABLATION;  Surgeon: Thompson Grayer, MD;  Location: [facility] CATH LAB;  Service: Cardiovascular;  Laterality: N/A;  . BREAST BIOPSY  1998  . BREAST SURGERY     benign cyst removed  . CARDIAC ELECTROPHYSIOLOGY STUDY AND ABLATION  5/14, 7/14   convergent AF ablation at [facility] and subsequent atypical atrial flutter ablation by Dr Lehman Prom  . CARDIOVASCULAR STRESS TEST  06-06-2008   EF 73%   . CARDIOVERSION  09/09/2011   Procedure: CARDIOVERSION;  Surgeon: Darlin Coco, MD;  Location: [facility] ENDOSCOPY;  Service: Cardiovascular;  Laterality: N/A;  to be done by dr. Mare Ferrari  . CARDIOVERSION  02/18/2012   Procedure: CARDIOVERSION;  Surgeon: Thayer Headings, MD;  Location: [place];  Service: Cardiovascular;  Laterality: N/A;  . CHOLECYSTECTOMY  1995  . FEMUR FRACTURE SURGERY  2011   metal pin in place  . FOREARM SURGERY Left 2010   "opened arm to drain it"  . INSERTION OF MESH N/A 11/24/2013   Procedure: INSERTION OF MESH;  Surgeon: Michael Boston, MD;  Location: [facility];  Service: General;  Laterality: N/A;  . KNEE SURGERY Left 2001  . PACEMAKER PLACEMENT  10/20/12   MDT Advisa DR implanted by Dr Lehman Prom at [facility]   . SKIN CANCER DESTRUCTION  summer 2015  . TEE WITHOUT CARDIOVERSION  11/10/2011   Procedure: TRANSESOPHAGEAL ECHOCARDIOGRAM (TEE);  Surgeon: Thayer Headings, MD;  Location: [place];  Service: Cardiovascular;  Laterality: N/A;  . TONSILLECTOMY  as child  . US ECHOCARDIOGRAPHY  03-10-2008   Est EF 55-60%, Dr. Cathie Olden  . VENTRAL HERNIA REPAIR N/A 11/24/2013   Procedure: LAPAROSCOPIC VENTRAL HERNIA;  Surgeon: Michael Boston, MD;  Location: [facility];  Service: General;  Laterality: N/A;  . WRIST SURGERY Right ~2009   metal rod in place  ROS- all systems are reviewed and negative except as per HPI above  Current Outpatient Prescriptions  Medication Sig Dispense Refill  . acetaminophen (TYLENOL) 500 MG tablet Take 1,000 mg by mouth every 4 (four) hours as needed for mild pain or headache.    . AZILECT 1 MG TABS tablet TAKE 1 TABLET (1 MG TOTAL) BY MOUTH DAILY.  3  . CALCIUM PO Take 1 tablet by mouth every morning.     . Calcium Polycarbophil (FIBERCON PO) Take 2 tablets by mouth every morning.     . carbidopa-levodopa (SINEMET CR) 50-200 MG tablet Take 1 tablet by mouth at bedtime. 90 tablet 1  . carbidopa-levodopa (SINEMET IR) 25-100 MG tablet TAKE 1 TABLET BY MOUTH 3  (THREE) TIMES DAILY. 90 tablet 5  . clonazePAM (KLONOPIN) 0.5 MG tablet Take 1 tablet (0.5 mg total) by mouth at bedtime. 30 tablet 3  . Coenzyme Q10 (CO Q 10 PO) Take 1 tablet by mouth every morning.     Mariane Baumgarten Sodium (COLACE PO) Take 1 capsule by mouth 2 (two) times daily.     [name] ELIQUIS 5 MG TABS tablet Take 5 mg by mouth 2 (two) times daily.     [name] ezetimibe (ZETIA) 10 MG tablet Take 10 mg by mouth every other day. Taking the same day as the crestor    . furosemide (LASIX) 40 MG tablet Take 1 tablet (40 mg total) by mouth 2 (two) times daily. 180 tablet 3  . levothyroxine (SYNTHROID, LEVOTHROID) 100 MCG tablet Take 100 mcg by mouth daily.  2  . MAGNESIUM PO Take 1 tablet by mouth at bedtime.     . metoprolol succinate (TOPROL-XL) 25 MG 24 hr tablet Take 1 tablet (25 mg total) by mouth daily. 90 tablet 3  . potassium chloride (K-DUR,KLOR-CON) 10 MEQ tablet Take 10 mEq by mouth every morning.    . raloxifene (EVISTA) 60 MG tablet Take 60 mg by mouth daily.  3  . rosuvastatin (CRESTOR) 5 MG tablet Take 2.5 mg by mouth every other day.    . valACYclovir (VALTREX) 500 MG tablet Take 500 mg by mouth daily as needed (for fever blisters).     [name] VITAMIN D, ERGOCALCIFEROL, PO Take 1,000 Units by mouth daily.      No current facility-administered medications for this visit.     Physical Exam: Vitals:   09/03/16 1009  BP: 120/68  Pulse: 81  Weight: 144 lb (65.3 kg)  Height: 5\' 2"  (1.575 m)    GEN- The patient is well appearing, alert and oriented x 3 today.   Head- normocephalic, atraumatic Eyes-  Sclera clear, conjunctiva pink Ears- hearing intact Oropharynx- clear Lungs- Clear to ausculation bilaterally, normal work of breathing Chest- pacemaker pocket is well healed Heart- Regular rate and rhythm, no murmurs, rubs or gallops, PMI not laterally displaced GI- soft, NT, ND, + BS Extremities- no clubbing, cyanosis, or edema  Pacemaker interrogation- reviewed in detail today,  See  PACEART report  ekg tracing ordered today is personally reviewed and shows afib with V pacing  Assessment and Plan:  1. Symptomatic bradycardia Normal pacemaker function See Pace Art report No changes today She has an MRI conditional device.  I have informed her that she can have MRIs when needed.  2. Persistent afib Doing well currently off AAD agents Continue longterm anticoagulation  3. Shortness of breath/ fatigue Much improved with iron infusions  4. HTN Stable No change required today  5. R pleural effusion  Near resolved on exam  [facility] Return in 6 months  Thompson Grayer MD, [facility] 09/03/2016 10:47 AM

## 2016-09-05 ENCOUNTER — Telehealth: Payer: Self-pay | Admitting: Pharmacist

## 2016-09-05 NOTE — Telephone Encounter (Signed)
-----   Message from Thompson Grayer, MD sent at 09/04/2016 11:29 PM EDT ----- Regarding: FW: US liver biopsy Megan,   Please assist using our protocol.  Thanks! JA  ----- Message ----- From: Tiajuana Amass, CMA Sent: 09/04/2016  11:33 AM To: Thompson Grayer, MD, Dionicio Stall, RN Subject: Melton Alar: US liver biopsy                              ----- Message ----- From: Alecia Lemming Sent: 0/10/9831  10:18 AM To: Crist Infante, MD, Tiajuana Amass, CMA Subject: FW: US liver biopsy                              ----- Message ----- From: Alecia Lemming Sent: 09/27/537   3:22 PM To: Crist Infante, MD Subject: US liver biopsy                                Patient is on Eliquis need to hold 2 days prior to biopsy on 09-09-16,patient is aware of her appt time&date. Need a note saying its okay to hold Eliquis 2 days prior starting on 09-07-16.Thanks Elmo Putt

## 2016-09-05 NOTE — Telephone Encounter (Signed)
Pt takes Eliquis for afib with CHADS2 score of 2 (HTN, HF) and CHADS2VASc score of 4 (age, sex, HTN, HF). Ok to hold Eliquis for 2 days prior to liver biopsy. Clearance routed to [employer].

## 2016-09-08 ENCOUNTER — Other Ambulatory Visit: Payer: Self-pay | Admitting: Student

## 2016-09-09 ENCOUNTER — Encounter (HOSPITAL_COMMUNITY): Payer: Self-pay

## 2016-09-09 ENCOUNTER — Ambulatory Visit (HOSPITAL_COMMUNITY)
Admission: RE | Admit: 2016-09-09 | Discharge: 2016-09-09 | Disposition: A | Discharge status: Home / Self Care | Payer: Medicare Other | Source: Ambulatory Visit | Attending: Internal Medicine | Admitting: Internal Medicine

## 2016-09-09 ENCOUNTER — Ambulatory Visit (HOSPITAL_COMMUNITY): Admission: RE | Admit: 2016-09-09 | Payer: Medicare Other | Source: Ambulatory Visit

## 2016-09-19 ENCOUNTER — Ambulatory Visit (HOSPITAL_COMMUNITY)
Admission: RE | Admit: 2016-09-19 | Discharge: 2016-09-19 | Disposition: A | Discharge status: Home / Self Care | Payer: Medicare Other | Source: Ambulatory Visit | Attending: Internal Medicine | Admitting: Internal Medicine

## 2016-09-19 ENCOUNTER — Encounter (HOSPITAL_COMMUNITY): Payer: Self-pay

## 2016-09-19 DIAGNOSIS — R160 Hepatomegaly, not elsewhere classified: Secondary | ICD-10-CM | POA: Insufficient documentation

## 2016-09-19 DIAGNOSIS — K7689 Other specified diseases of liver: Secondary | ICD-10-CM | POA: Diagnosis not present

## 2016-09-19 DIAGNOSIS — R932 Abnormal findings on diagnostic imaging of liver and biliary tract: Secondary | ICD-10-CM | POA: Insufficient documentation

## 2016-09-19 LAB — CREATININE, SERUM
CREATININE: 0.84 mg/dL (ref 0.44–1.00)
GFR calc Af Amer: >60 mL/min (ref >60)

## 2016-09-19 MED ORDER — GADOBENATE DIMEGLUMINE 529 MG/ML IV SOLN
15.0000 mL | Freq: Once | INTRAVENOUS | Status: AC
  Administered 2016-09-19: 13 mL via INTRAVENOUS

## 2016-10-06 ENCOUNTER — Other Ambulatory Visit: Payer: Self-pay | Admitting: Neurology

## 2016-10-07 NOTE — Progress Notes (Signed)
76 y.o. G2P2 MarriedCaucasianF here for annual exam.  Doing well.  Had iron infusions this summer.  Feeling more energetic because of this.  Seeing Dr. Rayann Heman every 4-6 months.  Seeing Dr. Carles Collet every six months.  Denies vaginal bleeding.  Reports some vulvar irritation she's like me to check out today.  Also reports some urinary pressure.  Had been checked 3 times for a UTI this summer with all the tests negative.    PCP:  Dr. Joylene Draft.  Has appt scheduled for next month.  Patient's last menstrual period was 02/04/1996.          Sexually active: Yes.    The current method of family planning is post menopausal status.    Exercising: Yes.    walking, stretching, light weights Smoker:  no  Health Maintenance: Pap:  05/29/14 negative, 05/24/13 negative,  History of abnormal Pap:  yes MMG:  04/16/16 BIRADS 1 negative, 3D  Colonoscopy:  2015- repeat 5 years  BMD:  04/2015 at Dr. Silvestre Mesi office  TDaP:  UTD  Pneumonia vaccine(s):  04/19/12  Zostavax:   PCP Hep C testing: not indicated  Screening Labs: PCP, Hb today: PCP, Urine today: PCP   reports that she has never smoked. She has never used smokeless tobacco. She reports that she drinks alcohol. She reports that she does not use drugs.  Past Medical History:  Diagnosis Date  . Arthritis    thumbs  . Atrial tachycardia ([place])   . Atypical atrial flutter ([place])   . Cataract    bilateral  . Chronic anticoagulation    on Xarelto  . Chronic diastolic CHF (congestive heart failure) (HCC)    Echocardiogram (11/24/12): Mild LVH, EF 60%.  [name] GERD (gastroesophageal reflux disease)   . Heart murmur    slight  . Hyperlipidemia    a. Hx of leg weakness while on statin. under control  . Hypertension   . Hypothyroidism   . Leg fracture Dec 21,2011  . Osteoporosis 07/2007  . Parkinson's disease ([place]) 2011  . Persistent atrial fibrillation (HCC)    s/p atrial fib ablation 11-11-11.   [name] Shortness of breath    "when walking at incline or stairs"  .  Sinus brady-tachy syndrome ([place])   . Squamous carcinoma summer 2015   back of left thigh  . Wrist fracture    right    Past Surgical History:  Procedure Laterality Date  . ATRIAL FIBRILLATION ABLATION N/A 11/11/2011   Procedure: ATRIAL FIBRILLATION ABLATION;  Surgeon: Thompson Grayer, MD;  Location: [facility] CATH LAB;  Service: Cardiovascular;  Laterality: N/A;  . BREAST BIOPSY  1998  . BREAST SURGERY     benign cyst removed  . CARDIAC ELECTROPHYSIOLOGY STUDY AND ABLATION  5/14, 7/14   convergent AF ablation at [facility] and subsequent atypical atrial flutter ablation by Dr Lehman Prom  . CARDIOVASCULAR STRESS TEST  06-06-2008   EF 73%  . CARDIOVERSION  09/09/2011   Procedure: CARDIOVERSION;  Surgeon: Darlin Coco, MD;  Location: [facility] ENDOSCOPY;  Service: Cardiovascular;  Laterality: N/A;  to be done by dr. Mare Ferrari  . CARDIOVERSION  02/18/2012   Procedure: CARDIOVERSION;  Surgeon: Thayer Headings, MD;  Location: [place];  Service: Cardiovascular;  Laterality: N/A;  . CHOLECYSTECTOMY  1995  . FEMUR FRACTURE SURGERY  2011   metal pin in place  . FOREARM SURGERY Left 2010   "opened arm to drain it"  . INSERTION OF MESH N/A 11/24/2013   Procedure: INSERTION OF MESH;  Surgeon:  Michael Boston, MD;  Location: [facility];  Service: General;  Laterality: N/A;  . KNEE SURGERY Left 2001  . PACEMAKER PLACEMENT  10/20/12   MDT Advisa DR implanted by Dr Lehman Prom [facility]   . SKIN CANCER DESTRUCTION  summer 2015  . TEE WITHOUT CARDIOVERSION  11/10/2011   Procedure: TRANSESOPHAGEAL ECHOCARDIOGRAM (TEE);  Surgeon: Thayer Headings, MD;  Location: [place];  Service: Cardiovascular;  Laterality: N/A;  . TONSILLECTOMY  as child  . US ECHOCARDIOGRAPHY  03-10-2008   Est EF 55-60%, Dr. Cathie Olden  . VENTRAL HERNIA REPAIR N/A 11/24/2013   Procedure: LAPAROSCOPIC VENTRAL HERNIA;  Surgeon: Michael Boston, MD;  Location: [facility];  Service: General;  Laterality: N/A;  . WRIST SURGERY Right ~2009   metal rod in place    Current  Outpatient Prescriptions  Medication Sig Dispense Refill  . acetaminophen (TYLENOL) 500 MG tablet Take 1,000 mg by mouth every 4 (four) hours as needed for mild pain or headache.    . AZILECT 1 MG TABS tablet TAKE 1 TABLET (1 MG TOTAL) BY MOUTH DAILY.  3  . CALCIUM PO Take 1 tablet by mouth every morning.     . Calcium Polycarbophil (FIBERCON PO) Take 2 tablets by mouth every morning.     . carbidopa-levodopa (SINEMET CR) 50-200 MG tablet Take 1 tablet by mouth at bedtime. 90 tablet 1  . carbidopa-levodopa (SINEMET IR) 25-100 MG tablet TAKE 1 TABLET BY MOUTH 3 (THREE) TIMES DAILY. 90 tablet 0  . clonazePAM (KLONOPIN) 0.5 MG tablet Take 1 tablet (0.5 mg total) by mouth at bedtime. 30 tablet 3  . Coenzyme Q10 (CO Q 10 PO) Take 1 tablet by mouth every morning.     Mariane Baumgarten Sodium (COLACE PO) Take 1 capsule by mouth 2 (two) times daily.     [name] ELIQUIS 5 MG TABS tablet Take 5 mg by mouth 2 (two) times daily.     [name] ezetimibe (ZETIA) 10 MG tablet Take 10 mg by mouth every other day. Taking the same day as the crestor    . furosemide (LASIX) 40 MG tablet Take 1 tablet (40 mg total) by mouth 2 (two) times daily. 180 tablet 3  . levothyroxine (SYNTHROID, LEVOTHROID) 100 MCG tablet Take 100 mcg by mouth daily.  2  . MAGNESIUM PO Take 1 tablet by mouth at bedtime.     . metoprolol succinate (TOPROL-XL) 25 MG 24 hr tablet Take 1 tablet (25 mg total) by mouth daily. 90 tablet 3  . potassium chloride (K-DUR,KLOR-CON) 10 MEQ tablet Take 10 mEq by mouth every morning.    . raloxifene (EVISTA) 60 MG tablet Take 60 mg by mouth daily.  3  . rosuvastatin (CRESTOR) 5 MG tablet Take 2.5 mg by mouth every other day.    . valACYclovir (VALTREX) 500 MG tablet Take 500 mg by mouth daily as needed (for fever blisters).     [name] VITAMIN D, ERGOCALCIFEROL, PO Take 1,000 Units by mouth daily.      No current facility-administered medications for this visit.     Family History  Problem Relation Age of Onset  . Alzheimer's  disease Mother   . Heart failure Father   . Breast cancer Maternal Aunt     ROS:  Pertinent items are noted in HPI.  Otherwise, a comprehensive ROS was negative.  Exam:   BP (!) 106/54 (BP Location: Right Arm, Patient Position: Sitting, Cuff Size: Normal)   Pulse 74   Resp 16  Ht 5\' 2"  (1.575 m)   Wt 143 lb (64.9 kg)   LMP 02/04/1996   BMI 26.16 kg/m     Height: 5\' 2"  (157.5 cm)  Ht Readings from Last 3 Encounters:  10/09/16 5\' 2"  (1.575 m)  09/03/16 5\' 2"  (1.575 m)  09/03/16 5\' 2"  (1.575 m)    General appearance: alert, cooperative and appears stated age Head: Normocephalic, without obvious abnormality, atraumatic Neck: no adenopathy, supple, symmetrical, trachea midline and thyroid normal to inspection and palpation Lungs: clear to auscultation bilaterally Breasts: normal appearance, no masses or tenderness Heart: regular rate and rhythm Abdomen: soft, non-tender; bowel sounds normal; no masses,  no organomegaly Extremities: extremities normal, atraumatic, no cyanosis or edema Skin: Skin color, texture, turgor normal. No rashes or lesions Lymph nodes: Cervical, supraclavicular, and axillary nodes normal. No abnormal inguinal nodes palpated Neurologic: Grossly normal   Pelvic: External genitalia:  no lesions, no abnormal changes              Urethra:  normal appearing urethra with no masses, tenderness or lesions              Bartholins and Skenes: normal                 Vagina: normal appearing vagina with normal color and discharge, no lesions              Cervix: no lesions              Pap taken: Yes.   Bimanual Exam:  Uterus:  normal size, contour, position, consistency, mobility, non-tender              Adnexa: normal adnexa and no mass, fullness, tenderness               Rectovaginal: Confirms               Anus:  normal sphincter tone, no lesions  Chaperone was present for exam.  A:  Well Woman with normal exam PMP, no HRT H/O afib, sick sinus syndrome,  chronic diastolic CHF followed by Dr. Rayann Heman Hypothyroidism Osteoporosis Parkinson's Disease  P:   Mammogram guidelines reviewed.  Doing 3D pap smear obtained today [place] .0.25% ointment BID up to 7 days.  If continues, recommended skin biopsy. Labs/vaccines UTD return annually or prn

## 2016-10-09 ENCOUNTER — Other Ambulatory Visit (HOSPITAL_COMMUNITY)
Admission: RE | Admit: 2016-10-09 | Discharge: 2016-10-09 | Disposition: A | Discharge status: Home / Self Care | Payer: Medicare Other | Source: Ambulatory Visit | Attending: Obstetrics & Gynecology | Admitting: Obstetrics & Gynecology

## 2016-10-09 ENCOUNTER — Encounter: Payer: Self-pay | Admitting: Obstetrics & Gynecology

## 2016-10-09 ENCOUNTER — Ambulatory Visit (INDEPENDENT_AMBULATORY_CARE_PROVIDER_SITE_OTHER): Payer: Medicare Other | Admitting: Neurology

## 2016-10-09 ENCOUNTER — Ambulatory Visit (INDEPENDENT_AMBULATORY_CARE_PROVIDER_SITE_OTHER): Payer: Medicare Other | Admitting: Obstetrics & Gynecology

## 2016-10-09 ENCOUNTER — Encounter: Payer: Self-pay | Admitting: Neurology

## 2016-10-09 VITALS — BP 114/70 | HR 90 | Ht 62.0 in | Wt 143.0 lb

## 2016-10-09 VITALS — BP 106/54 | HR 74 | Resp 16 | Ht 62.0 in | Wt 143.0 lb

## 2016-10-09 DIAGNOSIS — Z01419 Encounter for gynecological examination (general) (routine) without abnormal findings: Secondary | ICD-10-CM

## 2016-10-09 DIAGNOSIS — G20 Parkinson's disease: Secondary | ICD-10-CM

## 2016-10-09 DIAGNOSIS — G4709 Other insomnia: Secondary | ICD-10-CM | POA: Diagnosis not present

## 2016-10-09 DIAGNOSIS — Z124 Encounter for screening for malignant neoplasm of cervix: Secondary | ICD-10-CM | POA: Insufficient documentation

## 2016-10-09 MED ORDER — TRIAMCINOLONE ACETONIDE 0.025 % EX OINT: 1.0000 "application " | TOPICAL_OINTMENT | Freq: Two times a day (BID) | CUTANEOUS | 1 refills | Status: DC

## 2016-10-09 NOTE — Progress Notes (Signed)
[name] [name] was seen today in the movement disorders clinic for neurologic consultation at the request of Crist Infante, MD.  The consultation is for the evaluation of PD.  She has previously seen [name] and [name].  I have some of [name] notes.  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  She had a normal MRI brain at that time according to notes from Dr. love.  In 2011, her diagnosis was changed to Parkinson's disease and Azilect was added.  She had a second opinion at [facility] on 07/25/2009 and [name] also thought that she had Parkinson's disease.  In January, 2014 [name] added Artane, presumably for tremor.  She does think that it has helped the tremor.  02/22/13 update:  The pt is f/u today re: PD.  After our discussion last visit, she decided to stop the artane and states that while she noted a little increase in tremor, it hasn't been bad.  I had wanted to start her on carbidopa/levodopa 25/100 but she wanted to hold on that last vist.  Balance is good but she has felt more slow and stiff.  No falls.  No hallucinations.  No n/v.  No near syncope.  Not exercising faithfully.  06/20/13 update:  The pt is f/u today re: PD.  She was started on carbidopa/levodopa 25/100 last visit in January in addition to the azilect that she was already on.  She just returned from a trip from the [place] and was so surprised that she could do all of the planned excursions.  She felt great that she could do all the hiking and could get in and out of the zodiac boat.  She states that she is so glad she started taking the levodopa and thinks that her mood is better.  Her coordination and tremor are better but she still has some tremor.  Some minor stomach upset.  Pharmacist told her to take it with food and she admits that she is taking it with protein.  She last took levodopa at 7:30 am and was examined at 10am.  She is having difficulty staying asleep.  10/13/13 update:  Pt is on carbidopa/levodopa 25/100  and is supposed to be on one tid but admits that she is only taking 1/2 po bid. Did this because of GI upset.   She is having more tremor especially when walking in the L hand; she is not sure if it is any particular time of day.  It doesn't bother her too much.  Some increased difficulty buttoning clothing.  Has a URI and is leaving for [place] on Friday.  She is going for 3 weeks.    02/13/14 update:  The patient returns today for follow-up.  She reports that she is not moving as fluidly as she previously was.  Last time I told her that she needed to really increase her medication to one tablet 3 times per day but she did not do that and she is only taking one twice a day; interestingly she gets the first and the middle of the day dosage in and forgets the last.  She does get nighttime (leg/toe) cramping.  She did have a ventral hernia repair on 11/24/2013.  States that she had a difficult time recovering as she had a hematoma after.  Could not walk after surgery and has not been back to exercise but knows that she needs to.  No falls.  Little tremor.    06/07/14 update:  The patient is f/u for PD.  She is still on carbidopa/levodopa 25/100 twice per day.  She did better clinically on tid dosing but backed down on it to bid on her own.   She states that "sometimes" she takes the third dose but she often is now missing the middle of the day dose.  Sometimes, she only takes it once a day.   She is walking well; no falls.  She is exercising but states that she just had a cortisone injection into the left knee so that was a little limited.  She is having an ablation for a-fib next tues.  She states that it will be done at [facility] and it is a 3 hr procedure (because it is internal not external).  No lightheadedness.  She is still on azilect faithfully.  She takes klonopin about 1-2 times per week for insomnia.    10/11/14 update:  The patient follows up today for Parkinson's disease.  Last visit, I encouraged her  to go back up on her carbidopa/levodopa from twice per day to 3 times per day as she was more stiff and rigid last visit.  She may or may not do that but finds that most days she takes it bid. She has had some toe curling but is unsure if it is related to timing of meds.   She remains on Azilect as well as clonazepam for her insomnia.  She reports that she sleeps with this but she admits she doesn't take it regularly.  After some discussion today, we realized that she acidentally was taking temazepam instead of the klonopin.    She reports that she had another cardiac ablation since last visit and thought that it originally didn't work but now she thinks that it is.    02/14/15 update:  The patient follows up today.  She is supposed to be on carbidopa/levodopa 25/100, 3 times a day, but she generally takes it twice a day.  She is on Azilect.  Since our last visit, she has started amiodarone.  She denies falls.  She denies hallucinations.  She denies lightheadedness or near syncope.  Her husband thinks that her voice isn't as good.  She has had some SOB and wonders if it isn't from the amiodarone (but thinks that she recently had some altitidue sickness as well skiing in the mountains); she is f/u with Dr. Rayann Heman and is to have a CXR.  She does think that SOB is getting better.  She has not been able to exercise as much because of the SOB.  [place] have moved to the new villas at [facility] [place].  She is having some trouble sleeping even with klonopin.    06/14/15 update:  The patient is following up today.  I have reviewed records since previous visit.  Last visit, encouraged her to take carbidopa/levodopa 25/100, 3 times per day, as she was often only taking it twice per day, with the second dosage at bedtime.  States that she still has trouble remembering to take it and she often only takes it once a day.   She remains on Azilect.  She is also on clonazepam for insomnia but is not using it faithfully because she  doesn't think that it helps.  Mood has been okay, but admits that it goes up and down depending on how she is doing medically.  Because atrial arrhythmias, she was started on amiodarone in November, 2016.  This was increased on 05/28/2015 to 200 mg twice  a day but it was decreased back to once per day yesterday.  Pt states that she has been so SOB and she was told that it was likely from the amiodarone.  Because of SOB, she hasn't been able to exercise.  She has had 3 falls.  States that with one she was walking and talking to someone behind her and tripped over a curb.  With one, she fell when an elevator door closed on her.  With one fall, her shoe came off and she tripped.  No hallucinations.    11/05/15 update:  The patient follows up today for her Parkinson's disease.  She has been noncompliant with levodopa dosing, and I again encouraged her last visit to take carbidopa/levodopa 25/100, one tablet 3 times per day.  She isn't doing this and is only taking one per day.   She states that she is doing well, however.  She had 2 falls since our last visit but states that during her PT screen she did well and wasn't considered a fall risk.  With the falls, she was outside on an uneven surface and just tripped.  She is on Azilect, 1 mg daily.  Remains on amiodarone (but had dx of PD prior to its use) but dose reduced at end of Aug by Dr. Rayann Heman and if she continues to do well, plans are to d/c that medication.  I reviewed cardiology records.  No falls since last visit.  Had a pleural effusion and subsequent thoracentesis.  No malignant cells on cytology.  States that she just had another xray with Dr. Joylene Draft.  Sleeping better than she was previously  02/06/16 update:  The patient follows up today.  She is on carbidopa/levodopa 25/100, one tablet three times per day.  She is more faithful about tid but the last one is at bedtime, as she states that it helps her relax.   She remains on Azilect, 1 mg daily.  The records  that were made available to me were reviewed.  Dr. Rayann Heman d/c her amiodarone at the end of November.  "I feel much better off of that medication."   2 falls by tripping on shoes.  Didn't get hurt.   Pt denies lightheadedness, near syncope.  No hallucinations.  Mood has been good.  Exercising some with PT exercises.    06/04/16 update:  Patient seen today in follow-up.  She is on carbidopa/levodopa 25/100, one tablet 3 times per day, but often times the last one is at bedtime.  She reports more cramping of the legs in the middle of the night.  She remains on Azilect, 1 mg daily.  She denies any falls.  She denies lightheadedness or near syncope.  No hallucinations.  Referrals were sent for the patient to attend Parkinson's therapy rehabilitation in February, but it looks like she was called and she did not call back to schedule.  10/09/16 update:  Patient seen today in follow-up for Parkinson's disease.  She is on carbidopa/levodopa 25/100, one tablet 3 times per day.  Last visit, I added carbidopa/levodopa 50/200 at night.  She states that it didn't initially make a difference but her cramping is much better now.  She admits that she is taking medication more regularly.  She is still on Azilect.  No falls.  She has been better lately about exercise.  Her husband just had CABG 3 weeks ago.  Takes klonopin but not faithfully.    PREVIOUS MEDICATIONS: Azilect and artane  ALLERGIES:  Allergies  Allergen Reactions  . Statins     Leg weakness - but tolerating low dose Crestor every other day    CURRENT MEDICATIONS:  Current Outpatient Prescriptions on File Prior to Visit  Medication Sig Dispense Refill  . acetaminophen (TYLENOL) 500 MG tablet Take 1,000 mg by mouth every 4 (four) hours as needed for mild pain or headache.    . AZILECT 1 MG TABS tablet TAKE 1 TABLET (1 MG TOTAL) BY MOUTH DAILY.  3  . CALCIUM PO Take 1 tablet by mouth every morning.     . Calcium Polycarbophil (FIBERCON PO) Take 2 tablets  by mouth every morning.     . carbidopa-levodopa (SINEMET CR) 50-200 MG tablet Take 1 tablet by mouth at bedtime. 90 tablet 1  . carbidopa-levodopa (SINEMET IR) 25-100 MG tablet TAKE 1 TABLET BY MOUTH 3 (THREE) TIMES DAILY. 90 tablet 0  . clonazePAM (KLONOPIN) 0.5 MG tablet Take 1 tablet (0.5 mg total) by mouth at bedtime. 30 tablet 3  . Coenzyme Q10 (CO Q 10 PO) Take 1 tablet by mouth every morning.     Mariane Baumgarten Sodium (COLACE PO) Take 1 capsule by mouth 2 (two) times daily.     [name] ELIQUIS 5 MG TABS tablet Take 5 mg by mouth 2 (two) times daily.     [name] ezetimibe (ZETIA) 10 MG tablet Take 10 mg by mouth every other day. Taking the same day as the crestor    . furosemide (LASIX) 40 MG tablet Take 1 tablet (40 mg total) by mouth 2 (two) times daily. 180 tablet 3  . levothyroxine (SYNTHROID, LEVOTHROID) 100 MCG tablet Take 100 mcg by mouth daily.  2  . MAGNESIUM PO Take 1 tablet by mouth at bedtime.     . metoprolol succinate (TOPROL-XL) 25 MG 24 hr tablet Take 1 tablet (25 mg total) by mouth daily. 90 tablet 3  . potassium chloride (K-DUR,KLOR-CON) 10 MEQ tablet Take 10 mEq by mouth every morning.    . raloxifene (EVISTA) 60 MG tablet Take 60 mg by mouth daily.  3  . rosuvastatin (CRESTOR) 5 MG tablet Take 2.5 mg by mouth every other day.    . triamcinolone (KENALOG) 0.025 % ointment Apply 1 application topically 2 (two) times daily. Do not use for more than 5 days in a row. 30 g 1  . valACYclovir (VALTREX) 500 MG tablet Take 500 mg by mouth daily as needed (for fever blisters).     [name] VITAMIN D, ERGOCALCIFEROL, PO Take 1,000 Units by mouth daily.      No current facility-administered medications on file prior to visit.     PAST MEDICAL HISTORY:   Past Medical History:  Diagnosis Date  . Arthritis    thumbs  . Atrial tachycardia ([place])   . Atypical atrial flutter ([place])   . Cataract    bilateral  . Chronic anticoagulation    on Xarelto  . Chronic diastolic CHF (congestive heart failure)  (HCC)    Echocardiogram (11/24/12): Mild LVH, EF 60%.  [name] GERD (gastroesophageal reflux disease)   . Heart murmur    slight  . Hyperlipidemia    a. Hx of leg weakness while on statin. under control  . Hypertension   . Hypothyroidism   . Leg fracture Dec 21,2011  . Osteoporosis 07/2007  . Parkinson's disease ([place]) 2011  . Persistent atrial fibrillation (H[facility])    s/p atrial fib ablation 11-11-11.   [name] Shortness of breath    "when  walking at incline or stairs"  . Sinus brady-tachy syndrome ([place])   . Squamous carcinoma summer 2015   back of left thigh  . Wrist fracture    right    PAST SURGICAL HISTORY:   Past Surgical History:  Procedure Laterality Date  . ATRIAL FIBRILLATION ABLATION N/A 11/11/2011   Procedure: ATRIAL FIBRILLATION ABLATION;  Surgeon: Thompson Grayer, MD;  Location: [facility] CATH LAB;  Service: Cardiovascular;  Laterality: N/A;  . BREAST BIOPSY  1998  . BREAST SURGERY     benign cyst removed  . CARDIAC ELECTROPHYSIOLOGY STUDY AND ABLATION  5/14, 7/14   convergent AF ablation at [facility] and subsequent atypical atrial flutter ablation by Dr Lehman Prom  . CARDIOVASCULAR STRESS TEST  06-06-2008   EF 73%  . CARDIOVERSION  09/09/2011   Procedure: CARDIOVERSION;  Surgeon: Darlin Coco, MD;  Location: [facility] ENDOSCOPY;  Service: Cardiovascular;  Laterality: N/A;  to be done by dr. Mare Ferrari  . CARDIOVERSION  02/18/2012   Procedure: CARDIOVERSION;  Surgeon: Thayer Headings, MD;  Location: [place];  Service: Cardiovascular;  Laterality: N/A;  . CHOLECYSTECTOMY  1995  . FEMUR FRACTURE SURGERY  2011   metal pin in place  . FOREARM SURGERY Left 2010   "opened arm to drain it"  . INSERTION OF MESH N/A 11/24/2013   Procedure: INSERTION OF MESH;  Surgeon: [name] Boston, MD;  Location: [facility] ORS;  Service: General;  Laterality: N/A;  . KNEE SURGERY Left 2001  . PACEMAKER PLACEMENT  10/20/12   MDT Advisa DR implanted by Dr Lehman Prom [facility]   . SKIN CANCER DESTRUCTION  summer 2015  . TEE WITHOUT  CARDIOVERSION  11/10/2011   Procedure: TRANSESOPHAGEAL ECHOCARDIOGRAM (TEE);  Surgeon: Thayer Headings, MD;  Location: [place];  Service: Cardiovascular;  Laterality: N/A;  . TONSILLECTOMY  as child  . US ECHOCARDIOGRAPHY  03-10-2008   Est EF 55-60%, Dr. Cathie Olden  . VENTRAL HERNIA REPAIR N/A 11/24/2013   Procedure: LAPAROSCOPIC VENTRAL HERNIA;  Surgeon: [name] Boston, MD;  Location: [facility] ORS;  Service: General;  Laterality: N/A;  . WRIST SURGERY Right ~2009   metal rod in place    SOCIAL HISTORY:   Social History   Social History  . Marital status: Married    Spouse name: N/A  . Number of children: N/A  . Years of education: N/A   Occupational History  . Not on file.   Social History Main Topics  . Smoking status: Never Smoker  . Smokeless tobacco: Never Used  . Alcohol use Yes     Comment: 1-2 glasses of wine daily  . Drug use: No  . Sexual activity: Yes    Partners: Male    Birth control/ protection: Post-menopausal   Other Topics Concern  . Not on file   Social History Narrative   Lives in [place].  Retired Licensed conveyancer.    FAMILY HISTORY:   Family Status  Relation Status  . Mother Deceased at age 34       AD   . Father Deceased at age 32       Valve-rheumatic fever as child  . Child Alive       healthy  . Child Alive       healthy  . Mat Aunt (Not Specified)    ROS:  A complete 10 system review of systems was obtained and was unremarkable apart from what is mentioned above.  PHYSICAL EXAMINATION:    VITALS:   Vitals:   10/09/16 1258  BP:  114/70  Pulse: 90  SpO2: 96%  Weight: 143 lb (64.9 kg)  Height: 5\' 2"  (1.575 m)    GEN:  The patient appears stated age and is in NAD. HEENT:  Normocephalic, atraumatic.  The mucous membranes are moist. The superficial temporal arteries are without ropiness or tenderness. CV: irrgegular rate Lungs:  CTAB  Neurological examination:  Orientation: The patient is alert and oriented x3. Fund of knowledge is  appropriate.   CN's:  There is good facial symmetry.  EOMI.  Speech is fluent and clear.  Soft palate rises symmetrically and there is no tongue deviation.     Movement examination: Tone: There is normal tone  Abnormal movements:   There is intermittent tremor of the RUEwith ambulation only.  Mild dyskinesia in the L leg. Coordination:  There is mild decremation with finger taps and alternation of supination/pronation on the L Gait and Station: The patient has no difficulty arising out of a deep-seated chair without the use of the hands. The patient's stride length is good today.  She has re-emergent tremor on the L.  ASSESSMENT/PLAN:  1.  idiopathic tremor predominant Parkinson's disease  -continue carbidopa/levodopa 25/100 tid.  Encouraged to take regularly  -continue carbidopa/levodopa 50/200 at bed.  -She will remain on the Azilect.  -cardiology d/c amiodarone in 12/2015  -asked about rock steady boxing.  Information given.  Would need cardiology clearance. 2.  Insomnia.  -She has klonopin but doesn't take it faithfully as she doesn't think that it works.  Offered to try remeron but she states that she is doing better. 3.  Follow up in the next few months, sooner should new neurologic issues arise.

## 2016-10-10 LAB — CYTOLOGY - PAP: DIAGNOSIS: NEGATIVE

## 2016-10-15 DIAGNOSIS — I509 Heart failure, unspecified: Secondary | ICD-10-CM | POA: Diagnosis not present

## 2016-10-15 DIAGNOSIS — K7689 Other specified diseases of liver: Secondary | ICD-10-CM | POA: Diagnosis not present

## 2016-10-15 DIAGNOSIS — I10 Essential (primary) hypertension: Secondary | ICD-10-CM | POA: Diagnosis not present

## 2016-10-15 DIAGNOSIS — Z6826 Body mass index (BMI) 26.0-26.9, adult: Secondary | ICD-10-CM | POA: Diagnosis not present

## 2016-10-15 DIAGNOSIS — I480 Paroxysmal atrial fibrillation: Secondary | ICD-10-CM | POA: Diagnosis not present

## 2016-10-15 DIAGNOSIS — M1712 Unilateral primary osteoarthritis, left knee: Secondary | ICD-10-CM | POA: Diagnosis not present

## 2016-10-15 DIAGNOSIS — M25562 Pain in left knee: Secondary | ICD-10-CM | POA: Diagnosis not present

## 2016-10-15 DIAGNOSIS — D6489 Other specified anemias: Secondary | ICD-10-CM | POA: Diagnosis not present

## 2016-10-15 DIAGNOSIS — Z23 Encounter for immunization: Secondary | ICD-10-CM | POA: Diagnosis not present

## 2016-10-15 DIAGNOSIS — G20 Parkinson's disease: Secondary | ICD-10-CM | POA: Diagnosis not present

## 2016-10-16 DIAGNOSIS — I10 Essential (primary) hypertension: Secondary | ICD-10-CM | POA: Diagnosis not present

## 2016-10-16 DIAGNOSIS — D649 Anemia, unspecified: Secondary | ICD-10-CM | POA: Diagnosis not present

## 2016-10-20 DIAGNOSIS — M9903 Segmental and somatic dysfunction of lumbar region: Secondary | ICD-10-CM | POA: Diagnosis not present

## 2016-10-20 DIAGNOSIS — M25551 Pain in right hip: Secondary | ICD-10-CM | POA: Diagnosis not present

## 2016-10-20 DIAGNOSIS — M25552 Pain in left hip: Secondary | ICD-10-CM | POA: Diagnosis not present

## 2016-10-20 DIAGNOSIS — S32010A Wedge compression fracture of first lumbar vertebra, initial encounter for closed fracture: Secondary | ICD-10-CM | POA: Diagnosis not present

## 2016-10-20 DIAGNOSIS — M9905 Segmental and somatic dysfunction of pelvic region: Secondary | ICD-10-CM | POA: Diagnosis not present

## 2016-10-20 DIAGNOSIS — M9904 Segmental and somatic dysfunction of sacral region: Secondary | ICD-10-CM | POA: Diagnosis not present

## 2016-10-21 ENCOUNTER — Telehealth: Payer: Self-pay | Admitting: Neurology

## 2016-10-21 NOTE — Telephone Encounter (Signed)
Spoke with patient and made her aware.   For Parkinson's and surgery she recommends that you take Parkinson's medications with a sip of water that morning if able. If you need nausea medication that zofran is better than phenergan. Also, expect Parkinson's symptoms to get worse after surgery, but they will slowly get better.   Patient expressed understanding and will call if needed.

## 2016-10-21 NOTE — Telephone Encounter (Signed)
[name] [name] called regarding her having Partial Knee Replacement on October 15 th . She wanted to make sure Dr. Carles Collet had no concerns with her having it? Please Advise. Thanks

## 2016-10-22 ENCOUNTER — Telehealth: Payer: Self-pay | Admitting: Internal Medicine

## 2016-10-22 DIAGNOSIS — M9903 Segmental and somatic dysfunction of lumbar region: Secondary | ICD-10-CM | POA: Diagnosis not present

## 2016-10-22 DIAGNOSIS — M9905 Segmental and somatic dysfunction of pelvic region: Secondary | ICD-10-CM | POA: Diagnosis not present

## 2016-10-22 DIAGNOSIS — M25551 Pain in right hip: Secondary | ICD-10-CM | POA: Diagnosis not present

## 2016-10-22 DIAGNOSIS — S32010A Wedge compression fracture of first lumbar vertebra, initial encounter for closed fracture: Secondary | ICD-10-CM | POA: Diagnosis not present

## 2016-10-22 DIAGNOSIS — Q72811 Congenital shortening of right lower limb: Secondary | ICD-10-CM | POA: Diagnosis not present

## 2016-10-22 DIAGNOSIS — M9904 Segmental and somatic dysfunction of sacral region: Secondary | ICD-10-CM | POA: Diagnosis not present

## 2016-10-22 DIAGNOSIS — M25552 Pain in left hip: Secondary | ICD-10-CM | POA: Diagnosis not present

## 2016-10-22 NOTE — Telephone Encounter (Signed)
Walk In pt Form-patient Dropped off Closed Envelope. Placed in [place] box.

## 2016-10-27 DIAGNOSIS — M9905 Segmental and somatic dysfunction of pelvic region: Secondary | ICD-10-CM | POA: Diagnosis not present

## 2016-10-27 DIAGNOSIS — M25552 Pain in left hip: Secondary | ICD-10-CM | POA: Diagnosis not present

## 2016-10-27 DIAGNOSIS — M9903 Segmental and somatic dysfunction of lumbar region: Secondary | ICD-10-CM | POA: Diagnosis not present

## 2016-10-27 DIAGNOSIS — M25551 Pain in right hip: Secondary | ICD-10-CM | POA: Diagnosis not present

## 2016-10-27 DIAGNOSIS — Q72811 Congenital shortening of right lower limb: Secondary | ICD-10-CM | POA: Diagnosis not present

## 2016-10-27 DIAGNOSIS — S32010A Wedge compression fracture of first lumbar vertebra, initial encounter for closed fracture: Secondary | ICD-10-CM | POA: Diagnosis not present

## 2016-10-27 DIAGNOSIS — M9904 Segmental and somatic dysfunction of sacral region: Secondary | ICD-10-CM | POA: Diagnosis not present

## 2016-10-30 ENCOUNTER — Telehealth: Payer: Self-pay | Admitting: Neurology

## 2016-10-30 NOTE — Telephone Encounter (Signed)
Patient has talked to people with PD that got worse after surgery and thought this would be permanent. I did let her know it takes longer to recover and PD may get worse right after surgery but it isn't permanent.  She expressed understanding.

## 2016-10-30 NOTE — Telephone Encounter (Signed)
Pt called and wanted a call back to discuss an upcoming surgery she is going to have

## 2016-10-31 DIAGNOSIS — Z23 Encounter for immunization: Secondary | ICD-10-CM | POA: Diagnosis not present

## 2016-10-31 NOTE — H&P (Signed)
UNICOMPARTMENTAL KNEE ADMISSION H&P  Patient is being admitted for left medial unicompartmental knee arthroplasty.  Subjective:  Chief Complaint:  Left knee medial compartmental primary OA /pain    HPI: [name] [name], 76 y.o. female , has a history of pain and functional disability in the left and has failed non-surgical conservative treatments for greater than 12 weeks to include NSAID's and/or analgesics, corticosteriod injections and activity modification.  Onset of symptoms was gradual, starting 1+ years ago with gradually worsening course since that time. The patient noted prior procedures on the knee to include  arthroscopy and cartilage transfer surgery on the left knee(s).  Patient currently rates pain in the left knee(s) at 8 out of 10 with activity. Patient has worsening of pain with activity and weight bearing, pain that interferes with activities of daily living, pain with passive range of motion, crepitus and joint swelling.  Patient has evidence of periarticular osteophytes and joint space narrowing of the medial compartment by imaging studies.  There is no active infection.  Risks, benefits and expectations were discussed with the patient.  Risks including but not limited to the risk of anesthesia, blood clots, nerve damage, blood vessel damage, failure of the prosthesis, infection and up to and including death.  Patient understand the risks, benefits and expectations and wishes to proceed with surgery.   PCP: Crist Infante, MD  D/C Plans:       Home  Post-op Meds:       No Rx given   Tranexamic Acid:      To be given - IV  Decadron:      Is to be given  FYI:     Eliquis   Norco  No Celebrex  DME:   Rx given for - RW and 3-n-1  PT:   OPPT Rx given    Past Medical History:  Diagnosis Date  . Arthritis    thumbs  . Atrial tachycardia ([place])   . 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 Cataract    bilateral  . Chronic anticoagulation    on Xarelto  . Chronic diastolic  CHF (congestive heart failure) (HCC)    Echocardiogram (11/24/12): Mild LVH, EF 60%.  [name] GERD (gastroesophageal reflux disease)   . Heart murmur    slight  . Hyperlipidemia    a. Hx of leg weakness while on statin. under control  . Hypertension   . Hypothyroidism   . Leg fracture Dec 21,2011  . Osteoporosis 07/2007  . Parkinson's disease ([place]) 2011  . Persistent atrial fibrillation (HCC)    s/p atrial fib ablation 11-11-11.   [name] Shortness of breath    "when walking at incline or stairs"  . Sinus brady-tachy syndrome ([place])   . Squamous carcinoma summer 2015   back of left thigh  . Wrist fracture    right     Past Surgical History:  Procedure Laterality Date  . ATRIAL FIBRILLATION ABLATION N/A 11/11/2011   Procedure: ATRIAL FIBRILLATION ABLATION;  Surgeon: Thompson Grayer, MD;  Location: [facility] CATH LAB;  Service: Cardiovascular;  Laterality: N/A;  . BREAST BIOPSY  1998  . BREAST SURGERY     benign cyst removed  . CARDIAC ELECTROPHYSIOLOGY STUDY AND ABLATION  5/14, 7/14   convergent AF ablation [facility] and subsequent atypical atrial flutter ablation by Dr Lehman Prom  . CARDIOVASCULAR STRESS TEST  06-06-2008   EF 73%  . CARDIOVERSION  09/09/2011   Procedure: CARDIOVERSION;  Surgeon: Darlin Coco, MD;  Location: [facility];  Service: Cardiovascular;  Laterality: N/A;  to be done by dr. Mare Ferrari  . CARDIOVERSION  02/18/2012   Procedure: CARDIOVERSION;  Surgeon: Thayer Headings, MD;  Location: [place];  Service: Cardiovascular;  Laterality: N/A;  . CHOLECYSTECTOMY  1995  . FEMUR FRACTURE SURGERY  2011   metal pin in place  . FOREARM SURGERY Left 2010   "opened arm to drain it"  . INSERTION OF MESH N/A 11/24/2013   Procedure: INSERTION OF MESH;  Surgeon: Michael Boston, MD;  Location: [facility] ORS;  Service: General;  Laterality: N/A;  . KNEE SURGERY Left 2001  . PACEMAKER PLACEMENT  10/20/12   MDT Advisa DR implanted by Dr Lehman Prom [facility]   . SKIN CANCER DESTRUCTION  summer 2015  . TEE  WITHOUT CARDIOVERSION  11/10/2011   Procedure: TRANSESOPHAGEAL ECHOCARDIOGRAM (TEE);  Surgeon: Thayer Headings, MD;  Location: [facility];  Service: Cardiovascular;  Laterality: N/A;  . TONSILLECTOMY  as child  . US ECHOCARDIOGRAPHY  03-10-2008   Est EF 55-60%, Dr. Cathie Olden  . VENTRAL HERNIA REPAIR N/A 11/24/2013   Procedure: LAPAROSCOPIC VENTRAL HERNIA;  Surgeon: Michael Boston, MD;  Location: [facility];  Service: General;  Laterality: N/A;  . WRIST SURGERY Right ~2009   metal rod in place    Allergies  Allergen Reactions  . Statins     Leg weakness - but tolerating low dose Crestor every other day    Social History  Substance Use Topics  . Smoking status: Never Smoker  . Smokeless tobacco: Never Used  . Alcohol use Yes     Comment: 1-2 glasses of wine daily    Family History  Problem Relation Age of Onset  . Alzheimer's disease Mother   . Heart failure Father   . Breast cancer Maternal Aunt      Review of Systems  Constitutional: Negative.   HENT: Negative.   Eyes: Negative.   Respiratory: Positive for shortness of breath (with exertion).   Cardiovascular: Negative.   Gastrointestinal: Negative.   Genitourinary: Negative.   Musculoskeletal: Positive for joint pain.  Skin: Negative.   Neurological: Negative.   Endo/Heme/Allergies: Negative.   Psychiatric/Behavioral: Negative.      Objective:   Physical Exam  Constitutional: She is well-developed, well-nourished, and in no distress.  Eyes: Pupils are equal, round, and reactive to light.  Neck: Neck supple. No JVD present. No tracheal deviation present. No thyromegaly present.  Cardiovascular: Normal rate, regular rhythm and intact distal pulses.   Pulmonary/Chest: Effort normal and breath sounds normal. No respiratory distress. She has no wheezes.  Abdominal: Soft. There is no tenderness. There is no guarding.  Musculoskeletal:       Left knee: She exhibits decreased range of motion, swelling and bony tenderness.  She exhibits no ecchymosis, no deformity, no laceration and no erythema. Tenderness found. Medial joint line tenderness noted. No lateral joint line tenderness noted.  Lymphadenopathy:    She has no cervical adenopathy.  Neurological: She is alert.  Skin: Skin is warm and dry.       Labs:  Estimated body mass index is 26.16 kg/m as calculated from the following:   Height as of 10/09/16: 5\' 2"  (1.575 m).   Weight as of 10/09/16: 64.9 kg (143 lb).   Imaging Review Plain radiographs demonstrate severe degenerative joint disease of the left knee(s) medial compartment.  The bone quality appears to be good for age and reported activity level.  Assessment/Plan:  End stage arthritis, left knee medial compartment  The patient history, physical examination, clinical judgment of the provider and imaging studies are consistent with end stage degenerative joint disease of the left knee(s) and medial unicompartmental knee arthroplasty is deemed medically necessary. The treatment options including medical management, injection therapy arthroscopy and arthroplasty were discussed at length. The risks and benefits of total knee arthroplasty were presented and reviewed. The risks due to aseptic loosening, infection, stiffness, patella tracking problems, thromboembolic complications and other imponderables were discussed. The patient acknowledged the explanation, agreed to proceed with the plan and consent was signed. Patient is being admitted for outpatient / observation treatment for surgery, pain control, PT, OT, prophylactic antibiotics, VTE prophylaxis, progressive ambulation and ADL's and discharge planning. The patient is planning to be discharged home.    [place] Terrica Duecker   PA-C  10/31/2016, 12:29 PM

## 2016-11-05 ENCOUNTER — Other Ambulatory Visit: Payer: Self-pay | Admitting: Neurology

## 2016-11-07 ENCOUNTER — Encounter (HOSPITAL_COMMUNITY): Payer: Self-pay

## 2016-11-07 NOTE — Patient Instructions (Signed)
[name] [name]  11/07/2016   Your procedure is scheduled on: 11/18/16  Report to [facility] Main  Entrance   Report to admitting at   0800 AM   Call this number if you have problems the morning of surgery  [phone]   Remember: ONLY 1 PERSON MAY GO WITH YOU TO SHORT STAY TO GET  READY MORNING OF YOUR SURGERY.  Do not eat food or drink liquids :After Midnight.     Take these medicines the morning of surgery with A SIP OF WATER:metoprolol, levothyroxine, carbidopa-levodopa, rasagiline                                 You may not have any metal on your body including hair pins and              piercings  Do not wear jewelry, make-up, lotions, powders or perfumes, deodorant             Do not wear nail polish.  Do not shave  48 hours prior to surgery.     Do not bring valuables to the hospital. [place].  Contacts, dentures or bridgework may not be worn into surgery.  Leave suitcase in the car. After surgery it may be brought to your room.                Please read over the following fact sheets you were given: _____________________________________________________________________           [facility] - Preparing for Surgery Before surgery, you can play an important role.  Because skin is not sterile, your skin needs to be as free of germs as possible.  You can reduce the number of germs on your skin by washing with CHG (chlorahexidine gluconate) soap before surgery.  CHG is an antiseptic cleaner which kills germs and bonds with the skin to continue killing germs even after washing. Please DO NOT use if you have an allergy to CHG or antibacterial soaps.  If your skin becomes reddened/irritated stop using the CHG and inform your nurse when you arrive at Short Stay. Do not shave (including legs and underarms) for at least 48 hours prior to the first CHG shower.  You may shave your face/neck. Please follow  these instructions carefully:  1.  Shower with CHG Soap the night before surgery and the  morning of Surgery.  2.  If you choose to wash your hair, wash your hair first as usual with your  normal  shampoo.  3.  After you shampoo, rinse your hair and body thoroughly to remove the  shampoo.                           4.  Use CHG as you would any other liquid soap.  You can apply chg directly  to the skin and wash                       Gently with a scrungie or clean washcloth.  5.  Apply the CHG Soap to your body ONLY FROM THE NECK DOWN.   Do not use on face/ open  Wound or open sores. Avoid contact with eyes, ears mouth and genitals (private parts).                       Wash face,  Genitals (private parts) with your normal soap.             6.  Wash thoroughly, paying special attention to the area where your surgery  will be performed.  7.  Thoroughly rinse your body with warm water from the neck down.  8.  DO NOT shower/wash with your normal soap after using and rinsing off  the CHG Soap.                9.  Pat yourself dry with a clean towel.            10.  Wear clean pajamas.            11.  Place clean sheets on your bed the night of your first shower and do not  sleep with pets. Day of Surgery : Do not apply any lotions/deodorants the morning of surgery.  Please wear clean clothes to the hospital/surgery center.  FAILURE TO FOLLOW THESE INSTRUCTIONS MAY RESULT IN THE CANCELLATION OF YOUR SURGERY PATIENT SIGNATURE_________________________________  NURSE SIGNATURE__________________________________  ________________________________________________________________________  WHAT IS A BLOOD TRANSFUSION? Blood Transfusion Information  A transfusion is the replacement of blood or some of its parts. Blood is made up of multiple cells which provide different functions.  Red blood cells carry oxygen and are used for blood loss replacement.  White blood cells fight  against infection.  Platelets control bleeding.  Plasma helps clot blood.  Other blood products are available for specialized needs, such as hemophilia or other clotting disorders. BEFORE THE TRANSFUSION  Who gives blood for transfusions?   Healthy volunteers who are fully evaluated to make sure their blood is safe. This is blood bank blood. Transfusion therapy is the safest it has ever been in the practice of medicine. Before blood is taken from a donor, a complete history is taken to make sure that person has no history of diseases nor engages in risky social behavior (examples are intravenous drug use or sexual activity with multiple partners). The donor's travel history is screened to minimize risk of transmitting infections, such as malaria. The donated blood is tested for signs of infectious diseases, such as HIV and hepatitis. The blood is then tested to be sure it is compatible with you in order to minimize the chance of a transfusion reaction. If you or a relative donates blood, this is often done in anticipation of surgery and is not appropriate for emergency situations. It takes many days to process the donated blood. RISKS AND COMPLICATIONS Although transfusion therapy is very safe and saves many lives, the main dangers of transfusion include:   Getting an infectious disease.  Developing a transfusion reaction. This is an allergic reaction to something in the blood you were given. Every precaution is taken to prevent this. The decision to have a blood transfusion has been considered carefully by your caregiver before blood is given. Blood is not given unless the benefits outweigh the risks. AFTER THE TRANSFUSION  Right after receiving a blood transfusion, you will usually feel much better and more energetic. This is especially true if your red blood cells have gotten low (anemic). The transfusion raises the level of the red blood cells which carry oxygen, and this usually causes an  energy increase.  The nurse administering the transfusion will monitor you carefully for complications. HOME CARE INSTRUCTIONS  No special instructions are needed after a transfusion. You may find your energy is better. Speak with your caregiver about any limitations on activity for underlying diseases you may have. SEEK MEDICAL CARE IF:   Your condition is not improving after your transfusion.  You develop redness or irritation at the intravenous (IV) site. SEEK IMMEDIATE MEDICAL CARE IF:  Any of the following symptoms occur over the next 12 hours:  Shaking chills.  You have a temperature by mouth above 102 F (38.9 C), not controlled by medicine.  Chest, back, or muscle pain.  People around you feel you are not acting correctly or are confused.  Shortness of breath or difficulty breathing.  Dizziness and fainting.  You get a rash or develop hives.  You have a decrease in urine output.  Your urine turns a dark color or changes to pink, red, or brown. Any of the following symptoms occur over the next 10 days:  You have a temperature by mouth above 102 F (38.9 C), not controlled by medicine.  Shortness of breath.  Weakness after normal activity.  The white part of the eye turns yellow (jaundice).  You have a decrease in the amount of urine or are urinating less often.  Your urine turns a dark color or changes to pink, red, or brown. Document Released: 01/18/2000 Document Revised: 04/14/2011 Document Reviewed: 09/06/2007 ExitCare Patient Information 2014 [place].  _______________________________________________________________________  Incentive Spirometer  An incentive spirometer is a tool that can help keep your lungs clear and active. This tool measures how well you are filling your lungs with each breath. Taking long deep breaths may help reverse or decrease the chance of developing breathing (pulmonary) problems (especially infection) following:  A  long period of time when you are unable to move or be active. BEFORE THE PROCEDURE   If the spirometer includes an indicator to show your best effort, your nurse or respiratory therapist will set it to a desired goal.  If possible, sit up straight or lean slightly forward. Try not to slouch.  Hold the incentive spirometer in an upright position. INSTRUCTIONS FOR USE  1. Sit on the edge of your bed if possible, or sit up as far as you can in bed or on a chair. 2. Hold the incentive spirometer in an upright position. 3. Breathe out normally. 4. Place the mouthpiece in your mouth and seal your lips tightly around it. 5. Breathe in slowly and as deeply as possible, raising the piston or the ball toward the top of the column. 6. Hold your breath for 3-5 seconds or for as long as possible. Allow the piston or ball to fall to the bottom of the column. 7. Remove the mouthpiece from your mouth and breathe out normally. 8. Rest for a few seconds and repeat Steps 1 through 7 at least 10 times every 1-2 hours when you are awake. Take your time and take a few normal breaths between deep breaths. 9. The spirometer may include an indicator to show your best effort. Use the indicator as a goal to work toward during each repetition. 10. After each set of 10 deep breaths, practice coughing to be sure your lungs are clear. If you have an incision (the cut made at the time of surgery), support your incision when coughing by placing a pillow or rolled up towels firmly against it. Once you are able to get  out of bed, walk around indoors and cough well. You may stop using the incentive spirometer when instructed by your caregiver.  RISKS AND COMPLICATIONS  Take your time so you do not get dizzy or light-headed.  If you are in pain, you may need to take or ask for pain medication before doing incentive spirometry. It is harder to take a deep breath if you are having pain. AFTER USE  Rest and breathe slowly and  easily.  It can be helpful to keep track of a log of your progress. Your caregiver can provide you with a simple table to help with this. If you are using the spirometer at home, follow these instructions: [place] IF:   You are having difficultly using the spirometer.  You have trouble using the spirometer as often as instructed.  Your pain medication is not giving enough relief while using the spirometer.  You develop fever of 100.5 F (38.1 C) or higher. SEEK IMMEDIATE MEDICAL CARE IF:   You cough up bloody sputum that had not been present before.  You develop fever of 102 F (38.9 C) or greater.  You develop worsening pain at or near the incision site. MAKE SURE YOU:   Understand these instructions.  Will watch your condition.  Will get help right away if you are not doing well or get worse. Document Released: 06/02/2006 Document Revised: 04/14/2011 Document Reviewed: 08/03/2006 [facility] Patient Information 2014 [place], [place].   ________________________________________________________________________

## 2016-11-07 NOTE — Progress Notes (Addendum)
Clearance Dr. Rayann Heman chart Clearance    10/21/16 Dr. Joylene Draft  Chart  ekg 09/03/16 epic  last device check 09/03/16 epic Echo 03/20/15 epic

## 2016-11-10 ENCOUNTER — Encounter (HOSPITAL_COMMUNITY): Payer: Self-pay

## 2016-11-10 ENCOUNTER — Encounter (HOSPITAL_COMMUNITY)
Admission: RE | Admit: 2016-11-10 | Discharge: 2016-11-10 | Disposition: A | Discharge status: Home / Self Care | Payer: Medicare Other | Source: Ambulatory Visit | Attending: Orthopedic Surgery | Admitting: Orthopedic Surgery

## 2016-11-10 DIAGNOSIS — Z01812 Encounter for preprocedural laboratory examination: Secondary | ICD-10-CM | POA: Insufficient documentation

## 2016-11-10 DIAGNOSIS — M1712 Unilateral primary osteoarthritis, left knee: Secondary | ICD-10-CM | POA: Diagnosis not present

## 2016-11-10 DIAGNOSIS — Z0183 Encounter for blood typing: Secondary | ICD-10-CM | POA: Diagnosis not present

## 2016-11-10 HISTORY — DX: Cardiac arrhythmia, unspecified: I49.9

## 2016-11-10 HISTORY — DX: Nausea with vomiting, unspecified: R11.2

## 2016-11-10 HISTORY — DX: Anemia, unspecified: D64.9

## 2016-11-10 HISTORY — DX: Presence of cardiac pacemaker: Z95.0

## 2016-11-10 HISTORY — DX: Nausea with vomiting, unspecified: Z98.890

## 2016-11-10 LAB — BASIC METABOLIC PANEL
ANION GAP: 8 (ref 5–15)
BUN: 19 mg/dL (ref 6–20)
CALCIUM: 9.5 mg/dL (ref 8.9–10.3)
CO2: 32 mmol/L (ref 22–32)
Chloride: 99 mmol/L — ABNORMAL LOW (ref 101–111)
Creatinine, Ser: 0.79 mg/dL (ref 0.44–1.00)
GFR calc Af Amer: >60 mL/min (ref >60)
GLUCOSE: 97 mg/dL (ref 65–99)
Potassium: 3.8 mmol/L (ref 3.5–5.1)
Sodium: 139 mmol/L (ref 135–145)

## 2016-11-10 LAB — ABO/RH: ABO/RH(D): O NEG

## 2016-11-10 LAB — CBC
HEMATOCRIT: 32.2 % — AB (ref 36.0–46.0)
Hemoglobin: 10.9 g/dL — ABNORMAL LOW (ref 12.0–15.0)
MCH: 29.5 pg (ref 26.0–34.0)
MCHC: 33.9 g/dL (ref 30.0–36.0)
MCV: 87.3 fL (ref 78.0–100.0)
PLATELETS: 187 10*3/uL (ref 150–400)
RBC: 3.69 MIL/uL — ABNORMAL LOW (ref 3.87–5.11)
RDW: 18 % — AB (ref 11.5–15.5)
WBC: 5.9 10*3/uL (ref 4.0–10.5)

## 2016-11-10 LAB — SURGICAL PCR SCREEN
MRSA, PCR: NEGATIVE
Staphylococcus aureus: NEGATIVE

## 2016-11-15 ENCOUNTER — Other Ambulatory Visit: Payer: Self-pay | Admitting: Neurology

## 2016-11-18 ENCOUNTER — Encounter (HOSPITAL_COMMUNITY)
Admission: RE | Disposition: A | Discharge status: Home / Self Care | Payer: Self-pay | Source: Ambulatory Visit | Attending: Orthopedic Surgery

## 2016-11-18 ENCOUNTER — Ambulatory Visit (HOSPITAL_COMMUNITY)
Admission: RE | Admit: 2016-11-18 | Discharge: 2016-11-18 | Disposition: A | Discharge status: Home / Self Care | Payer: Medicare Other | Source: Ambulatory Visit | Attending: Orthopedic Surgery | Admitting: Orthopedic Surgery

## 2016-11-18 ENCOUNTER — Encounter (HOSPITAL_COMMUNITY): Payer: Self-pay | Admitting: Certified Registered Nurse Anesthetist

## 2016-11-18 ENCOUNTER — Ambulatory Visit (HOSPITAL_COMMUNITY): Payer: Medicare Other | Admitting: Anesthesiology

## 2016-11-18 DIAGNOSIS — M810 Age-related osteoporosis without current pathological fracture: Secondary | ICD-10-CM | POA: Insufficient documentation

## 2016-11-18 DIAGNOSIS — M1712 Unilateral primary osteoarthritis, left knee: Secondary | ICD-10-CM | POA: Diagnosis not present

## 2016-11-18 DIAGNOSIS — Z888 Allergy status to other drugs, medicaments and biological substances status: Secondary | ICD-10-CM | POA: Diagnosis not present

## 2016-11-18 DIAGNOSIS — R011 Cardiac murmur, unspecified: Secondary | ICD-10-CM | POA: Insufficient documentation

## 2016-11-18 DIAGNOSIS — G8918 Other acute postprocedural pain: Secondary | ICD-10-CM | POA: Diagnosis not present

## 2016-11-18 DIAGNOSIS — Z85828 Personal history of other malignant neoplasm of skin: Secondary | ICD-10-CM | POA: Diagnosis not present

## 2016-11-18 DIAGNOSIS — E039 Hypothyroidism, unspecified: Secondary | ICD-10-CM | POA: Insufficient documentation

## 2016-11-18 DIAGNOSIS — M19041 Primary osteoarthritis, right hand: Secondary | ICD-10-CM | POA: Diagnosis not present

## 2016-11-18 DIAGNOSIS — I484 Atypical atrial flutter: Secondary | ICD-10-CM | POA: Diagnosis not present

## 2016-11-18 DIAGNOSIS — I081 Rheumatic disorders of both mitral and tricuspid valves: Secondary | ICD-10-CM | POA: Diagnosis not present

## 2016-11-18 DIAGNOSIS — E785 Hyperlipidemia, unspecified: Secondary | ICD-10-CM | POA: Diagnosis not present

## 2016-11-18 DIAGNOSIS — I481 Persistent atrial fibrillation: Secondary | ICD-10-CM | POA: Diagnosis not present

## 2016-11-18 DIAGNOSIS — I110 Hypertensive heart disease with heart failure: Secondary | ICD-10-CM | POA: Diagnosis not present

## 2016-11-18 DIAGNOSIS — I5032 Chronic diastolic (congestive) heart failure: Secondary | ICD-10-CM | POA: Insufficient documentation

## 2016-11-18 DIAGNOSIS — K219 Gastro-esophageal reflux disease without esophagitis: Secondary | ICD-10-CM | POA: Diagnosis not present

## 2016-11-18 DIAGNOSIS — G20 Parkinson's disease: Secondary | ICD-10-CM | POA: Insufficient documentation

## 2016-11-18 DIAGNOSIS — M25762 Osteophyte, left knee: Secondary | ICD-10-CM | POA: Diagnosis not present

## 2016-11-18 DIAGNOSIS — Z96652 Presence of left artificial knee joint: Secondary | ICD-10-CM

## 2016-11-18 DIAGNOSIS — Z950 Presence of cardiac pacemaker: Secondary | ICD-10-CM | POA: Diagnosis not present

## 2016-11-18 DIAGNOSIS — M19042 Primary osteoarthritis, left hand: Secondary | ICD-10-CM | POA: Insufficient documentation

## 2016-11-18 DIAGNOSIS — Z7901 Long term (current) use of anticoagulants: Secondary | ICD-10-CM | POA: Diagnosis not present

## 2016-11-18 DIAGNOSIS — I5033 Acute on chronic diastolic (congestive) heart failure: Secondary | ICD-10-CM | POA: Diagnosis not present

## 2016-11-18 HISTORY — PX: PARTIAL KNEE ARTHROPLASTY: SHX2174

## 2016-11-18 LAB — TYPE AND SCREEN
ABO/RH(D): O NEG
ANTIBODY SCREEN: NEGATIVE

## 2016-11-18 SURGERY — ARTHROPLASTY, KNEE, UNICOMPARTMENTAL
Anesthesia: Spinal | Site: Knee | Laterality: Left

## 2016-11-18 MED ORDER — MIDAZOLAM HCL 2 MG/2ML IJ SOLN
INTRAMUSCULAR | Status: AC
  Administered 2016-11-18: 2 mg via INTRAVENOUS
  Filled 2016-11-18: qty 2

## 2016-11-18 MED ORDER — METHOCARBAMOL 1000 MG/10ML IJ SOLN
500.0000 mg | Freq: Four times a day (QID) | INTRAVENOUS | Status: DC | PRN
  Filled 2016-11-18: qty 5

## 2016-11-18 MED ORDER — CEFAZOLIN SODIUM-DEXTROSE 2-4 GM/100ML-% IV SOLN
INTRAVENOUS | Status: AC
  Filled 2016-11-18: qty 100

## 2016-11-18 MED ORDER — MIDAZOLAM HCL 2 MG/2ML IJ SOLN
1.0000 mg | INTRAMUSCULAR | Status: DC | PRN
  Administered 2016-11-18: 2 mg via INTRAVENOUS

## 2016-11-18 MED ORDER — LACTATED RINGERS IV SOLN
INTRAVENOUS | Status: DC
  Administered 2016-11-18 (×2): via INTRAVENOUS
  Administered 2016-11-18: 1000 mL via INTRAVENOUS

## 2016-11-18 MED ORDER — FENTANYL CITRATE (PF) 100 MCG/2ML IJ SOLN
50.0000 ug | INTRAMUSCULAR | Status: DC | PRN
  Administered 2016-11-18 (×2): 50 ug via INTRAVENOUS

## 2016-11-18 MED ORDER — ROPIVACAINE HCL 7.5 MG/ML IJ SOLN
INTRAMUSCULAR | Status: DC | PRN
  Administered 2016-11-18: 5 mL via PERINEURAL

## 2016-11-18 MED ORDER — CEFAZOLIN SODIUM-DEXTROSE 2-4 GM/100ML-% IV SOLN
2.0000 g | INTRAVENOUS | Status: AC
  Administered 2016-11-18: 2 g via INTRAVENOUS

## 2016-11-18 MED ORDER — KETOROLAC TROMETHAMINE 30 MG/ML IJ SOLN
INTRAMUSCULAR | Status: AC
  Filled 2016-11-18: qty 1

## 2016-11-18 MED ORDER — SODIUM CHLORIDE 0.9 % IV BOLUS (SEPSIS): 500.0000 mL | Freq: Once | INTRAVENOUS | Status: DC

## 2016-11-18 MED ORDER — DIPHENHYDRAMINE HCL 25 MG PO CAPS: 25.0000 mg | ORAL_CAPSULE | Freq: Four times a day (QID) | ORAL | Status: DC | PRN

## 2016-11-18 MED ORDER — POLYETHYLENE GLYCOL 3350 17 G PO PACK: 17.0000 g | PACK | Freq: Two times a day (BID) | ORAL | 0 refills | Status: DC

## 2016-11-18 MED ORDER — METHOCARBAMOL 500 MG PO TABS: 500.0000 mg | ORAL_TABLET | Freq: Four times a day (QID) | ORAL | 0 refills | Status: DC | PRN

## 2016-11-18 MED ORDER — FERROUS SULFATE 325 (65 FE) MG PO TABS: 325.0000 mg | ORAL_TABLET | Freq: Three times a day (TID) | ORAL | 3 refills | Status: DC

## 2016-11-18 MED ORDER — FENTANYL CITRATE (PF) 100 MCG/2ML IJ SOLN
INTRAMUSCULAR | Status: AC
  Administered 2016-11-18: 50 ug via INTRAVENOUS
  Filled 2016-11-18: qty 2

## 2016-11-18 MED ORDER — BUPIVACAINE-EPINEPHRINE 0.25% -1:200000 IJ SOLN
INTRAMUSCULAR | Status: DC | PRN
  Administered 2016-11-18: 30 mL

## 2016-11-18 MED ORDER — SODIUM CHLORIDE 0.9 % IJ SOLN
INTRAMUSCULAR | Status: AC
  Filled 2016-11-18: qty 30

## 2016-11-18 MED ORDER — SODIUM CHLORIDE 0.9 % IJ SOLN
INTRAMUSCULAR | Status: DC | PRN
  Administered 2016-11-18: 30 mL

## 2016-11-18 MED ORDER — ONDANSETRON HCL 4 MG/2ML IJ SOLN
INTRAMUSCULAR | Status: AC
  Filled 2016-11-18: qty 2

## 2016-11-18 MED ORDER — DEXAMETHASONE SODIUM PHOSPHATE 10 MG/ML IJ SOLN
10.0000 mg | Freq: Once | INTRAMUSCULAR | Status: AC
  Administered 2016-11-18: 10 mg via INTRAVENOUS

## 2016-11-18 MED ORDER — PROPOFOL 10 MG/ML IV BOLUS
INTRAVENOUS | Status: AC
  Filled 2016-11-18: qty 60

## 2016-11-18 MED ORDER — BUPIVACAINE-EPINEPHRINE (PF) 0.25% -1:200000 IJ SOLN
INTRAMUSCULAR | Status: AC
  Filled 2016-11-18: qty 30

## 2016-11-18 MED ORDER — CARBIDOPA-LEVODOPA 25-100 MG PO TABS
1.0000 | ORAL_TABLET | Freq: Three times a day (TID) | ORAL | Status: DC
  Administered 2016-11-18: 1 via ORAL
  Filled 2016-11-18: qty 1

## 2016-11-18 MED ORDER — TRANEXAMIC ACID 1000 MG/10ML IV SOLN
1000.0000 mg | INTRAVENOUS | Status: AC
  Administered 2016-11-18: 1000 mg via INTRAVENOUS
  Filled 2016-11-18: qty 1100

## 2016-11-18 MED ORDER — HYDROCODONE-ACETAMINOPHEN 7.5-325 MG PO TABS: 1.0000 | ORAL_TABLET | ORAL | Status: DC | PRN

## 2016-11-18 MED ORDER — PROPOFOL 500 MG/50ML IV EMUL
INTRAVENOUS | Status: DC | PRN
  Administered 2016-11-18: 100 ug/kg/min via INTRAVENOUS

## 2016-11-18 MED ORDER — KETOROLAC TROMETHAMINE 30 MG/ML IJ SOLN
INTRAMUSCULAR | Status: DC | PRN
  Administered 2016-11-18: 30 mg

## 2016-11-18 MED ORDER — PROPOFOL 10 MG/ML IV BOLUS
INTRAVENOUS | Status: AC
  Filled 2016-11-18: qty 20

## 2016-11-18 MED ORDER — ONDANSETRON HCL 4 MG/2ML IJ SOLN
INTRAMUSCULAR | Status: DC | PRN
  Administered 2016-11-18: 4 mg via INTRAVENOUS

## 2016-11-18 MED ORDER — METOPROLOL TARTRATE 5 MG/5ML IV SOLN
2.0000 mg | Freq: Once | INTRAVENOUS | Status: AC
  Administered 2016-11-18: 10:00:00 via INTRAVENOUS

## 2016-11-18 MED ORDER — METOPROLOL TARTRATE 5 MG/5ML IV SOLN
2.0000 mg | Freq: Once | INTRAVENOUS | Status: AC
  Administered 2016-11-18: 2 mg via INTRAVENOUS

## 2016-11-18 MED ORDER — DEXAMETHASONE SODIUM PHOSPHATE 10 MG/ML IJ SOLN
INTRAMUSCULAR | Status: AC
  Filled 2016-11-18: qty 1

## 2016-11-18 MED ORDER — HYDROCODONE-ACETAMINOPHEN 7.5-325 MG PO TABS: 1.0000 | ORAL_TABLET | ORAL | 0 refills | Status: DC | PRN

## 2016-11-18 MED ORDER — FENTANYL CITRATE (PF) 100 MCG/2ML IJ SOLN: 25.0000 ug | INTRAMUSCULAR | Status: DC | PRN

## 2016-11-18 MED ORDER — METHOCARBAMOL 500 MG PO TABS: 500.0000 mg | ORAL_TABLET | Freq: Four times a day (QID) | ORAL | Status: DC | PRN

## 2016-11-18 MED ORDER — METOPROLOL TARTRATE 5 MG/5ML IV SOLN
INTRAVENOUS | Status: AC
  Filled 2016-11-18: qty 5

## 2016-11-18 MED ORDER — BUPIVACAINE IN DEXTROSE 0.75-8.25 % IT SOLN
INTRATHECAL | Status: DC | PRN
  Administered 2016-11-18: 1.6 mL via INTRATHECAL

## 2016-11-18 MED ORDER — METOPROLOL TARTRATE 5 MG/5ML IV SOLN
1.0000 mg | Freq: Once | INTRAVENOUS | Status: AC
  Administered 2016-11-18: 1 mg via INTRAVENOUS

## 2016-11-18 MED ORDER — CHLORHEXIDINE GLUCONATE 4 % EX LIQD: 60.0000 mL | Freq: Once | CUTANEOUS | Status: DC

## 2016-11-18 MED ORDER — TRANEXAMIC ACID 1000 MG/10ML IV SOLN
1000.0000 mg | Freq: Once | INTRAVENOUS | Status: AC
  Administered 2016-11-18: 1000 mg via INTRAVENOUS
  Filled 2016-11-18: qty 1100

## 2016-11-18 MED ORDER — PHENYLEPHRINE 40 MCG/ML (10ML) SYRINGE FOR IV PUSH (FOR BLOOD PRESSURE SUPPORT)
PREFILLED_SYRINGE | INTRAVENOUS | Status: DC | PRN
  Administered 2016-11-18 (×2): 40 ug via INTRAVENOUS
  Administered 2016-11-18: 80 ug via INTRAVENOUS

## 2016-11-18 MED ORDER — ESMOLOL HCL 100 MG/10ML IV SOLN
INTRAVENOUS | Status: DC | PRN
  Administered 2016-11-18: 40 mg via INTRAVENOUS
  Administered 2016-11-18: 60 mg via INTRAVENOUS

## 2016-11-18 MED ORDER — PHENYLEPHRINE 40 MCG/ML (10ML) SYRINGE FOR IV PUSH (FOR BLOOD PRESSURE SUPPORT)
PREFILLED_SYRINGE | INTRAVENOUS | Status: AC
  Filled 2016-11-18: qty 10

## 2016-11-18 MED ORDER — MEPERIDINE HCL 50 MG/ML IJ SOLN: 6.2500 mg | INTRAMUSCULAR | Status: DC | PRN

## 2016-11-18 MED FILL — HYDROCODON-APAP 7.5-325: 7.5-325 | 5 days supply | Qty: 60 | Fill #0

## 2016-11-18 MED FILL — METHOCARBAMOL 500 MG TABLET: 500 | 10 days supply | Qty: 40 | Fill #0

## 2016-11-18 SURGICAL SUPPLY — 41 items
BAG DECANTER FOR FLEXI CONT (MISCELLANEOUS) IMPLANT
BAG ZIPLOCK 12X15 (MISCELLANEOUS) IMPLANT
BANDAGE ACE 6X5 VEL STRL LF (GAUZE/BANDAGES/DRESSINGS) ×3 IMPLANT
BLADE SAW RECIPROCATING 77.5 (BLADE) ×3 IMPLANT
BLADE SAW SGTL 13.0X1.19X90.0M (BLADE) ×3 IMPLANT
BOWL SMART MIX CTS (DISPOSABLE) ×3 IMPLANT
CAPT KNEE PARTIAL 2 ×3 IMPLANT
CEMENT HV SMART SET (Cement) ×3 IMPLANT
CLOTH BEACON ORANGE TIMEOUT ST (SAFETY) ×3 IMPLANT
COVER SURGICAL LIGHT HANDLE (MISCELLANEOUS) ×3 IMPLANT
CUFF TOURN SGL QUICK 34 (TOURNIQUET CUFF) ×2
CUFF TRNQT CYL 34X4X40X1 (TOURNIQUET CUFF) ×1 IMPLANT
DERMABOND ADVANCED (GAUZE/BANDAGES/DRESSINGS) ×2
DERMABOND ADVANCED .7 DNX12 (GAUZE/BANDAGES/DRESSINGS) ×1 IMPLANT
DRAPE U-SHAPE 47X51 STRL (DRAPES) ×3 IMPLANT
DRESSING AQUACEL AG SP 3.5X10 (GAUZE/BANDAGES/DRESSINGS) ×1 IMPLANT
DRSG AQUACEL AG SP 3.5X10 (GAUZE/BANDAGES/DRESSINGS) ×3
DURAPREP 26ML APPLICATOR (WOUND CARE) ×6 IMPLANT
ELECT REM PT RETURN 15FT ADLT (MISCELLANEOUS) ×3 IMPLANT
GLOVE BIOGEL M 7.0 STRL (GLOVE) IMPLANT
GLOVE BIOGEL PI IND STRL 7.5 (GLOVE) ×1 IMPLANT
GLOVE BIOGEL PI IND STRL 8.5 (GLOVE) ×1 IMPLANT
GLOVE BIOGEL PI INDICATOR 7.5 (GLOVE) ×2
GLOVE BIOGEL PI INDICATOR 8.5 (GLOVE) ×2
GLOVE ECLIPSE 8.0 STRL XLNG CF (GLOVE) ×3 IMPLANT
GLOVE ORTHO TXT STRL SZ7.5 (GLOVE) ×6 IMPLANT
GOWN STRL REUS W/TWL LRG LVL3 (GOWN DISPOSABLE) ×3 IMPLANT
GOWN STRL REUS W/TWL XL LVL3 (GOWN DISPOSABLE) ×3 IMPLANT
LEGGING LITHOTOMY PAIR STRL (DRAPES) ×3 IMPLANT
MANIFOLD NEPTUNE II (INSTRUMENTS) ×3 IMPLANT
PACK TOTAL KNEE CUSTOM (KITS) ×3 IMPLANT
SUT MNCRL AB 4-0 PS2 18 (SUTURE) ×3 IMPLANT
SUT STRATAFIX 0 PDS 27 VIOLET (SUTURE) ×3
SUT VIC AB 1 CT1 36 (SUTURE) ×3 IMPLANT
SUT VIC AB 2-0 CT1 27 (SUTURE) ×4
SUT VIC AB 2-0 CT1 TAPERPNT 27 (SUTURE) ×2 IMPLANT
SUTURE STRATFX 0 PDS 27 VIOLET (SUTURE) ×1 IMPLANT
SYR 50ML LL SCALE MARK (SYRINGE) IMPLANT
TRAY FOLEY CATH 14FRSI W/METER (CATHETERS) ×3 IMPLANT
TRAY FOLEY W/METER SILVER 16FR (SET/KITS/TRAYS/PACK) IMPLANT
WRAP KNEE MAXI GEL POST OP (GAUZE/BANDAGES/DRESSINGS) ×3 IMPLANT

## 2016-11-18 NOTE — Anesthesia Postprocedure Evaluation (Signed)
Anesthesia Post Note  Patient: Shelsey Rieth  Procedure(s) Performed: LEFT UNICOMPARTMENTAL KNEE Medially (Left Knee)     Patient location during evaluation: PACU Anesthesia Type: Spinal Level of consciousness: oriented and awake and alert Pain management: pain level controlled Vital Signs Assessment: post-procedure vital signs reviewed and stable Respiratory status: spontaneous breathing, respiratory function stable and patient connected to nasal cannula oxygen Cardiovascular status: blood pressure returned to baseline and stable Postop Assessment: no headache, no backache and no apparent nausea or vomiting Anesthetic complications: no    Last Vitals:  Vitals:   11/18/16 1219 11/18/16 1230  BP: 124/75 132/84  Pulse: 78 72  Resp: 18 15  Temp: (!) 36.4 C   SpO2: 100% 100%    Last Pain:  Vitals:   11/18/16 1230  TempSrc:   PainSc: 0-No pain                 Ondria Oswald

## 2016-11-18 NOTE — Progress Notes (Signed)
AssistedDr. Ambrose Pancoast with left, ultrasound guided, adductor canal block. Side rails up, monitors on throughout procedure. See vital signs in flow sheet. Tolerated Procedure well.  Shortly after block completed, heart rate increased over period of couple of minutes toatrial fib 150,s to 180.s.Dr. Eligha Bridegroom in room.  Given Metoprolol as ordered with gradual slowing of atrial fib rate to 150's.  Pt. States she can feel heart racing but otherwise asymptomatic.  B/P stable.  CRNA in room.  Esmolol then given a ordered.  Rate decreased over next few minutes to 110's-120's.  Pt. Continues to tolerate situation well and voices desire to proceed with surgery.  Remains stable,  Dr. Alvan Dame aware of episode and is OK to procced.  Patient transported to OR.

## 2016-11-18 NOTE — Evaluation (Signed)
Physical Therapy Evaluation Patient Details Name: [name] [name] MRN: [id number] DOB: 1940/06/07 Today's Date: 11/18/2016   History of Present Illness  76 y.o. female admitted for L UKA. PMH of Parkinsons  Clinical Impression  Pt ambulated 120' with RW with supervision, no loss of balance. Instructed pt/spouse in HEP, issued handout of exercises, they demonstrate good understanding. She has no stairs in her home, stair training deferred. She is ready to DC home from PT standpoint. She plans to start outpatient PT 11/21/16.     Follow Up Recommendations Outpatient PT    Equipment Recommendations  None recommended by PT    Recommendations for Other Services       Precautions / Restrictions Precautions Precautions: Knee Precaution Booklet Issued: Yes (comment) Precaution Comments: reviewed no pillow under knee Restrictions Weight Bearing Restrictions: No Other Position/Activity Restrictions: WBAT      Mobility  Bed Mobility Overal bed mobility: Independent                Transfers Overall transfer level: Needs assistance Equipment used: Rolling walker (2 wheeled) Transfers: Sit to/from Stand Sit to Stand: Supervision         General transfer comment: VCs hand placement  Ambulation/Gait Ambulation/Gait assistance: Supervision Ambulation Distance (Feet): 120 Feet Assistive device: Rolling walker (2 wheeled) Gait Pattern/deviations: Step-through pattern;Decreased stride length;Step-to pattern   Gait velocity interpretation: at or above normal speed for age/gender General Gait Details: steady with RW, no LOB, VCs sequencing, initially step-to, then step through pattern  Stairs            Wheelchair Mobility    Modified Rankin (Stroke Patients Only)       Balance Overall balance assessment: Modified Independent (h/o 1 fall in past 1 year (tripped over curb she didn't see))                                           Pertinent  Vitals/Pain Pain Assessment: 0-10 Pain Score: 3  Pain Location: L knee Pain Descriptors / Indicators: Sore Pain Intervention(s): Limited activity within patient's tolerance;Monitored during session;Premedicated before session;Ice applied    Home Living Family/patient expects to be discharged to:: Private residence Living Arrangements: Spouse/significant other Available Help at Discharge: Family;Available 24 hours/day   Home Access: Level entry     Home Layout: One level Home Equipment: Walker - 2 wheels      Prior Function Level of Independence: Independent               Hand Dominance        Extremity/Trunk Assessment   Upper Extremity Assessment Upper Extremity Assessment: Overall WFL for tasks assessed    Lower Extremity Assessment Lower Extremity Assessment: LLE deficits/detail LLE Deficits / Details: 0-95* R knee AAROM, SLR 3/5, knee ext 4/5    Cervical / Trunk Assessment Cervical / Trunk Assessment: Normal  Communication   Communication: No difficulties  Cognition Arousal/Alertness: Awake/alert Behavior During Therapy: WFL for tasks assessed/performed Overall Cognitive Status: Within Functional Limits for tasks assessed                                        General Comments      Exercises Total Joint Exercises Ankle Circles/Pumps: AROM;Both;10 reps;Supine Quad Sets: AROM;Left;5 reps;Supine Short Arc Quad: Left;10 reps;AROM;Supine Heel Slides: AAROM;Left;10  reps;Supine Hip ABduction/ADduction: AROM;Left;10 reps;Supine Straight Leg Raises: AROM;Left;10 reps;Supine Long Arc Quad: AROM;Left;20 reps;Seated Knee Flexion: AAROM;Left;Seated;5 reps Goniometric ROM: 0-95* AAROM L knee   Assessment/Plan    PT Assessment All further PT needs can be met in the next venue of care  PT Problem List Decreased strength;Decreased range of motion;Decreased activity tolerance;Decreased mobility;Pain;Decreased knowledge of use of DME       PT  Treatment Interventions      PT Goals (Current goals can be found in the Care Plan section)  Acute Rehab PT Goals Patient Stated Goal: to walk PT Goal Formulation: All assessment and education complete, DC therapy    Frequency     Barriers to discharge        Co-evaluation               AM-PAC PT "6 Clicks" Daily Activity  Outcome Measure Difficulty turning over in bed (including adjusting bedclothes, sheets and blankets)?: None Difficulty moving from lying on back to sitting on the side of the bed? : None Difficulty sitting down on and standing up from a chair with arms (e.g., wheelchair, bedside commode, etc,.)?: A Little Help needed moving to and from a bed to chair (including a wheelchair)?: A Little Help needed walking in hospital room?: A Little Help needed climbing 3-5 steps with a railing? : A Little 6 Click Score: 20    End of Session Equipment Utilized During Treatment: Gait belt Activity Tolerance: Patient tolerated treatment well Patient left: in chair;with call bell/phone within reach;with family/visitor present;with nursing/sitter in room Nurse Communication: Mobility status      Time: 2119-4174 PT Time Calculation (min) (ACUTE ONLY): 38 min   Charges:   PT Evaluation $PT Eval Low Complexity: 1 Low PT Treatments $Gait Training: 8-22 mins $Therapeutic Exercise: 8-22 mins   PT G Codes:   PT G-Codes **NOT FOR INPATIENT CLASS** Functional Assessment Tool Used: AM-PAC 6 Clicks Basic Mobility Functional Limitation: Mobility: Walking and moving around Mobility: Walking and Moving Around Current Status ([id number]): At least 20 percent but less than 40 percent impaired, limited or restricted Mobility: Walking and Moving Around Goal Status [phone]): At least 20 percent but less than 40 percent impaired, limited or restricted      Philomena Doheny 11/18/2016, 3:11 PM [phone]

## 2016-11-18 NOTE — Transfer of Care (Signed)
Immediate Anesthesia Transfer of Care Note  Patient: Terrin Imparato  Procedure(s) Performed: LEFT UNICOMPARTMENTAL KNEE Medially (Left Knee)  Patient Location: PACU  Anesthesia Type:Spinal  Level of Consciousness: awake, alert  and oriented  Airway & Oxygen Therapy: Patient Spontanous Breathing and Patient connected to face mask oxygen  Post-op Assessment: Report given to RN and Post -op Vital signs reviewed and stable  Post vital signs: Reviewed and stable  Last Vitals:  Vitals:   11/18/16 1009 11/18/16 1010  BP:  (!) 131/94  Pulse: 94 (!) 123  Resp: 18 (!) 22  Temp:    SpO2: 100% 100%    Last Pain:  Vitals:   11/18/16 0850  TempSrc: Oral      Patients Stated Pain Goal: 4 (18/40/37 5436)  Complications: No apparent anesthesia complications

## 2016-11-18 NOTE — Anesthesia Preprocedure Evaluation (Addendum)
Anesthesia Evaluation  Patient identified by MRN, date of birth, ID band Patient awake    Reviewed: Allergy & Precautions, H&P , NPO status , Patient's Chart, lab work & pertinent test results  History of Anesthesia Complications (+) PONV and history of anesthetic complications  Airway Mallampati: II  TM Distance: >3 FB Neck ROM: Full    Dental no notable dental hx.    Pulmonary neg pulmonary ROS,    Pulmonary exam normal breath sounds clear to auscultation       Cardiovascular hypertension, Pt. on medications and Pt. on home beta blockers Normal cardiovascular exam+ dysrhythmias Atrial Fibrillation + pacemaker  Rhythm:Regular Rate:Normal     Neuro/Psych Parkinsons negative psych ROS   GI/Hepatic Neg liver ROS, GERD  Medicated and Controlled,  Endo/Other  Hypothyroidism   Renal/GU negative Renal ROS  negative genitourinary   Musculoskeletal negative musculoskeletal ROS (+) Arthritis , Osteoarthritis,    Abdominal   Peds negative pediatric ROS (+)  Hematology negative hematology ROS (+) Refused blood products:  ,   Anesthesia Other Findings Echo 2/17  - Left ventricle: The cavity size was normal. Wall thickness was   normal. Systolic function was normal. The estimated ejection   fraction was in the range of 55% to 60%. Wall motion was normal;   there were no regional wall motion abnormalities. - Aortic valve: There was trivial regurgitation. - Mitral valve: There was mild regurgitation. - Left atrium: The atrium was moderately dilated. - Right atrium: The atrium was moderately dilated. - Tricuspid valve: There was moderate regurgitation. - Pulmonary arteries: Systolic pressure was moderately increased.   PA peak pressure: 49 mm Hg (S).   Reproductive/Obstetrics negative OB ROS                           Anesthesia Physical  Anesthesia Plan  ASA: III  Anesthesia Plan: Spinal    Post-op Pain Management:  Regional for Post-op pain   Induction: Intravenous  PONV Risk Score and Plan: 3 and Ondansetron, Dexamethasone, Midazolam, Propofol infusion and Treatment may vary due to age or medical condition  Airway Management Planned: Oral ETT  Additional Equipment:   Intra-op Plan:   Post-operative Plan: Extubation in OR  Informed Consent: I have reviewed the patients History and Physical, chart, labs and discussed the procedure including the risks, benefits and alternatives for the proposed anesthesia with the patient or authorized representative who has indicated his/her understanding and acceptance.   Dental advisory given  Plan Discussed with: CRNA  Anesthesia Plan Comments: (  )       Anesthesia Quick Evaluation

## 2016-11-18 NOTE — Progress Notes (Signed)
Patient has walker and elevated toilet seat at home. Single level floor plan with walk in shower. Family does not want bedside commode as per MD order. Pain is under control. She is able to ambulate with walker and gait belt for safety. Husband verbalizes understanding of discharge instructions.

## 2016-11-18 NOTE — Progress Notes (Signed)
Dr. Ambrose Pancoast at pt bedside at this time. Pt is alert, calm, pleasant, and talkative and has no s/s of distress. VS and orders assessed and will closely monitor and tx pt according to MD orders.

## 2016-11-18 NOTE — Op Note (Signed)
NAME: [name] [name]    MEDICAL RECORD NO.: [id number]   FACILITY: [facility] OF BIRTH: 22-Oct-1940  PHYSICIAN: Pietro Cassis. Alvan Dame, M.D.    DATE OF PROCEDURE: 11/18/2016    OPERATIVE REPORT   PREOPERATIVE DIAGNOSIS: Left knee medial compartment osteoarthritis.   POSTOPERATIVE DIAGNOSIS: Left knee medial compartment osteoarthritis.  PROCEDURE: Left partial knee replacement utilizing [employer] Oxford knee  component, size X-small  femur, a left medial size B tibial tray with a size 4 mm insert.   SURGEON: Pietro Cassis. Alvan Dame, M.D.   ASSISTANT: Danae Orleans, PAC.  Please note that Mr. [name] [name] was present for the entirety of the case,  utilized for preoperative positioning, perioperative retractor  management, general facilitation of the case and primary wound closure.   ANESTHESIA: Regional and spinal.   SPECIMENS: None.   COMPLICATIONS: None.  DRAINS: None   TOURNIQUET TIME: 31 minutes at 250 mmHg.   INDICATIONS FOR PROCEDURE: The patient is a 76 y.o. patient of mine who presented for evaluation of left knee pain.  They presented with primary complaints of pain on the medial side of their knee. Radiographs revealed advanced medial compartment arthritis with specifically an antero-medial wear pattern.  There was bone on bone changes noted with subchondral sclerosis and osteophytes present. The patient has had progressive problems failing to respond to conservative measures of medications, injections and activity modification. Risks of infection, DVT, component failure, need for future revision surgery were all discussed and reviewed.  Consent was obtained for benefit of pain relief.   PROCEDURE IN DETAIL: The patient was brought to the operative theater.  Once adequate anesthesia, preoperative antibiotics, 2 gm Ancef, 1 gm of Tranexamic Acid, and 10 mg of Decadron administered, the patient was positioned in supine position with a left thigh tourniquet  placed. The left lower extremity  was prepped and draped in sterile  fashion with the leg on the Oxford leg holder.  The leg was allowed to flex to 120 degrees. A time-out  was performed identifying the patient, planned procedure, and extremity.  The leg was exsanguinated, tourniquet elevated to 250 mmHg. A midline  incision was made from the proximal pole of the patella to the tibial tubercle. A  soft tissue plane was created and partial median arthrotomy was then  made to allow for subluxation of the patella. Following initial synovectomy and  debridement, the osteophytes were removed off the medial aspect of the  knee.   Attention was first directed to the tibia. The tibial  extramedullary guide was positioned over the anterior crest of the tibia  and pinned into position, and using a measured resection guide from the  [place] system, a 4 mm resection was made off the proximal tibia. First  the reciprocating saw along the medial aspect of the tibial spines, then the oscillating saw.    At this point, I sized this cut surface seem to be best fit for a size B tibial tray.  With the retractors out of the wound and the knee held at 90 degrees the 4 feeler gauge had appropriate tension on the medial ligament.   At this point, the femoral canal was opened with a drill and the  intramedullary rod passed. Then using the guide for a X-small posterior resection off  the posterior aspect of the femur was positioned over the mid portion of the medial femoral condyle.  The orientation was set using the guide that mates the femoral guide to the intramedullary rod.  The 2  drill holes were made into the distal femur.  The posterior guide was then impacted into place and the posterior  femoral cut made.  At this point, I milled the distal femur with a size 4 spigot in place. At this point, we did a trial reduction of the X-small femur, size left medial B tibial tray and a size 4 feeler gauge. At 90 degrees of  flexion and at 20 degrees of  flexion the knee had symmetric tension on  the ligaments.   Given these findings, the trial femoral component was removed. Final preparation of tibia was carried out by pinning it in position. Then  using a reciprocating saw I removed bone for the keel. Further bone was  removed with an osteotome.  Trial reduction was now carried out with the X-small femur, the keeled left medial size B tibia, and the 4 lollipop insert. The balance of the  ligaments appeared to be symmetric at 20 degrees and 90 degrees. Given  all these findings, the trial components were removed.   Cement was mixed. The final components were opened. The knee was irrigated with  normal saline solution. Then final debridements of the  soft tissue was carried out, I also drilled the sclerotic bone with a drill.  The final components were cemented with a single batch of cement in a  two-stage technique with the tibial component cemented first. The knee  was then brought  to 45 degrees of flexion with a 4 feeler gauge, held with pressure for a minute and half.  After this the femoral component was cemented in place.  The knee was again held at 45 degrees of flexion while the cement fully cured.  Excess cement was removed throughout the knee. Tourniquet was let down  after 31 minutes. After the cement had fully cured and excessive cement  was removed throughout the knee there was no visualized cement present.   The final left medial size 4 mm insert to match the X-small femur was chosen and snapped into position. We re-irrigated  the knee. The extensor mechanism  was then reapproximated using a #1 Vicryl and #0 Stratafix sutures with the knee in flexion. The  remaining wound was closed with 2-0 Vicryl and a running 4-0 Monocryl.  The knee was cleaned, dried, and dressed sterilely using Dermabond and  Aquacel dressing. The patient  was brought to the recovery room, Ace wrap in place, tolerating the  procedure well.  We will  initiate physical therapy and progress to ambulate. Discharge to home today possible if remains stable in PACU    Pietro Cassis. Alvan Dame, M.D.

## 2016-11-18 NOTE — Discharge Instructions (Addendum)
INSTRUCTIONS AFTER JOINT REPLACEMENT   o Remove items at home which could result in a fall. This includes throw rugs or furniture in walking pathways o ICE to the affected joint every three hours while awake for 30 minutes at a time, for at least the first 3-5 days, and then as needed for pain and swelling.  Continue to use ice for pain and swelling. You may notice swelling that will progress down to the foot and ankle.  This is normal after surgery.  Elevate your leg when you are not up walking on it.   o Continue to use the breathing machine you got in the hospital (incentive spirometer) which will help keep your temperature down.  It is common for your temperature to cycle up and down following surgery, especially at night when you are not up moving around and exerting yourself.  The breathing machine keeps your lungs expanded and your temperature down.   DIET:  As you were doing prior to hospitalization, we recommend a well-balanced diet.      DRESSING / WOUND CARE / SHOWERING  Keep the surgical dressing until follow up.  The dressing is water proof, so you can shower without any extra covering.  IF THE DRESSING FALLS OFF or the wound gets wet inside, change the dressing with sterile gauze.  Please use good hand washing techniques before changing the dressing.  Do not use any lotions or creams on the incision until instructed by your surgeon.    ACTIVITY  o Increase activity slowly as tolerated, but follow the weight bearing instructions below.   o No driving for 6 weeks or until further direction given by your physician.  You cannot drive while taking narcotics.  o No lifting or carrying greater than 10 lbs. until further directed by your surgeon. o Avoid periods of inactivity such as sitting longer than an hour when not asleep. This helps prevent blood clots.  o You may return to work once you are authorized by your doctor.     WEIGHT BEARING   Weight bearing as tolerated with  assist device (walker, cane, etc) as directed, use it as long as suggested by your surgeon or therapist, typically at least 4-6 weeks.   EXERCISES  Results after joint replacement surgery are often greatly improved when you follow the exercise, range of motion and muscle strengthening exercises prescribed by your doctor. Safety measures are also important to protect the joint from further injury. Any time any of these exercises cause you to have increased pain or swelling, decrease what you are doing until you are comfortable again and then slowly increase them. If you have problems or questions, call your caregiver or physical therapist for advice.   Rehabilitation is important following a joint replacement. After just a few days of immobilization, the muscles of the leg can become weakened and shrink (atrophy).  These exercises are designed to build up the tone and strength of the thigh and leg muscles and to improve motion. Often times heat used for twenty to thirty minutes before working out will loosen up your tissues and help with improving the range of motion but do not use heat for the first two weeks following surgery (sometimes heat can increase post-operative swelling).   These exercises can be done on a training (exercise) mat, on the floor, on a table or on a bed. Use whatever works the best and is most comfortable for you.    Use music or television while you  are exercising so that the exercises are a pleasant break in your day. This will make your life better with the exercises acting as a break in your routine that you can look forward to.   Perform all exercises about fifteen times, three times per day or as directed.  You should exercise both the operative leg and the other leg as well.  Exercises include:    Quad Sets - Tighten up the muscle on the front of the thigh (Quad) and hold for 5-10 seconds.    Straight Leg Raises - With your knee straight (if you were given a brace, keep  it on), lift the leg to 60 degrees, hold for 3 seconds, and slowly lower the leg.  Perform this exercise against resistance later as your leg gets stronger.   Leg Slides: Lying on your back, slowly slide your foot toward your buttocks, bending your knee up off the floor (only go as far as is comfortable). Then slowly slide your foot back down until your leg is flat on the floor again.   Angel Wings: Lying on your back spread your legs to the side as far apart as you can without causing discomfort.   Hamstring Strength:  Lying on your back, push your heel against the floor with your leg straight by tightening up the muscles of your buttocks.  Repeat, but this time bend your knee to a comfortable angle, and push your heel against the floor.  You may put a pillow under the heel to make it more comfortable if necessary.   A rehabilitation program following joint replacement surgery can speed recovery and prevent re-injury in the future due to weakened muscles. Contact your doctor or a physical therapist for more information on knee rehabilitation.    CONSTIPATION  Constipation is defined medically as fewer than three stools per week and severe constipation as less than one stool per week.  Even if you have a regular bowel pattern at home, your normal regimen is likely to be disrupted due to multiple reasons following surgery.  Combination of anesthesia, postoperative narcotics, change in appetite and fluid intake all can affect your bowels.   YOU MUST use at least one of the following options; they are listed in order of increasing strength to get the job done.  They are all available over the counter, and you may need to use some, POSSIBLY even all of these options:    Drink plenty of fluids (prune juice may be helpful) and high fiber foods Colace 100 mg by mouth twice a day  Senokot for constipation as directed and as needed Dulcolax (bisacodyl), take with full glass of water  Miralax (polyethylene  glycol) once or twice a day as needed.  If you have tried all these things and are unable to have a bowel movement in the first 3-4 days after surgery call either your surgeon or your primary doctor.    If you experience loose stools or diarrhea, hold the medications until you stool forms back up.  If your symptoms do not get better within 1 week or if they get worse, check with your doctor.  If you experience "the worst abdominal pain ever" or develop nausea or vomiting, please contact the office immediately for further recommendations for treatment.   ITCHING:  If you experience itching with your medications, try taking only a single pain pill, or even half a pain pill at a time.  You can also use Benadryl over the counter for  itching or also to help with sleep.   TED HOSE STOCKINGS:  Use stockings on both legs until for at least 2 weeks or as directed by physician office. They may be removed at night for sleeping.  MEDICATIONS:  See your medication summary on the After Visit Summary that nursing will review with you.  You may have some home medications which will be placed on hold until you complete the course of blood thinner medication.  It is important for you to complete the blood thinner medication as prescribed.  PRECAUTIONS:  If you experience chest pain or shortness of breath - call 911 immediately for transfer to the hospital emergency department.   If you develop a fever greater that 101 F, purulent drainage from wound, increased redness or drainage from wound, foul odor from the wound/dressing, or calf pain - CONTACT YOUR SURGEON.                                                   FOLLOW-UP APPOINTMENTS:  If you do not already have a post-op appointment, please call the office for an appointment to be seen by your surgeon.  Guidelines for how soon to be seen are listed in your After Visit Summary, but are typically between 1-4 weeks after surgery.  OTHER INSTRUCTIONS:   Knee  Replacement:  Do not place pillow under knee, focus on keeping the knee straight while resting.   MAKE SURE YOU:   Understand these instructions.   Get help right away if you are not doing well or get worse.    Thank you for letting us be a part of your medical care team.  It is a privilege we respect greatly.  We hope these instructions will help you stay on track for a fast and full recovery!       Spinal Anesthesia and Epidural Anesthesia, Care After These instructions give you information about caring for yourself after your procedure. Your doctor may also give you more specific instructions. Call your doctor if you have any problems or questions after your procedure. Follow these instructions at home: For at least 24 hours after the procedure:   Do not: ? Do activities where you could fall or get hurt (injured). ? Drive. ? Use heavy machinery. ? Drink alcohol. ? Take sleeping pills or medicines that make you sleepy (drowsy). ? Make important decisions. ? Sign legal documents. ? Take care of children on your own.  Rest. Eating and drinking  If you throw up (vomit), drink water, juice, or soup when you can drink without throwing up.  Make sure you do not feel like throwing up (are not nauseous) before you eat.  Follow the diet that is recommended by your doctor. General instructions  Have a responsible adult stay with you until you are awake and alert.  Take over-the-counter and prescription medicines only as told by your doctor.  If you smoke, do not smoke unless an adult is watching.  Keep all follow-up visits as told by your doctor. This is important. Contact a doctor if:  It has been more than one day since your procedure and you feel like throwing up.  It has been more than one day since your procedure and you throw up.  You have a rash. Get help right away if:  You have a fever.  You have a headache that lasts a long time.  You have a very bad  headache.  Your vision is blurry.  You see two of a single object (double vision).  You are dizzy or light-headed.  You faint.  Your arms or legs tingle, feel weak, or get numb.  You have trouble breathing.  You cannot pee (urinate). This information is not intended to replace advice given to you by your health care provider. Make sure you discuss any questions you have with your health care provider. Document Released: 05/14/2015 Document Revised: 09/13/2015 Document Reviewed: 05/14/2015 Elsevier Interactive Patient Education  [employer].

## 2016-11-18 NOTE — Anesthesia Procedure Notes (Signed)
Spinal  Patient location during procedure: OR Start time: 11/18/2016 10:24 AM Staffing Anesthesiologist: Janeece Riggers Resident/CRNA: [place] Aliha Diedrich C Performed: resident/CRNA  Preanesthetic Checklist Completed: patient identified, site marked, surgical consent, pre-op evaluation, timeout performed, IV checked, risks and benefits discussed and monitors and equipment checked Spinal Block Patient position: sitting Prep: Betadine Patient monitoring: heart rate, continuous pulse ox and blood pressure Approach: midline Location: L3-4 Injection technique: single-shot Needle Needle type: Sprotte  Needle gauge: 24 G Needle length: 9 cm Assessment Sensory level: T6 Additional Notes Expiration date of kit checked and confirmed. Patient tolerated procedure well, without complications.

## 2016-11-18 NOTE — Interval H&P Note (Signed)
History and Physical Interval Note:  11/18/2016 8:56 AM  [name] [name]  has presented today for surgery, with the diagnosis of Left knee medial compartmental osteoarthritis  The various methods of treatment have been discussed with the patient and family. After consideration of risks, benefits and other options for treatment, the patient has consented to  Procedure(s) with comments: LEFT UNICOMPARTMENTAL KNEE Medially (Left) - 90 mins as a surgical intervention .  The patient's history has been reviewed, patient examined, no change in status, stable for surgery.  I have reviewed the patient's chart and labs.  Questions were answered to the patient's satisfaction.     Mauri Pole

## 2016-11-18 NOTE — Anesthesia Procedure Notes (Signed)
Anesthesia Regional Block: Adductor canal block   Pre-Anesthetic Checklist: ,, timeout performed, Correct Patient, Correct Site, Correct Laterality, Correct Procedure, Correct Position, site marked, Risks and benefits discussed,  Surgical consent,  Pre-op evaluation,  At surgeon's request and post-op pain management  Laterality: Left  Prep: chloraprep       Needles:  Injection technique: Single-shot  Needle Type: Echogenic Stimulator Needle     Needle Length: 5cm  Needle Gauge: 22     Additional Needles:   Procedures:, nerve stimulator,,, ultrasound used (permanent image in chart), intact distal pulses,,,  Narrative:  Start time: 11/18/2016 9:42 AM End time: 11/18/2016 9:48 AM Injection made incrementally with aspirations every 5 mL.  Performed by: Personally  Anesthesiologist: Jerard Bays  Additional Notes: Functioning IV was confirmed and monitors were applied.  A 50mm 22ga Arrow echogenic stimulator needle was used. Sterile prep and drape,hand hygiene and sterile gloves were used. Ultrasound guidance: relevant anatomy identified, needle position confirmed, local anesthetic spread visualized around nerve(s)., vascular puncture avoided.  Image printed for medical record. Negative aspiration and negative test dose prior to incremental administration of local anesthetic. The patient tolerated the procedure well.  Patient experienced tachycardia with in 5 minutes of block.  Both Metoprolol and Esmolol were given tor slow heart rate with out any further side effects.

## 2016-11-21 DIAGNOSIS — M25562 Pain in left knee: Secondary | ICD-10-CM | POA: Diagnosis not present

## 2016-11-24 DIAGNOSIS — M25562 Pain in left knee: Secondary | ICD-10-CM | POA: Diagnosis not present

## 2016-11-28 ENCOUNTER — Other Ambulatory Visit: Payer: Self-pay | Admitting: Neurology

## 2016-12-01 DIAGNOSIS — M25562 Pain in left knee: Secondary | ICD-10-CM | POA: Diagnosis not present

## 2016-12-03 ENCOUNTER — Encounter: Payer: Medicare Other | Admitting: *Deleted

## 2016-12-03 DIAGNOSIS — Z96652 Presence of left artificial knee joint: Secondary | ICD-10-CM | POA: Diagnosis not present

## 2016-12-03 DIAGNOSIS — Z471 Aftercare following joint replacement surgery: Secondary | ICD-10-CM | POA: Diagnosis not present

## 2016-12-04 DIAGNOSIS — M25562 Pain in left knee: Secondary | ICD-10-CM | POA: Diagnosis not present

## 2016-12-05 ENCOUNTER — Encounter: Payer: Self-pay | Admitting: Cardiology

## 2016-12-08 DIAGNOSIS — M25562 Pain in left knee: Secondary | ICD-10-CM | POA: Diagnosis not present

## 2016-12-11 DIAGNOSIS — M25562 Pain in left knee: Secondary | ICD-10-CM | POA: Diagnosis not present

## 2016-12-12 ENCOUNTER — Ambulatory Visit (INDEPENDENT_AMBULATORY_CARE_PROVIDER_SITE_OTHER): Payer: Medicare Other | Admitting: *Deleted

## 2016-12-12 DIAGNOSIS — I495 Sick sinus syndrome: Secondary | ICD-10-CM

## 2016-12-15 NOTE — Progress Notes (Signed)
Remote pacemaker transmission.   

## 2016-12-16 LAB — CUP PACEART REMOTE DEVICE CHECK
Battery Voltage: 2.97 V
Brady Statistic AP VP Percent: 0.29 %
Brady Statistic RA Percent Paced: 0.29 %
Brady Statistic RV Percent Paced: 24.85 %
Implantable Lead Implant Date: 20140917
Implantable Lead Location: 753860
Implantable Pulse Generator Implant Date: 20140917
Lead Channel Impedance Value: 342 Ohm
Lead Channel Impedance Value: 342 Ohm
Lead Channel Impedance Value: 418 Ohm
Lead Channel Pacing Threshold Amplitude: 0.625 V
Lead Channel Pacing Threshold Pulse Width: 0.4 ms
Lead Channel Setting Pacing Amplitude: 2 V
Lead Channel Setting Sensing Sensitivity: 0.9 mV
MDC IDC LEAD IMPLANT DT: 20140917
MDC IDC LEAD LOCATION: 753859
MDC IDC MSMT BATTERY REMAINING LONGEVITY: 37 mo
MDC IDC MSMT LEADCHNL RA IMPEDANCE VALUE: 304 Ohm
MDC IDC MSMT LEADCHNL RA SENSING INTR AMPL: 0.25 mV
MDC IDC MSMT LEADCHNL RA SENSING INTR AMPL: 0.25 mV
MDC IDC MSMT LEADCHNL RV PACING THRESHOLD AMPLITUDE: 0.625 V
MDC IDC MSMT LEADCHNL RV PACING THRESHOLD PULSEWIDTH: 0.4 ms
MDC IDC MSMT LEADCHNL RV SENSING INTR AMPL: 13.625 mV
MDC IDC MSMT LEADCHNL RV SENSING INTR AMPL: 13.625 mV
MDC IDC SESS DTM: 20181109133204
MDC IDC SET LEADCHNL RV PACING AMPLITUDE: 2.5 V
MDC IDC SET LEADCHNL RV PACING PULSEWIDTH: 0.4 ms
MDC IDC STAT BRADY AP VS PERCENT: 0.07 %
MDC IDC STAT BRADY AS VP PERCENT: 24.07 %
MDC IDC STAT BRADY AS VS PERCENT: 75.57 %

## 2016-12-17 ENCOUNTER — Other Ambulatory Visit: Payer: Self-pay | Admitting: Internal Medicine

## 2016-12-18 ENCOUNTER — Encounter: Payer: Self-pay | Admitting: Cardiology

## 2016-12-18 DIAGNOSIS — M25562 Pain in left knee: Secondary | ICD-10-CM | POA: Diagnosis not present

## 2016-12-22 DIAGNOSIS — K760 Fatty (change of) liver, not elsewhere classified: Secondary | ICD-10-CM | POA: Diagnosis not present

## 2016-12-22 DIAGNOSIS — D509 Iron deficiency anemia, unspecified: Secondary | ICD-10-CM | POA: Diagnosis not present

## 2016-12-22 DIAGNOSIS — R140 Abdominal distension (gaseous): Secondary | ICD-10-CM | POA: Diagnosis not present

## 2016-12-22 DIAGNOSIS — M25562 Pain in left knee: Secondary | ICD-10-CM | POA: Diagnosis not present

## 2016-12-29 DIAGNOSIS — M25562 Pain in left knee: Secondary | ICD-10-CM | POA: Diagnosis not present

## 2017-01-01 DIAGNOSIS — Z96652 Presence of left artificial knee joint: Secondary | ICD-10-CM | POA: Diagnosis not present

## 2017-01-01 DIAGNOSIS — Z471 Aftercare following joint replacement surgery: Secondary | ICD-10-CM | POA: Diagnosis not present

## 2017-01-08 DIAGNOSIS — D62 Acute posthemorrhagic anemia: Secondary | ICD-10-CM | POA: Diagnosis not present

## 2017-01-08 DIAGNOSIS — Z6826 Body mass index (BMI) 26.0-26.9, adult: Secondary | ICD-10-CM | POA: Diagnosis not present

## 2017-01-08 DIAGNOSIS — K922 Gastrointestinal hemorrhage, unspecified: Secondary | ICD-10-CM | POA: Diagnosis not present

## 2017-01-08 DIAGNOSIS — K921 Melena: Secondary | ICD-10-CM | POA: Diagnosis not present

## 2017-01-09 DIAGNOSIS — Z7901 Long term (current) use of anticoagulants: Secondary | ICD-10-CM | POA: Diagnosis not present

## 2017-01-09 DIAGNOSIS — D509 Iron deficiency anemia, unspecified: Secondary | ICD-10-CM | POA: Diagnosis not present

## 2017-01-09 DIAGNOSIS — R195 Other fecal abnormalities: Secondary | ICD-10-CM | POA: Diagnosis not present

## 2017-01-09 DIAGNOSIS — I4891 Unspecified atrial fibrillation: Secondary | ICD-10-CM | POA: Diagnosis not present

## 2017-01-15 DIAGNOSIS — Z6826 Body mass index (BMI) 26.0-26.9, adult: Secondary | ICD-10-CM | POA: Diagnosis not present

## 2017-01-15 DIAGNOSIS — D62 Acute posthemorrhagic anemia: Secondary | ICD-10-CM | POA: Diagnosis not present

## 2017-01-15 DIAGNOSIS — R7301 Impaired fasting glucose: Secondary | ICD-10-CM | POA: Diagnosis not present

## 2017-01-15 DIAGNOSIS — K921 Melena: Secondary | ICD-10-CM | POA: Diagnosis not present

## 2017-01-15 DIAGNOSIS — K922 Gastrointestinal hemorrhage, unspecified: Secondary | ICD-10-CM | POA: Diagnosis not present

## 2017-01-16 DIAGNOSIS — Z7901 Long term (current) use of anticoagulants: Secondary | ICD-10-CM | POA: Diagnosis not present

## 2017-01-16 DIAGNOSIS — D509 Iron deficiency anemia, unspecified: Secondary | ICD-10-CM | POA: Diagnosis not present

## 2017-01-16 DIAGNOSIS — R195 Other fecal abnormalities: Secondary | ICD-10-CM | POA: Diagnosis not present

## 2017-01-16 DIAGNOSIS — I4891 Unspecified atrial fibrillation: Secondary | ICD-10-CM | POA: Diagnosis not present

## 2017-01-20 ENCOUNTER — Other Ambulatory Visit (HOSPITAL_COMMUNITY): Payer: Self-pay

## 2017-01-20 DIAGNOSIS — D62 Acute posthemorrhagic anemia: Secondary | ICD-10-CM | POA: Diagnosis not present

## 2017-01-20 DIAGNOSIS — R195 Other fecal abnormalities: Secondary | ICD-10-CM | POA: Diagnosis not present

## 2017-01-21 ENCOUNTER — Ambulatory Visit (HOSPITAL_COMMUNITY)
Admission: RE | Admit: 2017-01-21 | Discharge: 2017-01-21 | Disposition: A | Discharge status: Home / Self Care | Payer: Medicare Other | Source: Ambulatory Visit | Attending: Internal Medicine | Admitting: Internal Medicine

## 2017-01-21 DIAGNOSIS — D62 Acute posthemorrhagic anemia: Secondary | ICD-10-CM | POA: Diagnosis not present

## 2017-01-21 DIAGNOSIS — K922 Gastrointestinal hemorrhage, unspecified: Secondary | ICD-10-CM | POA: Diagnosis not present

## 2017-01-21 DIAGNOSIS — K219 Gastro-esophageal reflux disease without esophagitis: Secondary | ICD-10-CM | POA: Diagnosis not present

## 2017-01-21 DIAGNOSIS — I480 Paroxysmal atrial fibrillation: Secondary | ICD-10-CM | POA: Diagnosis not present

## 2017-01-21 DIAGNOSIS — D649 Anemia, unspecified: Secondary | ICD-10-CM | POA: Diagnosis not present

## 2017-01-21 DIAGNOSIS — Z6827 Body mass index (BMI) 27.0-27.9, adult: Secondary | ICD-10-CM | POA: Diagnosis not present

## 2017-01-21 MED ORDER — SODIUM CHLORIDE 0.9 % IV SOLN
510.0000 mg | Freq: Once | INTRAVENOUS | Status: DC
  Filled 2017-01-21: qty 17

## 2017-01-21 MED ORDER — SODIUM CHLORIDE 0.9 % IV SOLN
510.0000 mg | INTRAVENOUS | Status: DC
  Administered 2017-01-21: 510 mg via INTRAVENOUS
  Filled 2017-01-21: qty 17

## 2017-02-17 DIAGNOSIS — R195 Other fecal abnormalities: Secondary | ICD-10-CM | POA: Diagnosis not present

## 2017-02-17 DIAGNOSIS — D500 Iron deficiency anemia secondary to blood loss (chronic): Secondary | ICD-10-CM | POA: Diagnosis not present

## 2017-02-18 NOTE — Progress Notes (Signed)
[name] [name] was seen today in the movement disorders clinic for neurologic consultation at the request of Crist Infante, MD.  The consultation is for the evaluation of PD.  She has previously seen [name] and [name].  I have some of [name] notes.  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  She had a normal MRI brain at that time according to notes from Dr. love.  In 2011, her diagnosis was changed to Parkinson's disease and Azilect was added.  She had a second opinion at [facility] on 07/25/2009 and [name] also thought that she had Parkinson's disease.  In January, 2014 [name] added Artane, presumably for tremor.  She does think that it has helped the tremor.  02/22/13 update:  The pt is f/u today re: PD.  After our discussion last visit, she decided to stop the artane and states that while she noted a little increase in tremor, it hasn't been bad.  I had wanted to start her on carbidopa/levodopa 25/100 but she wanted to hold on that last vist.  Balance is good but she has felt more slow and stiff.  No falls.  No hallucinations.  No n/v.  No near syncope.  Not exercising faithfully.  06/20/13 update:  The pt is f/u today re: PD.  She was started on carbidopa/levodopa 25/100 last visit in January in addition to the azilect that she was already on.  She just returned from a trip from the [place] and was so surprised that she could do all of the planned excursions.  She felt great that she could do all the hiking and could get in and out of the zodiac boat.  She states that she is so glad she started taking the levodopa and thinks that her mood is better.  Her coordination and tremor are better but she still has some tremor.  Some minor stomach upset.  Pharmacist told her to take it with food and she admits that she is taking it with protein.  She last took levodopa at 7:30 am and was examined at 10am.  She is having difficulty staying asleep.  10/13/13 update:  Pt is on carbidopa/levodopa 25/100  and is supposed to be on one tid but admits that she is only taking 1/2 po bid. Did this because of GI upset.   She is having more tremor especially when walking in the L hand; she is not sure if it is any particular time of day.  It doesn't bother her too much.  Some increased difficulty buttoning clothing.  Has a URI and is leaving for [place] on Friday.  She is going for 3 weeks.    02/13/14 update:  The patient returns today for follow-up.  She reports that she is not moving as fluidly as she previously was.  Last time I told her that she needed to really increase her medication to one tablet 3 times per day but she did not do that and she is only taking one twice a day; interestingly she gets the first and the middle of the day dosage in and forgets the last.  She does get nighttime (leg/toe) cramping.  She did have a ventral hernia repair on 11/24/2013.  States that she had a difficult time recovering as she had a hematoma after.  Could not walk after surgery and has not been back to exercise but knows that she needs to.  No falls.  Little tremor.    06/07/14 update:  The patient is f/u for PD.  She is still on carbidopa/levodopa 25/100 twice per day.  She did better clinically on tid dosing but backed down on it to bid on her own.   She states that "sometimes" she takes the third dose but she often is now missing the middle of the day dose.  Sometimes, she only takes it once a day.   She is walking well; no falls.  She is exercising but states that she just had a cortisone injection into the left knee so that was a little limited.  She is having an ablation for a-fib next tues.  She states that it will be done at [facility] and it is a 3 hr procedure (because it is internal not external).  No lightheadedness.  She is still on azilect faithfully.  She takes klonopin about 1-2 times per week for insomnia.    10/11/14 update:  The patient follows up today for Parkinson's disease.  Last visit, I encouraged her  to go back up on her carbidopa/levodopa from twice per day to 3 times per day as she was more stiff and rigid last visit.  She may or may not do that but finds that most days she takes it bid. She has had some toe curling but is unsure if it is related to timing of meds.   She remains on Azilect as well as clonazepam for her insomnia.  She reports that she sleeps with this but she admits she doesn't take it regularly.  After some discussion today, we realized that she acidentally was taking temazepam instead of the klonopin.    She reports that she had another cardiac ablation since last visit and thought that it originally didn't work but now she thinks that it is.    02/14/15 update:  The patient follows up today.  She is supposed to be on carbidopa/levodopa 25/100, 3 times a day, but she generally takes it twice a day.  She is on Azilect.  Since our last visit, she has started amiodarone.  She denies falls.  She denies hallucinations.  She denies lightheadedness or near syncope.  Her husband thinks that her voice isn't as good.  She has had some SOB and wonders if it isn't from the amiodarone (but thinks that she recently had some altitidue sickness as well skiing in the mountains); she is f/u with Dr. Rayann Heman and is to have a CXR.  She does think that SOB is getting better.  She has not been able to exercise as much because of the SOB.  They have moved to the new villas at [facility] [place].  She is having some trouble sleeping even with klonopin.    06/14/15 update:  The patient is following up today.  I have reviewed records since previous visit.  Last visit, encouraged her to take carbidopa/levodopa 25/100, 3 times per day, as she was often only taking it twice per day, with the second dosage at bedtime.  States that she still has trouble remembering to take it and she often only takes it once a day.   She remains on Azilect.  She is also on clonazepam for insomnia but is not using it faithfully because she  doesn't think that it helps.  Mood has been okay, but admits that it goes up and down depending on how she is doing medically.  Because atrial arrhythmias, she was started on amiodarone in November, 2016.  This was increased on 05/28/2015 to 200 mg twice  a day but it was decreased back to once per day yesterday.  Pt states that she has been so SOB and she was told that it was likely from the amiodarone.  Because of SOB, she hasn't been able to exercise.  She has had 3 falls.  States that with one she was walking and talking to someone behind her and tripped over a curb.  With one, she fell when an elevator door closed on her.  With one fall, her shoe came off and she tripped.  No hallucinations.    11/05/15 update:  The patient follows up today for her Parkinson's disease.  She has been noncompliant with levodopa dosing, and I again encouraged her last visit to take carbidopa/levodopa 25/100, one tablet 3 times per day.  She isn't doing this and is only taking one per day.   She states that she is doing well, however.  She had 2 falls since our last visit but states that during her PT screen she did well and wasn't considered a fall risk.  With the falls, she was outside on an uneven surface and just tripped.  She is on Azilect, 1 mg daily.  Remains on amiodarone (but had dx of PD prior to its use) but dose reduced at end of Aug by Dr. Rayann Heman and if she continues to do well, plans are to d/c that medication.  I reviewed cardiology records.  No falls since last visit.  Had a pleural effusion and subsequent thoracentesis.  No malignant cells on cytology.  States that she just had another xray with Dr. Joylene Draft.  Sleeping better than she was previously  02/06/16 update:  The patient follows up today.  She is on carbidopa/levodopa 25/100, one tablet three times per day.  She is more faithful about tid but the last one is at bedtime, as she states that it helps her relax.   She remains on Azilect, 1 mg daily.  The records  that were made available to me were reviewed.  Dr. Rayann Heman d/c her amiodarone at the end of November.  "I feel much better off of that medication."   2 falls by tripping on shoes.  Didn't get hurt.   Pt denies lightheadedness, near syncope.  No hallucinations.  Mood has been good.  Exercising some with PT exercises.    06/04/16 update:  Patient seen today in follow-up.  She is on carbidopa/levodopa 25/100, one tablet 3 times per day, but often times the last one is at bedtime.  She reports more cramping of the legs in the middle of the night.  She remains on Azilect, 1 mg daily.  She denies any falls.  She denies lightheadedness or near syncope.  No hallucinations.  Referrals were sent for the patient to attend Parkinson's therapy rehabilitation in February, but it looks like she was called and she did not call back to schedule.  10/09/16 update:  Patient seen today in follow-up for Parkinson's disease.  She is on carbidopa/levodopa 25/100, one tablet 3 times per day.  Last visit, I added carbidopa/levodopa 50/200 at night.  She states that it didn't initially make a difference but her cramping is much better now.  She admits that she is taking medication more regularly.  She is still on Azilect.  No falls.  She has been better lately about exercise.  Her husband just had CABG 3 weeks ago.  Takes klonopin but not faithfully.    02/19/17 update: Patient is seen today in follow-up for  Parkinson's disease.  Patient is on carbidopa/levodopa, 1 tablet 3 times per day and carbidopa/levodopa 50/200 at night.  States that sometimes the pharmacy will mess those 2 RXs up.  She is also on Azilect.  Pt denies falls.  Pt denies lightheadedness, near syncope.  No hallucinations.  Mood has been good.  Rarely will take klonopin.  Thinks it works better if she takes it prn.  The records that were made available to me were reviewed.  She had a knee replacement on the L in October.  She recovered really well but it is still a little  sore.   Went to [place] in [place] for [place].  She did get a recumbant bike for xmas.    PREVIOUS MEDICATIONS: Azilect and artane  ALLERGIES:   Allergies  Allergen Reactions  . Statins     Leg weakness - but tolerating low dose Crestor every other day    CURRENT MEDICATIONS:  Current Outpatient Medications on File Prior to Visit  Medication Sig Dispense Refill  . CALCIUM PO Take 1 tablet by mouth at bedtime.     . Calcium Polycarbophil (FIBERCON PO) Take 2 tablets by mouth every morning.     . carbidopa-levodopa (SINEMET CR) 50-200 MG tablet TAKE 1 TABLET BY MOUTH EVERYDAY AT BEDTIME 90 tablet 1  . carbidopa-levodopa (SINEMET IR) 25-100 MG tablet TAKE 1 TABLET BY MOUTH 3 (THREE) TIMES DAILY. 90 tablet 5  . clonazePAM (KLONOPIN) 0.5 MG tablet Take 1 tablet (0.5 mg total) by mouth at bedtime. (Patient taking differently: Take 0.5 mg by mouth at bedtime as needed for anxiety. ) 30 tablet 3  . Coenzyme Q10 (CO Q 10 PO) Take 1 tablet by mouth every morning.     Mariane Baumgarten Sodium (COLACE PO) Take 1 capsule by mouth 2 (two) times daily as needed (constipation).     [name] ELIQUIS 5 MG TABS tablet Take 5 mg by mouth 2 (two) times daily.     [name] ezetimibe (ZETIA) 10 MG tablet Take 10 mg by mouth every other day. Taking the same day as the crestor    . ferrous sulfate (FERROUSUL) 325 (65 FE) MG tablet Take 1 tablet (325 mg total) by mouth 3 (three) times daily with meals.  3  . furosemide (LASIX) 40 MG tablet TAKE 1 TABLET TWICE A DAY 180 tablet 3  . levothyroxine (SYNTHROID, LEVOTHROID) 100 MCG tablet Take 50-100 mcg by mouth See admin instructions. Take 100 mg by mouth on Tuesday, Thursday, Saturday and Sunday. Take 50 mg by mouth on Monday, Wednesday and Friday  2  . MAGNESIUM PO Take 1 tablet by mouth at bedtime.     . methocarbamol (ROBAXIN) 500 MG tablet Take 1 tablet (500 mg total) by mouth every 6 (six) hours as needed for muscle spasms. 40 tablet 0  . metoprolol succinate  (TOPROL-XL) 25 MG 24 hr tablet Take 1 tablet (25 mg total) by mouth daily. 90 tablet 3  . mometasone (ELOCON) 0.1 % cream Apply 1 application topically 2 (two) times daily as needed for rash or irritation. FOR 1-2 WEEKS  1  . multivitamin-lutein (OCUVITE-LUTEIN) CAPS capsule Take 1 capsule by mouth daily.    . pantoprazole (PROTONIX) 40 MG tablet 1 TABLET ONCE A DAY APPROXIMATELY 30 MINUTES BEFORE BREAKFAST ORALLY 90  0  . polyethylene glycol (MIRALAX / GLYCOLAX) packet Take 17 g by mouth 2 (two) times daily. 14 each 0  . potassium chloride (K-DUR,KLOR-CON) 10 MEQ tablet Take 10 mEq  by mouth every morning.    . raloxifene (EVISTA) 60 MG tablet Take 60 mg by mouth daily.  3  . rasagiline (AZILECT) 1 MG TABS tablet TAKE 1 TABLET (1 MG TOTAL) BY MOUTH DAILY. 90 tablet 1  . rosuvastatin (CRESTOR) 5 MG tablet Take 2.5 mg by mouth every other day.    . triamcinolone (KENALOG) 0.025 % ointment Apply 1 application topically 2 (two) times daily. Do not use for more than 5 days in a row. 30 g 1  . valACYclovir (VALTREX) 1000 MG tablet Take 1,000 mg by mouth 2 (two) times daily as needed (fever blisters).    [name] VITAMIN D, ERGOCALCIFEROL, PO Take 1,000 Units by mouth daily.      No current facility-administered medications on file prior to visit.     PAST MEDICAL HISTORY:   Past Medical History:  Diagnosis Date  . Anemia   . Arthritis    thumbs  . Atrial tachycardia ([place])   . Atypical atrial flutter ([place])   . Cataract    bilateral  . Chronic anticoagulation    on Xarelto  . Chronic diastolic CHF (congestive heart failure) (HCC)    Echocardiogram (11/24/12): Mild LVH, EF 60%.  . Dysrhythmia   . GERD (gastroesophageal reflux disease)    hx of no meds  . Heart murmur    slight  . Hyperlipidemia    a. Hx of leg weakness while on statin. under control  . Hypertension   . Hypothyroidism   . Leg fracture Dec 21,2011  . Osteoporosis 07/2007  . Parkinson's disease ([place]) 2011  . Persistent atrial  fibrillation (HCC)    s/p atrial fib ablation 11-11-11.   [name] PONV (postoperative nausea and vomiting)   . Presence of permanent cardiac pacemaker    permanent Medtronic  . Shortness of breath    "when walking at incline or stairs"  . Sinus brady-tachy syndrome ([place])   . Squamous carcinoma summer 2015   back of left thigh  . Wrist fracture    right    PAST SURGICAL HISTORY:   Past Surgical History:  Procedure Laterality Date  . ATRIAL FIBRILLATION ABLATION N/A 11/11/2011   Procedure: ATRIAL FIBRILLATION ABLATION;  Surgeon: Thompson Grayer, MD;  Location[facility] CATH LAB;  Service: Cardiovascular;  Laterality: N/A;  . BREAST BIOPSY  1998  . BREAST SURGERY     benign cyst removed  . CARDIAC ELECTROPHYSIOLOGY STUDY AND ABLATION  5/14, 7/14   convergent AF ablation at [facility] and subsequent atypical atrial flutter ablation by Dr Lehman Prom  . CARDIOVASCULAR STRESS TEST  06-06-2008   EF 73%  . CARDIOVERSION  09/09/2011   Procedure: CARDIOVERSION;  Surgeon: Darlin Coco, MD;  Location: [facility] ENDOSCOPY;  Service: Cardiovascular;  Laterality: N/A;  to be done by dr. Mare Ferrari  . CARDIOVERSION  02/18/2012   Procedure: CARDIOVERSION;  Surgeon: Thayer Headings, MD;  Location: [facility];  Service: Cardiovascular;  Laterality: N/A;  . CHOLECYSTECTOMY  1995  . FEMUR FRACTURE SURGERY  2011   metal pin in place  . FOREARM SURGERY Left 2010   "opened arm to drain it"  . INSERTION OF MESH N/A 11/24/2013   Procedure: INSERTION OF MESH;  Surgeon: [name] Boston, MD;  Location: [facility] ORS;  Service: General;  Laterality: N/A;  . KNEE SURGERY Left 2001  . Left unicompartmental knee     Dr. Alvan Dame 11/18/16  . PACEMAKER PLACEMENT  10/20/12   MDT Advisa DR implanted by Dr Lehman Prom at [facility]   .  PARTIAL KNEE ARTHROPLASTY Left 11/18/2016   Procedure: LEFT UNICOMPARTMENTAL KNEE Medially;  Surgeon: Paralee Cancel, MD;  Location: [facility] ORS;  Service: Orthopedics;  Laterality: Left;  90 mins  . SKIN CANCER DESTRUCTION  summer 2015  . TEE  WITHOUT CARDIOVERSION  11/10/2011   Procedure: TRANSESOPHAGEAL ECHOCARDIOGRAM (TEE);  Surgeon: Thayer Headings, MD;  Location: [place];  Service: Cardiovascular;  Laterality: N/A;  . TONSILLECTOMY  as child  . US ECHOCARDIOGRAPHY  03-10-2008   Est EF 55-60%, Dr. Cathie Olden  . VENTRAL HERNIA REPAIR N/A 11/24/2013   Procedure: LAPAROSCOPIC VENTRAL HERNIA;  Surgeon: [name] Boston, MD;  Location: [facility];  Service: General;  Laterality: N/A;  . WRIST SURGERY Right ~2009   metal rod in place    SOCIAL HISTORY:   Social History   Socioeconomic History  . Marital status: Married    Spouse name: Not on file  . Number of children: Not on file  . Years of education: Not on file  . Highest education level: Not on file  Social Needs  . Financial resource strain: Not on file  . Food insecurity - worry: Not on file  . Food insecurity - inability: Not on file  . Transportation needs - medical: Not on file  . Transportation needs - non-medical: Not on file  Occupational History  . Not on file  Tobacco Use  . Smoking status: Never Smoker  . Smokeless tobacco: Never Used  Substance and Sexual Activity  . Alcohol use: No    Comment: 1-2 glasses of wine daily  . Drug use: No  . Sexual activity: Yes    Partners: Male    Birth control/protection: Post-menopausal  Other Topics Concern  . Not on file  Social History Narrative   Lives in [place].  Retired Licensed conveyancer.    FAMILY HISTORY:   Family Status  Relation Name Status  . Mother  Deceased at age 32       AD   . Father  Deceased at age 85       Valve-rheumatic fever as child  . Child  Alive       healthy  . Child  Alive       healthy  . Mat Aunt  (Not Specified)    ROS:  A complete 10 system review of systems was obtained and was unremarkable apart from what is mentioned above.  PHYSICAL EXAMINATION:    VITALS:   Vitals:   02/19/17 1313  BP: 112/68  Pulse: 86  SpO2: 95%  Weight: 150 lb (68 kg)  Height: 5\' 2"  (1.575 m)     GEN:  The patient appears stated age and is in NAD. HEENT:  Normocephalic, atraumatic.  The mucous membranes are moist. The superficial temporal arteries are without ropiness or tenderness. CV: irrgegular rate Lungs:  CTAB  Neurological examination:  Orientation: The patient is alert and oriented x3. Fund of knowledge is appropriate.   CN's:  There is good facial symmetry.  EOMI.  Speech is fluent and clear.  Soft palate rises symmetrically and there is no tongue deviation.     Movement examination: Tone: There is normal tone  Abnormal movements:   There is intermittent tremor of the RUEwith ambulation only.  Mild dyskinesia in the L leg>R leg Coordination:  There is mild decremation with finger taps and alternation of supination/pronation on the L Gait and Station: The patient has no difficulty arising out of a deep-seated chair without the use of the hands. The patient's  stride length is good.  She has no re-emergent tremor today.  ASSESSMENT/PLAN:  1.  idiopathic tremor predominant Parkinson's disease  -continue carbidopa/levodopa 25/100 tid.    -continue carbidopa/levodopa 50/200 at bed.  -She will remain on the Azilect.  -cardiology d/c amiodarone in 12/2015  -she is going to [place] in July. 2.  Insomnia.  -takes klonopin prn.  Can try remeron in the future if needed 3.  Follow up is anticipated in the next few months, sooner should new neurologic issues arise.  Much greater than 50% of this visit was spent in counseling and coordinating care.  Total face to face time:  25 min

## 2017-02-19 ENCOUNTER — Encounter: Payer: Self-pay | Admitting: Neurology

## 2017-02-19 ENCOUNTER — Ambulatory Visit (INDEPENDENT_AMBULATORY_CARE_PROVIDER_SITE_OTHER): Payer: Medicare Other | Admitting: Neurology

## 2017-02-19 VITALS — BP 112/68 | HR 86 | Ht 62.0 in | Wt 150.0 lb

## 2017-02-19 DIAGNOSIS — G20 Parkinson's disease: Secondary | ICD-10-CM | POA: Diagnosis not present

## 2017-02-19 MED ORDER — CLONAZEPAM 0.5 MG PO TABS: 0.5000 mg | ORAL_TABLET | Freq: Every day | ORAL | 1 refills | Status: DC

## 2017-02-19 NOTE — Addendum Note (Signed)
Addended byAnnamaria Helling on: 02/19/2017 02:12 PM   Modules accepted: Orders

## 2017-03-04 DIAGNOSIS — D500 Iron deficiency anemia secondary to blood loss (chronic): Secondary | ICD-10-CM | POA: Diagnosis not present

## 2017-03-09 ENCOUNTER — Encounter: Payer: Self-pay | Admitting: Internal Medicine

## 2017-03-09 ENCOUNTER — Ambulatory Visit (INDEPENDENT_AMBULATORY_CARE_PROVIDER_SITE_OTHER): Payer: Medicare Other | Admitting: Internal Medicine

## 2017-03-09 VITALS — BP 116/60 | HR 79 | Ht 62.0 in | Wt 147.0 lb

## 2017-03-09 DIAGNOSIS — I495 Sick sinus syndrome: Secondary | ICD-10-CM

## 2017-03-09 DIAGNOSIS — R0602 Shortness of breath: Secondary | ICD-10-CM

## 2017-03-09 DIAGNOSIS — I481 Persistent atrial fibrillation: Secondary | ICD-10-CM

## 2017-03-09 DIAGNOSIS — I10 Essential (primary) hypertension: Secondary | ICD-10-CM

## 2017-03-09 DIAGNOSIS — Z950 Presence of cardiac pacemaker: Secondary | ICD-10-CM | POA: Diagnosis not present

## 2017-03-09 DIAGNOSIS — I4819 Other persistent atrial fibrillation: Secondary | ICD-10-CM

## 2017-03-09 LAB — CUP PACEART INCLINIC DEVICE CHECK
Battery Voltage: 2.98 V
Brady Statistic AP VS Percent: 0.18 %
Brady Statistic AS VP Percent: 25.11 %
Brady Statistic AS VS Percent: 73.66 %
Implantable Lead Implant Date: 20140917
Implantable Lead Location: 753860
Lead Channel Impedance Value: 380 Ohm
Lead Channel Pacing Threshold Amplitude: 1 V
Lead Channel Pacing Threshold Pulse Width: 0.4 ms
Lead Channel Sensing Intrinsic Amplitude: 17.375 mV
Lead Channel Setting Pacing Amplitude: 2 V
Lead Channel Setting Sensing Sensitivity: 0.9 mV
MDC IDC LEAD IMPLANT DT: 20140917
MDC IDC LEAD LOCATION: 753859
MDC IDC MSMT BATTERY REMAINING LONGEVITY: 45 mo
MDC IDC MSMT LEADCHNL RA IMPEDANCE VALUE: 342 Ohm
MDC IDC MSMT LEADCHNL RA IMPEDANCE VALUE: 361 Ohm
MDC IDC MSMT LEADCHNL RA SENSING INTR AMPL: 1.625 mV
MDC IDC MSMT LEADCHNL RV IMPEDANCE VALUE: 456 Ohm
MDC IDC PG IMPLANT DT: 20140917
MDC IDC SESS DTM: 20190204151537
MDC IDC SET LEADCHNL RV PACING AMPLITUDE: 2.5 V
MDC IDC SET LEADCHNL RV PACING PULSEWIDTH: 0.4 ms
MDC IDC STAT BRADY AP VP PERCENT: 1.05 %
MDC IDC STAT BRADY RA PERCENT PACED: 1 %
MDC IDC STAT BRADY RV PERCENT PACED: 26.56 %

## 2017-03-09 NOTE — Progress Notes (Signed)
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 female who presents today for routine electrophysiology followup.  Since last being seen in our clinic, the patient reports doing very well.  Today, she denies symptoms of palpitations, chest pain, shortness of breath,  lower extremity edema, dizziness, presyncope, or syncope.  The patient is otherwise without complaint today.   Past Medical History:  Diagnosis Date  . Anemia   . Arthritis    thumbs  . Atrial tachycardia ([place])   . Atypical atrial flutter ([place])   . Cataract    bilateral  . Chronic anticoagulation    on Xarelto  . Chronic diastolic CHF (congestive heart failure) (HCC)    Echocardiogram (11/24/12): Mild LVH, EF 60%.  . Dysrhythmia   . GERD (gastroesophageal reflux disease)    hx of no meds  . Heart murmur    slight  . Hyperlipidemia    a. Hx of leg weakness while on statin. under control  . Hypertension   . Hypothyroidism   . Leg fracture Dec 21,2011  . Osteoporosis 07/2007  . Parkinson's disease ([place]) 2011  . Persistent atrial fibrillation (HCC)    s/p atrial fib ablation 11-11-11.   [name] PONV (postoperative nausea and vomiting)   . Presence of permanent cardiac pacemaker    permanent [employer]  . Shortness of breath    "when walking at incline or stairs"  . Sinus brady-tachy syndrome ([place])   . Squamous carcinoma summer 2015   back of left thigh  . Wrist fracture    right   Past Surgical History:  Procedure Laterality Date  . ATRIAL FIBRILLATION ABLATION N/A 11/11/2011   Procedure: ATRIAL FIBRILLATION ABLATION;  Surgeon: Thompson Grayer, MD;  Location: [facility] CATH LAB;  Service: Cardiovascular;  Laterality: N/A;  . BREAST BIOPSY  1998  . BREAST SURGERY     benign cyst removed  . CARDIAC ELECTROPHYSIOLOGY STUDY AND ABLATION  5/14, 7/14   convergent AF ablation at [facility] and subsequent atypical atrial flutter ablation by Dr Lehman Prom  . CARDIOVASCULAR STRESS TEST  06-06-2008   EF 73%  .  CARDIOVERSION  09/09/2011   Procedure: CARDIOVERSION;  Surgeon: Darlin Coco, MD;  Location: [facility] ENDOSCOPY;  Service: Cardiovascular;  Laterality: N/A;  to be done by dr. Mare Ferrari  . CARDIOVERSION  02/18/2012   Procedure: CARDIOVERSION;  Surgeon: Thayer Headings, MD;  Location: [place];  Service: Cardiovascular;  Laterality: N/A;  . CHOLECYSTECTOMY  1995  . FEMUR FRACTURE SURGERY  2011   metal pin in place  . FOREARM SURGERY Left 2010   "opened arm to drain it"  . INSERTION OF MESH N/A 11/24/2013   Procedure: INSERTION OF MESH;  Surgeon: Michael Boston, MD;  Location: [facility];  Service: General;  Laterality: N/A;  . KNEE SURGERY Left 2001  . Left unicompartmental knee     Dr. Alvan Dame 11/18/16  . PACEMAKER PLACEMENT  10/20/12   MDT Advisa DR implanted by Dr Lehman Prom at [facility]   . PARTIAL KNEE ARTHROPLASTY Left 11/18/2016   Procedure: LEFT UNICOMPARTMENTAL KNEE Medially;  Surgeon: Paralee Cancel, MD;  Location: [facility];  Service: Orthopedics;  Laterality: Left;  90 mins  . SKIN CANCER DESTRUCTION  summer 2015  . TEE WITHOUT CARDIOVERSION  11/10/2011   Procedure: TRANSESOPHAGEAL ECHOCARDIOGRAM (TEE);  Surgeon: Thayer Headings, MD;  Location: [place];  Service: Cardiovascular;  Laterality: N/A;  . TONSILLECTOMY  as child  . US ECHOCARDIOGRAPHY  03-10-2008   Est EF 55-60%, Dr. Cathie Olden  . VENTRAL HERNIA REPAIR N/A 11/24/2013   Procedure: LAPAROSCOPIC VENTRAL HERNIA;  Surgeon: Michael Boston, MD;  Location: [facility];  Service: General;  Laterality: N/A;  . WRIST SURGERY Right ~2009   metal rod in place    ROS- all systems are reviewed and negative except as per HPI above  Current Outpatient Medications  Medication Sig Dispense Refill  . CALCIUM PO Take 1 tablet by mouth at bedtime.     . Calcium Polycarbophil (FIBERCON PO) Take 2 tablets by mouth every morning.     . carbidopa-levodopa (SINEMET CR) 50-200 MG tablet TAKE 1 TABLET BY MOUTH EVERYDAY AT BEDTIME 90 tablet 1  . carbidopa-levodopa  (SINEMET IR) 25-100 MG tablet TAKE 1 TABLET BY MOUTH 3 (THREE) TIMES DAILY. 90 tablet 5  . clonazePAM (KLONOPIN) 0.5 MG tablet Take 1 tablet (0.5 mg total) by mouth at bedtime. 90 tablet 1  . Coenzyme Q10 (CO Q 10 PO) Take 1 tablet by mouth every morning.     Mariane Baumgarten Sodium (COLACE PO) Take 1 capsule by mouth 2 (two) times daily as needed (constipation).     [name] ELIQUIS 5 MG TABS tablet Take 5 mg by mouth 2 (two) times daily.     [name] ezetimibe (ZETIA) 10 MG tablet Take 10 mg by mouth every other day. Taking the same day as the crestor    . furosemide (LASIX) 40 MG tablet TAKE 1 TABLET TWICE A DAY 180 tablet 3  . levothyroxine (SYNTHROID, LEVOTHROID) 100 MCG tablet Take 50-100 mcg by mouth See admin instructions. Take 100 mg by mouth on Tuesday, Thursday, Saturday and Sunday. Take 50 mg by mouth on Monday, Wednesday and Friday  2  . MAGNESIUM PO Take 1 tablet by mouth at bedtime.     . metoprolol succinate (TOPROL-XL) 25 MG 24 hr tablet Take 1 tablet (25 mg total) by mouth daily. 90 tablet 3  . mometasone (ELOCON) 0.1 % cream Apply 1 application topically 2 (two) times daily as needed for rash or irritation. FOR 1-2 WEEKS  1  . multivitamin-lutein (OCUVITE-LUTEIN) CAPS capsule Take 1 capsule by mouth daily.    . pantoprazole (PROTONIX) 40 MG tablet 1 TABLET ONCE A DAY APPROXIMATELY 30 MINUTES BEFORE BREAKFAST ORALLY 90  0  . polyethylene glycol (MIRALAX / GLYCOLAX) packet Take 17 g by mouth 2 (two) times daily. (Patient taking differently: Take 17 g by mouth daily as needed. ) 14 each 0  . potassium chloride (K-DUR,KLOR-CON) 10 MEQ tablet Take 10 mEq by mouth every morning.    . Probiotic Product (ALIGN PO) Take 1 tablet by mouth daily.    . raloxifene (EVISTA) 60 MG tablet Take 60 mg by mouth daily.  3  . rasagiline (AZILECT) 1 MG TABS tablet TAKE 1 TABLET (1 MG TOTAL) BY MOUTH DAILY. 90 tablet 1  . rosuvastatin (CRESTOR) 5 MG tablet Take 2.5 mg by mouth every other day.    . triamcinolone  (KENALOG) 0.025 % ointment Apply 1 application topically 2 (two) times daily. Do not use for more than 5 days in a row. 30 g 1  . valACYclovir (VALTREX) 1000 MG tablet Take 1,000 mg by mouth 2 (two) times daily as needed (fever blisters).    [name] VITAMIN D, ERGOCALCIFEROL, PO Take 1,000 Units by mouth daily.      No current facility-administered medications for this visit.     Physical Exam: Vitals:   03/09/17 1052  BP:  116/60  Pulse: 79  Weight: 147 lb (66.7 kg)  Height: 5\' 2"  (1.575 m)    GEN- The patient is well appearing, alert and oriented x 3 today.   Head- normocephalic, atraumatic Eyes-  Sclera clear, conjunctiva pink Ears- hearing intact Oropharynx- clear Lungs- Clear to ausculation bilaterally, normal work of breathing Chest- pacemaker pocket is well healed Heart- irregular rate and rhythm, no murmurs, rubs or gallops, PMI not laterally displaced GI- soft, NT, ND, + BS Extremities- no clubbing, cyanosis, or edema  Pacemaker interrogation- reviewed in detail today,  See PACEART report   Assessment and Plan:  1. Symptomatic bradycardia  Normal pacemaker function See Pace Art report No changes today  2. longstanding Persistent atrial fibrillation Rate controlled Continue long term anticoagulation  3. HTN Stable No change required today  4. SOB Improved with iron infusions  [facility] Return in 6 months to see EP PA  Thompson Grayer MD, [facility] 03/09/2017 11:13 AM

## 2017-03-09 NOTE — Patient Instructions (Addendum)
Medication Instructions:  Your physician recommends that you continue on your current medications as directed. Please refer to the Current Medication list given to you today.  Labwork: None ordered.  Testing/Procedures: None ordered.  Follow-Up: Your physician wants you to follow-up in: 6 months with Tommye Standard, PA.   You will receive a reminder letter in the mail two months in advance. If you don't receive a letter, please call our office to schedule the follow-up appointment.  Remote monitoring is used to monitor your Pacemaker from home. This monitoring reduces the number of office visits required to check your device to one time per year. It allows [place] to keep an eye on the functioning of your device to ensure it is working properly. You are scheduled for a device check from home on 03/16/2017. You may send your transmission at any time that day. If you have a wireless device, the transmission will be sent automatically. After your physician reviews your transmission, you will receive a postcard with your next transmission date.  Any Other Special Instructions Will Be Listed Below (If Applicable).  If you need a refill on your cardiac medications before your next appointment, please call your pharmacy.

## 2017-03-16 ENCOUNTER — Other Ambulatory Visit: Payer: Self-pay | Admitting: Internal Medicine

## 2017-03-16 ENCOUNTER — Telehealth: Payer: Self-pay | Admitting: Cardiology

## 2017-03-16 ENCOUNTER — Encounter: Payer: Medicare Other | Admitting: *Deleted

## 2017-03-16 DIAGNOSIS — Z1231 Encounter for screening mammogram for malignant neoplasm of breast: Secondary | ICD-10-CM

## 2017-03-16 NOTE — Telephone Encounter (Signed)
LMOVM reminding pt to send remote transmission.   

## 2017-03-17 ENCOUNTER — Other Ambulatory Visit: Payer: Self-pay | Admitting: Gastroenterology

## 2017-03-17 ENCOUNTER — Ambulatory Visit
Admission: RE | Admit: 2017-03-17 | Discharge: 2017-03-17 | Disposition: A | Discharge status: Home / Self Care | Payer: Medicare Other | Source: Ambulatory Visit | Attending: Gastroenterology | Admitting: Gastroenterology

## 2017-03-17 DIAGNOSIS — R195 Other fecal abnormalities: Secondary | ICD-10-CM

## 2017-03-17 DIAGNOSIS — Z1810 Retained metal fragments, unspecified: Secondary | ICD-10-CM | POA: Diagnosis not present

## 2017-03-18 ENCOUNTER — Encounter: Payer: Self-pay | Admitting: Cardiology

## 2017-03-25 DIAGNOSIS — D500 Iron deficiency anemia secondary to blood loss (chronic): Secondary | ICD-10-CM | POA: Diagnosis not present

## 2017-03-25 DIAGNOSIS — K760 Fatty (change of) liver, not elsewhere classified: Secondary | ICD-10-CM | POA: Diagnosis not present

## 2017-03-25 DIAGNOSIS — R195 Other fecal abnormalities: Secondary | ICD-10-CM | POA: Diagnosis not present

## 2017-03-30 ENCOUNTER — Ambulatory Visit (INDEPENDENT_AMBULATORY_CARE_PROVIDER_SITE_OTHER): Payer: Medicare Other | Admitting: *Deleted

## 2017-03-30 DIAGNOSIS — I495 Sick sinus syndrome: Secondary | ICD-10-CM | POA: Diagnosis not present

## 2017-03-31 NOTE — Progress Notes (Signed)
Remote pacemaker transmission.   

## 2017-04-02 ENCOUNTER — Encounter: Payer: Self-pay | Admitting: Cardiology

## 2017-04-14 DIAGNOSIS — E038 Other specified hypothyroidism: Secondary | ICD-10-CM | POA: Diagnosis not present

## 2017-04-14 DIAGNOSIS — E7849 Other hyperlipidemia: Secondary | ICD-10-CM | POA: Diagnosis not present

## 2017-04-14 DIAGNOSIS — R7301 Impaired fasting glucose: Secondary | ICD-10-CM | POA: Diagnosis not present

## 2017-04-14 DIAGNOSIS — R82998 Other abnormal findings in urine: Secondary | ICD-10-CM | POA: Diagnosis not present

## 2017-04-14 DIAGNOSIS — D62 Acute posthemorrhagic anemia: Secondary | ICD-10-CM | POA: Diagnosis not present

## 2017-04-14 DIAGNOSIS — I10 Essential (primary) hypertension: Secondary | ICD-10-CM | POA: Diagnosis not present

## 2017-04-14 DIAGNOSIS — M810 Age-related osteoporosis without current pathological fracture: Secondary | ICD-10-CM | POA: Diagnosis not present

## 2017-04-14 LAB — CUP PACEART REMOTE DEVICE CHECK
Battery Voltage: 2.97 V
Brady Statistic AP VP Percent: 0.79 %
Brady Statistic AP VS Percent: 0.05 %
Brady Statistic AS VP Percent: 30.86 %
Brady Statistic AS VS Percent: 68.3 %
Brady Statistic RV Percent Paced: 31.92 %
Implantable Lead Implant Date: 20140917
Implantable Lead Location: 753859
Implantable Pulse Generator Implant Date: 20140917
Lead Channel Impedance Value: 361 Ohm
Lead Channel Impedance Value: 361 Ohm
Lead Channel Impedance Value: 456 Ohm
Lead Channel Pacing Threshold Amplitude: 1.25 V
Lead Channel Sensing Intrinsic Amplitude: 0.75 mV
Lead Channel Sensing Intrinsic Amplitude: 12.625 mV
Lead Channel Sensing Intrinsic Amplitude: 12.625 mV
Lead Channel Setting Pacing Amplitude: 2 V
Lead Channel Setting Pacing Amplitude: 2.5 V
Lead Channel Setting Sensing Sensitivity: 0.9 mV
MDC IDC LEAD IMPLANT DT: 20140917
MDC IDC LEAD LOCATION: 753860
MDC IDC MSMT BATTERY REMAINING LONGEVITY: 39 mo
MDC IDC MSMT LEADCHNL RA IMPEDANCE VALUE: 342 Ohm
MDC IDC MSMT LEADCHNL RA PACING THRESHOLD AMPLITUDE: 0.625 V
MDC IDC MSMT LEADCHNL RA PACING THRESHOLD PULSEWIDTH: 0.4 ms
MDC IDC MSMT LEADCHNL RA SENSING INTR AMPL: 0.75 mV
MDC IDC MSMT LEADCHNL RV PACING THRESHOLD PULSEWIDTH: 0.4 ms
MDC IDC SESS DTM: 20190225170535
MDC IDC SET LEADCHNL RV PACING PULSEWIDTH: 0.4 ms
MDC IDC STAT BRADY RA PERCENT PACED: 0.68 %

## 2017-04-21 DIAGNOSIS — M25562 Pain in left knee: Secondary | ICD-10-CM | POA: Diagnosis not present

## 2017-04-21 DIAGNOSIS — D259 Leiomyoma of uterus, unspecified: Secondary | ICD-10-CM | POA: Diagnosis not present

## 2017-04-21 DIAGNOSIS — Z1389 Encounter for screening for other disorder: Secondary | ICD-10-CM | POA: Diagnosis not present

## 2017-04-21 DIAGNOSIS — Z6827 Body mass index (BMI) 27.0-27.9, adult: Secondary | ICD-10-CM | POA: Diagnosis not present

## 2017-04-21 DIAGNOSIS — I509 Heart failure, unspecified: Secondary | ICD-10-CM | POA: Diagnosis not present

## 2017-04-21 DIAGNOSIS — G20 Parkinson's disease: Secondary | ICD-10-CM | POA: Diagnosis not present

## 2017-04-21 DIAGNOSIS — M810 Age-related osteoporosis without current pathological fracture: Secondary | ICD-10-CM | POA: Diagnosis not present

## 2017-04-21 DIAGNOSIS — Z950 Presence of cardiac pacemaker: Secondary | ICD-10-CM | POA: Diagnosis not present

## 2017-04-21 DIAGNOSIS — J90 Pleural effusion, not elsewhere classified: Secondary | ICD-10-CM | POA: Diagnosis not present

## 2017-04-21 DIAGNOSIS — E038 Other specified hypothyroidism: Secondary | ICD-10-CM | POA: Diagnosis not present

## 2017-04-21 DIAGNOSIS — I480 Paroxysmal atrial fibrillation: Secondary | ICD-10-CM | POA: Diagnosis not present

## 2017-04-21 DIAGNOSIS — Z0000 Encounter for general adult medical examination without abnormal findings: Secondary | ICD-10-CM | POA: Diagnosis not present

## 2017-04-23 ENCOUNTER — Ambulatory Visit
Admission: RE | Admit: 2017-04-23 | Discharge: 2017-04-23 | Disposition: A | Discharge status: Home / Self Care | Payer: Medicare Other | Source: Ambulatory Visit | Attending: Internal Medicine | Admitting: Internal Medicine

## 2017-04-23 DIAGNOSIS — Z1231 Encounter for screening mammogram for malignant neoplasm of breast: Secondary | ICD-10-CM

## 2017-05-03 ENCOUNTER — Other Ambulatory Visit: Payer: Self-pay | Admitting: Neurology

## 2017-05-05 ENCOUNTER — Other Ambulatory Visit: Payer: Self-pay | Admitting: Neurology

## 2017-05-20 ENCOUNTER — Other Ambulatory Visit: Payer: Self-pay | Admitting: Neurology

## 2017-06-08 DIAGNOSIS — H353132 Nonexudative age-related macular degeneration, bilateral, intermediate dry stage: Secondary | ICD-10-CM | POA: Diagnosis not present

## 2017-06-08 DIAGNOSIS — H5213 Myopia, bilateral: Secondary | ICD-10-CM | POA: Diagnosis not present

## 2017-06-08 DIAGNOSIS — H524 Presbyopia: Secondary | ICD-10-CM | POA: Diagnosis not present

## 2017-06-08 DIAGNOSIS — Z961 Presence of intraocular lens: Secondary | ICD-10-CM | POA: Diagnosis not present

## 2017-06-17 DIAGNOSIS — M810 Age-related osteoporosis without current pathological fracture: Secondary | ICD-10-CM | POA: Diagnosis not present

## 2017-06-22 NOTE — Progress Notes (Signed)
[name] [name] was seen today in the movement disorders clinic for neurologic consultation at the request of Crist Infante, MD.  The consultation is for the evaluation of PD.  She has previously seen [name] and [name].  I have some of [name] notes.  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  She had a normal MRI brain at that time according to notes from Dr. love.  In 2011, her diagnosis was changed to Parkinson's disease and Azilect was added.  She had a second opinion at [facility] on 07/25/2009 and [name] also thought that she had Parkinson's disease.  In January, 2014 [name] added Artane, presumably for tremor.  She does think that it has helped the tremor.  02/22/13 update:  The pt is f/u today re: PD.  After our discussion last visit, she decided to stop the artane and states that while she noted a little increase in tremor, it hasn't been bad.  I had wanted to start her on carbidopa/levodopa 25/100 but she wanted to hold on that last vist.  Balance is good but she has felt more slow and stiff.  No falls.  No hallucinations.  No n/v.  No near syncope.  Not exercising faithfully.  06/20/13 update:  The pt is f/u today re: PD.  She was started on carbidopa/levodopa 25/100 last visit in January in addition to the azilect that she was already on.  She just returned from a trip from the [place] and was so surprised that she could do all of the planned excursions.  She felt great that she could do all the hiking and could get in and out of the zodiac boat.  She states that she is so glad she started taking the levodopa and thinks that her mood is better.  Her coordination and tremor are better but she still has some tremor.  Some minor stomach upset.  Pharmacist told her to take it with food and she admits that she is taking it with protein.  She last took levodopa at 7:30 am and was examined at 10am.  She is having difficulty staying asleep.  10/13/13 update:  Pt is on carbidopa/levodopa 25/100  and is supposed to be on one tid but admits that she is only taking 1/2 po bid. Did this because of GI upset.   She is having more tremor especially when walking in the L hand; she is not sure if it is any particular time of day.  It doesn't bother her too much.  Some increased difficulty buttoning clothing.  Has a URI and is leaving for [place] on Friday.  She is going for 3 weeks.    02/13/14 update:  The patient returns today for follow-up.  She reports that she is not moving as fluidly as she previously was.  Last time I told her that she needed to really increase her medication to one tablet 3 times per day but she did not do that and she is only taking one twice a day; interestingly she gets the first and the middle of the day dosage in and forgets the last.  She does get nighttime (leg/toe) cramping.  She did have a ventral hernia repair on 11/24/2013.  States that she had a difficult time recovering as she had a hematoma after.  Could not walk after surgery and has not been back to exercise but knows that she needs to.  No falls.  Little tremor.    06/07/14 update:  The patient is f/u for PD.  She is still on carbidopa/levodopa 25/100 twice per day.  She did better clinically on tid dosing but backed down on it to bid on her own.   She states that "sometimes" she takes the third dose but she often is now missing the middle of the day dose.  Sometimes, she only takes it once a day.   She is walking well; no falls.  She is exercising but states that she just had a cortisone injection into the left knee so that was a little limited.  She is having an ablation for a-fib next tues.  She states that it will be done at [facility] and it is a 3 hr procedure (because it is internal not external).  No lightheadedness.  She is still on azilect faithfully.  She takes klonopin about 1-2 times per week for insomnia.    10/11/14 update:  The patient follows up today for Parkinson's disease.  Last visit, I encouraged her  to go back up on her carbidopa/levodopa from twice per day to 3 times per day as she was more stiff and rigid last visit.  She may or may not do that but finds that most days she takes it bid. She has had some toe curling but is unsure if it is related to timing of meds.   She remains on Azilect as well as clonazepam for her insomnia.  She reports that she sleeps with this but she admits she doesn't take it regularly.  After some discussion today, we realized that she acidentally was taking temazepam instead of the klonopin.    She reports that she had another cardiac ablation since last visit and thought that it originally didn't work but now she thinks that it is.    02/14/15 update:  The patient follows up today.  She is supposed to be on carbidopa/levodopa 25/100, 3 times a day, but she generally takes it twice a day.  She is on Azilect.  Since our last visit, she has started amiodarone.  She denies falls.  She denies hallucinations.  She denies lightheadedness or near syncope.  Her husband thinks that her voice isn't as good.  She has had some SOB and wonders if it isn't from the amiodarone (but thinks that she recently had some altitidue sickness as well skiing in the mountains); she is f/u with Dr. Rayann Heman and is to have a CXR.  She does think that SOB is getting better.  She has not been able to exercise as much because of the SOB.  [place] have moved to the new villas [place] [facility] [place].  She is having some trouble sleeping even with klonopin.    06/14/15 update:  The patient is following up today.  I have reviewed records since previous visit.  Last visit, encouraged her to take carbidopa/levodopa 25/100, 3 times per day, as she was often only taking it twice per day, with the second dosage at bedtime.  States that she still has trouble remembering to take it and she often only takes it once a day.   She remains on Azilect.  She is also on clonazepam for insomnia but is not using it faithfully because she  doesn't think that it helps.  Mood has been okay, but admits that it goes up and down depending on how she is doing medically.  Because atrial arrhythmias, she was started on amiodarone in November, 2016.  This was increased on 05/28/2015 to 200 mg twice  a day but it was decreased back to once per day yesterday.  Pt states that she has been so SOB and she was told that it was likely from the amiodarone.  Because of SOB, she hasn't been able to exercise.  She has had 3 falls.  States that with one she was walking and talking to someone behind her and tripped over a curb.  With one, she fell when an elevator door closed on her.  With one fall, her shoe came off and she tripped.  No hallucinations.    11/05/15 update:  The patient follows up today for her Parkinson's disease.  She has been noncompliant with levodopa dosing, and I again encouraged her last visit to take carbidopa/levodopa 25/100, one tablet 3 times per day.  She isn't doing this and is only taking one per day.   She states that she is doing well, however.  She had 2 falls since our last visit but states that during her PT screen she did well and wasn't considered a fall risk.  With the falls, she was outside on an uneven surface and just tripped.  She is on Azilect, 1 mg daily.  Remains on amiodarone (but had dx of PD prior to its use) but dose reduced at end of Aug by Dr. Rayann Heman and if she continues to do well, plans are to d/c that medication.  I reviewed cardiology records.  No falls since last visit.  Had a pleural effusion and subsequent thoracentesis.  No malignant cells on cytology.  States that she just had another xray with Dr. Joylene Draft.  Sleeping better than she was previously  02/06/16 update:  The patient follows up today.  She is on carbidopa/levodopa 25/100, one tablet three times per day.  She is more faithful about tid but the last one is at bedtime, as she states that it helps her relax.   She remains on Azilect, 1 mg daily.  The records  that were made available to me were reviewed.  Dr. Rayann Heman d/c her amiodarone at the end of November.  "I feel much better off of that medication."   2 falls by tripping on shoes.  Didn't get hurt.   Pt denies lightheadedness, near syncope.  No hallucinations.  Mood has been good.  Exercising some with PT exercises.    06/04/16 update:  Patient seen today in follow-up.  She is on carbidopa/levodopa 25/100, one tablet 3 times per day, but often times the last one is at bedtime.  She reports more cramping of the legs in the middle of the night.  She remains on Azilect, 1 mg daily.  She denies any falls.  She denies lightheadedness or near syncope.  No hallucinations.  Referrals were sent for the patient to attend Parkinson's therapy rehabilitation in February, but it looks like she was called and she did not call back to schedule.  10/09/16 update:  Patient seen today in follow-up for Parkinson's disease.  She is on carbidopa/levodopa 25/100, one tablet 3 times per day.  Last visit, I added carbidopa/levodopa 50/200 at night.  She states that it didn't initially make a difference but her cramping is much better now.  She admits that she is taking medication more regularly.  She is still on Azilect.  No falls.  She has been better lately about exercise.  Her husband just had CABG 3 weeks ago.  Takes klonopin but not faithfully.    02/19/17 update: Patient is seen today in follow-up for  Parkinson's disease.  Patient is on carbidopa/levodopa, 1 tablet 3 times per day and carbidopa/levodopa 50/200 at night.  States that sometimes the pharmacy will mess those 2 RXs up.  She is also on Azilect.  Pt denies falls.  Pt denies lightheadedness, near syncope.  No hallucinations.  Mood has been good.  Rarely will take klonopin.  Thinks it works better if she takes it prn.  The records that were made available to me were reviewed.  She had a knee replacement on the L in October.  She recovered really well but it is still a little  sore.   [place] to [place] in [place].  She did get a recumbant bike for xmas.    06/23/17 update: Patient is seen today in follow-up for Parkinson's disease.  Patient is on carbidopa/levodopa 25/100, 1 tablet 3 times per day and carbidopa/levodopa 50/200 at bedtime.  She is also on Azilect.  Pt denies falls.  Pt denies lightheadedness, near syncope.  No hallucinations.  Mood has been good.  She is exercising.  She is having intermittent trouble sleeping.  She states it may be due to days she doesn't exercise, she may have more trouble.  Records have been reviewed since our last visit.  She has seen cardiology.  No changes were made to her medication regimen.  States that she had stomach pain and had "45,000 tests" and it was all neg.  She wondered if it is IBS as grease made worse.    PREVIOUS MEDICATIONS: Azilect and artane  ALLERGIES:   Allergies  Allergen Reactions  . Statins     Leg weakness - but tolerating low dose Crestor every other day    CURRENT MEDICATIONS:  Current Outpatient Medications on File Prior to Visit  Medication Sig Dispense Refill  . CALCIUM PO Take 1 tablet by mouth at bedtime.     . Calcium Polycarbophil (FIBERCON PO) Take 2 tablets by mouth every morning.     . carbidopa-levodopa (SINEMET CR) 50-200 MG tablet TAKE 1 TABLET BY MOUTH EVERYDAY AT BEDTIME 90 tablet 1  . carbidopa-levodopa (SINEMET IR) 25-100 MG tablet TAKE 1 TABLET BY MOUTH 3 (THREE) TIMES DAILY. 270 tablet 1  . clonazePAM (KLONOPIN) 0.5 MG tablet Take 1 tablet (0.5 mg total) by mouth at bedtime. 90 tablet 1  . Coenzyme Q10 (CO Q 10 PO) Take 1 tablet by mouth every morning.     Mariane Baumgarten Sodium (COLACE PO) Take 1 capsule by mouth 2 (two) times daily as needed (constipation).     [name] ELIQUIS 5 MG TABS tablet Take 5 mg by mouth 2 (two) times daily.     [name] ezetimibe (ZETIA) 10 MG tablet Take 10 mg by mouth every other day. Taking the same day as the crestor    . furosemide (LASIX) 40 MG  tablet TAKE 1 TABLET TWICE A DAY 180 tablet 3  . levothyroxine (SYNTHROID, LEVOTHROID) 100 MCG tablet Take 50-100 mcg by mouth See admin instructions. Take 100 mg by mouth on Tuesday, Thursday, Saturday and Sunday. Take 50 mg by mouth on Monday, Wednesday and Friday  2  . MAGNESIUM PO Take 1 tablet by mouth at bedtime.     . metoprolol succinate (TOPROL-XL) 25 MG 24 hr tablet Take 1 tablet (25 mg total) by mouth daily. 90 tablet 3  . multivitamin-lutein (OCUVITE-LUTEIN) CAPS capsule Take 1 capsule by mouth daily.    . polyethylene glycol (MIRALAX / GLYCOLAX) packet Take 17 g by mouth  2 (two) times daily. (Patient taking differently: Take 17 g by mouth daily as needed. ) 14 each 0  . potassium chloride (K-DUR,KLOR-CON) 10 MEQ tablet Take 10 mEq by mouth every morning.    . raloxifene (EVISTA) 60 MG tablet Take 60 mg by mouth daily.  3  . rasagiline (AZILECT) 1 MG TABS tablet TAKE 1 TABLET (1 MG TOTAL) BY MOUTH DAILY. 90 tablet 1  . rosuvastatin (CRESTOR) 5 MG tablet Take 2.5 mg by mouth every other day.    [name] VITAMIN D, ERGOCALCIFEROL, PO Take 1,000 Units by mouth daily.     . valACYclovir (VALTREX) 1000 MG tablet Take 1,000 mg by mouth 2 (two) times daily as needed (fever blisters).     No current facility-administered medications on file prior to visit.     PAST MEDICAL HISTORY:   Past Medical History:  Diagnosis Date  . Anemia   . Arthritis    thumbs  . Atrial tachycardia ([place])   . Atypical atrial flutter ([place])   . Cataract    bilateral  . Chronic anticoagulation    on Xarelto  . Chronic diastolic CHF (congestive heart failure) (HCC)    Echocardiogram (11/24/12): Mild LVH, EF 60%.  . Dysrhythmia   . GERD (gastroesophageal reflux disease)    hx of no meds  . Heart murmur    slight  . Hyperlipidemia    a. Hx of leg weakness while on statin. under control  . Hypertension   . Hypothyroidism   . Leg fracture Dec 21,2011  . Osteoporosis 07/2007  . Parkinson's disease ([place]) 2011  .  Persistent atrial fibrillation (HCC)    s/p atrial fib ablation 11-11-11.   [name] PONV (postoperative nausea and vomiting)   . Presence of permanent cardiac pacemaker    permanent Medtronic  . Shortness of breath    "when walking at incline or stairs"  . Sinus brady-tachy syndrome ([place])   . Squamous carcinoma summer 2015   back of left thigh  . Wrist fracture    right    PAST SURGICAL HISTORY:   Past Surgical History:  Procedure Laterality Date  . ATRIAL FIBRILLATION ABLATION N/A 11/11/2011   Procedure: ATRIAL FIBRILLATION ABLATION;  Surgeon: Thompson Grayer, MD;  Location: [facility] CATH LAB;  Service: Cardiovascular;  Laterality: N/A;  . BREAST BIOPSY  1998  . BREAST SURGERY     benign cyst removed  . CARDIAC ELECTROPHYSIOLOGY STUDY AND ABLATION  5/14, 7/14   convergent AF ablation at [facility] and subsequent atypical atrial flutter ablation by Dr Lehman Prom  . CARDIOVASCULAR STRESS TEST  06-06-2008   EF 73%  . CARDIOVERSION  09/09/2011   Procedure: CARDIOVERSION;  Surgeon: Darlin Coco, MD;  Location[facility] ENDOSCOPY;  Service: Cardiovascular;  Laterality: N/A;  to be done by dr. Mare Ferrari  . CARDIOVERSION  02/18/2012   Procedure: CARDIOVERSION;  Surgeon: Thayer Headings, MD;  Location: [facility];  Service: Cardiovascular;  Laterality: N/A;  . CHOLECYSTECTOMY  1995  . FEMUR FRACTURE SURGERY  2011   metal pin in place  . FOREARM SURGERY Left 2010   "opened arm to drain it"  . INSERTION OF MESH N/A 11/24/2013   Procedure: INSERTION OF MESH;  Surgeon: [name] Boston, MD;  Location: [facility] ORS;  Service: General;  Laterality: N/A;  . KNEE SURGERY Left 2001  . Left unicompartmental knee     Dr. Alvan Dame 11/18/16  . PACEMAKER PLACEMENT  10/20/12   MDT Advisa DR implanted by Dr Lehman Prom at [facility]   .  PARTIAL KNEE ARTHROPLASTY Left 11/18/2016   Procedure: LEFT UNICOMPARTMENTAL KNEE Medially;  Surgeon: Paralee Cancel, MD;  Location: [facility] ORS;  Service: Orthopedics;  Laterality: Left;  90 mins  . SKIN CANCER DESTRUCTION   summer 2015  . TEE WITHOUT CARDIOVERSION  11/10/2011   Procedure: TRANSESOPHAGEAL ECHOCARDIOGRAM (TEE);  Surgeon: Thayer Headings, MD;  Location: [facility];  Service: Cardiovascular;  Laterality: N/A;  . TONSILLECTOMY  as child  . US ECHOCARDIOGRAPHY  03-10-2008   Est EF 55-60%, Dr. Cathie Olden  . VENTRAL HERNIA REPAIR N/A 11/24/2013   Procedure: LAPAROSCOPIC VENTRAL HERNIA;  Surgeon: [name] Boston, MD;  Location: [facility];  Service: General;  Laterality: N/A;  . WRIST SURGERY Right ~2009   metal rod in place    SOCIAL HISTORY:   Social History   Socioeconomic History  . Marital status: Married    Spouse name: Not on file  . Number of children: Not on file  . Years of education: Not on file  . Highest education level: Not on file  Occupational History  . Not on file  Social Needs  . Financial resource strain: Not on file  . Food insecurity:    Worry: Not on file    Inability: Not on file  . Transportation needs:    Medical: Not on file    Non-medical: Not on file  Tobacco Use  . Smoking status: Never Smoker  . Smokeless tobacco: Never Used  Substance and Sexual Activity  . Alcohol use: No    Comment: 1-2 glasses of wine daily  . Drug use: No  . Sexual activity: Yes    Partners: Male    Birth control/protection: Post-menopausal  Lifestyle  . Physical activity:    Days per week: Not on file    Minutes per session: Not on file  . Stress: Not on file  Relationships  . Social connections:    Talks on phone: Not on file    Gets together: Not on file    Attends religious service: Not on file    Active member of club or organization: Not on file    Attends meetings of clubs or organizations: Not on file    Relationship status: Not on file  . Intimate partner violence:    Fear of current or ex partner: Not on file    Emotionally abused: Not on file    Physically abused: Not on file    Forced sexual activity: Not on file  Other Topics Concern  . Not on file  Social History  Narrative   Lives in [place].  Retired Licensed conveyancer.    FAMILY HISTORY:   Family Status  Relation Name Status  . Mother  Deceased at age 71       AD   . Father  Deceased at age 13       Valve-rheumatic fever as child  . Child  Alive       healthy  . Child  Alive       healthy  . Mat Aunt  (Not Specified)    ROS:  A complete 10 system review of systems was obtained and was unremarkable apart from what is mentioned above.  PHYSICAL EXAMINATION:    VITALS:   Vitals:   06/23/17 1435  BP: 106/62  Pulse: 94  SpO2: 96%  Weight: 148 lb (67.1 kg)  Height: 5\' 2"  (1.575 m)    GEN:  The patient appears stated age and is in NAD. HEENT:  Normocephalic, atraumatic.  The mucous membranes are moist. The superficial temporal arteries are without ropiness or tenderness. CV:  Irreg Lungs:  CTAB Neck/HEME:  There are no carotid bruits bilaterally.  Neurological examination:  Orientation: The patient is alert and oriented x3. Cranial nerves: There is good facial symmetry. The speech is fluent and clear. Soft palate rises symmetrically and there is no tongue deviation. Hearing is intact to conversational tone. Sensation: Sensation is intact to light touch throughout Motor: Strength is 5/5 in the bilateral upper and lower extremities.   Shoulder shrug is equal and symmetric.  There is no pronator drift.  Movement examination: Tone: There is normal tone  Abnormal movements:   There is intermittent tremor of the RLE.  She has RUE tremor with ambulation. Coordination:  There is mild decremation with finger taps and alternation of supination/pronation on the L Gait and Station: The patient has no difficulty arising out of a deep-seated chair without the use of the hands. The patient's stride length is good.  She has re-emergent tremor on the right.  She has mild pisa syndrome  ASSESSMENT/PLAN:  1.  idiopathic tremor predominant Parkinson's disease  -continue carbidopa/levodopa 25/100 tid.     -continue carbidopa/levodopa 50/200 at bed.  -She will remain on the Azilect.  -cardiology d/c amiodarone in 12/2015  -she is going to [place] in July and [place] in June.   2.  Insomnia.  -takes klonopin prn.  I refilled this today  -told her to add melatonin, 3 mg for intermittent insomnia.   3.  F/u 4-5 months

## 2017-06-23 ENCOUNTER — Ambulatory Visit (INDEPENDENT_AMBULATORY_CARE_PROVIDER_SITE_OTHER): Payer: Medicare Other | Admitting: Neurology

## 2017-06-23 ENCOUNTER — Encounter: Payer: Self-pay | Admitting: Neurology

## 2017-06-23 VITALS — BP 106/62 | HR 94 | Ht 62.0 in | Wt 148.0 lb

## 2017-06-23 DIAGNOSIS — D62 Acute posthemorrhagic anemia: Secondary | ICD-10-CM | POA: Diagnosis not present

## 2017-06-23 DIAGNOSIS — E038 Other specified hypothyroidism: Secondary | ICD-10-CM | POA: Diagnosis not present

## 2017-06-23 DIAGNOSIS — G4700 Insomnia, unspecified: Secondary | ICD-10-CM | POA: Diagnosis not present

## 2017-06-23 DIAGNOSIS — G20 Parkinson's disease: Secondary | ICD-10-CM

## 2017-06-23 MED ORDER — CLONAZEPAM 0.5 MG PO TABS: 0.5000 mg | ORAL_TABLET | Freq: Every day | ORAL | 1 refills | Status: DC

## 2017-06-23 NOTE — Patient Instructions (Signed)
  [facility] [employer] & Movement Disorders  The [facility] Movement Disorders team know that living well with a movement disorder extends far beyond our clinic walls. We are together with you. Our team is passionate about providing resources to you and your loved ones who are living with Parkinson's disease and movement disorders. Participate in these programs and join our community. These resources are free or low cost!   [facility] Movement [facility] is adding:   Innovative educational programs for patients and caregivers.   Support groups for patients and caregivers living with Parkinson's disease.   Parkinson's specific exercise programs.   Custom tailored therapeutic programs that will benefit patient's living with Parkinson's disease.   We are in this together. You can help and contribute to grow these programs and resources in our community. 100% of the funds donated to the [facility] stays right here in our community to support patients and their caregivers.  To make a tax deductible contribution:  -ask for a [facility] for Parkinson's envelope in the office today.  - call the Office of Institutional Advancement at [phone].     Registration is OPEN!    Third Annual Parkinson's Education Symposium   To register: [email or web address]      Search:  [employer] person attending individually Questions: [place], [place] or Janett [email or web address]    [facility] for Adults with Parkinson's Thursdays 9:30-10:30 am May 2, May 9, & May 16 & Tuesdays 10:00-11:00 am June 4, June 18, & July 2 Elnora Morrison [employer], [address] [place]. To register: [phone] or email [email or web address]

## 2017-07-01 ENCOUNTER — Ambulatory Visit (INDEPENDENT_AMBULATORY_CARE_PROVIDER_SITE_OTHER): Payer: Medicare Other | Admitting: *Deleted

## 2017-07-01 ENCOUNTER — Telehealth: Payer: Self-pay

## 2017-07-01 DIAGNOSIS — I495 Sick sinus syndrome: Secondary | ICD-10-CM | POA: Diagnosis not present

## 2017-07-01 NOTE — Telephone Encounter (Signed)
LMOVM reminding pt to send remote transmission.   

## 2017-07-03 LAB — CUP PACEART REMOTE DEVICE CHECK
Battery Remaining Longevity: 33 mo
Battery Voltage: 2.96 V
Brady Statistic AP VP Percent: 1.01 %
Brady Statistic AP VS Percent: 0.13 %
Brady Statistic AS VS Percent: 66.09 %
Brady Statistic RV Percent Paced: 33.71 %
Date Time Interrogation Session: 20190529201729
Implantable Lead Implant Date: 20140917
Implantable Lead Implant Date: 20140917
Implantable Lead Location: 753859
Implantable Pulse Generator Implant Date: 20140917
Lead Channel Impedance Value: 342 Ohm
Lead Channel Impedance Value: 361 Ohm
Lead Channel Impedance Value: 437 Ohm
Lead Channel Pacing Threshold Amplitude: 0.625 V
Lead Channel Pacing Threshold Amplitude: 1 V
Lead Channel Sensing Intrinsic Amplitude: 0.375 mV
Lead Channel Sensing Intrinsic Amplitude: 14.75 mV
Lead Channel Setting Pacing Amplitude: 2 V
Lead Channel Setting Pacing Amplitude: 2.5 V
Lead Channel Setting Pacing Pulse Width: 0.4 ms
Lead Channel Setting Sensing Sensitivity: 0.9 mV
MDC IDC LEAD LOCATION: 753860
MDC IDC MSMT LEADCHNL RA IMPEDANCE VALUE: 323 Ohm
MDC IDC MSMT LEADCHNL RA PACING THRESHOLD PULSEWIDTH: 0.4 ms
MDC IDC MSMT LEADCHNL RA SENSING INTR AMPL: 0.375 mV
MDC IDC MSMT LEADCHNL RV PACING THRESHOLD PULSEWIDTH: 0.4 ms
MDC IDC MSMT LEADCHNL RV SENSING INTR AMPL: 14.75 mV
MDC IDC STAT BRADY AS VP PERCENT: 32.77 %
MDC IDC STAT BRADY RA PERCENT PACED: 0.93 %

## 2017-07-03 NOTE — Progress Notes (Signed)
Remote pacemaker transmission.   

## 2017-07-10 ENCOUNTER — Other Ambulatory Visit: Payer: Self-pay | Admitting: Internal Medicine

## 2017-07-15 ENCOUNTER — Other Ambulatory Visit (HOSPITAL_COMMUNITY): Payer: Self-pay | Admitting: *Deleted

## 2017-07-16 ENCOUNTER — Ambulatory Visit (HOSPITAL_COMMUNITY)
Admission: RE | Admit: 2017-07-16 | Discharge: 2017-07-16 | Disposition: A | Discharge status: Home / Self Care | Payer: Medicare Other | Source: Ambulatory Visit | Attending: Internal Medicine | Admitting: Internal Medicine

## 2017-07-16 DIAGNOSIS — D649 Anemia, unspecified: Secondary | ICD-10-CM | POA: Diagnosis not present

## 2017-07-16 MED ORDER — SODIUM CHLORIDE 0.9 % IV SOLN
510.0000 mg | INTRAVENOUS | Status: DC
  Administered 2017-07-16: 510 mg via INTRAVENOUS
  Filled 2017-07-16: qty 17

## 2017-07-23 ENCOUNTER — Ambulatory Visit (HOSPITAL_COMMUNITY)
Admission: RE | Admit: 2017-07-23 | Discharge: 2017-07-23 | Disposition: A | Discharge status: Home / Self Care | Payer: Medicare Other | Source: Ambulatory Visit | Attending: Internal Medicine | Admitting: Internal Medicine

## 2017-07-23 DIAGNOSIS — D649 Anemia, unspecified: Secondary | ICD-10-CM | POA: Diagnosis not present

## 2017-07-23 MED ORDER — FERUMOXYTOL INJECTION 510 MG/17 ML
510.0000 mg | INTRAVENOUS | Status: DC
  Administered 2017-07-23: 510 mg via INTRAVENOUS
  Filled 2017-07-23: qty 17

## 2017-08-31 DIAGNOSIS — D62 Acute posthemorrhagic anemia: Secondary | ICD-10-CM | POA: Diagnosis not present

## 2017-08-31 DIAGNOSIS — I509 Heart failure, unspecified: Secondary | ICD-10-CM | POA: Diagnosis not present

## 2017-08-31 DIAGNOSIS — Z950 Presence of cardiac pacemaker: Secondary | ICD-10-CM | POA: Diagnosis not present

## 2017-08-31 DIAGNOSIS — M810 Age-related osteoporosis without current pathological fracture: Secondary | ICD-10-CM | POA: Diagnosis not present

## 2017-08-31 DIAGNOSIS — G20 Parkinson's disease: Secondary | ICD-10-CM | POA: Diagnosis not present

## 2017-08-31 DIAGNOSIS — E038 Other specified hypothyroidism: Secondary | ICD-10-CM | POA: Diagnosis not present

## 2017-08-31 DIAGNOSIS — I480 Paroxysmal atrial fibrillation: Secondary | ICD-10-CM | POA: Diagnosis not present

## 2017-08-31 DIAGNOSIS — I10 Essential (primary) hypertension: Secondary | ICD-10-CM | POA: Diagnosis not present

## 2017-08-31 DIAGNOSIS — D649 Anemia, unspecified: Secondary | ICD-10-CM | POA: Diagnosis not present

## 2017-08-31 DIAGNOSIS — Z6827 Body mass index (BMI) 27.0-27.9, adult: Secondary | ICD-10-CM | POA: Diagnosis not present

## 2017-09-07 DIAGNOSIS — R110 Nausea: Secondary | ICD-10-CM | POA: Diagnosis not present

## 2017-09-07 DIAGNOSIS — I10 Essential (primary) hypertension: Secondary | ICD-10-CM | POA: Diagnosis not present

## 2017-09-07 DIAGNOSIS — I4891 Unspecified atrial fibrillation: Secondary | ICD-10-CM | POA: Diagnosis not present

## 2017-09-07 DIAGNOSIS — R42 Dizziness and giddiness: Secondary | ICD-10-CM | POA: Diagnosis not present

## 2017-09-07 DIAGNOSIS — Z7982 Long term (current) use of aspirin: Secondary | ICD-10-CM | POA: Diagnosis not present

## 2017-09-07 DIAGNOSIS — R079 Chest pain, unspecified: Secondary | ICD-10-CM | POA: Diagnosis not present

## 2017-09-30 ENCOUNTER — Ambulatory Visit (INDEPENDENT_AMBULATORY_CARE_PROVIDER_SITE_OTHER): Payer: Medicare Other | Admitting: *Deleted

## 2017-09-30 ENCOUNTER — Telehealth: Payer: Self-pay

## 2017-09-30 DIAGNOSIS — I495 Sick sinus syndrome: Secondary | ICD-10-CM

## 2017-09-30 NOTE — Telephone Encounter (Signed)
LMOVM reminding pt to send remote transmission.   

## 2017-10-01 NOTE — Progress Notes (Signed)
Remote pacemaker transmission.   

## 2017-10-07 DIAGNOSIS — R195 Other fecal abnormalities: Secondary | ICD-10-CM | POA: Diagnosis not present

## 2017-10-07 DIAGNOSIS — K760 Fatty (change of) liver, not elsewhere classified: Secondary | ICD-10-CM | POA: Diagnosis not present

## 2017-10-07 DIAGNOSIS — R140 Abdominal distension (gaseous): Secondary | ICD-10-CM | POA: Diagnosis not present

## 2017-10-07 DIAGNOSIS — D500 Iron deficiency anemia secondary to blood loss (chronic): Secondary | ICD-10-CM | POA: Diagnosis not present

## 2017-10-08 ENCOUNTER — Other Ambulatory Visit: Payer: Self-pay | Admitting: Gastroenterology

## 2017-10-08 DIAGNOSIS — K7460 Unspecified cirrhosis of liver: Secondary | ICD-10-CM

## 2017-10-14 DIAGNOSIS — Z23 Encounter for immunization: Secondary | ICD-10-CM | POA: Diagnosis not present

## 2017-10-19 ENCOUNTER — Ambulatory Visit
Admission: RE | Admit: 2017-10-19 | Discharge: 2017-10-19 | Disposition: A | Discharge status: Home / Self Care | Payer: Medicare Other | Source: Ambulatory Visit | Attending: Gastroenterology | Admitting: Gastroenterology

## 2017-10-19 DIAGNOSIS — K7460 Unspecified cirrhosis of liver: Secondary | ICD-10-CM

## 2017-10-19 IMAGING — US US ABDOMEN COMPLETE
1 series · 14 of 25 positions shown · non-contrast
Comparison: Ultrasound [DATE].  CT [DATE].  MRI [DATE].

CLINICAL DATA: Cirrhosis, HCC screening.

EXAM:
ABDOMEN ULTRASOUND COMPLETE

[Series 1: us abdomen complete · 0.20mm/px · 14 of 92 slices shown]
[im 1/92]
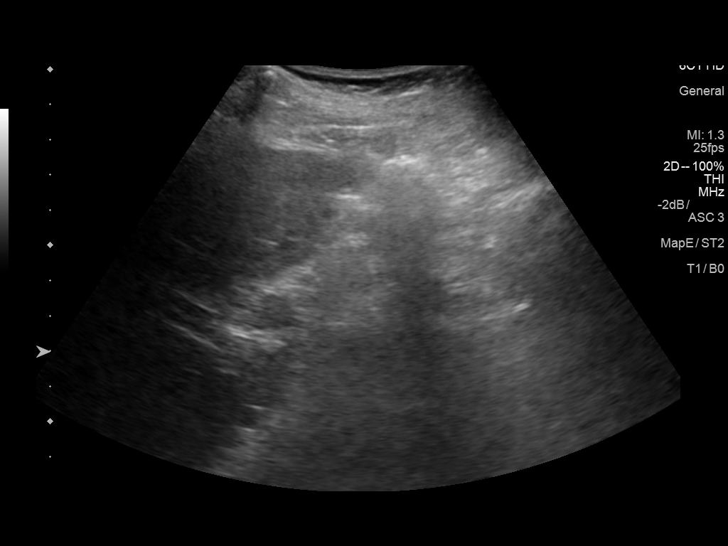
[im 8/92]
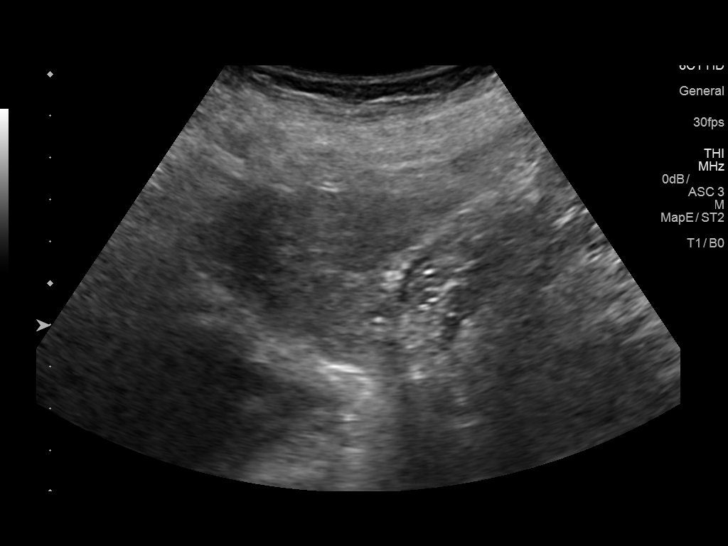
[im 16/92]
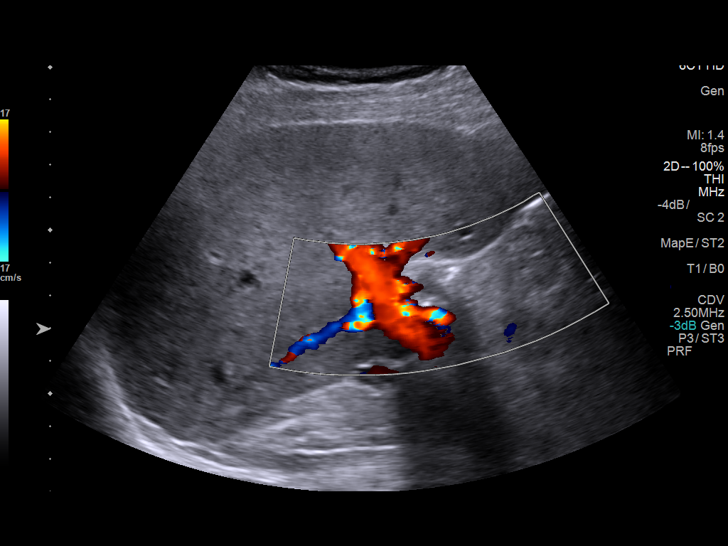
[im 23/92]
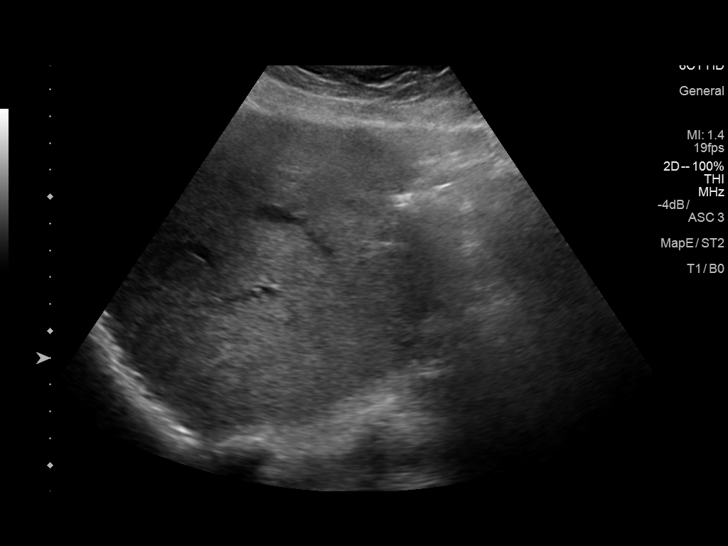
[im 31/92]
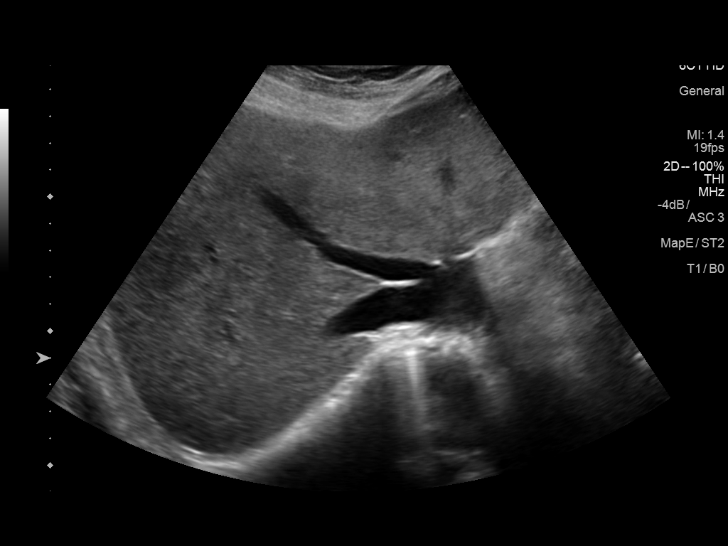
[im 35/92]
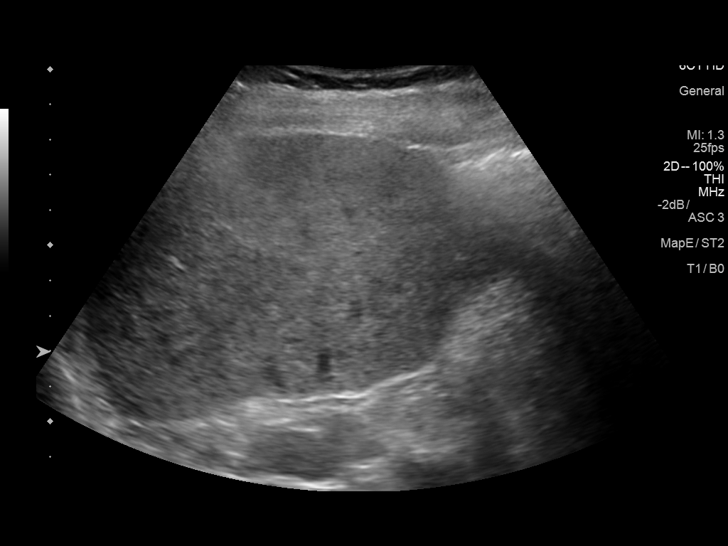
[im 42/92]
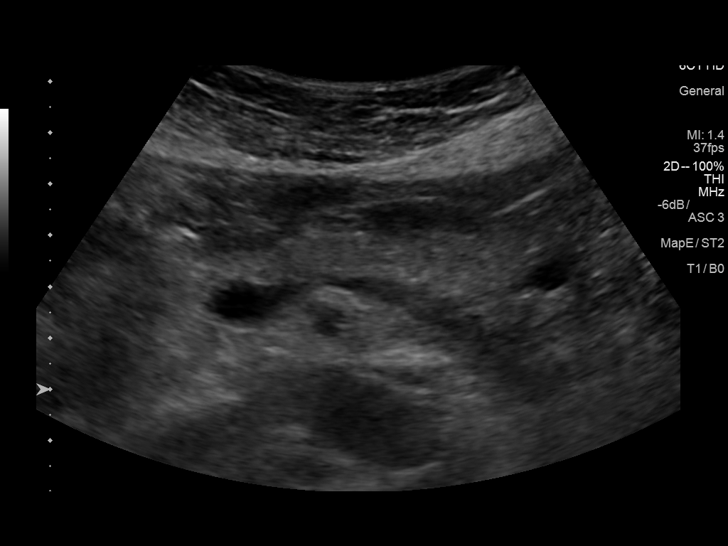
[im 50/92]
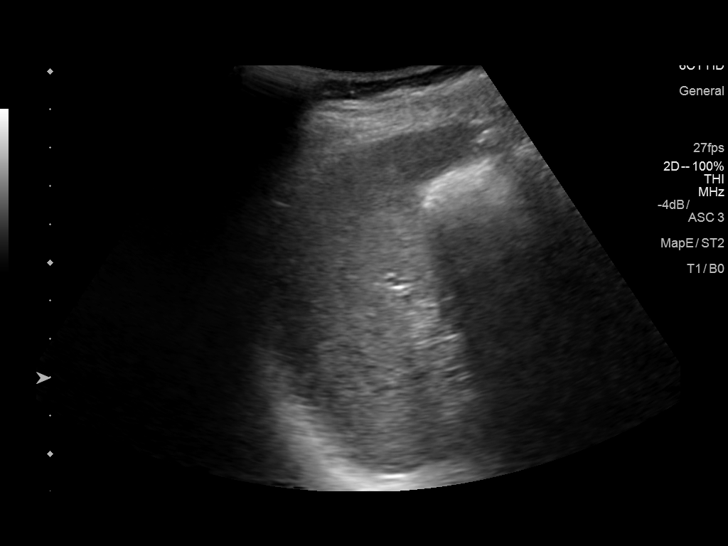
[im 57/92]
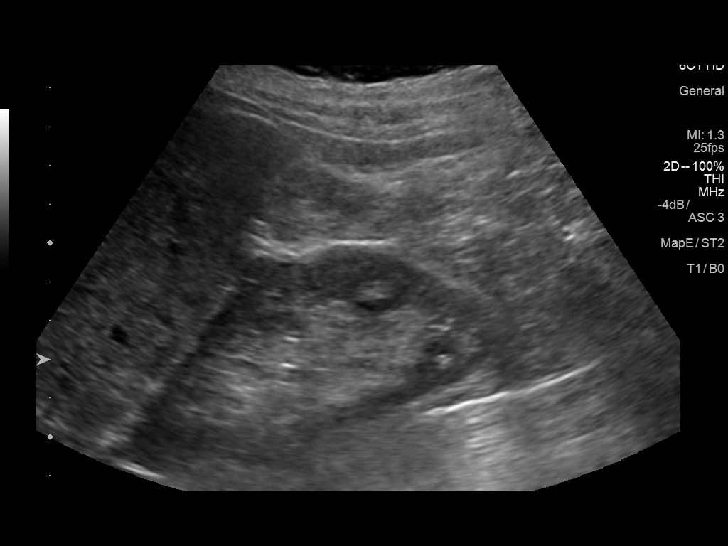
[im 61/92]
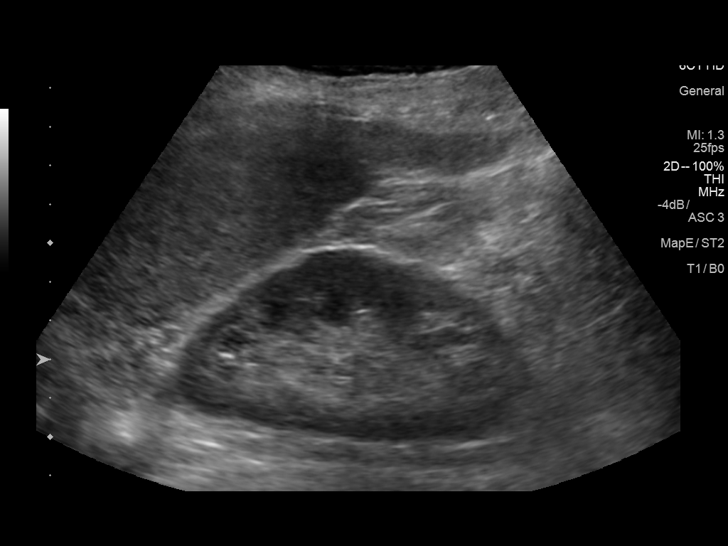
[im 69/92]
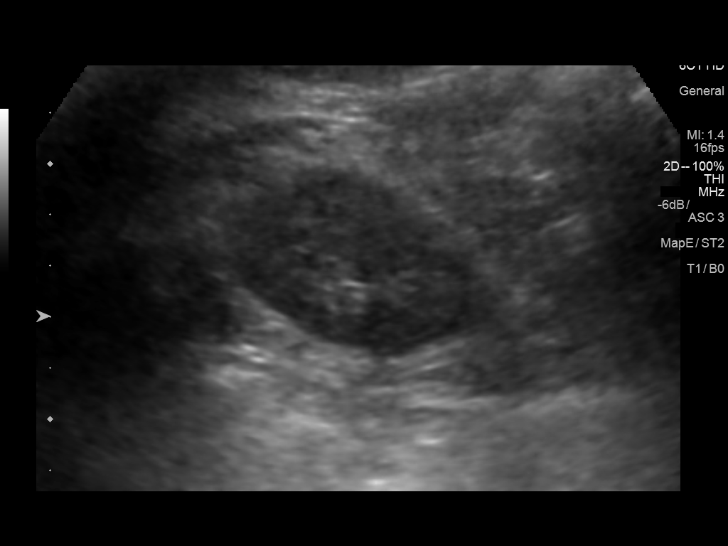
[im 76/92]
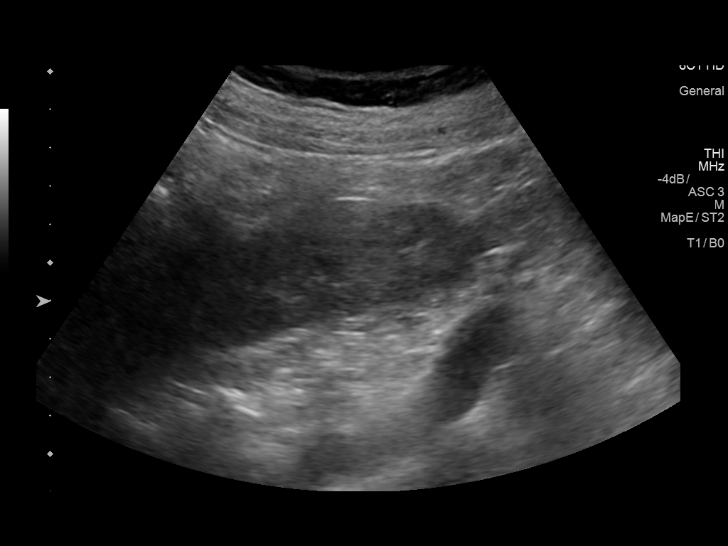
[im 84/92]
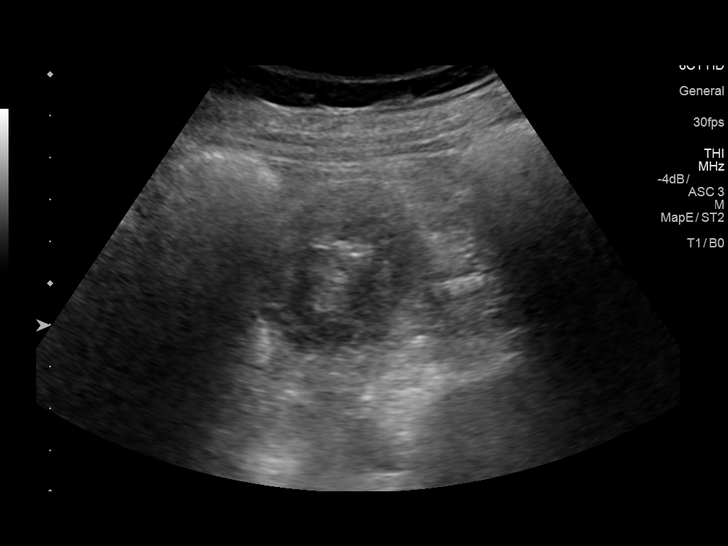
[im 92/92]
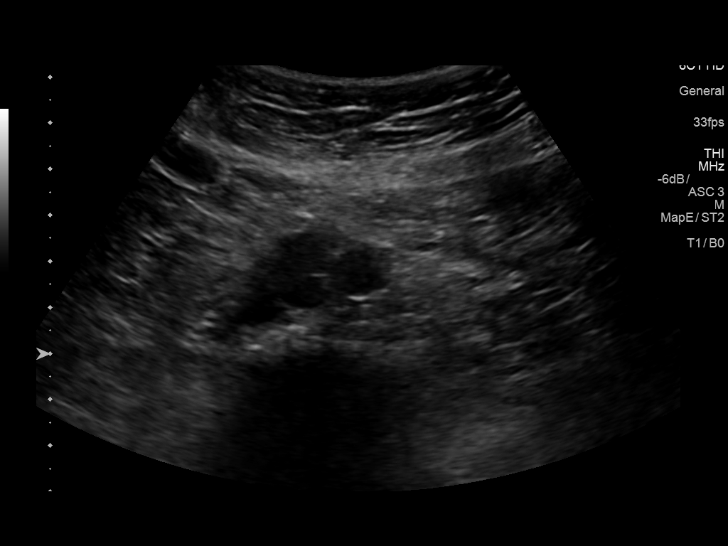

[14 of 25 positions shown; findings below may reference images not displayed]

FINDINGS: Gallbladder: Prior cholecystectomy

Common bile duct: Diameter: Normal caliber, 6 mm

Liver: Heterogeneous echotexture. No biliary duct dilatation or
visible focal lesion. The previously seen high left lobe lesion not
visualized on today's ultrasound. Portal vein is patent on color
Doppler imaging with normal direction of blood flow towards the
liver.

IVC: No abnormality visualized.

Pancreas: Visualized portion unremarkable.

Spleen: Size and appearance within normal limits.

Right Kidney: Length: 9.7 cm. Echogenicity within normal limits. No
mass or hydronephrosis visualized.

Left Kidney: Length: 10.6 cm. Echogenicity within normal limits. No
mass or hydronephrosis visualized.

Abdominal aorta: No aneurysm visualized.

Other findings: None.
IMPRESSION: Heterogeneous echotexture throughout the liver. The previously seen
high anterior left hepatic lobe lesion by CT and MRI not appreciable
by ultrasound. This would be better followed with CT or MRI if felt
clinically indicated.

Prior cholecystectomy.

## 2017-10-22 LAB — CUP PACEART REMOTE DEVICE CHECK
Battery Voltage: 2.96 V
Brady Statistic AP VP Percent: 5.77 %
Brady Statistic AP VS Percent: 0.33 %
Brady Statistic AS VP Percent: 35.88 %
Brady Statistic AS VS Percent: 58.02 %
Implantable Lead Implant Date: 20140917
Implantable Lead Location: 753859
Implantable Lead Location: 753860
Lead Channel Impedance Value: 342 Ohm
Lead Channel Impedance Value: 361 Ohm
Lead Channel Impedance Value: 380 Ohm
Lead Channel Pacing Threshold Amplitude: 0.625 V
Lead Channel Pacing Threshold Amplitude: 0.875 V
Lead Channel Pacing Threshold Pulse Width: 0.4 ms
Lead Channel Sensing Intrinsic Amplitude: 14.375 mV
Lead Channel Sensing Intrinsic Amplitude: 14.375 mV
Lead Channel Setting Pacing Amplitude: 2 V
Lead Channel Setting Pacing Amplitude: 2.5 V
Lead Channel Setting Pacing Pulse Width: 0.4 ms
Lead Channel Setting Sensing Sensitivity: 0.9 mV
MDC IDC LEAD IMPLANT DT: 20140917
MDC IDC MSMT BATTERY REMAINING LONGEVITY: 29 mo
MDC IDC MSMT LEADCHNL RA PACING THRESHOLD PULSEWIDTH: 0.4 ms
MDC IDC MSMT LEADCHNL RA SENSING INTR AMPL: 0.375 mV
MDC IDC MSMT LEADCHNL RA SENSING INTR AMPL: 0.375 mV
MDC IDC MSMT LEADCHNL RV IMPEDANCE VALUE: 456 Ohm
MDC IDC PG IMPLANT DT: 20140917
MDC IDC SESS DTM: 20190828163936
MDC IDC STAT BRADY RA PERCENT PACED: 4.95 %
MDC IDC STAT BRADY RV PERCENT PACED: 38.91 %

## 2017-10-25 ENCOUNTER — Other Ambulatory Visit: Payer: Self-pay | Admitting: Neurology

## 2017-10-30 DIAGNOSIS — L570 Actinic keratosis: Secondary | ICD-10-CM | POA: Diagnosis not present

## 2017-10-30 DIAGNOSIS — L821 Other seborrheic keratosis: Secondary | ICD-10-CM | POA: Diagnosis not present

## 2017-10-30 DIAGNOSIS — Z85828 Personal history of other malignant neoplasm of skin: Secondary | ICD-10-CM | POA: Diagnosis not present

## 2017-11-03 NOTE — Progress Notes (Signed)
[name] [name] was seen today in the movement disorders clinic for neurologic consultation at the request of Crist Infante, MD.  The consultation is for the evaluation of PD.  She has previously seen [name] and [name].  I have some of [name] notes.  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  She had a normal MRI brain at that time according to notes from Dr. love.  In 2011, her diagnosis was changed to Parkinson's disease and Azilect was added.  She had a second opinion at [facility] on 07/25/2009 and [name] also thought that she had Parkinson's disease.  In January, 2014 [name] added Artane, presumably for tremor.  She does think that it has helped the tremor.  02/22/13 update:  The pt is f/u today re: PD.  After our discussion last visit, she decided to stop the artane and states that while she noted a little increase in tremor, it hasn't been bad.  I had wanted to start her on carbidopa/levodopa 25/100 but she wanted to hold on that last vist.  Balance is good but she has felt more slow and stiff.  No falls.  No hallucinations.  No n/v.  No near syncope.  Not exercising faithfully.  06/20/13 update:  The pt is f/u today re: PD.  She was started on carbidopa/levodopa 25/100 last visit in January in addition to the azilect that she was already on.  She just returned from a trip from the [place] and was so surprised that she could do all of the planned excursions.  She felt great that she could do all the hiking and could get in and out of the zodiac boat.  She states that she is so glad she started taking the levodopa and thinks that her mood is better.  Her coordination and tremor are better but she still has some tremor.  Some minor stomach upset.  Pharmacist told her to take it with food and she admits that she is taking it with protein.  She last took levodopa at 7:30 am and was examined at 10am.  She is having difficulty staying asleep.  10/13/13 update:  Pt is on carbidopa/levodopa 25/100  and is supposed to be on one tid but admits that she is only taking 1/2 po bid. Did this because of GI upset.   She is having more tremor especially when walking in the L hand; she is not sure if it is any particular time of day.  It doesn't bother her too much.  Some increased difficulty buttoning clothing.  Has a URI and is leaving for [place] on Friday.  She is going for 3 weeks.    02/13/14 update:  The patient returns today for follow-up.  She reports that she is not moving as fluidly as she previously was.  Last time I told her that she needed to really increase her medication to one tablet 3 times per day but she did not do that and she is only taking one twice a day; interestingly she gets the first and the middle of the day dosage in and forgets the last.  She does get nighttime (leg/toe) cramping.  She did have a ventral hernia repair on 11/24/2013.  States that she had a difficult time recovering as she had a hematoma after.  Could not walk after surgery and has not been back to exercise but knows that she needs to.  No falls.  Little tremor.    06/07/14 update:  The patient is f/u for PD.  She is still on carbidopa/levodopa 25/100 twice per day.  She did better clinically on tid dosing but backed down on it to bid on her own.   She states that "sometimes" she takes the third dose but she often is now missing the middle of the day dose.  Sometimes, she only takes it once a day.   She is walking well; no falls.  She is exercising but states that she just had a cortisone injection into the left knee so that was a little limited.  She is having an ablation for a-fib next tues.  She states that it will be done at [facility] and it is a 3 hr procedure (because it is internal not external).  No lightheadedness.  She is still on azilect faithfully.  She takes klonopin about 1-2 times per week for insomnia.    10/11/14 update:  The patient follows up today for Parkinson's disease.  Last visit, I encouraged her  to go back up on her carbidopa/levodopa from twice per day to 3 times per day as she was more stiff and rigid last visit.  She may or may not do that but finds that most days she takes it bid. She has had some toe curling but is unsure if it is related to timing of meds.   She remains on Azilect as well as clonazepam for her insomnia.  She reports that she sleeps with this but she admits she doesn't take it regularly.  After some discussion today, we realized that she acidentally was taking temazepam instead of the klonopin.    She reports that she had another cardiac ablation since last visit and thought that it originally didn't work but now she thinks that it is.    02/14/15 update:  The patient follows up today.  She is supposed to be on carbidopa/levodopa 25/100, 3 times a day, but she generally takes it twice a day.  She is on Azilect.  Since our last visit, she has started amiodarone.  She denies falls.  She denies hallucinations.  She denies lightheadedness or near syncope.  Her husband thinks that her voice isn't as good.  She has had some SOB and wonders if it isn't from the amiodarone (but thinks that she recently had some altitidue sickness as well skiing in the mountains); she is f/u with Dr. Rayann Heman and is to have a CXR.  She does think that SOB is getting better.  She has not been able to exercise as much because of the SOB.  They have moved to the new villas at [facility] [place].  She is having some trouble sleeping even with klonopin.    06/14/15 update:  The patient is following up today.  I have reviewed records since previous visit.  Last visit, encouraged her to take carbidopa/levodopa 25/100, 3 times per day, as she was often only taking it twice per day, with the second dosage at bedtime.  States that she still has trouble remembering to take it and she often only takes it once a day.   She remains on Azilect.  She is also on clonazepam for insomnia but is not using it faithfully because she  doesn't think that it helps.  Mood has been okay, but admits that it goes up and down depending on how she is doing medically.  Because atrial arrhythmias, she was started on amiodarone in November, 2016.  This was increased on 05/28/2015 to 200 mg twice  a day but it was decreased back to once per day yesterday.  Pt states that she has been so SOB and she was told that it was likely from the amiodarone.  Because of SOB, she hasn't been able to exercise.  She has had 3 falls.  States that with one she was walking and talking to someone behind her and tripped over a curb.  With one, she fell when an elevator door closed on her.  With one fall, her shoe came off and she tripped.  No hallucinations.    11/05/15 update:  The patient follows up today for her Parkinson's disease.  She has been noncompliant with levodopa dosing, and I again encouraged her last visit to take carbidopa/levodopa 25/100, one tablet 3 times per day.  She isn't doing this and is only taking one per day.   She states that she is doing well, however.  She had 2 falls since our last visit but states that during her PT screen she did well and wasn't considered a fall risk.  With the falls, she was outside on an uneven surface and just tripped.  She is on Azilect, 1 mg daily.  Remains on amiodarone (but had dx of PD prior to its use) but dose reduced at end of Aug by Dr. Rayann Heman and if she continues to do well, plans are to d/c that medication.  I reviewed cardiology records.  No falls since last visit.  Had a pleural effusion and subsequent thoracentesis.  No malignant cells on cytology.  States that she just had another xray with Dr. Joylene Draft.  Sleeping better than she was previously  02/06/16 update:  The patient follows up today.  She is on carbidopa/levodopa 25/100, one tablet three times per day.  She is more faithful about tid but the last one is at bedtime, as she states that it helps her relax.   She remains on Azilect, 1 mg daily.  The records  that were made available to me were reviewed.  Dr. Rayann Heman d/c her amiodarone at the end of November.  "I feel much better off of that medication."   2 falls by tripping on shoes.  Didn't get hurt.   Pt denies lightheadedness, near syncope.  No hallucinations.  Mood has been good.  Exercising some with PT exercises.    06/04/16 update:  Patient seen today in follow-up.  She is on carbidopa/levodopa 25/100, one tablet 3 times per day, but often times the last one is at bedtime.  She reports more cramping of the legs in the middle of the night.  She remains on Azilect, 1 mg daily.  She denies any falls.  She denies lightheadedness or near syncope.  No hallucinations.  Referrals were sent for the patient to attend Parkinson's therapy rehabilitation in February, but it looks like she was called and she did not call back to schedule.  10/09/16 update:  Patient seen today in follow-up for Parkinson's disease.  She is on carbidopa/levodopa 25/100, one tablet 3 times per day.  Last visit, I added carbidopa/levodopa 50/200 at night.  She states that it didn't initially make a difference but her cramping is much better now.  She admits that she is taking medication more regularly.  She is still on Azilect.  No falls.  She has been better lately about exercise.  Her husband just had CABG 3 weeks ago.  Takes klonopin but not faithfully.    02/19/17 update: Patient is seen today in follow-up for  Parkinson's disease.  Patient is on carbidopa/levodopa, 1 tablet 3 times per day and carbidopa/levodopa 50/200 at night.  States that sometimes the pharmacy will mess those 2 RXs up.  She is also on Azilect.  Pt denies falls.  Pt denies lightheadedness, near syncope.  No hallucinations.  Mood has been good.  Rarely will take klonopin.  Thinks it works better if she takes it prn.  The records that were made available to me were reviewed.  She had a knee replacement on the L in October.  She recovered really well but it is still a little  sore.   [place] to [place] in [place] for [place].  She did get a recumbant bike for xmas.    06/23/17 update: Patient is seen today in follow-up for Parkinson's disease.  Patient is on carbidopa/levodopa 25/100, 1 tablet 3 times per day and carbidopa/levodopa 50/200 at bedtime.  She is also on Azilect.  Pt denies falls.  Pt denies lightheadedness, near syncope.  No hallucinations.  Mood has been good.  She is exercising.  She is having intermittent trouble sleeping.  She states it may be due to days she doesn't exercise, she may have more trouble.  Records have been reviewed since our last visit.  She has seen cardiology.  No changes were made to her medication regimen.  States that she had stomach pain and had "45,000 tests" and it was all neg.  She wondered if it is IBS as grease made worse.    11/10/17 update: Patient is seen today in follow-up for Parkinson's disease.  Remains on carbidopa/levodopa 25/100, 1 tablet 3 times per day in addition to carbidopa/levodopa 50/200 at bedtime.  She is also on Azilect.  Overall, she has been doing well.  She was in the emergency room at the beginning of August for lightheadedness and I have reviewed those records.  Work-up was fairly unremarkable and symptoms had resolved.  She was discharged home.  Pt said that she woke up that morning and felt nauseated and diaphoretic and she looked up the symptoms and it said could be cardiac and that is why went to ER.  Sx's only lasted 30 min.   One fall since our last visit.  got her arm caught in the door and lost balance and fell.  Did not get hurt.  No hallucinations.  She did see dermatology last week and had some places frozen off.  Travelled to [place] since last visit and did really well.[name]    PREVIOUS MEDICATIONS: Azilect and artane  ALLERGIES:   Allergies  Allergen Reactions  . Statins     Leg weakness - but tolerating low dose Crestor every other day    CURRENT MEDICATIONS:  Current  Outpatient Medications on File Prior to Visit  Medication Sig Dispense Refill  . CALCIUM PO Take 1 tablet by mouth at bedtime.     . Calcium Polycarbophil (FIBERCON PO) Take 2 tablets by mouth every morning.     . carbidopa-levodopa (SINEMET CR) 50-200 MG tablet TAKE 1 TABLET BY MOUTH EVERYDAY AT BEDTIME 90 tablet 1  . carbidopa-levodopa (SINEMET IR) 25-100 MG tablet TAKE 1 TABLET BY MOUTH 3 (THREE) TIMES DAILY. 270 tablet 1  . clonazePAM (KLONOPIN) 0.5 MG tablet Take 1 tablet (0.5 mg total) by mouth at bedtime. 90 tablet 1  . Coenzyme Q10 (CO Q 10 PO) Take 1 tablet by mouth every morning.     Mariane Baumgarten Sodium (COLACE PO) Take 1 capsule  by mouth 2 (two) times daily as needed (constipation).     [name] ELIQUIS 5 MG TABS tablet Take 5 mg by mouth 2 (two) times daily.     [name] ezetimibe (ZETIA) 10 MG tablet Take 10 mg by mouth every other day. Taking the same day as the crestor    . furosemide (LASIX) 40 MG tablet TAKE 1 TABLET TWICE A DAY 180 tablet 3  . levothyroxine (SYNTHROID, LEVOTHROID) 100 MCG tablet Take 50-100 mcg by mouth See admin instructions. Take 100 mg by mouth on Tuesday, Thursday, Saturday and Sunday. Take 50 mg by mouth on Monday, Wednesday and Friday  2  . MAGNESIUM PO Take 1 tablet by mouth at bedtime.     . metoprolol succinate (TOPROL-XL) 25 MG 24 hr tablet TAKE 1 TABLET BY MOUTH EVERY DAY 90 tablet 2  . multivitamin-lutein (OCUVITE-LUTEIN) CAPS capsule Take 1 capsule by mouth daily.    . polyethylene glycol (MIRALAX / GLYCOLAX) packet Take 17 g by mouth 2 (two) times daily. (Patient taking differently: Take 17 g by mouth daily as needed. ) 14 each 0  . potassium chloride (K-DUR,KLOR-CON) 10 MEQ tablet Take 10 mEq by mouth every morning.    . raloxifene (EVISTA) 60 MG tablet Take 60 mg by mouth daily.  3  . rasagiline (AZILECT) 1 MG TABS tablet TAKE 1 TABLET (1 MG TOTAL) BY MOUTH DAILY. 90 tablet 1  . rosuvastatin (CRESTOR) 5 MG tablet Take 2.5 mg by mouth every other day.    .  valACYclovir (VALTREX) 1000 MG tablet Take 1,000 mg by mouth 2 (two) times daily as needed (fever blisters).    [name] VITAMIN D, ERGOCALCIFEROL, PO Take 1,000 Units by mouth daily.      No current facility-administered medications on file prior to visit.     PAST MEDICAL HISTORY:   Past Medical History:  Diagnosis Date  . Anemia   . Arthritis    thumbs  . Atrial tachycardia ([place])   . Atypical atrial flutter ([place])   . Cataract    bilateral  . Chronic anticoagulation    on Xarelto  . Chronic diastolic CHF (congestive heart failure) ([facility])    Echocardiogram (11/24/12): Mild LVH, EF 60%.  . Dysrhythmia   . GERD (gastroesophageal reflux disease)    hx of no meds  . Heart murmur    slight  . Hyperlipidemia    a. Hx of leg weakness while on statin. under control  . Hypertension   . Hypothyroidism   . Leg fracture Dec 21,2011  . Osteoporosis 07/2007  . Parkinson's disease ([place]) 2011  . Persistent atrial fibrillation    s/p atrial fib ablation 11-11-11.   [name] PONV (postoperative nausea and vomiting)   . Presence of permanent cardiac pacemaker    permanent Medtronic  . Shortness of breath    "when walking at incline or stairs"  . Sinus brady-tachy syndrome ([place])   . Squamous carcinoma summer 2015   back of left thigh  . Wrist fracture    right    PAST SURGICAL HISTORY:   Past Surgical History:  Procedure Laterality Date  . ATRIAL FIBRILLATION ABLATION N/A 11/11/2011   Procedure: ATRIAL FIBRILLATION ABLATION;  Surgeon: Thompson Grayer, MD;  Location: [facility] CATH LAB;  Service: Cardiovascular;  Laterality: N/A;  . BREAST BIOPSY  1998  . BREAST SURGERY     benign cyst removed  . CARDIAC ELECTROPHYSIOLOGY STUDY AND ABLATION  5/14, 7/14   convergent AF ablation at [facility] and  subsequent atypical atrial flutter ablation by Dr Lehman Prom  . CARDIOVASCULAR STRESS TEST  06-06-2008   EF 73%  . CARDIOVERSION  09/09/2011   Procedure: CARDIOVERSION;  Surgeon: Darlin Coco, MD;  [facility] ENDOSCOPY;   Service: Cardiovascular;  Laterality: N/A;  to be done by dr. Mare Ferrari  . CARDIOVERSION  02/18/2012   Procedure: CARDIOVERSION;  Surgeon: Thayer Headings, MD;  Location: [place];  Service: Cardiovascular;  Laterality: N/A;  . CHOLECYSTECTOMY  1995  . FEMUR FRACTURE SURGERY  2011   metal pin in place  . FOREARM SURGERY Left 2010   "opened arm to drain it"  . INSERTION OF MESH N/A 11/24/2013   Procedure: INSERTION OF MESH;  Surgeon: [name] Boston, MD;  Location: [facility] ORS;  Service: General;  Laterality: N/A;  . KNEE SURGERY Left 2001  . Left unicompartmental knee     Dr. Alvan Dame 11/18/16  . PACEMAKER PLACEMENT  10/20/12   MDT Advisa DR implanted by Dr Lehman Prom at [facility]   . PARTIAL KNEE ARTHROPLASTY Left 11/18/2016   Procedure: LEFT UNICOMPARTMENTAL KNEE Medially;  Surgeon: Paralee Cancel, MD;  Location: [facility] ORS;  Service: Orthopedics;  Laterality: Left;  90 mins  . SKIN CANCER DESTRUCTION  summer 2015  . TEE WITHOUT CARDIOVERSION  11/10/2011   Procedure: TRANSESOPHAGEAL ECHOCARDIOGRAM (TEE);  Surgeon: Thayer Headings, MD;  Location: [place];  Service: Cardiovascular;  Laterality: N/A;  . TONSILLECTOMY  as child  . US ECHOCARDIOGRAPHY  03-10-2008   Est EF 55-60%, Dr. Cathie Olden  . VENTRAL HERNIA REPAIR N/A 11/24/2013   Procedure: LAPAROSCOPIC VENTRAL HERNIA;  Surgeon: [name] Boston, MD;  Location: [facility];  Service: General;  Laterality: N/A;  . WRIST SURGERY Right ~2009   metal rod in place    SOCIAL HISTORY:   Social History   Socioeconomic History  . Marital status: Married    Spouse name: Not on file  . Number of children: Not on file  . Years of education: Not on file  . Highest education level: Not on file  Occupational History  . Not on file  Social Needs  . Financial resource strain: Not on file  . Food insecurity:    Worry: Not on file    Inability: Not on file  . Transportation needs:    Medical: Not on file    Non-medical: Not on file  Tobacco Use  . Smoking status:  Never Smoker  . Smokeless tobacco: Never Used  Substance and Sexual Activity  . Alcohol use: No  . Drug use: No  . Sexual activity: Yes    Partners: Male    Birth control/protection: Post-menopausal  Lifestyle  . Physical activity:    Days per week: Not on file    Minutes per session: Not on file  . Stress: Not on file  Relationships  . Social connections:    Talks on phone: Not on file    Gets together: Not on file    Attends religious service: Not on file    Active member of club or organization: Not on file    Attends meetings of clubs or organizations: Not on file    Relationship status: Not on file  . Intimate partner violence:    Fear of current or ex partner: Not on file    Emotionally abused: Not on file    Physically abused: Not on file    Forced sexual activity: Not on file  Other Topics Concern  . Not on file  Social History Narrative  Lives in [place].  Retired Licensed conveyancer.    FAMILY HISTORY:   Family Status  Relation Name Status  . Mother  Deceased at age 63       AD   . Father  Deceased at age 56       Valve-rheumatic fever as child  . Child  Alive       healthy  . Child  Alive       healthy  . Mat Aunt  (Not Specified)    ROS:  Review of Systems  Constitutional: Negative.   HENT: Negative.   Eyes: Negative.   Respiratory: Negative.   Cardiovascular: Negative.   Gastrointestinal: Negative.   Genitourinary: Negative.   Musculoskeletal: Negative.   Skin: Negative.   Endo/Heme/Allergies: Negative.     PHYSICAL EXAMINATION:    VITALS:   Vitals:   11/10/17 1513  BP: 94/60  Pulse: 90  SpO2: 98%  Weight: 147 lb (66.7 kg)  Height: 5' 1.5" (1.562 m)    GEN:  The patient appears stated age and is in NAD. HEENT:  Normocephalic, atraumatic.  The mucous membranes are moist. The superficial temporal arteries are without ropiness or tenderness. CV:  irreg Lungs:  CTAB Neck/HEME:  There are no carotid bruits bilaterally.  Neurological  examination:  Orientation: The patient is alert and oriented x3. Cranial nerves: There is good facial symmetry. The speech is fluent and clear. Soft palate rises symmetrically and there is no tongue deviation. Hearing is intact to conversational tone. Sensation: Sensation is intact to light touch throughout Motor: Strength is 5/5 in the bilateral upper and lower extremities.   Shoulder shrug is equal and symmetric.  There is no pronator drift.  Movement examination: Tone: There is normal tone in the upper and lower extremities. Abnormal movements: Mild truncal dyskinesias noted.  No tremor is noted today. Coordination:  There is no decremation with RAM's, with any form of RAMS, including alternating supination and pronation of the forearm, hand opening and closing, finger taps, heel taps and toe taps. Gait and Station: The patient has no difficulty arising out of a deep-seated chair without the use of the hands. The patient's stride length is normal with good armswing today.    ASSESSMENT/PLAN:  1.  idiopathic tremor predominant Parkinson's disease  -continue carbidopa/levodopa 25/100 tid.    -continue carbidopa/levodopa 50/200 at bed.  -She will remain on the Azilect.  -Discussed research and medicines in trial. 2.  Insomnia.  -takes klonopin prn. She is not taking it often (1 time per month) as she is sleeping better with melatonin 3.  Follow-up in 5 months

## 2017-11-09 ENCOUNTER — Ambulatory Visit (INDEPENDENT_AMBULATORY_CARE_PROVIDER_SITE_OTHER): Payer: Medicare Other | Admitting: Obstetrics & Gynecology

## 2017-11-09 ENCOUNTER — Other Ambulatory Visit: Payer: Self-pay

## 2017-11-09 ENCOUNTER — Encounter: Payer: Self-pay | Admitting: Obstetrics & Gynecology

## 2017-11-09 VITALS — BP 102/66 | HR 76 | Resp 18 | Ht 61.25 in | Wt 148.0 lb

## 2017-11-09 DIAGNOSIS — Z124 Encounter for screening for malignant neoplasm of cervix: Secondary | ICD-10-CM | POA: Diagnosis not present

## 2017-11-09 DIAGNOSIS — Z01419 Encounter for gynecological examination (general) (routine) without abnormal findings: Secondary | ICD-10-CM

## 2017-11-09 DIAGNOSIS — K7460 Unspecified cirrhosis of liver: Secondary | ICD-10-CM | POA: Insufficient documentation

## 2017-11-09 DIAGNOSIS — K7469 Other cirrhosis of liver: Secondary | ICD-10-CM

## 2017-11-09 NOTE — Progress Notes (Signed)
77 y.o. G2P2 Married White or Caucasian female here for annual exam.  Doing well.  Had knee replacement done last October with Dr. Alvan Dame.  Reports surgery went really well.  Has seen Dr. Cristina Gong, Dr. Carles Collet, and Dr. Rayann Heman this year as well.  Did have two iron infusions this year.  Did have completed GI evaluation for possible bleeding.  This was negative.    Denies vaginal bleeding.    PCP:  Dr. Joylene Draft. Blood work is UTD.    Patient's last menstrual period was 02/04/1996.          Sexually active: Yes.    The current method of family planning is post menopausal status.    Exercising: Yes.    walk, streitch, weights Smoker:  no  Health Maintenance: Pap:  10/09/16 Neg   05/29/14 Neg.  History of abnormal Pap:  yes MMG:  04/23/17 BIRADS1:Neg  Colonoscopy:  2019 with Dr. Cristina Gong done due to concerns of possible bleeding.  This was negative.  EGD was negative.  Also had small bowel evaluation.    BMD:   2017 TDaP:  PCP Pneumonia vaccine(s):  2014 Shingrix:   completed Hep C testing: n/a Screening Labs: PCP   reports that she has never smoked. She has never used smokeless tobacco. She reports that she does not drink alcohol or use drugs.  Past Medical History:  Diagnosis Date  . Anemia   . Arthritis    thumbs  . Atrial tachycardia ([place])   . Atypical atrial flutter ([place])   . Cataract    bilateral  . Chronic anticoagulation    on Xarelto  . Chronic diastolic CHF (congestive heart failure) (HCC)    Echocardiogram (11/24/12): Mild LVH, EF 60%.  . Dysrhythmia   . GERD (gastroesophageal reflux disease)    hx of no meds  . Heart murmur    slight  . Hyperlipidemia    a. Hx of leg weakness while on statin. under control  . Hypertension   . Hypothyroidism   . Leg fracture Dec 21,2011  . Osteoporosis 07/2007  . Parkinson's disease ([place]) 2011  . Persistent atrial fibrillation    s/p atrial fib ablation 11-11-11.   [name] PONV (postoperative nausea and vomiting)   . Presence of permanent  cardiac pacemaker    permanent Medtronic  . Shortness of breath    "when walking at incline or stairs"  . Sinus brady-tachy syndrome ([place])   . Squamous carcinoma summer 2015   back of left thigh  . Wrist fracture    right    Past Surgical History:  Procedure Laterality Date  . ATRIAL FIBRILLATION ABLATION N/A 11/11/2011   Procedure: ATRIAL FIBRILLATION ABLATION;  Surgeon: Thompson Grayer, MD;  Location: [facility] CATH LAB;  Service: Cardiovascular;  Laterality: N/A;  . BREAST BIOPSY  1998  . BREAST SURGERY     benign cyst removed  . CARDIAC ELECTROPHYSIOLOGY STUDY AND ABLATION  5/14, 7/14   convergent AF ablation at [facility] and subsequent atypical atrial flutter ablation by Dr Lehman Prom  . CARDIOVASCULAR STRESS TEST  06-06-2008   EF 73%  . CARDIOVERSION  09/09/2011   Procedure: CARDIOVERSION;  Surgeon: Darlin Coco, MD;  Location: [facility] ENDOSCOPY;  Service: Cardiovascular;  Laterality: N/A;  to be done by dr. Mare Ferrari  . CARDIOVERSION  02/18/2012   Procedure: CARDIOVERSION;  Surgeon: Thayer Headings, MD;  Location: [place];  Service: Cardiovascular;  Laterality: N/A;  . CHOLECYSTECTOMY  1995  . FEMUR FRACTURE SURGERY  2011  metal pin in place  . FOREARM SURGERY Left 2010   "opened arm to drain it"  . INSERTION OF MESH N/A 11/24/2013   Procedure: INSERTION OF MESH;  Surgeon: Michael Boston, MD;  [facility]: [facility];  Service: General;  Laterality: N/A;  . KNEE SURGERY Left 2001  . Left unicompartmental knee     Dr. Alvan Dame 11/18/16  . PACEMAKER PLACEMENT  10/20/12   MDT Advisa DR implanted by Dr Lehman Prom at [facility]   . PARTIAL KNEE ARTHROPLASTY Left 11/18/2016   Procedure: LEFT UNICOMPARTMENTAL KNEE Medially;  Surgeon: Paralee Cancel, MD;  Location: [facility];  Service: Orthopedics;  Laterality: Left;  90 mins  . SKIN CANCER DESTRUCTION  summer 2015  . TEE WITHOUT CARDIOVERSION  11/10/2011   Procedure: TRANSESOPHAGEAL ECHOCARDIOGRAM (TEE);  Surgeon: Thayer Headings, MD;  Location: [place];  Service:  Cardiovascular;  Laterality: N/A;  . TONSILLECTOMY  as child  . US ECHOCARDIOGRAPHY  03-10-2008   Est EF 55-60%, Dr. Cathie Olden  . VENTRAL HERNIA REPAIR N/A 11/24/2013   Procedure: LAPAROSCOPIC VENTRAL HERNIA;  Surgeon: Michael Boston, MD;  [facility];  Service: General;  Laterality: N/A;  . WRIST SURGERY Right ~2009   metal rod in place    Current Outpatient Medications  Medication Sig Dispense Refill  . CALCIUM PO Take 1 tablet by mouth at bedtime.     . Calcium Polycarbophil (FIBERCON PO) Take 2 tablets by mouth every morning.     . carbidopa-levodopa (SINEMET CR) 50-200 MG tablet TAKE 1 TABLET BY MOUTH EVERYDAY AT BEDTIME 90 tablet 1  . carbidopa-levodopa (SINEMET IR) 25-100 MG tablet TAKE 1 TABLET BY MOUTH 3 (THREE) TIMES DAILY. 270 tablet 1  . clonazePAM (KLONOPIN) 0.5 MG tablet Take 1 tablet (0.5 mg total) by mouth at bedtime. 90 tablet 1  . Coenzyme Q10 (CO Q 10 PO) Take 1 tablet by mouth every morning.     Mariane Baumgarten Sodium (COLACE PO) Take 1 capsule by mouth 2 (two) times daily as needed (constipation).     [name] ELIQUIS 5 MG TABS tablet Take 5 mg by mouth 2 (two) times daily.     [name] ezetimibe (ZETIA) 10 MG tablet Take 10 mg by mouth every other day. Taking the same day as the crestor    . furosemide (LASIX) 40 MG tablet TAKE 1 TABLET TWICE A DAY 180 tablet 3  . levothyroxine (SYNTHROID, LEVOTHROID) 100 MCG tablet Take 50-100 mcg by mouth See admin instructions. Take 100 mg by mouth on Tuesday, Thursday, Saturday and Sunday. Take 50 mg by mouth on Monday, Wednesday and Friday  2  . MAGNESIUM PO Take 1 tablet by mouth at bedtime.     . metoprolol succinate (TOPROL-XL) 25 MG 24 hr tablet TAKE 1 TABLET BY MOUTH EVERY DAY 90 tablet 2  . multivitamin-lutein (OCUVITE-LUTEIN) CAPS capsule Take 1 capsule by mouth daily.    . polyethylene glycol (MIRALAX / GLYCOLAX) packet Take 17 g by mouth 2 (two) times daily. (Patient taking differently: Take 17 g by mouth daily as needed. ) 14 each 0  .  potassium chloride (K-DUR,KLOR-CON) 10 MEQ tablet Take 10 mEq by mouth every morning.    . raloxifene (EVISTA) 60 MG tablet Take 60 mg by mouth daily.  3  . rasagiline (AZILECT) 1 MG TABS tablet TAKE 1 TABLET (1 MG TOTAL) BY MOUTH DAILY. 90 tablet 1  . rosuvastatin (CRESTOR) 5 MG tablet Take 2.5 mg by mouth every other day.    . valACYclovir (  VALTREX) 1000 MG tablet Take 1,000 mg by mouth 2 (two) times daily as needed (fever blisters).    [name] VITAMIN D, ERGOCALCIFEROL, PO Take 1,000 Units by mouth daily.      No current facility-administered medications for this visit.     Family History  Problem Relation Age of Onset  . Alzheimer's disease Mother   . Heart failure Father   . Breast cancer Maternal Aunt     Review of Systems  All other systems reviewed and are negative.   Exam:   BP 102/66 (BP Location: Left Arm, Patient Position: Sitting, Cuff Size: Large)   Pulse 76   Resp 18   Ht 5' 1.25" (1.556 m)   Wt 148 lb (67.1 kg)   LMP 02/04/1996   BMI 27.74 kg/m    Height: 5' 1.25" (155.6 cm)  Ht Readings from Last 3 Encounters:  11/09/17 5' 1.25" (1.556 m)  06/23/17 5\' 2"  (1.575 m)  03/09/17 5\' 2"  (1.575 m)    General appearance: alert, cooperative and appears stated age Head: Normocephalic, without obvious abnormality, atraumatic Neck: no adenopathy, supple, symmetrical, trachea midline and thyroid normal to inspection and palpation Lungs: clear to auscultation bilaterally Breasts: normal appearance, no masses or tenderness Heart: regular rate and rhythm Abdomen: soft, non-tender; bowel sounds normal; no masses,  no organomegaly Extremities: extremities normal, atraumatic, no cyanosis or edema Skin: Skin color, texture, turgor normal. No rashes or lesions Lymph nodes: Cervical, supraclavicular, and axillary nodes normal. No abnormal inguinal nodes palpated Neurologic: Grossly normal   Pelvic: External genitalia:  no lesions              Urethra:  normal appearing urethra  with no masses, tenderness or lesions              Bartholins and Skenes: normal                 Vagina: normal appearing vagina with normal color and discharge, no lesions              Cervix: no lesions              Pap taken: No. Bimanual Exam:  Uterus:  normal size, contour, position, consistency, mobility, non-tender              Adnexa: normal adnexa and no mass, fullness, tenderness               Rectovaginal: Confirms               Anus:  normal sphincter tone, no lesions  Chaperone was present for exam.  A:  Well Woman with normal exam PMP, no HRT H/O Afib, sick sinus syndrome, chronic diastolic CHF (followed by Dr. Rayann Heman) Hypothyroidism Osteoporosis Parkinson's Disease  P:   Mammogram guidelines reviewed.  Doing 3D MMG. pap smear 2018 was negative.  Labs and vaccines are UTD Release for GI records signed today. return annually or prn

## 2017-11-10 ENCOUNTER — Encounter: Payer: Self-pay | Admitting: Neurology

## 2017-11-10 ENCOUNTER — Ambulatory Visit (INDEPENDENT_AMBULATORY_CARE_PROVIDER_SITE_OTHER): Payer: Medicare Other | Admitting: Neurology

## 2017-11-10 VITALS — BP 94/60 | HR 90 | Ht 61.5 in | Wt 147.0 lb

## 2017-11-10 DIAGNOSIS — G4700 Insomnia, unspecified: Secondary | ICD-10-CM

## 2017-11-10 DIAGNOSIS — G20 Parkinson's disease: Secondary | ICD-10-CM | POA: Diagnosis not present

## 2017-11-23 ENCOUNTER — Other Ambulatory Visit: Payer: Self-pay | Admitting: Neurology

## 2017-12-09 ENCOUNTER — Other Ambulatory Visit: Payer: Self-pay | Admitting: Internal Medicine

## 2017-12-30 ENCOUNTER — Telehealth: Payer: Self-pay | Admitting: Cardiology

## 2017-12-30 NOTE — Telephone Encounter (Signed)
LMOVM reminding pt to send remote transmission.   

## 2018-01-05 DIAGNOSIS — I509 Heart failure, unspecified: Secondary | ICD-10-CM | POA: Diagnosis not present

## 2018-01-05 DIAGNOSIS — Z6827 Body mass index (BMI) 27.0-27.9, adult: Secondary | ICD-10-CM | POA: Diagnosis not present

## 2018-01-05 DIAGNOSIS — D649 Anemia, unspecified: Secondary | ICD-10-CM | POA: Diagnosis not present

## 2018-01-05 DIAGNOSIS — G20 Parkinson's disease: Secondary | ICD-10-CM | POA: Diagnosis not present

## 2018-01-05 DIAGNOSIS — I480 Paroxysmal atrial fibrillation: Secondary | ICD-10-CM | POA: Diagnosis not present

## 2018-01-05 DIAGNOSIS — M810 Age-related osteoporosis without current pathological fracture: Secondary | ICD-10-CM | POA: Diagnosis not present

## 2018-01-05 DIAGNOSIS — Z950 Presence of cardiac pacemaker: Secondary | ICD-10-CM | POA: Diagnosis not present

## 2018-01-05 DIAGNOSIS — E038 Other specified hypothyroidism: Secondary | ICD-10-CM | POA: Diagnosis not present

## 2018-01-07 ENCOUNTER — Encounter: Payer: Self-pay | Admitting: Cardiology

## 2018-01-12 ENCOUNTER — Ambulatory Visit (INDEPENDENT_AMBULATORY_CARE_PROVIDER_SITE_OTHER): Payer: Medicare Other

## 2018-01-12 DIAGNOSIS — I495 Sick sinus syndrome: Secondary | ICD-10-CM | POA: Diagnosis not present

## 2018-01-12 DIAGNOSIS — I4819 Other persistent atrial fibrillation: Secondary | ICD-10-CM

## 2018-01-14 ENCOUNTER — Ambulatory Visit (INDEPENDENT_AMBULATORY_CARE_PROVIDER_SITE_OTHER): Payer: Medicare Other | Admitting: Internal Medicine

## 2018-01-14 ENCOUNTER — Encounter: Payer: Self-pay | Admitting: Internal Medicine

## 2018-01-14 VITALS — BP 122/72 | HR 99 | Ht 61.5 in | Wt 142.8 lb

## 2018-01-14 DIAGNOSIS — R0602 Shortness of breath: Secondary | ICD-10-CM

## 2018-01-14 DIAGNOSIS — Z950 Presence of cardiac pacemaker: Secondary | ICD-10-CM

## 2018-01-14 DIAGNOSIS — I4819 Other persistent atrial fibrillation: Secondary | ICD-10-CM

## 2018-01-14 DIAGNOSIS — I495 Sick sinus syndrome: Secondary | ICD-10-CM | POA: Diagnosis not present

## 2018-01-14 DIAGNOSIS — I10 Essential (primary) hypertension: Secondary | ICD-10-CM | POA: Diagnosis not present

## 2018-01-14 NOTE — Patient Instructions (Addendum)
Medication Instructions:  Your physician recommends that you continue on your current medications as directed. Please refer to the Current Medication list given to you today.  Labwork: None ordered.  Testing/Procedures: Your physician has requested that you have an echocardiogram. Echocardiography is a painless test that uses sound waves to create images of your heart. It provides your doctor with information about the size and shape of your heart and how well your heart's chambers and valves are working. This procedure takes approximately one hour. There are no restrictions for this procedure.  Please schedule for ECHO  Your physician has requested that you have a lexiscan myoview. For further information please visit [email or web address]. Please follow instruction sheet, as given.  Please schedule for lexiscan myoview  Follow-Up: Your physician wants you to follow-up in: one year with Dr. Rayann Heman.   You will receive a reminder letter in the mail two months in advance. If you don't receive a letter, please call our office to schedule the follow-up appointment.  Remote monitoring is used to monitor your Pacemaker from home. This monitoring reduces the number of office visits required to check your device to one time per year. It allows [place] to keep an eye on the functioning of your device to ensure it is working properly. You are scheduled for a device check from home on 04/13/2018. You may send your transmission at any time that day. If you have a wireless device, the transmission will be sent automatically. After your physician reviews your transmission, you will receive a postcard with your next transmission date.  Any Other Special Instructions Will Be Listed Below (If Applicable).  If you need a refill on your cardiac medications before your next appointment, please call your pharmacy.

## 2018-01-14 NOTE — Progress Notes (Signed)
Remote pacemaker transmission.   

## 2018-01-14 NOTE — Progress Notes (Signed)
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 female who presents today for routine electrophysiology followup.  Since last being seen in our clinic, the patient reports doing very well.  She remains active.  She does have SOB with moderate activity.  Overall, SOB remains much improved with iron infusions. Today, she denies symptoms of palpitations, chest pain,  lower extremity edema, dizziness, presyncope, or syncope.  The patient is otherwise without complaint today.   Past Medical History:  Diagnosis Date  . Anemia   . Arthritis    thumbs  . Atrial tachycardia ([place])   . Atypical atrial flutter ([place])   . Cataract    bilateral  . Chronic anticoagulation    on Xarelto  . Chronic diastolic CHF (congestive heart failure) (HCC)    Echocardiogram (11/24/12): Mild LVH, EF 60%.  . Dysrhythmia   . GERD (gastroesophageal reflux disease)    hx of no meds  . Heart murmur    slight  . Hyperlipidemia    a. Hx of leg weakness while on statin. under control  . Hypertension   . Hypothyroidism   . Leg fracture Dec 21,2011  . Osteoporosis 07/2007  . Parkinson's disease ([place]) 2011  . Persistent atrial fibrillation    s/p atrial fib ablation 11-11-11.   [name] PONV (postoperative nausea and vomiting)   . Presence of permanent cardiac pacemaker    permanent Medtronic  . Shortness of breath    "when walking at incline or stairs"  . Sinus brady-tachy syndrome ([place])   . Squamous carcinoma summer 2015   back of left thigh  . Wrist fracture    right   Past Surgical History:  Procedure Laterality Date  . ATRIAL FIBRILLATION ABLATION N/A 11/11/2011   Procedure: ATRIAL FIBRILLATION ABLATION;  Surgeon: Thompson Grayer, MD;  Location[facility] CATH LAB;  Service: Cardiovascular;  Laterality: N/A;  . BREAST BIOPSY  1998  . BREAST SURGERY     benign cyst removed  . CARDIAC ELECTROPHYSIOLOGY STUDY AND ABLATION  5/14, 7/14   convergent AF ablation at [facility] and subsequent atypical  atrial flutter ablation by Dr Lehman Prom  . CARDIOVASCULAR STRESS TEST  06-06-2008   EF 73%  . CARDIOVERSION  09/09/2011   Procedure: CARDIOVERSION;  Surgeon: Darlin Coco, MD;  Location: [facility] ENDOSCOPY;  Service: Cardiovascular;  Laterality: N/A;  to be done by dr. Mare Ferrari  . CARDIOVERSION  02/18/2012   Procedure: CARDIOVERSION;  Surgeon: Thayer Headings, MD;  Location: [place];  Service: Cardiovascular;  Laterality: N/A;  . CHOLECYSTECTOMY  1995  . FEMUR FRACTURE SURGERY  2011   metal pin in place  . FOREARM SURGERY Left 2010   "opened arm to drain it"  . INSERTION OF MESH N/A 11/24/2013   Procedure: INSERTION OF MESH;  Surgeon: Michael Boston, MD;  [facility];  Service: General;  Laterality: N/A;  . KNEE SURGERY Left 2001  . Left unicompartmental knee     Dr. Alvan Dame 11/18/16  . PACEMAKER PLACEMENT  10/20/12   MDT Advisa DR implanted by Dr Lehman Prom at [facility]   . PARTIAL KNEE ARTHROPLASTY Left 11/18/2016   Procedure: LEFT UNICOMPARTMENTAL KNEE Medially;  Surgeon: Paralee Cancel, MD;  [facility];  Service: Orthopedics;  Laterality: Left;  90 mins  . SKIN CANCER DESTRUCTION  summer 2015  . TEE WITHOUT CARDIOVERSION  11/10/2011   Procedure: TRANSESOPHAGEAL ECHOCARDIOGRAM (TEE);  Surgeon: Thayer Headings, MD;  Location: [place];  Service: Cardiovascular;  Laterality: N/A;  . TONSILLECTOMY  as child  . US ECHOCARDIOGRAPHY  03-10-2008   Est EF 55-60%, Dr. Cathie Olden  . VENTRAL HERNIA REPAIR N/A 11/24/2013   Procedure: LAPAROSCOPIC VENTRAL HERNIA;  Surgeon: Michael Boston, MD;  [facility];  Service: General;  Laterality: N/A;  . WRIST SURGERY Right ~2009   metal rod in place    ROS- all systems are reviewed and negative except as per HPI above  Current Outpatient Medications  Medication Sig Dispense Refill  . CALCIUM PO Take 1 tablet by mouth at bedtime.     . Calcium Polycarbophil (FIBERCON PO) Take 2 tablets by mouth every morning.     . carbidopa-levodopa (SINEMET CR) 50-200  MG tablet TAKE 1 TABLET BY MOUTH EVERYDAY AT BEDTIME 90 tablet 1  . carbidopa-levodopa (SINEMET IR) 25-100 MG tablet TAKE 1 TABLET BY MOUTH THREE TIMES A DAY 270 tablet 1  . clonazePAM (KLONOPIN) 0.5 MG tablet Take 1 tablet (0.5 mg total) by mouth at bedtime. 90 tablet 1  . Coenzyme Q10 (CO Q 10 PO) Take 1 tablet by mouth every morning.     Mariane Baumgarten Sodium (COLACE PO) Take 1 capsule by mouth 2 (two) times daily as needed (constipation).     [name] ELIQUIS 5 MG TABS tablet Take 5 mg by mouth 2 (two) times daily.     [name] ezetimibe (ZETIA) 10 MG tablet Take 10 mg by mouth every other day. Taking the same day as the crestor    . furosemide (LASIX) 40 MG tablet TAKE 1 TABLET TWICE A DAY 180 tablet 0  . levothyroxine (SYNTHROID, LEVOTHROID) 100 MCG tablet Take 50-100 mcg by mouth See admin instructions. Take 100 mg by mouth on Tuesday, Thursday, Saturday and Sunday. Take 50 mg by mouth on Monday, Wednesday and Friday  2  . MAGNESIUM PO Take 1 tablet by mouth at bedtime.     . metoprolol succinate (TOPROL-XL) 25 MG 24 hr tablet TAKE 1 TABLET BY MOUTH EVERY DAY 90 tablet 2  . multivitamin-lutein (OCUVITE-LUTEIN) CAPS capsule Take 1 capsule by mouth daily.    . polyethylene glycol (MIRALAX / GLYCOLAX) packet Take 17 g by mouth 2 (two) times daily. (Patient taking differently: Take 17 g by mouth daily as needed. ) 14 each 0  . potassium chloride (K-DUR,KLOR-CON) 10 MEQ tablet Take 10 mEq by mouth every morning.    . raloxifene (EVISTA) 60 MG tablet Take 60 mg by mouth daily.  3  . rasagiline (AZILECT) 1 MG TABS tablet TAKE 1 TABLET (1 MG TOTAL) BY MOUTH DAILY. 90 tablet 1  . rosuvastatin (CRESTOR) 5 MG tablet Take 2.5 mg by mouth every other day.    . valACYclovir (VALTREX) 1000 MG tablet Take 1,000 mg by mouth 2 (two) times daily as needed (fever blisters).    [name] VITAMIN D, ERGOCALCIFEROL, PO Take 1,000 Units by mouth daily.      No current facility-administered medications for this visit.     Physical  Exam: Vitals:   01/14/18 1229  BP: 122/72  Pulse: 99  SpO2: 97%  Weight: 142 lb 12.8 oz (64.8 kg)  Height: 5' 1.5" (1.562 m)    GEN- The patient is well appearing, alert and oriented x 3 today.   Head- normocephalic, atraumatic Eyes-  Sclera clear, conjunctiva pink Ears- hearing intact Oropharynx- clear Lungs- Clear to ausculation bilaterally, normal work of breathing Chest- pacemaker pocket is well healed Heart- irregular rate and rhythm, no murmurs, rubs or  gallops, PMI not laterally displaced GI- soft, NT, ND, + BS Extremities- no clubbing, cyanosis, or edema  Pacemaker interrogation- reviewed in detail today,  See PACEART report  ekg tracing ordered today is personally reviewed and shows afib, PVCs, diffuse TW inversions are more prominent that prior ekgs  Assessment and Plan:  1. Symptomatic bradycardia  Normal pacemaker function See Pace Art report Programmed VVIR today from DDD to promote battery longevity  2. Longstanding persistent afib Rate controlled Anticoagulated  3. HTN Stable No change required today  4. SOB Given more prominent TWI on ekg today, will order echo and myoview to further evaluate for cardiac causes of SOB.  If low risk, no further testing is planned.  carelink Return in a year  Thompson Grayer MD, [facility] 01/14/2018 12:56 PM

## 2018-02-09 ENCOUNTER — Telehealth (HOSPITAL_COMMUNITY): Payer: Self-pay

## 2018-02-09 NOTE — Telephone Encounter (Signed)
Detailed message left for the pt for her stress test.Asked to call back with any questions. S.Savvas Roper EMTP

## 2018-02-11 ENCOUNTER — Other Ambulatory Visit: Payer: Self-pay

## 2018-02-11 ENCOUNTER — Ambulatory Visit (HOSPITAL_COMMUNITY): Payer: Medicare Other | Attending: Cardiovascular Disease

## 2018-02-11 ENCOUNTER — Ambulatory Visit (HOSPITAL_BASED_OUTPATIENT_CLINIC_OR_DEPARTMENT_OTHER): Payer: Medicare Other

## 2018-02-11 DIAGNOSIS — R0602 Shortness of breath: Secondary | ICD-10-CM | POA: Diagnosis not present

## 2018-02-11 LAB — MYOCARDIAL PERFUSION IMAGING
CHL CUP NUCLEAR SDS: 0
CHL CUP NUCLEAR SRS: 0
CSEPPHR: 103 {beats}/min
LV dias vol: 56 mL (ref 46–106)
LV sys vol: 16 mL
NUC STRESS TID: 0.96
Rest HR: 99 {beats}/min
SSS: 0

## 2018-02-11 LAB — ECHOCARDIOGRAM COMPLETE
Height: 61.5 in
WEIGHTICAEL: 2272 [oz_av]

## 2018-02-11 MED ORDER — TECHNETIUM TC 99M TETROFOSMIN IV KIT
31.8000 | PACK | Freq: Once | INTRAVENOUS | Status: AC | PRN
  Administered 2018-02-11: 31.8 via INTRAVENOUS
  Filled 2018-02-11: qty 32

## 2018-02-11 MED ORDER — REGADENOSON 0.4 MG/5ML IV SOLN
0.4000 mg | Freq: Once | INTRAVENOUS | Status: AC
  Administered 2018-02-11: 0.4 mg via INTRAVENOUS

## 2018-02-11 MED ORDER — TECHNETIUM TC 99M TETROFOSMIN IV KIT
10.9000 | PACK | Freq: Once | INTRAVENOUS | Status: AC | PRN
  Administered 2018-02-11: 10.9 via INTRAVENOUS
  Filled 2018-02-11: qty 11

## 2018-02-15 DIAGNOSIS — D225 Melanocytic nevi of trunk: Secondary | ICD-10-CM | POA: Diagnosis not present

## 2018-02-15 DIAGNOSIS — L570 Actinic keratosis: Secondary | ICD-10-CM | POA: Diagnosis not present

## 2018-02-15 DIAGNOSIS — L304 Erythema intertrigo: Secondary | ICD-10-CM | POA: Diagnosis not present

## 2018-02-15 DIAGNOSIS — L814 Other melanin hyperpigmentation: Secondary | ICD-10-CM | POA: Diagnosis not present

## 2018-02-15 DIAGNOSIS — L821 Other seborrheic keratosis: Secondary | ICD-10-CM | POA: Diagnosis not present

## 2018-02-15 DIAGNOSIS — L98499 Non-pressure chronic ulcer of skin of other sites with unspecified severity: Secondary | ICD-10-CM | POA: Diagnosis not present

## 2018-02-15 DIAGNOSIS — Z85828 Personal history of other malignant neoplasm of skin: Secondary | ICD-10-CM | POA: Diagnosis not present

## 2018-02-21 LAB — CUP PACEART REMOTE DEVICE CHECK
Battery Remaining Longevity: 21 mo
Battery Voltage: 2.94 V
Brady Statistic AP VP Percent: 13.11 %
Brady Statistic AP VS Percent: 0.43 %
Brady Statistic AS VP Percent: 48.39 %
Brady Statistic AS VS Percent: 38.06 %
Brady Statistic RA Percent Paced: 10.99 %
Brady Statistic RV Percent Paced: 56.15 %
Date Time Interrogation Session: 20191210154733
Implantable Lead Implant Date: 20140917
Implantable Lead Implant Date: 20140917
Implantable Lead Location: 753859
Implantable Lead Location: 753860
Implantable Pulse Generator Implant Date: 20140917
Lead Channel Impedance Value: 342 Ohm
Lead Channel Impedance Value: 361 Ohm
Lead Channel Impedance Value: 380 Ohm
Lead Channel Impedance Value: 456 Ohm
Lead Channel Pacing Threshold Amplitude: 0.875 V
Lead Channel Pacing Threshold Amplitude: 1 V
Lead Channel Pacing Threshold Pulse Width: 0.4 ms
Lead Channel Pacing Threshold Pulse Width: 0.4 ms
Lead Channel Sensing Intrinsic Amplitude: 0.5 mV
Lead Channel Sensing Intrinsic Amplitude: 0.5 mV
Lead Channel Sensing Intrinsic Amplitude: 13.25 mV
Lead Channel Sensing Intrinsic Amplitude: 13.25 mV
Lead Channel Setting Pacing Amplitude: 2 V
Lead Channel Setting Pacing Amplitude: 2.5 V
Lead Channel Setting Pacing Pulse Width: 0.4 ms
Lead Channel Setting Sensing Sensitivity: 0.9 mV

## 2018-03-12 ENCOUNTER — Other Ambulatory Visit: Payer: Self-pay | Admitting: Internal Medicine

## 2018-03-15 ENCOUNTER — Other Ambulatory Visit: Payer: Self-pay | Admitting: Internal Medicine

## 2018-03-31 DIAGNOSIS — M25531 Pain in right wrist: Secondary | ICD-10-CM | POA: Diagnosis not present

## 2018-04-02 DIAGNOSIS — M25562 Pain in left knee: Secondary | ICD-10-CM | POA: Diagnosis not present

## 2018-04-03 ENCOUNTER — Other Ambulatory Visit: Payer: Self-pay | Admitting: Internal Medicine

## 2018-04-07 DIAGNOSIS — E038 Other specified hypothyroidism: Secondary | ICD-10-CM | POA: Diagnosis not present

## 2018-04-08 ENCOUNTER — Other Ambulatory Visit: Payer: Self-pay | Admitting: Internal Medicine

## 2018-04-08 DIAGNOSIS — Z1231 Encounter for screening mammogram for malignant neoplasm of breast: Secondary | ICD-10-CM

## 2018-04-12 NOTE — Progress Notes (Signed)
[name] [name] was seen today in the movement disorders clinic for neurologic consultation at the request of Crist Infante, MD.  The consultation is for the evaluation of PD.  She has previously seen [name] and [name].  I have some of [name] notes.  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  She had a normal MRI brain at that time according to notes from Dr. love.  In 2011, her diagnosis was changed to Parkinson's disease and Azilect was added.  She had a second opinion at [facility] on 07/25/2009 and [name] also thought that she had Parkinson's disease.  In January, 2014 [name] added Artane, presumably for tremor.  She does think that it has helped the tremor.  02/22/13 update:  The pt is f/u today re: PD.  After our discussion last visit, she decided to stop the artane and states that while she noted a little increase in tremor, it hasn't been bad.  I had wanted to start her on carbidopa/levodopa 25/100 but she wanted to hold on that last vist.  Balance is good but she has felt more slow and stiff.  No falls.  No hallucinations.  No n/v.  No near syncope.  Not exercising faithfully.  06/20/13 update:  The pt is f/u today re: PD.  She was started on carbidopa/levodopa 25/100 last visit in January in addition to the azilect that she was already on.  She just returned from a trip from the [place] and was so surprised that she could do all of the planned excursions.  She felt great that she could do all the hiking and could get in and out of the zodiac boat.  She states that she is so glad she started taking the levodopa and thinks that her mood is better.  Her coordination and tremor are better but she still has some tremor.  Some minor stomach upset.  Pharmacist told her to take it with food and she admits that she is taking it with protein.  She last took levodopa at 7:30 am and was examined at 10am.  She is having difficulty staying asleep.  10/13/13 update:  Pt is on carbidopa/levodopa 25/100  and is supposed to be on one tid but admits that she is only taking 1/2 po bid. Did this because of GI upset.   She is having more tremor especially when walking in the L hand; she is not sure if it is any particular time of day.  It doesn't bother her too much.  Some increased difficulty buttoning clothing.  Has a URI and is leaving for [place] on Friday.  She is going for 3 weeks.    02/13/14 update:  The patient returns today for follow-up.  She reports that she is not moving as fluidly as she previously was.  Last time I told her that she needed to really increase her medication to one tablet 3 times per day but she did not do that and she is only taking one twice a day; interestingly she gets the first and the middle of the day dosage in and forgets the last.  She does get nighttime (leg/toe) cramping.  She did have a ventral hernia repair on 11/24/2013.  States that she had a difficult time recovering as she had a hematoma after.  Could not walk after surgery and has not been back to exercise but knows that she needs to.  No falls.  Little tremor.    06/07/14 update:  The patient is f/u for PD.  She is still on carbidopa/levodopa 25/100 twice per day.  She did better clinically on tid dosing but backed down on it to bid on her own.   She states that "sometimes" she takes the third dose but she often is now missing the middle of the day dose.  Sometimes, she only takes it once a day.   She is walking well; no falls.  She is exercising but states that she just had a cortisone injection into the left knee so that was a little limited.  She is having an ablation for a-fib next tues.  She states that it will be done at [facility] and it is a 3 hr procedure (because it is internal not external).  No lightheadedness.  She is still on azilect faithfully.  She takes klonopin about 1-2 times per week for insomnia.    10/11/14 update:  The patient follows up today for Parkinson's disease.  Last visit, I encouraged her  to go back up on her carbidopa/levodopa from twice per day to 3 times per day as she was more stiff and rigid last visit.  She may or may not do that but finds that most days she takes it bid. She has had some toe curling but is unsure if it is related to timing of meds.   She remains on Azilect as well as clonazepam for her insomnia.  She reports that she sleeps with this but she admits she doesn't take it regularly.  After some discussion today, we realized that she acidentally was taking temazepam instead of the klonopin.    She reports that she had another cardiac ablation since last visit and thought that it originally didn't work but now she thinks that it is.    02/14/15 update:  The patient follows up today.  She is supposed to be on carbidopa/levodopa 25/100, 3 times a day, but she generally takes it twice a day.  She is on Azilect.  Since our last visit, she has started amiodarone.  She denies falls.  She denies hallucinations.  She denies lightheadedness or near syncope.  Her husband thinks that her voice isn't as good.  She has had some SOB and wonders if it isn't from the amiodarone (but thinks that she recently had some altitidue sickness as well skiing in the mountains); she is f/u with Dr. Rayann Heman and is to have a CXR.  She does think that SOB is getting better.  She has not been able to exercise as much because of the SOB.  [place] [facility] [place].  She is having some trouble sleeping even with klonopin.    06/14/15 update:  The patient is following up today.  I have reviewed records since previous visit.  Last visit, encouraged her to take carbidopa/levodopa 25/100, 3 times per day, as she was often only taking it twice per day, with the second dosage at bedtime.  States that she still has trouble remembering to take it and she often only takes it once a day.   She remains on Azilect.  She is also on clonazepam for insomnia but is not using it faithfully because she  doesn't think that it helps.  Mood has been okay, but admits that it goes up and down depending on how she is doing medically.  Because atrial arrhythmias, she was started on amiodarone in November, 2016.  This was increased on 05/28/2015 to 200 mg twice  a day but it was decreased back to once per day yesterday.  Pt states that she has been so SOB and she was told that it was likely from the amiodarone.  Because of SOB, she hasn't been able to exercise.  She has had 3 falls.  States that with one she was walking and talking to someone behind her and tripped over a curb.  With one, she fell when an elevator door closed on her.  With one fall, her shoe came off and she tripped.  No hallucinations.    11/05/15 update:  The patient follows up today for her Parkinson's disease.  She has been noncompliant with levodopa dosing, and I again encouraged her last visit to take carbidopa/levodopa 25/100, one tablet 3 times per day.  She isn't doing this and is only taking one per day.   She states that she is doing well, however.  She had 2 falls since our last visit but states that during her PT screen she did well and wasn't considered a fall risk.  With the falls, she was outside on an uneven surface and just tripped.  She is on Azilect, 1 mg daily.  Remains on amiodarone (but had dx of PD prior to its use) but dose reduced at end of Aug by Dr. Rayann Heman and if she continues to do well, plans are to d/c that medication.  I reviewed cardiology records.  No falls since last visit.  Had a pleural effusion and subsequent thoracentesis.  No malignant cells on cytology.  States that she just had another xray with Dr. Joylene Draft.  Sleeping better than she was previously  02/06/16 update:  The patient follows up today.  She is on carbidopa/levodopa 25/100, one tablet three times per day.  She is more faithful about tid but the last one is at bedtime, as she states that it helps her relax.   She remains on Azilect, 1 mg daily.  The records  that were made available to me were reviewed.  Dr. Rayann Heman d/c her amiodarone at the end of November.  "I feel much better off of that medication."   2 falls by tripping on shoes.  Didn't get hurt.   Pt denies lightheadedness, near syncope.  No hallucinations.  Mood has been good.  Exercising some with PT exercises.    06/04/16 update:  Patient seen today in follow-up.  She is on carbidopa/levodopa 25/100, one tablet 3 times per day, but often times the last one is at bedtime.  She reports more cramping of the legs in the middle of the night.  She remains on Azilect, 1 mg daily.  She denies any falls.  She denies lightheadedness or near syncope.  No hallucinations.  Referrals were sent for the patient to attend Parkinson's therapy rehabilitation in February, but it looks like she was called and she did not call back to schedule.  10/09/16 update:  Patient seen today in follow-up for Parkinson's disease.  She is on carbidopa/levodopa 25/100, one tablet 3 times per day.  Last visit, I added carbidopa/levodopa 50/200 at night.  She states that it didn't initially make a difference but her cramping is much better now.  She admits that she is taking medication more regularly.  She is still on Azilect.  No falls.  She has been better lately about exercise.  Her husband just had CABG 3 weeks ago.  Takes klonopin but not faithfully.    02/19/17 update: Patient is seen today in follow-up for  Parkinson's disease.  Patient is on carbidopa/levodopa, 1 tablet 3 times per day and carbidopa/levodopa 50/200 at night.  States that sometimes the pharmacy will mess those 2 RXs up.  She is also on Azilect.  Pt denies falls.  Pt denies lightheadedness, near syncope.  No hallucinations.  Mood has been good.  Rarely will take klonopin.  Thinks it works better if she takes it prn.  The records that were made available to me were reviewed.  She had a knee replacement on the L in October.  She recovered really well but it is still a little  sore.   Went to [place] in [place] for [place].  She did get a recumbant bike for xmas.    06/23/17 update: Patient is seen today in follow-up for Parkinson's disease.  Patient is on carbidopa/levodopa 25/100, 1 tablet 3 times per day and carbidopa/levodopa 50/200 at bedtime.  She is also on Azilect.  Pt denies falls.  Pt denies lightheadedness, near syncope.  No hallucinations.  Mood has been good.  She is exercising.  She is having intermittent trouble sleeping.  She states it may be due to days she doesn't exercise, she may have more trouble.  Records have been reviewed since our last visit.  She has seen cardiology.  No changes were made to her medication regimen.  States that she had stomach pain and had "45,000 tests" and it was all neg.  She wondered if it is IBS as grease made worse.    11/10/17 update: Patient is seen today in follow-up for Parkinson's disease.  Remains on carbidopa/levodopa 25/100, 1 tablet 3 times per day in addition to carbidopa/levodopa 50/200 at bedtime.  She is also on Azilect.  Overall, she has been doing well.  She was in the emergency room at the beginning of August for lightheadedness and I have reviewed those records.  Work-up was fairly unremarkable and symptoms had resolved.  She was discharged home.  Pt said that she woke up that morning and felt nauseated and diaphoretic and she looked up the symptoms and it said could be cardiac and that is why went to ER.  Sx's only lasted 30 min.   One fall since our last visit.  got her arm caught in the door and lost balance and fell.  Did not get hurt.  No hallucinations.  She did see dermatology last week and had some places frozen off.  Travelled to [place] since last visit and did really well.  04/13/2018 update: Pt seen in f/u for PD.  On carbidopa/levodopa 25/100 tid and carbidopa/levodopa 50/200 q hs.  On azilect.  Feels more shuffling.  Had one fall on a sidewalk because she was turning to talk to someone  and fell.  She fell on the wrist but no fx when she went to get it checked out.  Told possible hairline fx in the patella by Dr. Alvan Dame.  This was just 2 weeks ago.  Not exercising much since.   No lightheadedness or near syncope.  She continues to follow regularly with cardiology, last being seen on December 12.  I have reviewed those records.  PREVIOUS MEDICATIONS: Azilect and artane  ALLERGIES:   Allergies  Allergen Reactions  . Statins     Leg weakness - but tolerating low dose Crestor every other day    CURRENT MEDICATIONS:  Current Outpatient Medications on File Prior to Visit  Medication Sig Dispense Refill  . CALCIUM PO Take 1 tablet by  mouth at bedtime.     . Calcium Polycarbophil (FIBERCON PO) Take 2 tablets by mouth every morning.     . carbidopa-levodopa (SINEMET CR) 50-200 MG tablet TAKE 1 TABLET BY MOUTH EVERYDAY AT BEDTIME 90 tablet 1  . clonazePAM (KLONOPIN) 0.5 MG tablet Take 1 tablet (0.5 mg total) by mouth at bedtime. 90 tablet 1  . Coenzyme Q10 (CO Q 10 PO) Take 1 tablet by mouth every morning.     Mariane Baumgarten Sodium (COLACE PO) Take 1 capsule by mouth 2 (two) times daily as needed (constipation).     [name] ELIQUIS 5 MG TABS tablet Take 5 mg by mouth 2 (two) times daily.     [name] ezetimibe (ZETIA) 10 MG tablet Take 10 mg by mouth every other day. Taking the same day as the crestor    . furosemide (LASIX) 40 MG tablet TAKE 1 TABLET TWICE A DAY 180 tablet 3  . levothyroxine (SYNTHROID, LEVOTHROID) 100 MCG tablet Take 50-100 mcg by mouth See admin instructions. Take 100 mg by mouth on Tuesday, Thursday, Saturday and Sunday. Take 50 mg by mouth on Monday, Wednesday and Friday  2  . MAGNESIUM PO Take 1 tablet by mouth at bedtime.     . metoprolol succinate (TOPROL-XL) 25 MG 24 hr tablet TAKE 1 TABLET BY MOUTH EVERY DAY 90 tablet 2  . multivitamin-lutein (OCUVITE-LUTEIN) CAPS capsule Take 1 capsule by mouth daily.    [name] OVER THE COUNTER MEDICATION CBD oil    . polyethylene glycol  (MIRALAX / GLYCOLAX) packet Take 17 g by mouth 2 (two) times daily. (Patient taking differently: Take 17 g by mouth daily as needed. ) 14 each 0  . potassium chloride (K-DUR,KLOR-CON) 10 MEQ tablet Take 10 mEq by mouth every morning.    . raloxifene (EVISTA) 60 MG tablet Take 60 mg by mouth daily.  3  . rasagiline (AZILECT) 1 MG TABS tablet TAKE 1 TABLET (1 MG TOTAL) BY MOUTH DAILY. 90 tablet 1  . rosuvastatin (CRESTOR) 5 MG tablet Take 2.5 mg by mouth every other day.    . valACYclovir (VALTREX) 1000 MG tablet Take 1,000 mg by mouth 2 (two) times daily as needed (fever blisters).    [name] VITAMIN D, ERGOCALCIFEROL, PO Take 1,000 Units by mouth daily.      No current facility-administered medications on file prior to visit.     PAST MEDICAL HISTORY:   Past Medical History:  Diagnosis Date  . Anemia   . Arthritis    thumbs  . Atrial tachycardia ([place])   . Atypical atrial flutter ([place])   . Cataract    bilateral  . Chronic anticoagulation    on Xarelto  . Chronic diastolic CHF (congestive heart failure) ([facility])    Echocardiogram (11/24/12): Mild LVH, EF 60%.  . Dysrhythmia   . GERD (gastroesophageal reflux disease)    hx of no meds  . Heart murmur    slight  . Hyperlipidemia    a. Hx of leg weakness while on statin. under control  . Hypertension   . Hypothyroidism   . Leg fracture Dec 21,2011  . Osteoporosis 07/2007  . Parkinson's disease ([place]) 2011  . Persistent atrial fibrillation    s/p atrial fib ablation 11-11-11.   [name] PONV (postoperative nausea and vomiting)   . Presence of permanent cardiac pacemaker    permanent Medtronic  . Shortness of breath    "when walking at incline or stairs"  . Sinus brady-tachy syndrome ([place])   .  Squamous carcinoma summer 2015   back of left thigh  . Wrist fracture    right    PAST SURGICAL HISTORY:   Past Surgical History:  Procedure Laterality Date  . ATRIAL FIBRILLATION ABLATION N/A 11/11/2011   Procedure: ATRIAL FIBRILLATION ABLATION;   Surgeon: Thompson Grayer, MD;  Location: [facility] CATH LAB;  Service: Cardiovascular;  Laterality: N/A;  . BREAST BIOPSY  1998  . BREAST SURGERY     benign cyst removed  . CARDIAC ELECTROPHYSIOLOGY STUDY AND ABLATION  5/14, 7/14   convergent AF ablation at [facility] and subsequent atypical atrial flutter ablation by Dr Lehman Prom  . CARDIOVASCULAR STRESS TEST  06-06-2008   EF 73%  . CARDIOVERSION  09/09/2011   Procedure: CARDIOVERSION;  Surgeon: Darlin Coco, MD;  Location: [facility] ENDOSCOPY;  Service: Cardiovascular;  Laterality: N/A;  to be done by dr. Mare Ferrari  . CARDIOVERSION  02/18/2012   Procedure: CARDIOVERSION;  Surgeon: Thayer Headings, MD;  Location: [place];  Service: Cardiovascular;  Laterality: N/A;  . CHOLECYSTECTOMY  1995  . FEMUR FRACTURE SURGERY  2011   metal pin in place  . FOREARM SURGERY Left 2010   "opened arm to drain it"  . INSERTION OF MESH N/A 11/24/2013   Procedure: INSERTION OF MESH;  Surgeon: [name] Boston, MD;  Location: [facility] ORS;  Service: General;  Laterality: N/A;  . KNEE SURGERY Left 2001  . Left unicompartmental knee     Dr. Alvan Dame 11/18/16  . PACEMAKER PLACEMENT  10/20/12   MDT Advisa DR implanted by Dr Lehman Prom at [facility]   . PARTIAL KNEE ARTHROPLASTY Left 11/18/2016   Procedure: LEFT UNICOMPARTMENTAL KNEE Medially;  Surgeon: Paralee Cancel, MD;  Location: [facility] ORS;  Service: Orthopedics;  Laterality: Left;  90 mins  . SKIN CANCER DESTRUCTION  summer 2015  . TEE WITHOUT CARDIOVERSION  11/10/2011   Procedure: TRANSESOPHAGEAL ECHOCARDIOGRAM (TEE);  Surgeon: Thayer Headings, MD;  Location: [place];  Service: Cardiovascular;  Laterality: N/A;  . TONSILLECTOMY  as child  . US ECHOCARDIOGRAPHY  03-10-2008   Est EF 55-60%, Dr. Cathie Olden  . VENTRAL HERNIA REPAIR N/A 11/24/2013   Procedure: LAPAROSCOPIC VENTRAL HERNIA;  Surgeon: [name] Boston, MD;  Location: [facility];  Service: General;  Laterality: N/A;  . WRIST SURGERY Right ~2009   metal rod in place    SOCIAL HISTORY:   Social  History   Socioeconomic History  . Marital status: Married    Spouse name: Not on file  . Number of children: Not on file  . Years of education: Not on file  . Highest education level: Not on file  Occupational History  . Not on file  Social Needs  . Financial resource strain: Not on file  . Food insecurity:    Worry: Not on file    Inability: Not on file  . Transportation needs:    Medical: Not on file    Non-medical: Not on file  Tobacco Use  . Smoking status: Never Smoker  . Smokeless tobacco: Never Used  Substance and Sexual Activity  . Alcohol use: No  . Drug use: No  . Sexual activity: Yes    Partners: Male    Birth control/protection: Post-menopausal  Lifestyle  . Physical activity:    Days per week: Not on file    Minutes per session: Not on file  . Stress: Not on file  Relationships  . Social connections:    Talks on phone: Not on file    Gets together: Not on  file    Attends religious service: Not on file    Active member of club or organization: Not on file    Attends meetings of clubs or organizations: Not on file    Relationship status: Not on file  . Intimate partner violence:    Fear of current or ex partner: Not on file    Emotionally abused: Not on file    Physically abused: Not on file    Forced sexual activity: Not on file  Other Topics Concern  . Not on file  Social History Narrative   Lives in [place].  Retired Licensed conveyancer.    FAMILY HISTORY:   Family Status  Relation Name Status  . Mother  Deceased at age 64       AD   . Father  Deceased at age 71       Valve-rheumatic fever as child  . Child  Alive       healthy  . Child  Alive       healthy  . Mat Aunt  (Not Specified)    ROS:  Review of Systems  Constitutional: Negative.   HENT: Negative.   Eyes: Negative.   Respiratory: Negative.   Cardiovascular: Negative.   Gastrointestinal: Negative.   Genitourinary: Negative.   Skin: Negative.     PHYSICAL EXAMINATION:     VITALS:   Vitals:   04/13/18 1454  BP: 120/80  Pulse: 84  SpO2: 98%  Weight: 146 lb (66.2 kg)  Height: 5\' 2"  (1.575 m)    GEN:  The patient appears stated age and is in NAD. HEENT:  Normocephalic, atraumatic.  The mucous membranes are moist. The superficial temporal arteries are without ropiness or tenderness. CV:  irreg Lungs:  CTAB Neck/HEME:  There are no carotid bruits bilaterally.  Neurological examination:  Orientation: The patient is alert and oriented x3. Cranial nerves: There is good facial symmetry. The speech is fluent and clear. Soft palate rises symmetrically and there is no tongue deviation. Hearing is intact to conversational tone. Sensation: Sensation is intact to light touch throughout Motor: Strength is 5/5 in the bilateral upper and lower extremities.   Shoulder shrug is equal and symmetric.  There is no pronator drift.  Movement examination: Tone: There is mild increased tone in the RUE Abnormal movements: There is rare RLE resting tremor Coordination:  There is mild decremation with any form of RAMS, including alternating supination and pronation of the forearm, hand opening and closing, finger taps, heel taps and toe taps on the right. Gait and Station: The patient has no difficulty arising out of a deep-seated chair without the use of the hands. The patient's stride length is normal with good armswing today.    ASSESSMENT/PLAN:  1.  idiopathic tremor predominant Parkinson's disease  -increase carbidopa/levodopa 25/100, 1.5 tablets three times per day.  Risks, benefits, side effects and alternative therapies were discussed.  The opportunity to ask questions was given and they were answered to the best of my ability.  The patient expressed understanding and willingness to follow the outlined treatment protocols.  -continue carbidopa/levodopa 50/200 at bed.  -She will remain on the Azilect.  -safety discussed  -increase exercise when able (once knee  heals) 2.  Insomnia.  -takes klonopin prn. She is not taking it often (1 time per month) as she is sleeping better with melatonin 3.  Follow up is anticipated in the next 5 months, sooner should new neurologic issues arise.  Much greater than 50% of  this visit was spent in counseling and coordinating care.  Total face to face time:  25 min

## 2018-04-13 ENCOUNTER — Ambulatory Visit (INDEPENDENT_AMBULATORY_CARE_PROVIDER_SITE_OTHER): Payer: Medicare Other | Admitting: *Deleted

## 2018-04-13 ENCOUNTER — Encounter: Payer: Self-pay | Admitting: Neurology

## 2018-04-13 ENCOUNTER — Telehealth: Payer: Self-pay | Admitting: Neurology

## 2018-04-13 ENCOUNTER — Ambulatory Visit (INDEPENDENT_AMBULATORY_CARE_PROVIDER_SITE_OTHER): Payer: Medicare Other | Admitting: Neurology

## 2018-04-13 VITALS — BP 120/80 | HR 84 | Ht 62.0 in | Wt 146.0 lb

## 2018-04-13 DIAGNOSIS — G20 Parkinson's disease: Secondary | ICD-10-CM

## 2018-04-13 DIAGNOSIS — I495 Sick sinus syndrome: Secondary | ICD-10-CM

## 2018-04-13 MED ORDER — CARBIDOPA-LEVODOPA 25-100 MG PO TABS: 1.5000 | ORAL_TABLET | Freq: Three times a day (TID) | ORAL | 1 refills | Status: DC

## 2018-04-13 NOTE — Telephone Encounter (Signed)
Ok.  Thank you.  Still do the increase and see how she does

## 2018-04-13 NOTE — Telephone Encounter (Signed)
Patient made aware.

## 2018-04-13 NOTE — Telephone Encounter (Signed)
Patient calling in about carbidopa levodopa that she had not taken it at 1 today. So she had only had the one pill. Just FYI. She didn't know if that would change anything. Thanks!

## 2018-04-13 NOTE — Telephone Encounter (Signed)
FYI

## 2018-04-13 NOTE — Patient Instructions (Addendum)
1.  increase carbidopa/levodopa 25/100, 1.5 tablets three times per day 2.  Continue carbidopa/levodopa 50/200 at bedtime 3.  Continue azilect  The physicians and staff at [facility] are committed to providing excellent care. You may receive a survey requesting feedback about your experience at our office. We strive to receive "very good" responses to the survey questions. If you feel that your experience would prevent you from giving the office a "very good " response, please contact our office to try to remedy the situation. We may be reached at [phone]. Thank you for taking the time out of your busy day to complete the survey.

## 2018-04-14 ENCOUNTER — Telehealth: Payer: Self-pay

## 2018-04-14 NOTE — Telephone Encounter (Signed)
Left message for patient to remind of missed remote transmission.  

## 2018-04-16 ENCOUNTER — Other Ambulatory Visit: Payer: Self-pay | Admitting: Neurology

## 2018-04-16 DIAGNOSIS — M25562 Pain in left knee: Secondary | ICD-10-CM | POA: Diagnosis not present

## 2018-04-16 DIAGNOSIS — S82002D Unspecified fracture of left patella, subsequent encounter for closed fracture with routine healing: Secondary | ICD-10-CM | POA: Diagnosis not present

## 2018-04-16 LAB — CUP PACEART REMOTE DEVICE CHECK
Battery Voltage: 2.96 V
Brady Statistic AS VP Percent: 0 %
Brady Statistic AS VS Percent: 0 %
Brady Statistic RA Percent Paced: 0 %
Brady Statistic RV Percent Paced: 43.37 %
Implantable Lead Implant Date: 20140917
Implantable Pulse Generator Implant Date: 20140917
Lead Channel Impedance Value: 323 Ohm
Lead Channel Impedance Value: 361 Ohm
Lead Channel Pacing Threshold Amplitude: 0.875 V
Lead Channel Pacing Threshold Amplitude: 1 V
Lead Channel Pacing Threshold Pulse Width: 0.4 ms
Lead Channel Pacing Threshold Pulse Width: 0.4 ms
Lead Channel Sensing Intrinsic Amplitude: 0.25 mV
Lead Channel Sensing Intrinsic Amplitude: 0.25 mV
MDC IDC LEAD IMPLANT DT: 20140917
MDC IDC LEAD LOCATION: 753859
MDC IDC LEAD LOCATION: 753860
MDC IDC MSMT BATTERY REMAINING LONGEVITY: 41 mo
MDC IDC MSMT LEADCHNL RV IMPEDANCE VALUE: 380 Ohm
MDC IDC MSMT LEADCHNL RV IMPEDANCE VALUE: 456 Ohm
MDC IDC MSMT LEADCHNL RV SENSING INTR AMPL: 12 mV
MDC IDC MSMT LEADCHNL RV SENSING INTR AMPL: 12 mV
MDC IDC SESS DTM: 20200313132240
MDC IDC SET LEADCHNL RV PACING AMPLITUDE: 2.5 V
MDC IDC SET LEADCHNL RV PACING PULSEWIDTH: 0.4 ms
MDC IDC SET LEADCHNL RV SENSING SENSITIVITY: 0.9 mV
MDC IDC STAT BRADY AP VP PERCENT: 0 %
MDC IDC STAT BRADY AP VS PERCENT: 0 %

## 2018-04-19 DIAGNOSIS — M810 Age-related osteoporosis without current pathological fracture: Secondary | ICD-10-CM | POA: Diagnosis not present

## 2018-04-19 DIAGNOSIS — R7301 Impaired fasting glucose: Secondary | ICD-10-CM | POA: Diagnosis not present

## 2018-04-19 DIAGNOSIS — I10 Essential (primary) hypertension: Secondary | ICD-10-CM | POA: Diagnosis not present

## 2018-04-19 DIAGNOSIS — E038 Other specified hypothyroidism: Secondary | ICD-10-CM | POA: Diagnosis not present

## 2018-04-19 DIAGNOSIS — E7849 Other hyperlipidemia: Secondary | ICD-10-CM | POA: Diagnosis not present

## 2018-04-19 DIAGNOSIS — D649 Anemia, unspecified: Secondary | ICD-10-CM | POA: Diagnosis not present

## 2018-04-21 ENCOUNTER — Encounter: Payer: Self-pay | Admitting: Cardiology

## 2018-04-21 NOTE — Progress Notes (Signed)
Remote pacemaker transmission.   

## 2018-04-26 DIAGNOSIS — I480 Paroxysmal atrial fibrillation: Secondary | ICD-10-CM | POA: Diagnosis not present

## 2018-04-26 DIAGNOSIS — K7460 Unspecified cirrhosis of liver: Secondary | ICD-10-CM | POA: Diagnosis not present

## 2018-04-26 DIAGNOSIS — M25562 Pain in left knee: Secondary | ICD-10-CM | POA: Diagnosis not present

## 2018-04-26 DIAGNOSIS — R82998 Other abnormal findings in urine: Secondary | ICD-10-CM | POA: Diagnosis not present

## 2018-04-26 DIAGNOSIS — E538 Deficiency of other specified B group vitamins: Secondary | ICD-10-CM | POA: Diagnosis not present

## 2018-04-26 DIAGNOSIS — M810 Age-related osteoporosis without current pathological fracture: Secondary | ICD-10-CM | POA: Diagnosis not present

## 2018-04-26 DIAGNOSIS — D6489 Other specified anemias: Secondary | ICD-10-CM | POA: Diagnosis not present

## 2018-04-26 DIAGNOSIS — G20 Parkinson's disease: Secondary | ICD-10-CM | POA: Diagnosis not present

## 2018-04-26 DIAGNOSIS — K7689 Other specified diseases of liver: Secondary | ICD-10-CM | POA: Diagnosis not present

## 2018-04-26 DIAGNOSIS — Z0000 Encounter for general adult medical examination without abnormal findings: Secondary | ICD-10-CM | POA: Diagnosis not present

## 2018-04-26 DIAGNOSIS — I509 Heart failure, unspecified: Secondary | ICD-10-CM | POA: Diagnosis not present

## 2018-04-26 DIAGNOSIS — Z1331 Encounter for screening for depression: Secondary | ICD-10-CM | POA: Diagnosis not present

## 2018-04-26 DIAGNOSIS — Z950 Presence of cardiac pacemaker: Secondary | ICD-10-CM | POA: Diagnosis not present

## 2018-05-06 ENCOUNTER — Ambulatory Visit: Payer: Medicare Other

## 2018-05-18 ENCOUNTER — Other Ambulatory Visit: Payer: Self-pay | Admitting: Neurology

## 2018-06-14 DIAGNOSIS — H26493 Other secondary cataract, bilateral: Secondary | ICD-10-CM | POA: Diagnosis not present

## 2018-06-14 DIAGNOSIS — H5213 Myopia, bilateral: Secondary | ICD-10-CM | POA: Diagnosis not present

## 2018-06-14 DIAGNOSIS — Z961 Presence of intraocular lens: Secondary | ICD-10-CM | POA: Diagnosis not present

## 2018-06-14 DIAGNOSIS — H353132 Nonexudative age-related macular degeneration, bilateral, intermediate dry stage: Secondary | ICD-10-CM | POA: Diagnosis not present

## 2018-06-22 DIAGNOSIS — D649 Anemia, unspecified: Secondary | ICD-10-CM | POA: Diagnosis not present

## 2018-06-22 DIAGNOSIS — E871 Hypo-osmolality and hyponatremia: Secondary | ICD-10-CM | POA: Diagnosis not present

## 2018-06-22 DIAGNOSIS — E538 Deficiency of other specified B group vitamins: Secondary | ICD-10-CM | POA: Diagnosis not present

## 2018-06-22 DIAGNOSIS — D62 Acute posthemorrhagic anemia: Secondary | ICD-10-CM | POA: Diagnosis not present

## 2018-06-23 ENCOUNTER — Ambulatory Visit
Admission: RE | Admit: 2018-06-23 | Discharge: 2018-06-23 | Disposition: A | Discharge status: Home / Self Care | Payer: Medicare Other | Source: Ambulatory Visit | Attending: Internal Medicine | Admitting: Internal Medicine

## 2018-06-23 ENCOUNTER — Other Ambulatory Visit: Payer: Self-pay

## 2018-06-23 DIAGNOSIS — Z1231 Encounter for screening mammogram for malignant neoplasm of breast: Secondary | ICD-10-CM | POA: Diagnosis not present

## 2018-06-24 ENCOUNTER — Telehealth: Payer: Self-pay | Admitting: Oncology

## 2018-06-24 NOTE — Telephone Encounter (Signed)
Received a new hem referral from Dr. Joylene Draft for worsening chronic normocytic anemia. Pt has been cld and scheduled to see Dr. Alen Blew on 6/5 at 2pm.

## 2018-07-09 ENCOUNTER — Inpatient Hospital Stay: Payer: Medicare Other | Attending: Oncology | Admitting: Oncology

## 2018-07-09 ENCOUNTER — Other Ambulatory Visit: Payer: Self-pay

## 2018-07-09 VITALS — BP 132/76 | HR 91 | Temp 99.8°F | Resp 18 | Ht 62.0 in | Wt 145.9 lb

## 2018-07-09 DIAGNOSIS — E611 Iron deficiency: Secondary | ICD-10-CM | POA: Diagnosis not present

## 2018-07-09 DIAGNOSIS — K7460 Unspecified cirrhosis of liver: Secondary | ICD-10-CM | POA: Diagnosis not present

## 2018-07-09 DIAGNOSIS — I509 Heart failure, unspecified: Secondary | ICD-10-CM | POA: Insufficient documentation

## 2018-07-09 DIAGNOSIS — D649 Anemia, unspecified: Secondary | ICD-10-CM | POA: Insufficient documentation

## 2018-07-09 DIAGNOSIS — E538 Deficiency of other specified B group vitamins: Secondary | ICD-10-CM | POA: Insufficient documentation

## 2018-07-09 DIAGNOSIS — Z79899 Other long term (current) drug therapy: Secondary | ICD-10-CM | POA: Insufficient documentation

## 2018-07-09 NOTE — Progress Notes (Addendum)
Reason for the request: Anemia  HPI: I was asked by Dr. Joylene Draft to evaluate [name] [name] for diagnosis of anemia.  She is a 78 year old woman with history of congestive heart failure, cirrhosis of the liver among other comorbid conditions.  She has been diagnosed with worsening anemia for the last 6 months and her recent CBC on May 19 of 2020 showed a hemoglobin of 7.9, hematocrit 25 count of 214.  MCV is 85.4.  Iron studies obtained on Jun 22, 2018 showed borderline iron of 31 with saturation of 8%.  Vitamin B12 level was normal at 1109.  Her ferritin level on April 19, 2018 was 36, vitamin B12 was low at 218 with iron level of 30 and iron saturation of 10%.  She had a normal serum protein electrophoresis at that time.  In December 2019 her hemoglobin was 10.8 which is close to her baseline in 2018.  Her ferritin at that time was 90.8.  She has received a intravenous iron in the past but has been close to a year prior.  She has been intolerant to oral iron with issues with dyspepsia, constipation and indigestion.  She denies any hematochezia, melena and had a GI work-up by Dr. Cristina Gong.  He had a colonoscopy without any source of bleeding identified.  She is mildly symptomatic from her anemia with symptoms of fatigue, tiredness and occasional dizziness.  She denies any shortness of breath at rest but mild dyspnea on exertion.  She does not report any headaches, blurry vision, syncope or seizures. Does not report any fevers, chills or sweats.  Does not report any cough, wheezing or hemoptysis.  Does not report any chest pain, palpitation, orthopnea or leg edema.  Does not report any nausea, vomiting or abdominal pain.  Does not report any constipation or diarrhea.  Does not report any skeletal complaints.    Does not report frequency, urgency or hematuria.  Does not report any skin rashes or lesions. Does not report any heat or cold intolerance.  Does not report any lymphadenopathy or petechiae.  Does not report  any anxiety or depression.  Remaining review of systems is negative.    Past Medical History:  Diagnosis Date  . Anemia   . Arthritis    thumbs  . Atrial tachycardia ([place])   . 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 Cataract    bilateral  . Chronic anticoagulation    on Xarelto  . Chronic diastolic CHF (congestive heart failure) ([facility])    Echocardiogram (11/24/12): Mild LVH, EF 60%.  . Dysrhythmia   . GERD (gastroesophageal reflux disease)    hx of no meds  . Heart murmur    slight  . Hyperlipidemia    a. Hx of leg weakness while on statin. under control  . Hypertension   . Hypothyroidism   . Leg fracture Dec 21,2011  . Osteoporosis 07/2007  . Parkinson's disease ([place]) 2011  . Persistent atrial fibrillation    s/p atrial fib ablation 11-11-11.   [name] PONV (postoperative nausea and vomiting)   . Presence of permanent cardiac pacemaker    permanent [employer]  . Shortness of breath    "when walking at incline or stairs"  . Sinus brady-tachy syndrome ([place])   . Squamous carcinoma summer 2015   back of left thigh  . Wrist fracture    right  :  Past Surgical History:  Procedure Laterality Date  . ATRIAL FIBRILLATION ABLATION N/A 11/11/2011   Procedure: ATRIAL FIBRILLATION ABLATION;  Surgeon:  Thompson Grayer, MD;  Location: [facility] CATH LAB;  Service: Cardiovascular;  Laterality: N/A;  . BREAST BIOPSY  1998  . BREAST SURGERY     benign cyst removed  . CARDIAC ELECTROPHYSIOLOGY STUDY AND ABLATION  5/14, 7/14   convergent AF ablation at [facility] and subsequent atypical atrial flutter ablation by Dr Lehman Prom  . CARDIOVASCULAR STRESS TEST  06-06-2008   EF 73%  . CARDIOVERSION  09/09/2011   Procedure: CARDIOVERSION;  Surgeon: Darlin Coco, MD;  Location: [facility] ENDOSCOPY;  Service: Cardiovascular;  Laterality: N/A;  to be done by dr. Mare Ferrari  . CARDIOVERSION  02/18/2012   Procedure: CARDIOVERSION;  Surgeon: Thayer Headings, MD;  Location: [facility];  Service: Cardiovascular;  Laterality: N/A;  .  CHOLECYSTECTOMY  1995  . FEMUR FRACTURE SURGERY  2011   metal pin in place  . FOREARM SURGERY Left 2010   "opened arm to drain it"  . INSERTION OF MESH N/A 11/24/2013   Procedure: INSERTION OF MESH;  Surgeon: Michael Boston, MD;  Location: [facility] ORS;  Service: General;  Laterality: N/A;  . KNEE SURGERY Left 2001  . Left unicompartmental knee     Dr. Alvan Dame 11/18/16  . PACEMAKER PLACEMENT  10/20/12   MDT Advisa DR implanted by Dr Lehman Prom [facility]   . PARTIAL KNEE ARTHROPLASTY Left 11/18/2016   Procedure: LEFT UNICOMPARTMENTAL KNEE Medially;  Surgeon: Paralee Cancel, MD;  Location: [facility] ORS;  Service: Orthopedics;  Laterality: Left;  90 mins  . SKIN CANCER DESTRUCTION  summer 2015  . TEE WITHOUT CARDIOVERSION  11/10/2011   Procedure: TRANSESOPHAGEAL ECHOCARDIOGRAM (TEE);  Surgeon: Thayer Headings, MD;  Location: [facility];  Service: Cardiovascular;  Laterality: N/A;  . TONSILLECTOMY  as child  . US ECHOCARDIOGRAPHY  03-10-2008   Est EF 55-60%, Dr. Cathie Olden  . VENTRAL HERNIA REPAIR N/A 11/24/2013   Procedure: LAPAROSCOPIC VENTRAL HERNIA;  Surgeon: Michael Boston, MD;  Location: [facility];  Service: General;  Laterality: N/A;  . WRIST SURGERY Right ~2009   metal rod in place  :   Current Outpatient Medications:  .  CALCIUM PO, Take 1 tablet by mouth at bedtime. , Disp: , Rfl:  .  Calcium Polycarbophil (FIBERCON PO), Take 2 tablets by mouth every morning. , Disp: , Rfl:  .  carbidopa-levodopa (SINEMET CR) 50-200 MG tablet, TAKE 1 TABLET BY MOUTH EVERYDAY AT BEDTIME, Disp: 90 tablet, Rfl: 1 .  carbidopa-levodopa (SINEMET IR) 25-100 MG tablet, Take 1.5 tablets by mouth 3 (three) times daily., Disp: 450 tablet, Rfl: 1 .  clonazePAM (KLONOPIN) 0.5 MG tablet, Take 1 tablet (0.5 mg total) by mouth at bedtime., Disp: 90 tablet, Rfl: 1 .  Coenzyme Q10 (CO Q 10 PO), Take 1 tablet by mouth every morning. , Disp: , Rfl:  .  Docusate Sodium (COLACE PO), Take 1 capsule by mouth 2 (two) times daily as needed  (constipation). , Disp: , Rfl:  .  ELIQUIS 5 MG TABS tablet, Take 5 mg by mouth 2 (two) times daily. , Disp: , Rfl:  .  ezetimibe (ZETIA) 10 MG tablet, Take 10 mg by mouth every other day. Taking the same day as the crestor, Disp: , Rfl:  .  furosemide (LASIX) 40 MG tablet, TAKE 1 TABLET TWICE A DAY, Disp: 180 tablet, Rfl: 3 .  levothyroxine (SYNTHROID, LEVOTHROID) 100 MCG tablet, Take 50-100 mcg by mouth See admin instructions. Take 100 mg by mouth on Tuesday, Thursday, Saturday and Sunday. Take 50 mg by mouth on Monday, Wednesday and  Friday, Disp: , Rfl: 2 .  MAGNESIUM PO, Take 1 tablet by mouth at bedtime. , Disp: , Rfl:  .  metoprolol succinate (TOPROL-XL) 25 MG 24 hr tablet, TAKE 1 TABLET BY MOUTH EVERY DAY, Disp: 90 tablet, Rfl: 2 .  multivitamin-lutein (OCUVITE-LUTEIN) CAPS capsule, Take 1 capsule by mouth daily., Disp: , Rfl:  .  OVER THE COUNTER MEDICATION, CBD oil, Disp: , Rfl:  .  polyethylene glycol (MIRALAX / GLYCOLAX) packet, Take 17 g by mouth 2 (two) times daily. (Patient taking differently: Take 17 g by mouth daily as needed. ), Disp: 14 each, Rfl: 0 .  potassium chloride (K-DUR,KLOR-CON) 10 MEQ tablet, Take 10 mEq by mouth every morning., Disp: , Rfl:  .  raloxifene (EVISTA) 60 MG tablet, Take 60 mg by mouth daily., Disp: , Rfl: 3 .  rasagiline (AZILECT) 1 MG TABS tablet, TAKE 1 TABLET (1 MG TOTAL) BY MOUTH DAILY., Disp: 90 tablet, Rfl: 1 .  rosuvastatin (CRESTOR) 5 MG tablet, Take 2.5 mg by mouth every other day., Disp: , Rfl:  .  valACYclovir (VALTREX) 1000 MG tablet, Take 1,000 mg by mouth 2 (two) times daily as needed (fever blisters)., Disp: , Rfl:  .  VITAMIN D, ERGOCALCIFEROL, PO, Take 1,000 Units by mouth daily. , Disp: , Rfl: :  Allergies  Allergen Reactions  . Statins     Leg weakness - but tolerating low dose Crestor every other day  :  Family History  Problem Relation Age of Onset  . Alzheimer's disease Mother   . Heart failure Father   . Breast cancer  Maternal Aunt   :  Social History   Socioeconomic History  . Marital status: Married    Spouse name: Not on file  . Number of children: Not on file  . Years of education: Not on file  . Highest education level: Not on file  Occupational History  . Not on file  Social Needs  . Financial resource strain: Not on file  . Food insecurity:    Worry: Not on file    Inability: Not on file  . Transportation needs:    Medical: Not on file    Non-medical: Not on file  Tobacco Use  . Smoking status: Never Smoker  . Smokeless tobacco: Never Used  Substance and Sexual Activity  . Alcohol use: No  . Drug use: No  . Sexual activity: Yes    Partners: Male    Birth control/protection: Post-menopausal  Lifestyle  . Physical activity:    Days per week: Not on file    Minutes per session: Not on file  . Stress: Not on file  Relationships  . Social connections:    Talks on phone: Not on file    Gets together: Not on file    Attends religious service: Not on file    Active member of club or organization: Not on file    Attends meetings of clubs or organizations: Not on file    Relationship status: Not on file  . Intimate partner violence:    Fear of current or ex partner: Not on file    Emotionally abused: Not on file    Physically abused: Not on file    Forced sexual activity: Not on file  Other Topics Concern  . Not on file  Social History Narrative   Lives in [place].  Retired Licensed conveyancer.  :  Pertinent items are noted in HPI.  Exam:  General appearance: alert and cooperative appeared without  distress. Head: atraumatic without any abnormalities. Eyes: conjunctivae/corneas clear. PERRL.  Sclera anicteric. Throat: lips, mucosa, and tongue normal; without oral thrush or ulcers. Resp: clear to auscultation bilaterally without rhonchi, wheezes or dullness to percussion. Cardio: regular rate and rhythm, S1, S2 normal, no murmur, click, rub or gallop GI: soft, non-tender; bowel  sounds normal; no masses,  no organomegaly Skin: Skin color, texture, turgor normal. No rashes or lesions Lymph nodes: Cervical, supraclavicular, and axillary nodes normal. Neurologic: Grossly normal without any motor, sensory or deep tendon reflexes. Musculoskeletal: No joint deformity or effusion.  CBC    Component Value Date/Time   WBC 5.9 11/10/2016 1023   RBC 3.69 (L) 11/10/2016 1023   HGB 10.9 (L) 11/10/2016 1023   HCT 32.2 (L) 11/10/2016 1023   PLT 187 11/10/2016 1023   MCV 87.3 11/10/2016 1023   MCH 29.5 11/10/2016 1023   MCHC 33.9 11/10/2016 1023   RDW 18.0 (H) 11/10/2016 1023   LYMPHSABS 1.3 05/18/2012 0740   MONOABS 0.5 05/18/2012 0740   EOSABS 0.1 05/18/2012 0740   BASOSABS 0.0 05/18/2012 0740     Chemistry      Component Value Date/Time   NA 139 11/10/2016 1023   K 3.8 11/10/2016 1023   CL 99 (L) 11/10/2016 1023   CO2 32 11/10/2016 1023   BUN 19 11/10/2016 1023   CREATININE 0.79 11/10/2016 1023      Component Value Date/Time   CALCIUM 9.5 11/10/2016 1023   ALKPHOS 73 11/24/2012 0055   AST 32 11/24/2012 0055   ALT 21 11/24/2012 0055   BILITOT 1.4 (H) 11/24/2012 0055       Mm 3d Screen Breast Bilateral  Result Date: 06/23/2018 CLINICAL DATA:  Screening. EXAM: DIGITAL SCREENING BILATERAL MAMMOGRAM WITH TOMO AND CAD COMPARISON:  Previous exam(s). ACR Breast Density Category b: There are scattered areas of fibroglandular density. FINDINGS: There are no findings suspicious for malignancy. Images were processed with CAD. IMPRESSION: No mammographic evidence of malignancy. A result letter of this screening mammogram will be mailed directly to the patient. RECOMMENDATION: Screening mammogram in one year. (Code:SM-B-01Y) BI-RADS CATEGORY  1: Negative. Electronically Signed   By: Lajean Manes M.D.   On: 06/23/2018 14:24    Assessment and Plan:   78 year old woman with the following:  1.  Anemia that is normocytic and normochromic.  This was detected as far back  as 2018 with a hemoglobin of 10.8 and was around the same level in December 2019 and has declined further since that time.  He has element of iron deficiency as well as B12 deficiency.   The differential diagnosis was reviewed today and appears that her anemia is multifactorial.  She has a large component of chronic iron deficiency likely related to microscopic blood loss related to cirrhosis of the liver and chronic anticoagulation.  B12 deficiency could also be component in addition to anemia related to cirrhosis of the liver and marrow suppression related to it.  From a management standpoint we will start with replacing her iron fully at this time.  Risks and benefits of Feraheme infusion was reviewed.  These complications that include arthralgias, myalgias and rarely anaphylaxis.  She will receive a total of 1000 mg in the near future.  After replacing her iron fully, if her hemoglobin continues to be less than adequate, growth factor support may be needed in the form of Aranesp or Procrit for anemia of chronic disease.  Bone marrow biopsy could be also consideration to rule out myelodysplasia  and other bone marrow etiologies.  This plan was discussed with her as well as her husband via phone and they are agreeable to proceed at this time.  2.  Follow-up: We will be in 3 months for repeat evaluation after IV iron infusion in the immediate future.  40  minutes was spent with the patient face-to-face today.  More than 50% of time was dedicated to patient counseling, education and coordination of the patient's multifaceted care.   Thank you for the referral. A copy of this consult has been forwarded to the requesting physician.

## 2018-07-12 ENCOUNTER — Telehealth: Payer: Self-pay | Admitting: Oncology

## 2018-07-12 NOTE — Telephone Encounter (Signed)
Left a message regarding schedule

## 2018-07-13 ENCOUNTER — Ambulatory Visit (INDEPENDENT_AMBULATORY_CARE_PROVIDER_SITE_OTHER): Payer: Medicare Other | Admitting: *Deleted

## 2018-07-13 DIAGNOSIS — I495 Sick sinus syndrome: Secondary | ICD-10-CM | POA: Diagnosis not present

## 2018-07-13 DIAGNOSIS — I4819 Other persistent atrial fibrillation: Secondary | ICD-10-CM

## 2018-07-14 ENCOUNTER — Telehealth: Payer: Self-pay

## 2018-07-14 NOTE — Telephone Encounter (Signed)
Spoke with patient to remind of missed remote transmission 

## 2018-07-15 ENCOUNTER — Other Ambulatory Visit: Payer: Self-pay | Admitting: Gastroenterology

## 2018-07-15 DIAGNOSIS — K7460 Unspecified cirrhosis of liver: Secondary | ICD-10-CM

## 2018-07-15 LAB — CUP PACEART REMOTE DEVICE CHECK
Battery Remaining Longevity: 38 mo
Battery Voltage: 2.95 V
Brady Statistic AP VP Percent: 0 %
Brady Statistic AP VS Percent: 0 %
Brady Statistic AS VP Percent: 0 %
Brady Statistic AS VS Percent: 0 %
Brady Statistic RA Percent Paced: 0 %
Brady Statistic RV Percent Paced: 45.83 %
Date Time Interrogation Session: 20200611034724
Implantable Lead Implant Date: 20140917
Implantable Lead Implant Date: 20140917
Implantable Lead Location: 753859
Implantable Lead Location: 753860
Implantable Pulse Generator Implant Date: 20140917
Lead Channel Impedance Value: 323 Ohm
Lead Channel Impedance Value: 342 Ohm
Lead Channel Impedance Value: 361 Ohm
Lead Channel Impedance Value: 437 Ohm
Lead Channel Pacing Threshold Amplitude: 0.875 V
Lead Channel Pacing Threshold Amplitude: 1 V
Lead Channel Pacing Threshold Pulse Width: 0.4 ms
Lead Channel Pacing Threshold Pulse Width: 0.4 ms
Lead Channel Sensing Intrinsic Amplitude: 0.25 mV
Lead Channel Sensing Intrinsic Amplitude: 0.25 mV
Lead Channel Sensing Intrinsic Amplitude: 13.875 mV
Lead Channel Sensing Intrinsic Amplitude: 13.875 mV
Lead Channel Setting Pacing Amplitude: 2.5 V
Lead Channel Setting Pacing Pulse Width: 0.4 ms
Lead Channel Setting Sensing Sensitivity: 0.9 mV

## 2018-07-16 ENCOUNTER — Other Ambulatory Visit: Payer: Self-pay

## 2018-07-16 ENCOUNTER — Inpatient Hospital Stay: Payer: Medicare Other

## 2018-07-16 VITALS — BP 118/72 | HR 62 | Temp 97.8°F | Resp 16

## 2018-07-16 DIAGNOSIS — Z79899 Other long term (current) drug therapy: Secondary | ICD-10-CM | POA: Diagnosis not present

## 2018-07-16 DIAGNOSIS — D649 Anemia, unspecified: Secondary | ICD-10-CM | POA: Diagnosis not present

## 2018-07-16 DIAGNOSIS — K7460 Unspecified cirrhosis of liver: Secondary | ICD-10-CM | POA: Diagnosis not present

## 2018-07-16 DIAGNOSIS — I509 Heart failure, unspecified: Secondary | ICD-10-CM | POA: Diagnosis not present

## 2018-07-16 DIAGNOSIS — E538 Deficiency of other specified B group vitamins: Secondary | ICD-10-CM | POA: Diagnosis not present

## 2018-07-16 LAB — CBC WITH DIFFERENTIAL (CANCER CENTER ONLY)
Abs Immature Granulocytes: 0.02 10*3/uL (ref 0.00–0.07)
Basophils Absolute: 0 10*3/uL (ref 0.0–0.1)
Basophils Relative: 0 %
Eosinophils Absolute: 0.1 10*3/uL (ref 0.0–0.5)
Eosinophils Relative: 1 %
HCT: 25.6 % — ABNORMAL LOW (ref 36.0–46.0)
Hemoglobin: 7.8 g/dL — ABNORMAL LOW (ref 12.0–15.0)
Immature Granulocytes: 0 %
Lymphocytes Relative: 20 %
Lymphs Abs: 1.4 10*3/uL (ref 0.7–4.0)
MCH: 25.3 pg — ABNORMAL LOW (ref 26.0–34.0)
MCHC: 30.5 g/dL (ref 30.0–36.0)
MCV: 83.1 fL (ref 80.0–100.0)
Monocytes Absolute: 0.8 10*3/uL (ref 0.1–1.0)
Monocytes Relative: 10 %
Neutro Abs: 4.9 10*3/uL (ref 1.7–7.7)
Neutrophils Relative %: 69 %
Platelet Count: 198 10*3/uL (ref 150–400)
RBC: 3.08 MIL/uL — ABNORMAL LOW (ref 3.87–5.11)
RDW: 15.1 % (ref 11.5–15.5)
WBC Count: 7.2 10*3/uL (ref 4.0–10.5)
nRBC: 0 % (ref 0.0–0.2)

## 2018-07-16 LAB — FERRITIN: Ferritin: 16 ng/mL (ref 11–307)

## 2018-07-16 LAB — IRON AND TIBC
Iron: 23 ug/dL — ABNORMAL LOW (ref 41–142)
Saturation Ratios: 5 % — ABNORMAL LOW (ref 21–57)
TIBC: 419 ug/dL (ref 236–444)
UIBC: 396 ug/dL — ABNORMAL HIGH (ref 120–384)

## 2018-07-16 MED ORDER — SODIUM CHLORIDE 0.9 % IV SOLN
510.0000 mg | Freq: Once | INTRAVENOUS | Status: AC
  Administered 2018-07-16: 510 mg via INTRAVENOUS
  Filled 2018-07-16: qty 17

## 2018-07-16 MED ORDER — SODIUM CHLORIDE 0.9 % IV SOLN
Freq: Once | INTRAVENOUS | Status: AC
  Administered 2018-07-16: 14:00:00 via INTRAVENOUS
  Filled 2018-07-16: qty 250

## 2018-07-16 NOTE — Patient Instructions (Signed)

## 2018-07-22 NOTE — Progress Notes (Signed)
Remote pacemaker transmission.   

## 2018-07-27 ENCOUNTER — Ambulatory Visit
Admission: RE | Admit: 2018-07-27 | Discharge: 2018-07-27 | Disposition: A | Discharge status: Home / Self Care | Payer: Medicare Other | Source: Ambulatory Visit | Attending: Gastroenterology | Admitting: Gastroenterology

## 2018-07-27 ENCOUNTER — Inpatient Hospital Stay: Payer: Medicare Other

## 2018-07-27 ENCOUNTER — Other Ambulatory Visit: Payer: Self-pay

## 2018-07-27 ENCOUNTER — Other Ambulatory Visit: Payer: Medicare Other

## 2018-07-27 VITALS — BP 124/61 | HR 71 | Temp 98.1°F | Resp 16

## 2018-07-27 DIAGNOSIS — D649 Anemia, unspecified: Secondary | ICD-10-CM | POA: Diagnosis not present

## 2018-07-27 DIAGNOSIS — C220 Liver cell carcinoma: Secondary | ICD-10-CM | POA: Diagnosis not present

## 2018-07-27 DIAGNOSIS — E538 Deficiency of other specified B group vitamins: Secondary | ICD-10-CM | POA: Diagnosis not present

## 2018-07-27 DIAGNOSIS — K7460 Unspecified cirrhosis of liver: Secondary | ICD-10-CM

## 2018-07-27 DIAGNOSIS — I509 Heart failure, unspecified: Secondary | ICD-10-CM | POA: Diagnosis not present

## 2018-07-27 DIAGNOSIS — Z79899 Other long term (current) drug therapy: Secondary | ICD-10-CM | POA: Diagnosis not present

## 2018-07-27 MED ORDER — SODIUM CHLORIDE 0.9 % IV SOLN
Freq: Once | INTRAVENOUS | Status: AC
  Administered 2018-07-27: 15:00:00 via INTRAVENOUS
  Filled 2018-07-27: qty 250

## 2018-07-27 MED ORDER — SODIUM CHLORIDE 0.9 % IV SOLN
510.0000 mg | Freq: Once | INTRAVENOUS | Status: AC
  Administered 2018-07-27: 510 mg via INTRAVENOUS
  Filled 2018-07-27: qty 17

## 2018-07-27 NOTE — Patient Instructions (Signed)
Ferumoxytol injection What is this medicine? FERUMOXYTOL is an iron complex. Iron is used to make healthy red blood cells, which carry oxygen and nutrients throughout the body. This medicine is used to treat iron deficiency anemia. This medicine may be used for other purposes; ask your health care provider or pharmacist if you have questions. COMMON BRAND NAME(S): Feraheme What should I tell my health care provider before I take this medicine? They need to know if you have any of these conditions: -anemia not caused by low iron levels -high levels of iron in the blood -magnetic resonance imaging (MRI) test scheduled -an unusual or allergic reaction to iron, other medicines, foods, dyes, or preservatives -pregnant or trying to get pregnant -breast-feeding How should I use this medicine? This medicine is for injection into a vein. It is given by a health care professional in a hospital or clinic setting. Talk to your pediatrician regarding the use of this medicine in children. Special care may be needed. Overdosage: If you think you have taken too much of this medicine contact a poison control center or emergency room at once. NOTE: This medicine is only for you. Do not share this medicine with others. What if I miss a dose? It is important not to miss your dose. Call your doctor or health care professional if you are unable to keep an appointment. What may interact with this medicine? This medicine may interact with the following medications: -other iron products This list may not describe all possible interactions. Give your health care provider a list of all the medicines, herbs, non-prescription drugs, or dietary supplements you use. Also tell them if you smoke, drink alcohol, or use illegal drugs. Some items may interact with your medicine. What should I watch for while using this medicine? Visit your doctor or healthcare professional regularly. Tell your doctor or healthcare professional  if your symptoms do not start to get better or if they get worse. You may need blood work done while you are taking this medicine. You may need to follow a special diet. Talk to your doctor. Foods that contain iron include: whole grains/cereals, dried fruits, beans, or peas, leafy green vegetables, and organ meats (liver, kidney). What side effects may I notice from receiving this medicine? Side effects that you should report to your doctor or health care professional as soon as possible: -allergic reactions like skin rash, itching or hives, swelling of the face, lips, or tongue -breathing problems -changes in blood pressure -feeling faint or lightheaded, falls -fever or chills -flushing, sweating, or hot feelings -swelling of the ankles or feet Side effects that usually do not require medical attention (report to your doctor or health care professional if they continue or are bothersome): -diarrhea -headache -nausea, vomiting -stomach pain This list may not describe all possible side effects. Call your doctor for medical advice about side effects. You may report side effects to FDA at 1-800-FDA-1088. Where should I keep my medicine? This drug is given in a hospital or clinic and will not be stored at home. NOTE: This sheet is a summary. It may not cover all possible information. If you have questions about this medicine, talk to your doctor, pharmacist, or health care provider.  2019 Elsevier/Gold Standard (2016-03-10 20:21:10)  Coronavirus (COVID-19) Are you at risk?  Are you at risk for the Coronavirus (COVID-19)?  To be considered HIGH RISK for Coronavirus (COVID-19), you have to meet the following criteria:  . Traveled to [place], [place], [place], [place] or   [place]; or in the [place] to [place], [place], [place], or [place]; and have fever, cough, and shortness of breath within the last 2 weeks of travel OR . Been in close contact with a person diagnosed with COVID-19  within the last 2 weeks and have fever, cough, and shortness of breath . IF YOU DO NOT MEET THESE CRITERIA, YOU ARE CONSIDERED LOW RISK FOR COVID-19.  What to do if you are HIGH RISK for COVID-19?  . If you are having a medical emergency, call 911. . Seek medical care right away. Before you go to a doctor's office, urgent care or emergency department, call ahead and tell them about your recent travel, contact with someone diagnosed with COVID-19, and your symptoms. You should receive instructions from your physician's office regarding next steps of care.  . When you arrive at healthcare provider, tell the healthcare staff immediately you have returned from visiting [place], [place], [place], [place] or [place]; or traveled in the [place] to [place], [place], [place], or [place]; in the last two weeks or you have been in close contact with a person diagnosed with COVID-19 in the last 2 weeks.   . Tell the health care staff about your symptoms: fever, cough and shortness of breath. . After you have been seen by a medical provider, you will be either: o Tested for (COVID-19) and discharged home on quarantine except to seek medical care if symptoms worsen, and asked to  - Stay home and avoid contact with others until you get your results (4-5 days)  - Avoid travel on public transportation if possible (such as bus, train, or airplane) or o Sent to the Emergency Department by EMS for evaluation, COVID-19 testing, and possible admission depending on your condition and test results.  What to do if you are LOW RISK for COVID-19?  Reduce your risk of any infection by using the same precautions used for avoiding the common cold or flu:  . Wash your hands often with soap and warm water for at least 20 seconds.  If soap and water are not readily available, use an alcohol-based hand sanitizer with at least 60% alcohol.  . If coughing or sneezing, cover your mouth and nose by coughing or sneezing  into the elbow areas of your shirt or coat, into a tissue or into your sleeve (not your hands). . Avoid shaking hands with others and consider head nods or verbal greetings only. . Avoid touching your eyes, nose, or mouth with unwashed hands.  . Avoid close contact with people who are sick. . Avoid places or events with large numbers of people in one location, like concerts or sporting events. . Carefully consider travel plans you have or are making. . If you are planning any travel outside or inside the [place], visit the CDC's Travelers' Health webpage for the latest health notices. . If you have some symptoms but not all symptoms, continue to monitor at home and seek medical attention if your symptoms worsen. . If you are having a medical emergency, call 911.   ADDITIONAL HEALTHCARE OPTIONS FOR PATIENTS  [facility] / e-Visit: [email or web address]         [facility]: [phone]  [facility]: [phone]                   [facility]: [phone]   

## 2018-07-28 DIAGNOSIS — R140 Abdominal distension (gaseous): Secondary | ICD-10-CM | POA: Diagnosis not present

## 2018-07-28 DIAGNOSIS — Z7901 Long term (current) use of anticoagulants: Secondary | ICD-10-CM | POA: Diagnosis not present

## 2018-07-28 DIAGNOSIS — K740 Hepatic fibrosis: Secondary | ICD-10-CM | POA: Diagnosis not present

## 2018-08-26 ENCOUNTER — Other Ambulatory Visit: Payer: Self-pay | Admitting: Neurology

## 2018-08-26 NOTE — Telephone Encounter (Signed)
Requested Prescriptions   Pending Prescriptions Disp Refills  . rasagiline (AZILECT) 1 MG TABS tablet [Pharmacy Med Name: RASAGILINE MESYLATE 1 MG TAB] 90 tablet 1    Sig: TAKE 1 TABLET BY MOUTH EVERY DAY   Rx last filled:04/16/18 #90 1 refills  Pt last seen:04/13/18  Follow up appt scheduled:10/14/18

## 2018-09-24 DIAGNOSIS — I509 Heart failure, unspecified: Secondary | ICD-10-CM | POA: Diagnosis not present

## 2018-09-24 DIAGNOSIS — G20 Parkinson's disease: Secondary | ICD-10-CM | POA: Diagnosis not present

## 2018-09-24 DIAGNOSIS — Z950 Presence of cardiac pacemaker: Secondary | ICD-10-CM | POA: Diagnosis not present

## 2018-09-24 DIAGNOSIS — D649 Anemia, unspecified: Secondary | ICD-10-CM | POA: Diagnosis not present

## 2018-09-24 DIAGNOSIS — M810 Age-related osteoporosis without current pathological fracture: Secondary | ICD-10-CM | POA: Diagnosis not present

## 2018-09-24 DIAGNOSIS — K7460 Unspecified cirrhosis of liver: Secondary | ICD-10-CM | POA: Diagnosis not present

## 2018-09-24 DIAGNOSIS — J90 Pleural effusion, not elsewhere classified: Secondary | ICD-10-CM | POA: Diagnosis not present

## 2018-09-24 DIAGNOSIS — K7689 Other specified diseases of liver: Secondary | ICD-10-CM | POA: Diagnosis not present

## 2018-09-24 DIAGNOSIS — E538 Deficiency of other specified B group vitamins: Secondary | ICD-10-CM | POA: Diagnosis not present

## 2018-09-24 DIAGNOSIS — E039 Hypothyroidism, unspecified: Secondary | ICD-10-CM | POA: Diagnosis not present

## 2018-09-24 DIAGNOSIS — I480 Paroxysmal atrial fibrillation: Secondary | ICD-10-CM | POA: Diagnosis not present

## 2018-10-05 NOTE — Progress Notes (Signed)
Virtual Visit via Video Note The purpose of this virtual visit is to provide medical care while limiting exposure to the novel coronavirus.    Consent was obtained for video visit:  Yes.   Answered questions that patient had about telehealth interaction:  Yes.   I discussed the limitations, risks, security and privacy concerns of performing an evaluation and management service by telemedicine. I also discussed with the patient that there may be a patient responsible charge related to this service. The patient expressed understanding and agreed to proceed.  Pt location: Home Physician Location: home Name of referring provider:  Crist Infante, MD I connected with [name] [name] at patients initiation/request on 10/06/2018 at 10:15 AM EDT by video enabled telemedicine application and verified that I am speaking with the correct person using two identifiers. Pt MRN:  [id number] Pt DOB:  Dec 15, 1940 Video Participants:  [name];     History of Present Illness:  Patient seen today in follow-up for Parkinson's disease.  She is currently on carbidopa/levodopa 25/100, which was increased last visit to 1.5 tablets 3 times per day.  She is also on carbidopa/levodopa 50/200 at bedtime as well as Azilect.  She feels that she is doing better with the increased dosage.  Pt denies falls.  Pt denies lightheadedness, near syncope.  No hallucinations.  Mood has been good.  Biggest complaint is insomnia.  States that she really is not using clonazepam very often.  She does request a refill.  States that she has no side effects with the clonazepam and does not feel hung over with that, and it really does help, but she was discouraged from using it by another physician and she worries about that.  Medical records have been reviewed since our last visit, including her visit with oncology for anemia.  This was felt multifactorial, due to microscopic blood loss related to cirrhosis of the liver and B12  deficiency.  She is getting iron replacement.  "that made all the differences in the world."  Prior medications: Azilect, trihexyphenidyl, carbidopa/levodopa   Current Outpatient Medications on File Prior to Visit  Medication Sig Dispense Refill   CALCIUM PO Take 1 tablet by mouth at bedtime.      Calcium Polycarbophil (FIBERCON PO) Take 2 tablets by mouth every morning.      carbidopa-levodopa (SINEMET CR) 50-200 MG tablet TAKE 1 TABLET BY MOUTH EVERYDAY AT BEDTIME 90 tablet 1   carbidopa-levodopa (SINEMET IR) 25-100 MG tablet Take 1.5 tablets by mouth 3 (three) times daily. 450 tablet 1   clonazePAM (KLONOPIN) 0.5 MG tablet Take 1 tablet (0.5 mg total) by mouth at bedtime. 90 tablet 1   Coenzyme Q10 (CO Q 10 PO) Take 1 tablet by mouth every morning.      Docusate Sodium (COLACE PO) Take 1 capsule by mouth 2 (two) times daily as needed (constipation).      ELIQUIS 5 MG TABS tablet Take 5 mg by mouth 2 (two) times daily.      ezetimibe (ZETIA) 10 MG tablet Take 10 mg by mouth every other day. Taking the same day as the crestor     furosemide (LASIX) 40 MG tablet TAKE 1 TABLET TWICE A DAY 180 tablet 3   levothyroxine (SYNTHROID, LEVOTHROID) 100 MCG tablet Take 50-100 mcg by mouth See admin instructions. Take 100 mg by mouth on Tuesday, Thursday, Saturday and Sunday. Take 50 mg by mouth on Monday, Wednesday and Friday  2   MAGNESIUM PO Take 1  tablet by mouth at bedtime.      metoprolol succinate (TOPROL-XL) 25 MG 24 hr tablet TAKE 1 TABLET BY MOUTH EVERY DAY 90 tablet 2   multivitamin-lutein (OCUVITE-LUTEIN) CAPS capsule Take 1 capsule by mouth daily.     OVER THE COUNTER MEDICATION CBD oil     polyethylene glycol (MIRALAX / GLYCOLAX) packet Take 17 g by mouth 2 (two) times daily. (Patient taking differently: Take 17 g by mouth daily as needed. ) 14 each 0   potassium chloride (K-DUR,KLOR-CON) 10 MEQ tablet Take 10 mEq by mouth every morning.     raloxifene (EVISTA) 60 MG  tablet Take 60 mg by mouth daily.  3   rasagiline (AZILECT) 1 MG TABS tablet TAKE 1 TABLET BY MOUTH EVERY DAY 90 tablet 1   rosuvastatin (CRESTOR) 5 MG tablet Take 2.5 mg by mouth every other day.     valACYclovir (VALTREX) 1000 MG tablet Take 1,000 mg by mouth 2 (two) times daily as needed (fever blisters).     VITAMIN D, ERGOCALCIFEROL, PO Take 1,000 Units by mouth daily.      CREON 36000 units CPEP capsule TAKE 1 2 CAPSULES 3 TIMES A DAY WITH MEALS     cyanocobalamin (,VITAMIN B-12,) 1000 MCG/ML injection INJECT 1ML INTRAMUSCULARLY IN THE DELTOID ONCE A MONTH (ALTERNATING DELT. EACH MONTH)     No current facility-administered medications on file prior to visit.      Observations/Objective:   There were no vitals filed for this visit. GEN:  The patient appears stated age and is in NAD.  Neurological examination:  Orientation: The patient is alert and oriented x3. Cranial nerves: There is good facial symmetry. There is mild facial hypomimia.  The speech is fluent and clear. Soft palate rises symmetrically and there is no tongue deviation. Hearing is intact to conversational tone. Motor: Strength is at least antigravity x 4.   Shoulder shrug is equal and symmetric.  There is no pronator drift.  Movement examination: Tone: unable Abnormal movements: she has head dyskinesia Coordination:  There is no decremation with RAM's, with hand opening and closing, finger taps, or the action of "turning in a light bulb." Gait and Station: The patient is in a very confined space, but what I can see, her walker looks good.    Assessment and Plan:   1 Parkinson's disease,  - take carbidopa/levodopa 25/100, 1.5 tablets 3 times per day.  -Take carbidopa/levodopa 50/200 at bedtime  -Continue Azilect 2.  Insomnia  -Continue melatonin  -She and I discussed various options.  She is having worsening insomnia.  We discussed taking low-dose clonazepam, 0.25 mg, regularly (right now not using it  often).  We discussed its potentially addictive nature, but when she uses it currently, she does not feel hung over.  We discussed its risks, benefits, and side effects.  We also discussed using mirtazapine instead, which has been studied in the Parkinson's population and seems quite effective in terms of helping mood as well as helping insomnia.  Ultimately, she stated that she feels comfortable with the clonazepam, and notes that she has not had side effects, and would like to stick with that.  She will let me know how she does.  3.  Anemia  -Following with hematology and receiving IV iron.  Feels much better after the infusions.  -Felt multifactorial due to cirrhosis of the liver and B12 deficiency.  Follow Up Instructions:  5 months  -I discussed the assessment and treatment plan with the  patient. The patient was provided an opportunity to ask questions and all were answered. The patient agreed with the plan and demonstrated an understanding of the instructions.   The patient was advised to call back or seek an in-person evaluation if the symptoms worsen or if the condition fails to improve as anticipated.    Total Time spent in visit with the patient was:  25 min, of which more than 50% of the time was spent in counseling .   Pt understands and agrees with the plan of care outlined.     Alonza Bogus, DO

## 2018-10-06 ENCOUNTER — Other Ambulatory Visit: Payer: Self-pay

## 2018-10-06 ENCOUNTER — Encounter: Payer: Self-pay | Admitting: Neurology

## 2018-10-06 ENCOUNTER — Telehealth (INDEPENDENT_AMBULATORY_CARE_PROVIDER_SITE_OTHER): Payer: Medicare Other | Admitting: Neurology

## 2018-10-06 VITALS — Ht 62.0 in | Wt 145.0 lb

## 2018-10-06 DIAGNOSIS — G20 Parkinson's disease: Secondary | ICD-10-CM | POA: Diagnosis not present

## 2018-10-06 DIAGNOSIS — D649 Anemia, unspecified: Secondary | ICD-10-CM | POA: Diagnosis not present

## 2018-10-06 DIAGNOSIS — G4700 Insomnia, unspecified: Secondary | ICD-10-CM | POA: Diagnosis not present

## 2018-10-06 MED ORDER — CLONAZEPAM 0.5 MG PO TABS: 0.2500 mg | ORAL_TABLET | Freq: Every day | ORAL | 1 refills | Status: DC

## 2018-10-09 ENCOUNTER — Other Ambulatory Visit: Payer: Self-pay | Admitting: Neurology

## 2018-10-13 ENCOUNTER — Encounter: Payer: Medicare Other | Admitting: *Deleted

## 2018-10-13 DIAGNOSIS — E538 Deficiency of other specified B group vitamins: Secondary | ICD-10-CM | POA: Diagnosis not present

## 2018-10-13 DIAGNOSIS — I10 Essential (primary) hypertension: Secondary | ICD-10-CM | POA: Diagnosis not present

## 2018-10-13 DIAGNOSIS — K7460 Unspecified cirrhosis of liver: Secondary | ICD-10-CM | POA: Diagnosis not present

## 2018-10-13 DIAGNOSIS — E038 Other specified hypothyroidism: Secondary | ICD-10-CM | POA: Diagnosis not present

## 2018-10-13 DIAGNOSIS — D649 Anemia, unspecified: Secondary | ICD-10-CM | POA: Diagnosis not present

## 2018-10-14 ENCOUNTER — Ambulatory Visit: Payer: Medicare Other | Admitting: Neurology

## 2018-10-14 DIAGNOSIS — Z23 Encounter for immunization: Secondary | ICD-10-CM | POA: Diagnosis not present

## 2018-10-22 ENCOUNTER — Telehealth: Payer: Self-pay

## 2018-10-22 NOTE — Telephone Encounter (Signed)
The pt was trying to send a manual transmission with her home monitor but do not think she has enough cell signal to do it. I gave her the number to [place] support to get additional help.

## 2018-10-26 ENCOUNTER — Telehealth: Payer: Self-pay | Admitting: Oncology

## 2018-10-26 NOTE — Telephone Encounter (Signed)
Returned patient's phone call regarding rescheduling 09/23 appointments, left a voicemail.

## 2018-10-27 ENCOUNTER — Inpatient Hospital Stay: Payer: Medicare Other

## 2018-10-27 ENCOUNTER — Inpatient Hospital Stay: Payer: Medicare Other | Admitting: Oncology

## 2018-10-28 ENCOUNTER — Ambulatory Visit (INDEPENDENT_AMBULATORY_CARE_PROVIDER_SITE_OTHER): Payer: Medicare Other | Admitting: *Deleted

## 2018-10-28 DIAGNOSIS — I495 Sick sinus syndrome: Secondary | ICD-10-CM

## 2018-10-28 LAB — CUP PACEART REMOTE DEVICE CHECK
Battery Remaining Longevity: 36 mo
Battery Voltage: 2.94 V
Brady Statistic AP VP Percent: 0 %
Brady Statistic AP VS Percent: 0 %
Brady Statistic AS VP Percent: 0 %
Brady Statistic AS VS Percent: 0 %
Brady Statistic RA Percent Paced: 0 %
Brady Statistic RV Percent Paced: 48.69 %
Date Time Interrogation Session: 20200924083227
Implantable Lead Implant Date: 20140917
Implantable Lead Implant Date: 20140917
Implantable Lead Location: 753859
Implantable Lead Location: 753860
Implantable Pulse Generator Implant Date: 20140917
Lead Channel Impedance Value: 323 Ohm
Lead Channel Impedance Value: 342 Ohm
Lead Channel Impedance Value: 361 Ohm
Lead Channel Impedance Value: 418 Ohm
Lead Channel Pacing Threshold Amplitude: 0.875 V
Lead Channel Pacing Threshold Amplitude: 0.875 V
Lead Channel Pacing Threshold Pulse Width: 0.4 ms
Lead Channel Pacing Threshold Pulse Width: 0.4 ms
Lead Channel Sensing Intrinsic Amplitude: 0.25 mV
Lead Channel Sensing Intrinsic Amplitude: 0.25 mV
Lead Channel Sensing Intrinsic Amplitude: 12.5 mV
Lead Channel Sensing Intrinsic Amplitude: 12.5 mV
Lead Channel Setting Pacing Amplitude: 2.5 V
Lead Channel Setting Pacing Pulse Width: 0.4 ms
Lead Channel Setting Sensing Sensitivity: 0.9 mV

## 2018-11-05 ENCOUNTER — Encounter: Payer: Self-pay | Admitting: Cardiology

## 2018-11-05 NOTE — Progress Notes (Signed)
Remote pacemaker transmission.   

## 2018-11-08 ENCOUNTER — Other Ambulatory Visit: Payer: Self-pay

## 2018-11-08 NOTE — Telephone Encounter (Signed)

## 2018-11-09 ENCOUNTER — Telehealth (INDEPENDENT_AMBULATORY_CARE_PROVIDER_SITE_OTHER): Payer: Medicare Other | Admitting: Physician Assistant

## 2018-11-09 ENCOUNTER — Other Ambulatory Visit: Payer: Self-pay

## 2018-11-09 ENCOUNTER — Encounter: Payer: Self-pay | Admitting: Physician Assistant

## 2018-11-09 VITALS — Ht 62.0 in | Wt 142.0 lb

## 2018-11-09 DIAGNOSIS — I4819 Other persistent atrial fibrillation: Secondary | ICD-10-CM | POA: Diagnosis not present

## 2018-11-09 DIAGNOSIS — Z950 Presence of cardiac pacemaker: Secondary | ICD-10-CM

## 2018-11-09 DIAGNOSIS — I10 Essential (primary) hypertension: Secondary | ICD-10-CM

## 2018-11-09 DIAGNOSIS — I4811 Longstanding persistent atrial fibrillation: Secondary | ICD-10-CM

## 2018-11-09 DIAGNOSIS — I495 Sick sinus syndrome: Secondary | ICD-10-CM

## 2018-11-09 DIAGNOSIS — D649 Anemia, unspecified: Secondary | ICD-10-CM

## 2018-11-09 DIAGNOSIS — I5032 Chronic diastolic (congestive) heart failure: Secondary | ICD-10-CM

## 2018-11-09 NOTE — Progress Notes (Signed)
Virtual Visit via Video Note   This visit type was conducted due to national recommendations for restrictions regarding the COVID-19 Pandemic (e.g. social distancing) in an effort to limit this patient's exposure and mitigate transmission in our community.  Due to her co-morbid illnesses, this patient is at least at moderate risk for complications without adequate follow up.  This format is felt to be most appropriate for this patient at this time.  All issues noted in this document were discussed and addressed.  A limited physical exam was performed with this format.  Please refer to the patient's chart for her consent to telehealth for [name] [name].   Date:  11/09/2018   ID:  [name] [name], DOB 1940-06-11, MRN [id number]  Patient Location: Home Provider Location: Home  PCP:  Crist Infante, MD  Cardiologist:  Mertie Moores, MD   Electrophysiologist:  Thompson Grayer, MD   Evaluation Performed:  Follow-Up Visit  Chief Complaint:  AFib, CHF  History of Present Illness:    [name] [name] is a 78 y.o. female with:  Chronic diastolic CHF  Longstanding Persistent Atrial fibrillation   Dofetilide Rx >> breakthrough AFib >> Amiodarone >> breakthrough AF - now rate controlled  Hx of multiple prior ablation procedures; s/p convergence procedure at [facility]  Atrial Tachycardia   SSS s/p pacemaker  Hypertension   Hyperlipidemia   GERD   Parkinson's Dz  Cath 2014: normal coronary arteries  Myoview 02/2018: no ischemia  She was last seen by Dr. Acie Fredrickson in 2016.  She has primarily been followed by Dr. Rayann Heman and Roderic Palau, NP since that time.  She was last seen by Dr. Rayann Heman in 01/2018.  Today, she notes she is doing well.  She has not had chest discomfort.  She notes dyspnea with more moderate to extreme activities.  She has not had orthopnea or lower extremity swelling.  She has not had syncope.  She has not had any bleeding issues.  She is followed by Dr. Alen Blew for  anemia.  She has been treated with iron.  The patient does not have symptoms concerning for COVID-19 infection (fever, chills, cough, or new shortness of breath).    Past Medical History:  Diagnosis Date  . Anemia   . Arthritis    thumbs  . Atrial tachycardia ([place])   . Atypical atrial flutter ([place])   . Cataract    bilateral  . Chronic anticoagulation    on Xarelto  . Chronic diastolic CHF (congestive heart failure) (HCC)    Echocardiogram (11/24/12): Mild LVH, EF 60%.  . Dysrhythmia   . GERD (gastroesophageal reflux disease)    hx of no meds  . Heart murmur    slight  . Hyperlipidemia    a. Hx of leg weakness while on statin. under control  . Hypertension   . Hypothyroidism   . Leg fracture Dec 21,2011  . Osteoporosis 07/2007  . Parkinson's disease ([place]) 2011  . Persistent atrial fibrillation (HCC)    s/p atrial fib ablation 11-11-11.   [name] PONV (postoperative nausea and vomiting)   . Presence of permanent cardiac pacemaker    permanent [employer]  . Shortness of breath    "when walking at incline or stairs"  . Sinus brady-tachy syndrome ([place])   . Squamous carcinoma summer 2015   back of left thigh  . Wrist fracture    right   Past Surgical History:  Procedure Laterality Date  . ATRIAL FIBRILLATION ABLATION N/A 11/11/2011   Procedure: ATRIAL  FIBRILLATION ABLATION;  Surgeon: Thompson Grayer, MD;  Location: [facility] CATH LAB;  Service: Cardiovascular;  Laterality: N/A;  . BREAST BIOPSY  1998  . BREAST SURGERY     benign cyst removed  . CARDIAC ELECTROPHYSIOLOGY STUDY AND ABLATION  5/14, 7/14   convergent AF ablation at [facility] and subsequent atypical atrial flutter ablation by Dr Lehman Prom  . CARDIOVASCULAR STRESS TEST  06-06-2008   EF 73%  . CARDIOVERSION  09/09/2011   Procedure: CARDIOVERSION;  Surgeon: Darlin Coco, MD;  Location: [facility] ENDOSCOPY;  Service: Cardiovascular;  Laterality: N/A;  to be done by dr. Mare Ferrari  . CARDIOVERSION  02/18/2012   Procedure: CARDIOVERSION;   Surgeon: Thayer Headings, MD;  Location: [place];  Service: Cardiovascular;  Laterality: N/A;  . CHOLECYSTECTOMY  1995  . FEMUR FRACTURE SURGERY  2011   metal pin in place  . FOREARM SURGERY Left 2010   "opened arm to drain it"  . INSERTION OF MESH N/A 11/24/2013   Procedure: INSERTION OF MESH;  Surgeon: Michael Boston, MD;  Location: [facility] ORS;  Service: General;  Laterality: N/A;  . KNEE SURGERY Left 2001  . Left unicompartmental knee     Dr. Alvan Dame 11/18/16  . PACEMAKER PLACEMENT  10/20/12   MDT Advisa DR implanted by Dr Lehman Prom [facility]   . PARTIAL KNEE ARTHROPLASTY Left 11/18/2016   Procedure: LEFT UNICOMPARTMENTAL KNEE Medially;  Surgeon: Paralee Cancel, MD;  Location: [facility];  Service: Orthopedics;  Laterality: Left;  90 mins  . SKIN CANCER DESTRUCTION  summer 2015  . TEE WITHOUT CARDIOVERSION  11/10/2011   Procedure: TRANSESOPHAGEAL ECHOCARDIOGRAM (TEE);  Surgeon: Thayer Headings, MD;  Location: [place];  Service: Cardiovascular;  Laterality: N/A;  . TONSILLECTOMY  as child  . US ECHOCARDIOGRAPHY  03-10-2008   Est EF 55-60%, Dr. Cathie Olden  . VENTRAL HERNIA REPAIR N/A 11/24/2013   Procedure: LAPAROSCOPIC VENTRAL HERNIA;  Surgeon: Michael Boston, MD;  Location: [facility];  Service: General;  Laterality: N/A;  . WRIST SURGERY Right ~2009   metal rod in place     Current Meds  Medication Sig  . CALCIUM PO Take 1 tablet by mouth at bedtime.   . Calcium Polycarbophil (FIBERCON PO) Take 2 tablets by mouth every morning.   . carbidopa-levodopa (SINEMET CR) 50-200 MG tablet TAKE 1 TABLET BY MOUTH EVERYDAY AT BEDTIME  . carbidopa-levodopa (SINEMET IR) 25-100 MG tablet Take 1.5 tablets by mouth 3 (three) times daily.  . clonazePAM (KLONOPIN) 0.5 MG tablet Take 0.5 tablets (0.25 mg total) by mouth at bedtime.  . Coenzyme Q10 (CO Q 10 PO) Take 1 tablet by mouth every morning.   [name] CREON 36000 units CPEP capsule TAKE 1 2 CAPSULES 3 TIMES A DAY WITH MEALS  . cyanocobalamin (,VITAMIN B-12,) 1000  MCG/ML injection INJECT 1ML INTRAMUSCULARLY IN THE DELTOID ONCE A MONTH (ALTERNATING DELT. EACH MONTH)  . Docusate Sodium (COLACE PO) Take 1 capsule by mouth 2 (two) times daily as needed (constipation).   [name] ELIQUIS 5 MG TABS tablet Take 5 mg by mouth 2 (two) times daily.   [name] ezetimibe (ZETIA) 10 MG tablet Take 10 mg by mouth every other day. Taking the same day as the crestor  . furosemide (LASIX) 40 MG tablet TAKE 1 TABLET TWICE A DAY  . levothyroxine (SYNTHROID, LEVOTHROID) 100 MCG tablet Take 50-100 mcg by mouth See admin instructions. Take 100 mg by mouth on Tuesday, Thursday, Saturday and Sunday. Take 50 mg by mouth on Monday, Wednesday and Friday  .  MAGNESIUM PO Take 1 tablet by mouth at bedtime.   . metoprolol succinate (TOPROL-XL) 25 MG 24 hr tablet TAKE 1 TABLET BY MOUTH EVERY DAY  . multivitamin-lutein (OCUVITE-LUTEIN) CAPS capsule Take 1 capsule by mouth daily.  [name] OVER THE COUNTER MEDICATION CBD oil  . polyethylene glycol (MIRALAX / GLYCOLAX) packet Take 17 g by mouth 2 (two) times daily. (Patient taking differently: Take 17 g by mouth daily as needed. )  . potassium chloride (K-DUR,KLOR-CON) 10 MEQ tablet Take 10 mEq by mouth every morning.  . raloxifene (EVISTA) 60 MG tablet Take 60 mg by mouth daily.  . rasagiline (AZILECT) 1 MG TABS tablet TAKE 1 TABLET BY MOUTH EVERY DAY  . rosuvastatin (CRESTOR) 5 MG tablet Take 2.5 mg by mouth every other day.  . valACYclovir (VALTREX) 1000 MG tablet Take 1,000 mg by mouth 2 (two) times daily as needed (fever blisters).  [name] VITAMIN D, ERGOCALCIFEROL, PO Take 1,000 Units by mouth daily.      Allergies:   Statins   Social History   Tobacco Use  . Smoking status: Never Smoker  . Smokeless tobacco: Never Used  Substance Use Topics  . Alcohol use: No  . Drug use: No     Family Hx: The patient's family history includes Alzheimer's disease in her mother; Breast cancer in her maternal aunt; Healthy in her child and child; Heart failure in  her father.  ROS:   Please see the history of present illness.    All other systems reviewed and are negative.   Prior CV studies:   The following studies were reviewed today:  Myoview 02/11/2018  Nuclear stress EF: 72%. The left ventricular ejection fraction is hyperdynamic (>65%).  There was no ST segment deviation noted during stress.  This is a low risk study. No evidence of ischemia.  The study is normal.  Echocardiogram 02/11/2018 EF 60-65, normal wall motion, normal diastolic function, trivial AI, mild MR, normal RV SF, mild TR, PASP 40  CPET 04/25/2015 Conclusion: Exercise testing with gas exchange demonstrates a  moderate functional impairment when compared to matched sedentary  norms. At peak exercise, patient appears primarily circulatory  limited with hypotensive response, low-flat O2 pulse and mild  chronotropic incompetence. Pre-exercise spirometry suggests mild  restrictive lung physiology.  Echocardiogram 03/20/2015 EF 55-60, normal wall motion, trivial AI, mild TR, moderate BAE, moderate TR, PASP 49  Cardiac catheterization 05/25/2012 Coronary angiography: Coronary dominance: left Left mainstem: Short, no CAD. Left anterior descending (LAD): No angiographic CAD. Left circumflex (LCx): Large dominant vessel with no angiographic CAD. Right coronary artery (RCA): Small, nondominant vessel with no angiographic CAD.  Left ventriculography: Left ventricular systolic function is normal, LVEF is estimated at 55-60%, there is no significant mitral regurgitation  Final Conclusions:  Normal LV systolic function, no angiographic CAD.    Labs/Other Tests and Data Reviewed:    EKG:  No ECG reviewed.  Recent Labs: 07/16/2018: Hemoglobin 7.8; Platelet Count 198      Wt Readings from Last 3 Encounters:  11/09/18 142 lb (64.4 kg)  10/06/18 145 lb (65.8 kg)  07/09/18 145 lb 14.4 oz (66.2 kg)     Objective:    Vital Signs:  Ht 5\' 2"  (1.575 m)   Wt 142 lb  (64.4 kg)   LMP 02/04/1996   BMI 25.97 kg/m    VITAL SIGNS:  reviewed GEN:  no acute distress EYES:  sclerae anicteric, EOMI - Extraocular Movements Intact RESPIRATORY:  Normal respiratory effort NEURO:  alert  and oriented x 3, no obvious focal deficit PSYCH:  normal affect  ASSESSMENT & PLAN:    1. Longstanding persistent atrial fibrillation ([place]) She has had multiple ablation procedures as well as convergent procedure in the past.  She was previously maintained on dofetilide and amiodarone.  She is now off antiarrhythmic drug therapy and is managed with a rate control strategy.  She does not have symptoms with atrial fibrillation.  She seems to be tolerating anticoagulation well.  She is followed by hematology for chronic anemia.  Most recent creatinine was normal.  2. Chronic diastolic heart failure (HCC) NYHA 2.  Volume status seems to be stable.  Most recent echocardiogram demonstrated normal LV function.  She is maintained on daily furosemide therapy.  3. Essential hypertension The patient's blood pressure is controlled on her current regimen.  Continue current therapy.   4. Sick sinus syndrome (HCC) 5. Cardiac pacemaker in situ Continue follow-up with EP as planned.  6. Anemia, unspecified type Continue follow-up with hematology (Dr. Alen Blew) as planned.   Time:   Today, I have spent 10 minutes with the patient with telehealth technology discussing the above problems.     Medication Adjustments/Labs and Tests Ordered: Current medicines are reviewed at length with the patient today.  Concerns regarding medicines are outlined above.   Tests Ordered: No orders of the defined types were placed in this encounter.   Medication Changes: No orders of the defined types were placed in this encounter.   Follow Up:  In Person With Dr. Rayann Heman in December as scheduled.  Since she was seen today, I will see if Dr. Rayann Heman would like to push back her appt with him for 6 mos.  As  she has primarily seen Dr. Rayann Heman over the past few years, she will continue follow up with him and see Dr. Acie Fredrickson or me as needed.   Signed, Richardson Dopp, PA-C  11/09/2018 2:41 PM    [facility]

## 2018-11-09 NOTE — Patient Instructions (Signed)
Medication Instructions:  Your physician recommends that you continue on your current medications as directed. Please refer to the Current Medication list given to you today.  If you need a refill on your cardiac medications before your next appointment, please call your pharmacy.   Lab work: None   If you have labs (blood work) drawn today and your tests are completely normal, you will receive your results only by: [name] MyChart Message (if you have MyChart) OR . A paper copy in the mail If you have any lab test that is abnormal or we need to change your treatment, we will call you to review the results.  Testing/Procedures: None   Follow-Up: You are scheduled to see Dr. Rayann Heman on 01/17/2019 @ 2:30 PM   Any Other Special Instructions Will Be Listed Below (If Applicable).

## 2018-11-18 ENCOUNTER — Inpatient Hospital Stay: Payer: Medicare Other | Attending: Oncology

## 2018-11-18 ENCOUNTER — Inpatient Hospital Stay (HOSPITAL_BASED_OUTPATIENT_CLINIC_OR_DEPARTMENT_OTHER): Payer: Medicare Other | Admitting: Oncology

## 2018-11-18 ENCOUNTER — Other Ambulatory Visit: Payer: Self-pay

## 2018-11-18 VITALS — BP 122/72 | HR 89 | Temp 98.7°F | Resp 18 | Ht 62.0 in | Wt 142.9 lb

## 2018-11-18 DIAGNOSIS — D638 Anemia in other chronic diseases classified elsewhere: Secondary | ICD-10-CM | POA: Diagnosis not present

## 2018-11-18 DIAGNOSIS — D649 Anemia, unspecified: Secondary | ICD-10-CM

## 2018-11-18 DIAGNOSIS — D509 Iron deficiency anemia, unspecified: Secondary | ICD-10-CM | POA: Diagnosis not present

## 2018-11-18 LAB — CBC WITH DIFFERENTIAL (CANCER CENTER ONLY)
Abs Immature Granulocytes: 0.01 10*3/uL (ref 0.00–0.07)
Basophils Absolute: 0 10*3/uL (ref 0.0–0.1)
Basophils Relative: 0 %
Eosinophils Absolute: 0.1 10*3/uL (ref 0.0–0.5)
Eosinophils Relative: 2 %
HCT: 30 % — ABNORMAL LOW (ref 36.0–46.0)
Hemoglobin: 9.7 g/dL — ABNORMAL LOW (ref 12.0–15.0)
Immature Granulocytes: 0 %
Lymphocytes Relative: 16 %
Lymphs Abs: 1.1 10*3/uL (ref 0.7–4.0)
MCH: 29.1 pg (ref 26.0–34.0)
MCHC: 32.3 g/dL (ref 30.0–36.0)
MCV: 90.1 fL (ref 80.0–100.0)
Monocytes Absolute: 0.6 10*3/uL (ref 0.1–1.0)
Monocytes Relative: 10 %
Neutro Abs: 4.8 10*3/uL (ref 1.7–7.7)
Neutrophils Relative %: 72 %
Platelet Count: 201 10*3/uL (ref 150–400)
RBC: 3.33 MIL/uL — ABNORMAL LOW (ref 3.87–5.11)
RDW: 13.8 % (ref 11.5–15.5)
WBC Count: 6.7 10*3/uL (ref 4.0–10.5)
nRBC: 0 % (ref 0.0–0.2)

## 2018-11-18 LAB — FERRITIN: Ferritin: 28 ng/mL (ref 11–307)

## 2018-11-18 LAB — IRON AND TIBC
Iron: 39 ug/dL — ABNORMAL LOW (ref 41–142)
Saturation Ratios: 12 % — ABNORMAL LOW (ref 21–57)
TIBC: 328 ug/dL (ref 236–444)
UIBC: 288 ug/dL (ref 120–384)

## 2018-11-18 NOTE — Progress Notes (Signed)
Hematology and Oncology Follow Up Visit  Jamisen Ramanathan [id number] 28-Aug-1940 78 y.o. 11/18/2018 8:44 AM Crist Infante, MDPerini, Elta Guadeloupe, MD   Principle Diagnosis: 78 year old woman with iron deficiency anemia diagnosed in June 2020.  She presented with a iron level of 23, saturation of 5% and ferritin of 16.  Her hemoglobin was 7.8.  She has chronic Blood loss is related to cirrhosis of the liver as well as chronic disease.   Prior Therapy:  She is status post Feraheme infusion for a total of 1020 mg on 2 separate infusions completed on 07/27/2018.   Current therapy: He is currently receiving B12 injection on a monthly basis.  Interim History: Ms. Juby presents today for a follow-up visit.  Since her last visit, she reports feeling reasonably well without any major complaints.  She tolerated intravenous iron infusion with improvement in her overall performance status and activity level.  She denied any hematochezia, melena or hemoptysis.  She denies any chest pain or difficulty breathing.  Her performance status and quality of life remains unchanged.   He denied any alteration mental status, neuropathy, confusion or dizziness.  Denies any headaches or lethargy.  Denies any night sweats, weight loss or changes in appetite.  Denied orthopnea, dyspnea on exertion or chest discomfort.  Denies shortness of breath, difficulty breathing hemoptysis or cough.  Denies any abdominal distention, nausea, early satiety or dyspepsia.  Denies any hematuria, frequency, dysuria or nocturia.  Denies any skin irritation, dryness or rash.  Denies any ecchymosis or petechiae.  Denies any lymphadenopathy or clotting.  Denies any heat or cold intolerance.  Denies any anxiety or depression.  Remaining review of system is negative.   .    Medications: I have reviewed the patient's current medications.  Current Outpatient Medications  Medication Sig Dispense Refill  . CALCIUM PO Take 1 tablet by mouth at  bedtime.     . Calcium Polycarbophil (FIBERCON PO) Take 2 tablets by mouth every morning.     . carbidopa-levodopa (SINEMET CR) 50-200 MG tablet TAKE 1 TABLET BY MOUTH EVERYDAY AT BEDTIME 90 tablet 1  . carbidopa-levodopa (SINEMET IR) 25-100 MG tablet Take 1.5 tablets by mouth 3 (three) times daily. 450 tablet 1  . clonazePAM (KLONOPIN) 0.5 MG tablet Take 0.5 tablets (0.25 mg total) by mouth at bedtime. 45 tablet 1  . Coenzyme Q10 (CO Q 10 PO) Take 1 tablet by mouth every morning.     [name] CREON 36000 units CPEP capsule TAKE 1 2 CAPSULES 3 TIMES A DAY WITH MEALS    . cyanocobalamin (,VITAMIN B-12,) 1000 MCG/ML injection INJECT 1ML INTRAMUSCULARLY IN THE DELTOID ONCE A MONTH (ALTERNATING DELT. EACH MONTH)    . Docusate Sodium (COLACE PO) Take 1 capsule by mouth 2 (two) times daily as needed (constipation).     [name] ELIQUIS 5 MG TABS tablet Take 5 mg by mouth 2 (two) times daily.     [name] ezetimibe (ZETIA) 10 MG tablet Take 10 mg by mouth every other day. Taking the same day as the crestor    . furosemide (LASIX) 40 MG tablet TAKE 1 TABLET TWICE A DAY 180 tablet 3  . levothyroxine (SYNTHROID, LEVOTHROID) 100 MCG tablet Take 50-100 mcg by mouth See admin instructions. Take 100 mg by mouth on Tuesday, Thursday, Saturday and Sunday. Take 50 mg by mouth on Monday, Wednesday and Friday  2  . MAGNESIUM PO Take 1 tablet by mouth at bedtime.     . metoprolol succinate (TOPROL-XL) 25 MG  24 hr tablet TAKE 1 TABLET BY MOUTH EVERY DAY 90 tablet 2  . multivitamin-lutein (OCUVITE-LUTEIN) CAPS capsule Take 1 capsule by mouth daily.    [name] OVER THE COUNTER MEDICATION CBD oil    . polyethylene glycol (MIRALAX / GLYCOLAX) packet Take 17 g by mouth 2 (two) times daily. (Patient taking differently: Take 17 g by mouth daily as needed. ) 14 each 0  . potassium chloride (K-DUR,KLOR-CON) 10 MEQ tablet Take 10 mEq by mouth every morning.    . raloxifene (EVISTA) 60 MG tablet Take 60 mg by mouth daily.  3  . rasagiline (AZILECT) 1 MG  TABS tablet TAKE 1 TABLET BY MOUTH EVERY DAY 90 tablet 1  . rosuvastatin (CRESTOR) 5 MG tablet Take 2.5 mg by mouth every other day.    . valACYclovir (VALTREX) 1000 MG tablet Take 1,000 mg by mouth 2 (two) times daily as needed (fever blisters).    [name] VITAMIN D, ERGOCALCIFEROL, PO Take 1,000 Units by mouth daily.      No current facility-administered medications for this visit.      Allergies:  Allergies  Allergen Reactions  . Statins     Leg weakness - but tolerating low dose Crestor every other day    Past Medical History, Surgical history, Social history, and Family History were reviewed and updated.   Physical Exam: Blood pressure 122/72, pulse 89, temperature 98.7 F (37.1 C), temperature source Oral, resp. rate 18, height 5\' 2"  (1.575 m), weight 142 lb 14.4 oz (64.8 kg), last menstrual period 02/04/1996, SpO2 98 %.   ECOG: 1 General appearance: alert and cooperative appeared without distress. Head: Normocephalic, without obvious abnormality Oropharynx: No oral thrush or ulcers. Eyes: No scleral icterus.  Pupils are equal and round reactive to light. Lymph nodes: Cervical, supraclavicular, and axillary nodes normal. Heart:regular rate and rhythm, S1, S2 normal, no murmur, click, rub or gallop Lung:chest clear, no wheezing, rales, normal symmetric air entry Abdomin: soft, non-tender, without masses or organomegaly. Neurological: No motor, sensory deficits.  Intact deep tendon reflexes. Skin: No rashes or lesions.  No ecchymosis or petechiae. Musculoskeletal: No joint deformity or effusion. Psychiatric: Mood and affect are appropriate.    Lab Results: Lab Results  Component Value Date   WBC 7.2 07/16/2018   HGB 7.8 (L) 07/16/2018   HCT 25.6 (L) 07/16/2018   MCV 83.1 07/16/2018   PLT 198 07/16/2018     Chemistry      Component Value Date/Time   NA 139 11/10/2016 1023   K 3.8 11/10/2016 1023   CL 99 (L) 11/10/2016 1023   CO2 32 11/10/2016 1023   BUN 19  11/10/2016 1023   CREATININE 0.79 11/10/2016 1023      Component Value Date/Time   CALCIUM 9.5 11/10/2016 1023   ALKPHOS 73 11/24/2012 0055   AST 32 11/24/2012 0055   ALT 21 11/24/2012 0055   BILITOT 1.4 (H) 11/24/2012 0055         Impression and Plan:   78 year old woman with:   1.    Multifactorial anemia diagnosed in June 2020 but could be detected as far back as 2018.  She has element of iron deficiency as well as chronic disease.  He is status post intravenous iron infusion in June 2020 without any major complications.  Other factors contributing to her anemia including B12 deficiency as well as chronic disease.  At this time, I recommended continued monitoring and replace intravenous iron as needed.  Her hemoglobin today is improved  although still below normal at this time.  Will await iron studies and replace as needed.  She is agreeable to have that done and will let communicate the results of her iron studies from today once they are available.  Growth factor support in the form of Aranesp or Procrit may be needed for hemoglobin continues to be low despite adequate levels of iron and B12.  2.  B12 deficiency: She is currently receiving B12 injections as well as oral replacement.  3.  Follow-up: We will be in 4 months for repeat evaluation.  15  minutes was spent with the patient face-to-face today.  More than 50% of time was dedicated to reviewing laboratory data, differential diagnosis and management options for the future.     Zola Button, MD 10/15/20208:44 AM

## 2018-12-23 ENCOUNTER — Other Ambulatory Visit: Payer: Self-pay | Admitting: Neurology

## 2019-01-04 ENCOUNTER — Other Ambulatory Visit: Payer: Self-pay | Admitting: Internal Medicine

## 2019-01-05 DIAGNOSIS — K7400 Hepatic fibrosis, unspecified: Secondary | ICD-10-CM | POA: Diagnosis not present

## 2019-01-05 DIAGNOSIS — R140 Abdominal distension (gaseous): Secondary | ICD-10-CM | POA: Diagnosis not present

## 2019-01-10 ENCOUNTER — Other Ambulatory Visit: Payer: Self-pay | Admitting: Gastroenterology

## 2019-01-10 DIAGNOSIS — K7400 Hepatic fibrosis, unspecified: Secondary | ICD-10-CM

## 2019-01-17 ENCOUNTER — Encounter: Payer: Medicare Other | Admitting: Internal Medicine

## 2019-01-25 ENCOUNTER — Other Ambulatory Visit: Payer: Self-pay | Admitting: Internal Medicine

## 2019-01-27 ENCOUNTER — Ambulatory Visit (INDEPENDENT_AMBULATORY_CARE_PROVIDER_SITE_OTHER): Payer: Medicare Other | Admitting: *Deleted

## 2019-01-27 DIAGNOSIS — I495 Sick sinus syndrome: Secondary | ICD-10-CM | POA: Diagnosis not present

## 2019-01-30 LAB — CUP PACEART REMOTE DEVICE CHECK
Battery Remaining Longevity: 33 mo
Battery Voltage: 2.94 V
Brady Statistic AP VP Percent: 0 %
Brady Statistic AP VS Percent: 0 %
Brady Statistic AS VP Percent: 0 %
Brady Statistic AS VS Percent: 0 %
Brady Statistic RA Percent Paced: 0 %
Brady Statistic RV Percent Paced: 61.69 %
Date Time Interrogation Session: 20201225192645
Implantable Lead Implant Date: 20140917
Implantable Lead Implant Date: 20140917
Implantable Lead Location: 753859
Implantable Lead Location: 753860
Implantable Pulse Generator Implant Date: 20140917
Lead Channel Impedance Value: 323 Ohm
Lead Channel Impedance Value: 361 Ohm
Lead Channel Impedance Value: 361 Ohm
Lead Channel Impedance Value: 437 Ohm
Lead Channel Pacing Threshold Amplitude: 0.625 V
Lead Channel Pacing Threshold Amplitude: 0.875 V
Lead Channel Pacing Threshold Pulse Width: 0.4 ms
Lead Channel Pacing Threshold Pulse Width: 0.4 ms
Lead Channel Sensing Intrinsic Amplitude: 0.25 mV
Lead Channel Sensing Intrinsic Amplitude: 0.25 mV
Lead Channel Sensing Intrinsic Amplitude: 14.125 mV
Lead Channel Sensing Intrinsic Amplitude: 14.125 mV
Lead Channel Setting Pacing Amplitude: 2.5 V
Lead Channel Setting Pacing Pulse Width: 0.4 ms
Lead Channel Setting Sensing Sensitivity: 0.9 mV

## 2019-01-30 NOTE — Progress Notes (Signed)
PPM remote 

## 2019-02-07 ENCOUNTER — Other Ambulatory Visit: Payer: Self-pay

## 2019-02-07 ENCOUNTER — Ambulatory Visit
Admission: RE | Admit: 2019-02-07 | Discharge: 2019-02-07 | Disposition: A | Discharge status: Home / Self Care | Payer: Medicare Other | Source: Ambulatory Visit | Attending: Gastroenterology | Admitting: Gastroenterology

## 2019-02-07 DIAGNOSIS — K7400 Hepatic fibrosis, unspecified: Secondary | ICD-10-CM

## 2019-02-07 DIAGNOSIS — D62 Acute posthemorrhagic anemia: Secondary | ICD-10-CM | POA: Diagnosis not present

## 2019-02-10 DIAGNOSIS — K7400 Hepatic fibrosis, unspecified: Secondary | ICD-10-CM | POA: Diagnosis not present

## 2019-02-10 DIAGNOSIS — Z23 Encounter for immunization: Secondary | ICD-10-CM | POA: Diagnosis not present

## 2019-02-16 DIAGNOSIS — Z23 Encounter for immunization: Secondary | ICD-10-CM | POA: Diagnosis not present

## 2019-02-18 ENCOUNTER — Other Ambulatory Visit (HOSPITAL_COMMUNITY): Payer: Self-pay | Admitting: *Deleted

## 2019-02-21 ENCOUNTER — Encounter (HOSPITAL_COMMUNITY)
Admission: RE | Admit: 2019-02-21 | Discharge: 2019-02-21 | Disposition: A | Discharge status: Home / Self Care | Payer: Medicare Other | Source: Ambulatory Visit | Attending: Internal Medicine | Admitting: Internal Medicine

## 2019-02-21 ENCOUNTER — Other Ambulatory Visit: Payer: Self-pay

## 2019-02-21 DIAGNOSIS — D649 Anemia, unspecified: Secondary | ICD-10-CM | POA: Diagnosis not present

## 2019-02-21 MED ORDER — SODIUM CHLORIDE 0.9 % IV SOLN
510.0000 mg | INTRAVENOUS | Status: DC
  Administered 2019-02-21: 510 mg via INTRAVENOUS
  Filled 2019-02-21: qty 17

## 2019-02-28 ENCOUNTER — Other Ambulatory Visit: Payer: Self-pay

## 2019-02-28 ENCOUNTER — Encounter (HOSPITAL_COMMUNITY)
Admission: RE | Admit: 2019-02-28 | Discharge: 2019-02-28 | Disposition: A | Discharge status: Home / Self Care | Payer: Medicare Other | Source: Ambulatory Visit | Attending: Internal Medicine | Admitting: Internal Medicine

## 2019-02-28 DIAGNOSIS — D649 Anemia, unspecified: Secondary | ICD-10-CM | POA: Diagnosis not present

## 2019-02-28 MED ORDER — SODIUM CHLORIDE 0.9 % IV SOLN
510.0000 mg | INTRAVENOUS | Status: AC
  Administered 2019-02-28: 510 mg via INTRAVENOUS
  Filled 2019-02-28: qty 510

## 2019-03-01 ENCOUNTER — Encounter: Payer: Self-pay | Admitting: Obstetrics & Gynecology

## 2019-03-01 ENCOUNTER — Ambulatory Visit (INDEPENDENT_AMBULATORY_CARE_PROVIDER_SITE_OTHER): Payer: Medicare Other | Admitting: Obstetrics & Gynecology

## 2019-03-01 ENCOUNTER — Other Ambulatory Visit: Payer: Self-pay

## 2019-03-01 VITALS — BP 118/70 | HR 84 | Temp 97.4°F | Resp 10 | Ht 61.5 in | Wt 144.8 lb

## 2019-03-01 DIAGNOSIS — Z01419 Encounter for gynecological examination (general) (routine) without abnormal findings: Secondary | ICD-10-CM | POA: Diagnosis not present

## 2019-03-01 DIAGNOSIS — Z124 Encounter for screening for malignant neoplasm of cervix: Secondary | ICD-10-CM | POA: Diagnosis not present

## 2019-03-01 NOTE — Progress Notes (Signed)
79 y.o. G2P2 Married White or Caucasian female here for annual exam.  Doing well.  Has gotten the first Covid vaccine.    Denies vaginal bleeding.    Having some issues with anemia.  Did have evaluation with Dr. Cristina Gong in 2019.  Thinks maybe she is having some mild intermittently bleeding due to her eliquis.  Has recently had two iron infusions.  Dr. Cristina Gong want her to repeat the endoscopy.    Patient's last menstrual period was 02/04/1996.          Sexually active: Yes.    The current method of family planning is post menopausal status.    Exercising: No.  The patient does not participate in regular exercise at present. Smoker:  no  Health Maintenance: Pap:   10/09/16 Neg              05/29/14 Neg History of abnormal Pap:  yes MMG:  06/23/18 BIRADS 1 negative/density b Colonoscopy:  2019 with Dr. Cristina Gong done due to concerns of possible bleeding.  This was negative.  EGD was negative.  Also had small bowel evaluation. BMD:   2017 TDaP:  2011.  Discussed today.  As she is receiving the Covid vaccination series now, will wait.  She will discuss with Dr. Merryl Hacker later this year.   Pneumonia vaccine(s):  completed Shingrix:   completed Hep C testing: n/a Screening Labs: PCP (Dr. Eual Fines virtual visit in December)   reports that she has never smoked. She has never used smokeless tobacco. She reports that she does not drink alcohol or use drugs.  Past Medical History:  Diagnosis Date  . Anemia   . Arthritis    thumbs  . Atrial tachycardia ([place])   . Atypical atrial flutter ([place])   . Cataract    bilateral  . Chronic anticoagulation    on Xarelto  . Chronic diastolic CHF (congestive heart failure) (HCC)    Echocardiogram (11/24/12): Mild LVH, EF 60%.  . Dysrhythmia   . GERD (gastroesophageal reflux disease)    hx of no meds  . Heart murmur    slight  . Hyperlipidemia    a. Hx of leg weakness while on statin. under control  . Hypertension   . Hypothyroidism   . Leg fracture  Dec 21,2011  . Osteoporosis 07/2007  . Parkinson's disease ([place]) 2011  . Persistent atrial fibrillation (H[facility])    s/p atrial fib ablation 11-11-11.   [name] PONV (postoperative nausea and vomiting)   . Presence of permanent cardiac pacemaker    permanent Medtronic  . Shortness of breath    "when walking at incline or stairs"  . 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 Squamous carcinoma summer 2015   back of left thigh  . Wrist fracture    right    Past Surgical History:  Procedure Laterality Date  . ATRIAL FIBRILLATION ABLATION N/A 11/11/2011   Procedure: ATRIAL FIBRILLATION ABLATION;  Surgeon: Thompson Grayer, MD;  Location: [facility] CATH LAB;  Service: Cardiovascular;  Laterality: N/A;  . BREAST BIOPSY  1998  . BREAST SURGERY     benign cyst removed  . CARDIAC ELECTROPHYSIOLOGY STUDY AND ABLATION  5/14, 7/14   convergent AF ablation at [facility] and subsequent atypical atrial flutter ablation by Dr Lehman Prom  . CARDIOVASCULAR STRESS TEST  06-06-2008   EF 73%  . CARDIOVERSION  09/09/2011   Procedure: CARDIOVERSION;  Surgeon: Darlin Coco, MD;  Location: [facility] ENDOSCOPY;  Service: Cardiovascular;  Laterality: N/A;  to be done by  dr. Mare Ferrari  . CARDIOVERSION  02/18/2012   Procedure: CARDIOVERSION;  Surgeon: Thayer Headings, MD;  Location: [place];  Service: Cardiovascular;  Laterality: N/A;  . CHOLECYSTECTOMY  1995  . FEMUR FRACTURE SURGERY  2011   metal pin in place  . FOREARM SURGERY Left 2010   "opened arm to drain it"  . INSERTION OF MESH N/A 11/24/2013   Procedure: INSERTION OF MESH;  Surgeon: Michael Boston, MD;  Location: [facility] ORS;  Service: General;  Laterality: N/A;  . KNEE SURGERY Left 2001  . Left unicompartmental knee     Dr. Alvan Dame 11/18/16  . PACEMAKER PLACEMENT  10/20/12   MDT Advisa DR implanted by Dr Lehman Prom at [facility]   . PARTIAL KNEE ARTHROPLASTY Left 11/18/2016   Procedure: LEFT UNICOMPARTMENTAL KNEE Medially;  Surgeon: Paralee Cancel, MD;  Location: [facility] ORS;  Service: Orthopedics;   Laterality: Left;  90 mins  . SKIN CANCER DESTRUCTION  summer 2015  . TEE WITHOUT CARDIOVERSION  11/10/2011   Procedure: TRANSESOPHAGEAL ECHOCARDIOGRAM (TEE);  Surgeon: Thayer Headings, MD;  Location: [place];  Service: Cardiovascular;  Laterality: N/A;  . TONSILLECTOMY  as child  . US ECHOCARDIOGRAPHY  03-10-2008   Est EF 55-60%, Dr. Cathie Olden  . VENTRAL HERNIA REPAIR N/A 11/24/2013   Procedure: LAPAROSCOPIC VENTRAL HERNIA;  Surgeon: Michael Boston, MD;  Location: [facility];  Service: General;  Laterality: N/A;  . WRIST SURGERY Right ~2009   metal rod in place    Current Outpatient Medications  Medication Sig Dispense Refill  . CALCIUM PO Take 1 tablet by mouth at bedtime.     . Calcium Polycarbophil (FIBERCON PO) Take 2 tablets by mouth every morning.     . carbidopa-levodopa (SINEMET CR) 50-200 MG tablet TAKE 1 TABLET BY MOUTH EVERYDAY AT BEDTIME 90 tablet 1  . carbidopa-levodopa (SINEMET IR) 25-100 MG tablet TAKE 1 & 1/2 TABLET 3 TIMES A DAY 360 tablet 2  . clonazePAM (KLONOPIN) 0.5 MG tablet Take 0.5 tablets (0.25 mg total) by mouth at bedtime. 45 tablet 1  . Coenzyme Q10 (CO Q 10 PO) Take 1 tablet by mouth every morning.     . cyanocobalamin (,VITAMIN B-12,) 1000 MCG/ML injection INJECT 1ML INTRAMUSCULARLY IN THE DELTOID ONCE A MONTH (ALTERNATING DELT. EACH MONTH)    . Docusate Sodium (COLACE PO) Take 1 capsule by mouth 2 (two) times daily as needed (constipation).     [name] ELIQUIS 5 MG TABS tablet Take 5 mg by mouth 2 (two) times daily.     [name] ezetimibe (ZETIA) 10 MG tablet Take 10 mg by mouth every other day. Taking the same day as the crestor    . furosemide (LASIX) 40 MG tablet Take 1 tablet (40 mg total) by mouth 2 (two) times daily. Please make overdue appt with Dr. Rayann Heman before anymore refills. 1st attempt 60 tablet 0  . levothyroxine (SYNTHROID, LEVOTHROID) 100 MCG tablet Take 50-100 mcg by mouth See admin instructions. Take 100 mg by mouth on Tuesday, Thursday, Saturday and Sunday.  Take 50 mg by mouth on Monday, Wednesday and Friday  2  . Magnesium Oxide 500 MG TABS Take by mouth.    [name] MAGNESIUM PO Take 1 tablet by mouth at bedtime.     . metoprolol succinate (TOPROL-XL) 25 MG 24 hr tablet TAKE 1 TABLET BY MOUTH DAILY. PLEASE MAKE OVERDUE APPT WITH DR. ALLRED BEFORE ANYMORE REFILLS. 1ST ATTEMPT 90 tablet 3  . multivitamin-lutein (OCUVITE-LUTEIN) CAPS capsule Take 1 capsule by mouth daily.    [name]  OVER THE COUNTER MEDICATION CBD oil    . polyethylene glycol (MIRALAX / GLYCOLAX) packet Take 17 g by mouth 2 (two) times daily. (Patient taking differently: Take 17 g by mouth daily as needed. ) 14 each 0  . potassium chloride (K-DUR,KLOR-CON) 10 MEQ tablet Take 10 mEq by mouth every morning.    . raloxifene (EVISTA) 60 MG tablet Take 60 mg by mouth daily.  3  . rasagiline (AZILECT) 1 MG TABS tablet TAKE 1 TABLET BY MOUTH EVERY DAY 90 tablet 1  . rosuvastatin (CRESTOR) 5 MG tablet Take 2.5 mg by mouth every other day.    . valACYclovir (VALTREX) 1000 MG tablet Take 1,000 mg by mouth 2 (two) times daily as needed (fever blisters).    [name] VITAMIN D, ERGOCALCIFEROL, PO Take 1,000 Units by mouth daily.      No current facility-administered medications for this visit.    Family History  Problem Relation Age of Onset  . Alzheimer's disease Mother   . Heart failure Father   . Healthy Child   . Healthy Child   . Breast cancer Maternal Aunt     Review of Systems  All other systems reviewed and are negative.   Exam:   BP 118/70 (BP Location: Right Arm, Patient Position: Sitting, Cuff Size: Normal)   Pulse 84   Temp (!) 97.4 F (36.3 C) (Temporal)   Resp 10   Ht 5' 1.5" (1.562 m)   Wt 144 lb 12.8 oz (65.7 kg)   LMP 02/04/1996   BMI 26.92 kg/m    Height: 5' 1.5" (156.2 cm)  Ht Readings from Last 3 Encounters:  03/01/19 5' 1.5" (1.562 m)  02/28/19 5' 1.5" (1.562 m)  02/21/19 5' 1.5" (1.562 m)    General appearance: alert, cooperative and appears stated age Head:  Normocephalic, without obvious abnormality, atraumatic Neck: no adenopathy, supple, symmetrical, trachea midline and thyroid normal to inspection and palpation Lungs: clear to auscultation bilaterally Breasts: normal appearance, no masses or tenderness Heart: regular rate and rhythm Abdomen: soft, non-tender; bowel sounds normal; no masses,  no organomegaly Extremities: extremities normal, atraumatic, no cyanosis or edema Skin: Skin color, texture, turgor normal. No rashes or lesions Lymph nodes: Cervical, supraclavicular, and axillary nodes normal. No abnormal inguinal nodes palpated Neurologic: Grossly normal   Pelvic: External genitalia:  no lesions              Urethra:  normal appearing urethra with no masses, tenderness or lesions              Bartholins and Skenes: normal                 Vagina: normal appearing vagina with normal color and discharge, no lesions              Cervix: no lesions              Pap taken: No. Bimanual Exam:  Uterus:  normal size, contour, position, consistency, mobility, non-tender              Adnexa: normal adnexa and no mass, fullness, tenderness               Rectovaginal: Confirms               Anus:  normal sphincter tone, no lesions  Chaperone, Evern Core, RN, was present for exam.  A:  Well Woman with normal exam PMP, no HRT H/o afib, sick sinus syndrome, chronic diastolic CHF (followed  by Dr. Rayann Heman) Hypothyroidism Osteoporosis  Parkinson's disease  Anemia possibly from intermittent GI bleeding (followed by Dr. Cristina Gong and Dr. Osker Mason) Having iron infusions  P:   Mammogram guidelines reviewed pap smear not obtained.  Not indicated at this time. Lab work done with Dr. Joylene Draft Tdap due per my records.  She is going to not receive this today as she is in the middle of her Covid vaccination series.  Will plan to have with Dr. Silvestre Mesi office later in the year Return annually or prn

## 2019-03-08 DIAGNOSIS — D225 Melanocytic nevi of trunk: Secondary | ICD-10-CM | POA: Diagnosis not present

## 2019-03-08 DIAGNOSIS — Z85828 Personal history of other malignant neoplasm of skin: Secondary | ICD-10-CM | POA: Diagnosis not present

## 2019-03-08 DIAGNOSIS — L821 Other seborrheic keratosis: Secondary | ICD-10-CM | POA: Diagnosis not present

## 2019-03-08 DIAGNOSIS — L570 Actinic keratosis: Secondary | ICD-10-CM | POA: Diagnosis not present

## 2019-03-08 DIAGNOSIS — L820 Inflamed seborrheic keratosis: Secondary | ICD-10-CM | POA: Diagnosis not present

## 2019-03-11 NOTE — Progress Notes (Signed)
Virtual Visit via Video Note The purpose of this virtual visit is to provide medical care while limiting exposure to the novel coronavirus.    Consent was obtained for video visit:  Yes.   Answered questions that patient had about telehealth interaction:  Yes.   I discussed the limitations, risks, security and privacy concerns of performing an evaluation and management service by telemedicine. I also discussed with the patient that there may be a patient responsible charge related to this service. The patient expressed understanding and agreed to proceed.  Pt location: Home Physician Location: office Name of referring provider:  Crist Infante, MD I connected with Starla Link Schubert at patients initiation/request on 03/14/2019 at  2:00 PM EST by video enabled telemedicine application and verified that I am speaking with the correct person using two identifiers. Pt MRN:  [id number] Pt DOB:  17-Feb-1940 Video Participants:  Starla Link Ceci;     History of Present Illness:  Patient seen today in follow-up for Parkinson's disease.  My previous records as well as any outside records made available were reviewed prior to todays visit.  Pt denies falls.  Noting some start hesitation.  Pt denies lightheadedness, near syncope.  No hallucinations.  Mood has been good.  Not exercising faithfully.   Patient last saw the cardiology physician assistant on October 6.  The klonopin, 1/2 po q hs has helped sleep and dreaming.  Current movement d/o meds: Carbidopa/levodopa 25/100, 1 tablet 3 times per day Carbidopa/levodopa 50/200 at bedtime azilect Klonopin, 1/2 po q hs    Current Outpatient Medications on File Prior to Visit  Medication Sig Dispense Refill  . CALCIUM PO Take 1 tablet by mouth at bedtime.     . Calcium Polycarbophil (FIBERCON PO) Take 2 tablets by mouth every morning.     . carbidopa-levodopa (SINEMET CR) 50-200 MG tablet TAKE 1 TABLET BY MOUTH EVERYDAY AT BEDTIME 90 tablet 1   . carbidopa-levodopa (SINEMET IR) 25-100 MG tablet TAKE 1 & 1/2 TABLET 3 TIMES A DAY 360 tablet 2  . clonazePAM (KLONOPIN) 0.5 MG tablet Take 0.5 tablets (0.25 mg total) by mouth at bedtime. 45 tablet 1  . Coenzyme Q10 (CO Q 10 PO) Take 1 tablet by mouth every morning.     . cyanocobalamin (,VITAMIN B-12,) 1000 MCG/ML injection INJECT 1ML INTRAMUSCULARLY IN THE DELTOID ONCE A MONTH (ALTERNATING DELT. EACH MONTH)    . cyanocobalamin 100 MCG tablet Take 100 mcg by mouth daily.    Mariane Baumgarten Sodium (COLACE PO) Take 1 capsule by mouth 2 (two) times daily as needed (constipation).     [name] ELIQUIS 5 MG TABS tablet Take 5 mg by mouth 2 (two) times daily.     [name] ezetimibe (ZETIA) 10 MG tablet Take 10 mg by mouth every other day. Taking the same day as the crestor    . furosemide (LASIX) 40 MG tablet Take 1 tablet (40 mg total) by mouth 2 (two) times daily. Please make overdue appt with Dr. Rayann Heman before anymore refills. 1st attempt 60 tablet 0  . levothyroxine (SYNTHROID, LEVOTHROID) 100 MCG tablet Take 50-100 mcg by mouth See admin instructions. Take 100 mg by mouth on Tuesday, Thursday, Saturday and Sunday. Take 50 mg by mouth on Monday, Wednesday and Friday  2  . Magnesium Oxide 500 MG TABS Take by mouth.    . metoprolol succinate (TOPROL-XL) 25 MG 24 hr tablet TAKE 1 TABLET BY MOUTH DAILY. PLEASE MAKE OVERDUE APPT WITH DR. ALLRED BEFORE ANYMORE  REFILLS. 1ST ATTEMPT 90 tablet 3  . multivitamin-lutein (OCUVITE-LUTEIN) CAPS capsule Take 1 capsule by mouth daily.    [name] OVER THE COUNTER MEDICATION CBD oil    . polyethylene glycol (MIRALAX / GLYCOLAX) packet Take 17 g by mouth 2 (two) times daily. (Patient taking differently: Take 17 g by mouth daily as needed. ) 14 each 0  . potassium chloride (K-DUR,KLOR-CON) 10 MEQ tablet Take 10 mEq by mouth every morning.    . raloxifene (EVISTA) 60 MG tablet Take 60 mg by mouth daily.  3  . rasagiline (AZILECT) 1 MG TABS tablet TAKE 1 TABLET BY MOUTH EVERY DAY 90 tablet 1   . rosuvastatin (CRESTOR) 5 MG tablet Take 2.5 mg by mouth every other day.    . valACYclovir (VALTREX) 1000 MG tablet Take 1,000 mg by mouth 2 (two) times daily as needed (fever blisters).    [name] VITAMIN D, ERGOCALCIFEROL, PO Take 1,000 Units by mouth daily.      No current facility-administered medications on file prior to visit.     Observations/Objective:   Vitals:   03/14/19 1248  Weight: 145 lb (65.8 kg)  Height: 5\' 2"  (1.575 m)   GEN:  The patient appears stated age and is in NAD.  Neurological examination:  Orientation: The patient is alert and oriented x3. Cranial nerves: There is good facial symmetry. There is minimal facial hypomimia.  The speech is fluent and clear. Soft palate rises symmetrically and there is no tongue deviation. Hearing is intact to conversational tone. Motor: Strength is at least antigravity x 4.   Shoulder shrug is equal and symmetric.  There is no pronator drift.  Movement examination: Tone: unable Abnormal movements: there is head dyskinesia with distraction Coordination:  There is mild decremation with RAM's, with any form of RAMS, including alternating supination and pronation of the forearm, hand opening and closing, finger taps on the L>R Gait and Station: The patient's stride length is good.      Assessment and Plan:   1.  Parkinsons Disease  -continue carbidopa/levodopa 25/100, 1.5 tid  -continue carbidopa/levodopa 50/200 qhs  -continue rasagiline  -offered PT.  Declined for now  -discussed importance of safe, CV exercise  -talked about online exercise programs 2.  Insomnia  -on melatonin  -on klonopin, 0.5 mg, 1/2 po q hs.  Working well.  refilled 3. Anemia secondary to cirrhosis and b12 def  -following w hematology.  Receives IV iron prn    Follow Up Instructions:  6 months   -I discussed the assessment and treatment plan with the patient. The patient was provided an opportunity to ask questions and all were answered. The  patient agreed with the plan and demonstrated an understanding of the instructions.   The patient was advised to call back or seek an in-person evaluation if the symptoms worsen or if the condition fails to improve as anticipated.    Total time spent on today's visit was 30 minutes, including both face-to-face time and nonface-to-face time.  Time included that spent on review of records (prior notes available to me/labs/imaging if pertinent), discussing treatment and goals, answering patient's questions and coordinating care.   Alonza Bogus, DO

## 2019-03-14 ENCOUNTER — Encounter: Payer: Self-pay | Admitting: Neurology

## 2019-03-14 ENCOUNTER — Other Ambulatory Visit: Payer: Self-pay

## 2019-03-14 ENCOUNTER — Telehealth (INDEPENDENT_AMBULATORY_CARE_PROVIDER_SITE_OTHER): Payer: Medicare Other | Admitting: Neurology

## 2019-03-14 VITALS — Ht 62.0 in | Wt 145.0 lb

## 2019-03-14 DIAGNOSIS — G20 Parkinson's disease: Secondary | ICD-10-CM

## 2019-03-14 DIAGNOSIS — D649 Anemia, unspecified: Secondary | ICD-10-CM | POA: Diagnosis not present

## 2019-03-14 DIAGNOSIS — G4700 Insomnia, unspecified: Secondary | ICD-10-CM | POA: Diagnosis not present

## 2019-03-14 MED ORDER — CARBIDOPA-LEVODOPA ER 50-200 MG PO TBCR: 1.0000 | EXTENDED_RELEASE_TABLET | Freq: Every day | ORAL | 1 refills | Status: DC

## 2019-03-14 MED ORDER — RASAGILINE MESYLATE 1 MG PO TABS: 1.0000 mg | ORAL_TABLET | Freq: Every day | ORAL | 1 refills | Status: DC

## 2019-03-14 MED ORDER — CLONAZEPAM 0.5 MG PO TABS: 0.2500 mg | ORAL_TABLET | Freq: Every day | ORAL | 1 refills | Status: DC

## 2019-03-16 DIAGNOSIS — Z23 Encounter for immunization: Secondary | ICD-10-CM | POA: Diagnosis not present

## 2019-03-17 ENCOUNTER — Other Ambulatory Visit: Payer: Self-pay | Admitting: Internal Medicine

## 2019-03-22 ENCOUNTER — Telehealth: Payer: Self-pay | Admitting: Oncology

## 2019-03-22 NOTE — Telephone Encounter (Signed)
Returned patient's phone call regarding rescheduling 02/19 appointment, per patient's request appointment has moved to 04/13.

## 2019-03-25 ENCOUNTER — Ambulatory Visit: Payer: Medicare Other | Admitting: Oncology

## 2019-03-25 ENCOUNTER — Other Ambulatory Visit: Payer: Medicare Other

## 2019-03-30 DIAGNOSIS — E7849 Other hyperlipidemia: Secondary | ICD-10-CM | POA: Diagnosis not present

## 2019-03-30 DIAGNOSIS — D649 Anemia, unspecified: Secondary | ICD-10-CM | POA: Diagnosis not present

## 2019-04-06 ENCOUNTER — Other Ambulatory Visit: Payer: Self-pay | Admitting: Internal Medicine

## 2019-04-28 ENCOUNTER — Ambulatory Visit (INDEPENDENT_AMBULATORY_CARE_PROVIDER_SITE_OTHER): Payer: Medicare Other | Admitting: *Deleted

## 2019-04-28 DIAGNOSIS — I495 Sick sinus syndrome: Secondary | ICD-10-CM

## 2019-05-02 LAB — CUP PACEART REMOTE DEVICE CHECK
Battery Remaining Longevity: 29 mo
Battery Voltage: 2.93 V
Brady Statistic AP VP Percent: 0 %
Brady Statistic AP VS Percent: 0 %
Brady Statistic AS VP Percent: 0 %
Brady Statistic AS VS Percent: 0 %
Brady Statistic RA Percent Paced: 0 %
Brady Statistic RV Percent Paced: 49.9 %
Date Time Interrogation Session: 20210328224601
Implantable Lead Implant Date: 20140917
Implantable Lead Implant Date: 20140917
Implantable Lead Location: 753859
Implantable Lead Location: 753860
Implantable Pulse Generator Implant Date: 20140917
Lead Channel Impedance Value: 323 Ohm
Lead Channel Impedance Value: 342 Ohm
Lead Channel Impedance Value: 361 Ohm
Lead Channel Impedance Value: 437 Ohm
Lead Channel Pacing Threshold Amplitude: 0.875 V
Lead Channel Pacing Threshold Amplitude: 1 V
Lead Channel Pacing Threshold Pulse Width: 0.4 ms
Lead Channel Pacing Threshold Pulse Width: 0.4 ms
Lead Channel Sensing Intrinsic Amplitude: 0.25 mV
Lead Channel Sensing Intrinsic Amplitude: 0.25 mV
Lead Channel Sensing Intrinsic Amplitude: 12 mV
Lead Channel Sensing Intrinsic Amplitude: 12 mV
Lead Channel Setting Pacing Amplitude: 2.5 V
Lead Channel Setting Pacing Pulse Width: 0.4 ms
Lead Channel Setting Sensing Sensitivity: 0.9 mV

## 2019-05-02 NOTE — Progress Notes (Signed)
PPM Remote  

## 2019-05-16 ENCOUNTER — Telehealth: Payer: Self-pay | Admitting: Oncology

## 2019-05-16 NOTE — Telephone Encounter (Signed)
Called pt back per 4/12 sch message - no answer - left message for pt to call back for reschedule.

## 2019-05-17 ENCOUNTER — Other Ambulatory Visit: Payer: Medicare Other

## 2019-05-17 ENCOUNTER — Ambulatory Visit: Payer: Medicare Other | Admitting: Oncology

## 2019-05-19 ENCOUNTER — Telehealth: Payer: Self-pay

## 2019-05-20 DIAGNOSIS — R7301 Impaired fasting glucose: Secondary | ICD-10-CM | POA: Diagnosis not present

## 2019-05-20 DIAGNOSIS — E538 Deficiency of other specified B group vitamins: Secondary | ICD-10-CM | POA: Diagnosis not present

## 2019-05-20 DIAGNOSIS — E038 Other specified hypothyroidism: Secondary | ICD-10-CM | POA: Diagnosis not present

## 2019-05-20 DIAGNOSIS — E7849 Other hyperlipidemia: Secondary | ICD-10-CM | POA: Diagnosis not present

## 2019-05-20 DIAGNOSIS — M810 Age-related osteoporosis without current pathological fracture: Secondary | ICD-10-CM | POA: Diagnosis not present

## 2019-05-25 ENCOUNTER — Other Ambulatory Visit: Payer: Self-pay | Admitting: Internal Medicine

## 2019-05-25 ENCOUNTER — Telehealth: Payer: Medicare Other | Admitting: Internal Medicine

## 2019-05-25 DIAGNOSIS — Z1212 Encounter for screening for malignant neoplasm of rectum: Secondary | ICD-10-CM | POA: Diagnosis not present

## 2019-05-25 DIAGNOSIS — Z1231 Encounter for screening mammogram for malignant neoplasm of breast: Secondary | ICD-10-CM

## 2019-05-25 NOTE — Progress Notes (Signed)
Cardiology Office Note Date:  05/26/2019  Patient ID:  [name] [name], [name] [name] Mar 09, 1940, MRN [id number] PCP:  Crist Infante, MD  Electrophysiologist:  Dr. Ky Barban    Chief Complaint: 77mo visit  History of Present Illness: [name] [name] Orders is a 79 y.o. female with history of HTN, HLD, Parkinson's disease  (Dr. 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 Osker Mason), chronic CHF (diastolic)  She comes in today to be seen for Dr. Rayann Heman, last seen by him 01/218.  She had SOB that was improved with iron infusions, though EKG noted more prominent T changes and planned for an echo.  Device was programmed VVIR.  She saw Kathleen Argue, PA Oct 2020, described DOE with mod exertion, no SOB otherwise.  Echo noted LVEF 60-65%, mo WMA, PA mod elevated 74mmHgFelt to be stable from volume perspective, no changes were made, planned for usual EP f/u.  She is doing well.  She is not a steady on her feet with the Parkinon's, though no stumbles, falls.  She denies any CP, occassionally has an awareness of her heart beat, but not fast, irregular.  No rest SOB, she sleeps weill with no symptoms or PND or orthopnea.  She has some baseline DOE that is unchanged.  She denies any bleeding or signs of bleeding  Device information MDT dual chamber PPM implanted 10/20/2012  AFib Hx Diagnosed 2006 PVI ablation 11/11/2011, Dr. Rayann Heman AAD HX Flecainide> amiodarone >> rate control strategy   Past Medical History:  Diagnosis Date  . Anemia   . Arthritis    thumbs  . Atrial tachycardia ([place])   . Atypical atrial flutter ([place])   . Cataract    bilateral  . Chronic anticoagulation    on Xarelto  . Chronic diastolic CHF (congestive heart failure) (HCC)    Echocardiogram (11/24/12): Mild LVH, EF 60%.  . Dysrhythmia   . GERD (gastroesophageal reflux disease)    hx of no meds  . Heart murmur    slight  .  Hyperlipidemia    a. Hx of leg weakness while on statin. under control  . Hypertension   . Hypothyroidism   . Leg fracture Dec 21,2011  . Osteoporosis 07/2007  . Parkinson's disease ([place]) 2011  . Persistent atrial fibrillation (HCC)    s/p atrial fib ablation 11-11-11.   [name] [name] PONV (postoperative nausea and vomiting)   . Presence of permanent cardiac pacemaker    permanent Medtronic  . Shortness of breath    "when walking at incline or stairs"  . 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 Squamous carcinoma summer 2015   back of left thigh  . Wrist fracture    right    Past Surgical History:  Procedure Laterality Date  . ATRIAL FIBRILLATION ABLATION N/A 11/11/2011   Procedure: ATRIAL FIBRILLATION ABLATION;  Surgeon: Thompson Grayer, MD;  Location: [facility] CATH LAB;  Service: Cardiovascular;  Laterality: N/A;  . BREAST BIOPSY  1998  . BREAST SURGERY     benign cyst removed  . CARDIAC ELECTROPHYSIOLOGY STUDY AND ABLATION  5/14, 7/14   convergent AF ablation at [facility] and subsequent atypical atrial flutter ablation by Dr Lehman Prom  . CARDIOVASCULAR STRESS TEST  06-06-2008   EF 73%  . CARDIOVERSION  09/09/2011   Procedure: CARDIOVERSION;  Surgeon: Darlin Coco, MD;  Location: [facility] ENDOSCOPY;  Service: Cardiovascular;  Laterality: N/A;  to be done by dr. Mare Ferrari  . CARDIOVERSION  02/18/2012  Procedure: CARDIOVERSION;  Surgeon: Thayer Headings, MD;  Location: [place];  Service: Cardiovascular;  Laterality: N/A;  . CHOLECYSTECTOMY  1995  . FEMUR FRACTURE SURGERY  2011   metal pin in place  . FOREARM SURGERY Left 2010   "opened arm to drain it"  . INSERTION OF MESH N/A 11/24/2013   Procedure: INSERTION OF MESH;  Surgeon: Michael Boston, MD;  Location: [facility] ORS;  Service: General;  Laterality: N/A;  . KNEE SURGERY Left 2001  . Left unicompartmental knee     Dr. Alvan Dame 11/18/16  . PACEMAKER PLACEMENT  10/20/12   MDT Advisa DR implanted by Dr Lehman Prom at [facility]   . PARTIAL KNEE ARTHROPLASTY Left 11/18/2016    Procedure: LEFT UNICOMPARTMENTAL KNEE Medially;  Surgeon: Paralee Cancel, MD;  Location: [facility] ORS;  Service: Orthopedics;  Laterality: Left;  90 mins  . SKIN CANCER DESTRUCTION  summer 2015  . TEE WITHOUT CARDIOVERSION  11/10/2011   Procedure: TRANSESOPHAGEAL ECHOCARDIOGRAM (TEE);  Surgeon: Thayer Headings, MD;  Location: [place];  Service: Cardiovascular;  Laterality: N/A;  . TONSILLECTOMY  as child  . US ECHOCARDIOGRAPHY  03-10-2008   Est EF 55-60%, Dr. Cathie Olden  . VENTRAL HERNIA REPAIR N/A 11/24/2013   Procedure: LAPAROSCOPIC VENTRAL HERNIA;  Surgeon: Michael Boston, MD;  Location: [facility];  Service: General;  Laterality: N/A;  . WRIST SURGERY Right ~2009   metal rod in place    Current Outpatient Medications  Medication Sig Dispense Refill  . CALCIUM PO Take 1 tablet by mouth at bedtime.     . Calcium Polycarbophil (FIBERCON PO) Take 2 tablets by mouth every morning.     . carbidopa-levodopa (SINEMET CR) 50-200 MG tablet Take 1 tablet by mouth at bedtime. 90 tablet 1  . carbidopa-levodopa (SINEMET IR) 25-100 MG tablet TAKE 1 & 1/2 TABLET 3 TIMES A DAY 360 tablet 2  . clonazePAM (KLONOPIN) 0.5 MG tablet Take 0.5 tablets (0.25 mg total) by mouth at bedtime. 45 tablet 1  . Coenzyme Q10 (CO Q 10 PO) Take 1 tablet by mouth every morning.     . cyanocobalamin (,VITAMIN B-12,) 1000 MCG/ML injection INJECT 1ML INTRAMUSCULARLY IN THE DELTOID ONCE A MONTH (ALTERNATING DELT. EACH MONTH)    . cyanocobalamin 100 MCG tablet Take 100 mcg by mouth daily.    [name] [name] Sodium (COLACE PO) Take 1 capsule by mouth 2 (two) times daily as needed (constipation).     [name] [name] ELIQUIS 5 MG TABS tablet Take 5 mg by mouth 2 (two) times daily.     [name] [name] ezetimibe (ZETIA) 10 MG tablet Take 10 mg by mouth every other day. Taking the same day as the crestor    . furosemide (LASIX) 40 MG tablet Take 1 tablet (40 mg total) by mouth 2 (two) times daily. 60 tablet 10  . Magnesium Oxide 500 MG TABS Take by mouth.    . metoprolol  succinate (TOPROL-XL) 25 MG 24 hr tablet TAKE 1 TABLET BY MOUTH DAILY. PLEASE MAKE OVERDUE APPT WITH DR. ALLRED BEFORE ANYMORE REFILLS. 1ST ATTEMPT 90 tablet 3  . multivitamin-lutein (OCUVITE-LUTEIN) CAPS capsule Take 1 capsule by mouth daily.    [name] [name] OVER THE COUNTER MEDICATION CBD oil    . polyethylene glycol (MIRALAX / GLYCOLAX) packet Take 17 g by mouth 2 (two) times daily. (Patient taking differently: Take 17 g by mouth daily as needed. ) 14 each 0  . potassium chloride (K-DUR,KLOR-CON) 10 MEQ tablet Take 10 mEq by mouth 2 (two) times daily.     [name] [name]  raloxifene (EVISTA) 60 MG tablet Take 60 mg by mouth daily.  3  . rasagiline (AZILECT) 1 MG TABS tablet Take 1 tablet (1 mg total) by mouth daily. 90 tablet 1  . rosuvastatin (CRESTOR) 5 MG tablet Take 2.5 mg by mouth every other day.    . valACYclovir (VALTREX) 1000 MG tablet Take 1,000 mg by mouth 2 (two) times daily as needed (fever blisters).    [name] [name] VITAMIN D, ERGOCALCIFEROL, PO Take 1,000 Units by mouth daily.     [name] [name] levothyroxine (SYNTHROID) 50 MCG tablet Take 50 mcg by mouth daily.     No current facility-administered medications for this visit.    Allergies:   Statins   Social History:  The patient  reports that she has never smoked. She has never used smokeless tobacco. She reports that she does not drink alcohol or use drugs.   Family History:  The patient's family history includes Alzheimer's disease in her mother; Breast cancer in her maternal aunt; Healthy in her child and child; Heart failure in her father.  ROS:  Please see the history of present illness.    All other systems are reviewed and otherwise negative.   PHYSICAL EXAM:  VS:  BP (!) 104/58   Pulse 76   Ht 5' 1.75" (1.568 m)   Wt 144 lb (65.3 kg)   LMP 02/04/1996   BMI 26.55 kg/m  BMI: Body mass index is 26.55 kg/m. Well nourished, well developed, in no acute distress  HEENT: normocephalic, atraumatic  Neck: no JVD, carotid bruits or masses Cardiac:  RRR; no  significant murmurs, no rubs, or gallops Lungs:  CTA b/l, no wheezing, rhonchi or rales  Abd: soft, nontender MS: no deformity, age appropriate atrophy Ext: no edema  Skin: warm and dry, no rash Neuro:  No gross deficits appreciated Psych: euthymic mood, full affect  PPM site is stable, no tethering or discomfort   EKG:  Done today and reviewed by myself shows  SR, 70bpm, marked 1st degree AVblock 367ms  PPM interrogation done today and reviewed by myself:  Presenting SR  Battery and RV lead measurements are good VP 49.7%  In review of remotes Remotes: 04/2018 SR, 07/2018 AFlutter, 10/2018 SR, 01/2019 SR, 04/2019 SR  In d/w Dr. Rayann Heman Given we have seen SR for her on a few remotes will program AAIR<=>DDDR Follow AF burden P waves 1.0 mV, sensitivity was changed to 0.35mV    Myoview 02/11/2018  Nuclear stress EF: 72%. The left ventricular ejection fraction is hyperdynamic (>65%).  There was no ST segment deviation noted during stress.  This is a low risk study. No evidence of ischemia.  The study is normal.  Echocardiogram 02/11/2018 EF 60-65, normal wall motion, normal diastolic function, trivial AI, mild MR, normal RV SF, mild TR, PASP 40  CPET 04/25/2015 Conclusion: Exercise testing with gas exchange demonstrates a  moderate functional impairment when compared to matched sedentary  norms. At peak exercise, patient appears primarily circulatory  limited with hypotensive response, low-flat O2 pulse and mild  chronotropic incompetence. Pre-exercise spirometry suggests mild  restrictive lung physiology.  Echocardiogram 03/20/2015 EF 55-60, normal wall motion, trivial AI, mild TR, moderate BAE, moderate TR, PASP 49  Cardiac catheterization 05/25/2012 Coronary angiography: Coronary dominance:left Left mainstem:Short, no CAD. Left anterior descending (LAD):No angiographic CAD. Left circumflex (LCx):Large dominant vessel with no angiographic CAD. Right coronary  artery (RCA):Small, nondominant vessel with no angiographic CAD.  Left ventriculography: Left ventricular systolic function is normal, LVEF is estimated at  55-60%, there is no significant mitral regurgitation  Final Conclusions: Normal LV systolic function, no angiographic CAD.   11/11/2011: EPS/ablation PROCEDURES: 1. Comprehensive electrophysiologic study. 2. Coronary sinus pacing and recording. 3. Three-dimensional mapping of atrial fibrillation with additional mapping and ablation of discrete foci within the left atrium 4. Ablation of atrial fibrillation with additional ablation of discrete foci within the left atrium 5 Intracardiac echocardiography. 6. Transseptal puncture of an intact septum. 7. Rotational Angiography with processing at an independent workstation 8. Arrhythmia induction with pacing 9.External cardioversion.   Recent Labs: 11/18/2018: Hemoglobin 9.7; Platelet Count 201  No results found for requested labs within last 8760 hours.   CrCl cannot be calculated (Patient's most recent lab result is older than the maximum 21 days allowed.).   Wt Readings from Last 3 Encounters:  05/26/19 144 lb (65.3 kg)  03/14/19 145 lb (65.8 kg)  03/01/19 144 lb 12.8 oz (65.7 kg)     Other studies reviewed: Additional studies/records reviewed today include: summarized above  ASSESSMENT AND PLAN:  1. PPM     Programmed as above  2. Longstanding persistent AFib     CHA2DS2Vasc is 5, on Eliquis, appropriately dosed     Follow burden     03/30/19 labs via her PMD, Creat 0.8, K+ 3.9, H/H 9.9/30.7  3. Chronic CHF (diastolic)     No symptoms or exam findings to suggest volume OL      4. HTN     No changes today   Disposition: Will see her back in 5-6 mo, sooner if needed, she will be away for the summer  After the patient left, realized I neglected to check A thresholds after programming changes.  Output on A lead is at 2.0V and I did see AP/VS beats while she was  here. Lead is on adaptive and will run thresholds overnight I have staff messaged the device clinic to reach out to the patient tomorrow to send a manual transmission   Current medicines are reviewed at length with the patient today.  The patient did not have any concerns regarding medicines.  Venetia Night, PA-C 05/26/2019 6:17 PM     [place] [address] [place] [phone] (office)  [phone] (fax)

## 2019-05-26 ENCOUNTER — Other Ambulatory Visit: Payer: Self-pay

## 2019-05-26 ENCOUNTER — Ambulatory Visit (INDEPENDENT_AMBULATORY_CARE_PROVIDER_SITE_OTHER): Payer: Medicare Other | Admitting: Physician Assistant

## 2019-05-26 VITALS — BP 104/58 | HR 76 | Ht 61.75 in | Wt 144.0 lb

## 2019-05-26 DIAGNOSIS — Z950 Presence of cardiac pacemaker: Secondary | ICD-10-CM | POA: Diagnosis not present

## 2019-05-26 DIAGNOSIS — I10 Essential (primary) hypertension: Secondary | ICD-10-CM

## 2019-05-26 DIAGNOSIS — I5032 Chronic diastolic (congestive) heart failure: Secondary | ICD-10-CM | POA: Diagnosis not present

## 2019-05-26 DIAGNOSIS — I4811 Longstanding persistent atrial fibrillation: Secondary | ICD-10-CM | POA: Diagnosis not present

## 2019-05-26 NOTE — Patient Instructions (Signed)
Medication Instructions:   Your physician recommends that you continue on your current medications as directed. Please refer to the Current Medication list given to you today.  *If you need a refill on your cardiac medications before your next appointment, please call your pharmacy*   Lab Work: [facility]    If you have labs (blood work) drawn today and your tests are completely normal, you will receive your results only by: [name] MyChart Message (if you have MyChart) OR . A paper copy in the mail If you have any lab test that is abnormal or we need to change your treatment, we will call you to review the results.   Testing/Procedures: NONE ORDERED  TODAY   Follow-Up: At [facility], you and your health needs are our priority.  As part of our continuing mission to provide you with exceptional heart care, we have created designated Provider Care Teams.  These Care Teams include your primary Cardiologist (physician) and Advanced Practice Providers (APPs -  Physician Assistants and Nurse Practitioners) who all work together to provide you with the care you need, when you need it.  We recommend signing up for the patient portal called "MyChart".  Sign up information is provided on this After Visit Summary.  MyChart is used to connect with patients for Virtual Visits (Telemedicine).  Patients are able to view lab/test results, encounter notes, upcoming appointments, etc.  Non-urgent messages can be sent to your provider as well.   To learn more about what you can do with MyChart, go to [email or web address].    Your next appointment:   3 month(s)  The format for your next appointment:   In Person  Provider:   You may see Thompson Grayer, MD or one of the following Advanced Practice Providers on your designated Care Team:    Chanetta Marshall, NP  Tommye Standard, PA-C  Legrand Como "Oda Kilts, [place]    Other Instructions

## 2019-05-27 ENCOUNTER — Telehealth: Payer: Self-pay | Admitting: Physician Assistant

## 2019-05-27 ENCOUNTER — Telehealth: Payer: Self-pay

## 2019-05-27 DIAGNOSIS — I509 Heart failure, unspecified: Secondary | ICD-10-CM | POA: Diagnosis not present

## 2019-05-27 DIAGNOSIS — M810 Age-related osteoporosis without current pathological fracture: Secondary | ICD-10-CM | POA: Diagnosis not present

## 2019-05-27 DIAGNOSIS — D649 Anemia, unspecified: Secondary | ICD-10-CM | POA: Diagnosis not present

## 2019-05-27 DIAGNOSIS — I480 Paroxysmal atrial fibrillation: Secondary | ICD-10-CM | POA: Diagnosis not present

## 2019-05-27 DIAGNOSIS — G20 Parkinson's disease: Secondary | ICD-10-CM | POA: Diagnosis not present

## 2019-05-27 DIAGNOSIS — Z950 Presence of cardiac pacemaker: Secondary | ICD-10-CM | POA: Diagnosis not present

## 2019-05-27 DIAGNOSIS — D259 Leiomyoma of uterus, unspecified: Secondary | ICD-10-CM | POA: Diagnosis not present

## 2019-05-27 DIAGNOSIS — Z1331 Encounter for screening for depression: Secondary | ICD-10-CM | POA: Diagnosis not present

## 2019-05-27 DIAGNOSIS — K7460 Unspecified cirrhosis of liver: Secondary | ICD-10-CM | POA: Diagnosis not present

## 2019-05-27 DIAGNOSIS — K7689 Other specified diseases of liver: Secondary | ICD-10-CM | POA: Diagnosis not present

## 2019-05-27 DIAGNOSIS — J90 Pleural effusion, not elsewhere classified: Secondary | ICD-10-CM | POA: Diagnosis not present

## 2019-05-27 DIAGNOSIS — R82998 Other abnormal findings in urine: Secondary | ICD-10-CM | POA: Diagnosis not present

## 2019-05-27 DIAGNOSIS — H6123 Impacted cerumen, bilateral: Secondary | ICD-10-CM | POA: Diagnosis not present

## 2019-05-27 DIAGNOSIS — Z0000 Encounter for general adult medical examination without abnormal findings: Secondary | ICD-10-CM | POA: Diagnosis not present

## 2019-05-27 NOTE — Telephone Encounter (Signed)
Spoke with patient and had her send that transmission this morning as you requested. The transmission was sent successfully.

## 2019-05-27 NOTE — Telephone Encounter (Signed)
Called to follow up on device transmission. She feels quite well.  Discussed she was in Aflutter this AM, she felt no different.  Mentioned that when she went to her PMD this afternoon for her annual physical was told she was in normal rhythm.  She is scheduled for device check next week to check A thresholds.  Tommye Standard, PA-C

## 2019-05-30 ENCOUNTER — Telehealth: Payer: Self-pay | Admitting: Neurology

## 2019-05-30 NOTE — Telephone Encounter (Signed)
Received lab work from primary care dated May 27, 2019.  Sodium was 142, potassium 4.0, chloride 102, CO2 27, BUN 13, glucose 93, AST 16, ALT 14, white blood cells 5.8, hemoglobin was low at 9.6, hematocrit 32.5, platelets 205.  TSH was 1.75.

## 2019-06-02 ENCOUNTER — Ambulatory Visit (INDEPENDENT_AMBULATORY_CARE_PROVIDER_SITE_OTHER): Payer: Medicare Other | Admitting: Student

## 2019-06-02 ENCOUNTER — Encounter: Payer: Medicare Other | Admitting: Student

## 2019-06-02 ENCOUNTER — Other Ambulatory Visit: Payer: Self-pay

## 2019-06-02 DIAGNOSIS — Z950 Presence of cardiac pacemaker: Secondary | ICD-10-CM

## 2019-06-02 DIAGNOSIS — I495 Sick sinus syndrome: Secondary | ICD-10-CM | POA: Diagnosis not present

## 2019-06-02 DIAGNOSIS — I4819 Other persistent atrial fibrillation: Secondary | ICD-10-CM

## 2019-06-02 LAB — CUP PACEART INCLINIC DEVICE CHECK
Battery Remaining Longevity: 31 mo
Battery Voltage: 2.92 V
Brady Statistic AP VP Percent: 8.83 %
Brady Statistic AP VS Percent: 54.9 %
Brady Statistic AS VP Percent: 9.22 %
Brady Statistic AS VS Percent: 27.06 %
Brady Statistic RA Percent Paced: 59.26 %
Brady Statistic RV Percent Paced: 16.96 %
Date Time Interrogation Session: 20210429090421
Implantable Lead Implant Date: 20140917
Implantable Lead Implant Date: 20140917
Implantable Lead Location: 753859
Implantable Lead Location: 753860
Implantable Pulse Generator Implant Date: 20140917
Lead Channel Impedance Value: 304 Ohm
Lead Channel Impedance Value: 342 Ohm
Lead Channel Impedance Value: 342 Ohm
Lead Channel Impedance Value: 437 Ohm
Lead Channel Pacing Threshold Amplitude: 0.75 V
Lead Channel Pacing Threshold Amplitude: 1.125 V
Lead Channel Pacing Threshold Pulse Width: 0.4 ms
Lead Channel Pacing Threshold Pulse Width: 0.4 ms
Lead Channel Sensing Intrinsic Amplitude: 0.875 mV
Lead Channel Sensing Intrinsic Amplitude: 1 mV
Lead Channel Sensing Intrinsic Amplitude: 11.75 mV
Lead Channel Sensing Intrinsic Amplitude: 13.25 mV
Lead Channel Setting Pacing Amplitude: 2 V
Lead Channel Setting Pacing Amplitude: 2.5 V
Lead Channel Setting Pacing Pulse Width: 0.4 ms
Lead Channel Setting Sensing Sensitivity: 0.9 mV

## 2019-06-02 NOTE — Progress Notes (Signed)
Pacemaker check in clinic. Normal device function. Thresholds, sensing, impedances consistent with previous measurements. Known PAF on Eliquis. Recently programmed from VVIR to AAIR <=> DDIR. AF burden 16.3%. Device programmed at appropriate safety margins. Histogram distribution appropriate for patient activity level. Estimated longevity 2 yr 7 mo. Patient enrolled in remote follow-up. Next 6/24. Following AF burden.

## 2019-06-08 DIAGNOSIS — Z85828 Personal history of other malignant neoplasm of skin: Secondary | ICD-10-CM | POA: Diagnosis not present

## 2019-06-08 DIAGNOSIS — L821 Other seborrheic keratosis: Secondary | ICD-10-CM | POA: Diagnosis not present

## 2019-06-08 DIAGNOSIS — L720 Epidermal cyst: Secondary | ICD-10-CM | POA: Diagnosis not present

## 2019-06-08 DIAGNOSIS — L570 Actinic keratosis: Secondary | ICD-10-CM | POA: Diagnosis not present

## 2019-06-16 DIAGNOSIS — H353132 Nonexudative age-related macular degeneration, bilateral, intermediate dry stage: Secondary | ICD-10-CM | POA: Diagnosis not present

## 2019-06-16 DIAGNOSIS — Z961 Presence of intraocular lens: Secondary | ICD-10-CM | POA: Diagnosis not present

## 2019-06-16 DIAGNOSIS — H26493 Other secondary cataract, bilateral: Secondary | ICD-10-CM | POA: Diagnosis not present

## 2019-06-16 DIAGNOSIS — H2513 Age-related nuclear cataract, bilateral: Secondary | ICD-10-CM | POA: Diagnosis not present

## 2019-06-28 ENCOUNTER — Other Ambulatory Visit: Payer: Self-pay

## 2019-06-28 ENCOUNTER — Ambulatory Visit
Admission: RE | Admit: 2019-06-28 | Discharge: 2019-06-28 | Disposition: A | Discharge status: Home / Self Care | Payer: Medicare Other | Source: Ambulatory Visit | Attending: Internal Medicine | Admitting: Internal Medicine

## 2019-06-28 DIAGNOSIS — Z1231 Encounter for screening mammogram for malignant neoplasm of breast: Secondary | ICD-10-CM | POA: Diagnosis not present

## 2019-07-14 DIAGNOSIS — K7689 Other specified diseases of liver: Secondary | ICD-10-CM | POA: Diagnosis not present

## 2019-07-14 DIAGNOSIS — I10 Essential (primary) hypertension: Secondary | ICD-10-CM | POA: Diagnosis not present

## 2019-07-14 DIAGNOSIS — R946 Abnormal results of thyroid function studies: Secondary | ICD-10-CM | POA: Diagnosis not present

## 2019-07-14 DIAGNOSIS — D62 Acute posthemorrhagic anemia: Secondary | ICD-10-CM | POA: Diagnosis not present

## 2019-07-20 ENCOUNTER — Other Ambulatory Visit (HOSPITAL_COMMUNITY): Payer: Self-pay | Admitting: *Deleted

## 2019-07-22 ENCOUNTER — Other Ambulatory Visit: Payer: Self-pay

## 2019-07-22 ENCOUNTER — Ambulatory Visit (HOSPITAL_COMMUNITY)
Admission: RE | Admit: 2019-07-22 | Discharge: 2019-07-22 | Disposition: A | Discharge status: Home / Self Care | Payer: Medicare Other | Source: Ambulatory Visit | Attending: Oncology | Admitting: Oncology

## 2019-07-22 DIAGNOSIS — D649 Anemia, unspecified: Secondary | ICD-10-CM | POA: Insufficient documentation

## 2019-07-22 MED ORDER — SODIUM CHLORIDE 0.9 % IV SOLN
510.0000 mg | Freq: Once | INTRAVENOUS | Status: AC
  Administered 2019-07-22: 510 mg via INTRAVENOUS
  Filled 2019-07-22: qty 17

## 2019-07-28 ENCOUNTER — Ambulatory Visit (INDEPENDENT_AMBULATORY_CARE_PROVIDER_SITE_OTHER): Payer: Medicare Other | Admitting: *Deleted

## 2019-07-28 ENCOUNTER — Other Ambulatory Visit: Payer: Self-pay | Admitting: Neurology

## 2019-07-28 DIAGNOSIS — I495 Sick sinus syndrome: Secondary | ICD-10-CM

## 2019-07-28 NOTE — Telephone Encounter (Signed)
Rx(s) sent to pharmacy electronically.  

## 2019-07-29 ENCOUNTER — Telehealth: Payer: Self-pay

## 2019-07-29 LAB — CUP PACEART REMOTE DEVICE CHECK
Battery Remaining Longevity: 25 mo
Battery Voltage: 2.92 V
Brady Statistic AP VP Percent: 7.68 %
Brady Statistic AP VS Percent: 58.17 %
Brady Statistic AS VP Percent: 1.53 %
Brady Statistic AS VS Percent: 32.62 %
Brady Statistic RA Percent Paced: 64.16 %
Brady Statistic RV Percent Paced: 8.9 %
Date Time Interrogation Session: 20210625121002
Implantable Lead Implant Date: 20140917
Implantable Lead Implant Date: 20140917
Implantable Lead Location: 753859
Implantable Lead Location: 753860
Implantable Pulse Generator Implant Date: 20140917
Lead Channel Impedance Value: 304 Ohm
Lead Channel Impedance Value: 323 Ohm
Lead Channel Impedance Value: 361 Ohm
Lead Channel Impedance Value: 437 Ohm
Lead Channel Pacing Threshold Amplitude: 0.75 V
Lead Channel Pacing Threshold Amplitude: 1 V
Lead Channel Pacing Threshold Pulse Width: 0.4 ms
Lead Channel Pacing Threshold Pulse Width: 0.4 ms
Lead Channel Sensing Intrinsic Amplitude: 0.75 mV
Lead Channel Sensing Intrinsic Amplitude: 0.75 mV
Lead Channel Sensing Intrinsic Amplitude: 13.25 mV
Lead Channel Sensing Intrinsic Amplitude: 13.25 mV
Lead Channel Setting Pacing Amplitude: 2 V
Lead Channel Setting Pacing Amplitude: 2.75 V
Lead Channel Setting Pacing Pulse Width: 0.4 ms
Lead Channel Setting Sensing Sensitivity: 0.9 mV

## 2019-07-29 NOTE — Telephone Encounter (Signed)
Left message for patient to remind of missed remote transmission.  

## 2019-08-01 NOTE — Progress Notes (Signed)
Remote pacemaker transmission.   

## 2019-09-05 ENCOUNTER — Other Ambulatory Visit: Payer: Self-pay | Admitting: Neurology

## 2019-09-06 NOTE — Telephone Encounter (Signed)
Rx(s) sent to pharmacy electronically.  

## 2019-09-15 ENCOUNTER — Other Ambulatory Visit: Payer: Self-pay | Admitting: Neurology

## 2019-09-15 NOTE — Telephone Encounter (Signed)
Patient picked up carbidopa levodopa 25 - 100mg  tablets. Was told that the carbidopa levodopa 50-200mg  tablets were delivered via mail but she never received it. Pharmacy told her it can't be refilled until October. Please call.

## 2019-09-15 NOTE — Telephone Encounter (Signed)
Spoke with pt who says her DIL picked up her Sinemet CR and mailed it to her but it never arrived. Called pharm and they can fill it for 46 days/$23 to cover her until insurance will cover it again on Sept 28th, pt states she will have her DIL pick it up and mail it to her and will get full refill when she can.

## 2019-09-28 DIAGNOSIS — Z23 Encounter for immunization: Secondary | ICD-10-CM | POA: Diagnosis not present

## 2019-10-13 ENCOUNTER — Ambulatory Visit: Payer: Medicare Other | Admitting: Neurology

## 2019-10-17 ENCOUNTER — Other Ambulatory Visit: Payer: Self-pay | Admitting: Gastroenterology

## 2019-10-17 ENCOUNTER — Telehealth: Payer: Self-pay | Admitting: *Deleted

## 2019-10-17 DIAGNOSIS — K7400 Hepatic fibrosis, unspecified: Secondary | ICD-10-CM | POA: Diagnosis not present

## 2019-10-17 MED ORDER — APIXABAN 5 MG PO TABS
5.0000 mg | ORAL_TABLET | Freq: Two times a day (BID) | ORAL | 5 refills | Status: DC
Start: 2019-10-17 — End: 2020-06-20

## 2019-10-17 NOTE — Telephone Encounter (Signed)
   [facility] Pre-operative Risk Assessment    HEARTCARE STAFF: - Please ensure there is not already an duplicate clearance open for this procedure. - Under Visit Info/Reason for Call, type in Other and utilize the format Clearance MM/DD/YY or Clearance TBD. Do not use dashes or single digits. - If request is for dental extraction, please clarify the # of teeth to be extracted.  Request for surgical clearance:  1. What type of surgery is being performed?  ENDOSCOPY   2. When is this surgery scheduled?  11/08/19   3. What type of clearance is required (medical clearance vs. Pharmacy clearance to hold med vs. Both)?  BOTH  4. Are there any medications that need to be held prior to surgery and how long? ELIQUIS MORNING OF    5. Practice name and name of physician performing surgery?  [facility] / DR. Si Gaul   6. What is the office phone number?  [phone]   7.   What is the office fax number?  [phone]  8.   Anesthesia type (None, local, MAC, general) ?     Jeanann Lewandowsky 10/17/2019, 1:23 PM  _________________________________________________________________   (provider comments below)

## 2019-10-17 NOTE — Telephone Encounter (Signed)
Clinical pharmacist to review Eliquis 

## 2019-10-17 NOTE — Telephone Encounter (Signed)
Patient with diagnosis of atrial fibrillation on Eliquis for anticoagulation.    Procedure: Endoscopy Date of procedure: 11/08/19  CHADS2-VASc score of 5 (CHF, HTN, AGEx2, female)  CrCl 54 ml/min Platelet count 205   OK to hold Eliquis 1 day prior to procedure.

## 2019-10-18 ENCOUNTER — Ambulatory Visit
Admission: RE | Admit: 2019-10-18 | Discharge: 2019-10-18 | Disposition: A | Discharge status: Home / Self Care | Payer: Medicare Other | Source: Ambulatory Visit | Attending: Gastroenterology | Admitting: Gastroenterology

## 2019-10-18 DIAGNOSIS — K219 Gastro-esophageal reflux disease without esophagitis: Secondary | ICD-10-CM | POA: Diagnosis not present

## 2019-10-18 DIAGNOSIS — M810 Age-related osteoporosis without current pathological fracture: Secondary | ICD-10-CM | POA: Diagnosis not present

## 2019-10-18 DIAGNOSIS — K7400 Hepatic fibrosis, unspecified: Secondary | ICD-10-CM

## 2019-10-18 DIAGNOSIS — G20 Parkinson's disease: Secondary | ICD-10-CM | POA: Diagnosis not present

## 2019-10-18 DIAGNOSIS — I480 Paroxysmal atrial fibrillation: Secondary | ICD-10-CM | POA: Diagnosis not present

## 2019-10-18 DIAGNOSIS — I10 Essential (primary) hypertension: Secondary | ICD-10-CM | POA: Diagnosis not present

## 2019-10-18 DIAGNOSIS — D649 Anemia, unspecified: Secondary | ICD-10-CM | POA: Diagnosis not present

## 2019-10-18 DIAGNOSIS — R7301 Impaired fasting glucose: Secondary | ICD-10-CM | POA: Diagnosis not present

## 2019-10-18 DIAGNOSIS — E538 Deficiency of other specified B group vitamins: Secondary | ICD-10-CM | POA: Diagnosis not present

## 2019-10-18 DIAGNOSIS — E039 Hypothyroidism, unspecified: Secondary | ICD-10-CM | POA: Diagnosis not present

## 2019-10-18 DIAGNOSIS — I509 Heart failure, unspecified: Secondary | ICD-10-CM | POA: Diagnosis not present

## 2019-10-18 DIAGNOSIS — D6869 Other thrombophilia: Secondary | ICD-10-CM | POA: Diagnosis not present

## 2019-10-18 NOTE — Telephone Encounter (Signed)
Talking with [name] [name], she has been noticing increase DOE and rare episode of chest tightness. Will recommend office visit prior to clearance. She has a upcoming visit to see Dr. Rayann Heman on 9/29. Callback pool to change the reason behind the office visit to include preop clearance.   Will defer to MD for final clearance.

## 2019-10-19 NOTE — Telephone Encounter (Signed)
Pt has office visit 11/02/19 and clearance will be addressed at that time. Will route back to requesting surgeon's office to make them aware.

## 2019-10-21 ENCOUNTER — Other Ambulatory Visit: Payer: Self-pay | Admitting: Gastroenterology

## 2019-10-21 DIAGNOSIS — R935 Abnormal findings on diagnostic imaging of other abdominal regions, including retroperitoneum: Secondary | ICD-10-CM

## 2019-10-24 ENCOUNTER — Telehealth: Payer: Self-pay | Admitting: *Deleted

## 2019-10-24 DIAGNOSIS — K7400 Hepatic fibrosis, unspecified: Secondary | ICD-10-CM | POA: Diagnosis not present

## 2019-10-24 NOTE — Telephone Encounter (Signed)
Returned call to Dr. Cristina Gong.  Explained patient's Medtronic PPM is MRI-conditional and that MRI will likely need to be ordered at [facility].  Dr. Cristina Gong verbalized understanding and appreciation of call back.

## 2019-10-24 NOTE — Telephone Encounter (Signed)
Dr. Cristina Gong called the office and asked that I call him back that he had questions regarding the patient.   Spoke with Dr. Cristina Gong and he was asking if the patient can have an MRI with the type of device she has in place. I advised that I would reach out to our device clinic here in the office and they would get back to him.

## 2019-10-25 ENCOUNTER — Other Ambulatory Visit (HOSPITAL_COMMUNITY): Payer: Self-pay | Admitting: Gastroenterology

## 2019-10-25 DIAGNOSIS — R935 Abnormal findings on diagnostic imaging of other abdominal regions, including retroperitoneum: Secondary | ICD-10-CM

## 2019-11-02 ENCOUNTER — Ambulatory Visit (INDEPENDENT_AMBULATORY_CARE_PROVIDER_SITE_OTHER): Payer: Medicare Other | Admitting: Internal Medicine

## 2019-11-02 ENCOUNTER — Other Ambulatory Visit: Payer: Self-pay

## 2019-11-02 ENCOUNTER — Encounter: Payer: Self-pay | Admitting: Internal Medicine

## 2019-11-02 VITALS — BP 124/82 | HR 82 | Ht 62.0 in | Wt 141.0 lb

## 2019-11-02 DIAGNOSIS — I4819 Other persistent atrial fibrillation: Secondary | ICD-10-CM

## 2019-11-02 DIAGNOSIS — I495 Sick sinus syndrome: Secondary | ICD-10-CM | POA: Diagnosis not present

## 2019-11-02 DIAGNOSIS — R0602 Shortness of breath: Secondary | ICD-10-CM

## 2019-11-02 DIAGNOSIS — I10 Essential (primary) hypertension: Secondary | ICD-10-CM

## 2019-11-02 LAB — CUP PACEART INCLINIC DEVICE CHECK
Battery Remaining Longevity: 19 mo
Battery Voltage: 2.91 V
Brady Statistic AP VP Percent: 9.3 %
Brady Statistic AP VS Percent: 53.55 %
Brady Statistic AS VP Percent: 4.01 %
Brady Statistic AS VS Percent: 33.14 %
Brady Statistic RA Percent Paced: 61.39 %
Brady Statistic RV Percent Paced: 12.97 %
Date Time Interrogation Session: 20210929095458
Implantable Lead Implant Date: 20140917
Implantable Lead Implant Date: 20140917
Implantable Lead Location: 753859
Implantable Lead Location: 753860
Implantable Pulse Generator Implant Date: 20140917
Lead Channel Impedance Value: 323 Ohm
Lead Channel Impedance Value: 342 Ohm
Lead Channel Impedance Value: 361 Ohm
Lead Channel Impedance Value: 456 Ohm
Lead Channel Pacing Threshold Amplitude: 0.75 V
Lead Channel Pacing Threshold Amplitude: 1 V
Lead Channel Pacing Threshold Pulse Width: 0.4 ms
Lead Channel Pacing Threshold Pulse Width: 0.4 ms
Lead Channel Sensing Intrinsic Amplitude: 0.75 mV
Lead Channel Sensing Intrinsic Amplitude: 0.875 mV
Lead Channel Sensing Intrinsic Amplitude: 12.75 mV
Lead Channel Sensing Intrinsic Amplitude: 13.625 mV
Lead Channel Setting Pacing Amplitude: 2 V
Lead Channel Setting Pacing Amplitude: 2.5 V
Lead Channel Setting Pacing Pulse Width: 0.4 ms
Lead Channel Setting Sensing Sensitivity: 0.9 mV

## 2019-11-02 NOTE — Patient Instructions (Addendum)
Medication Instructions:  Your physician recommends that you continue on your current medications as directed. Please refer to the Current Medication list given to you today.  *If you need a refill on your cardiac medications before your next appointment, please call your pharmacy*  Lab Work: None ordered.  If you have labs (blood work) drawn today and your tests are completely normal, you will receive your results only by: [name] MyChart Message (if you have MyChart) OR . A paper copy in the mail If you have any lab test that is abnormal or we need to change your treatment, we will call you to review the results.  Testing/Procedures: Please schedule Echo and Myoview Your physician has requested that you have an echocardiogram. Echocardiography is a painless test that uses sound waves to create images of your heart. It provides your doctor with information about the size and shape of your heart and how well your heart's chambers and valves are working. This procedure takes approximately one hour. There are no restrictions for this procedure. Your physician has requested that you have a lexiscan myoview. For further information please visit [email or web address]. Please follow instruction sheet, as given.     Follow-Up: At [facility], you and your health needs are our priority.  As part of our continuing mission to provide you with exceptional heart care, we have created designated Provider Care Teams.  These Care Teams include your primary Cardiologist (physician) and Advanced Practice Providers (APPs -  Physician Assistants and Nurse Practitioners) who all work together to provide you with the care you need, when you need it.  We recommend signing up for the patient portal called "MyChart".  Sign up information is provided on this After Visit Summary.  MyChart is used to connect with patients for Virtual Visits (Telemedicine).  Patients are able to view lab/test results, encounter notes,  upcoming appointments, etc.  Non-urgent messages can be sent to your provider as well.   To learn more about what you can do with MyChart, go to [email or web address].    Your next appointment:   Your physician wants you to follow-up in: 1 year with [name] [name].    Other Instructions:

## 2019-11-02 NOTE — Progress Notes (Signed)
PCP: Crist Infante, MD   Primary EP:  Dr Rayann Heman  [name] [name] is a 79 y.o. female who presents today for routine electrophysiology followup.  Since last being seen in our clinic, the patient reports doing very well.  She has SOB with walking on inclines.  She has has rare and fleeting sharp chest pains.  Today, she denies symptoms of palpitations, exertional chest pain,  lower extremity edema, dizziness, presyncope, or syncope.  The patient is otherwise without complaint today.   Past Medical History:  Diagnosis Date  . Anemia   . Arthritis    thumbs  . Atrial tachycardia ([place])   . Atypical atrial flutter ([place])   . Cataract    bilateral  . Chronic anticoagulation    on Xarelto  . Chronic diastolic CHF (congestive heart failure) (HCC)    Echocardiogram (11/24/12): Mild LVH, EF 60%.  . Dysrhythmia   . GERD (gastroesophageal reflux disease)    hx of no meds  . Heart murmur    slight  . Hyperlipidemia    a. Hx of leg weakness while on statin. under control  . Hypertension   . Hypothyroidism   . Leg fracture Dec 21,2011  . Osteoporosis 07/2007  . Parkinson's disease ([place]) 2011  . Persistent atrial fibrillation (HCC)    s/p atrial fib ablation 11-11-11.   [name] PONV (postoperative nausea and vomiting)   . Presence of permanent cardiac pacemaker    permanent Medtronic  . Shortness of breath    "when walking at incline or stairs"  . Sinus brady-tachy syndrome ([place])   . Squamous carcinoma summer 2015   back of left thigh  . Wrist fracture    right   Past Surgical History:  Procedure Laterality Date  . ATRIAL FIBRILLATION ABLATION N/A 11/11/2011   Procedure: ATRIAL FIBRILLATION ABLATION;  Surgeon: Thompson Grayer, MD;  Location: [facility] CATH LAB;  Service: Cardiovascular;  Laterality: N/A;  . BREAST BIOPSY  1998  . BREAST SURGERY     benign cyst removed  . CARDIAC ELECTROPHYSIOLOGY STUDY AND ABLATION  5/14, 7/14   convergent AF ablation at [facility] and subsequent atypical atrial  flutter ablation by Dr Lehman Prom  . CARDIOVASCULAR STRESS TEST  06-06-2008   EF 73%  . CARDIOVERSION  09/09/2011   Procedure: CARDIOVERSION;  Surgeon: Darlin Coco, MD;  Location: [facility] ENDOSCOPY;  Service: Cardiovascular;  Laterality: N/A;  to be done by dr. Mare Ferrari  . CARDIOVERSION  02/18/2012   Procedure: CARDIOVERSION;  Surgeon: Thayer Headings, MD;  Location: [place];  Service: Cardiovascular;  Laterality: N/A;  . CHOLECYSTECTOMY  1995  . FEMUR FRACTURE SURGERY  2011   metal pin in place  . FOREARM SURGERY Left 2010   "opened arm to drain it"  . INSERTION OF MESH N/A 11/24/2013   Procedure: INSERTION OF MESH;  Surgeon: Michael Boston, MD;  Location: [facility] ORS;  Service: General;  Laterality: N/A;  . KNEE SURGERY Left 2001  . Left unicompartmental knee     Dr. Alvan Dame 11/18/16  . PACEMAKER PLACEMENT  10/20/12   MDT Advisa DR implanted by Dr Lehman Prom at [facility]   . PARTIAL KNEE ARTHROPLASTY Left 11/18/2016   Procedure: LEFT UNICOMPARTMENTAL KNEE Medially;  Surgeon: Paralee Cancel, MD;  Location: [facility] ORS;  Service: Orthopedics;  Laterality: Left;  90 mins  . SKIN CANCER DESTRUCTION  summer 2015  . TEE WITHOUT CARDIOVERSION  11/10/2011   Procedure: TRANSESOPHAGEAL ECHOCARDIOGRAM (TEE);  Surgeon: Thayer Headings, MD;  Location: [facility]  ENDOSCOPY;  Service: Cardiovascular;  Laterality: N/A;  . TONSILLECTOMY  as child  . US ECHOCARDIOGRAPHY  03-10-2008   Est EF 55-60%, Dr. Cathie Olden  . VENTRAL HERNIA REPAIR N/A 11/24/2013   Procedure: LAPAROSCOPIC VENTRAL HERNIA;  Surgeon: Michael Boston, MD;  Location: [facility]S;  Service: General;  Laterality: N/A;  . WRIST SURGERY Right ~2009   metal rod in place    ROS- all systems are reviewed and negative except as per HPI above  Current Outpatient Medications  Medication Sig Dispense Refill  . apixaban (ELIQUIS) 5 MG TABS tablet Take 1 tablet (5 mg total) by mouth 2 (two) times daily. 60 tablet 5  . CALCIUM PO Take 1 tablet by mouth at bedtime.     . Calcium  Polycarbophil (FIBERCON PO) Take 2 tablets by mouth every morning.     . carbidopa-levodopa (SINEMET CR) 50-200 MG tablet TAKE 1 TABLET BY MOUTH EVERYDAY AT BEDTIME 90 tablet 2  . carbidopa-levodopa (SINEMET IR) 25-100 MG tablet TAKE 1 & 1/2 TABLET 3 TIMES A DAY 360 tablet 1  . clonazePAM (KLONOPIN) 0.5 MG tablet Take 0.5 tablets (0.25 mg total) by mouth at bedtime. 45 tablet 1  . Coenzyme Q10 (CO Q 10 PO) Take 1 tablet by mouth every morning.     . cyanocobalamin (,VITAMIN B-12,) 1000 MCG/ML injection INJECT 1ML INTRAMUSCULARLY IN THE DELTOID ONCE A MONTH (ALTERNATING DELT. EACH MONTH)    . cyanocobalamin 100 MCG tablet Take 100 mcg by mouth daily.    Mariane Baumgarten Sodium (COLACE PO) Take 1 capsule by mouth 2 (two) times daily as needed (constipation).     . ezetimibe (ZETIA) 10 MG tablet Take 10 mg by mouth every other day. Taking the same day as the crestor    . furosemide (LASIX) 40 MG tablet Take 1 tablet (40 mg total) by mouth 2 (two) times daily. 60 tablet 10  . levothyroxine (SYNTHROID) 50 MCG tablet Take 50 mcg by mouth daily.    . Magnesium Oxide 500 MG TABS Take by mouth.    . metoprolol succinate (TOPROL-XL) 25 MG 24 hr tablet TAKE 1 TABLET BY MOUTH DAILY. PLEASE MAKE OVERDUE APPT WITH DR. Arnol Mcgibbon BEFORE ANYMORE REFILLS. 1ST ATTEMPT 90 tablet 3  . multivitamin-lutein (OCUVITE-LUTEIN) CAPS capsule Take 1 capsule by mouth daily.    [name] OVER THE COUNTER MEDICATION CBD oil    . polyethylene glycol (MIRALAX / GLYCOLAX) packet Take 17 g by mouth 2 (two) times daily. (Patient taking differently: Take 17 g by mouth daily as needed. ) 14 each 0  . potassium chloride (K-DUR,KLOR-CON) 10 MEQ tablet Take 10 mEq by mouth 2 (two) times daily.     . raloxifene (EVISTA) 60 MG tablet Take 60 mg by mouth daily.  3  . rasagiline (AZILECT) 1 MG TABS tablet TAKE 1 TABLET BY MOUTH EVERY DAY 90 tablet 2  . rosuvastatin (CRESTOR) 5 MG tablet Take 2.5 mg by mouth every other day.    . valACYclovir (VALTREX) 1000 MG  tablet Take 1,000 mg by mouth 2 (two) times daily as needed (fever blisters).    [name] VITAMIN D, ERGOCALCIFEROL, PO Take 1,000 Units by mouth daily.      No current facility-administered medications for this visit.    Physical Exam: Vitals:   11/02/19 0916  BP: 124/82  Pulse: 82  SpO2: 96%  Weight: 141 lb (64 kg)  Height: 5\' 2"  (1.575 m)    GEN- The patient is well appearing, alert and oriented x  3 today.   Head- normocephalic, atraumatic Eyes-  Sclera clear, conjunctiva pink Ears- hearing intact Oropharynx- clear Lungs- Clear to ausculation bilaterally, normal work of breathing Chest- pacemaker pocket is well healed Heart- Regular rate and rhythm, no murmurs, rubs or gallops, PMI not laterally displaced GI- soft, NT, ND, + BS Extremities- no clubbing, cyanosis, or edema  Pacemaker interrogation- reviewed in detail today,  See PACEART report  ekg tracing ordered today is personally reviewed and shows sinus with first degree AV block  Assessment and Plan:  1. Symptomatic bradycardia  Normal pacemaker function See Pace Art report No changes today she is not device dependant today  2. Longstanding persistent afib Now back in sinus afib burden is 4 % Rate controlled anticoagulated chads2vasc score is 5.  She is on eliquis  3. HTN Stable No change required today  4. Chronic diastolic dysfunction Stable No change required today  5. SOB/ atypical chest pain/ preop clearance Echo myoview If low risk, ok to proceed with endoscopy   Risks, benefits and potential toxicities for medications prescribed and/or refilled reviewed with patient today.   Return to see EP PA in a year  Thompson Grayer MD, [facility] 11/02/2019 9:20 AM

## 2019-11-03 ENCOUNTER — Ambulatory Visit (HOSPITAL_COMMUNITY)
Admission: RE | Admit: 2019-11-03 | Discharge: 2019-11-03 | Disposition: A | Discharge status: Home / Self Care | Payer: Medicare Other | Source: Ambulatory Visit | Attending: Gastroenterology | Admitting: Gastroenterology

## 2019-11-03 ENCOUNTER — Other Ambulatory Visit (HOSPITAL_COMMUNITY): Payer: Self-pay | Admitting: Gastroenterology

## 2019-11-03 DIAGNOSIS — R935 Abnormal findings on diagnostic imaging of other abdominal regions, including retroperitoneum: Secondary | ICD-10-CM | POA: Diagnosis not present

## 2019-11-03 DIAGNOSIS — K7689 Other specified diseases of liver: Secondary | ICD-10-CM | POA: Diagnosis not present

## 2019-11-03 MED ORDER — GADOBUTROL 1 MMOL/ML IV SOLN
7.0000 mL | Freq: Once | INTRAVENOUS | Status: AC | PRN
  Administered 2019-11-03: 7 mL via INTRAVENOUS

## 2019-11-03 NOTE — Progress Notes (Signed)
Patient did not show up for appointment. She will need to reschedule if she does now arrive late.

## 2019-11-03 NOTE — Progress Notes (Signed)
Patient re-programmed to original settings. carelink express sent. Ambulatory at discharge

## 2019-11-03 NOTE — Progress Notes (Signed)
Patient here today for MRI of abdomen. Patient has medtronic pacer. Carelink express sent and orders received to program patient DOO rate of 90

## 2019-11-09 ENCOUNTER — Telehealth (HOSPITAL_COMMUNITY): Payer: Self-pay | Admitting: *Deleted

## 2019-11-09 ENCOUNTER — Encounter (HOSPITAL_COMMUNITY): Payer: Self-pay | Admitting: *Deleted

## 2019-11-09 NOTE — Telephone Encounter (Signed)
Patient given detailed instructions per Myocardial Perfusion Study Information Sheet for the test on 11/16/2019 at 0715. Patient notified to arrive 15 minutes early and that it is imperative to arrive on time for appointment to keep from having the test rescheduled.  If you need to cancel or reschedule your appointment, please call the office within 24 hours of your appointment. . Patient verbalized understanding.[name] [name], [name] Letter by [employer] sent with instructions.

## 2019-11-16 ENCOUNTER — Ambulatory Visit (HOSPITAL_BASED_OUTPATIENT_CLINIC_OR_DEPARTMENT_OTHER): Payer: Medicare Other

## 2019-11-16 ENCOUNTER — Other Ambulatory Visit: Payer: Self-pay

## 2019-11-16 ENCOUNTER — Ambulatory Visit (HOSPITAL_COMMUNITY): Payer: Medicare Other | Attending: Cardiovascular Disease

## 2019-11-16 DIAGNOSIS — R0602 Shortness of breath: Secondary | ICD-10-CM

## 2019-11-16 DIAGNOSIS — I4819 Other persistent atrial fibrillation: Secondary | ICD-10-CM | POA: Diagnosis not present

## 2019-11-16 DIAGNOSIS — I10 Essential (primary) hypertension: Secondary | ICD-10-CM

## 2019-11-16 DIAGNOSIS — I495 Sick sinus syndrome: Secondary | ICD-10-CM

## 2019-11-16 LAB — ECHOCARDIOGRAM COMPLETE
Area-P 1/2: 3.64 cm2
Height: 62 in
S' Lateral: 2.5 cm
Weight: 2256 oz

## 2019-11-16 LAB — MYOCARDIAL PERFUSION IMAGING
LV dias vol: 63 mL (ref 46–106)
LV sys vol: 18 mL
Peak HR: 87 {beats}/min
Rest HR: 103 {beats}/min
SDS: 1
SRS: 0
SSS: 1
TID: 1

## 2019-11-16 MED ORDER — REGADENOSON 0.4 MG/5ML IV SOLN
0.4000 mg | Freq: Once | INTRAVENOUS | Status: AC
  Administered 2019-11-16: 0.4 mg via INTRAVENOUS

## 2019-11-16 MED ORDER — TECHNETIUM TC 99M TETROFOSMIN IV KIT
32.6000 | PACK | Freq: Once | INTRAVENOUS | Status: AC | PRN
  Administered 2019-11-16: 32.6 via INTRAVENOUS
  Filled 2019-11-16: qty 33

## 2019-11-16 MED ORDER — TECHNETIUM TC 99M TETROFOSMIN IV KIT
10.6000 | PACK | Freq: Once | INTRAVENOUS | Status: AC | PRN
  Administered 2019-11-16: 10.6 via INTRAVENOUS
  Filled 2019-11-16: qty 11

## 2019-11-23 DIAGNOSIS — Z23 Encounter for immunization: Secondary | ICD-10-CM | POA: Diagnosis not present

## 2019-11-29 DIAGNOSIS — S76011A Strain of muscle, fascia and tendon of right hip, initial encounter: Secondary | ICD-10-CM | POA: Diagnosis not present

## 2019-11-30 DIAGNOSIS — Z01812 Encounter for preprocedural laboratory examination: Secondary | ICD-10-CM | POA: Diagnosis not present

## 2019-12-02 DIAGNOSIS — K7460 Unspecified cirrhosis of liver: Secondary | ICD-10-CM | POA: Diagnosis not present

## 2019-12-02 DIAGNOSIS — K2970 Gastritis, unspecified, without bleeding: Secondary | ICD-10-CM | POA: Diagnosis not present

## 2019-12-13 DIAGNOSIS — I10 Essential (primary) hypertension: Secondary | ICD-10-CM | POA: Diagnosis not present

## 2019-12-13 DIAGNOSIS — D649 Anemia, unspecified: Secondary | ICD-10-CM | POA: Diagnosis not present

## 2020-01-09 ENCOUNTER — Other Ambulatory Visit (HOSPITAL_COMMUNITY): Payer: Self-pay | Admitting: *Deleted

## 2020-01-10 ENCOUNTER — Encounter (HOSPITAL_COMMUNITY)
Admission: RE | Admit: 2020-01-10 | Discharge: 2020-01-10 | Disposition: A | Discharge status: Home / Self Care | Payer: Medicare Other | Source: Ambulatory Visit | Attending: Internal Medicine | Admitting: Internal Medicine

## 2020-01-10 DIAGNOSIS — D649 Anemia, unspecified: Secondary | ICD-10-CM | POA: Insufficient documentation

## 2020-01-10 MED ORDER — SODIUM CHLORIDE 0.9 % IV SOLN
510.0000 mg | INTRAVENOUS | Status: DC
  Administered 2020-01-10: 510 mg via INTRAVENOUS
  Filled 2020-01-10: qty 510

## 2020-01-17 ENCOUNTER — Other Ambulatory Visit: Payer: Self-pay

## 2020-01-17 ENCOUNTER — Encounter (HOSPITAL_COMMUNITY)
Admission: RE | Admit: 2020-01-17 | Discharge: 2020-01-17 | Disposition: A | Discharge status: Home / Self Care | Payer: Medicare Other | Source: Ambulatory Visit | Attending: Internal Medicine | Admitting: Internal Medicine

## 2020-01-17 DIAGNOSIS — D649 Anemia, unspecified: Secondary | ICD-10-CM | POA: Diagnosis not present

## 2020-01-17 MED ORDER — SODIUM CHLORIDE 0.9 % IV SOLN
510.0000 mg | INTRAVENOUS | Status: DC
  Administered 2020-01-17: 11:00:00 510 mg via INTRAVENOUS
  Filled 2020-01-17: qty 17

## 2020-02-01 ENCOUNTER — Ambulatory Visit (INDEPENDENT_AMBULATORY_CARE_PROVIDER_SITE_OTHER): Payer: Medicare Other

## 2020-02-01 ENCOUNTER — Other Ambulatory Visit: Payer: Self-pay | Admitting: Internal Medicine

## 2020-02-01 ENCOUNTER — Other Ambulatory Visit: Payer: Self-pay | Admitting: Neurology

## 2020-02-01 DIAGNOSIS — I495 Sick sinus syndrome: Secondary | ICD-10-CM | POA: Diagnosis not present

## 2020-02-02 LAB — CUP PACEART REMOTE DEVICE CHECK
Battery Remaining Longevity: 16 mo
Battery Voltage: 2.9 V
Brady Statistic AP VP Percent: 9.13 %
Brady Statistic AP VS Percent: 45.78 %
Brady Statistic AS VP Percent: 4.24 %
Brady Statistic AS VS Percent: 40.85 %
Brady Statistic RA Percent Paced: 53.69 %
Brady Statistic RV Percent Paced: 13.06 %
Date Time Interrogation Session: 20211230100052
Implantable Lead Implant Date: 20140917
Implantable Lead Implant Date: 20140917
Implantable Lead Location: 753859
Implantable Lead Location: 753860
Implantable Pulse Generator Implant Date: 20140917
Lead Channel Impedance Value: 304 Ohm
Lead Channel Impedance Value: 323 Ohm
Lead Channel Impedance Value: 342 Ohm
Lead Channel Impedance Value: 437 Ohm
Lead Channel Pacing Threshold Amplitude: 0.75 V
Lead Channel Pacing Threshold Amplitude: 1.125 V
Lead Channel Pacing Threshold Pulse Width: 0.4 ms
Lead Channel Pacing Threshold Pulse Width: 0.4 ms
Lead Channel Sensing Intrinsic Amplitude: 0.625 mV
Lead Channel Sensing Intrinsic Amplitude: 0.625 mV
Lead Channel Sensing Intrinsic Amplitude: 11.875 mV
Lead Channel Sensing Intrinsic Amplitude: 11.875 mV
Lead Channel Setting Pacing Amplitude: 2 V
Lead Channel Setting Pacing Amplitude: 2.5 V
Lead Channel Setting Pacing Pulse Width: 0.4 ms
Lead Channel Setting Sensing Sensitivity: 0.9 mV

## 2020-02-16 NOTE — Progress Notes (Signed)
Remote pacemaker transmission.   

## 2020-02-21 DIAGNOSIS — E785 Hyperlipidemia, unspecified: Secondary | ICD-10-CM | POA: Diagnosis not present

## 2020-02-21 DIAGNOSIS — R7301 Impaired fasting glucose: Secondary | ICD-10-CM | POA: Diagnosis not present

## 2020-02-21 DIAGNOSIS — G20 Parkinson's disease: Secondary | ICD-10-CM | POA: Diagnosis not present

## 2020-02-21 DIAGNOSIS — I480 Paroxysmal atrial fibrillation: Secondary | ICD-10-CM | POA: Diagnosis not present

## 2020-02-21 DIAGNOSIS — E538 Deficiency of other specified B group vitamins: Secondary | ICD-10-CM | POA: Diagnosis not present

## 2020-02-21 DIAGNOSIS — D6869 Other thrombophilia: Secondary | ICD-10-CM | POA: Diagnosis not present

## 2020-02-21 DIAGNOSIS — M810 Age-related osteoporosis without current pathological fracture: Secondary | ICD-10-CM | POA: Diagnosis not present

## 2020-02-21 DIAGNOSIS — I10 Essential (primary) hypertension: Secondary | ICD-10-CM | POA: Diagnosis not present

## 2020-02-21 DIAGNOSIS — E039 Hypothyroidism, unspecified: Secondary | ICD-10-CM | POA: Diagnosis not present

## 2020-02-21 DIAGNOSIS — D649 Anemia, unspecified: Secondary | ICD-10-CM | POA: Diagnosis not present

## 2020-02-21 DIAGNOSIS — I509 Heart failure, unspecified: Secondary | ICD-10-CM | POA: Diagnosis not present

## 2020-02-21 DIAGNOSIS — R7989 Other specified abnormal findings of blood chemistry: Secondary | ICD-10-CM | POA: Diagnosis not present

## 2020-03-13 NOTE — Progress Notes (Signed)
Assessment/Plan:   1.  Parkinsons Disease  -Continue carbidopa/levodopa 25/100, 1.5 tablets 3 times per day.  Pt often only taking bid and encouraged her to take tid at regular interval timing.  -Continue carbidopa/levodopa 50/200 at bedtime  -Continue rasagiline, 1 mg daily  -increase exercise!  -has some Parkinsons Disease dyskinesia on the L but she doesn't notice it and not bothersome.  No tx required. 2.  RBD/insomnia  -On clonazepam, 0.5 mg, half tablet at bedtime. 3.  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 4.  Atrial fibrillation  -Follows with cardiology.  On Eliquis.  Subjective:   [name] [name] was seen today in follow up for Parkinsons disease.  My previous records were reviewed prior to todays visit as well as outside records available to me.  Patient has not been seen in a year (and that visit was video).  She had a September appointment but canceled it.  Pt with one fall 6 weeks ago - hit knee - didn't get hurt.  Pt denies lightheadedness, near syncope.  No hallucinations.  Mood has been good.  Last saw cardiology in September.  Those notes are reviewed.  Seeing functional med in [facility] and taking estrogen and progesterone cream.    Current prescribed movement disorder medications: Carbidopa/levodopa 25/100, 1.5 tablet 3 times per day (admits she is only taking bid) - takes first one 7am-10am and then the next at 1:30 Carbidopa/levodopa 50/200 at bedtime azilect Klonopin, 1/2 po q hs (PDMP indicates last filled June, 2021 for 80-month supply) - takes prn sleep    ALLERGIES:   Allergies  Allergen Reactions  . Statins     Leg weakness - but tolerating low dose Crestor every other day    CURRENT MEDICATIONS:  Outpatient Encounter Medications as of 03/19/2020  Medication Sig  . apixaban (ELIQUIS) 5 MG TABS tablet Take 1 tablet (5 mg total) by mouth 2 (two) times daily.  [name] CALCIUM PO Take 1  tablet by mouth at bedtime.  . Calcium Polycarbophil (FIBERCON PO) Take 2 tablets by mouth every morning.  . carbidopa-levodopa (SINEMET CR) 50-200 MG tablet TAKE 1 TABLET BY MOUTH EVERYDAY AT BEDTIME  . carbidopa-levodopa (SINEMET IR) 25-100 MG tablet TAKE 1 & 1/2 TABLET 3 TIMES A DAY (Patient taking differently: Taking bid)  . clonazePAM (KLONOPIN) 0.5 MG tablet Take 0.5 tablets (0.25 mg total) by mouth at bedtime.  . Coenzyme Q10 (CO Q 10 PO) Take 1 tablet by mouth every morning.  . cyanocobalamin (,VITAMIN B-12,) 1000 MCG/ML injection INJECT 1ML INTRAMUSCULARLY IN THE DELTOID ONCE A MONTH (ALTERNATING DELT. EACH MONTH)  . cyanocobalamin 100 MCG tablet Take 100 mcg by mouth daily.  Mariane Baumgarten Sodium (COLACE PO) Take 1 capsule by mouth 2 (two) times daily as needed (constipation).  . ezetimibe (ZETIA) 10 MG tablet Take 10 mg by mouth every other day. Taking the same day as the crestor  . furosemide (LASIX) 40 MG tablet TAKE 1 TABLET BY MOUTH TWICE A DAY  . levothyroxine (SYNTHROID) 50 MCG tablet Take 50 mcg by mouth daily.  . Magnesium Oxide 500 MG TABS Take by mouth daily.  . metoprolol succinate (TOPROL-XL) 25 MG 24 hr tablet TAKE 1 TABLET BY MOUTH DAILY. PLEASE MAKE OVERDUE APPT WITH DR. ALLRED BEFORE ANYMORE REFILLS.  [name] multivitamin-lutein (OCUVITE-LUTEIN) CAPS capsule Take 1 capsule by mouth daily.  . polyethylene glycol (MIRALAX / GLYCOLAX) packet Take 17 g by mouth 2 (  two) times daily. (Patient taking differently: Take 17 g by mouth daily as needed.)  . potassium chloride (K-DUR,KLOR-CON) 10 MEQ tablet Take 10 mEq by mouth 2 (two) times daily.   . raloxifene (EVISTA) 60 MG tablet Take 60 mg by mouth daily.  . rasagiline (AZILECT) 1 MG TABS tablet TAKE 1 TABLET BY MOUTH EVERY DAY  . rosuvastatin (CRESTOR) 5 MG tablet Take 2.5 mg by mouth every other day.  . valACYclovir (VALTREX) 1000 MG tablet Take 1,000 mg by mouth 2 (two) times daily as needed (fever blisters).  [name] VITAMIN D,  ERGOCALCIFEROL, PO Take 1,000 Units by mouth daily.   [name] PROGESTERONE PO Take 25 mg by mouth at bedtime.  . [DISCONTINUED] carbidopa-levodopa (SINEMET CR) 50-200 MG tablet TAKE 1 TABLET BY MOUTH EVERYDAY AT BEDTIME  . [DISCONTINUED] carbidopa-levodopa (SINEMET IR) 25-100 MG tablet TAKE 1 & 1/2 TABLET 3 TIMES A DAY  . [DISCONTINUED] OVER THE COUNTER MEDICATION CBD oil (Patient not taking: Reported on 03/19/2020)   No facility-administered encounter medications on file as of 03/19/2020.    Objective:   PHYSICAL EXAMINATION:    VITALS:   Vitals:   03/19/20 1110  BP: 104/70  Pulse: 80  SpO2: 98%  Weight: 135 lb (61.2 kg)  Height: 5' 1.5" (1.562 m)    GEN:  The patient appears stated age and is in NAD. HEENT:  Normocephalic, atraumatic.  The mucous membranes are moist. The superficial temporal arteries are without ropiness or tenderness. CV:  RRR Lungs:  CTAB Neck/HEME:  There are no carotid bruits bilaterally.  Neurological examination:  Orientation: The patient is alert and oriented x3. Cranial nerves: There is good facial symmetry with facial hypomimia. The speech is fluent and clear. Soft palate rises symmetrically and there is no tongue deviation. Hearing is intact to conversational tone. Sensation: Sensation is intact to light touch throughout Motor: Strength is at least antigravity x4.  Movement examination: Tone: There is nl tone in the ue/le Abnormal movements: there is some dyskinesia of the L leg Coordination:  There is no decremation with RAM's, with any form of RAMS, including alternating supination and pronation of the forearm, hand opening and closing, finger taps, heel taps and toe taps. Gait and Station: The patient has no difficulty arising out of a deep-seated chair without the use of the hands. The patient's stride length is good.  The patient has a neg pull test.     I have reviewed and interpreted the following labs independently    Chemistry      Component  Value Date/Time   NA 139 11/10/2016 1023   K 3.8 11/10/2016 1023   CL 99 (L) 11/10/2016 1023   CO2 32 11/10/2016 1023   BUN 19 11/10/2016 1023   CREATININE 0.79 11/10/2016 1023      Component Value Date/Time   CALCIUM 9.5 11/10/2016 1023   ALKPHOS 73 11/24/2012 0055   AST 32 11/24/2012 0055   ALT 21 11/24/2012 0055   BILITOT 1.4 (H) 11/24/2012 0055       Lab Results  Component Value Date   WBC 6.7 11/18/2018   HGB 9.7 (L) 11/18/2018   HCT 30.0 (L) 11/18/2018   MCV 90.1 11/18/2018   PLT 201 11/18/2018    Lab Results  Component Value Date   TSH 7.244 (H) 11/24/2012   Received lab work from primary care dated May 27, 2019.  Sodium was 142, potassium 4.0, chloride 102, CO2 27, BUN 13, glucose 93, AST 16, ALT 14,  white blood cells 5.8, hemoglobin was low at 9.6, hematocrit 32.5, platelets 205.  TSH was 1.75.  Total time spent on today's visit was 30 minutes, including both face-to-face time and nonface-to-face time.  Time included that spent on review of records (prior notes available to me/labs/imaging if pertinent), discussing treatment and goals, answering patient's questions and coordinating care.  Cc:  Crist Infante, MD

## 2020-03-14 ENCOUNTER — Other Ambulatory Visit: Payer: Self-pay | Admitting: Neurology

## 2020-03-14 DIAGNOSIS — L570 Actinic keratosis: Secondary | ICD-10-CM | POA: Diagnosis not present

## 2020-03-14 DIAGNOSIS — D225 Melanocytic nevi of trunk: Secondary | ICD-10-CM | POA: Diagnosis not present

## 2020-03-14 DIAGNOSIS — L820 Inflamed seborrheic keratosis: Secondary | ICD-10-CM | POA: Diagnosis not present

## 2020-03-14 DIAGNOSIS — L821 Other seborrheic keratosis: Secondary | ICD-10-CM | POA: Diagnosis not present

## 2020-03-14 DIAGNOSIS — Z85828 Personal history of other malignant neoplasm of skin: Secondary | ICD-10-CM | POA: Diagnosis not present

## 2020-03-14 DIAGNOSIS — D2262 Melanocytic nevi of left upper limb, including shoulder: Secondary | ICD-10-CM | POA: Diagnosis not present

## 2020-03-14 DIAGNOSIS — L218 Other seborrheic dermatitis: Secondary | ICD-10-CM | POA: Diagnosis not present

## 2020-03-14 NOTE — Telephone Encounter (Signed)
Rx(s) sent to pharmacy electronically.  

## 2020-03-19 ENCOUNTER — Encounter: Payer: Self-pay | Admitting: Neurology

## 2020-03-19 ENCOUNTER — Ambulatory Visit (INDEPENDENT_AMBULATORY_CARE_PROVIDER_SITE_OTHER): Payer: Medicare Other | Admitting: Neurology

## 2020-03-19 ENCOUNTER — Other Ambulatory Visit: Payer: Self-pay

## 2020-03-19 VITALS — BP 104/70 | HR 80 | Ht 61.5 in | Wt 135.0 lb

## 2020-03-19 DIAGNOSIS — G20 Parkinson's disease: Secondary | ICD-10-CM

## 2020-03-19 DIAGNOSIS — G249 Dystonia, unspecified: Secondary | ICD-10-CM | POA: Diagnosis not present

## 2020-03-19 MED ORDER — CLONAZEPAM 0.5 MG PO TABS
0.2500 mg | ORAL_TABLET | Freq: Every day | ORAL | 1 refills | Status: DC
Start: 2020-03-19 — End: 2020-10-10

## 2020-03-19 MED ORDER — RASAGILINE MESYLATE 1 MG PO TABS
1.0000 mg | ORAL_TABLET | Freq: Every day | ORAL | 1 refills | Status: DC
Start: 2020-03-19 — End: 2020-10-10

## 2020-03-19 NOTE — Patient Instructions (Signed)
The physicians and staff at [facility] are committed to providing excellent care. You may receive a survey requesting feedback about your experience at our office. We strive to receive "very good" responses to the survey questions. If you feel that your experience would prevent you from giving the office a "very good " response, please contact our office to try to remedy the situation. We may be reached at [phone]. Thank you for taking the time out of your busy day to complete the survey.  Online Resources for Power over Parkinson's Group February 2022  . Local [place] Online Groups  o Power over [employer] Group :   - Power Over Parkinson's Patient Education Group will be Wednesday, February 9th at 2pm via Zoom.   - Upcoming Power over Parkinson's Meetings:  2nd Wednesdays of the month at 2 pm:       March 9th, April 13th - Contact Amy Marriott at [email or web address] if interested in participating in this online group o Parkinson's Care Partners Group:    3rd Mondays, Contact Corwin Levins o Atypical Parkinsonian Patient Group:   4th Wednesdays, Contact Corwin Levins o If you are interested in participating in these online groups with Judson Roch, please contact her directly for how to join those meetings.  Her contact information is [email or web address].  She will send you a link to join the [employer].  (Please note that Corwin Levins , MSW, LCSW, has resigned her position at [facility], but will continue to lead the online groups temporarily)  . [place]:  [email or web address] o PD Health at Home continues:  Mindfulness Mondays, Expert Briefing Tuesdays, Wellness Wednesdays, Take Time Thursdays, Fitness Fridays -Listings for February 2022 are on the website o Upcoming Webinar:  Sights, Sounds, and Parkinson's.  Wednesday, February 2 @ 1 pm o Upcoming Webinar:  Conversations about Complementary Therapies and PD.  Wednesday, March 2 @ 1  pm o Geneticist, molecular) at [email or web address] o  Please check out their website to sign up for emails and see their full online offerings  . [place]:  [email or web address]  o Upcoming Webinar:   Moving with Mood Changes in Aging and Parkinson's:  A Look at Depression and Anxiety.  (This is a replay of a webinar originally aired June 2020)  Thursday, February 17 @ 12 noon o Check out additional information on their website to see their full online offerings  . [place]:  [email or web address] o Upcoming Webinar:  The [place].  The Parkinson's You Don't See:  When your Autonomic System goes Off-Track.   Friday, February 18th.  Go to [email or web address], then click on Events, 01-07-1972 tab to register. o Care Partner Monthly Meetup.  With [employer] [place].  First Tuesday of each month, 2 pm o Check out additional information to Live Well Today on their website  . Parkinson and Movement Disorders (PMD) Alliance:  www.pmdalliance.org o [employer] Online:  Online Education Events o Sign up for emails, which are sent weekly to give you updates on programming and online offerings . Parkinson's Association of the [place]:  www.[email or web address] o Information on online support groups, education events, and online exercises including Yoga, Parkinson's exercises and more-LOTS of information on links to PD resources and online events o Virtual Support Group through Parkinson's Association of the [place]; next one is scheduled for Wednesday, April 04, 2020 at 2 pm. (These are typically scheduled for the 1st Wednesday of the month at 2 pm).  Visit website for details.  . Additional links for movement activities: o PWR! Moves Classes at [place] RESUMED (but are ON HOLD DUE TO COVID in February)  Contact Amy [place], PT  [email or web address] or [phone] if interested o Here is a link to the PWR!Moves classes on Zoom from [place] - Daily Mon-Sat at 10:00. Via Zoom, FREE and open to all.  There is also a link below via Facebook if you use that platform. - [email or web address].si - [email or web address].no o Parkinson's Wellness Recovery (PWR! Moves)  www.[email or web address] - Info on the PWR! Virtual Experience:  You will have access to our expertise through self-assessment, guided plans that start with the PD-specific fundamentals, educational content, tips, Q&A with an expert, and a growing Art therapist of PD-specific pre-recorded and live exercise classes of varying types and intensity - both physical and cognitive! If that is not enough, we offer 1:1 wellness consultations (in-person or virtual) to personalize your PWR! Research scientist (medical).  - Check out the PWR! Move of the month on the [place] Recovery website:  [email or web address] o [employer] Fridays:  - As part of the PD Health @ Home program, this free video series focuses each week on one aspect of fitness designed to support people living with Parkinson's.  These weekly videos highlight the [place] recent fitness guidelines for people with Parkinson's disease. -  [email or web address].dk o Dance for PD website is offering free, live-stream classes throughout the week, as well as links to [employer] of classes:  [email or web address] o Dance for Parkinson's Class:  [place].  Free offering for people with Parkinson's and care partners; virtual class.  o For more information, contact [phone] or email  Ruffin Frederick at [email or web address] o Virtual dance and Pilates for Parkinson's classes: Click on the Community Tab> Parkinson's Movement Initiative Tab.  To register for classes and for more information, visit [email or web address] and click the "community" tab.    o [employer] Parkinson's Cycling Classes  - [facility]: 1pm on Fridays-Live classes at [facility] [employer] at [email or web address] or [phone]) [facility] [employer]: Virtual Classes Mondays and Thursdays (contact [place] at [email or web address] or [phone])   o [employer] - Three levels of classes are offered Tuesdays and Thursdays:  10:30 am,  12 noon & 1:45 pm at [facility]. To observe a class or for  more information, call [phone] or email [email or web address] - PD Flow Yoga for Parkinson's:  Fridays 1-2 pm, January 7-March 30, 2020 . [employer]: o Chief Technology Officer Opportunities:  www.well-springsolutions.org/caregiver-education/caregiver-support-group.  You may also contact Vickki Muff at [email or web address] or [phone].   o Virtual Caregiver Retreat, Monday, February 21st, 3-5 pm o Powerful Tools for Caregivers, 6-wk series for caregivers, planning to be in person, starting March 10th o Well-Spring Navigator:  March 23 program, a free service to help individuals and families through the journey of determining care for older adults.  The "Navigator" is a [employer], Education officer, museum, who will speak with a prospective client and/or loved ones to provide an assessment of the situation and a set of recommendations for a personalized care plan -- all free of charge, and whether [employer] offers the needed service or not. If the need is not a service we provide, we are well-connected with reputable programs in town that we can refer you to.  www.well-[email or web address] or to speak with  the Navigator, call [phone].

## 2020-04-03 ENCOUNTER — Ambulatory Visit (INDEPENDENT_AMBULATORY_CARE_PROVIDER_SITE_OTHER): Payer: Medicare Other | Admitting: Obstetrics & Gynecology

## 2020-04-03 ENCOUNTER — Other Ambulatory Visit: Payer: Self-pay

## 2020-04-03 ENCOUNTER — Encounter (HOSPITAL_BASED_OUTPATIENT_CLINIC_OR_DEPARTMENT_OTHER): Payer: Self-pay | Admitting: Obstetrics & Gynecology

## 2020-04-03 VITALS — BP 106/71 | HR 81 | Ht 61.0 in | Wt 136.4 lb

## 2020-04-03 DIAGNOSIS — I495 Sick sinus syndrome: Secondary | ICD-10-CM

## 2020-04-03 DIAGNOSIS — G20A1 Parkinson's disease without dyskinesia, without mention of fluctuations: Secondary | ICD-10-CM

## 2020-04-03 DIAGNOSIS — Z9229 Personal history of other drug therapy: Secondary | ICD-10-CM | POA: Diagnosis not present

## 2020-04-03 DIAGNOSIS — M810 Age-related osteoporosis without current pathological fracture: Secondary | ICD-10-CM | POA: Diagnosis not present

## 2020-04-03 DIAGNOSIS — Z780 Asymptomatic menopausal state: Secondary | ICD-10-CM | POA: Diagnosis not present

## 2020-04-03 DIAGNOSIS — Z01419 Encounter for gynecological examination (general) (routine) without abnormal findings: Secondary | ICD-10-CM

## 2020-04-03 DIAGNOSIS — G20 Parkinson's disease: Secondary | ICD-10-CM

## 2020-04-03 DIAGNOSIS — I10 Essential (primary) hypertension: Secondary | ICD-10-CM

## 2020-04-03 NOTE — Progress Notes (Signed)
80 y.o. G2P2 Married White or Caucasian female here for breast and pelvic exam.  She saw an integrative provider who stared her on HRT.  She wants my opinion about this.  She's been on this about 2-3 months.  I really think she needs to stop this.  Risks for stroke, MI, and breast cancer are really present.  She is comfortable just stopping them and letting provider know.  Dr. Carles Collet is following her for Parkinson's.    Denies vaginal bleeding.  Patient's last menstrual period was 02/04/1996.          Sexually active: Yes.    H/O STD:  no  Health Maintenance: PCP:  Dr. Joylene Draft.  Last wellness appt was 01/2020.  Did blood work at that appt: yes Vaccines are up to date:  yes Colonoscopy:  2018 MMG:  06/2019 BMD:  Done with Dr. Joylene Draft Last pap smear:  2018.   H/o abnormal pap smear:  Remote hx   reports that she has never smoked. She has never used smokeless tobacco. She reports that she does not drink alcohol and does not use drugs.  Past Medical History:  Diagnosis Date  . Anemia   . Arthritis    thumbs  . Atrial tachycardia ([place])   . 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 Cataract    bilateral  . Chronic anticoagulation    on Xarelto  . Chronic diastolic CHF (congestive heart failure) (HCC)    Echocardiogram (11/24/12): Mild LVH, EF 60%.  . Dysrhythmia   . GERD (gastroesophageal reflux disease)    hx of no meds  . Heart murmur    slight  . Hyperlipidemia    a. Hx of leg weakness while on statin. under control  . Hypertension   . Hypothyroidism   . Leg fracture Dec 21,2011  . Osteoporosis 07/2007  . Parkinson's disease ([place]) 2011  . Persistent atrial fibrillation (HCC)    s/p atrial fib ablation 11-11-11.   [name] PONV (postoperative nausea and vomiting)   . Presence of permanent cardiac pacemaker    permanent Medtronic  . Shortness of breath    "when walking at incline or stairs"  . 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 Squamous carcinoma summer 2015   back of left thigh  .  Wrist fracture    right    Past Surgical History:  Procedure Laterality Date  . ATRIAL FIBRILLATION ABLATION N/A 11/11/2011   Procedure: ATRIAL FIBRILLATION ABLATION;  Surgeon: Thompson Grayer, MD;  Location: [facility] CATH LAB;  Service: Cardiovascular;  Laterality: N/A;  . BREAST BIOPSY  1998  . BREAST SURGERY     benign cyst removed  . CARDIAC ELECTROPHYSIOLOGY STUDY AND ABLATION  5/14, 7/14   convergent AF ablation at [facility] and subsequent atypical atrial flutter ablation by Dr Lehman Prom  . CARDIOVASCULAR STRESS TEST  06-06-2008   EF 73%  . CARDIOVERSION  09/09/2011   Procedure: CARDIOVERSION;  Surgeon: Darlin Coco, MD;  Location: [facility] ENDOSCOPY;  Service: Cardiovascular;  Laterality: N/A;  to be done by dr. Mare Ferrari  . CARDIOVERSION  02/18/2012   Procedure: CARDIOVERSION;  Surgeon: Thayer Headings, MD;  Location: [facility];  Service: Cardiovascular;  Laterality: N/A;  . CHOLECYSTECTOMY  1995  . FEMUR FRACTURE SURGERY  2011   metal pin in place  . FOREARM SURGERY Left 2010   "opened arm to drain it"  . INSERTION OF MESH N/A 11/24/2013   Procedure: INSERTION OF MESH;  Surgeon: Michael Boston, MD;  Location: [facility];  Service: General;  Laterality: N/A;  . KNEE SURGERY Left 2001  . Left unicompartmental knee     Dr. Alvan Dame 11/18/16  . PACEMAKER PLACEMENT  10/20/12   MDT Advisa DR implanted by Dr Lehman Prom [facility]   . PARTIAL KNEE ARTHROPLASTY Left 11/18/2016   Procedure: LEFT UNICOMPARTMENTAL KNEE Medially;  Surgeon: Paralee Cancel, MD;  Location: [facility] ORS;  Service: Orthopedics;  Laterality: Left;  90 mins  . SKIN CANCER DESTRUCTION  summer 2015  . TEE WITHOUT CARDIOVERSION  11/10/2011   Procedure: TRANSESOPHAGEAL ECHOCARDIOGRAM (TEE);  Surgeon: Thayer Headings, MD;  Location: [facility];  Service: Cardiovascular;  Laterality: N/A;  . TONSILLECTOMY  as child  . US ECHOCARDIOGRAPHY  03-10-2008   Est EF 55-60%, Dr. Cathie Olden  . VENTRAL HERNIA REPAIR N/A 11/24/2013   Procedure: LAPAROSCOPIC VENTRAL HERNIA;   Surgeon: Michael Boston, MD;  Location: [facility] ORS;  Service: General;  Laterality: N/A;  . WRIST SURGERY Right ~2009   metal rod in place    Current Outpatient Medications  Medication Sig Dispense Refill  . apixaban (ELIQUIS) 5 MG TABS tablet Take 1 tablet (5 mg total) by mouth 2 (two) times daily. 60 tablet 5  . CALCIUM PO Take 1 tablet by mouth at bedtime.    . Calcium Polycarbophil (FIBERCON PO) Take 2 tablets by mouth every morning.    . carbidopa-levodopa (SINEMET CR) 50-200 MG tablet TAKE 1 TABLET BY MOUTH EVERYDAY AT BEDTIME 90 tablet 0  . carbidopa-levodopa (SINEMET IR) 25-100 MG tablet TAKE 1 & 1/2 TABLET 3 TIMES A DAY (Patient taking differently: Taking bid) 360 tablet 0  . clonazePAM (KLONOPIN) 0.5 MG tablet Take 0.5 tablets (0.25 mg total) by mouth at bedtime. 45 tablet 1  . Coenzyme Q10 (CO Q 10 PO) Take 1 tablet by mouth every morning.    . cyanocobalamin (,VITAMIN B-12,) 1000 MCG/ML injection INJECT 1ML INTRAMUSCULARLY IN THE DELTOID ONCE A MONTH (ALTERNATING DELT. EACH MONTH)    . cyanocobalamin 100 MCG tablet Take 100 mcg by mouth daily.    Mariane Baumgarten Sodium (COLACE PO) Take 1 capsule by mouth 2 (two) times daily as needed (constipation).    . ezetimibe (ZETIA) 10 MG tablet Take 10 mg by mouth every other day. Taking the same day as the crestor    . furosemide (LASIX) 40 MG tablet TAKE 1 TABLET BY MOUTH TWICE A DAY 180 tablet 1  . levothyroxine (SYNTHROID) 50 MCG tablet Take 50 mcg by mouth daily.    . Magnesium Oxide 500 MG TABS Take by mouth daily.    . metoprolol succinate (TOPROL-XL) 25 MG 24 hr tablet TAKE 1 TABLET BY MOUTH DAILY. PLEASE MAKE OVERDUE APPT WITH DR. ALLRED BEFORE ANYMORE REFILLS. 90 tablet 3  . multivitamin-lutein (OCUVITE-LUTEIN) CAPS capsule Take 1 capsule by mouth daily.    . polyethylene glycol (MIRALAX / GLYCOLAX) packet Take 17 g by mouth 2 (two) times daily. (Patient taking differently: Take 17 g by mouth daily as needed.) 14 each 0  . potassium  chloride (K-DUR,KLOR-CON) 10 MEQ tablet Take 10 mEq by mouth 2 (two) times daily.     [name] PROGESTERONE PO Take 25 mg by mouth at bedtime.    . raloxifene (EVISTA) 60 MG tablet Take 60 mg by mouth daily.  3  . rasagiline (AZILECT) 1 MG TABS tablet Take 1 tablet (1 mg total) by mouth daily. 90 tablet 1  . rosuvastatin (CRESTOR) 5 MG tablet Take 2.5 mg by mouth every other day.    [name]  valACYclovir (VALTREX) 1000 MG tablet Take 1,000 mg by mouth 2 (two) times daily as needed (fever blisters).    [name] VITAMIN D, ERGOCALCIFEROL, PO Take 1,000 Units by mouth daily.      No current facility-administered medications for this visit.    Family History  Problem Relation Age of Onset  . Alzheimer's disease Mother   . Heart failure Father   . Healthy Child   . Healthy Child   . Breast cancer Maternal Aunt     Review of Systems  Constitutional: Negative.   Gastrointestinal: Negative.   Genitourinary: Negative.     Exam:   BP 106/71   Pulse 81   Ht 5\' 1"  (1.549 m)   Wt 136 lb 6.4 oz (61.9 kg)   LMP 02/04/1996   SpO2 99%   BMI 25.77 kg/m   Height: 5\' 1"  (154.9 cm)  General appearance: alert, cooperative and appears stated age Breasts: normal appearance, no masses or tenderness Abdomen: soft, non-tender; bowel sounds normal; no masses,  no organomegaly Lymph nodes: Cervical, supraclavicular, and axillary nodes normal.  No abnormal inguinal nodes palpated Neurologic: Grossly normal  Pelvic: External genitalia:  no lesions              Urethra:  normal appearing urethra with no masses, tenderness or lesions              Bartholins and Skenes: normal                 Vagina: normal appearing vagina with normal color and discharge, no lesions              Cervix: no lesions              Pap taken: No. Bimanual Exam:  Uterus:  normal size, contour, position, consistency, mobility, non-tender              Adnexa: normal adnexa and no mass, fullness, tenderness               Rectovaginal: Confirms                Anus:  normal sphincter tone, no lesions  Chaperone, Britt Bottom, CMA, was present for exam.  Assessment/Plan: 1. Encntr for gyn exam (general) (routine) w/o abn findings - last pap smear 2018, normal.  No more recommended due to age and long hx of normal pap smears - MMG 06/2019 - colonoscopy 02/2017.  Recommended repeat 5 years if pt healthy at that time - BMD followed by Dr. Merryl Hacker - lab work done 01/2011 - vaccines UTD  2. Parkinson's disease ([place]) - followed by Dr. Carles Collet  3. Age-related osteoporosis without current pathological fracture -  BMD followed by Dr. Dollene Cleveland  4. Postmenopausal  5. Primary hypertension  6. History of postmenopausal HRT - have highly encouraged her to stop this now.  States she will and will let provider who prescribed know she has stopped  7. Sick sinus syndrome (HCC) - Followed by Dr. Rayann Heman   22 minutes of total time was spent for this patient encounter, including preparation, face-to-face counseling with the patient and coordination of care, and documentation of the encounter.

## 2020-05-10 ENCOUNTER — Ambulatory Visit (INDEPENDENT_AMBULATORY_CARE_PROVIDER_SITE_OTHER): Payer: Medicare Other

## 2020-05-10 DIAGNOSIS — I495 Sick sinus syndrome: Secondary | ICD-10-CM

## 2020-05-10 LAB — CUP PACEART REMOTE DEVICE CHECK
Battery Remaining Longevity: 13 mo
Battery Voltage: 2.89 V
Brady Statistic AP VP Percent: 9.66 %
Brady Statistic AP VS Percent: 55.4 %
Brady Statistic AS VP Percent: 1.84 %
Brady Statistic AS VS Percent: 33.1 %
Brady Statistic RA Percent Paced: 62.82 %
Brady Statistic RV Percent Paced: 11.17 %
Date Time Interrogation Session: 20220406170403
Implantable Lead Implant Date: 20140917
Implantable Lead Implant Date: 20140917
Implantable Lead Location: 753859
Implantable Lead Location: 753860
Implantable Pulse Generator Implant Date: 20140917
Lead Channel Impedance Value: 285 Ohm
Lead Channel Impedance Value: 323 Ohm
Lead Channel Impedance Value: 342 Ohm
Lead Channel Impedance Value: 437 Ohm
Lead Channel Pacing Threshold Amplitude: 0.625 V
Lead Channel Pacing Threshold Amplitude: 1 V
Lead Channel Pacing Threshold Pulse Width: 0.4 ms
Lead Channel Pacing Threshold Pulse Width: 0.4 ms
Lead Channel Sensing Intrinsic Amplitude: 0.5 mV
Lead Channel Sensing Intrinsic Amplitude: 0.5 mV
Lead Channel Sensing Intrinsic Amplitude: 13.375 mV
Lead Channel Sensing Intrinsic Amplitude: 13.375 mV
Lead Channel Setting Pacing Amplitude: 2 V
Lead Channel Setting Pacing Amplitude: 2.5 V
Lead Channel Setting Pacing Pulse Width: 0.4 ms
Lead Channel Setting Sensing Sensitivity: 0.9 mV

## 2020-05-17 ENCOUNTER — Other Ambulatory Visit: Payer: Self-pay | Admitting: Internal Medicine

## 2020-05-17 DIAGNOSIS — Z1231 Encounter for screening mammogram for malignant neoplasm of breast: Secondary | ICD-10-CM

## 2020-05-21 ENCOUNTER — Other Ambulatory Visit: Payer: Self-pay | Admitting: Neurology

## 2020-05-22 ENCOUNTER — Ambulatory Visit: Payer: Medicare Other | Admitting: Obstetrics & Gynecology

## 2020-05-23 NOTE — Progress Notes (Signed)
Remote pacemaker transmission.   

## 2020-05-29 DIAGNOSIS — E785 Hyperlipidemia, unspecified: Secondary | ICD-10-CM | POA: Diagnosis not present

## 2020-05-29 DIAGNOSIS — R7301 Impaired fasting glucose: Secondary | ICD-10-CM | POA: Diagnosis not present

## 2020-05-29 DIAGNOSIS — E039 Hypothyroidism, unspecified: Secondary | ICD-10-CM | POA: Diagnosis not present

## 2020-05-29 DIAGNOSIS — E538 Deficiency of other specified B group vitamins: Secondary | ICD-10-CM | POA: Diagnosis not present

## 2020-05-29 DIAGNOSIS — M810 Age-related osteoporosis without current pathological fracture: Secondary | ICD-10-CM | POA: Diagnosis not present

## 2020-05-31 ENCOUNTER — Other Ambulatory Visit: Payer: Self-pay

## 2020-05-31 ENCOUNTER — Other Ambulatory Visit (HOSPITAL_BASED_OUTPATIENT_CLINIC_OR_DEPARTMENT_OTHER): Payer: Self-pay

## 2020-05-31 ENCOUNTER — Ambulatory Visit: Payer: Medicare Other | Attending: Internal Medicine

## 2020-05-31 DIAGNOSIS — Z23 Encounter for immunization: Secondary | ICD-10-CM

## 2020-05-31 MED ORDER — PFIZER-BIONT COVID-19 VAC-TRIS 30 MCG/0.3ML IM SUSP
INTRAMUSCULAR | 0 refills | Status: DC
  Filled 2020-05-31: qty 0.3, 1d supply, fill #0

## 2020-05-31 NOTE — Progress Notes (Signed)
   Covid-19 Vaccination Clinic  Name:  Kaylyne Axton    MRN: [id number] DOB: Mar 26, 1940  05/31/2020  Ms. Shontz was observed post Covid-19 immunization for 15 minutes without incident. She was provided with Vaccine Information Sheet and instruction to access the V-Safe system.   Ms. Lusher was instructed to call 911 with any severe reactions post vaccine: [name] Difficulty breathing  . Swelling of face and throat  . A fast heartbeat  . A bad rash all over body  . Dizziness and weakness   Immunizations Administered    Name Date Dose VIS Date Route   PFIZER Comrnaty(Gray TOP) Covid-19 Vaccine 05/31/2020 11:22 AM 0.3 mL 01/12/2020 Intramuscular   Manufacturer: [place]   Lot: [id number]   [place]: [phone]

## 2020-06-05 DIAGNOSIS — Z0000 Encounter for general adult medical examination without abnormal findings: Secondary | ICD-10-CM | POA: Diagnosis not present

## 2020-06-05 DIAGNOSIS — I10 Essential (primary) hypertension: Secondary | ICD-10-CM | POA: Diagnosis not present

## 2020-06-05 DIAGNOSIS — R82998 Other abnormal findings in urine: Secondary | ICD-10-CM | POA: Diagnosis not present

## 2020-06-05 DIAGNOSIS — E538 Deficiency of other specified B group vitamins: Secondary | ICD-10-CM | POA: Diagnosis not present

## 2020-06-05 DIAGNOSIS — E785 Hyperlipidemia, unspecified: Secondary | ICD-10-CM | POA: Diagnosis not present

## 2020-06-05 DIAGNOSIS — G20 Parkinson's disease: Secondary | ICD-10-CM | POA: Diagnosis not present

## 2020-06-05 DIAGNOSIS — I509 Heart failure, unspecified: Secondary | ICD-10-CM | POA: Diagnosis not present

## 2020-06-05 DIAGNOSIS — E039 Hypothyroidism, unspecified: Secondary | ICD-10-CM | POA: Diagnosis not present

## 2020-06-05 DIAGNOSIS — R109 Unspecified abdominal pain: Secondary | ICD-10-CM | POA: Diagnosis not present

## 2020-06-05 DIAGNOSIS — K7460 Unspecified cirrhosis of liver: Secondary | ICD-10-CM | POA: Diagnosis not present

## 2020-06-05 DIAGNOSIS — D6869 Other thrombophilia: Secondary | ICD-10-CM | POA: Diagnosis not present

## 2020-06-05 DIAGNOSIS — R945 Abnormal results of liver function studies: Secondary | ICD-10-CM | POA: Diagnosis not present

## 2020-06-05 DIAGNOSIS — M810 Age-related osteoporosis without current pathological fracture: Secondary | ICD-10-CM | POA: Diagnosis not present

## 2020-06-12 DIAGNOSIS — K921 Melena: Secondary | ICD-10-CM | POA: Diagnosis not present

## 2020-06-18 DIAGNOSIS — Z961 Presence of intraocular lens: Secondary | ICD-10-CM | POA: Diagnosis not present

## 2020-06-18 DIAGNOSIS — H353132 Nonexudative age-related macular degeneration, bilateral, intermediate dry stage: Secondary | ICD-10-CM | POA: Diagnosis not present

## 2020-06-18 DIAGNOSIS — H26493 Other secondary cataract, bilateral: Secondary | ICD-10-CM | POA: Diagnosis not present

## 2020-06-18 DIAGNOSIS — H524 Presbyopia: Secondary | ICD-10-CM | POA: Diagnosis not present

## 2020-06-19 ENCOUNTER — Telehealth: Payer: Self-pay | Admitting: *Deleted

## 2020-06-19 DIAGNOSIS — D649 Anemia, unspecified: Secondary | ICD-10-CM | POA: Diagnosis not present

## 2020-06-19 DIAGNOSIS — K7460 Unspecified cirrhosis of liver: Secondary | ICD-10-CM | POA: Diagnosis not present

## 2020-06-19 DIAGNOSIS — R198 Other specified symptoms and signs involving the digestive system and abdomen: Secondary | ICD-10-CM | POA: Diagnosis not present

## 2020-06-19 DIAGNOSIS — E039 Hypothyroidism, unspecified: Secondary | ICD-10-CM | POA: Diagnosis not present

## 2020-06-19 DIAGNOSIS — R195 Other fecal abnormalities: Secondary | ICD-10-CM | POA: Diagnosis not present

## 2020-06-19 DIAGNOSIS — R932 Abnormal findings on diagnostic imaging of liver and biliary tract: Secondary | ICD-10-CM | POA: Diagnosis not present

## 2020-06-19 DIAGNOSIS — Z86010 Personal history of colonic polyps: Secondary | ICD-10-CM | POA: Diagnosis not present

## 2020-06-19 DIAGNOSIS — Z7901 Long term (current) use of anticoagulants: Secondary | ICD-10-CM | POA: Diagnosis not present

## 2020-06-19 NOTE — Telephone Encounter (Signed)
   [facility] Pre-operative Risk Assessment    Patient Name: Teeghan Hammer  DOB: [birth year]  MRN: [id number]   HEARTCARE STAFF: - Please ensure there is not already an duplicate clearance open for this procedure. - Under Visit Info/Reason for Call, type in Other and utilize the format Clearance MM/DD/YY or Clearance TBD. Do not use dashes or single digits. - If request is for dental extraction, please clarify the # of teeth to be extracted.  Request for surgical clearance:  1. What type of surgery is being performed? COLONOSCOPY/EGD; HEME + STOOL  2. When is this surgery scheduled? 10/16/20   3. What type of clearance is required (medical clearance vs. Pharmacy clearance to hold med vs. Both)? BOTH  4. Are there any medications that need to be held prior to surgery and how long? HOLD ELIQUIS THE DAY BEFORE AND THE MORNING OF PROCEDURE   5. Practice name and name of physician performing surgery? [facility]; DR. Cristina Gong   6. What is the office phone number? [phone]   7.   What is the office fax number? [phone]  8.   Anesthesia type (None, local, MAC, general) ? NOT LISTED (PROPOFOL?)   Julaine Hua 06/19/2020, 2:36 PM  _________________________________________________________________   (provider comments below)

## 2020-06-20 ENCOUNTER — Other Ambulatory Visit: Payer: Self-pay | Admitting: Internal Medicine

## 2020-06-20 ENCOUNTER — Other Ambulatory Visit: Payer: Self-pay | Admitting: Neurology

## 2020-06-20 MED ORDER — ELIQUIS 5 MG PO TABS: 5.0000 mg | ORAL_TABLET | Freq: Two times a day (BID) | ORAL | 5 refills | Status: DC

## 2020-06-20 NOTE — Telephone Encounter (Signed)
Will route to PharmD for rec's re: holding anticoagulation. Richardson Dopp, PA-C    06/20/2020 12:35 PM

## 2020-06-20 NOTE — Telephone Encounter (Signed)
Patient with diagnosis of afib on Eliquis for anticoagulation.    Procedure: COLONOSCOPY/EGD; HEME + STOOL  Date of procedure: 10/16/20  CHA2DS2-VASc Score = 5  This indicates a 7.2% annual risk of stroke. The patient's score is based upon: CHF History: Yes HTN History: Yes Diabetes History: No Stroke History: No Vascular Disease History: No Age Score: 2 Gender Score: 1   CrCl 61mL/min Platelet count 205K   Per office protocol, patient can hold Eliquis for 1 day prior to procedure as requested.

## 2020-06-21 DIAGNOSIS — H26492 Other secondary cataract, left eye: Secondary | ICD-10-CM | POA: Diagnosis not present

## 2020-06-21 NOTE — Telephone Encounter (Signed)
Leave message for pt to call back 

## 2020-06-21 NOTE — Telephone Encounter (Signed)
Appt schedule with Dr Rayann Heman for August 26th @ 9:45 am

## 2020-06-21 NOTE — Telephone Encounter (Signed)
Primary Cardiologist:[name] Nahser, MD  Chart reviewed as part of pre-operative protocol coverage. Because of [name] [name]'s past medical history and time since last visit, he/she will require a follow-up visit in order to better assess preoperative cardiovascular risk.  Pre-op covering staff: - Please schedule appointment and call patient to inform them. - Please contact requesting surgeon's office via preferred method (i.e, phone, fax) to inform them of need for appointment prior to surgery.  If applicable, this message will also be routed to pharmacy pool and/or primary cardiologist for input on holding anticoagulant/antiplatelet agent as requested below so that this information is available at time of patient's appointment.   [name] Pelton, [name] [name]  06/21/2020, 9:34 AM

## 2020-07-17 ENCOUNTER — Ambulatory Visit
Admission: RE | Admit: 2020-07-17 | Discharge: 2020-07-17 | Disposition: A | Discharge status: Home / Self Care | Payer: Medicare Other | Source: Ambulatory Visit | Attending: Internal Medicine | Admitting: Internal Medicine

## 2020-07-17 ENCOUNTER — Other Ambulatory Visit: Payer: Self-pay

## 2020-07-17 DIAGNOSIS — Z1231 Encounter for screening mammogram for malignant neoplasm of breast: Secondary | ICD-10-CM | POA: Diagnosis not present

## 2020-08-09 ENCOUNTER — Ambulatory Visit (INDEPENDENT_AMBULATORY_CARE_PROVIDER_SITE_OTHER): Payer: Medicare Other

## 2020-08-09 DIAGNOSIS — I495 Sick sinus syndrome: Secondary | ICD-10-CM | POA: Diagnosis not present

## 2020-08-12 LAB — CUP PACEART REMOTE DEVICE CHECK
Battery Remaining Longevity: 8 mo
Battery Voltage: 2.88 V
Brady Statistic AP VP Percent: 9.47 %
Brady Statistic AP VS Percent: 42.11 %
Brady Statistic AS VP Percent: 19.44 %
Brady Statistic AS VS Percent: 28.98 %
Brady Statistic RA Percent Paced: 48.43 %
Brady Statistic RV Percent Paced: 27.75 %
Date Time Interrogation Session: 20220708233431
Implantable Lead Implant Date: 20140917
Implantable Lead Implant Date: 20140917
Implantable Lead Location: 753859
Implantable Lead Location: 753860
Implantable Pulse Generator Implant Date: 20140917
Lead Channel Impedance Value: 304 Ohm
Lead Channel Impedance Value: 342 Ohm
Lead Channel Impedance Value: 342 Ohm
Lead Channel Impedance Value: 418 Ohm
Lead Channel Pacing Threshold Amplitude: 0.625 V
Lead Channel Pacing Threshold Amplitude: 0.875 V
Lead Channel Pacing Threshold Pulse Width: 0.4 ms
Lead Channel Pacing Threshold Pulse Width: 0.4 ms
Lead Channel Sensing Intrinsic Amplitude: 0.625 mV
Lead Channel Sensing Intrinsic Amplitude: 0.625 mV
Lead Channel Sensing Intrinsic Amplitude: 13.375 mV
Lead Channel Sensing Intrinsic Amplitude: 13.375 mV
Lead Channel Setting Pacing Amplitude: 2 V
Lead Channel Setting Pacing Amplitude: 2.5 V
Lead Channel Setting Pacing Pulse Width: 0.4 ms
Lead Channel Setting Sensing Sensitivity: 0.9 mV

## 2020-08-31 NOTE — Progress Notes (Signed)
Remote pacemaker transmission.   

## 2020-09-05 ENCOUNTER — Other Ambulatory Visit: Payer: Self-pay | Admitting: Neurology

## 2020-09-27 ENCOUNTER — Other Ambulatory Visit: Payer: Self-pay | Admitting: Neurology

## 2020-09-28 ENCOUNTER — Ambulatory Visit (INDEPENDENT_AMBULATORY_CARE_PROVIDER_SITE_OTHER): Payer: Medicare Other | Admitting: Internal Medicine

## 2020-09-28 ENCOUNTER — Other Ambulatory Visit: Payer: Self-pay

## 2020-09-28 ENCOUNTER — Encounter: Payer: Self-pay | Admitting: Internal Medicine

## 2020-09-28 VITALS — BP 144/82 | HR 76 | Ht 61.5 in | Wt 153.8 lb

## 2020-09-28 DIAGNOSIS — I5032 Chronic diastolic (congestive) heart failure: Secondary | ICD-10-CM | POA: Diagnosis not present

## 2020-09-28 DIAGNOSIS — R001 Bradycardia, unspecified: Secondary | ICD-10-CM | POA: Diagnosis not present

## 2020-09-28 DIAGNOSIS — I5033 Acute on chronic diastolic (congestive) heart failure: Secondary | ICD-10-CM | POA: Diagnosis not present

## 2020-09-28 DIAGNOSIS — I10 Essential (primary) hypertension: Secondary | ICD-10-CM

## 2020-09-28 DIAGNOSIS — I4819 Other persistent atrial fibrillation: Secondary | ICD-10-CM | POA: Diagnosis not present

## 2020-09-28 DIAGNOSIS — J90 Pleural effusion, not elsewhere classified: Secondary | ICD-10-CM | POA: Diagnosis not present

## 2020-09-28 NOTE — Patient Instructions (Addendum)
Medication Instructions:  Your physician recommends that you continue on your current medications as directed. Please refer to the Current Medication list given to you today.  Labwork: Pro BNP, BMET, CBC  Testing/Procedures: None ordered.  Follow-Up: Your physician wants you to follow-up in: 12/31/20 at 10:15 am one of the following Advanced Practice Providers on your designated Care Team:    Tommye Standard, PA-C   Remote monitoring is used to monitor your Pacemaker from home. This monitoring reduces the number of office visits required to check your device to one time per year. It allows [place] to keep an eye on the functioning of your device to ensure it is working properly. You are scheduled for a device check from home on 11/08/20. You may send your transmission at any time that day. If you have a wireless device, the transmission will be sent automatically. After your physician reviews your transmission, you will receive a postcard with your next transmission date.  Any Other Special Instructions Will Be Listed Below (If Applicable).  If you need a refill on your cardiac medications before your next appointment, please call your pharmacy.   Low-Sodium Eating Plan (2 grams daily) Sodium, which is an element that makes up salt, helps you maintain a healthy balance of fluids in your body. Too much sodium can increase your bloodpressure and cause fluid and waste to be held in your body. Your health care provider or dietitian may recommend following this plan if you have high blood pressure (hypertension), kidney disease, liver disease, or heart failure. Eating less sodium can help lower your blood pressure, reduce swelling, and protect your heart, liver, andkidneys. What are tips for following this plan? Reading food labels The Nutrition Facts label lists the amount of sodium in one serving of the food. If you eat more than one serving, you must multiply the listed amount of sodium by the number  of servings. Choose foods with less than 140 mg of sodium per serving. Avoid foods with 300 mg of sodium or more per serving. Shopping  Look for lower-sodium products, often labeled as "low-sodium" or "no salt added." Always check the sodium content, even if foods are labeled as "unsalted" or "no salt added." Buy fresh foods. Avoid canned foods and pre-made or frozen meals. Avoid canned, cured, or processed meats. Buy breads that have less than 80 mg of sodium per slice.  Cooking  Eat more home-cooked food and less restaurant, buffet, and fast food. Avoid adding salt when cooking. Use salt-free seasonings or herbs instead of table salt or sea salt. Check with your health care provider or pharmacist before using salt substitutes. Cook with plant-based oils, such as canola, sunflower, or olive oil.  Meal planning When eating at a restaurant, ask that your food be prepared with less salt or no salt, if possible. Avoid dishes labeled as brined, pickled, cured, smoked, or made with soy sauce, miso, or teriyaki sauce. Avoid foods that contain MSG (monosodium glutamate). MSG is sometimes added to [place] food, bouillon, and some canned foods. Make meals that can be grilled, baked, poached, roasted, or steamed. These are generally made with less sodium. General information Most people on this plan should limit their sodium intake to 1,500-2,000 mg (milligrams) of sodium each day. What foods should I eat? Fruits Fresh, frozen, or canned fruit. Fruit juice. Vegetables Fresh or frozen vegetables. "No salt added" canned vegetables. "No salt added"tomato sauce and paste. Low-sodium or reduced-sodium tomato and vegetable juice. Grains Low-sodium cereals, including oats,  puffed wheat and rice, and shredded wheat. Low-sodium crackers. Unsalted rice. Unsalted pasta. Low-sodium bread.Whole-grain breads and whole-grain pasta. Meats and other proteins Fresh or frozen (no salt added) meat, poultry,  seafood, and fish. Low-sodium canned tuna and salmon. Unsalted nuts. Dried peas, beans, and lentils withoutadded salt. Unsalted canned beans. Eggs. Unsalted nut butters. Dairy Milk. Soy milk. Cheese that is naturally low in sodium, such as ricotta cheese, fresh mozzarella, or Swiss cheese. Low-sodium or reduced-sodium cheese. Creamcheese. Yogurt. Seasonings and condiments Fresh and dried herbs and spices. Salt-free seasonings. Low-sodium mustard and ketchup. Sodium-free salad dressing. Sodium-free light mayonnaise. Fresh orrefrigerated horseradish. Lemon juice. Vinegar. Other foods Homemade, reduced-sodium, or low-sodium soups. Unsalted popcorn and pretzels.Low-salt or salt-free chips. The items listed above may not be a complete list of foods and beverages you can eat. Contact a dietitian for more information. What foods should I avoid? Vegetables Sauerkraut, pickled vegetables, and relishes. Olives. [place] fries. Onion rings. Regular canned vegetables (not low-sodium or reduced-sodium). Regular canned tomato sauce and paste (not low-sodium or reduced-sodium). Regular tomato and vegetable juice (not low-sodium or reduced-sodium). Frozenvegetables in sauces. Grains Instant hot cereals. Bread stuffing, pancake, and biscuit mixes. Croutons. Seasoned rice or pasta mixes. Noodle soup cups. Boxed or frozen macaroni andcheese. Regular salted crackers. Self-rising flour. Meats and other proteins Meat or fish that is salted, canned, smoked, spiced, or pickled. Precooked or cured meat, such as sausages or meat loaves. Berniece Salines. Ham. Pepperoni. Hot dogs. Corned beef. Chipped beef. Salt pork. Jerky. Pickled herring. Anchovies andsardines. Regular canned tuna. Salted nuts. Dairy Processed cheese and cheese spreads. Hard cheeses. Cheese curds. Blue cheese.Feta cheese. String cheese. Regular cottage cheese. Buttermilk. Canned milk. Fats and oils Salted butter. Regular margarine. Ghee. Bacon fat. Seasonings and  condiments Onion salt, garlic salt, seasoned salt, table salt, and sea salt. Canned and packaged gravies. Worcestershire sauce. Tartar sauce. Barbecue sauce. Teriyaki sauce. Soy sauce, including reduced-sodium. Steak sauce. Fish sauce. Oyster sauce. Cocktail sauce. Horseradish that you find on the shelf. Regular ketchup and mustard. Meat flavorings and tenderizers. Bouillon cubes. Hot sauce. Pre-made or packaged marinades. Pre-made or packaged taco seasonings. Relishes.Regular salad dressings. Salsa. Other foods Salted popcorn and pretzels. Corn chips and puffs. Potato and tortilla chips.Canned or dried soups. Pizza. Frozen entrees and pot pies. The items listed above may not be a complete list of foods and beverages you should avoid. Contact a dietitian for more information. Summary Eating less sodium can help lower your blood pressure, reduce swelling, and protect your heart, liver, and kidneys. Most people on this plan should limit their sodium intake to 1,500-2,000 mg (milligrams) of sodium each day. Canned, boxed, and frozen foods are high in sodium. Restaurant foods, fast foods, and pizza are also very high in sodium. You also get sodium by adding salt to food. Try to cook at home, eat more fresh fruits and vegetables, and eat less fast food and canned, processed, or prepared foods. This information is not intended to replace advice given to you by your health care provider. Make sure you discuss any questions you have with your healthcare provider. Document Revised: 02/25/2019 Document Reviewed: 12/22/2018 Elsevier Patient Education  2022 [employer].

## 2020-09-28 NOTE — Progress Notes (Signed)
PCP: Crist Infante, MD   Primary EP:  Dr Rayann Heman  [name] [name] is a 80 y.o. female who presents today for routine electrophysiology followup.  Since last being seen in our clinic, the patient reports doing reasonably well.   She has found over the past 3 weeks that her SOB has reduced.  She has been taking her lasix less than prescribed and has had a high sodium diet recently.  + fatigue.  She has also noticed increased weight gain and edema.  Today, she denies symptoms of palpitations, chest pain,dizziness, presyncope, or syncope.  The patient is otherwise without complaint today.   Past Medical History:  Diagnosis Date   Anemia    Arthritis    thumbs   Atrial tachycardia (HCC)    Atypical atrial flutter (HCC)    Cataract    bilateral   Chronic anticoagulation    on Xarelto   Chronic diastolic CHF (congestive heart failure) (HCC)    Echocardiogram (11/24/12): Mild LVH, EF 60%.   Dysrhythmia    GERD (gastroesophageal reflux disease)    hx of no meds   Heart murmur    slight   Hyperlipidemia    a. Hx of leg weakness while on statin. under control   Hypertension    Hypothyroidism    Leg fracture Dec 21,2011   Osteoporosis 07/2007   Parkinson's disease ([place]) 2011   Persistent atrial fibrillation ([place])    s/p atrial fib ablation 11-11-11.    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[place])    Squamous carcinoma summer 2015   back of left thigh   Wrist fracture    right   Past Surgical History:  Procedure Laterality Date   ATRIAL FIBRILLATION ABLATION N/A 11/11/2011   Procedure: ATRIAL FIBRILLATION ABLATION;  Surgeon: Thompson Grayer, MD;  Location: [facility] CATH LAB;  Service: Cardiovascular;  Laterality: N/A;   BREAST BIOPSY  1998   BREAST SURGERY     benign cyst removed   CARDIAC ELECTROPHYSIOLOGY STUDY AND  ABLATION  5/14, 7/14   convergent AF ablation at [facility] and subsequent atypical atrial flutter ablation by Dr Lehman Prom   CARDIOVASCULAR STRESS TEST  06-06-2008   EF 73%   CARDIOVERSION  09/09/2011   Procedure: CARDIOVERSION;  Surgeon: Darlin Coco, MD;  Location: [place];  Service: Cardiovascular;  Laterality: N/A;  to be done by dr. Mare Ferrari   CARDIOVERSION  02/18/2012   Procedure: CARDIOVERSION;  Surgeon: Thayer Headings, MD;  Location: [facility] ENDOSCOPY;  Service: Cardiovascular;  Laterality: N/A;   [facility]  2011   metal pin in place   FOREARM SURGERY Left 2010   "opened arm to drain it"   INSERTION OF MESH N/A 11/24/2013   Procedure: INSERTION OF MESH;  Surgeon: Michael Boston, MD;  Location: [facility] ORS;  Service: General;  Laterality: N/A;   KNEE SURGERY Left 2001   Left unicompartmental knee     Dr. Alvan Dame 11/18/16   PACEMAKER PLACEMENT  10/20/12   MDT Advisa DR implanted by Dr Lehman Prom at [place] Left 11/18/2016   Procedure: LEFT UNICOMPARTMENTAL KNEE Medially;  Surgeon: Paralee Cancel, MD;  Location: [facility] ORS;  Service: Orthopedics;  Laterality: Left;  90 mins   SKIN CANCER DESTRUCTION  summer 2015  TEE WITHOUT CARDIOVERSION  11/10/2011   Procedure: TRANSESOPHAGEAL ECHOCARDIOGRAM (TEE);  Surgeon: Thayer Headings, MD;  Location: [place];  Service: Cardiovascular;  Laterality: N/A;   TONSILLECTOMY  as child   US ECHOCARDIOGRAPHY  03-10-2008   Est EF 55-60%, Dr. Cathie Olden   VENTRAL HERNIA REPAIR N/A 11/24/2013   Procedure: LAPAROSCOPIC VENTRAL HERNIA;  Surgeon: Michael Boston, MD;  Location: [facility] ORS;  Service: General;  Laterality: N/A;   WRIST SURGERY Right ~2009   metal rod in place    ROS- all systems are reviewed and negative except as per HPI above  Current Outpatient Medications  Medication Sig Dispense Refill   apixaban (ELIQUIS) 5 MG TABS tablet Take 1 tablet (5 mg total) by mouth 2 (two) times daily. 60 tablet 5    CALCIUM PO Take 1 tablet by mouth at bedtime.     Calcium Polycarbophil (FIBERCON PO) Take 2 tablets by mouth every morning.     carbidopa-levodopa (SINEMET CR) 50-200 MG tablet TAKE 1 TABLET BY MOUTH EVERYDAY AT BEDTIME 90 tablet 0   carbidopa-levodopa (SINEMET IR) 25-100 MG tablet TAKE 1 AND 1/2 TABLETS BY MOUTH THREE TIMES A DAY 135 tablet 0   clonazePAM (KLONOPIN) 0.5 MG tablet Take 0.5 tablets (0.25 mg total) by mouth at bedtime. 45 tablet 1   Coenzyme Q10 (CO Q 10 PO) Take 1 tablet by mouth every morning.     COVID-19 mRNA Vac-TriS, Pfizer, (PFIZER-BIONT COVID-19 VAC-TRIS) SUSP injection Inject into the muscle. 0.3 mL 0   cyanocobalamin (,VITAMIN B-12,) 1000 MCG/ML injection INJECT 1ML INTRAMUSCULARLY IN THE DELTOID ONCE A MONTH (ALTERNATING DELT. EACH MONTH)     cyanocobalamin 100 MCG tablet Take 100 mcg by mouth daily.     Docusate Sodium (COLACE PO) Take 1 capsule by mouth 2 (two) times daily as needed (constipation).     ezetimibe (ZETIA) 10 MG tablet Take 10 mg by mouth every other day. Taking the same day as the crestor     furosemide (LASIX) 40 MG tablet TAKE 1 TABLET BY MOUTH TWICE A DAY 180 tablet 1   levothyroxine (SYNTHROID) 50 MCG tablet Take 50 mcg by mouth daily.     Magnesium Oxide 500 MG TABS Take by mouth daily.     metoprolol succinate (TOPROL-XL) 25 MG 24 hr tablet TAKE 1 TABLET BY MOUTH DAILY. PLEASE MAKE OVERDUE APPT WITH DR. Adrianne Shackleton BEFORE ANYMORE REFILLS. 90 tablet 3   multivitamin-lutein (OCUVITE-LUTEIN) CAPS capsule Take 1 capsule by mouth daily.     polyethylene glycol (MIRALAX / GLYCOLAX) packet Take 17 g by mouth 2 (two) times daily. (Patient taking differently: Take 17 g by mouth daily as needed.) 14 each 0   potassium chloride (K-DUR,KLOR-CON) 10 MEQ tablet Take 10 mEq by mouth 2 (two) times daily.      PROGESTERONE PO Take 25 mg by mouth at bedtime.     raloxifene (EVISTA) 60 MG tablet Take 60 mg by mouth daily.  3   rasagiline (AZILECT) 1 MG TABS tablet Take 1  tablet (1 mg total) by mouth daily. 90 tablet 1   rosuvastatin (CRESTOR) 5 MG tablet Take 2.5 mg by mouth every other day.     TWINRIX 720-20 ELU-MCG/ML injection      valACYclovir (VALTREX) 1000 MG tablet Take 1,000 mg by mouth 2 (two) times daily as needed (fever blisters).     VITAMIN D, ERGOCALCIFEROL, PO Take 1,000 Units by mouth daily.      No current facility-administered medications for this visit.  Physical Exam: Vitals:   09/28/20 1008  BP: (!) 144/82  Pulse: 76  SpO2: 97%  Weight: 153 lb 12.8 oz (69.8 kg)  Height: 5' 1.5" (1.562 m)    GEN- The patient is chronically ill appearing, alert and oriented x 3 today.   Head- normocephalic, atraumatic Eyes-  Sclera clear, conjunctiva pink Ears- hearing intact Oropharynx- clear Lungs- markedly reduced R sided BS,  normal work of breathing Chest- pacemaker pocket is well healed Heart- Regular rate and rhythm, no murmurs, rubs or gallops, PMI not laterally displaced GI- soft, NT, ND, + BS Extremities- no clubbing, cyanosis, or edema  Pacemaker interrogation- reviewed in detail today,  See PACEART report  ekg tracing ordered today is personally reviewed and shows a paced,  demand V pacing, PVCs  Echo 11/16/19- EF 55%, RVSP 52 mm Hg,  moderate biatrial enlargement  Myoview 11/16/19- EF 71%, normal study  Assessment and Plan:  1. Symptomatic bradycardia  Normal pacemaker function See Pace Art report No changes today she is not device dependant today  2. Paroxysmal atrial fibrillation Afib burden is 8.7 % (previously 4%) She is on eliquis for stroke prevention She has not recently had afib to correlate to worsening SOB.  3. HTN Stable No change required today  4. Acute on chronic diastolic dysfunction Voluem overloaded today Bmet, pro BNP ordered today Sodium restriction is advised Compliance with lasix advised  I have spoken with Dr Joylene Draft who knows her well.  We will arrange CXR to evaluate reduced R lung  sounds through his office.  She already has scheduled follow-up with him in 2 weeks.  We will see again in 3 months unless CXR warrants more acute follow-up  Risks, benefits and potential toxicities for medications prescribed and/or refilled reviewed with patient today.   Thompson Grayer MD, [facility] 09/28/2020 10:13 AM

## 2020-09-29 LAB — BASIC METABOLIC PANEL
BUN/Creatinine Ratio: 18 (ref 12–28)
BUN: 14 mg/dL (ref 8–27)
CO2: 26 mmol/L (ref 20–29)
Calcium: 9.1 mg/dL (ref 8.7–10.3)
Chloride: 99 mmol/L (ref 96–106)
Creatinine, Ser: 0.78 mg/dL (ref 0.57–1.00)
Glucose: 91 mg/dL (ref 65–99)
Potassium: 3.5 mmol/L (ref 3.5–5.2)
Sodium: 140 mmol/L (ref 134–144)
eGFR: 78 mL/min/{1.73_m2} (ref >59)

## 2020-09-29 LAB — CBC
Hematocrit: 27.5 % — ABNORMAL LOW (ref 34.0–46.6)
Hemoglobin: 8.8 g/dL — ABNORMAL LOW (ref 11.1–15.9)
MCH: 28.7 pg (ref 26.6–33.0)
MCHC: 32 g/dL (ref 31.5–35.7)
MCV: 90 fL (ref 79–97)
Platelets: 208 10*3/uL (ref 150–450)
RBC: 3.07 x10E6/uL — ABNORMAL LOW (ref 3.77–5.28)
RDW: 14.5 % (ref 11.7–15.4)
WBC: 5.6 10*3/uL (ref 3.4–10.8)

## 2020-09-29 LAB — PRO B NATRIURETIC PEPTIDE: NT-Pro BNP: 744 pg/mL — ABNORMAL HIGH (ref 0–738)

## 2020-10-01 ENCOUNTER — Telehealth: Payer: Self-pay | Admitting: *Deleted

## 2020-10-01 DIAGNOSIS — J90 Pleural effusion, not elsewhere classified: Secondary | ICD-10-CM

## 2020-10-01 MED ORDER — FUROSEMIDE 40 MG PO TABS: ORAL_TABLET | ORAL | 3 refills | Status: DC

## 2020-10-01 NOTE — Telephone Encounter (Signed)
Patient had mod effusion on Chest xray ordered after OV on 8/26. Appointment made with Jonni Sanger next week for follow up. On 8/26 the patient had been taking lasix 40 mg, 1 tablet am and 1/2 tablet pm. Allred advised to take medication as prescribed taking 1 tablet am and 1 tablet pm. After xray result Dr. Rayann Heman increased dose to 1.5 tablets in the am and 1 table in the pm. Patient verbalized understanding.

## 2020-10-04 ENCOUNTER — Other Ambulatory Visit: Payer: Self-pay | Admitting: Neurology

## 2020-10-09 ENCOUNTER — Telehealth: Payer: Self-pay | Admitting: Physician Assistant

## 2020-10-09 NOTE — Telephone Encounter (Signed)
Dr. Rayann Heman You saw this patient on 09/28/20 for follow up and preop clearance. It looks like you recommended a CXR for symptoms, but I do not see these results. Can you please make recommendations regarding clearance?

## 2020-10-09 NOTE — Progress Notes (Signed)
Assessment/Plan:   1.  Parkinsons Disease  -Continue carbidopa/levodopa 25/100, 1.5 tablets 3 times per day.    -Continue carbidopa/levodopa 50/200 at bedtime  -Continue rasagiline, 1 mg daily  -Has moderate Parkinson's dyskinesia, but really not bothersome to her and she does not even notice it most of the time.  Therefore, no treatment required.  2.  RBD/insomnia  -On clonazepam, 0.5 mg, half tablet at bedtime.  -add melatonin, 3 mg at bed. 3.  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 4.  Atrial fibrillation  -Follows with cardiology.  On Eliquis.  5.  Constipation  -Patient using Colace and MiraLAX.  -Talk to her about the importance of increasing her water intake.  This is troublesome for her because she restarted her Lasix, but she agrees that it does help when she increases water.  Subjective:   [name] [name] was seen today in follow up for Parkinsons disease.  My previous records were reviewed prior to todays visit as well as outside records available to me.  Talked last visit about not taking medication twice daily, but 3 times daily at specific times.  She reports that she is doing that.  No falls since last visit.  Notes some start hesitation, worse if tired or stressed/in crowd.  No confusion/hallucinations.  Occ dyskinesia.  Just saw cardiology August 26.  Notes are reviewed.  She was having symptomatic bradycardia with normal pacemaker function.  She also was volume overloaded with CHF and it was advised that she take her Lasix as prescribed and they ordered a chest x-ray, and she already had a follow-up with Dr. Joylene Draft.  I do not see any results (or even orders) for that chest x-ray but she reports she had it done at PCP office and it showed some fluid.  She does feel better and is much less SOB.     Current prescribed movement disorder medications: Carbidopa/levodopa 25/100, 1.5 tablet 3 times per  day Carbidopa/levodopa 50/200 at bedtime azilect Klonopin, 1/2 po q hs (PDMP reveals no red flags.  Last filled July 08, 2020) - now taking regularly and doing better but wonders if she needs higher dosage    ALLERGIES:   Allergies  Allergen Reactions   Statins     Leg weakness - but tolerating low dose Crestor every other day    CURRENT MEDICATIONS:  Outpatient Encounter Medications as of 10/10/2020  Medication Sig   apixaban (ELIQUIS) 5 MG TABS tablet Take 1 tablet (5 mg total) by mouth 2 (two) times daily.   CALCIUM PO Take 1 tablet by mouth at bedtime.   Calcium Polycarbophil (FIBERCON PO) Take 2 tablets by mouth every morning.   carbidopa-levodopa (SINEMET CR) 50-200 MG tablet TAKE 1 TABLET BY MOUTH EVERYDAY AT BEDTIME   carbidopa-levodopa (SINEMET IR) 25-100 MG tablet TAKE 1 AND 1/2 TABLETS BY MOUTH THREE TIMES A DAY   clonazePAM (KLONOPIN) 0.5 MG tablet Take 0.5 tablets (0.25 mg total) by mouth at bedtime.   Coenzyme Q10 (CO Q 10 PO) Take 1 tablet by mouth every morning.   COVID-19 mRNA Vac-TriS, Pfizer, (PFIZER-BIONT COVID-19 VAC-TRIS) SUSP injection Inject into the muscle.   cyanocobalamin (,VITAMIN B-12,) 1000 MCG/ML injection INJECT 1ML INTRAMUSCULARLY IN THE DELTOID ONCE A MONTH (ALTERNATING DELT. EACH MONTH)   cyanocobalamin 100 MCG tablet Take 100 mcg by mouth daily.   Docusate Sodium (COLACE PO) Take 1 capsule by mouth 2 (two) times daily as  needed (constipation).   ezetimibe (ZETIA) 10 MG tablet Take 10 mg by mouth every other day. Taking the same day as the crestor   furosemide (LASIX) 40 MG tablet Take 1.5 tablets (60 mg total) by mouth every morning AND 1 tablet (40 mg total) every evening.   levothyroxine (SYNTHROID) 50 MCG tablet Take 50 mcg by mouth daily.   Magnesium Oxide 500 MG TABS Take by mouth daily.   metoprolol succinate (TOPROL-XL) 25 MG 24 hr tablet TAKE 1 TABLET BY MOUTH DAILY. PLEASE MAKE OVERDUE APPT WITH DR. ALLRED BEFORE ANYMORE REFILLS.    multivitamin-lutein (OCUVITE-LUTEIN) CAPS capsule Take 1 capsule by mouth daily.   polyethylene glycol (MIRALAX / GLYCOLAX) packet Take 17 g by mouth 2 (two) times daily. (Patient taking differently: Take 17 g by mouth daily as needed.)   potassium chloride (K-DUR,KLOR-CON) 10 MEQ tablet Take 10 mEq by mouth 2 (two) times daily.    raloxifene (EVISTA) 60 MG tablet Take 60 mg by mouth daily.   rasagiline (AZILECT) 1 MG TABS tablet Take 1 tablet (1 mg total) by mouth daily.   rosuvastatin (CRESTOR) 5 MG tablet Take 2.5 mg by mouth every other day.   TWINRIX 720-20 ELU-MCG/ML injection    VITAMIN D, ERGOCALCIFEROL, PO Take 1,000 Units by mouth daily.    PROGESTERONE PO Take 25 mg by mouth at bedtime. (Patient not taking: Reported on 10/10/2020)   valACYclovir (VALTREX) 1000 MG tablet Take 1,000 mg by mouth 2 (two) times daily as needed (fever blisters). (Patient not taking: Reported on 10/10/2020)   No facility-administered encounter medications on file as of 10/10/2020.    Objective:   PHYSICAL EXAMINATION:    VITALS:   Vitals:   10/10/20 0951  BP: 122/61  Pulse: 81  SpO2: 93%  Weight: 140 lb 12.8 oz (63.9 kg)  Height: '5\' 1"'$  (1.549 m)     GEN:  The patient appears stated age and is in NAD. HEENT:  Normocephalic, atraumatic.  The mucous membranes are moist. The superficial temporal arteries are without ropiness or tenderness. CV:  RRR Lungs:  CTAB Neck/HEME:  There are no carotid bruits bilaterally.  Neurological examination:  Orientation: The patient is alert and oriented x3. Cranial nerves: There is good facial symmetry with facial hypomimia. The speech is fluent and clear. Soft palate rises symmetrically and there is no tongue deviation. Hearing is intact to conversational tone. Sensation: Sensation is intact to light touch throughout Motor: Strength is at least antigravity x4.  Movement examination: Tone: There is nl tone in the ue/le Abnormal movements: there is mild to mod  dyskinesia, L and more mild on the R Coordination:  There is mild decremation with finger taps on the L Gait and Station: The patient has no difficulty arising out of a deep-seated chair without the use of the hands. The patient's stride length is good.  The patient has a neg pull test.       Total time spent on today's visit was 34  minutes, including both face-to-face time and nonface-to-face time.  Time included that spent on review of records (prior notes available to me/labs/imaging if pertinent), discussing treatment and goals, answering patient's questions and coordinating care.  Cc:  Crist Infante, MD

## 2020-10-10 ENCOUNTER — Other Ambulatory Visit: Payer: Self-pay

## 2020-10-10 ENCOUNTER — Encounter: Payer: Self-pay | Admitting: Neurology

## 2020-10-10 ENCOUNTER — Ambulatory Visit (INDEPENDENT_AMBULATORY_CARE_PROVIDER_SITE_OTHER): Payer: Medicare Other | Admitting: Neurology

## 2020-10-10 VITALS — BP 122/61 | HR 81 | Ht 61.0 in | Wt 140.8 lb

## 2020-10-10 DIAGNOSIS — I10 Essential (primary) hypertension: Secondary | ICD-10-CM | POA: Diagnosis not present

## 2020-10-10 DIAGNOSIS — G249 Dystonia, unspecified: Secondary | ICD-10-CM

## 2020-10-10 DIAGNOSIS — G20 Parkinson's disease: Secondary | ICD-10-CM | POA: Diagnosis not present

## 2020-10-10 DIAGNOSIS — D62 Acute posthemorrhagic anemia: Secondary | ICD-10-CM | POA: Diagnosis not present

## 2020-10-10 DIAGNOSIS — R5383 Other fatigue: Secondary | ICD-10-CM | POA: Diagnosis not present

## 2020-10-10 DIAGNOSIS — E039 Hypothyroidism, unspecified: Secondary | ICD-10-CM | POA: Diagnosis not present

## 2020-10-10 DIAGNOSIS — D649 Anemia, unspecified: Secondary | ICD-10-CM | POA: Diagnosis not present

## 2020-10-10 MED ORDER — CARBIDOPA-LEVODOPA 25-100 MG PO TABS
1.5000 | ORAL_TABLET | Freq: Three times a day (TID) | ORAL | 1 refills | Status: DC
Start: 2020-10-10 — End: 2021-04-15

## 2020-10-10 MED ORDER — RASAGILINE MESYLATE 1 MG PO TABS: 1.0000 mg | ORAL_TABLET | Freq: Every day | ORAL | 1 refills | Status: DC

## 2020-10-10 MED ORDER — CLONAZEPAM 0.5 MG PO TABS: 0.2500 mg | ORAL_TABLET | Freq: Every day | ORAL | 1 refills | Status: DC

## 2020-10-10 MED ORDER — CARBIDOPA-LEVODOPA ER 50-200 MG PO TBCR: EXTENDED_RELEASE_TABLET | ORAL | 1 refills | Status: DC

## 2020-10-10 NOTE — Progress Notes (Deleted)
Electrophysiology Office Note Date: 10/10/2020  ID:  Billee, Macker 11/13/1940, MRN [id number]  PCP: Crist Infante, MD Primary Cardiologist: Mertie Moores, MD Electrophysiologist: Thompson Grayer, MD ***  CC: Pacemaker follow-up  [name] [name] is a 80 y.o. female seen today for Thompson Grayer, MD for {Blank single:19197::"cardiac clearance","post hospital follow up","acute visit due to ***","routine electrophysiology followup"}.  Since {Blank single:19197::"last being seen in our clinic","discharge from hospital"} the patient reports doing ***.  she denies chest pain, palpitations, dyspnea, PND, orthopnea, nausea, vomiting, dizziness, syncope, edema, weight gain, or early satiety.  Device History: {Blank single:19197::"Medtronic","St. Jude","Boston Scientific","Biotronik"} {Blank single:19197::"Dual Chamber","Single Chamber","BiV","Leadless"} PPM implanted *** for ***  Past Medical History:  Diagnosis Date   Anemia    Arthritis    thumbs   Atrial tachycardia (HCC)    Atypical atrial flutter (HCC)    Cataract    bilateral   Chronic anticoagulation    on Xarelto   Chronic diastolic CHF (congestive heart failure) ([place])    Echocardiogram (11/24/12): Mild LVH, EF 60%.   Dysrhythmia    GERD (gastroesophageal reflux disease)    hx of no meds   Heart murmur    slight   Hyperlipidemia    a. Hx of leg weakness while on statin. under control   Hypertension    Hypothyroidism    Leg fracture Dec 21,2011   Osteoporosis 07/2007   Parkinson's disease ([place]) 2011   Persistent atrial fibrillation ([place])    s/p atrial fib ablation 11-11-11.    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[place])    Squamous carcinoma summer 2015   back of left thigh   Wrist fracture    right   Past Surgical History:  Procedure Laterality  Date   ATRIAL FIBRILLATION ABLATION N/A 11/11/2011   Procedure: ATRIAL FIBRILLATION ABLATION;  Surgeon: Thompson Grayer, MD;  [facility] C[facility] LAB;  Service: Cardiovascular;  Laterality: N/A;   BREAST BIOPSY  1998   BREAST SURGERY     benign cyst removed   CARDIAC ELECTROPHYSIOLOGY STUDY AND ABLATION  5/14, 7/14   convergent AF ablation at [facility] and subsequent atypical atrial flutter ablation by Dr Lehman Prom   CARDIOVASCULAR STRESS TEST  06-06-2008   EF 73%   CARDIOVERSION  09/09/2011   Procedure: CARDIOVERSION;  Surgeon: Darlin Coco, MD;  Location: [facility];  Service: Cardiovascular;  Laterality: N/A;  to be done by dr. Mare Ferrari   CARDIOVERSION  02/18/2012   Procedure: CARDIOVERSION;  Surgeon: Thayer Headings, MD;  Location: [facility] ENDOSCOPY;  Service: Cardiovascular;  Laterality: N/A;   [place]  2011   metal pin in place   FOREARM SURGERY Left 2010   "opened arm to drain it"   INSERTION OF MESH N/A 11/24/2013   Procedure: INSERTION OF MESH;  Surgeon: Solyana Nonaka Boston, MD;  Location: [facility] ORS;  Service: General;  Laterality: N/A;   KNEE SURGERY Left 2001   Left unicompartmental knee     Dr. Alvan Dame 11/18/16   PACEMAKER PLACEMENT  10/20/12   MDT Advisa DR implanted by Dr Lehman Prom at [place] Left 11/18/2016   Procedure: LEFT UNICOMPARTMENTAL KNEE Medially;  Surgeon: Paralee Cancel, MD;  Location: [facility] ORS;  Service: Orthopedics;  Laterality: Left;  90 mins   SKIN  CANCER DESTRUCTION  summer 2015   TEE WITHOUT CARDIOVERSION  11/10/2011   Procedure: TRANSESOPHAGEAL ECHOCARDIOGRAM (TEE);  Surgeon: Thayer Headings, MD;  Location: [facility];  Service: Cardiovascular;  Laterality: N/A;   TONSILLECTOMY  as child   US ECHOCARDIOGRAPHY  03-10-2008   Est EF 55-60%, Dr. Cathie Olden   VENTRAL HERNIA REPAIR N/A 11/24/2013   Procedure: LAPAROSCOPIC VENTRAL HERNIA;  Surgeon: Pama Roskos [place], MD;  Location: [facility]S;  Service: General;  Laterality: N/A;   WRIST  SURGERY Right ~2009   metal rod in place    Current Outpatient Medications  Medication Sig Dispense Refill   apixaban (ELIQUIS) 5 MG TABS tablet Take 1 tablet (5 mg total) by mouth 2 (two) times daily. 60 tablet 5   CALCIUM PO Take 1 tablet by mouth at bedtime.     Calcium Polycarbophil (FIBERCON PO) Take 2 tablets by mouth every morning.     carbidopa-levodopa (SINEMET CR) 50-200 MG tablet TAKE 1 TABLET BY MOUTH EVERYDAY AT BEDTIME 90 tablet 1   carbidopa-levodopa (SINEMET IR) 25-100 MG tablet Take 1.5 tablets by mouth 3 (three) times daily. 405 tablet 1   clonazePAM (KLONOPIN) 0.5 MG tablet Take 0.5 tablets (0.25 mg total) by mouth at bedtime. 45 tablet 1   Coenzyme Q10 (CO Q 10 PO) Take 1 tablet by mouth every morning.     COVID-19 mRNA Vac-TriS, Pfizer, (PFIZER-BIONT COVID-19 VAC-TRIS) SUSP injection Inject into the muscle. 0.3 mL 0   cyanocobalamin (,VITAMIN B-12,) 1000 MCG/ML injection INJECT 1ML INTRAMUSCULARLY IN THE DELTOID ONCE A MONTH (ALTERNATING DELT. EACH MONTH)     cyanocobalamin 100 MCG tablet Take 100 mcg by mouth daily.     Docusate Sodium (COLACE PO) Take 1 capsule by mouth 2 (two) times daily as needed (constipation).     ezetimibe (ZETIA) 10 MG tablet Take 10 mg by mouth every other day. Taking the same day as the crestor     furosemide (LASIX) 40 MG tablet Take 1.5 tablets (60 mg total) by mouth every morning AND 1 tablet (40 mg total) every evening. 225 tablet 3   levothyroxine (SYNTHROID) 50 MCG tablet Take 50 mcg by mouth daily.     Magnesium Oxide 500 MG TABS Take by mouth daily.     metoprolol succinate (TOPROL-XL) 25 MG 24 hr tablet TAKE 1 TABLET BY MOUTH DAILY. PLEASE MAKE OVERDUE APPT WITH DR. ALLRED BEFORE ANYMORE REFILLS. 90 tablet 3   multivitamin-lutein (OCUVITE-LUTEIN) CAPS capsule Take 1 capsule by mouth daily.     polyethylene glycol (MIRALAX / GLYCOLAX) packet Take 17 g by mouth 2 (two) times daily. (Patient taking differently: Take 17 g by mouth daily as  needed.) 14 each 0   potassium chloride (K-DUR,KLOR-CON) 10 MEQ tablet Take 10 mEq by mouth 2 (two) times daily.      PROGESTERONE PO Take 25 mg by mouth at bedtime. (Patient not taking: Reported on 10/10/2020)     raloxifene (EVISTA) 60 MG tablet Take 60 mg by mouth daily.  3   rasagiline (AZILECT) 1 MG TABS tablet Take 1 tablet (1 mg total) by mouth daily. 90 tablet 1   rosuvastatin (CRESTOR) 5 MG tablet Take 2.5 mg by mouth every other day.     TWINRIX 720-20 ELU-MCG/ML injection      valACYclovir (VALTREX) 1000 MG tablet Take 1,000 mg by mouth 2 (two) times daily as needed (fever blisters). (Patient not taking: Reported on 10/10/2020)     VITAMIN D, ERGOCALCIFEROL, PO Take 1,000 Units by  mouth daily.      No current facility-administered medications for this visit.    Allergies:   Statins   Social History: Social History   Socioeconomic History   Marital status: Married    Spouse name: Not on file   Number of children: 2   Years of education: Not on file   Highest education level: Master's degree (e.g., MA, MS, MEng, MEd, MSW, MBA)  Occupational History   Not on file  Tobacco Use   Smoking status: Never   Smokeless tobacco: Never  Vaping Use   Vaping Use: Never used  Substance and Sexual Activity   Alcohol use: No   Drug use: No   Sexual activity: Yes    Partners: Male    Birth control/protection: Post-menopausal  Other Topics Concern   Not on file  Social History Narrative   Lives in [place].  Retired Licensed conveyancer.   Social Determinants of Health   Financial Resource Strain: Not on file  Food Insecurity: Not on file  Transportation Needs: Not on file  Physical Activity: Not on file  Stress: Not on file  Social Connections: Not on file  Intimate Partner Violence: Not on file    Family History: Family History  Problem Relation Age of Onset   Alzheimer's disease Mother    Heart failure Father    Healthy Child    Healthy Child    Breast cancer Maternal Aunt       Review of Systems: All other systems reviewed and are otherwise negative except as noted above.  Physical Exam: There were no vitals filed for this visit.   GEN- The patient is well appearing, alert and oriented x 3 today.   HEENT: normocephalic, atraumatic; sclera clear, conjunctiva pink; hearing intact; oropharynx clear; neck supple  Lungs- Clear to ausculation bilaterally, normal work of breathing.  No wheezes, rales, rhonchi Heart- Regular rate and rhythm, no murmurs, rubs or gallops  GI- soft, non-tender, non-distended, bowel sounds present  Extremities- no clubbing or cyanosis. No edema MS- no significant deformity or atrophy Skin- warm and dry, no rash or lesion; PPM pocket well healed Psych- euthymic mood, full affect Neuro- strength and sensation are intact  PPM Interrogation- reviewed in detail today,  See PACEART report  EKG:  EKG {ACTION; IS/IS [id number] ordered today. Personal review of ekg ordered {Blank single:19197::"today","***"} shows ***   Recent Labs: 09/28/2020: BUN 14; Creatinine, Ser 0.78; Hemoglobin 8.8; NT-Pro BNP 744; Platelets 208; Potassium 3.5; Sodium 140   Wt Readings from Last 3 Encounters:  10/10/20 140 lb 12.8 oz (63.9 kg)  09/28/20 153 lb 12.8 oz (69.8 kg)  04/03/20 136 lb 6.4 oz (61.9 kg)     Other studies Reviewed: Additional studies/ records that were reviewed today include: Previous EP office notes, Previous remote checks, Most recent labwork.   Assessment and Plan:  Right pleural effusion By CXR 8/26 ***  2. Symptomatic bradycardia s/p Medtronic PPM  Normal PPM function at check 8/26. Not interrogated today.   3. PAF She is on eliquis for stroke prevention  4. Acute on chronic diastolic CHF Volume status ***  Current medicines are reviewed at length with the patient today.   The patient {ACTIONS; HAS/DOES NOT HAVE:19233} concerns regarding her medicines.  The following changes were made today:  {NONE  DEFAULTED:18576}  Labs/ tests ordered today include: *** No orders of the defined types were placed in this encounter.    Disposition:   Follow up with {Blank single:19197::"Dr. Allred","Dr. Arlan Organ. Klein","Dr.  Camnitz","Dr. Lambert","EP APP"} in *** {Blank single:19197::"Months","Weeks"}    Signed, Annamaria Helling  10/10/2020 1:31 PM  [place] [address] [phone] (office) [phone] (fax)

## 2020-10-10 NOTE — Patient Instructions (Addendum)
Add melatonin, 3 mg , to your clonazepam at bed.  Online Resources for Power over Parkinson's Group September 2022  Local [place] Online Groups  Power over [employer] Group :   Power Over Parkinson's Patient Education Group will be Wednesday, September 14th-*Hybrid meting*- in person at [facility] location and via [facility] at 2:00 pm.   Upcoming [employer] over [employer] Meetings:  2nd Wednesdays of the month at 2 pm:  August 10th, September 14th [place] at [email or web address] if interested in participating in this online group Parkinson's Care Partners Group:    3rd Mondays, Contact Misty Paladino Atypical Parkinsonian Patient Group:   4th Wednesdays, [place] If you are interested in participating in these online groups with Misty, please contact her directly for how to join those meetings.  Her contact information is [email or web address].   [place]:  [email or web address] PD Health at Home continues:  Mindfulness Mondays, Expert Briefing Tuesdays, Wellness Wednesdays, Take Time Thursdays, Fitness Fridays -Listings for June 2022 are on the website Upcoming Webinar:  Use it or Lose It-The Impact of Physical Activity in Parkinson's.  Wednesday, September 7th at 1 pm. Upcoming Webinar:  Understanding Gene and Cell-Based Therapies in Parkinson's.  Wednesday, October 5th at 1 pm Register for expert briefings (webinars) at [email or web address]  Please check out their website to sign up for emails and see their full online offerings  [place]:  [email or web address]  Upcoming Webinar:   Finding your Way:  Working through Emotions in the Early Years with Parkinson's.  Thursday, September 15th at 12 noon Check out additional information on their website to see their full online offerings  [place]:   [email or web address] Upcoming Webinar:  Pelvic Health and Living with Parkinson's.  Tuesday, September 20th at 3 pm.  Care Partner Monthly Meetup.  With Robin Searing [place]inney.  First Tuesday of each month, 2 pm Joy Breaks:  First Wednesday of each month, 2-3 pm. There will be art, doodling, making, crafting, listening, laughing, stories, and everything in between. No art experience necessary. No supplies required. Just show up for joy!  Register on their website. Check out additional information to Live Well Today on their website  Parkinson and Movement Disorders (PMD) Alliance:  www.[email or web address] NeuroLife Online:  Online Education Events Sign up for emails, which are sent weekly to give you updates on programming and online offerings  Parkinson's Association of the [place]:  www.[email or web address] Caring for Parkinson's, Caring for You Symposium, Saturday, September 10th.  [place], [place].  Symposium Registration - Parkinson Association of the [place] Information on online support groups, education events, and online exercises including Yoga, Parkinson's exercises and more-LOTS of information on links to PD resources and online events Virtual Support Group through Parkinson's Association of the [place]; next one is scheduled for Wednesday, October 5th at 2 pm. No September support group due to symposium.  (These are typically scheduled for the 1st Wednesday of the month at 2 pm).  Visit website for details.  Additional links for movement activities: Parkinson's DRUMMING Classes/Music Therapy with Doylene Canning:  This is a returning class!  2nd Mondays, beginning September 12th.  Contact Doylene Canning at [phone] or [email or web address] PWR! Moves Classes at [place] RESUMED!  Wednesdays 10 and 11 am.  Contact Amy Marriott, PT [email or web address] or [phone] if interested Here is a link to the PWR!Moves classes on Zoom from  [place] - Daily Mon-Sat at 10:00. Via Zoom, FREE and open to all.  There is also  a link below via Facebook if you use that platform. [email or web address] Parkinson's Wellness Recovery (PWR! Moves)  www.[email or web address] Info on the PWR! Virtual Experience:  You will have access to our expertise through self-assessment, guided plans that start with the PD-specific fundamentals, educational content, tips, Q&A with an expert, and a growing Art therapist of PD-specific pre-recorded and live exercise classes of varying types and intensity - both physical and cognitive! If that is not enough, we offer 1:1 wellness consultations (in-person or virtual) to personalize your PWR! Research scientist (medical).  [place] Fridays:  As part of the PD Health @ Home program, this free video series focuses each week on one aspect of fitness designed to support people living with Parkinson's.  These weekly videos highlight the [place] recent fitness guidelines for people with Parkinson's disease.  HollywoodSale.dk Dance for PD website is offering free, live-stream classes throughout the week, as well as links to [employer] of classes:  [email or web address] Dance for Parkinson's Class:  [place].  Free offering for people with Parkinson's and care partners; virtual class.  For more information, contact [phone] or email Ruffin Frederick at [email or web address] Virtual dance and Pilates for Parkinson's classes: Click on the Community Tab> Parkinson's Movement Initiative Tab.  To register for classes and for more information, visit [email or web address] and click the  "community" tab.  [employer] Parkinson's Cycling Classes  [employer]: 1pm on Fridays-Live classes at [employer] ([employer] at [email or web address] or [phone]) [employer]: Virtual Classes Mondays and Thursdays Jeanette Caprice classes Tuesday, Wednesday and Thursday (contact [place] at [email or web address]  or [phone])  [facility] Boxing Three levels of classes are offered Tuesdays and Thursdays:  10:30 am,  12 noon & 1:45 pm at [facility].  Active Stretching with Paula Compton Class starting in March, on Fridays To observe a class or for  more information, call [phone] or email [email or web address] Well[employer]: Online Caregiver Education Opportunities:  www.well-[email or web address]/caregiver-education/caregiver-support-group.  You may also contact Vickki Muff at [email or web address] or [phone].   Powerful Tools for Caregivers:  6-week program beginning Thursday, October 13th.  This six-week educational series designed to provide family caregivers with practical tools to care for themselves while caring for a loved one Unlocking Dementia through [facility], Humor, and Understanding:  5-week series beginning September 7th Well-Spring Navigator:  04-10-1997 program, a free service to help individuals and families through the journey of determining care for older adults.  The "Navigator" is a [employer], Education officer, museum, who will speak with a prospective client and/or loved ones to provide an assessment of the situation and a set of recommendations for a personalized care plan -- all free of charge, and whether Well[employer] offers the needed service or not. If the need is not a service we provide, we are well-connected with reputable programs in town that we can refer you to.  www.well-springsolutions.org or to speak with the Navigator, call [phone].

## 2020-10-11 ENCOUNTER — Ambulatory Visit: Payer: Medicare Other | Admitting: Student

## 2020-10-11 DIAGNOSIS — Z23 Encounter for immunization: Secondary | ICD-10-CM | POA: Diagnosis not present

## 2020-10-11 DIAGNOSIS — I5033 Acute on chronic diastolic (congestive) heart failure: Secondary | ICD-10-CM

## 2020-10-11 DIAGNOSIS — R7301 Impaired fasting glucose: Secondary | ICD-10-CM | POA: Diagnosis not present

## 2020-10-11 DIAGNOSIS — I480 Paroxysmal atrial fibrillation: Secondary | ICD-10-CM | POA: Diagnosis not present

## 2020-10-11 DIAGNOSIS — K7460 Unspecified cirrhosis of liver: Secondary | ICD-10-CM | POA: Diagnosis not present

## 2020-10-11 DIAGNOSIS — E039 Hypothyroidism, unspecified: Secondary | ICD-10-CM | POA: Diagnosis not present

## 2020-10-11 DIAGNOSIS — I10 Essential (primary) hypertension: Secondary | ICD-10-CM

## 2020-10-11 DIAGNOSIS — M810 Age-related osteoporosis without current pathological fracture: Secondary | ICD-10-CM | POA: Diagnosis not present

## 2020-10-11 DIAGNOSIS — R001 Bradycardia, unspecified: Secondary | ICD-10-CM

## 2020-10-11 DIAGNOSIS — D6869 Other thrombophilia: Secondary | ICD-10-CM | POA: Diagnosis not present

## 2020-10-11 DIAGNOSIS — D649 Anemia, unspecified: Secondary | ICD-10-CM | POA: Diagnosis not present

## 2020-10-11 DIAGNOSIS — G20 Parkinson's disease: Secondary | ICD-10-CM | POA: Diagnosis not present

## 2020-10-11 DIAGNOSIS — I509 Heart failure, unspecified: Secondary | ICD-10-CM | POA: Diagnosis not present

## 2020-10-11 DIAGNOSIS — E871 Hypo-osmolality and hyponatremia: Secondary | ICD-10-CM | POA: Diagnosis not present

## 2020-10-11 DIAGNOSIS — J90 Pleural effusion, not elsewhere classified: Secondary | ICD-10-CM | POA: Diagnosis not present

## 2020-10-11 DIAGNOSIS — I4819 Other persistent atrial fibrillation: Secondary | ICD-10-CM

## 2020-10-12 NOTE — Telephone Encounter (Signed)
Discussed with [facility].  They plan to reschedule her GI procedure to a later date.  Patient was seen Joesph July in the EP office next Tuesday for reassessment of volume overload and possible pleural effusion.  Will defer final clearance to EP service.  Callback pool, please inform patient.

## 2020-10-12 NOTE — Telephone Encounter (Signed)
Call placed to pt regarding surgical clearance information below, left a message for her to call back and ask for the Preop team.

## 2020-10-12 NOTE — Telephone Encounter (Addendum)
Dr. Rayann Heman is off today.  Unfortunately, the reason behind the last office visit was not updated to include preop clearance, therefore this was not addressed during the last visit.  Patient's GI procedure scheduled for 7:30 Tuesday morning, however at the same time he is scheduled to see Oda Kilts for moderate effusion on chest x-ray at 8 AM next Tuesday.  To schedule a clearly in conflict with each other.  Since the patient was volume overloaded during the last cardiology office visit, I think it is important to make sure she is breathing okay prior to proceeding with GI procedure.  Her GI procedure is due to heme positive GI bleed with anemia.  We will check with GI service to see if the procedure can be pushed back by a few days.  Note, Dr. Harlene Ramus, the patient's GI physician is retiring near the end of this month.

## 2020-10-12 NOTE — Telephone Encounter (Signed)
  Freda Munro with DR. Osborn Coho office calling to f/u, she said they need to know what the instructions for pt's eliquis since procedure is on Tuesday. They need to know if pt needs to hole it today.

## 2020-10-15 NOTE — Progress Notes (Signed)
Electrophysiology Office Note Date: 10/16/2020  ID:  [name], [name] 08/25/1940, MRN [id number]  PCP: Crist Infante, MD Primary Cardiologist: Mertie Moores, MD Electrophysiologist: Thompson Grayer, MD   CC: Pacemaker follow-up  [name] [name] is a 80 y.o. female seen today for Thompson Grayer, MD for routine electrophysiology followup.  Since last being seen in our clinic the patient reports doing much better. Her SOB has gradually improved on higher dose lasix. She takes one dose just after waking, and the other around 1400. She remains mildly SOB up stairs or inclines, but this is near baseline.  she denies chest pain, palpitations,  PND, nausea, vomiting, dizziness, syncope,  weight gain, or early satiety. Mild edema R ankle 2/2 fall.  Device History: MDT dual chamber PPM implanted 10/20/2012   AFib Hx Diagnosed 2006 PVI ablation 11/11/2011, Dr. Rayann Heman AAD HX Flecainide> amiodarone >> rate control strategy  Past Medical History:  Diagnosis Date   Anemia    Arthritis    thumbs   Atrial tachycardia (HCC)    Atypical atrial flutter (HCC)    Cataract    bilateral   Chronic anticoagulation    on Xarelto   Chronic diastolic CHF (congestive heart failure) ([place])    Echocardiogram (11/24/12): Mild LVH, EF 60%.   Dysrhythmia    GERD (gastroesophageal reflux disease)    hx of no meds   Heart murmur    slight   Hyperlipidemia    a. Hx of leg weakness while on statin. under control   Hypertension    Hypothyroidism    Leg fracture Dec 21,2011   Osteoporosis 07/2007   Parkinson's disease ([place]) 2011   Persistent atrial fibrillation ([place])    s/p atrial fib ablation 11-11-11.    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[place])    Squamous carcinoma summer 2015   back of left thigh   Wrist fracture    right    Past Surgical History:  Procedure Laterality Date   ATRIAL FIBRILLATION ABLATION N/A 11/11/2011   Procedure: ATRIAL FIBRILLATION ABLATION;  Surgeon: Thompson Grayer, MD;  Location: [facility] CATH LAB;  Service: Cardiovascular;  Laterality: N/A;   BREAST BIOPSY  1998   BREAST SURGERY     benign cyst removed   CARDIAC ELECTROPHYSIOLOGY STUDY AND ABLATION  5/14, 7/14   convergent AF ablation at [facility] and subsequent atypical atrial flutter ablation by Dr Lehman Prom   CARDIOVASCULAR STRESS TEST  06-06-2008   EF 73%   CARDIOVERSION  09/09/2011   Procedure: CARDIOVERSION;  Surgeon: Darlin Coco, MD;  Location: [name] ENDOSCOPY;  Service: Cardiovascular;  Laterality: N/A;  to be done by dr. Mare Ferrari   CARDIOVERSION  02/18/2012   Procedure: CARDIOVERSION;  Surgeon: Thayer Headings, MD;  Location: [facility] ENDOSCOPY;  Service: Cardiovascular;  Laterality: N/A;   [place]  2011   metal pin in place   FOREARM SURGERY Left 2010   "opened arm to drain it"   INSERTION OF MESH N/A 11/24/2013   Procedure: INSERTION OF MESH;  Surgeon: Suesan Mohrmann [place], MD;  Location: [facility] ORS;  Service: General;  Laterality: N/A;   KNEE SURGERY Left 2001   Left unicompartmental knee     Dr. Alvan Dame 11/18/16   PACEMAKER PLACEMENT  10/20/12   MDT Advisa DR implanted by  Dr Lehman Prom at [place] Left 11/18/2016   Procedure: LEFT UNICOMPARTMENTAL KNEE Medially;  Surgeon: Paralee Cancel, MD;  Location: [facility] ORS;  Service: Orthopedics;  Laterality: Left;  90 mins   SKIN CANCER DESTRUCTION  summer 2015   TEE WITHOUT CARDIOVERSION  11/10/2011   Procedure: TRANSESOPHAGEAL ECHOCARDIOGRAM (TEE);  Surgeon: Thayer Headings, MD;  Location: [place];  Service: Cardiovascular;  Laterality: N/A;   TONSILLECTOMY  as child   US ECHOCARDIOGRAPHY  03-10-2008   Est EF 55-60%, Dr. Cathie Olden   VENTRAL HERNIA REPAIR N/A 11/24/2013   Procedure: LAPAROSCOPIC VENTRAL HERNIA;  Surgeon: Tondra Reierson [place], MD;  Location: [facility] ORS;   Service: General;  Laterality: N/A;   WRIST SURGERY Right ~2009   metal rod in place    Current Outpatient Medications  Medication Sig Dispense Refill   apixaban (ELIQUIS) 5 MG TABS tablet Take 1 tablet (5 mg total) by mouth 2 (two) times daily. 60 tablet 5   CALCIUM PO Take 1 tablet by mouth at bedtime.     Calcium Polycarbophil (FIBERCON PO) Take 2 tablets by mouth every morning.     carbidopa-levodopa (SINEMET CR) 50-200 MG tablet TAKE 1 TABLET BY MOUTH EVERYDAY AT BEDTIME 90 tablet 1   carbidopa-levodopa (SINEMET IR) 25-100 MG tablet Take 1.5 tablets by mouth 3 (three) times daily. 405 tablet 1   clonazePAM (KLONOPIN) 0.5 MG tablet Take 0.5 tablets (0.25 mg total) by mouth at bedtime. 45 tablet 1   Coenzyme Q10 (CO Q 10 PO) Take 1 tablet by mouth every morning.     COVID-19 mRNA Vac-TriS, Pfizer, (PFIZER-BIONT COVID-19 VAC-TRIS) SUSP injection Inject into the muscle. 0.3 mL 0   cyanocobalamin (,VITAMIN B-12,) 1000 MCG/ML injection INJECT 1ML INTRAMUSCULARLY IN THE DELTOID ONCE A MONTH (ALTERNATING DELT. EACH MONTH)     cyanocobalamin 100 MCG tablet Take 100 mcg by mouth daily.     Docusate Sodium (COLACE PO) Take 1 capsule by mouth 2 (two) times daily as needed (constipation).     ezetimibe (ZETIA) 10 MG tablet Take 10 mg by mouth every other day. Taking the same day as the crestor     furosemide (LASIX) 40 MG tablet Take 1.5 tablets (60 mg total) by mouth every morning AND 1 tablet (40 mg total) every evening. 225 tablet 3   levothyroxine (SYNTHROID) 50 MCG tablet Take 50 mcg by mouth daily. Pt take 1 1/2 tablet on Monday and Thursday and 1 tablet the rest of the week     Magnesium Oxide 500 MG TABS Take by mouth daily.     metoprolol succinate (TOPROL-XL) 25 MG 24 hr tablet TAKE 1 TABLET BY MOUTH DAILY. PLEASE MAKE OVERDUE APPT WITH DR. ALLRED BEFORE ANYMORE REFILLS. 90 tablet 3   multivitamin-lutein (OCUVITE-LUTEIN) CAPS capsule Take 1 capsule by mouth daily.     polyethylene glycol  (MIRALAX / GLYCOLAX) packet Take 17 g by mouth 2 (two) times daily. (Patient taking differently: Take 17 g by mouth daily as needed.) 14 each 0   potassium chloride (K-DUR,KLOR-CON) 10 MEQ tablet Take 10 mEq by mouth 2 (two) times daily.      PROGESTERONE PO Take 25 mg by mouth at bedtime.     raloxifene (EVISTA) 60 MG tablet Take 60 mg by mouth daily.  3   rasagiline (AZILECT) 1 MG TABS tablet Take 1 tablet (1 mg total) by mouth daily. 90 tablet 1   rosuvastatin (CRESTOR) 5 MG tablet Take 2.5 mg by mouth every  other day.     TWINRIX 720-20 ELU-MCG/ML injection      valACYclovir (VALTREX) 1000 MG tablet Take 1,000 mg by mouth 2 (two) times daily as needed (fever blisters).     VITAMIN D, ERGOCALCIFEROL, PO Take 1,000 Units by mouth daily.      No current facility-administered medications for this visit.    Allergies:   Statins   Social History: Social History   Socioeconomic History   Marital status: Married    Spouse name: Not on file   Number of children: 2   Years of education: Not on file   Highest education level: Master's degree (e.g., MA, MS, MEng, MEd, MSW, MBA)  Occupational History   Not on file  Tobacco Use   Smoking status: Never   Smokeless tobacco: Never  Vaping Use   Vaping Use: Never used  Substance and Sexual Activity   Alcohol use: No   Drug use: No   Sexual activity: Yes    Partners: Male    Birth control/protection: Post-menopausal  Other Topics Concern   Not on file  Social History Narrative   Lives in [place].  Retired Licensed conveyancer.   Social Determinants of Health   Financial Resource Strain: Not on file  Food Insecurity: Not on file  Transportation Needs: Not on file  Physical Activity: Not on file  Stress: Not on file  Social Connections: Not on file  Intimate Partner Violence: Not on file    Family History: Family History  Problem Relation Age of Onset   Alzheimer's disease Mother    Heart failure Father    Healthy Child    Healthy  Child    Breast cancer Maternal Aunt      Review of Systems: All other systems reviewed and are otherwise negative except as noted above.  Physical Exam: Vitals:   10/16/20 0758  BP: 108/72  Pulse: 68  SpO2: 98%  Weight: 142 lb 3.2 oz (64.5 kg)  Height: '5\' 1"'$  (1.549 m)     GEN- The patient is well appearing, alert and oriented x 3 today.   HEENT: normocephalic, atraumatic; sclera clear, conjunctiva pink; hearing intact; oropharynx clear; neck supple  Lungs- Clear to ausculation bilaterally, normal work of breathing.  No wheezes, rales, rhonchi Heart- Regular rate and rhythm, no murmurs, rubs or gallops  GI- soft, non-tender, non-distended, bowel sounds present  Extremities- no clubbing or cyanosis. No edema MS- no significant deformity or atrophy Skin- warm and dry, no rash or lesion; PPM pocket well healed Psych- euthymic mood, full affect Neuro- strength and sensation are intact  PPM Interrogation- Not interrogated today.   EKG:  EKG is not ordered today.  Recent Labs: 09/28/2020: BUN 14; Creatinine, Ser 0.78; Hemoglobin 8.8; NT-Pro BNP 744; Platelets 208; Potassium 3.5; Sodium 140   Wt Readings from Last 3 Encounters:  10/16/20 142 lb 3.2 oz (64.5 kg)  10/10/20 140 lb 12.8 oz (63.9 kg)  09/28/20 153 lb 12.8 oz (69.8 kg)     Other studies Reviewed: Additional studies/ records that were reviewed today include: Previous EP office notes, Previous remote checks, Most recent labwork.   Assessment and Plan:  Right pleural effusion By CXR 8/26 Lung sounds clear today with only slightly diminished R basilar sounds with good air flow.  Her symptoms of SOB have also significantly improved. Denies orthopnea.   2. Symptomatic bradycardia s/p Medtronic PPM  Normal PPM function at check 8/26. Not interrogated today.   3. PAF She is on eliquis for  stroke prevention  4. Acute on chronic diastolic CHF Volume status improved.  Continue lasix 60 mg q am and 40 mg q pm.   BMET and Mg today.   5. Cardiac Clearance For Colonoscopy Echo and Stress Test WNL 11/2019 Pt has a low risk of perioperative complications from a cardiac perspective by the Revised Cardiac Risk Index [place] Criteria).  She is OK to proceed without further cardiac work up. She understands to contact us if her symptoms change prior to her colonoscopy.   Current medicines are reviewed at length with the patient today.   The patient does not have concerns regarding her medicines.  The following changes were made today:  none  Labs/ tests ordered today include:  Orders Placed This Encounter  Procedures   Basic metabolic panel   Magnesium    Disposition:   Follow up with EP APP in 3 Months    Signed, Annamaria Helling  10/16/2020 8:15 AM  [facility] [address] [place] [address] [phone] (office) [phone] (fax)

## 2020-10-15 NOTE — Telephone Encounter (Signed)
Pt has been made aware of her procedure being put off until after she sees Oda Kilts, PA-C, tomorrow, 10/16/2020.  She stated that she was aware and thanked me for the call.

## 2020-10-16 ENCOUNTER — Encounter: Payer: Self-pay | Admitting: Student

## 2020-10-16 ENCOUNTER — Ambulatory Visit (INDEPENDENT_AMBULATORY_CARE_PROVIDER_SITE_OTHER): Payer: Medicare Other | Admitting: Student

## 2020-10-16 ENCOUNTER — Other Ambulatory Visit: Payer: Self-pay

## 2020-10-16 VITALS — BP 108/72 | HR 68 | Ht 61.0 in | Wt 142.2 lb

## 2020-10-16 DIAGNOSIS — I4819 Other persistent atrial fibrillation: Secondary | ICD-10-CM

## 2020-10-16 DIAGNOSIS — Z01818 Encounter for other preprocedural examination: Secondary | ICD-10-CM

## 2020-10-16 DIAGNOSIS — I10 Essential (primary) hypertension: Secondary | ICD-10-CM

## 2020-10-16 DIAGNOSIS — Z01810 Encounter for preprocedural cardiovascular examination: Secondary | ICD-10-CM

## 2020-10-16 DIAGNOSIS — J90 Pleural effusion, not elsewhere classified: Secondary | ICD-10-CM | POA: Diagnosis not present

## 2020-10-16 DIAGNOSIS — I5033 Acute on chronic diastolic (congestive) heart failure: Secondary | ICD-10-CM | POA: Diagnosis not present

## 2020-10-16 LAB — MAGNESIUM: Magnesium: 2 mg/dL (ref 1.6–2.3)

## 2020-10-16 LAB — BASIC METABOLIC PANEL
BUN/Creatinine Ratio: 23 (ref 12–28)
BUN: 21 mg/dL (ref 8–27)
CO2: 29 mmol/L (ref 20–29)
Calcium: 10.1 mg/dL (ref 8.7–10.3)
Chloride: 97 mmol/L (ref 96–106)
Creatinine, Ser: 0.9 mg/dL (ref 0.57–1.00)
Glucose: 103 mg/dL — ABNORMAL HIGH (ref 65–99)
Potassium: 3.6 mmol/L (ref 3.5–5.2)
Sodium: 141 mmol/L (ref 134–144)
eGFR: 65 mL/min/{1.73_m2} (ref >59)

## 2020-10-16 NOTE — Patient Instructions (Signed)
Medication Instructions:  Your physician recommends that you continue on your current medications as directed. Please refer to the Current Medication list given to you today.  *If you need a refill on your cardiac medications before your next appointment, please call your pharmacy*   Lab Work: TODAY: BMET, Mg  If you have labs (blood work) drawn today and your tests are completely normal, you will receive your results only by: . MyChart Message (if you have MyChart) OR . A paper copy in the mail If you have any lab test that is abnormal or we need to change your treatment, we will call you to review the results.    Follow-Up: At [facility], you and your health needs are our priority.  As part of our continuing mission to provide you with exceptional heart care, we have created designated Provider Care Teams.  These Care Teams include your primary Cardiologist (physician) and Advanced Practice Providers (APPs -  Physician Assistants and Nurse Practitioners) who all work together to provide you with the care you need, when you need it.  We recommend signing up for the patient portal called "MyChart".  Sign up information is provided on this After Visit Summary.  MyChart is used to connect with patients for Virtual Visits (Telemedicine).  Patients are able to view lab/test results, encounter notes, upcoming appointments, etc.  Non-urgent messages can be sent to your provider as well.   To learn more about what you can do with MyChart, go to [email or web address].    Your next appointment:   As scheduled  

## 2020-10-19 ENCOUNTER — Telehealth: Payer: Self-pay | Admitting: Student

## 2020-10-19 NOTE — Telephone Encounter (Signed)
  Please have her take an EXTRA 20 meq today but then otherwise continue meds as ordered.    Beryle Beams" [place], PA-C  10/17/2020 8:13 AM  Pt is aware of results and to take 1 extra 20 meq dose of potassium chloride today.  She states understanding and had no further questions at the time of the call.

## 2020-10-19 NOTE — Telephone Encounter (Signed)
Patient returning call for lab results.She states she is out of town and does not have good cell service, but will be back by Monday.

## 2020-10-29 ENCOUNTER — Other Ambulatory Visit: Payer: Self-pay | Admitting: Gastroenterology

## 2020-10-29 ENCOUNTER — Encounter: Payer: Medicare Other | Admitting: Internal Medicine

## 2020-10-29 DIAGNOSIS — Z86010 Personal history of colonic polyps: Secondary | ICD-10-CM | POA: Diagnosis not present

## 2020-10-29 DIAGNOSIS — R195 Other fecal abnormalities: Secondary | ICD-10-CM | POA: Diagnosis not present

## 2020-10-29 DIAGNOSIS — K7460 Unspecified cirrhosis of liver: Secondary | ICD-10-CM

## 2020-10-29 DIAGNOSIS — Z7901 Long term (current) use of anticoagulants: Secondary | ICD-10-CM | POA: Diagnosis not present

## 2020-11-02 DIAGNOSIS — E039 Hypothyroidism, unspecified: Secondary | ICD-10-CM | POA: Diagnosis not present

## 2020-11-02 DIAGNOSIS — I480 Paroxysmal atrial fibrillation: Secondary | ICD-10-CM | POA: Diagnosis not present

## 2020-11-02 DIAGNOSIS — E785 Hyperlipidemia, unspecified: Secondary | ICD-10-CM | POA: Diagnosis not present

## 2020-11-02 DIAGNOSIS — I10 Essential (primary) hypertension: Secondary | ICD-10-CM | POA: Diagnosis not present

## 2020-11-06 DIAGNOSIS — Z23 Encounter for immunization: Secondary | ICD-10-CM | POA: Diagnosis not present

## 2020-11-16 ENCOUNTER — Telehealth: Payer: Self-pay | Admitting: Internal Medicine

## 2020-11-16 NOTE — Telephone Encounter (Signed)
  1. Has your device fired? no  2. Is you device beeping? no  3. Are you experiencing draining or swelling at device site? no  4. Are you calling to see if we received your device transmission? She is trying to send a transmission but the machine keeps showing error (3230).  She is leaving out of town starting tomorrow and she will be out of the country starting Tuesday.   5. Have you passed out? no    Please route to [place]

## 2020-11-16 NOTE — Telephone Encounter (Signed)
Spoke with patient had her make sure the handheld piece was set firmly in the base and instructed her to wait about 72min to an hour to let handheld charge up and then try to send another transmission and if she was still getting an error code then to call medtronic, number to medtronic given to patient.

## 2020-12-03 DIAGNOSIS — I480 Paroxysmal atrial fibrillation: Secondary | ICD-10-CM | POA: Diagnosis not present

## 2020-12-03 DIAGNOSIS — I10 Essential (primary) hypertension: Secondary | ICD-10-CM | POA: Diagnosis not present

## 2020-12-03 DIAGNOSIS — E039 Hypothyroidism, unspecified: Secondary | ICD-10-CM | POA: Diagnosis not present

## 2020-12-03 DIAGNOSIS — M810 Age-related osteoporosis without current pathological fracture: Secondary | ICD-10-CM | POA: Diagnosis not present

## 2020-12-07 ENCOUNTER — Telehealth: Payer: Self-pay | Admitting: *Deleted

## 2020-12-07 DIAGNOSIS — I700 Atherosclerosis of aorta: Secondary | ICD-10-CM

## 2020-12-07 NOTE — Telephone Encounter (Signed)
   Pre-operative Risk Assessment    Patient Name: [name] [name]  DOB: 1940/02/05 MRN: [id number]      Request for Surgical Clearance   Procedure:   COLONOSCOPY/ENDOSCOPY  Date of Surgery: Clearance 01/03/21                                 Surgeon:  DR. Cristina Gong Surgeon's Group or Practice Name:  [facility] Phone number:  [phone] Fax number:  [phone]   Type of Clearance Requested: - Medical  - Pharmacy:  Hold Apixaban (Eliquis) [place] OF PROCEDURE   Type of Anesthesia:   Not Indicated   Additional requests/questions:   Jiles Prows   12/07/2020, 4:46 PM

## 2020-12-10 DIAGNOSIS — I700 Atherosclerosis of aorta: Secondary | ICD-10-CM | POA: Insufficient documentation

## 2020-12-10 NOTE — Telephone Encounter (Signed)
Patient with diagnosis of afib on Eliquis for anticoagulation.    Procedure: colonoscopy/endoscopy Date of procedure: 01/03/21  CHA2DS2-VASc Score = 6  This indicates a 9.7% annual risk of stroke. The patient's score is based upon: CHF History: 1 HTN History: 1 Diabetes History: 0 Stroke History: 0 Vascular Disease History: 1 (aortic atherosclerosis noted on abdominal CT) Age Score: 2 Gender Score: 1  CrCl 25mL/min Platelet count 189K  Per office protocol, patient can hold Eliquis for 1 day prior to procedure as requested.

## 2020-12-11 ENCOUNTER — Ambulatory Visit
Admission: RE | Admit: 2020-12-11 | Discharge: 2020-12-11 | Disposition: A | Discharge status: Home / Self Care | Payer: Medicare Other | Source: Ambulatory Visit | Attending: Gastroenterology | Admitting: Gastroenterology

## 2020-12-11 DIAGNOSIS — Z9049 Acquired absence of other specified parts of digestive tract: Secondary | ICD-10-CM | POA: Diagnosis not present

## 2020-12-11 DIAGNOSIS — K7460 Unspecified cirrhosis of liver: Secondary | ICD-10-CM

## 2020-12-11 DIAGNOSIS — I700 Atherosclerosis of aorta: Secondary | ICD-10-CM | POA: Diagnosis not present

## 2020-12-11 DIAGNOSIS — K449 Diaphragmatic hernia without obstruction or gangrene: Secondary | ICD-10-CM | POA: Diagnosis not present

## 2020-12-11 MED ORDER — IOPAMIDOL (ISOVUE-300) INJECTION 61%
100.0000 mL | Freq: Once | INTRAVENOUS | Status: AC | PRN
  Administered 2020-12-11: 100 mL via INTRAVENOUS

## 2020-12-11 NOTE — Telephone Encounter (Signed)
Will route clearance over to the requesting surgeon's office to make them aware and they will call pt with medication instructions.

## 2020-12-11 NOTE — Telephone Encounter (Signed)
Please inform patient to hold Eliquis for 1 day prior to the procedure and restart as soon as possible afterward at the surgeon's discretion.

## 2020-12-11 NOTE — Telephone Encounter (Signed)
    Patient Name: [name] [name]  DOB: 1940/05/06 MRN: [id number]  Primary Cardiologist: Mertie Moores, MD  Chart reviewed as part of pre-operative protocol coverage. Given past medical history and time since last visit, based on ACC/AHA guidelines, Zayra Devito Wierzbicki would be at acceptable risk for the planned procedure without further cardiovascular testing.   Per our clinical pharmacist, patient will need to hold Eliquis for 1 day prior to the procedure and restart as soon as possible afterward at the surgeon's discretion.  I will route this recommendation to the requesting party via Epic fax function and remove from pre-op pool.  Please call with questions.  [place], [place] 12/11/2020, 1:56 PM

## 2020-12-20 DIAGNOSIS — R5383 Other fatigue: Secondary | ICD-10-CM | POA: Diagnosis not present

## 2020-12-20 DIAGNOSIS — Z1152 Encounter for screening for COVID-19: Secondary | ICD-10-CM | POA: Diagnosis not present

## 2020-12-20 DIAGNOSIS — J029 Acute pharyngitis, unspecified: Secondary | ICD-10-CM | POA: Diagnosis not present

## 2020-12-20 DIAGNOSIS — I10 Essential (primary) hypertension: Secondary | ICD-10-CM | POA: Diagnosis not present

## 2020-12-20 DIAGNOSIS — R0981 Nasal congestion: Secondary | ICD-10-CM | POA: Diagnosis not present

## 2020-12-20 DIAGNOSIS — J069 Acute upper respiratory infection, unspecified: Secondary | ICD-10-CM | POA: Diagnosis not present

## 2020-12-20 DIAGNOSIS — I480 Paroxysmal atrial fibrillation: Secondary | ICD-10-CM | POA: Diagnosis not present

## 2020-12-20 DIAGNOSIS — K219 Gastro-esophageal reflux disease without esophagitis: Secondary | ICD-10-CM | POA: Diagnosis not present

## 2020-12-30 NOTE — Progress Notes (Signed)
Cardiology Office Note Date:  12/31/2020  Patient ID:  [name] [name], [name] [name] 06-15-1940, MRN [id number] PCP:  Crist Infante, MD  Electrophysiologist:  Dr. Ky Barban    Chief Complaint:  99mo  History of Present Illness: [name] [name] is a 80 y.o. female with history of HTN, HLD, Parkinson's disease  (Dr. 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 Osker Mason), chronic CHF (diastolic)  I saw her 0/35/59 She is doing well.  She is not a steady on her feet with the Parkinon's, though no stumbles, falls.  She denies any CP, occassionally has an awareness of her heart beat, but not fast, irregular.  No rest SOB, she sleeps weill with no symptoms or PND or orthopnea.  She has some baseline DOE that is unchanged. She was having PAFib by her EKGs/remotes pacer programmed MVP  She saw Dr. Rayann Heman in f/u Sept 2021 feeling well though mentioned some SOB, AF burden only 4%, was planned for stress and echo and if low risk could be cleared for EGD she was pending  Oct 2021: TTE with LVEF 55-60%, RVSP 52.5mmHg, stress as low risk, no ischemia/infarct  More recently she saw A. Tiller PA-c Sept 2022, SOB was improved though not completely resolved with adjustment in her lasix, no changes were made, she was cleared for colonoscopy  Pre-op pool and RPH have commented and cleared   TODAY She is doing "OK I guess", she is getting the colonoscopy 2/2 heme + stools, denies obvious bleeding or bleeding otherwise. She confirms that she has been said to be anemic for quite a long time, that is not new for her. She denies any CP, palpitations or cardiac awareness. She does say though that while generally for years she does not have much energy in the last month of so she will have an hour here and there that she feels somewhat suddenly really tired.  No t weak, not lightheaded, just tired and then for no  clear reason feels better/back to her baseline. No clear trigger, rhyme/reason to it. No fever, illness No changes to meds  PMD does labs     Device information MDT dual chamber PPM implanted 10/20/2012  AFib Hx Diagnosed 2006 PVI ablation 11/11/2011, Dr. Rayann Heman AAD HX Flecainide> amiodarone >> rate control strategy   Past Medical History:  Diagnosis Date   Anemia    Arthritis    thumbs   Atrial tachycardia (HCC)    Atypical atrial flutter (HCC)    Cataract    bilateral   Chronic anticoagulation    on Xarelto   Chronic diastolic CHF (congestive heart failure) ([place])    Echocardiogram (11/24/12): Mild LVH, EF 60%.   Dysrhythmia    GERD (gastroesophageal reflux disease)    hx of no meds   Heart murmur    slight   Hyperlipidemia    a. Hx of leg weakness while on statin. under control   Hypertension    Hypothyroidism    Leg fracture Dec 21,2011   Osteoporosis 07/2007   Parkinson's disease ([place]) 2011   Persistent atrial fibrillation ([place])    s/p atrial fib ablation 11-11-11.    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[place])    Squamous carcinoma summer 2015   back  of left thigh   Wrist fracture    right    Past Surgical History:  Procedure Laterality Date   ATRIAL FIBRILLATION ABLATION N/A 11/11/2011   Procedure: ATRIAL FIBRILLATION ABLATION;  Surgeon: Thompson Grayer, MD;  Location[facility] CATH LAB;  Service: Cardiovascular;  Laterality: N/A;   BREAST BIOPSY  1998   BREAST SURGERY     benign cyst removed   CARDIAC ELECTROPHYSIOLOGY STUDY AND ABLATION  5/14, 7/14   convergent AF ablation at [facility] and subsequent atypical atrial flutter ablation by Dr Lehman Prom   CARDIOVASCULAR STRESS TEST  06-06-2008   EF 73%   CARDIOVERSION  09/09/2011   Procedure: CARDIOVERSION;  Surgeon: Darlin Coco, MD;  Location: [place];   Service: Cardiovascular;  Laterality: N/A;  to be done by dr. Mare Ferrari   CARDIOVERSION  02/18/2012   Procedure: CARDIOVERSION;  Surgeon: Thayer Headings, MD;  Location: [facility] ENDOSCOPY;  Service: Cardiovascular;  Laterality: N/A;   [place]  2011   metal pin in place   FOREARM SURGERY Left 2010   "opened arm to drain it"   INSERTION OF MESH N/A 11/24/2013   Procedure: INSERTION OF MESH;  Surgeon: Michael Boston, MD;  Location: [facility] ORS;  Service: General;  Laterality: N/A;   KNEE SURGERY Left 2001   Left unicompartmental knee     Dr. Alvan Dame 11/18/16   PACEMAKER PLACEMENT  10/20/12   MDT Advisa DR implanted by Dr Lehman Prom at [place] Left 11/18/2016   Procedure: LEFT UNICOMPARTMENTAL KNEE Medially;  Surgeon: Paralee Cancel, MD;  Location: [facility] ORS;  Service: Orthopedics;  Laterality: Left;  90 mins   SKIN CANCER DESTRUCTION  summer 2015   TEE WITHOUT CARDIOVERSION  11/10/2011   Procedure: TRANSESOPHAGEAL ECHOCARDIOGRAM (TEE);  Surgeon: Thayer Headings, MD;  Location: [place];  Service: Cardiovascular;  Laterality: N/A;   TONSILLECTOMY  as child   US ECHOCARDIOGRAPHY  03-10-2008   Est EF 55-60%, Dr. Cathie Olden   VENTRAL HERNIA REPAIR N/A 11/24/2013   Procedure: LAPAROSCOPIC VENTRAL HERNIA;  Surgeon: Michael Boston, MD;  Location: [facility] ORS;  Service: General;  Laterality: N/A;   WRIST SURGERY Right ~2009   metal rod in place    Current Outpatient Medications  Medication Sig Dispense Refill   apixaban (ELIQUIS) 5 MG TABS tablet Take 1 tablet (5 mg total) by mouth 2 (two) times daily. 60 tablet 5   CALCIUM PO Take 1 tablet by mouth at bedtime.     Calcium Polycarbophil (FIBERCON PO) Take 2 tablets by mouth every morning.     carbidopa-levodopa (SINEMET CR) 50-200 MG tablet TAKE 1 TABLET BY MOUTH EVERYDAY AT BEDTIME 90 tablet 1   carbidopa-levodopa (SINEMET IR) 25-100 MG tablet Take 1.5 tablets by mouth 3 (three) times daily. 405 tablet 1    clonazePAM (KLONOPIN) 0.5 MG tablet Take 0.5 tablets (0.25 mg total) by mouth at bedtime. 45 tablet 1   cyanocobalamin (,VITAMIN B-12,) 1000 MCG/ML injection INJECT 1ML INTRAMUSCULARLY IN THE DELTOID ONCE A MONTH (ALTERNATING DELT. EACH MONTH)     cyanocobalamin 100 MCG tablet Take 100 mcg by mouth daily.     Docusate Sodium (COLACE PO) Take 1 capsule by mouth 2 (two) times daily as needed (constipation).     ezetimibe (ZETIA) 10 MG tablet Take 10 mg by mouth every other day. Taking the same day as the crestor     furosemide (LASIX) 40 MG tablet Take 1.5 tablets (60 mg total)  by mouth every morning AND 1 tablet (40 mg total) every evening. 225 tablet 3   levothyroxine (SYNTHROID) 50 MCG tablet Take 50 mcg by mouth daily. Pt take 1 1/2 tablet on Monday and Thursday and 1 tablet the rest of the week     Magnesium Oxide 500 MG TABS Take by mouth daily.     metoprolol succinate (TOPROL-XL) 25 MG 24 hr tablet TAKE 1 TABLET BY MOUTH DAILY. PLEASE MAKE OVERDUE APPT WITH DR. ALLRED BEFORE ANYMORE REFILLS. 90 tablet 3   multivitamin-lutein (OCUVITE-LUTEIN) CAPS capsule Take 1 capsule by mouth daily.     NON FORMULARY Liposomal Glutathione take 1 table qd     polyethylene glycol (MIRALAX / GLYCOLAX) packet Take 17 g by mouth 2 (two) times daily. (Patient taking differently: Take 17 g by mouth daily as needed.) 14 each 0   potassium chloride (K-DUR,KLOR-CON) 10 MEQ tablet Take 10 mEq by mouth 2 (two) times daily.      Probiotic Product (PROBIOTIC-10 PO) Take 1 tablet by mouth daily. Lafonda Mosses Brand     PROGESTERONE PO Take 25 mg by mouth at bedtime.     raloxifene (EVISTA) 60 MG tablet Take 60 mg by mouth daily.  3   rasagiline (AZILECT) 1 MG TABS tablet Take 1 tablet (1 mg total) by mouth daily. 90 tablet 1   rosuvastatin (CRESTOR) 5 MG tablet Take 2.5 mg by mouth every other day.     TWINRIX 720-20 ELU-MCG/ML injection      valACYclovir (VALTREX) 1000 MG tablet Take 1,000 mg by mouth 2 (two) times daily as  needed (fever blisters).     VITAMIN D, ERGOCALCIFEROL, PO Take 1,000 Units by mouth daily.      No current facility-administered medications for this visit.    Allergies:   Statins   Social History:  The patient  reports that she has never smoked. She has never used smokeless tobacco. She reports that she does not drink alcohol and does not use drugs.   Family History:  The patient's family history includes Alzheimer's disease in her mother; Breast cancer in her maternal aunt; Healthy in her child and child; Heart failure in her father.  ROS:  Please see the history of present illness.    All other systems are reviewed and otherwise negative.   PHYSICAL EXAM:  VS:  BP 100/64   Pulse 69   Ht 5\' 1"  (1.549 m)   Wt 138 lb (62.6 kg)   LMP 02/04/1996   SpO2 99%   BMI 26.07 kg/m  BMI: Body mass index is 26.07 kg/m. Well nourished, well developed, in no acute distress  HEENT: normocephalic, atraumatic  Neck: no JVD, carotid bruits or masses Cardiac:  RRR; no significant murmurs, no rubs, or gallops Lungs:  CTA b/l, no wheezing, rhonchi or rales  Abd: soft, nontender MS: no deformity, age appropriate/perhaps advanced atrophy Ext: no edema  Skin: warm and dry, no rash Neuro:  No gross deficits appreciated Psych: euthymic mood, full affect  PPM site is stable, no tethering or discomfort   EKG:  not done today  PPM interrogation  Battery is 56mo to ERI Lead measurements are good + Aflutter episodes in the last few days, , rate controlled, none that reach an hour duration Burden 10.9%, seems to wax/wane  11/16/2019: stress myoview  Nuclear stress EF: 71%. The left ventricular ejection fraction is hyperdynamic (>65%). The study is normal. This is a low risk study.   Fixed apical to mid anteroseptal  perfusion defect, with normal systolic function, suggests artifact.   11/16/2019: TTE IMPRESSIONS   1. Left ventricular ejection fraction, by estimation, is 55 to 60%. The   left ventricle has normal function. The left ventricle has no regional  wall motion abnormalities. Left ventricular diastolic function could not  be evaluated.   2. Right ventricular systolic function is normal. The right ventricular  size is normal. There is moderately elevated pulmonary artery systolic  pressure. The estimated right ventricular systolic pressure is 51.0 mmHg.   3. Left atrial size was moderately dilated.   4. Right atrial size was moderately dilated.   5. The mitral valve is normal in structure. Mild mitral valve  regurgitation. No evidence of mitral stenosis.   6. The aortic valve is normal in structure. Aortic valve regurgitation is  not visualized. No aortic stenosis is present.   Myoview 02/11/2018 Nuclear stress EF: 72%. The left ventricular ejection fraction is hyperdynamic (>65%). There was no ST segment deviation noted during stress. This is a low risk study. No evidence of ischemia. The study is normal.   Echocardiogram 02/11/2018 EF 60-65, normal wall motion, normal diastolic function, trivial AI, mild MR, normal RV SF, mild TR, PASP 40   CPET 04/25/2015 Conclusion: Exercise testing with gas exchange demonstrates a  moderate functional impairment when compared to matched sedentary  norms. At peak exercise, patient appears primarily circulatory  limited with hypotensive response, low-flat O2 pulse and mild  chronotropic incompetence. Pre-exercise spirometry suggests mild  restrictive lung physiology.   Echocardiogram 03/20/2015 EF 55-60, normal wall motion, trivial AI, mild TR, moderate BAE, moderate TR, PASP 49   Cardiac catheterization 05/25/2012 Coronary angiography: Coronary dominance: left Left mainstem: Short, no CAD.  Left anterior descending (LAD): No angiographic CAD.  Left circumflex (LCx): Large dominant vessel with no angiographic CAD.  Right coronary artery (RCA): Small, nondominant vessel with no angiographic CAD.   Left ventriculography:  Left ventricular systolic function is normal, LVEF is estimated at 55-60%, there is no significant mitral regurgitation   Final Conclusions:  Normal LV systolic function, no angiographic CAD.   11/11/2011: EPS/ablation PROCEDURES: 1. Comprehensive electrophysiologic study. 2. Coronary sinus pacing and recording. 3. Three-dimensional mapping of atrial fibrillation with additional mapping and ablation of discrete foci within the left atrium 4. Ablation of atrial fibrillation with additional ablation of discrete foci within the left atrium 5 Intracardiac echocardiography. 6. Transseptal puncture of an intact septum. 7. Rotational Angiography with processing at an independent workstation 8. Arrhythmia induction with pacing 9.External cardioversion.    Recent Labs: 09/28/2020: Hemoglobin 8.8; NT-Pro BNP 744; Platelets 208 10/16/2020: BUN 21; Creatinine, Ser 0.90; Magnesium 2.0; Potassium 3.6; Sodium 141  No results found for requested labs within last 8760 hours.   CrCl cannot be calculated (Patient's most recent lab result is older than the maximum 21 days allowed.).   Wt Readings from Last 3 Encounters:  12/31/20 138 lb (62.6 kg)  10/16/20 142 lb 3.2 oz (64.5 kg)  10/10/20 140 lb 12.8 oz (63.9 kg)     Other studies reviewed: Additional studies/records reviewed today include: summarized above  ASSESSMENT AND PLAN:  1. PPM     Nearing ERI     Her home monitor has given her an error code, she can not recall what the code was     She is scheduled for monthly checks and is asked that when she gets home to call the device clinic to get help trouble shooting her transmitter  2. Longstanding persistent  AFib     CHA2DS2Vasc is 5, on Eliquis, appropriately dosed     10 % burden  Acute up tick in her AFlutter the last couple days She reports increased stress of late Monitor burden  She has baseline anemia, pending colonoscopy for heme+ stool She has been asked to hold her Eliquis for  the scope but not been advised or recommended to stop it Will ask her PMD office for her last labs   3. Chronic CHF (diastolic)     No symptoms or exam findings to suggest volume OL      4. HTN     No changes today   Disposition: will see her back next month to revisit her AF burden as well as make sure her remotes are back in track   Current medicines are reviewed at length with the patient today.  The patient did not have any concerns regarding medicines.  Venetia Night, PA-C 12/31/2020 12:44 PM     [place] [address] [phone] (office)  [phone] (fax)

## 2020-12-31 ENCOUNTER — Encounter: Payer: Self-pay | Admitting: Physician Assistant

## 2020-12-31 ENCOUNTER — Other Ambulatory Visit: Payer: Self-pay

## 2020-12-31 ENCOUNTER — Ambulatory Visit (INDEPENDENT_AMBULATORY_CARE_PROVIDER_SITE_OTHER): Payer: Medicare Other | Admitting: Physician Assistant

## 2020-12-31 VITALS — BP 100/64 | HR 69 | Ht 61.0 in | Wt 138.0 lb

## 2020-12-31 DIAGNOSIS — I4891 Unspecified atrial fibrillation: Secondary | ICD-10-CM | POA: Diagnosis not present

## 2020-12-31 DIAGNOSIS — Z01810 Encounter for preprocedural cardiovascular examination: Secondary | ICD-10-CM

## 2020-12-31 DIAGNOSIS — Z01818 Encounter for other preprocedural examination: Secondary | ICD-10-CM

## 2020-12-31 DIAGNOSIS — I4811 Longstanding persistent atrial fibrillation: Secondary | ICD-10-CM

## 2020-12-31 DIAGNOSIS — I5032 Chronic diastolic (congestive) heart failure: Secondary | ICD-10-CM

## 2020-12-31 DIAGNOSIS — I5189 Other ill-defined heart diseases: Secondary | ICD-10-CM

## 2020-12-31 DIAGNOSIS — Z950 Presence of cardiac pacemaker: Secondary | ICD-10-CM

## 2020-12-31 LAB — CUP PACEART INCLINIC DEVICE CHECK
Date Time Interrogation Session: 20221128180533
Implantable Lead Implant Date: 20140917
Implantable Lead Implant Date: 20140917
Implantable Lead Location: 753859
Implantable Lead Location: 753860
Implantable Pulse Generator Implant Date: 20140917
Lead Channel Pacing Threshold Amplitude: 0.75 V
Lead Channel Pacing Threshold Amplitude: 1 V
Lead Channel Pacing Threshold Pulse Width: 0.4 ms
Lead Channel Pacing Threshold Pulse Width: 0.4 ms
Lead Channel Sensing Intrinsic Amplitude: 0.6 mV
Lead Channel Sensing Intrinsic Amplitude: 14.6 mV

## 2020-12-31 NOTE — Patient Instructions (Signed)
Medication Instructions:    Your physician recommends that you continue on your current medications as directed. Please refer to the Current Medication list given to you today.  *If you need a refill on your cardiac medications before your next appointment, please call your pharmacy*   Lab Work: [place]    If you have labs (blood work) drawn today and your tests are completely normal, you will receive your results only by: [place] (if you have MyChart) OR A paper copy in the mail If you have any lab test that is abnormal or we need to change your treatment, we will call you to review the results.   Testing/Procedures: NONE ORDERED  TODAY    Follow-Up: At [facility], you and your health needs are our priority.  As part of our continuing mission to provide you with exceptional heart care, we have created designated Provider Care Teams.  These Care Teams include your primary Cardiologist (physician) and Advanced Practice Providers (APPs -  Physician Assistants and Nurse Practitioners) who all work together to provide you with the care you need, when you need it.  We recommend signing up for the patient portal called "MyChart".  Sign up information is provided on this After Visit Summary.  MyChart is used to connect with patients for Virtual Visits (Telemedicine).  Patients are able to view lab/test results, encounter notes, upcoming appointments, etc.  Non-urgent messages can be sent to your provider as well.   To learn more about what you can do with MyChart, go to [email or web address].    Your next appointment:   1 Month   The format for your next appointment:   In Person  Provider:   You will see one of the following Advanced Practice Providers on your designated Care Team:   Tommye Standard, [place]    Other Instructions

## 2021-01-03 DIAGNOSIS — K3189 Other diseases of stomach and duodenum: Secondary | ICD-10-CM | POA: Diagnosis not present

## 2021-01-03 DIAGNOSIS — K5730 Diverticulosis of large intestine without perforation or abscess without bleeding: Secondary | ICD-10-CM | POA: Diagnosis not present

## 2021-01-03 DIAGNOSIS — K7460 Unspecified cirrhosis of liver: Secondary | ICD-10-CM | POA: Diagnosis not present

## 2021-01-03 DIAGNOSIS — Z86010 Personal history of colonic polyps: Secondary | ICD-10-CM | POA: Diagnosis not present

## 2021-01-10 ENCOUNTER — Ambulatory Visit (INDEPENDENT_AMBULATORY_CARE_PROVIDER_SITE_OTHER): Payer: Medicare Other

## 2021-01-10 DIAGNOSIS — I495 Sick sinus syndrome: Secondary | ICD-10-CM

## 2021-01-15 LAB — CUP PACEART REMOTE DEVICE CHECK
Battery Remaining Longevity: 4 mo
Battery Voltage: 2.85 V
Brady Statistic AP VP Percent: 14.6 %
Brady Statistic AP VS Percent: 66.15 %
Brady Statistic AS VP Percent: 3.04 %
Brady Statistic AS VS Percent: 16.21 %
Brady Statistic RA Percent Paced: 79.74 %
Brady Statistic RV Percent Paced: 17.17 %
Date Time Interrogation Session: 20221213154450
Implantable Lead Implant Date: 20140917
Implantable Lead Implant Date: 20140917
Implantable Lead Location: 753859
Implantable Lead Location: 753860
Implantable Pulse Generator Implant Date: 20140917
Lead Channel Impedance Value: 304 Ohm
Lead Channel Impedance Value: 342 Ohm
Lead Channel Impedance Value: 342 Ohm
Lead Channel Impedance Value: 418 Ohm
Lead Channel Pacing Threshold Amplitude: 0.625 V
Lead Channel Pacing Threshold Amplitude: 1 V
Lead Channel Pacing Threshold Pulse Width: 0.4 ms
Lead Channel Pacing Threshold Pulse Width: 0.4 ms
Lead Channel Sensing Intrinsic Amplitude: 0.375 mV
Lead Channel Sensing Intrinsic Amplitude: 0.375 mV
Lead Channel Sensing Intrinsic Amplitude: 12 mV
Lead Channel Sensing Intrinsic Amplitude: 12 mV
Lead Channel Setting Pacing Amplitude: 2 V
Lead Channel Setting Pacing Amplitude: 2.5 V
Lead Channel Setting Pacing Pulse Width: 0.4 ms
Lead Channel Setting Sensing Sensitivity: 0.9 mV

## 2021-01-17 DIAGNOSIS — Z23 Encounter for immunization: Secondary | ICD-10-CM | POA: Diagnosis not present

## 2021-01-18 NOTE — Addendum Note (Signed)
Addended by: Douglass Rivers D on: 01/18/2021 03:32 PM   Modules accepted: Level of Service

## 2021-01-18 NOTE — Progress Notes (Signed)
Remote pacemaker transmission.   

## 2021-01-23 DIAGNOSIS — K7460 Unspecified cirrhosis of liver: Secondary | ICD-10-CM | POA: Diagnosis not present

## 2021-01-23 DIAGNOSIS — K5904 Chronic idiopathic constipation: Secondary | ICD-10-CM | POA: Diagnosis not present

## 2021-01-24 DIAGNOSIS — D649 Anemia, unspecified: Secondary | ICD-10-CM | POA: Diagnosis not present

## 2021-01-24 DIAGNOSIS — I480 Paroxysmal atrial fibrillation: Secondary | ICD-10-CM | POA: Diagnosis not present

## 2021-01-24 DIAGNOSIS — R7301 Impaired fasting glucose: Secondary | ICD-10-CM | POA: Diagnosis not present

## 2021-01-24 DIAGNOSIS — K5900 Constipation, unspecified: Secondary | ICD-10-CM | POA: Diagnosis not present

## 2021-01-24 DIAGNOSIS — I10 Essential (primary) hypertension: Secondary | ICD-10-CM | POA: Diagnosis not present

## 2021-01-24 DIAGNOSIS — E785 Hyperlipidemia, unspecified: Secondary | ICD-10-CM | POA: Diagnosis not present

## 2021-01-24 DIAGNOSIS — Z950 Presence of cardiac pacemaker: Secondary | ICD-10-CM | POA: Diagnosis not present

## 2021-01-24 DIAGNOSIS — E039 Hypothyroidism, unspecified: Secondary | ICD-10-CM | POA: Diagnosis not present

## 2021-01-24 DIAGNOSIS — R945 Abnormal results of liver function studies: Secondary | ICD-10-CM | POA: Diagnosis not present

## 2021-01-24 DIAGNOSIS — M810 Age-related osteoporosis without current pathological fracture: Secondary | ICD-10-CM | POA: Diagnosis not present

## 2021-02-09 NOTE — Progress Notes (Signed)
Cardiology Office Note Date:  02/11/2021  Patient ID:  [name] [name], [name] [name] [birth year], MRN [id number] PCP:  Crist Infante, MD  Electrophysiologist:  Dr. Ky Barban    Chief Complaint:   planned f/u  History of Present Illness: [name] [name] is a 81 y.o. female with history of HTN, HLD, Parkinson's disease  (Dr. 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 Osker Mason), chronic CHF (diastolic)  I saw her 7/32/20 She is doing well.  She is not a steady on her feet with the Parkinon's, though no stumbles, falls.  She denies any CP, occassionally has an awareness of her heart beat, but not fast, irregular.  No rest SOB, she sleeps weill with no symptoms or PND or orthopnea.  She has some baseline DOE that is unchanged. She was having PAFib by her EKGs/remotes pacer programmed MVP  She saw Dr. Rayann Heman in f/u Sept 2021 feeling well though mentioned some SOB, AF burden only 4%, was planned for stress and echo and if low risk could be cleared for EGD she was pending  Oct 2021: TTE with LVEF 55-60%, RVSP 52.66mmHg, stress as low risk, no ischemia/infarct  More recently she saw A. Tillery PA-C Sept 2022, SOB was improved though not completely resolved with adjustment in her lasix, no changes were made, she was cleared for colonoscopy  Pre-op pool and [facility] have commented and cleared   I saw her 12/31/20 She is doing "OK I guess", she is getting the colonoscopy 2/2 heme + stools, denies obvious bleeding or bleeding otherwise. She confirms that she has been said to be anemic for quite a long time, that is not new for her. She denies any CP, palpitations or cardiac awareness. She does say though that while generally for years she does not have much energy in the last month of so she will have an hour here and there that she feels somewhat suddenly really tired.  No t weak, not lightheaded, just tired  and then for no clear reason feels better/back to her baseline. No clear trigger, rhyme/reason to it. No fever, illness No changes to meds PMD does labs  AF burden was up, though she also reported increased personal stress, was being evaluated for anemia, pending GI/scope Home transmitter not working, she was nearing ERI. Planned for her to f/u with device clinic once home to trouble shoot her transmitter and have early follow up to monitor AF burden and ensure home monitor function with nearing ERI  + remotes, AF burden last <0.1% SOB, fatigue, ? anemia  TODAY She is doing well NO bleeding or signs of bleeding GI w/u was good, no bleeding found, pending further d/w her PMD. She feels dehydrated, mouth is dry and would like to reduce her lasix dose. She feels like generally she is feeling OK No CP or palpitations Denies any ongoing SOB, and probably a bit better with energy, but doesn't tend to sleep well.  No near syncope or syncope  Eyes are itchy and red a couple days, no pain, no drainage, thought was allergy but plans to see her PMD or eye doctor     Device information MDT dual chamber PPM implanted 10/20/2012  AFib Hx Diagnosed 2006 PVI ablation 11/11/2011, Dr. Rayann Heman AAD HX Flecainide> amiodarone >> rate control strategy   Past Medical History:  Diagnosis Date   Anemia    Arthritis    thumbs   Atrial tachycardia ([place])  Atypical atrial flutter (H[facility])    Cataract    bilateral   Chronic anticoagulation    on Xarelto   Chronic diastolic CHF (congestive heart failure) (HCC)    Echocardiogram (11/24/12): Mild LVH, EF 60%.   Dysrhythmia    GERD (gastroesophageal reflux disease)    hx of no meds   Heart murmur    slight   Hyperlipidemia    a. Hx of leg weakness while on statin. under control   Hypertension    Hypothyroidism    Leg fracture Dec 21,2011   Osteoporosis 07/2007   Parkinson's disease ([place]) 2011   Persistent atrial fibrillation ([place])    s/p  atrial fib ablation 11-11-11.    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[place])    Squamous carcinoma summer 2015   back of left thigh   Wrist fracture    right    Past Surgical History:  Procedure Laterality Date   ATRIAL FIBRILLATION ABLATION N/A 11/11/2011   Procedure: ATRIAL FIBRILLATION ABLATION;  Surgeon: Thompson Grayer, MD;  Location: [facility] CATH LAB;  Service: Cardiovascular;  Laterality: N/A;   BREAST BIOPSY  1998   BREAST SURGERY     benign cyst removed   CARDIAC ELECTROPHYSIOLOGY STUDY AND ABLATION  5/14, 7/14   convergent AF ablation at [facility] and subsequent atypical atrial flutter ablation by Dr Lehman Prom   CARDIOVASCULAR STRESS TEST  06-06-2008   EF 73%   CARDIOVERSION  09/09/2011   Procedure: CARDIOVERSION;  Surgeon: Darlin Coco, MD;  Location: [place];  Service: Cardiovascular;  Laterality: N/A;  to be done by dr. Mare Ferrari   CARDIOVERSION  02/18/2012   Procedure: CARDIOVERSION;  Surgeon: Thayer Headings, MD;  Location: [facility] ENDOSCOPY;  Service: Cardiovascular;  Laterality: N/A;   [place]  2011   metal pin in place   FOREARM SURGERY Left 2010   "opened arm to drain it"   INSERTION OF MESH N/A 11/24/2013   Procedure: INSERTION OF MESH;  Surgeon: Michael Boston, MD;  Location: [facility] ORS;  Service: General;  Laterality: N/A;   KNEE SURGERY Left 2001   Left unicompartmental knee     Dr. Alvan Dame 11/18/16   PACEMAKER PLACEMENT  10/20/12   MDT Advisa DR implanted by Dr Lehman Prom at [place] Left 11/18/2016   Procedure: LEFT UNICOMPARTMENTAL KNEE Medially;  Surgeon: Paralee Cancel, MD;  Location: [facility] ORS;  Service: Orthopedics;  Laterality: Left;  90 mins   SKIN CANCER DESTRUCTION  summer 2015   TEE WITHOUT CARDIOVERSION  11/10/2011   Procedure: TRANSESOPHAGEAL  ECHOCARDIOGRAM (TEE);  Surgeon: Thayer Headings, MD;  Location: [place];  Service: Cardiovascular;  Laterality: N/A;   TONSILLECTOMY  as child   US ECHOCARDIOGRAPHY  03-10-2008   Est EF 55-60%, Dr. Cathie Olden   VENTRAL HERNIA REPAIR N/A 11/24/2013   Procedure: LAPAROSCOPIC VENTRAL HERNIA;  Surgeon: Michael Boston, MD;  Location: [facility];  Service: General;  Laterality: N/A;   WRIST SURGERY Right ~2009   metal rod in place    Current Outpatient Medications  Medication Sig Dispense Refill   CALCIUM PO Take 1 tablet by mouth at bedtime.     Calcium Polycarbophil (FIBERCON PO) Take 2 tablets by mouth every morning.     carbidopa-levodopa (SINEMET CR) 50-200 MG  tablet TAKE 1 TABLET BY MOUTH EVERYDAY AT BEDTIME 90 tablet 1   carbidopa-levodopa (SINEMET IR) 25-100 MG tablet Take 1.5 tablets by mouth 3 (three) times daily. 405 tablet 1   cyanocobalamin (,VITAMIN B-12,) 1000 MCG/ML injection INJECT 1ML INTRAMUSCULARLY IN THE DELTOID ONCE A MONTH (ALTERNATING DELT. EACH MONTH)     cyanocobalamin 100 MCG tablet Take 100 mcg by mouth daily.     Docusate Sodium (COLACE PO) Take 1 capsule by mouth 2 (two) times daily as needed (constipation).     docusate sodium (COLACE) 100 MG capsule Take 100 mg by mouth 2 (two) times daily.     ELIQUIS 5 MG TABS tablet TAKE 1 TABLET BY MOUTH TWICE A DAY 60 tablet 5   ezetimibe (ZETIA) 10 MG tablet Take 10 mg by mouth every other day. Taking the same day as the crestor     levothyroxine (SYNTHROID) 50 MCG tablet Take 50 mcg by mouth daily. Pt take 1 1/2 tablet on Monday and Thursday and 1 tablet the rest of the week     Magnesium Oxide 500 MG TABS Take 500 mg by mouth daily.     multivitamin-lutein (OCUVITE-LUTEIN) CAPS capsule Take 1 capsule by mouth daily.     NON FORMULARY Liposomal Glutathione take 1 table qd     Probiotic Product (PROBIOTIC-10 PO) Take 1 tablet by mouth daily. Lafonda Mosses Brand     PROGESTERONE PO Take 25 mg by mouth at bedtime.     raloxifene (EVISTA) 60  MG tablet Take 60 mg by mouth daily.  3   rasagiline (AZILECT) 1 MG TABS tablet Take 1 tablet (1 mg total) by mouth daily. 90 tablet 1   rosuvastatin (CRESTOR) 5 MG tablet Take 2.5 mg by mouth every other day.     TWINRIX 720-20 ELU-MCG/ML injection Inject 1 mL into the muscle once.     valACYclovir (VALTREX) 1000 MG tablet Take 1,000 mg by mouth 2 (two) times daily as needed (fever blisters).     VITAMIN D, ERGOCALCIFEROL, PO Take 1,000 Units by mouth daily.      furosemide (LASIX) 40 MG tablet Take 1.5 tablets (60 mg total) by mouth daily. 135 tablet 1   metoprolol succinate (TOPROL-XL) 25 MG 24 hr tablet TAKE 1 TABLET BY MOUTH DAILY. 90 tablet 1   potassium chloride (KLOR-CON M) 10 MEQ tablet Take 1 tablet (10 mEq total) by mouth daily. 90 tablet 1   No current facility-administered medications for this visit.    Allergies:   Statins   Social History:  The patient  reports that she has never smoked. She has never used smokeless tobacco. She reports that she does not drink alcohol and does not use drugs.   Family History:  The patient's family history includes Alzheimer's disease in her mother; Breast cancer in her maternal aunt; Healthy in her child and child; Heart failure in her father.  ROS:  Please see the history of present illness.    All other systems are reviewed and otherwise negative.   PHYSICAL EXAM:  VS:  BP (!) 100/52    Pulse 90    Ht 5\' 2"  (1.575 m)    Wt 138 lb 9.6 oz (62.9 kg)    LMP 02/04/1996    SpO2 96%    BMI 25.35 kg/m  BMI: Body mass index is 25.35 kg/m. Well nourished, well developed, in no acute distress  HEENT: normocephalic, atraumatic  Neck: no JVD, carotid bruits or masses Cardiac:  RRR; no significant  murmurs, no rubs, or gallops Lungs:  CTA b/l, no wheezing, rhonchi or rales  Abd: soft, nontender MS: no deformity, age appropriate/perhaps advanced atrophy Ext: no edema  Skin: warm and dry, no rash Neuro:  Parkinson-like movements, other-wise no  gross motor deficits appreciated Psych: euthymic mood, full affect   PPM site is stable, no tethering or discomfort   EKG:  not done today  PPM interrogation  Battery estimate to ERI is 4 mo AFib burden has slowed, 0.3% Lead measurements are good  11/16/2019: stress myoview  Nuclear stress EF: 71%. The left ventricular ejection fraction is hyperdynamic (>65%). The study is normal. This is a low risk study.   Fixed apical to mid anteroseptal perfusion defect, with normal systolic function, suggests artifact.   11/16/2019: TTE IMPRESSIONS   1. Left ventricular ejection fraction, by estimation, is 55 to 60%. The  left ventricle has normal function. The left ventricle has no regional  wall motion abnormalities. Left ventricular diastolic function could not  be evaluated.   2. Right ventricular systolic function is normal. The right ventricular  size is normal. There is moderately elevated pulmonary artery systolic  pressure. The estimated right ventricular systolic pressure is 93.9 mmHg.   3. Left atrial size was moderately dilated.   4. Right atrial size was moderately dilated.   5. The mitral valve is normal in structure. Mild mitral valve  regurgitation. No evidence of mitral stenosis.   6. The aortic valve is normal in structure. Aortic valve regurgitation is  not visualized. No aortic stenosis is present.   Myoview 02/11/2018 Nuclear stress EF: 72%. The left ventricular ejection fraction is hyperdynamic (>65%). There was no ST segment deviation noted during stress. This is a low risk study. No evidence of ischemia. The study is normal.   Echocardiogram 02/11/2018 EF 60-65, normal wall motion, normal diastolic function, trivial AI, mild MR, normal RV SF, mild TR, PASP 40   CPET 04/25/2015 Conclusion: Exercise testing with gas exchange demonstrates a  moderate functional impairment when compared to matched sedentary  norms. At peak exercise, patient appears primarily  circulatory  limited with hypotensive response, low-flat O2 pulse and mild  chronotropic incompetence. Pre-exercise spirometry suggests mild  restrictive lung physiology.   Echocardiogram 03/20/2015 EF 55-60, normal wall motion, trivial AI, mild TR, moderate BAE, moderate TR, PASP 49   Cardiac catheterization 05/25/2012 Coronary angiography: Coronary dominance: left Left mainstem: Short, no CAD.  Left anterior descending (LAD): No angiographic CAD.  Left circumflex (LCx): Large dominant vessel with no angiographic CAD.  Right coronary artery (RCA): Small, nondominant vessel with no angiographic CAD.   Left ventriculography: Left ventricular systolic function is normal, LVEF is estimated at 55-60%, there is no significant mitral regurgitation   Final Conclusions:  Normal LV systolic function, no angiographic CAD.   11/11/2011: EPS/ablation PROCEDURES: 1. Comprehensive electrophysiologic study. 2. Coronary sinus pacing and recording. 3. Three-dimensional mapping of atrial fibrillation with additional mapping and ablation of discrete foci within the left atrium 4. Ablation of atrial fibrillation with additional ablation of discrete foci within the left atrium 5 Intracardiac echocardiography. 6. Transseptal puncture of an intact septum. 7. Rotational Angiography with processing at an independent workstation 8. Arrhythmia induction with pacing 9.External cardioversion.    Recent Labs: 09/28/2020: Hemoglobin 8.8; NT-Pro BNP 744; Platelets 208 10/16/2020: BUN 21; Creatinine, Ser 0.90; Magnesium 2.0; Potassium 3.6; Sodium 141  No results found for requested labs within last 8760 hours.   CrCl cannot be calculated (Patient's most recent lab  result is older than the maximum 21 days allowed.).   Wt Readings from Last 3 Encounters:  02/11/21 138 lb 9.6 oz (62.9 kg)  12/31/20 138 lb (62.6 kg)  10/16/20 142 lb 3.2 oz (64.5 kg)     Other studies reviewed: Additional studies/records reviewed  today include: summarized above  ASSESSMENT AND PLAN:  1. PPM     Nearing ERI     + remotes back on line     Will have her back in 86mo, though today we discussed gen change procedure, potential risks and benefits, should she reach ERI sooner, she is comfortable scheduling the procedure without another visit  2. Longstanding persistent AFib     CHA2DS2Vasc is 5, on Eliquis, appropriately dosed     0.3 % burden    3. Chronic CHF (diastolic)     No symptoms or exam findings to suggest volume OL     Will reduce her lasix to 60mg  AM only and her potassium to 24meq daily.     Discussed if SOB, water retention, swelling occurs to resume prior dosing for both     BMET 2 weeks      4. HTN     A little low, reduce lasix as discussed     No symptoms   Disposition: as above, monthly remotes   Current medicines are reviewed at length with the patient today.  The patient did not have any concerns regarding medicines.  Venetia Night, PA-C 02/11/2021 10:36 AM     [facility] [place] [address] [phone] (office)  [phone] (fax)

## 2021-02-10 ENCOUNTER — Other Ambulatory Visit: Payer: Self-pay | Admitting: Internal Medicine

## 2021-02-10 DIAGNOSIS — I4811 Longstanding persistent atrial fibrillation: Secondary | ICD-10-CM

## 2021-02-11 ENCOUNTER — Other Ambulatory Visit: Payer: Self-pay

## 2021-02-11 ENCOUNTER — Ambulatory Visit (INDEPENDENT_AMBULATORY_CARE_PROVIDER_SITE_OTHER): Payer: Medicare Other

## 2021-02-11 ENCOUNTER — Encounter: Payer: Self-pay | Admitting: Physician Assistant

## 2021-02-11 ENCOUNTER — Ambulatory Visit (INDEPENDENT_AMBULATORY_CARE_PROVIDER_SITE_OTHER): Payer: Medicare Other | Admitting: Physician Assistant

## 2021-02-11 VITALS — BP 100/52 | HR 90 | Ht 62.0 in | Wt 138.6 lb

## 2021-02-11 DIAGNOSIS — I5032 Chronic diastolic (congestive) heart failure: Secondary | ICD-10-CM

## 2021-02-11 DIAGNOSIS — I4811 Longstanding persistent atrial fibrillation: Secondary | ICD-10-CM

## 2021-02-11 DIAGNOSIS — I484 Atypical atrial flutter: Secondary | ICD-10-CM | POA: Diagnosis not present

## 2021-02-11 DIAGNOSIS — I495 Sick sinus syndrome: Secondary | ICD-10-CM

## 2021-02-11 DIAGNOSIS — Z950 Presence of cardiac pacemaker: Secondary | ICD-10-CM

## 2021-02-11 DIAGNOSIS — I10 Essential (primary) hypertension: Secondary | ICD-10-CM

## 2021-02-11 MED ORDER — POTASSIUM CHLORIDE CRYS ER 10 MEQ PO TBCR: 10.0000 meq | EXTENDED_RELEASE_TABLET | Freq: Every day | ORAL | 1 refills | Status: DC

## 2021-02-11 MED ORDER — METOPROLOL SUCCINATE ER 25 MG PO TB24: ORAL_TABLET | ORAL | 1 refills | Status: DC

## 2021-02-11 MED ORDER — FUROSEMIDE 40 MG PO TABS
60.0000 mg | ORAL_TABLET | Freq: Every day | ORAL | 1 refills | Status: DC
Start: 2021-02-11 — End: 2021-03-01

## 2021-02-11 NOTE — Telephone Encounter (Addendum)
Eliquis 5mg  refill request received. Patient is 81 years old, weight-62.6kg, Crea-0.90 on 10/16/2020, Diagnosis-Afib, and last seen by Tommye Standard on 12/31/2020 & pending appt today. Dose is appropriate based on dosing criteria. Will send in refill to requested pharmacy.

## 2021-02-11 NOTE — Patient Instructions (Addendum)
Medication Instructions:    START TAKING: FUROSEMIDE ( LASIX) 60 MG  ONCE A DAY   ( TABLET AND HALF OF 40 MG  TABLETS  )    START TAKING: POTASSIUM 10 MEQ ONCE A DAY   *If you need a refill on your cardiac medications before your next appointment, please call your pharmacy*   Lab Work:  RETURN  FOR BMET IN 2 WEEKS  If you have labs (blood work) drawn today and your tests are completely normal, you will receive your results only by: [place] (if you have MyChart) OR A paper copy in the mail If you have any lab test that is abnormal or we need to change your treatment, we will call you to review the results.   Testing/Procedures: NONE ORDERED  TODAY    Follow-Up: At [facility], you and your health needs are our priority.  As part of our continuing mission to provide you with exceptional heart care, we have created designated Provider Care Teams.  These Care Teams include your primary Cardiologist (physician) and Advanced Practice Providers (APPs -  Physician Assistants and Nurse Practitioners) who all work together to provide you with the care you need, when you need it.  We recommend signing up for the patient portal called "MyChart".  Sign up information is provided on this After Visit Summary.  MyChart is used to connect with patients for Virtual Visits (Telemedicine).  Patients are able to view lab/test results, encounter notes, upcoming appointments, etc.  Non-urgent messages can be sent to your provider as well.   To learn more about what you can do with MyChart, go to [email or web address].    Your next appointment:   4 month(s) ( CONTACT [facility] FOR EP SCHEDULING ISSUES )   The format for your next appointment:   In Person  Provider:   You will see one of the following Advanced Practice Providers on your designated Care Team:   Tommye Standard, [place] Legrand Como "Jonni Sanger" Chalmers Cater, [place]   Other Instructions

## 2021-02-12 LAB — CUP PACEART REMOTE DEVICE CHECK
Battery Remaining Longevity: 4 mo
Battery Voltage: 2.85 V
Brady Statistic AP VP Percent: 10.03 %
Brady Statistic AP VS Percent: 72.55 %
Brady Statistic AS VP Percent: 1.95 %
Brady Statistic AS VS Percent: 15.47 %
Brady Statistic RA Percent Paced: 80.48 %
Brady Statistic RV Percent Paced: 11.08 %
Date Time Interrogation Session: 20230110111439
Implantable Lead Implant Date: 20140917
Implantable Lead Implant Date: 20140917
Implantable Lead Location: 753859
Implantable Lead Location: 753860
Implantable Pulse Generator Implant Date: 20140917
Lead Channel Impedance Value: 285 Ohm
Lead Channel Impedance Value: 323 Ohm
Lead Channel Impedance Value: 323 Ohm
Lead Channel Impedance Value: 418 Ohm
Lead Channel Pacing Threshold Amplitude: 0.75 V
Lead Channel Pacing Threshold Amplitude: 0.875 V
Lead Channel Pacing Threshold Pulse Width: 0.4 ms
Lead Channel Pacing Threshold Pulse Width: 0.4 ms
Lead Channel Sensing Intrinsic Amplitude: 0.25 mV
Lead Channel Sensing Intrinsic Amplitude: 0.25 mV
Lead Channel Sensing Intrinsic Amplitude: 12 mV
Lead Channel Sensing Intrinsic Amplitude: 12 mV
Lead Channel Setting Pacing Amplitude: 2 V
Lead Channel Setting Pacing Amplitude: 2.5 V
Lead Channel Setting Pacing Pulse Width: 0.4 ms
Lead Channel Setting Sensing Sensitivity: 0.9 mV

## 2021-02-14 ENCOUNTER — Other Ambulatory Visit: Payer: Self-pay

## 2021-02-14 ENCOUNTER — Encounter (HOSPITAL_BASED_OUTPATIENT_CLINIC_OR_DEPARTMENT_OTHER): Payer: Self-pay | Admitting: *Deleted

## 2021-02-14 ENCOUNTER — Emergency Department (HOSPITAL_BASED_OUTPATIENT_CLINIC_OR_DEPARTMENT_OTHER)
Admission: EM | Admit: 2021-02-14 | Discharge: 2021-02-14 | Disposition: A | Discharge status: Home / Self Care | Payer: Medicare Other | Attending: Emergency Medicine | Admitting: Emergency Medicine

## 2021-02-14 ENCOUNTER — Emergency Department (HOSPITAL_BASED_OUTPATIENT_CLINIC_OR_DEPARTMENT_OTHER): Payer: Medicare Other

## 2021-02-14 DIAGNOSIS — R42 Dizziness and giddiness: Secondary | ICD-10-CM | POA: Diagnosis not present

## 2021-02-14 DIAGNOSIS — Z79899 Other long term (current) drug therapy: Secondary | ICD-10-CM | POA: Diagnosis not present

## 2021-02-14 DIAGNOSIS — E876 Hypokalemia: Secondary | ICD-10-CM | POA: Insufficient documentation

## 2021-02-14 DIAGNOSIS — Z7901 Long term (current) use of anticoagulants: Secondary | ICD-10-CM | POA: Insufficient documentation

## 2021-02-14 DIAGNOSIS — I10 Essential (primary) hypertension: Secondary | ICD-10-CM | POA: Insufficient documentation

## 2021-02-14 DIAGNOSIS — G20 Parkinson's disease: Secondary | ICD-10-CM | POA: Insufficient documentation

## 2021-02-14 DIAGNOSIS — R9431 Abnormal electrocardiogram [ECG] [EKG]: Secondary | ICD-10-CM | POA: Diagnosis not present

## 2021-02-14 DIAGNOSIS — R682 Dry mouth, unspecified: Secondary | ICD-10-CM | POA: Diagnosis not present

## 2021-02-14 LAB — COMPREHENSIVE METABOLIC PANEL
ALT: <5 U/L (ref 0–44)
AST: 15 U/L (ref 15–41)
Albumin: 3.9 g/dL (ref 3.5–5.0)
Alkaline Phosphatase: 60 U/L (ref 38–126)
Anion gap: 9 (ref 5–15)
BUN: 16 mg/dL (ref 8–23)
CO2: 32 mmol/L (ref 22–32)
Calcium: 9.2 mg/dL (ref 8.9–10.3)
Chloride: 98 mmol/L (ref 98–111)
Creatinine, Ser: 0.76 mg/dL (ref 0.44–1.00)
GFR, Estimated: >60 mL/min (ref >60)
Glucose, Bld: 98 mg/dL (ref 70–99)
Potassium: 2.9 mmol/L — ABNORMAL LOW (ref 3.5–5.1)
Sodium: 139 mmol/L (ref 135–145)
Total Bilirubin: 1.2 mg/dL (ref 0.3–1.2)
Total Protein: 6.9 g/dL (ref 6.5–8.1)

## 2021-02-14 LAB — CBC WITH DIFFERENTIAL/PLATELET
Abs Immature Granulocytes: 0.03 10*3/uL (ref 0.00–0.07)
Basophils Absolute: 0 10*3/uL (ref 0.0–0.1)
Basophils Relative: 1 %
Eosinophils Absolute: 0.1 10*3/uL (ref 0.0–0.5)
Eosinophils Relative: 2 %
HCT: 29.8 % — ABNORMAL LOW (ref 36.0–46.0)
Hemoglobin: 9.5 g/dL — ABNORMAL LOW (ref 12.0–15.0)
Immature Granulocytes: 1 %
Lymphocytes Relative: 15 %
Lymphs Abs: 0.9 10*3/uL (ref 0.7–4.0)
MCH: 28.4 pg (ref 26.0–34.0)
MCHC: 31.9 g/dL (ref 30.0–36.0)
MCV: 89.2 fL (ref 80.0–100.0)
Monocytes Absolute: 0.7 10*3/uL (ref 0.1–1.0)
Monocytes Relative: 11 %
Neutro Abs: 4.4 10*3/uL (ref 1.7–7.7)
Neutrophils Relative %: 70 %
Platelets: 190 10*3/uL (ref 150–400)
RBC: 3.34 MIL/uL — ABNORMAL LOW (ref 3.87–5.11)
RDW: 15.8 % — ABNORMAL HIGH (ref 11.5–15.5)
WBC: 6.2 10*3/uL (ref 4.0–10.5)
nRBC: 0 % (ref 0.0–0.2)

## 2021-02-14 LAB — CBG MONITORING, ED: Glucose-Capillary: 105 mg/dL — ABNORMAL HIGH (ref 70–99)

## 2021-02-14 LAB — AMMONIA: Ammonia: 54 umol/L — ABNORMAL HIGH (ref 9–35)

## 2021-02-14 LAB — TROPONIN I (HIGH SENSITIVITY)
Troponin I (High Sensitivity): 8 ng/L (ref <18)
Troponin I (High Sensitivity): 8 ng/L (ref <18)

## 2021-02-14 MED ORDER — SODIUM CHLORIDE 0.9 % IV BOLUS
1000.0000 mL | Freq: Once | INTRAVENOUS | Status: AC
  Administered 2021-02-14: 1000 mL via INTRAVENOUS

## 2021-02-14 MED ORDER — POTASSIUM CHLORIDE CRYS ER 20 MEQ PO TBCR
40.0000 meq | EXTENDED_RELEASE_TABLET | Freq: Once | ORAL | Status: AC
  Administered 2021-02-14: 40 meq via ORAL
  Filled 2021-02-14: qty 2

## 2021-02-14 NOTE — Discharge Instructions (Signed)
You have been seen and discharged from the emergency department.  Your potassium was low at this visit.  This is most likely from laxative use/diarrhea.  You were given an extra dose of potassium here in the department, stay well-hydrated and continue to take your daily supplement until repeat blood work can confirm that your potassium has stabilized.  Head CT was unremarkable.  Follow-up with your primary provider for further evaluation and further care. Take home medications as prescribed. If you have any worsening symptoms or further concerns for your health please return to an emergency department for further evaluation.

## 2021-02-14 NOTE — ED Provider Notes (Signed)
Patient signed out to me by previous provider. Please refer to their note for full HPI.  Briefly this is a 81 year old female who presented to the previous physician with concern for "not feeling right".  She had very nonspecific complaints, reassuring physical exam.  Plan for metabolic work-up, head CT and reevaluation. Physical Exam  BP (!) 152/80    Pulse 72    Temp 98.2 F (36.8 C) (Oral)    Resp 17    Ht 5\' 2"  (1.575 m)    Wt 62.6 kg    LMP 02/04/1996    SpO2 98%    BMI 25.24 kg/m   Physical Exam Vitals and nursing note reviewed.  Constitutional:      Appearance: Normal appearance.  HENT:     Head: Normocephalic.     Mouth/Throat:     Mouth: Mucous membranes are moist.  Cardiovascular:     Rate and Rhythm: Normal rate.  Pulmonary:     Effort: Pulmonary effort is normal. No respiratory distress.  Abdominal:     Palpations: Abdomen is soft.     Tenderness: There is no abdominal tenderness.  Skin:    General: Skin is warm.  Neurological:     Mental Status: She is alert and oriented to person, place, and time. Mental status is at baseline.  Psychiatric:        Mood and Affect: Mood normal.    Procedures  Procedures  ED Course / MDM    Medical Decision Making  Blood work shows a hypokalemia.  Patient has been taking laxatives with increasing stool which she describes as "too much".  This may be a source of loss for the potassium.  Blood work is otherwise normal besides a mildly elevated ammonia which I do not feel is significant or contributing to her current presentation.  She is currently baseline mental status.  Plan for oral repletion of potassium.  She has been instructed to continue oral repletion of potassium at home with repeat blood work next week and close monitoring.  After IV fluids she feels significantly better.  Head CT is negative.  She feels well and is requesting to be discharged home.  Family at bedside agrees.  Patient at this time appears safe and stable for  discharge and close outpatient follow up. Discharge plan and strict return to ED precautions discussed, patient verbalizes understanding and agreement.       Lorelle Gibbs, DO 02/14/21 1051

## 2021-02-14 NOTE — ED Notes (Signed)
Pt transported to CT ?

## 2021-02-14 NOTE — ED Triage Notes (Signed)
Pt c/o "not feeling right"; pt states she went to bed at 10pm and woke up around 5am; pt states her mouth feels very dry; pt denies any pain or weakness

## 2021-02-14 NOTE — ED Provider Notes (Signed)
[facility] Provider Note   CSN: [id number] Arrival date & time: 02/14/21  8338     History  Chief Complaint  Patient presents with   Dizziness    [name] [name] is a 81 y.o. female.  Patient is a 81 year old female with past medical history of Parkinson's disease, atrial fibrillation on Eliquis, hypertension, GERD.  Patient presenting today for evaluation of dizziness and feeling lightheaded.  This began at approximately 5 AM after going to bed feeling well at approximately 10 PM.  She denies she is having any headache, or numbness/weakness of the extremities.  She denies any fevers or chills.  She denies any chest pain, palpitations.  She denies a room spinning sensation.  She does report taking extra laxative yesterday for constipation and also began taking a supplement for sleep called Metacalm.  This was her first time taking the Metacalm.  The history is provided by the patient.  Dizziness Quality:  Lightheadedness Severity:  Moderate Onset quality:  Sudden Duration:  2 hours Timing:  Constant Progression:  Unchanged Chronicity:  New     Home Medications Prior to Admission medications   Medication Sig Start Date End Date Taking? Authorizing Provider  CALCIUM PO Take 1 tablet by mouth at bedtime.    [provider]  Calcium Polycarbophil (FIBERCON PO) Take 2 tablets by mouth every morning.    [provider]  carbidopa-levodopa (SINEMET CR) 50-200 MG tablet TAKE 1 TABLET BY MOUTH EVERYDAY AT BEDTIME 10/10/20   Tat, Rebecca S, DO  carbidopa-levodopa (SINEMET IR) 25-100 MG tablet Take 1.5 tablets by mouth 3 (three) times daily. 10/10/20   Tat, Eustace Quail, DO  cyanocobalamin (,VITAMIN B-12,) 1000 MCG/ML injection INJECT 1ML INTRAMUSCULARLY IN THE DELTOID ONCE A MONTH (ALTERNATING DELT. EACH MONTH) 09/24/18   [provider]  cyanocobalamin 100 MCG tablet Take 100 mcg by mouth daily.    [provider]   Docusate Sodium (COLACE PO) Take 1 capsule by mouth 2 (two) times daily as needed (constipation).    [provider]  docusate sodium (COLACE) 100 MG capsule Take 100 mg by mouth 2 (two) times daily.    [provider]  ELIQUIS 5 MG TABS tablet TAKE 1 TABLET BY MOUTH TWICE A DAY 02/11/21   Baldwin Jamaica, PA-C  ezetimibe (ZETIA) 10 MG tablet Take 10 mg by mouth every other day. Taking the same day as the crestor    [provider]  furosemide (LASIX) 40 MG tablet Take 1.5 tablets (60 mg total) by mouth daily. 02/11/21   Baldwin Jamaica, PA-C  levothyroxine (SYNTHROID) 50 MCG tablet Take 50 mcg by mouth daily. Pt take 1 1/2 tablet on Monday and Thursday and 1 tablet the rest of the week 05/07/19   [provider]  Magnesium Oxide 500 MG TABS Take 500 mg by mouth daily.    [provider]  metoprolol succinate (TOPROL-XL) 25 MG 24 hr tablet TAKE 1 TABLET BY MOUTH DAILY. 02/11/21   Baldwin Jamaica, PA-C  multivitamin-lutein [facility]) CAPS capsule Take 1 capsule by mouth daily.    [provider]  NON FORMULARY Liposomal Glutathione take 1 table qd    [provider]  potassium chloride (KLOR-CON M) 10 MEQ tablet Take 1 tablet (10 mEq total) by mouth daily. 02/11/21   Baldwin Jamaica, PA-C  Probiotic Product (PROBIOTIC-10 PO) Take 1 tablet by mouth daily. Lexine Baton    [provider]  PROGESTERONE PO Take 25  mg by mouth at bedtime. 03/05/20   [provider]  raloxifene (EVISTA) 60 MG tablet Take 60 mg by mouth daily. 08/14/15   [provider]  rasagiline (AZILECT) 1 MG TABS tablet Take 1 tablet (1 mg total) by mouth daily. 10/10/20   Tat, Eustace Quail, DO  rosuvastatin (CRESTOR) 5 MG tablet Take 2.5 mg by mouth every other day.    [provider]  Garnet Koyanagi 720-20 ELU-MCG/ML injection Inject 1 mL into the muscle once. 10/13/19   [provider]  valACYclovir (VALTREX) 1000 MG tablet Take 1,000 mg  by mouth 2 (two) times daily as needed (fever blisters).    [provider]  VITAMIN D, ERGOCALCIFEROL, PO Take 1,000 Units by mouth daily.     [provider]      Allergies    Statins    Review of Systems   Review of Systems  Neurological:  Positive for dizziness.  All other systems reviewed and are negative.  Physical Exam Updated Vital Signs BP (!) 150/83 (BP Location: Left Arm)    Pulse 73    Temp 98.2 F (36.8 C) (Oral)    Resp 15    Ht 5\' 2"  (1.575 m)    Wt 62.6 kg    LMP 02/04/1996    SpO2 97%    BMI 25.24 kg/m  Physical Exam Vitals and nursing note reviewed.  Constitutional:      General: She is not in acute distress.    Appearance: She is well-developed. She is not diaphoretic.  HENT:     Head: Normocephalic and atraumatic.     Mouth/Throat:     Mouth: Mucous membranes are moist.  Eyes:     Extraocular Movements: Extraocular movements intact.     Pupils: Pupils are equal, round, and reactive to light.  Cardiovascular:     Rate and Rhythm: Normal rate and regular rhythm.     Heart sounds: No murmur heard.   No friction rub. No gallop.  Pulmonary:     Effort: Pulmonary effort is normal. No respiratory distress.     Breath sounds: Normal breath sounds. No wheezing.  Abdominal:     General: Bowel sounds are normal. There is no distension.     Palpations: Abdomen is soft.     Tenderness: There is no abdominal tenderness.  Musculoskeletal:        General: Normal range of motion.     Cervical back: Normal range of motion and neck supple.  Skin:    General: Skin is warm and dry.  Neurological:     General: No focal deficit present.     Mental Status: She is alert and oriented to person, place, and time.     Cranial Nerves: No cranial nerve deficit.     Sensory: No sensory deficit.     Motor: No weakness.     Coordination: Coordination normal.    ED Results / Procedures / Treatments   Labs (all labs ordered are listed, but only abnormal  results are displayed) Labs Reviewed  CBG MONITORING, ED - Abnormal; Notable for the following components:      Result Value   Glucose-Capillary 105 (*)    All other components within normal limits  COMPREHENSIVE METABOLIC PANEL  CBC WITH DIFFERENTIAL/PLATELET  TROPONIN I (HIGH SENSITIVITY)    EKG EKG Interpretation  Date/Time:  Thursday February 14 2021 06:23:06 EST Ventricular Rate:  73 PR Interval:    QRS Duration: 104 QT Interval:  409 QTC  Calculation: 451 R Axis:   77 Text Interpretation: Accelerated junctional rhythm Borderline repolarization abnormality Confirmed by Veryl Speak [phone]) on 02/14/2021 7:02:34 AM  Radiology CUP PACEART REMOTE DEVICE CHECK  Result Date: 02/12/2021 Scheduled remote reviewed. Normal device function.  Battery estimated 55mo Next remote 2/9 LA   Procedures Procedures  Continuous cardiac monitoring  Medications Ordered in ED Medications  sodium chloride 0.9 % bolus 1,000 mL (has no administration in time range)    ED Course/ Medical Decision Making/ A&P  Patient presenting today with complaints of dizziness and lightheadedness.  This started at approximately 5 AM when waking this morning.  Remainder of symptoms and history described in the HPI.  She arrives here with stable vital signs and is neurologically intact.  Symptoms not consistent with stroke or LVO, so code stroke not initiated.  Work-up ordered including laboratory studies, EKG, and CT scan of the head.  These results are currently pending.  Care will be signed out to Dr. Dina Rich at shift change.  She will obtain the results of the above studies and determine the final disposition.  I do anticipate discharge and suspect symptoms may be related to taking the herbal supplement to help her sleep.  She took this medication for the first time last night.  Final Clinical Impression(s) / ED Diagnoses Final diagnoses:  None    Rx / DC Orders ED Discharge Orders     None          Veryl Speak, MD 02/15/21 [phone]

## 2021-02-20 DIAGNOSIS — E785 Hyperlipidemia, unspecified: Secondary | ICD-10-CM | POA: Diagnosis not present

## 2021-02-20 DIAGNOSIS — I10 Essential (primary) hypertension: Secondary | ICD-10-CM | POA: Diagnosis not present

## 2021-02-21 NOTE — Progress Notes (Signed)
Remote pacemaker transmission.   

## 2021-02-25 ENCOUNTER — Other Ambulatory Visit: Payer: Self-pay

## 2021-02-25 ENCOUNTER — Other Ambulatory Visit: Payer: Medicare Other

## 2021-02-25 DIAGNOSIS — E871 Hypo-osmolality and hyponatremia: Secondary | ICD-10-CM | POA: Diagnosis not present

## 2021-02-25 DIAGNOSIS — I5032 Chronic diastolic (congestive) heart failure: Secondary | ICD-10-CM | POA: Diagnosis not present

## 2021-02-25 DIAGNOSIS — K5900 Constipation, unspecified: Secondary | ICD-10-CM | POA: Diagnosis not present

## 2021-02-25 DIAGNOSIS — M810 Age-related osteoporosis without current pathological fracture: Secondary | ICD-10-CM | POA: Diagnosis not present

## 2021-02-25 DIAGNOSIS — I4811 Longstanding persistent atrial fibrillation: Secondary | ICD-10-CM

## 2021-02-25 DIAGNOSIS — H04123 Dry eye syndrome of bilateral lacrimal glands: Secondary | ICD-10-CM | POA: Diagnosis not present

## 2021-02-25 LAB — BASIC METABOLIC PANEL
BUN/Creatinine Ratio: 18 (ref 12–28)
BUN: 14 mg/dL (ref 8–27)
CO2: 28 mmol/L (ref 20–29)
Calcium: 9 mg/dL (ref 8.7–10.3)
Chloride: 100 mmol/L (ref 96–106)
Creatinine, Ser: 0.77 mg/dL (ref 0.57–1.00)
Glucose: 86 mg/dL (ref 70–99)
Potassium: 3.4 mmol/L — ABNORMAL LOW (ref 3.5–5.2)
Sodium: 140 mmol/L (ref 134–144)
eGFR: 79 mL/min/{1.73_m2} (ref >59)

## 2021-03-01 ENCOUNTER — Other Ambulatory Visit: Payer: Self-pay | Admitting: *Deleted

## 2021-03-01 DIAGNOSIS — Z79899 Other long term (current) drug therapy: Secondary | ICD-10-CM

## 2021-03-01 MED ORDER — POTASSIUM CHLORIDE CRYS ER 10 MEQ PO TBCR: 20.0000 meq | EXTENDED_RELEASE_TABLET | Freq: Two times a day (BID) | ORAL | 1 refills | Status: DC

## 2021-03-01 MED ORDER — FUROSEMIDE 40 MG PO TABS: 80.0000 mg | ORAL_TABLET | Freq: Every day | ORAL | 1 refills | Status: DC

## 2021-03-07 ENCOUNTER — Telehealth: Payer: Self-pay | Admitting: *Deleted

## 2021-03-07 DIAGNOSIS — S62626A Displaced fracture of medial phalanx of right little finger, initial encounter for closed fracture: Secondary | ICD-10-CM | POA: Diagnosis not present

## 2021-03-07 DIAGNOSIS — M79644 Pain in right finger(s): Secondary | ICD-10-CM | POA: Diagnosis not present

## 2021-03-07 NOTE — Telephone Encounter (Signed)
° °  Pre-operative Risk Assessment    Patient Name: [name] [name]  DOB: 08-01-1940 MRN: [id number]      Request for Surgical Clearance    Procedure:   Right finger phalanx closed reduction  Date of Surgery:  Clearance 03/11/21                                 Surgeon:  Dr Iran Planas   Surgeon's Group or Practice Name:  [facility] Phone number:  [phone] Fax number:  [phone] attn Adline Potter   Type of Clearance Requested:   - Pharmacy:  Hold Apixaban (Eliquis)     Type of Anesthesia:  Not Indicated   Additional requests/questions:    Gillermina Phy   03/07/2021, 2:11 PM

## 2021-03-07 NOTE — Telephone Encounter (Addendum)
° °  Name: [name] [name]  DOB: 1940/03/14  MRN: [id number]   Primary Cardiologist: Mertie Moores, MD  Chart reviewed as part of pre-operative protocol coverage. Patient was contacted 03/07/2021 in reference to pre-operative risk assessment for pending surgery as outlined below.  [name] [name] was last seen on 02/11/2021 by [name] Berlin PA-C.  Since that day, [name] [name] has done well without exertional chest pain or worsening dyspnea.  Therefore, based on ACC/AHA guidelines, the patient would be at acceptable risk for the planned procedure without further cardiovascular testing.   Patient may hold Eliquis for 2 days prior to the surgery and restart as soon as possible afterward at the surgeon's discretion.  The patient was advised that if she develops new symptoms prior to surgery to contact our office to arrange for a follow-up visit, and she verbalized understanding.  I will route this recommendation to the requesting party via Epic fax function and remove from pre-op pool. Please call with questions.  [place], [place] 03/07/2021, 6:10 PM

## 2021-03-07 NOTE — Telephone Encounter (Signed)
Clinical pharmacist to review Eliquis 

## 2021-03-07 NOTE — Telephone Encounter (Signed)
Patient with diagnosis of afib on Eliquis for anticoagulation.    Procedure: Right finger phalanx closed reduction Date of procedure: 03/11/21  CHA2DS2-VASc Score = 6  This indicates a 9.7% annual risk of stroke. The patient's score is based upon: CHF History: 1 HTN History: 1 Diabetes History: 0 Stroke History: 0 Vascular Disease History: 1 (aortic atherosclerosis noted on abdominal CT) Age Score: 2 Gender Score: 1  CrCl 89mL/min Platelet count 190K  Per office protocol, patient can hold Eliquis for 2 days prior to procedure.

## 2021-03-11 DIAGNOSIS — X58XXXA Exposure to other specified factors, initial encounter: Secondary | ICD-10-CM | POA: Diagnosis not present

## 2021-03-11 DIAGNOSIS — S62626D Displaced fracture of medial phalanx of right little finger, subsequent encounter for fracture with routine healing: Secondary | ICD-10-CM | POA: Diagnosis not present

## 2021-03-11 DIAGNOSIS — S62626A Displaced fracture of medial phalanx of right little finger, initial encounter for closed fracture: Secondary | ICD-10-CM | POA: Diagnosis not present

## 2021-03-11 DIAGNOSIS — Y999 Unspecified external cause status: Secondary | ICD-10-CM | POA: Diagnosis not present

## 2021-03-14 ENCOUNTER — Ambulatory Visit (INDEPENDENT_AMBULATORY_CARE_PROVIDER_SITE_OTHER): Payer: Medicare Other

## 2021-03-14 DIAGNOSIS — I495 Sick sinus syndrome: Secondary | ICD-10-CM

## 2021-03-16 LAB — CUP PACEART REMOTE DEVICE CHECK
Battery Remaining Longevity: 2 mo
Battery Voltage: 2.84 V
Brady Statistic AP VP Percent: 18.07 %
Brady Statistic AP VS Percent: 63.86 %
Brady Statistic AS VP Percent: 3.19 %
Brady Statistic AS VS Percent: 14.89 %
Brady Statistic RA Percent Paced: 80.26 %
Brady Statistic RV Percent Paced: 20.09 %
Date Time Interrogation Session: 20230210115602
Implantable Lead Implant Date: 20140917
Implantable Lead Implant Date: 20140917
Implantable Lead Location: 753859
Implantable Lead Location: 753860
Implantable Pulse Generator Implant Date: 20140917
Lead Channel Impedance Value: 285 Ohm
Lead Channel Impedance Value: 323 Ohm
Lead Channel Impedance Value: 323 Ohm
Lead Channel Impedance Value: 399 Ohm
Lead Channel Pacing Threshold Amplitude: 0.625 V
Lead Channel Pacing Threshold Amplitude: 0.75 V
Lead Channel Pacing Threshold Pulse Width: 0.4 ms
Lead Channel Pacing Threshold Pulse Width: 0.4 ms
Lead Channel Sensing Intrinsic Amplitude: 0.25 mV
Lead Channel Sensing Intrinsic Amplitude: 0.25 mV
Lead Channel Sensing Intrinsic Amplitude: 16.625 mV
Lead Channel Sensing Intrinsic Amplitude: 16.625 mV
Lead Channel Setting Pacing Amplitude: 2 V
Lead Channel Setting Pacing Amplitude: 2.5 V
Lead Channel Setting Pacing Pulse Width: 0.4 ms
Lead Channel Setting Sensing Sensitivity: 0.9 mV

## 2021-03-18 ENCOUNTER — Other Ambulatory Visit: Payer: Medicare Other | Admitting: *Deleted

## 2021-03-18 ENCOUNTER — Other Ambulatory Visit: Payer: Self-pay

## 2021-03-18 DIAGNOSIS — S62626D Displaced fracture of medial phalanx of right little finger, subsequent encounter for fracture with routine healing: Secondary | ICD-10-CM | POA: Diagnosis not present

## 2021-03-18 DIAGNOSIS — Z79899 Other long term (current) drug therapy: Secondary | ICD-10-CM

## 2021-03-18 DIAGNOSIS — Z4789 Encounter for other orthopedic aftercare: Secondary | ICD-10-CM | POA: Diagnosis not present

## 2021-03-18 DIAGNOSIS — S62626A Displaced fracture of medial phalanx of right little finger, initial encounter for closed fracture: Secondary | ICD-10-CM | POA: Diagnosis not present

## 2021-03-18 LAB — BASIC METABOLIC PANEL
BUN/Creatinine Ratio: 13 (ref 12–28)
BUN: 10 mg/dL (ref 8–27)
CO2: 26 mmol/L (ref 20–29)
Calcium: 9.6 mg/dL (ref 8.7–10.3)
Chloride: 101 mmol/L (ref 96–106)
Creatinine, Ser: 0.77 mg/dL (ref 0.57–1.00)
Glucose: 76 mg/dL (ref 70–99)
Potassium: 3.7 mmol/L (ref 3.5–5.2)
Sodium: 141 mmol/L (ref 134–144)
eGFR: 79 mL/min/{1.73_m2} (ref >59)

## 2021-03-19 NOTE — Progress Notes (Signed)
Remote pacemaker transmission.   

## 2021-03-19 NOTE — Addendum Note (Signed)
Addended by: Cheri Kearns A on: 03/19/2021 01:53 PM   Modules accepted: Level of Service

## 2021-03-25 DIAGNOSIS — M79644 Pain in right finger(s): Secondary | ICD-10-CM | POA: Diagnosis not present

## 2021-03-26 ENCOUNTER — Encounter: Payer: Self-pay | Admitting: Physician Assistant

## 2021-03-26 NOTE — Telephone Encounter (Signed)
error 

## 2021-03-28 DIAGNOSIS — S62626A Displaced fracture of medial phalanx of right little finger, initial encounter for closed fracture: Secondary | ICD-10-CM | POA: Diagnosis not present

## 2021-03-28 DIAGNOSIS — S62626D Displaced fracture of medial phalanx of right little finger, subsequent encounter for fracture with routine healing: Secondary | ICD-10-CM | POA: Diagnosis not present

## 2021-03-28 DIAGNOSIS — Z4789 Encounter for other orthopedic aftercare: Secondary | ICD-10-CM | POA: Diagnosis not present

## 2021-04-02 DIAGNOSIS — S62626A Displaced fracture of medial phalanx of right little finger, initial encounter for closed fracture: Secondary | ICD-10-CM | POA: Diagnosis not present

## 2021-04-02 DIAGNOSIS — Z4789 Encounter for other orthopedic aftercare: Secondary | ICD-10-CM | POA: Diagnosis not present

## 2021-04-03 DIAGNOSIS — M79644 Pain in right finger(s): Secondary | ICD-10-CM | POA: Diagnosis not present

## 2021-04-08 ENCOUNTER — Other Ambulatory Visit: Payer: Self-pay

## 2021-04-08 ENCOUNTER — Ambulatory Visit (INDEPENDENT_AMBULATORY_CARE_PROVIDER_SITE_OTHER): Payer: Medicare Other | Admitting: Obstetrics & Gynecology

## 2021-04-08 ENCOUNTER — Encounter (HOSPITAL_BASED_OUTPATIENT_CLINIC_OR_DEPARTMENT_OTHER): Payer: Self-pay | Admitting: Obstetrics & Gynecology

## 2021-04-08 VITALS — BP 115/70 | HR 62 | Ht 61.0 in | Wt 135.2 lb

## 2021-04-08 DIAGNOSIS — Z01419 Encounter for gynecological examination (general) (routine) without abnormal findings: Secondary | ICD-10-CM | POA: Diagnosis not present

## 2021-04-08 DIAGNOSIS — Z9229 Personal history of other drug therapy: Secondary | ICD-10-CM

## 2021-04-08 DIAGNOSIS — G20 Parkinson's disease: Secondary | ICD-10-CM | POA: Diagnosis not present

## 2021-04-08 DIAGNOSIS — M810 Age-related osteoporosis without current pathological fracture: Secondary | ICD-10-CM | POA: Diagnosis not present

## 2021-04-08 NOTE — Progress Notes (Signed)
81 y.o. G2P2 Married White or Caucasian female here for breast and pelvic exam.  I am also following her for breast and pelvic exam.  She has seen functional medicine provider in [place] who recommended HRT.  We discussed this last year.  I do not recommend she start anything with estrogen due to risks of stroke.  She has had prescriptions filled and brought them today for me to look at. ? ?Denies vaginal bleeding. ? ?Patient's last menstrual period was 02/04/1996.          ?Sexually active: Yes.    ?H/O STD:  no ? ?Health Maintenance: ?PCP:  Dr. Joylene Draft.  Last wellness appt was during the past year.  Did blood work at that appt:  yes ?Vaccines are up to date:  yes ?Colonoscopy:  07/2020 with Dr. Cristina Gong, no follow up needed ?MMG:  07/2020 ?BMD:  does with Dr Joylene Draft ?Last pap smear:  10/2016 ?H/o abnormal pap smear:  remote hx ? ? ? reports that she has never smoked. She has never used smokeless tobacco. She reports that she does not drink alcohol and does not use drugs. ? ?Past Medical History:  ?Diagnosis Date  ? Anemia   ? Arthritis   ? thumbs  ? Atrial tachycardia ([place])   ? Atypical atrial flutter ([place])   ? Cataract   ? bilateral  ? Chronic anticoagulation   ? on Xarelto  ? Chronic diastolic CHF (congestive heart failure) ([place])   ? Echocardiogram (11/24/12): Mild LVH, EF 60%.  ? Dysrhythmia   ? GERD (gastroesophageal reflux disease)   ? hx of no meds  ? Heart murmur   ? slight  ? Hyperlipidemia   ? a. Hx of leg weakness while on statin. under control  ? Hypertension   ? Hypothyroidism   ? Leg fracture Dec 21,2011  ? Osteoporosis 07/2007  ? Parkinson's disease ([place]) 2011  ? Persistent atrial fibrillation ([facility])   ? s/p atrial fib ablation 11-11-11.   ? PONV (postoperative nausea and vomiting)   ? Presence of permanent cardiac pacemaker   ? permanent Medtronic  ? Shortness of breath   ? "when walking at incline or stairs"  ? 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  Wrist fracture   ? right  ? ? ?Past Surgical History:  ?Procedure Laterality Date  ? ATRIAL FIBRILLATION ABLATION N/A 11/11/2011  ? Procedure: ATRIAL FIBRILLATION ABLATION;  Surgeon: Thompson Grayer, MD;  Location: [facility] CATH LAB;  Service: Cardiovascular;  Laterality: N/A;  ? BREAST BIOPSY  1998  ? BREAST SURGERY    ? benign cyst removed  ? CARDIAC ELECTROPHYSIOLOGY STUDY AND ABLATION  5/14, 7/14  ? convergent AF ablation at [facility] and subsequent atypical atrial flutter ablation by Dr Lehman Prom  ? CARDIOVASCULAR STRESS TEST  06-06-2008  ? EF 73%  ? CARDIOVERSION  09/09/2011  ? Procedure: CARDIOVERSION;  Surgeon: Darlin Coco, MD;  Location: [facility] ENDOSCOPY;  Service: Cardiovascular;  Laterality: N/A;  to be done by dr. Mare Ferrari  ? CARDIOVERSION  02/18/2012  ? Procedure: CARDIOVERSION;  Surgeon: Thayer Headings, MD;  Location: [facility];  Service: Cardiovascular;  Laterality: N/A;  ? CHOLECYSTECTOMY  1995  ? FEMUR FRACTURE SURGERY  2011  ? metal pin in place  ? FOREARM SURGERY Left 2010  ? "opened arm to drain it"  ? INSERTION OF MESH N/A 11/24/2013  ? Procedure: INSERTION OF MESH;  Surgeon: Michael Boston, MD;  Location: [facility] ORS;  Service: General;  Laterality: N/A;  ? KNEE SURGERY Left 2001  ? Left unicompartmental knee    ? Dr. Alvan Dame 11/18/16  ? PACEMAKER PLACEMENT  10/20/12  ? MDT Advisa DR implanted by Dr Lehman Prom at [facility]   ? PARTIAL KNEE ARTHROPLASTY Left 11/18/2016  ? Procedure: LEFT UNICOMPARTMENTAL KNEE Medially;  Surgeon: Paralee Cancel, MD;  Location: [facility] OR[facility];  Service: Orthopedics;  Laterality: Left;  90 mins  ? SKIN CANCER DESTRUCTION  summer 2015  ? TEE WITHOUT CARDIOVERSION  11/10/2011  ? Procedure: TRANSESOPHAGEAL ECHOCARDIOGRAM (TEE);  Surgeon: Thayer Headings, MD;  Location: [facility];  Service: Cardiovascular;  Laterality: N/A;  ? TONSILLECTOMY  as child  ? US ECHOCARDIOGRAPHY  03-10-2008  ? Est EF 55-60%, Dr. Cathie Olden  ? VENTRAL HERNIA REPAIR N/A 11/24/2013  ? Procedure: LAPAROSCOPIC VENTRAL HERNIA;  Surgeon: Michael Boston, MD;  Location: [facility];  Service: General;  Laterality: N/A;  ? WRIST SURGERY Right ~2009  ? metal rod in place  ? ? ?Current Outpatient Medications  ?Medication Sig Dispense Refill  ? CALCIUM PO Take 1 tablet by mouth at bedtime.    ? Calcium Polycarbophil (FIBERCON PO) Take 2 tablets by mouth every morning.    ? carbidopa-levodopa (SINEMET IR) 25-100 MG tablet Take 1.5 tablets by mouth 3 (three) times daily. 405 tablet 1  ? cyanocobalamin (,VITAMIN B-12,) 1000 MCG/ML injection INJECT 1ML INTRAMUSCULARLY IN THE DELTOID ONCE A MONTH (ALTERNATING DELT. EACH MONTH)    ? cyanocobalamin 100 MCG tablet Take 100 mcg by mouth daily.    ? Docusate Sodium (COLACE PO) Take 1 capsule by mouth 2 (two) times daily as needed (constipation).    ? ELIQUIS 5 MG TABS tablet TAKE 1 TABLET BY MOUTH TWICE A DAY 60 tablet 5  ? ezetimibe (ZETIA) 10 MG tablet Take 10 mg by mouth every other day. Taking the same day as the crestor    ? furosemide (LASIX) 40 MG tablet Take 2 tablets (80 mg total) by mouth daily. 180 tablet 1  ? levothyroxine (SYNTHROID) 50 MCG tablet Take 50 mcg by mouth daily. Pt take 1 1/2 tablet on Monday and Thursday and 1 tablet the rest of the week    ? Magnesium Oxide 500 MG TABS Take 500 mg by mouth daily.    ? metoprolol succinate (TOPROL-XL) 25 MG 24 hr tablet TAKE 1 TABLET BY MOUTH DAILY. 90 tablet 1  ? multivitamin-lutein (OCUVITE-LUTEIN) CAPS capsule Take 1 capsule by mouth daily.    ? NON FORMULARY Liposomal Glutathione take 1 table qd    ? potassium chloride (KLOR-CON M) 10 MEQ tablet Take 2 tablets (20 mEq total) by mouth 2 (two) times daily. 360 tablet 1  ? Probiotic Product (PROBIOTIC-10 PO) Take 1 tablet by mouth daily. Lexine Baton    ? rasagiline (AZILECT) 1 MG TABS tablet Take 1 tablet (1 mg total) by mouth daily. 90 tablet 1  ? rosuvastatin (CRESTOR) 5 MG tablet Take 2.5 mg by mouth every other day.    ? valACYclovir (VALTREX) 1000 MG tablet Take 1,000 mg by mouth 2 (two) times daily as needed  (fever blisters).    ? VITAMIN D, ERGOCALCIFEROL, PO Take 1,000 Units by mouth daily.     ? ?No current facility-administered medications for this visit.  ? ? ?Family History  ?Problem Relation Age of Onset  ? Alzheimer's disease Mother   ? Heart failure Father   ? Healthy Child   ? Healthy Child   ?  Breast cancer Maternal Aunt   ? ? ?Review of Systems ? ?Exam:   ?BP 115/70   Pulse 62   Ht '5\' 1"'$  (1.549 m)   Wt 135 lb 3.2 oz (61.3 kg)   LMP 02/04/1996   BMI 25.55 kg/m?   Height: '5\' 1"'$  (154.9 cm) ? ?General appearance: alert, cooperative and appears stated age ?Breasts: normal appearance, no masses or tenderness ?Abdomen: soft, non-tender; bowel sounds normal; no masses,  no organomegaly ?Lymph nodes: Cervical, supraclavicular, and axillary nodes normal.  No abnormal inguinal nodes palpated ?Neurologic: Grossly normal ? ?Pelvic: External genitalia:  no lesions ?             Urethra:  normal appearing urethra with no masses, tenderness or lesions ?             Bartholins and Skenes: normal    ?             Vagina: normal appearing vagina with atrophic changes and no discharge, no lesions ?             Cervix: no lesions ?             Pap taken: No. ?Bimanual Exam:  Uterus:  normal size, contour, position, consistency, mobility, non-tender ?             Adnexa: normal adnexa and no mass, fullness, tenderness ?              Rectovaginal: Confirms ?              Anus:  normal sphincter tone, no lesions ? ?Chaperone, Octaviano Batty, CMA, was present for exam. ? ?Assessment/Plan: ?1. Encntr for gyn exam (general) (routine) w/o abn findings ?- pap not indicated ?- MMG 07/2020 ?- colonoscopy 07/2020 ?- BMD with Dr. Joylene Draft ?- lab work done with Dr. Joylene Draft ?- vaccines up to date and reviewed ? ?2. History of postmenopausal HRT ?- do not recommend pt restart HRT.  I really do feel this is not in her best interest at this time ? ?3. Age-related osteoporosis without current pathological fracture ? ?4. Parkinson's disease ([place]) ?-  followed by Dr. Carles Collet  ? ? ?

## 2021-04-10 DIAGNOSIS — L821 Other seborrheic keratosis: Secondary | ICD-10-CM | POA: Diagnosis not present

## 2021-04-10 DIAGNOSIS — Z85828 Personal history of other malignant neoplasm of skin: Secondary | ICD-10-CM | POA: Diagnosis not present

## 2021-04-10 DIAGNOSIS — L820 Inflamed seborrheic keratosis: Secondary | ICD-10-CM | POA: Diagnosis not present

## 2021-04-12 NOTE — Progress Notes (Signed)
? ? ?Assessment/Plan:  ? ?1.  Parkinsons Disease ? -Increase carbidopa/levodopa 25/100, 2 tablets three times per day and move dosages to 8am/noon/4pm ? -Continue carbidopa/levodopa 50/200 at bedtime ? -Continue rasagiline, 1 mg daily ? -Has moderate Parkinson's dyskinesia (when on - she is off today), but really not bothersome to her and she does not even notice it most of the time.  Therefore, no treatment required. ? ?2.  RBD/insomnia ? -On clonazepam, 0.5 mg, half tablet at bedtime. ? -add melatonin, 3 mg at bed. ?3.  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 -Follows with hematology ? -Receives IV iron as needed. ?4.  Atrial fibrillation ? -Follows with cardiology.  On Eliquis. ? ? ?Subjective:  ? ?[name] [name] was seen today in follow up for Parkinsons disease.  My previous records were reviewed prior to todays visit as well as outside records available to me.  She is with her husband who supplements the history.  Pt had fall in early and broke her "pinky finger" which required surgery.  She states that she was turned and foot just got stuck and she fell.  It required surgery the following week.    Pt denies lightheadedness, near syncope.  She was in the emergency room for weakness in early January, but was hypokalemic and was felt to be due to the laxatives that she was taking.  No hallucinations.  Mood has been good. ? ? ?Current prescribed movement disorder medications: ?Carbidopa/levodopa 25/100, 1.5 tablet 3 times per day (she is taking at 8am/1:30pm/8pm) ?Carbidopa/levodopa 50/200 at bedtime ?azilect ?Klonopin, 1/2 po q hs (PDMP reveals no red flags.  Last filled October 10, 2020) ? ? ? ?ALLERGIES:   ?Allergies  ?Allergen Reactions  ? Amiodarone Other (See Comments)  ? Bisphosphonates Other (See Comments)  ? Statins   ?  Leg weakness - but tolerating low dose Crestor every other day  ? ? ?CURRENT MEDICATIONS:  ?Outpatient Encounter Medications as of 04/15/2021  ?Medication Sig  ?  CALCIUM PO Take 1 tablet by mouth at bedtime.  ? Calcium Polycarbophil (FIBERCON PO) Take 2 tablets by mouth every morning.  ? cyanocobalamin (,VITAMIN B-12,) 1000 MCG/ML injection INJECT 1ML INTRAMUSCULARLY IN THE DELTOID ONCE A MONTH (ALTERNATING DELT. EACH MONTH)  ? cyanocobalamin 100 MCG tablet Take 100 mcg by mouth daily.  ? Docusate Sodium (COLACE PO) Take 1 capsule by mouth 2 (two) times daily as needed (constipation).  ? ELIQUIS 5 MG TABS tablet TAKE 1 TABLET BY MOUTH TWICE A DAY  ? ezetimibe (ZETIA) 10 MG tablet Take 10 mg by mouth every other day. Taking the same day as the crestor  ? furosemide (LASIX) 40 MG tablet Take 2 tablets (80 mg total) by mouth daily.  ? levothyroxine (SYNTHROID) 50 MCG tablet Take 50 mcg by mouth daily. Pt take 1 1/2 tablet on Monday and Thursday and 1 tablet the rest of the week  ? Magnesium Oxide 500 MG TABS Take 500 mg by mouth daily.  ? metoprolol succinate (TOPROL-XL) 25 MG 24 hr tablet TAKE 1 TABLET BY MOUTH DAILY.  ? multivitamin-lutein (OCUVITE-LUTEIN) CAPS capsule Take 1 capsule by mouth daily.  ? NON FORMULARY Liposomal Glutathione take 1 table qd  ? potassium chloride (KLOR-CON M) 10 MEQ tablet Take 2 tablets (20 mEq total) by mouth 2 (two) times daily.  ? Probiotic Product (PROBIOTIC-10 PO) Take 1 tablet by mouth daily. Lexine Baton  ? rasagiline (AZILECT) 1 MG TABS tablet Take 1 tablet (1 mg total)  by mouth daily.  ? rosuvastatin (CRESTOR) 5 MG tablet Take 2.5 mg by mouth every other day.  ? valACYclovir (VALTREX) 1000 MG tablet Take 1,000 mg by mouth 2 (two) times daily as needed (fever blisters).  ? VITAMIN D, ERGOCALCIFEROL, PO Take 1,000 Units by mouth daily.   ? [DISCONTINUED] carbidopa-levodopa (SINEMET IR) 25-100 MG tablet Take 1.5 tablets by mouth 3 (three) times daily.  ? carbidopa-levodopa (SINEMET IR) 25-100 MG tablet Take 2 tablets by mouth 3 (three) times daily.  ? ?No facility-administered encounter medications on file as of 04/15/2021.  ? ? ?Objective:   ? ?PHYSICAL EXAMINATION:   ? ?VITALS:   ?Vitals:  ? 04/15/21 1357  ?BP: 126/79  ?Pulse: 72  ?SpO2: 97%  ?Weight: 134 lb 6.4 oz (61 kg)  ?Height: '5\' 2"'$  (1.575 m)  ? ? ? ? ?GEN:  The patient appears stated age and is in NAD. ?HEENT:  Normocephalic, atraumatic.  The mucous membranes are moist. The superficial temporal arteries are without ropiness or tenderness. ?CV:  RRR ?Lungs:  CTAB ?Neck/HEME:  There are no carotid bruits bilaterally. ? ?Neurological examination: ? ?Orientation: The patient is alert and oriented x3. ?Cranial nerves: There is good facial symmetry with facial hypomimia. The speech is fluent and clear. Soft palate rises symmetrically and there is no tongue deviation. Hearing is intact to conversational tone. ?Sensation: Sensation is intact to light touch throughout ?Motor: Strength is at least antigravity x4. ? ?Movement examination: ?Tone: There is nl tone in the ue/le ?Abnormal movements: there no dyskinesia today but there is RLE rest tremor with distraction (just took med at about 2pm) ?Coordination:  There is mod decremation on the R ?Gait and Station: The patient has no difficulty arising out of a deep-seated chair without the use of the hands. The patient's stride length is good.  The patient has a neg pull test.    ? ? ? ?Total time spent on today's visit was 20  minutes, including both face-to-face time and nonface-to-face time.  Time included that spent on review of records (prior notes available to me/labs/imaging if pertinent), discussing treatment and goals, answering patient's questions and coordinating care. ? ?Cc:  Crist Infante, MD ? ?

## 2021-04-12 NOTE — Progress Notes (Unsigned)
Cardiology Office Note Date:  04/12/2021  Patient ID:  [name] [name], [name] [name] February 06, 1940, MRN [id number] PCP:  Crist Infante, MD  Electrophysiologist:  Dr. Ky Barban    Chief Complaint:   planned f/u  History of Present Illness: [name] [name] is a 81 y.o. female with history of HTN, HLD, Parkinson's disease  (Dr. 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 Osker Mason), chronic CHF (diastolic)  I saw her 8/78/67 She is doing well.  She is not a steady on her feet with the Parkinon's, though no stumbles, falls.  She denies any CP, occassionally has an awareness of her heart beat, but not fast, irregular.  No rest SOB, she sleeps weill with no symptoms or PND or orthopnea.  She has some baseline DOE that is unchanged. She was having PAFib by her EKGs/remotes pacer programmed MVP  She saw Dr. Rayann Heman in f/u Sept 2021 feeling well though mentioned some SOB, AF burden only 4%, was planned for stress and echo and if low risk could be cleared for EGD she was pending  Oct 2021: TTE with LVEF 55-60%, RVSP 52.89mHg, stress as low risk, no ischemia/infarct  More recently she saw A. Tillery PA-C Sept 2022, SOB was improved though not completely resolved with adjustment in her lasix, no changes were made, she was cleared for colonoscopy  Pre-op pool and RPH have commented and cleared   I saw her 12/31/20 She is doing "OK I guess", she is getting the colonoscopy 2/2 heme + stools, denies obvious bleeding or bleeding otherwise. She confirms that she has been said to be anemic for quite a long time, that is not new for her. She denies any CP, palpitations or cardiac awareness. She does say though that while generally for years she does not have much energy in the last month of so she will have an hour here and there that she feels somewhat suddenly really tired.  No t weak, not lightheaded, just  tired and then for no clear reason feels better/back to her baseline. No clear trigger, rhyme/reason to it. No fever, illness No changes to meds PMD does labs  AF burden was up, though she also reported increased personal stress, was being evaluated for anemia, pending GI/scope Home transmitter not working, she was nearing ERI. Planned for her to f/u with device clinic once home to trouble shoot her transmitter and have early follow up to monitor AF burden and ensure home monitor function with nearing ERI  + remotes, AF burden last <0.1% SOB, fatigue, ? anemia  I saw her 02/11/21 She is doing well NO bleeding or signs of bleeding GI w/u was good, no bleeding found, pending further d/w her PMD. She feels dehydrated, mouth is dry and would like to reduce her lasix dose. She feels like generally she is feeling OK No CP or palpitations Denies any ongoing SOB, and probably a bit better with energy, but doesn't tend to sleep well. No near syncope or syncope Eyes are itchy and red a couple days, no pain, no drainage, thought was allergy but plans to see her PMD or eye doctor Planned to reduce her lasix/K+, follow for volume OL at home, f/u labs planned Monthly battery checks nearing ERI  02/14/21: ER visit, dizziness, nonspecific , not feeling right,  had taken extra laxative for constipation and a new supplement CT was negative, labs only notable for hypokalemia (3.4) suspect 2/2 laxative use  and increased BM, replaced orally and given IVF EKG reported as an accelerated junctional rhythm, 73bpm  *** symptoms *** battery *** low ampl P waves, AS noted on device remote *** eliquis, bleeding, labs, dose *** volume   Device information MDT dual chamber PPM implanted 10/20/2012  AFib Hx Diagnosed 2006 PVI ablation 11/11/2011, Dr. Rayann Heman AAD HX Flecainide> amiodarone >> rate control strategy   Past Medical History:  Diagnosis Date   Anemia    Arthritis    thumbs   Atrial  tachycardia (HCC)    Atypical atrial flutter (HCC)    Cataract    bilateral   Chronic anticoagulation    on Xarelto   Chronic diastolic CHF (congestive heart failure) ([place])    Echocardiogram (11/24/12): Mild LVH, EF 60%.   Dysrhythmia    GERD (gastroesophageal reflux disease)    hx of no meds   Heart murmur    slight   Hyperlipidemia    a. Hx of leg weakness while on statin. under control   Hypertension    Hypothyroidism    Leg fracture Dec 21,2011   Osteoporosis 07/2007   Parkinson's disease ([place]) 2011   Persistent atrial fibrillation ([place])    s/p atrial fib ablation 11-11-11.    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[place])    Squamous carcinoma summer 2015   back of left thigh   Wrist fracture    right    Past Surgical History:  Procedure Laterality Date   ATRIAL FIBRILLATION ABLATION N/A 11/11/2011   Procedure: ATRIAL FIBRILLATION ABLATION;  Surgeon: [place] Grayer, MD;  [facility];  Service: Cardiovascular;  Laterality: N/A;   BREAST BIOPSY  1998   BREAST SURGERY     benign cyst removed   CARDIAC ELECTROPHYSIOLOGY STUDY AND ABLATION  5/14, 7/14   convergent AF ablation at [facility] and subsequent atypical atrial flutter ablation by Dr Lehman Prom   CARDIOVASCULAR STRESS TEST  06-06-2008   EF 73%   CARDIOVERSION  09/09/2011   Procedure: CARDIOVERSION;  Surgeon: Darlin Coco, MD;  Location: [place];  Service: Cardiovascular;  Laterality: N/A;  to be done by dr. Mare Ferrari   CARDIOVERSION  02/18/2012   Procedure: CARDIOVERSION;  Surgeon: Thayer Headings, MD;  Location: [facility] ENDOSCOPY;  Service: Cardiovascular;  Laterality: N/A;   [place]  2011   metal pin in place   FOREARM SURGERY Left 2010   "opened arm to drain it"   INSERTION OF MESH N/A 11/24/2013   Procedure:  INSERTION OF MESH;  Surgeon: Michael Boston, MD;  Location: [facility] ORS;  Service: General;  Laterality: N/A;   KNEE SURGERY Left 2001   Left unicompartmental knee     Dr. Alvan Dame 11/18/16   PACEMAKER PLACEMENT  10/20/12   MDT Advisa DR implanted by Dr Lehman Prom at [place] Left 11/18/2016   Procedure: LEFT UNICOMPARTMENTAL KNEE Medially;  Surgeon: Paralee Cancel, MD;  Location: [facility] ORS;  Service: Orthopedics;  Laterality: Left;  90 mins   SKIN CANCER DESTRUCTION  summer 2015   TEE WITHOUT CARDIOVERSION  11/10/2011   Procedure: TRANSESOPHAGEAL ECHOCARDIOGRAM (TEE);  Surgeon: Thayer Headings, MD;  Location: [place];  Service: Cardiovascular;  Laterality: N/A;   TONSILLECTOMY  as child   US ECHOCARDIOGRAPHY  03-10-2008  Est EF 55-60%, Dr. Cathie Olden   VENTRAL HERNIA REPAIR N/A 11/24/2013   Procedure: LAPAROSCOPIC VENTRAL HERNIA;  Surgeon: Michael Boston, MD;  Location: [facility];  Service: General;  Laterality: N/A;   WRIST SURGERY Right ~2009   metal rod in place    Current Outpatient Medications  Medication Sig Dispense Refill   CALCIUM PO Take 1 tablet by mouth at bedtime.     Calcium Polycarbophil (FIBERCON PO) Take 2 tablets by mouth every morning.     carbidopa-levodopa (SINEMET IR) 25-100 MG tablet Take 1.5 tablets by mouth 3 (three) times daily. 405 tablet 1   cyanocobalamin (,VITAMIN B-12,) 1000 MCG/ML injection INJECT 1ML INTRAMUSCULARLY IN THE DELTOID ONCE A MONTH (ALTERNATING DELT. EACH MONTH)     cyanocobalamin 100 MCG tablet Take 100 mcg by mouth daily.     Docusate Sodium (COLACE PO) Take 1 capsule by mouth 2 (two) times daily as needed (constipation).     ELIQUIS 5 MG TABS tablet TAKE 1 TABLET BY MOUTH TWICE A DAY 60 tablet 5   ezetimibe (ZETIA) 10 MG tablet Take 10 mg by mouth every other day. Taking the same day as the crestor     furosemide (LASIX) 40 MG tablet Take 2 tablets (80 mg total) by mouth daily. 180 tablet 1   levothyroxine (SYNTHROID) 50 MCG tablet Take  50 mcg by mouth daily. Pt take 1 1/2 tablet on Monday and Thursday and 1 tablet the rest of the week     Magnesium Oxide 500 MG TABS Take 500 mg by mouth daily.     metoprolol succinate (TOPROL-XL) 25 MG 24 hr tablet TAKE 1 TABLET BY MOUTH DAILY. 90 tablet 1   multivitamin-lutein (OCUVITE-LUTEIN) CAPS capsule Take 1 capsule by mouth daily.     NON FORMULARY Liposomal Glutathione take 1 table qd     potassium chloride (KLOR-CON M) 10 MEQ tablet Take 2 tablets (20 mEq total) by mouth 2 (two) times daily. 360 tablet 1   Probiotic Product (PROBIOTIC-10 PO) Take 1 tablet by mouth daily. Lafonda Mosses Brand     rasagiline (AZILECT) 1 MG TABS tablet Take 1 tablet (1 mg total) by mouth daily. 90 tablet 1   rosuvastatin (CRESTOR) 5 MG tablet Take 2.5 mg by mouth every other day.     valACYclovir (VALTREX) 1000 MG tablet Take 1,000 mg by mouth 2 (two) times daily as needed (fever blisters).     VITAMIN D, ERGOCALCIFEROL, PO Take 1,000 Units by mouth daily.      No current facility-administered medications for this visit.    Allergies:   Amiodarone, Bisphosphonates, and Statins   Social History:  The patient  reports that she has never smoked. She has never used smokeless tobacco. She reports that she does not drink alcohol and does not use drugs.   Family History:  The patient's family history includes Alzheimer's disease in her mother; Breast cancer in her maternal aunt; Healthy in her child and child; Heart failure in her father.  ROS:  Please see the history of present illness.    All other systems are reviewed and otherwise negative.   PHYSICAL EXAM:  VS:  LMP 02/04/1996  BMI: There is no height or weight on file to calculate BMI. Well nourished, well developed, in no acute distress  HEENT: normocephalic, atraumatic  Neck: no JVD, carotid bruits or masses Cardiac:  *** RRR; no significant murmurs, no rubs, or gallops Lungs:  *** CTA b/l, no wheezing, rhonchi or rales  Abd: soft,  nontender MS: no  deformity, age appropriate/perhaps advanced atrophy Ext: *** no edema  Skin: warm and dry, no rash Neuro:  Parkinson-like movements, other-wise no gross motor deficits appreciated Psych: euthymic mood, full affect  *** PPM site is stable, no tethering or discomfort   EKG:  not done today  PPM interrogation  ***     11/16/2019: stress myoview  Nuclear stress EF: 71%. The left ventricular ejection fraction is hyperdynamic (>65%). The study is normal. This is a low risk study.   Fixed apical to mid anteroseptal perfusion defect, with normal systolic function, suggests artifact.   11/16/2019: TTE IMPRESSIONS   1. Left ventricular ejection fraction, by estimation, is 55 to 60%. The  left ventricle has normal function. The left ventricle has no regional  wall motion abnormalities. Left ventricular diastolic function could not  be evaluated.   2. Right ventricular systolic function is normal. The right ventricular  size is normal. There is moderately elevated pulmonary artery systolic  pressure. The estimated right ventricular systolic pressure is 40.8 mmHg.   3. Left atrial size was moderately dilated.   4. Right atrial size was moderately dilated.   5. The mitral valve is normal in structure. Mild mitral valve  regurgitation. No evidence of mitral stenosis.   6. The aortic valve is normal in structure. Aortic valve regurgitation is  not visualized. No aortic stenosis is present.   Myoview 02/11/2018 Nuclear stress EF: 72%. The left ventricular ejection fraction is hyperdynamic (>65%). There was no ST segment deviation noted during stress. This is a low risk study. No evidence of ischemia. The study is normal.   Echocardiogram 02/11/2018 EF 60-65, normal wall motion, normal diastolic function, trivial AI, mild MR, normal RV SF, mild TR, PASP 40   cPET 04/25/2015 Conclusion: Exercise testing with gas exchange demonstrates a  moderate functional impairment when compared to  matched sedentary  norms. At peak exercise, patient appears primarily circulatory  limited with hypotensive response, low-flat O2 pulse and mild  chronotropic incompetence. Pre-exercise spirometry suggests mild  restrictive lung physiology.   Echocardiogram 03/20/2015 EF 55-60, normal wall motion, trivial AI, mild TR, moderate BAE, moderate TR, PASP 49   Cardiac catheterization 05/25/2012 Coronary angiography: Coronary dominance: left Left mainstem: Short, no CAD.  Left anterior descending (LAD): No angiographic CAD.  Left circumflex (LCx): Large dominant vessel with no angiographic CAD.  Right coronary artery (RCA): Small, nondominant vessel with no angiographic CAD.   Left ventriculography: Left ventricular systolic function is normal, LVEF is estimated at 55-60%, there is no significant mitral regurgitation   Final Conclusions:  Normal LV systolic function, no angiographic CAD.   11/11/2011: EPS/ablation PROCEDURES: 1. Comprehensive electrophysiologic study. 2. Coronary sinus pacing and recording. 3. Three-dimensional mapping of atrial fibrillation with additional mapping and ablation of discrete foci within the left atrium 4. Ablation of atrial fibrillation with additional ablation of discrete foci within the left atrium 5 Intracardiac echocardiography. 6. Transseptal puncture of an intact septum. 7. Rotational Angiography with processing at an independent workstation 8. Arrhythmia induction with pacing 9.External cardioversion.    Recent Labs: 09/28/2020: NT-Pro BNP 744 10/16/2020: Magnesium 2.0 02/14/2021: ALT <5; Hemoglobin 9.5; Platelets 190 03/18/2021: BUN 10; Creatinine, Ser 0.77; Potassium 3.7; Sodium 141  No results found for requested labs within last 8760 hours.   CrCl cannot be calculated (Patient's most recent lab result is older than the maximum 21 days allowed.).   Wt Readings from Last 3 Encounters:  04/08/21 135 lb 3.2 oz (61.3  kg)  02/14/21 138 lb (62.6 kg)   02/11/21 138 lb 9.6 oz (62.9 kg)     Other studies reviewed: Additional studies/records reviewed today include: summarized above  ASSESSMENT AND PLAN:  1. PPM     ***  2. Longstanding persistent AFib     CHA2DS2Vasc is 5, on Eliquis, appropriately dosed     *** % burden    3. Chronic CHF (diastolic)     No symptoms or exam findings to suggest volume OL     ***      4. HTN     ***   Disposition: ***    Current medicines are reviewed at length with the patient today.  The patient did not have any concerns regarding medicines.  Venetia Night, PA-C 04/12/2021 1:14 PM     [place]  [address] [phone] (office)  [phone] (fax)

## 2021-04-15 ENCOUNTER — Ambulatory Visit (INDEPENDENT_AMBULATORY_CARE_PROVIDER_SITE_OTHER): Payer: Medicare Other

## 2021-04-15 ENCOUNTER — Other Ambulatory Visit: Payer: Self-pay

## 2021-04-15 ENCOUNTER — Ambulatory Visit (INDEPENDENT_AMBULATORY_CARE_PROVIDER_SITE_OTHER): Payer: Medicare Other | Admitting: Neurology

## 2021-04-15 ENCOUNTER — Encounter: Payer: Self-pay | Admitting: Neurology

## 2021-04-15 VITALS — BP 126/79 | HR 72 | Ht 62.0 in | Wt 134.4 lb

## 2021-04-15 DIAGNOSIS — G20 Parkinson's disease: Secondary | ICD-10-CM | POA: Diagnosis not present

## 2021-04-15 DIAGNOSIS — I495 Sick sinus syndrome: Secondary | ICD-10-CM

## 2021-04-15 MED ORDER — CARBIDOPA-LEVODOPA 25-100 MG PO TABS: 2.0000 | ORAL_TABLET | Freq: Three times a day (TID) | ORAL | 1 refills | Status: DC

## 2021-04-15 NOTE — Patient Instructions (Addendum)
Increase carbidopa/levodopa 25/100, 2 tablets three times per day and move dosages to 8am/noon/4pm 2.  Continue carbidopa/levodopa 50/200 at bedtime   Local and Online Resources for Power over Parkinson's Group March 2023  LOCAL [place] PARKINSON'S GROUPS  Power over Parkinson's Group :   Power Over Parkinson's Patient Education Group will be Wednesday, March 8th-*Hybrid meting*- in person at [place] location and via [facility] at 2:00 pm.   Upcoming Power over Parkinson's Meetings:  2nd Wednesdays of the month at 2 pm:  March 8th, April 12th Contact Amy Marriott at [email or web address] if interested in participating in this group Parkinson's Care Partners Group:    3rd Mondays, Contact Misty Paladino Atypical Parkinsonian Patient Group:   4th Wednesdays, Contact Misty Paladino If you are interested in participating in these groups with Misty, please contact her directly for how to join those meetings.  Her contact information is [email or web address].    LOCAL EVENTS AND NEW OFFERINGS Parkinson's Wellness Event:  Pottery Night at [facility] [place].  Friday, March 10th 5:30-7:30 pm.  Sponsored by [employer] [place].  FREE event for people with Parkinson's and care partners.  RSVP to [facility] at [email or web address] Dine out at [facility].  Celebrate Parkinson's disease Awareness Month and Support the Parkinson's Movement Disorder Fund.   Wednesday, April 19th 4-6 pm at [place], [employer].  (Give receipt to cashier and 20% will be donated) Parkinson's T-shirts for sale!  Designed by a local group member, with funds going to [place].  $20.00  [place] to purchase (see email above) New PWR! Moves Class offering at [employer]!  Fridays 1-2 pm in March (likely to switch days and times after March).  Come try it out and see if PWR! Moves is a good fit for your exercise routine!  Contact Amy Marriott for details:   [email or web address] Hamil-[place] Challenge Bike, Run, Walk for PD, PSP, MSA.    Saturday, April 8th at 8 am.  [place]  Proceeds go to offset costs of exercise programs locally.  To Register, visit website:  [email or web address]  [place]:  www.[email or web address].org PD Health at Home continues:  Mindfulness Mondays, Expert Briefing Tuesdays, Wellness Wednesdays, Take Time Thursdays, Fitness Fridays  Upcoming Education:  Parkinson's and Medications:  [employer].  Wednesday, March 8th at 1:00 pm. Freezing and Fall Prevention in Parkinson's.  Wednesday, April 12th at 1:00 pm Register for expert briefings Cytogeneticist) at WatchCalls.si [place] Chapter Parkinson's Symposium: Cognition Changes- a free in-person ([place], [place]) and online [employer]) event for people with Parkinson's and their loved ones. During this event we will explore new treatments and practical strategies for addressing these changes.  Saturday, April 1st, 10 am-1 pm.  Register at [email or web address] or contact [place] at [phone] or [email or web address].  Please check out their website to sign up for emails and see their full online offerings  [place]:  [email or web address]  Third Thursday Webinars:  On the third Thursday of every month at 12 p.m. ET, join our free live webinars to learn about various aspects of living with Parkinson's disease and our work to speed medical breakthroughs. Upcoming Webinar:  The Power of Women in Philanthropy.  Tues, March 28th at  12 pm. Check out additional information on their website to see their full online offerings  [employer]:  www.[email or web address] Upcoming Webinar:   Stay tuned Webinar Series:  Living with Parkinson's Meetup.   Third Thursdays of each month at  3 pm Care Partner  Monthly Meetup.  With Robin Searing [place].  First Tuesday of each month, 2 pm Check out additional information to Live Well Today on their website  Parkinson and Movement Disorders (PMD) Alliance:  www.pmdalliance.org [employer]Life Online:  Online Education Events Sign up for emails, which are sent weekly to give you updates on programming and online offerings  Parkinson's Association of the [place]:  www.[email or web address] Information on online support groups, education events, and online exercises including Yoga, Parkinson's exercises and more-LOTS of information on links to PD resources and online events Virtual Support Group through Parkinson's Association of the [place]; next one is scheduled for Wednesday, *April 5th at 2 pm. *March meeting has been cancelled. (These are typically scheduled for the 1st Wednesday of the month at 2 pm).  Visit website for details. MOVEMENT AND EXERCISE OPPORTUNITIES Parkinson's DRUMMING Classes/Music Therapy with Doylene Canning:  This is a returning class and it's FREE!  2nd Mondays, continuing March 13th , 11:00 at the [place].  Contact *Misty Taylor-Paladino at [email or web address] or Doylene Canning at [phone] or [email or web address]  PWR! Moves Classes at [place].  Wednesdays 10 and 11 am.   NEW PWR! Moves Class offering at [employer].  Fridays 1-2 pm.  Contact Amy Marriott, PT [email or web address] if interested. Here is a link to the PWR!Moves classes on Zoom from [place] - Daily Mon-Sat at 10:00. Via Zoom, FREE and open to all.  There is also a link below via Facebook if you use that  platform. [email or web address].si [email or web address]  Parkinson's Wellness Recovery (PWR! Moves)  www.[email or web address] Info on the PWR! Virtual Experience:  You will have access to our expertise through self-assessment, guided plans that start with the PD-specific fundamentals, educational content, tips, Q&A with an expert, and a growing Art therapist of PD-specific pre-recorded and live exercise classes of varying types and intensity - both physical and cognitive! If that is not enough, we offer 1:1 wellness consultations (in-person or virtual) to personalize your PWR! Research scientist (medical).  Romeo Fridays:  As part of the PD Health @ Home program, this free video series focuses each week on one aspect of fitness designed to support people living with Parkinson's.  These weekly videos highlight the [place] recent fitness guidelines for people with Parkinson's disease. ModemGamers.si Dance for PD website is offering free, live-stream classes throughout the week, as well as links to [employer] of classes:  [email or web address]/ Dance for Parkinson's in-person class.  February 1-April 26, Wednesdays 4-5 pm.  Free class for people with Parkinson's disease, at [address] [place], [place].  Contact [phone] or [email or web address] to register Virtual dance and Pilates for Parkinson's classes: Click on the Community Tab> Parkinson's Movement Initiative Tab.  To register for classes and for more information, visit www.[email or web address] and click the community tab.  [employer] Parkinson's Cycling Classes  [employer]:  Thursdays @ Noon-Live classes at [employer] ([employer] at [email or web address] or  [phone]) [employer]: Virtual Classes Mondays and Thursdays Jeanette Caprice classes Tuesday, Wednesday and Thursday (contact [place] at [email or web address]  or [phone]) [place] Varied levels of classes are offered Mondays, Tuesdays and Thursdays at [employer].  Stretching with Verdis Frederickson weekly class is also offered for people with Parkinson's To observe a class or for more information, call [phone] or email Hezzie Bump at [email or web address] ADDITIONAL SUPPORT AND RESOURCES Well[employer]Spring [employer]:Online Caregiver Education Opportunities:  www.well-springsolutions.org/caregiver-education/caregiver-support-group.  You may also contact Vickki Muff  at [email or web address] or [phone].    Well-Spring Navigator:  [phone] program, a free service to help individuals and families through the journey of determining care for older adults.  The Navigator is a [employer], Education officer, museum, who will speak with a prospective client and/or loved ones to provide an assessment of the situation and a set of recommendations for a personalized care plan -- all free of charge, and whether Well[employer]Spring [employer] offers the needed service or not. If the need is not a service we provide, we are well-connected with reputable programs in town that we can refer you to.  www.well-springsolutions.org or to speak with the Navigator, call [phone]

## 2021-04-16 ENCOUNTER — Other Ambulatory Visit: Payer: Self-pay

## 2021-04-16 DIAGNOSIS — G20 Parkinson's disease: Secondary | ICD-10-CM

## 2021-04-16 LAB — CUP PACEART REMOTE DEVICE CHECK
Battery Remaining Longevity: 2 mo
Battery Voltage: 2.83 V
Brady Statistic AP VP Percent: 15.14 %
Brady Statistic AP VS Percent: 53.31 %
Brady Statistic AS VP Percent: 3.75 %
Brady Statistic AS VS Percent: 27.8 %
Brady Statistic RA Percent Paced: 65.69 %
Brady Statistic RV Percent Paced: 17.17 %
Date Time Interrogation Session: 20230313081515
Implantable Lead Implant Date: 20140917
Implantable Lead Implant Date: 20140917
Implantable Lead Location: 753859
Implantable Lead Location: 753860
Implantable Pulse Generator Implant Date: 20140917
Lead Channel Impedance Value: 285 Ohm
Lead Channel Impedance Value: 323 Ohm
Lead Channel Impedance Value: 323 Ohm
Lead Channel Impedance Value: 418 Ohm
Lead Channel Pacing Threshold Amplitude: 0.75 V
Lead Channel Pacing Threshold Amplitude: 0.875 V
Lead Channel Pacing Threshold Pulse Width: 0.4 ms
Lead Channel Pacing Threshold Pulse Width: 0.4 ms
Lead Channel Sensing Intrinsic Amplitude: 0.25 mV
Lead Channel Sensing Intrinsic Amplitude: 0.25 mV
Lead Channel Sensing Intrinsic Amplitude: 14 mV
Lead Channel Sensing Intrinsic Amplitude: 14 mV
Lead Channel Setting Pacing Amplitude: 2 V
Lead Channel Setting Pacing Amplitude: 2.5 V
Lead Channel Setting Pacing Pulse Width: 0.4 ms
Lead Channel Setting Sensing Sensitivity: 0.9 mV

## 2021-04-16 MED ORDER — RASAGILINE MESYLATE 1 MG PO TABS: 1.0000 mg | ORAL_TABLET | Freq: Every day | ORAL | 1 refills | Status: DC

## 2021-04-18 ENCOUNTER — Other Ambulatory Visit: Payer: Self-pay

## 2021-04-18 MED ORDER — RASAGILINE MESYLATE 1 MG PO TABS: 1.0000 mg | ORAL_TABLET | Freq: Every day | ORAL | 1 refills | Status: DC

## 2021-04-22 ENCOUNTER — Encounter: Payer: Self-pay | Admitting: Physician Assistant

## 2021-04-22 ENCOUNTER — Telehealth: Payer: Self-pay | Admitting: Physician Assistant

## 2021-04-22 ENCOUNTER — Ambulatory Visit (INDEPENDENT_AMBULATORY_CARE_PROVIDER_SITE_OTHER): Payer: Medicare Other | Admitting: Physician Assistant

## 2021-04-22 ENCOUNTER — Other Ambulatory Visit: Payer: Self-pay

## 2021-04-22 VITALS — BP 152/90 | HR 74 | Ht 62.0 in

## 2021-04-22 DIAGNOSIS — I4811 Longstanding persistent atrial fibrillation: Secondary | ICD-10-CM | POA: Diagnosis not present

## 2021-04-22 DIAGNOSIS — I5032 Chronic diastolic (congestive) heart failure: Secondary | ICD-10-CM | POA: Diagnosis not present

## 2021-04-22 DIAGNOSIS — Z950 Presence of cardiac pacemaker: Secondary | ICD-10-CM

## 2021-04-22 DIAGNOSIS — Z79899 Other long term (current) drug therapy: Secondary | ICD-10-CM

## 2021-04-22 DIAGNOSIS — R0600 Dyspnea, unspecified: Secondary | ICD-10-CM | POA: Diagnosis not present

## 2021-04-22 LAB — COMPREHENSIVE METABOLIC PANEL
ALT: 6 IU/L (ref 0–32)
AST: 29 IU/L (ref 0–40)
Albumin/Globulin Ratio: 1.5 (ref 1.2–2.2)
Albumin: 4.3 g/dL (ref 3.7–4.7)
Alkaline Phosphatase: 75 IU/L (ref 44–121)
BUN/Creatinine Ratio: 13 (ref 12–28)
BUN: 11 mg/dL (ref 8–27)
Bilirubin Total: 1.1 mg/dL (ref 0.0–1.2)
CO2: 31 mmol/L — ABNORMAL HIGH (ref 20–29)
Calcium: 10 mg/dL (ref 8.7–10.3)
Chloride: 97 mmol/L (ref 96–106)
Creatinine, Ser: 0.86 mg/dL (ref 0.57–1.00)
Globulin, Total: 2.8 g/dL (ref 1.5–4.5)
Glucose: 93 mg/dL (ref 70–99)
Potassium: 4.2 mmol/L (ref 3.5–5.2)
Sodium: 138 mmol/L (ref 134–144)
Total Protein: 7.1 g/dL (ref 6.0–8.5)
eGFR: 69 mL/min/{1.73_m2} (ref >59)

## 2021-04-22 LAB — CBC
Hematocrit: 29.4 % — ABNORMAL LOW (ref 34.0–46.6)
Hemoglobin: 9.7 g/dL — ABNORMAL LOW (ref 11.1–15.9)
MCH: 27.8 pg (ref 26.6–33.0)
MCHC: 33 g/dL (ref 31.5–35.7)
MCV: 84 fL (ref 79–97)
Platelets: 207 10*3/uL (ref 150–450)
RBC: 3.49 x10E6/uL — ABNORMAL LOW (ref 3.77–5.28)
RDW: 14.6 % (ref 11.7–15.4)
WBC: 5.5 10*3/uL (ref 3.4–10.8)

## 2021-04-22 NOTE — Patient Instructions (Signed)
Medication Instructions:  ? ?Your physician recommends that you continue on your current medications as directed. Please refer to the Current Medication list given to you today. ? ?PLEASE CONTACT CLINIC BACK WITH CORRECT DOSE OF FUROSEMIDE(LASIX)  ? ?*If you need a refill on your cardiac medications before your next appointment, please call your pharmacy* ? ? ?Lab Work: CMET AND CBC TODAY  ? ?If you have labs (blood work) drawn today and your tests are completely normal, you will receive your results only by: ?MyChart Message (if you have MyChart) OR ?A paper copy in the mail ?If you have any lab test that is abnormal or we need to change your treatment, we will call you to review the results. ? ? ?Testing/Procedures: Your physician has requested that you have an echocardiogram. Echocardiography is a painless test that uses sound waves to create images of your heart. It provides your doctor with information about the size and shape of your heart and how well your heart?s chambers and valves are working. This procedure takes approximately one hour. There are no restrictions for this procedure. ? ? ?Follow-Up: ?At [facility], you and your health needs are our priority.  As part of our continuing mission to provide you with exceptional heart care, we have created designated Provider Care Teams.  These Care Teams include your primary Cardiologist (physician) and Advanced Practice Providers (APPs -  Physician Assistants and Nurse Practitioners) who all work together to provide you with the care you need, when you need it. ? ?We recommend signing up for the patient portal called "MyChart".  Sign up information is provided on this After Visit Summary.  MyChart is used to connect with patients for Virtual Visits (Telemedicine).  Patients are able to view lab/test results, encounter notes, upcoming appointments, etc.  Non-urgent messages can be sent to your provider as well.   ?To learn more about what you can do with  MyChart, go to [email or web address].   ? ?Your next appointment:   ? ?1 month(s) ( CONTACT [facility] FOR EP SCHEDULING ISSUES ) ? ? ?The format for your next appointment:   ?In Person ? ?Provider:   ?You may see Thompson Grayer, MD or one of the following Advanced Practice Providers on your designated Care Team:   ?Tommye Standard, PA-C ?Legrand Como "Jonni Sanger" [place], PA-C  ? ?Other Instructions ? ?

## 2021-04-22 NOTE — Telephone Encounter (Signed)
Pt c/o medication issue: ? ?1. Name of Medication:  ?furosemide (LASIX) 40 MG tablet ?Magnesium Oxide 120 MG TABS ?potassium chloride (KLOR-CON M) 10 MEQ tablet ? ?2. How are you currently taking this medication (dosage and times per day)?  ?Furosemide 1 and a half tablets daily ?Magnesium 2 tablets daily ?Potassium 1 tablet daily ? ?3. Are you having a reaction (difficulty breathing--STAT)? no ? ?4. What is your medication issue? Patient calling to verify her medication. She says for the furosemide she was taking 1 tablet daily, but January received a prescription for 1 and a half tablets daily. The Magnesium she says she is taking two 120 mg tablets daily. She says the potassium she was taking 2 tablets twice a day when her potassium was low, but now is taking 1 daily.  ? ? ?

## 2021-04-23 MED ORDER — FUROSEMIDE 40 MG PO TABS: 60.0000 mg | ORAL_TABLET | Freq: Every day | ORAL | 1 refills | Status: DC

## 2021-04-23 MED ORDER — POTASSIUM CHLORIDE CRYS ER 10 MEQ PO TBCR: 10.0000 meq | EXTENDED_RELEASE_TABLET | Freq: Every day | ORAL | 1 refills | Status: DC

## 2021-04-29 ENCOUNTER — Ambulatory Visit (HOSPITAL_COMMUNITY): Payer: Medicare Other | Attending: Cardiology

## 2021-04-29 ENCOUNTER — Other Ambulatory Visit: Payer: Self-pay

## 2021-04-29 DIAGNOSIS — R0600 Dyspnea, unspecified: Secondary | ICD-10-CM | POA: Insufficient documentation

## 2021-04-29 DIAGNOSIS — R0609 Other forms of dyspnea: Secondary | ICD-10-CM | POA: Diagnosis not present

## 2021-04-29 LAB — ECHOCARDIOGRAM COMPLETE: S' Lateral: 2.4 cm

## 2021-04-29 NOTE — Addendum Note (Signed)
Addended by: Cheri Kearns A on: 04/29/2021 03:43 PM ? ? Modules accepted: Level of Service ? ?

## 2021-04-29 NOTE — Progress Notes (Signed)
Remote pacemaker transmission.   

## 2021-04-30 DIAGNOSIS — S62626A Displaced fracture of medial phalanx of right little finger, initial encounter for closed fracture: Secondary | ICD-10-CM | POA: Diagnosis not present

## 2021-04-30 DIAGNOSIS — Z4789 Encounter for other orthopedic aftercare: Secondary | ICD-10-CM | POA: Diagnosis not present

## 2021-05-01 ENCOUNTER — Other Ambulatory Visit: Payer: Self-pay | Admitting: *Deleted

## 2021-05-01 DIAGNOSIS — I272 Pulmonary hypertension, unspecified: Secondary | ICD-10-CM

## 2021-05-03 DIAGNOSIS — E785 Hyperlipidemia, unspecified: Secondary | ICD-10-CM | POA: Diagnosis not present

## 2021-05-03 DIAGNOSIS — I10 Essential (primary) hypertension: Secondary | ICD-10-CM | POA: Diagnosis not present

## 2021-05-03 DIAGNOSIS — I480 Paroxysmal atrial fibrillation: Secondary | ICD-10-CM | POA: Diagnosis not present

## 2021-05-03 DIAGNOSIS — M810 Age-related osteoporosis without current pathological fracture: Secondary | ICD-10-CM | POA: Diagnosis not present

## 2021-05-06 DIAGNOSIS — Z961 Presence of intraocular lens: Secondary | ICD-10-CM | POA: Diagnosis not present

## 2021-05-06 DIAGNOSIS — H353213 Exudative age-related macular degeneration, right eye, with inactive scar: Secondary | ICD-10-CM | POA: Diagnosis not present

## 2021-05-08 ENCOUNTER — Ambulatory Visit (INDEPENDENT_AMBULATORY_CARE_PROVIDER_SITE_OTHER): Payer: Medicare Other | Admitting: Ophthalmology

## 2021-05-08 ENCOUNTER — Encounter (INDEPENDENT_AMBULATORY_CARE_PROVIDER_SITE_OTHER): Payer: Self-pay | Admitting: Ophthalmology

## 2021-05-08 ENCOUNTER — Other Ambulatory Visit: Payer: Self-pay

## 2021-05-08 DIAGNOSIS — H35721 Serous detachment of retinal pigment epithelium, right eye: Secondary | ICD-10-CM

## 2021-05-08 DIAGNOSIS — H353211 Exudative age-related macular degeneration, right eye, with active choroidal neovascularization: Secondary | ICD-10-CM

## 2021-05-08 DIAGNOSIS — H353132 Nonexudative age-related macular degeneration, bilateral, intermediate dry stage: Secondary | ICD-10-CM | POA: Diagnosis not present

## 2021-05-08 MED ORDER — BEVACIZUMAB 2.5 MG/0.1ML IZ SOSY
2.5000 mg | PREFILLED_SYRINGE | INTRAVITREAL | Status: AC | PRN
  Administered 2021-05-08: 2.5 mg via INTRAVITREAL

## 2021-05-08 NOTE — Assessment & Plan Note (Signed)
OD with wet AMD, vascularized subfoveal PED. ?

## 2021-05-08 NOTE — Progress Notes (Signed)
? ? ?05/08/2021 ? ?  ? ?CHIEF COMPLAINT ?Patient presents for  ?Chief Complaint  ?Patient presents with  ? Retina Evaluation  ? ? ? ? ?HISTORY OF PRESENT ILLNESS: ?[name] [name] is a 81 y.o. female who presents to the clinic today for:  ? ?HPI   ? ? Retina Evaluation   ? ?      ? Laterality: right eye  ? Onset: months ago  ? Associated Symptoms: Distortion (wavy) and Photophobia.  Negative for Flashes, Floaters and Blind Spot  ? ?  ?  ? ? Comments   ?NP- AMD Vision Changes OD, Referred by Dr Prudencio Burly. ?Pt states "I did the eye chart but I couldn't see with my right eye." Denies FOL or floaters. ?Pt sates she is seeing wavy vision. ? ?  ?  ?Last edited by [name] on 05/08/2021  9:02 AM.  ?  ? ? ?Referring physician: ?Katy Apo, MD ?[address] ?[place],  [place] [id number] ? ?HISTORICAL INFORMATION:  ? ?Selected notes from the [facility] ?  ? ?Lab Results  ?Component Value Date  ? HGBA1C  05/21/2007  ?  5.6 ?(NOTE)   The ADA recommends the following therapeutic goals for glycemic   control related to Hgb A1C measurement:   Goal of Therapy:   < 7.0% Hgb A1C   Action Suggested:  > 8.0% Hgb A1C   Ref:  Diabetes Care, 22, Suppl. 1, 1999  ?  ? ?CURRENT MEDICATIONS: ?No current outpatient medications on file. (Ophthalmic Drugs)  ? ?No current facility-administered medications for this visit. (Ophthalmic Drugs)  ? ?Current Outpatient Medications (Other)  ?Medication Sig  ? CALCIUM PO Take 1 tablet by mouth at bedtime.  ? Calcium Polycarbophil (FIBERCON PO) Take 2 tablets by mouth every morning.  ? carbidopa-levodopa (SINEMET IR) 25-100 MG tablet Take 2 tablets by mouth 3 (three) times daily.  ? cyanocobalamin (,VITAMIN B-12,) 1000 MCG/ML injection INJECT 1ML INTRAMUSCULARLY IN THE DELTOID ONCE A MONTH (ALTERNATING DELT. EACH MONTH)  ? cyanocobalamin 100 MCG tablet Take 100 mcg by mouth daily.  ? Docusate Sodium (COLACE PO) Take 1 capsule by mouth 2 (two) times daily as needed (constipation).  ? ELIQUIS  5 MG TABS tablet TAKE 1 TABLET BY MOUTH TWICE A DAY  ? ezetimibe (ZETIA) 10 MG tablet Take 10 mg by mouth every other day. Taking the same day as the crestor  ? furosemide (LASIX) 40 MG tablet Take 1.5 tablets (60 mg total) by mouth daily.  ? levothyroxine (SYNTHROID) 50 MCG tablet Take 50 mcg by mouth daily. Pt take 1 1/2 tablet on Monday and Thursday and 1 tablet the rest of the week  ? metoprolol succinate (TOPROL-XL) 25 MG 24 hr tablet TAKE 1 TABLET BY MOUTH DAILY.  ? multivitamin-lutein (OCUVITE-LUTEIN) CAPS capsule Take 1 capsule by mouth daily.  ? NON FORMULARY Liposomal Glutathione take 1 table qd  ? potassium chloride (KLOR-CON M) 10 MEQ tablet Take 1 tablet (10 mEq total) by mouth daily.  ? Probiotic Product (PROBIOTIC-10 PO) Take 1 tablet by mouth daily. Lexine Baton  ? rasagiline (AZILECT) 1 MG TABS tablet Take 1 tablet (1 mg total) by mouth daily.  ? rosuvastatin (CRESTOR) 5 MG tablet Take 2.5 mg by mouth every other day.  ? valACYclovir (VALTREX) 1000 MG tablet Take 1,000 mg by mouth 2 (two) times daily as needed (fever blisters).  ? VITAMIN D, ERGOCALCIFEROL, PO Take 1,000 Units by mouth daily.   ? ?No current facility-administered  medications for this visit. (Other)  ? ? ? ? ?REVIEW OF SYSTEMS: ?ROS   ?Negative for: Constitutional, Gastrointestinal, Neurological, Skin, Genitourinary, Musculoskeletal, HENT, Endocrine, Cardiovascular, Eyes, Respiratory, Psychiatric, Allergic/Imm, Heme/Lymph ?Last edited by Hurman Horn, MD on 05/08/2021  9:46 AM.  ?  ? ? ? ?ALLERGIES ?Allergies  ?Allergen Reactions  ? Amiodarone Other (See Comments)  ? Bisphosphonates Other (See Comments)  ? Statins   ?  Leg weakness - but tolerating low dose Crestor every other day  ? ? ?PAST MEDICAL HISTORY ?Past Medical History:  ?Diagnosis Date  ? Anemia   ? Arthritis   ? thumbs  ? Atrial tachycardia ([place])   ? 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 Cataract   ? bilateral  ? Chronic anticoagulation   ? on Xarelto  ? Chronic diastolic CHF  (congestive heart failure) ([place])   ? Echocardiogram (11/24/12): Mild LVH, EF 60%.  ? Dysrhythmia   ? GERD (gastroesophageal reflux disease)   ? hx of no meds  ? Heart murmur   ? slight  ? Hyperlipidemia   ? a. Hx of leg weakness while on statin. under control  ? Hypertension   ? Hypothyroidism   ? Leg fracture Dec 21,2011  ? Osteoporosis 07/2007  ? Parkinson's disease ([place]) 2011  ? Persistent atrial fibrillation (HCC)   ? s/p atrial fib ablation 11-11-11.   ? PONV (postoperative nausea and vomiting)   ? Presence of permanent cardiac pacemaker   ? permanent Medtronic  ? Shortness of breath   ? "when walking at incline or stairs"  ? 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 Wrist fracture   ? right  ? ?Past Surgical History:  ?Procedure Laterality Date  ? ATRIAL FIBRILLATION ABLATION N/A 11/11/2011  ? Procedure: ATRIAL FIBRILLATION ABLATION;  Surgeon: Thompson Grayer, MD;  Location: [facility] CATH LAB;  Service: Cardiovascular;  Laterality: N/A;  ? BREAST BIOPSY  1998  ? BREAST SURGERY    ? benign cyst removed  ? CARDIAC ELECTROPHYSIOLOGY STUDY AND ABLATION  5/14, 7/14  ? convergent AF ablation at [facility] and subsequent atypical atrial flutter ablation by Dr Lehman Prom  ? CARDIOVASCULAR STRESS TEST  06-06-2008  ? EF 73%  ? CARDIOVERSION  09/09/2011  ? Procedure: CARDIOVERSION;  Surgeon: Darlin Coco, MD;  Location: [facility] ENDOSCOPY;  Service: Cardiovascular;  Laterality: N/A;  to be done by dr. Mare Ferrari  ? CARDIOVERSION  02/18/2012  ? Procedure: CARDIOVERSION;  Surgeon: Thayer Headings, MD;  Location: [facility];  Service: Cardiovascular;  Laterality: N/A;  ? CHOLECYSTECTOMY  1995  ? FEMUR FRACTURE SURGERY  2011  ? metal pin in place  ? FOREARM SURGERY Left 2010  ? "opened arm to drain it"  ? INSERTION OF MESH N/A 11/24/2013  ? Procedure: INSERTION OF MESH;  Surgeon: Michael Boston, MD;  Location: [facility] ORS;  Service: General;  Laterality: N/A;  ? KNEE SURGERY Left 2001  ? Left unicompartmental  knee    ? Dr. Alvan Dame 11/18/16  ? PACEMAKER PLACEMENT  10/20/12  ? MDT [place]a DR implanted by Dr Lehman Prom at [facility]   ? PARTIAL KNEE ARTHROPLASTY Left 11/18/2016  ? Procedure: LEFT UNICOMPARTMENTAL KNEE Medially;  Surgeon: Paralee Cancel, MD;  Location: [facility] ORS;  Service: Orthopedics;  Laterality: Left;  90 mins  ? SKIN CANCER DESTRUCTION  summer 2015  ? TEE WITHOUT CARDIOVERSION  11/10/2011  ? Procedure: TRANSESOPHAGEAL ECHOCARDIOGRAM (TEE);  Surgeon: Thayer Headings, MD;  Location: [facility] ENDOSCOPY;  Service: Cardiovascular;  Laterality: N/A;  ? TONSILLECTOMY  as child  ? US ECHOCARDIOGRAPHY  03-10-2008  ? Est EF 55-60%, Dr. Cathie Olden  ? VENTRAL HERNIA REPAIR N/A 11/24/2013  ? Procedure: LAPAROSCOPIC VENTRAL HERNIA;  Surgeon: Michael Boston, MD;  Location: [facility] ORS;  Service: General;  Laterality: N/A;  ? WRIST SURGERY Right ~2009  ? metal rod in place  ? ? ?FAMILY HISTORY ?Family History  ?Problem Relation Age of Onset  ? Alzheimer's disease Mother   ? Heart failure Father   ? Healthy Child   ? Healthy Child   ? Breast cancer Maternal Aunt   ? ? ?SOCIAL HISTORY ?Social History  ? ?Tobacco Use  ? Smoking status: Never  ? Smokeless tobacco: Never  ?Vaping Use  ? Vaping Use: Never used  ?Substance Use Topics  ? Alcohol use: No  ? Drug use: No  ? ?  ? ?  ? ?OPHTHALMIC EXAM: ? ?Base Eye Exam   ? ? Visual Acuity (ETDRS)   ? ?   Right Left  ? Dist cc 20/60 -2 20/25 -1  ? Dist ph cc NI   ? ? Correction: Glasses  ? ?  ?  ? ? Tonometry (Tonopen, 9:07 AM)   ? ?   Right Left  ? Pressure 12 11  ? ?  ?  ? ? Pupils   ? ?   Pupils Dark Light APD  ? Right PERRL 4 3 None  ? Left PERRL 4 3 None  ? ?  ?  ? ? Visual Fields (Counting fingers)   ? ?   Left Right  ?  Full Full  ? ?  ?  ? ? Extraocular Movement   ? ?   Right Left  ?  Full Full  ? ?  ?  ? ? Neuro/Psych   ? ? Oriented x3: Yes  ? Mood/Affect: Normal  ? ?  ?  ? ? Dilation   ? ? Both eyes: 1.0% Mydriacyl, 2.5% Phenylephrine @ 9:07 AM  ? ?  ?  ? ?  ? ?Slit Lamp and Fundus Exam   ? ? External Exam    ? ?   Right Left  ? External Normal Normal  ? ?  ?  ? ? Slit Lamp Exam   ? ?   Right Left  ? Lids/Lashes Normal Normal  ? Conjunctiva/Sclera White and quiet White and quiet  ? Cornea Clear Clear  ? A

## 2021-05-08 NOTE — Assessment & Plan Note (Signed)
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  Forms of therapy reviewed include the use of Anti-VEGF medications injected painlessly into the eye, as well as other possible treatment modalities, including thermal laser therapy. Fellow eye involvement and risks were discussed with the patient. 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 The treatment regimen is on a treat as needed basis with the intent to treat if necessary and extend interval of exams when possible. On average 1 out of 6 patients do not need lifetime therapy. However, the risk of recurrent disease is high for a lifetime.  Initially monthly, then periodic, examinations and evaluations will determine whether the next treatment is required on the day of the examination. ? ?Petra Kuba of treatment reviewed with patient and family including the use of antivegF medications to limit progression and to offer resolution of the CNVM of her I explained that the visual acuity is uncertain as to the ultimate outcome due to the location of the disease currently present in the subfoveal location.  Nonetheless on balance intravitreal injection of medications offers the only chance for stabilization long-term and potential for improvement of vision ?

## 2021-05-14 ENCOUNTER — Telehealth: Payer: Self-pay | Admitting: Physician Assistant

## 2021-05-14 NOTE — Telephone Encounter (Signed)
Pt states that she have yet to hear from the Heart Specialist to schedule an appt. Please advise ?

## 2021-05-15 NOTE — Telephone Encounter (Signed)
Appt sch for 5/9 with Dr Aundra Dubin ?

## 2021-05-21 NOTE — Progress Notes (Signed)
? ?Cardiology Office Note ?Date:  05/21/2021  ?Patient ID:  [name] [name], DOB 1941-01-06, MRN [id number] ?PCP:  Crist Infante, MD  ?Electrophysiologist:  Dr. Ky Barban ? ?  ?Chief Complaint:    planned f/u ? ?History of Present Illness: ?[name] [name] is a 81 y.o. female with history of HTN, HLD, Parkinson's disease  (Dr. 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 Osker Mason), chronic CHF (diastolic) ? ?I saw her 05/26/19 ?She is doing well.  She is not a steady on her feet with the Parkinon's, though no stumbles, falls.  She denies any CP, occassionally has an awareness of her heart beat, but not fast, irregular.  No rest SOB, she sleeps weill with no symptoms or PND or orthopnea.  She has some baseline DOE that is unchanged. ?She was having PAFib by her EKGs/remotes pacer programmed MVP ? ?She saw Dr. Rayann Heman in f/u Sept 2021 feeling well though mentioned some SOB, AF burden only 4%, was planned for stress and echo and if low risk could be cleared for EGD she was pending ? ?Oct 2021: TTE with LVEF 55-60%, RVSP 52.61mHg, stress as low risk, no ischemia/infarct ? ?More recently she saw A. Tillery PA-C Sept 2022, SOB was improved though not completely resolved with adjustment in her lasix, no changes were made, she was cleared for colonoscopy ? ?Pre-op pool and RPH have commented and cleared  ? ?I saw her 12/31/20 ?She is doing "OK I guess", she is getting the colonoscopy 2/2 heme + stools, denies obvious bleeding or bleeding otherwise. ?She confirms that she has been said to be anemic for quite a long time, that is not new for her. ?She denies any CP, palpitations or cardiac awareness. ?She does say though that while generally for years she does not have much energy in the last month of so she will have an hour here and there that she feels somewhat suddenly really tired.  No t weak, not lightheaded, just  tired and then for no clear reason feels better/back to her baseline. ?No clear trigger, rhyme/reason to it. ?No fever, illness ?No changes to meds ?PMD does labs ? ?AF burden was up, though she also reported increased personal stress, was being evaluated for anemia, pending GI/scope ?Home transmitter not working, she was nearing ERI. ?Planned for her to f/u with device clinic once home to trouble shoot her transmitter and have early follow up to monitor AF burden and ensure home monitor function with nearing ERI ? ?+ remotes, AF burden last <0.1% ?SOB, fatigue, ? anemia ? ?I saw her 02/11/21 ?She is doing well ?NO bleeding or signs of bleeding ?GI w/u was good, no bleeding found, pending further d/w her PMD. ?She feels dehydrated, mouth is dry and would like to reduce her lasix dose. ?She feels like generally she is feeling OK ?No CP or palpitations ?Denies any ongoing SOB, and probably a bit better with energy, but doesn't tend to sleep well. ?No near syncope or syncope ?Eyes are itchy and red a couple days, no pain, no drainage, thought was allergy but plans to see her PMD or eye doctor ?Planned to reduce her lasix/K+, follow for volume OL at home, f/u labs planned ?Monthly battery checks nearing ERI ? ?02/14/21: ER visit, dizziness, nonspecific , not feeling right,  had taken extra laxative for constipation and a new supplement ?CT was negative, labs only notable for hypokalemia (3.4) suspect 2/2 laxative  use and increased BM, replaced orally and given IVF ?EKG reported as an accelerated junctional rhythm, 73bpm ? ?I saw her 04/22/21 ?She is accompanied by her husband today ?He mentions that intermittently she sleep very poorly and when that happens she had very little daytime energy. ?Given as example that when she is sleeping well they walk on trails, she can do inclines, and feels well.   ?She has not slept now in 2 days straight and her energy is very poor, doesn't want to do anything, she also during these  times c/o stomach problems. ?She denies N/V or pain, just something in her belly, locate low abdomen. ?This is apparently not new, waxes and wanes, and by his observation always associated with times of her insomnia ?She denies symptoms, just can not fall asleep, mind races. ?She had a fall a few weeks ago and broke her R 5th finger, apparently a crush injury saw orthopedics, also felt to have gotten infected and comleted antibiotocs, though remains swollen and red. The fall was a loss of balance, turned too quickly and fell.  No syncope ?No palpitations ?No SOB, again in fact she reports feeling dehydrated, very dry mouth. ?Unclear CP, she thinks is indigestion with a known hernia, is random, rare, not associated wth position or exertion, not new. ?No bleeding or signs of bleeding ? ?Advised to f/u with PMD regarding her insomnia/fatigue, her PMD or ortho for her finger which was still painful and erythematous ?Generator was near ERI ?She was very worried that her fatigue was something serious and planned for labs, echo ? ?Labs were stable, anemia unchanged from last labs. ?Echo noted preserved LVEF , very high pulm pressures and planned for AHF clinic referral. ? ?Appt with Dr. Aundra Dubin in May ? ?TODAY ?She is accompanied by her husband ?Sleep waxes and wanes, of late has been able to get better sleep and when she does, feels much better ?Her finger is better.  Still swollen, not red, not painful ?Last night she had an episode of CP that she suspected was heart burn/esophagus, center chest from the base of her throat to her epigastrum, no associated symptoms, did not change with position or exertion, eased off and was gone in about 8 minutes or so.  She went to dinner and felt well, has not had it again. ? ?No dizziness, near syncope or syncope ?No bleeding or signs of bleeding ? ? ? ?Device information ?MDT dual chamber PPM implanted 10/20/2012 ? ?AFib Hx ?Diagnosed 2006 ?PVI ablation 11/11/2011, Dr. Rayann Heman ?AAD  HX ?Flecainide> amiodarone >> rate control strategy ? ? ?Past Medical History:  ?Diagnosis Date  ? Anemia   ? Arthritis   ? thumbs  ? Atrial tachycardia ([place])   ? 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 Cataract   ? bilateral  ? Chronic anticoagulation   ? on Xarelto  ? Chronic diastolic CHF (congestive heart failure) ([place])   ? Echocardiogram (11/24/12): Mild LVH, EF 60%.  ? Dysrhythmia   ? GERD (gastroesophageal reflux disease)   ? hx of no meds  ? Heart murmur   ? slight  ? Hyperlipidemia   ? a. Hx of leg weakness while on statin. under control  ? Hypertension   ? Hypothyroidism   ? Leg fracture Dec 21,2011  ? Osteoporosis 07/2007  ? Parkinson's disease ([place]) 2011  ? Persistent atrial fibrillation (HCC)   ? s/p atrial fib ablation 11-11-11.   ? PONV (postoperative nausea and vomiting)   ? Presence  of permanent cardiac pacemaker   ? permanent Medtronic  ? Shortness of breath   ? "when walking at incline or stairs"  ? 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 Wrist fracture   ? right  ? ? ?Past Surgical History:  ?Procedure Laterality Date  ? ATRIAL FIBRILLATION ABLATION N/A 11/11/2011  ? Procedure: ATRIAL FIBRILLATION ABLATION;  Surgeon: Thompson Grayer, MD;  Location: [facility] CATH LAB;  Service: Cardiovascular;  Laterality: N/A;  ? BREAST BIOPSY  1998  ? BREAST SURGERY    ? benign cyst removed  ? CARDIAC ELECTROPHYSIOLOGY STUDY AND ABLATION  5/14, 7/14  ? convergent AF ablation at [facility] and subsequent atypical atrial flutter ablation by Dr Lehman Prom  ? CARDIOVASCULAR STRESS TEST  06-06-2008  ? EF 73%  ? CARDIOVERSION  09/09/2011  ? Procedure: CARDIOVERSION;  Surgeon: Darlin Coco, MD;  Location: [facility] ENDOSCOPY;  Service: Cardiovascular;  Laterality: N/A;  to be done by dr. Mare Ferrari  ? CARDIOVERSION  02/18/2012  ? Procedure: CARDIOVERSION;  Surgeon: Thayer Headings, MD;  Location: [place];  Service: Cardiovascular;  Laterality: N/A;  ? CHOLECYSTECTOMY  1995  ? FEMUR FRACTURE SURGERY   2011  ? metal pin in place  ? FOREARM SURGERY Left 2010  ? "opened arm to drain it"  ? INSERTION OF MESH N/A 11/24/2013  ? Procedure: INSERTION OF MESH;  Surgeon: Michael Boston, MD;  Location: [facility] ORS;  Service:

## 2021-05-23 ENCOUNTER — Ambulatory Visit (INDEPENDENT_AMBULATORY_CARE_PROVIDER_SITE_OTHER): Payer: Medicare Other | Admitting: Physician Assistant

## 2021-05-23 ENCOUNTER — Encounter: Payer: Self-pay | Admitting: Physician Assistant

## 2021-05-23 ENCOUNTER — Encounter: Payer: Self-pay | Admitting: *Deleted

## 2021-05-23 ENCOUNTER — Other Ambulatory Visit: Payer: Medicare Other

## 2021-05-23 VITALS — BP 120/70 | HR 90 | Ht 62.0 in | Wt 131.2 lb

## 2021-05-23 DIAGNOSIS — I480 Paroxysmal atrial fibrillation: Secondary | ICD-10-CM | POA: Diagnosis not present

## 2021-05-23 DIAGNOSIS — I10 Essential (primary) hypertension: Secondary | ICD-10-CM | POA: Diagnosis not present

## 2021-05-23 DIAGNOSIS — Z45010 Encounter for checking and testing of cardiac pacemaker pulse generator [battery]: Secondary | ICD-10-CM | POA: Diagnosis not present

## 2021-05-23 DIAGNOSIS — Z01818 Encounter for other preprocedural examination: Secondary | ICD-10-CM

## 2021-05-23 DIAGNOSIS — R079 Chest pain, unspecified: Secondary | ICD-10-CM | POA: Diagnosis not present

## 2021-05-23 DIAGNOSIS — I272 Pulmonary hypertension, unspecified: Secondary | ICD-10-CM | POA: Diagnosis not present

## 2021-05-23 DIAGNOSIS — I5032 Chronic diastolic (congestive) heart failure: Secondary | ICD-10-CM | POA: Diagnosis not present

## 2021-05-23 LAB — CUP PACEART INCLINIC DEVICE CHECK
Battery Remaining Longevity: 1 mo
Battery Voltage: 2.83 V
Brady Statistic AP VP Percent: 19.75 %
Brady Statistic AP VS Percent: 52.52 %
Brady Statistic AS VP Percent: 4.61 %
Brady Statistic AS VS Percent: 23.12 %
Brady Statistic RA Percent Paced: 70.11 %
Brady Statistic RV Percent Paced: 22.97 %
Date Time Interrogation Session: 20230420124741
Implantable Lead Implant Date: 20140917
Implantable Lead Implant Date: 20140917
Implantable Lead Location: 753859
Implantable Lead Location: 753860
Implantable Pulse Generator Implant Date: 20140917
Lead Channel Impedance Value: 323 Ohm
Lead Channel Impedance Value: 361 Ohm
Lead Channel Impedance Value: 361 Ohm
Lead Channel Impedance Value: 456 Ohm
Lead Channel Pacing Threshold Amplitude: 0.75 V
Lead Channel Pacing Threshold Amplitude: 0.875 V
Lead Channel Pacing Threshold Pulse Width: 0.4 ms
Lead Channel Pacing Threshold Pulse Width: 0.4 ms
Lead Channel Sensing Intrinsic Amplitude: 0.375 mV
Lead Channel Sensing Intrinsic Amplitude: 0.5 mV
Lead Channel Sensing Intrinsic Amplitude: 12.375 mV
Lead Channel Sensing Intrinsic Amplitude: 15.25 mV
Lead Channel Setting Pacing Amplitude: 2 V
Lead Channel Setting Pacing Amplitude: 2.5 V
Lead Channel Setting Pacing Pulse Width: 0.4 ms
Lead Channel Setting Sensing Sensitivity: 0.9 mV

## 2021-05-23 NOTE — Patient Instructions (Addendum)
Medication Instructions:  ? ?Your physician recommends that you continue on your current medications as directed. Please refer to the Current Medication list given to you today. ? ?MAKE SURE TO FOLLOW MEDICATION INSTRUCTIONS ON PROCEDURE LETTER  ? ?*If you need a refill on your cardiac medications before your next appointment, please call your pharmacy* ? ? ?Lab Work:  r RETURN FOR LABS BMET AND CBC TODAY  ? ? ?If you have labs (blood work) drawn today and your tests are completely normal, you will receive your results only by: ?MyChart Message (if you have MyChart) OR ?A paper copy in the mail ?If you have any lab test that is abnormal or we need to change your treatment, we will call you to review the results. ? ? ?Testing/Procedures: SEE LETTER  ATTACHED FOR BATTERY CHANGE OUT  WITH DR CAMNITZ ON 06-05-2021 ? ? ? ?Follow-Up: ?At [facility], you and your health needs are our priority.  As part of our continuing mission to provide you with exceptional heart care, we have created designated Provider Care Teams.  These Care Teams include your primary Cardiologist (physician) and Advanced Practice Providers (APPs -  Physician Assistants and Nurse Practitioners) who all work together to provide you with the care you need, when you need it. ? ?We recommend signing up for the patient portal called "MyChart".  Sign up information is provided on this After Visit Summary.  MyChart is used to connect with patients for Virtual Visits (Telemedicine).  Patients are able to view lab/test results, encounter notes, upcoming appointments, etc.  Non-urgent messages can be sent to your provider as well.   ?To learn more about what you can do with MyChart, go to [email or web address].   ? ?Your next appointment:  AFTER  06-05-2021  10-14 DAYS WOUND CHECK WITH DEVICE CLINIC ? ?3 month(s) ? ?The format for your next appointment:   ?In Person ? ?Provider:   ?Allegra Lai, MD{ ? ? ? ?Other Instructions ? ? ?Important Information About  [place] ? ? ? ? ?  ?

## 2021-05-23 NOTE — Progress Notes (Unsigned)
LETTER.

## 2021-05-24 ENCOUNTER — Telehealth: Payer: Self-pay | Admitting: *Deleted

## 2021-05-24 LAB — BASIC METABOLIC PANEL
BUN/Creatinine Ratio: 21 (ref 12–28)
BUN: 19 mg/dL (ref 8–27)
CO2: 28 mmol/L (ref 20–29)
Calcium: 9.4 mg/dL (ref 8.7–10.3)
Chloride: 99 mmol/L (ref 96–106)
Creatinine, Ser: 0.92 mg/dL (ref 0.57–1.00)
Glucose: 107 mg/dL — ABNORMAL HIGH (ref 70–99)
Potassium: 3.2 mmol/L — ABNORMAL LOW (ref 3.5–5.2)
Sodium: 143 mmol/L (ref 134–144)
eGFR: 64 mL/min/{1.73_m2} (ref >59)

## 2021-05-24 LAB — CBC
Hematocrit: 31.1 % — ABNORMAL LOW (ref 34.0–46.6)
Hemoglobin: 9.8 g/dL — ABNORMAL LOW (ref 11.1–15.9)
MCH: 26.8 pg (ref 26.6–33.0)
MCHC: 31.5 g/dL (ref 31.5–35.7)
MCV: 85 fL (ref 79–97)
Platelets: 223 10*3/uL (ref 150–450)
RBC: 3.65 x10E6/uL — ABNORMAL LOW (ref 3.77–5.28)
RDW: 14.9 % (ref 11.7–15.4)
WBC: 5.2 10*3/uL (ref 3.4–10.8)

## 2021-05-24 MED ORDER — POTASSIUM CHLORIDE CRYS ER 10 MEQ PO TBCR: 10.0000 meq | EXTENDED_RELEASE_TABLET | Freq: Two times a day (BID) | ORAL | 1 refills | Status: DC

## 2021-05-24 NOTE — Telephone Encounter (Signed)
Spoke with patient and aware of results with recommendations with verbalization of understanding. Patient stated she needed to change date of  procedure  06-05-2021 to the following week or any other date if possible due to husband awards ceremony. Patient stated she was getting on a flight to [place] for this weekend an [place] can call with a better date when she available. ?

## 2021-05-24 NOTE — Telephone Encounter (Signed)
-----   Message from [place], [place] sent at 05/24/2021  8:28 AM EDT ----- ?Labs look ok for her procedure ?Potassium is low increase her potassium to 48mq BID and repeat BMET at her gen change procedure ? ?Please remind her of her pre-procedure instructions since they left in a bit of a hurry yesterday  ?NPO after MN and no Eliquis 2 days  ?

## 2021-05-27 NOTE — Addendum Note (Signed)
Addended by: Gar Ponto on: 05/27/2021 01:12 PM ? ? Modules accepted: Orders ? ?

## 2021-06-04 ENCOUNTER — Other Ambulatory Visit: Payer: Self-pay | Admitting: Neurology

## 2021-06-04 NOTE — Telephone Encounter (Signed)
Aware am looking into some dates mid June. ?Hoping we can schedule her 6/14 if MD able to work himself into hospital. ?Aware will be in touch soon. ?Patient verbalized understanding and agreeable to plan.  ? ?

## 2021-06-04 NOTE — Telephone Encounter (Signed)
Left message to call back  

## 2021-06-04 NOTE — Telephone Encounter (Signed)
Patient returning call.

## 2021-06-05 ENCOUNTER — Encounter (HOSPITAL_COMMUNITY): Payer: Self-pay

## 2021-06-05 ENCOUNTER — Ambulatory Visit (HOSPITAL_COMMUNITY): Admit: 2021-06-05 | Payer: Medicare Other | Admitting: Cardiology

## 2021-06-05 SURGERY — PPM GENERATOR CHANGEOUT

## 2021-06-11 ENCOUNTER — Encounter (HOSPITAL_COMMUNITY): Payer: Self-pay | Admitting: Cardiology

## 2021-06-11 ENCOUNTER — Ambulatory Visit (HOSPITAL_COMMUNITY)
Admission: RE | Admit: 2021-06-11 | Discharge: 2021-06-11 | Disposition: A | Discharge status: Home / Self Care | Payer: Medicare Other | Source: Ambulatory Visit | Attending: Cardiology | Admitting: Cardiology

## 2021-06-11 ENCOUNTER — Other Ambulatory Visit (HOSPITAL_COMMUNITY): Payer: Self-pay

## 2021-06-11 VITALS — BP 129/70 | HR 74 | Wt 129.4 lb

## 2021-06-11 DIAGNOSIS — Z950 Presence of cardiac pacemaker: Secondary | ICD-10-CM | POA: Diagnosis not present

## 2021-06-11 DIAGNOSIS — I5032 Chronic diastolic (congestive) heart failure: Secondary | ICD-10-CM

## 2021-06-11 DIAGNOSIS — G20 Parkinson's disease: Secondary | ICD-10-CM | POA: Insufficient documentation

## 2021-06-11 DIAGNOSIS — I110 Hypertensive heart disease with heart failure: Secondary | ICD-10-CM | POA: Diagnosis not present

## 2021-06-11 DIAGNOSIS — I272 Pulmonary hypertension, unspecified: Secondary | ICD-10-CM | POA: Diagnosis not present

## 2021-06-11 DIAGNOSIS — Z79899 Other long term (current) drug therapy: Secondary | ICD-10-CM | POA: Insufficient documentation

## 2021-06-11 DIAGNOSIS — I480 Paroxysmal atrial fibrillation: Secondary | ICD-10-CM | POA: Diagnosis not present

## 2021-06-11 DIAGNOSIS — I495 Sick sinus syndrome: Secondary | ICD-10-CM | POA: Insufficient documentation

## 2021-06-11 DIAGNOSIS — Z7901 Long term (current) use of anticoagulants: Secondary | ICD-10-CM | POA: Diagnosis not present

## 2021-06-11 DIAGNOSIS — R0602 Shortness of breath: Secondary | ICD-10-CM

## 2021-06-11 DIAGNOSIS — K760 Fatty (change of) liver, not elsewhere classified: Secondary | ICD-10-CM | POA: Insufficient documentation

## 2021-06-11 LAB — CBC
HCT: 32.8 % — ABNORMAL LOW (ref 36.0–46.0)
Hemoglobin: 10.3 g/dL — ABNORMAL LOW (ref 12.0–15.0)
MCH: 27.7 pg (ref 26.0–34.0)
MCHC: 31.4 g/dL (ref 30.0–36.0)
MCV: 88.2 fL (ref 80.0–100.0)
Platelets: 177 10*3/uL (ref 150–400)
RBC: 3.72 MIL/uL — ABNORMAL LOW (ref 3.87–5.11)
RDW: 16.3 % — ABNORMAL HIGH (ref 11.5–15.5)
WBC: 6.2 10*3/uL (ref 4.0–10.5)
nRBC: 0 % (ref 0.0–0.2)

## 2021-06-11 LAB — BRAIN NATRIURETIC PEPTIDE: B Natriuretic Peptide: 90.8 pg/mL (ref 0.0–100.0)

## 2021-06-11 LAB — BASIC METABOLIC PANEL
Anion gap: 8 (ref 5–15)
BUN: 16 mg/dL (ref 8–23)
CO2: 28 mmol/L (ref 22–32)
Calcium: 9.6 mg/dL (ref 8.9–10.3)
Chloride: 103 mmol/L (ref 98–111)
Creatinine, Ser: 0.76 mg/dL (ref 0.44–1.00)
GFR, Estimated: >60 mL/min (ref >60)
Glucose, Bld: 97 mg/dL (ref 70–99)
Potassium: 3.8 mmol/L (ref 3.5–5.1)
Sodium: 139 mmol/L (ref 135–145)

## 2021-06-11 NOTE — H&P (View-Only) (Signed)
PCP: Crist Infante, MD ?EP: Dr. Curt Bears ?[facility] Cardiology: Dr. Aundra Dubin ? ?81 y.o. 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  Patient had an echo done in 3/23 showing EF 65-70%, mild LVH, normal RV, PASP 60 mmHg, moderate biatrial enlargement, mild MR, moderate TR. Prior echoes had shown mildly elevated PA pressure. Cardiolite in 10/21 showed no ischemia.  ? ?Symptomatically, she has been fairly stable.  She has been taking Lasix for "years."  Says she started it due to ankle swelling.  She is short of breath walking up a hill but generally can climb a flight of stairs without issue.  She is short of breath with fast walking.  No chest pain.  No lightheadedness.  Rare palpitations.  No orthopnea/PND.  No syncope. She has no history of venous thromboembolism and no family history of pulmonary hypertension or cardiomyopathy.   ? ?ECG (personally reviewed): A-paced, lateral TWIs ? ?Labs (3/23): hgb 9.7 ?Labs (4/23): K 3.2, creatinine 0.92 ? ?PMH: ?1. Atrial fibrillation: Paroxysmal.  S/p AF ablation in 10/13.  ?2. Tachy-brady syndrome: Medtronic PPM placed in 9/14.  ?3. H/o atypical atrial flutter ?4. Hyperlipidemia ?5. HTN ?6. Hypothyroidism ?7. Parkinsons disease: Followed by Dr. Carles Collet ?8. Cirrhosis: ?NAFLD. No ETOH.  ?9. Chronic diastolic CHF/pulmonary hypertension:  ?- LHC (2014): No significant CAD.  ?- Cardiolite (10/21): EF 71%, no ischemia.  ?- Echo (3/23): EF 65-70%, mild LVH, normal RV, PASP 60 mmHg, moderate biatrial enlargement, mild MR, moderate TR.  ?10. B12 deficiency.  ? ?Social History  ? ?Socioeconomic History  ? Marital status: Married  ?  Spouse name: Not on file  ? Number of children: 2  ? Years of education: Not on file  ? Highest education level: Master's degree (e.g., MA, MS, MEng, MEd, MSW, MBA)  ?Occupational History  ? Not on  file  ?Tobacco Use  ? Smoking status: Never  ? Smokeless tobacco: Never  ?Vaping Use  ? Vaping Use: Never used  ?Substance and Sexual Activity  ? Alcohol use: No  ? Drug use: No  ? Sexual activity: Yes  ?  Partners: Male  ?  Birth control/protection: Post-menopausal  ?Other Topics Concern  ? Not on file  ?Social History Narrative  ? Lives in [place].  Retired Licensed conveyancer.  ? ?Social Determinants of Health  ? ?Financial Resource Strain: Not on file  ?Food Insecurity: Not on file  ?Transportation Needs: Not on file  ?Physical Activity: Not on file  ?Stress: Not on file  ?Social Connections: Not on file  ?Intimate Partner Violence: Not on file  ? ?Family History  ?Problem Relation Age of Onset  ? Alzheimer's disease Mother   ? Heart failure Father   ? Healthy Child   ? Healthy Child   ? Breast cancer Maternal Aunt   ? ?ROS: All systems reviewed and negative except as per HPI.  ? ?Current Outpatient Medications  ?Medication Sig Dispense Refill  ? Ascorbic Acid (VITA-C PO) Take 1 capsule by mouth daily.    ? CALCIUM PO Take 1 tablet by mouth at bedtime.    ? Calcium Polycarbophil (FIBERCON PO) Take 2 tablets by mouth every morning.    ? carbidopa-levodopa (SINEMET CR) 50-200 MG tablet TAKE 1 TABLET BY MOUTH EVERYDAY AT BEDTIME 90 tablet 1  ? carbidopa-levodopa (SINEMET IR) 25-100 MG tablet Take 2 tablets by mouth 3 (three) times daily.  540 tablet 1  ? cyanocobalamin (,VITAMIN B-12,) 1000 MCG/ML injection INJECT 1ML INTRAMUSCULARLY IN THE DELTOID ONCE A MONTH (ALTERNATING DELT. EACH MONTH)    ? cyanocobalamin 100 MCG tablet Take 100 mcg by mouth daily.    ? Docosahexaenoic Acid (DHA PO) Take 1 tablet by mouth daily.    ? Docusate Sodium (COLACE PO) Take 1 capsule by mouth 2 (two) times daily as needed (constipation).    ? ELIQUIS 5 MG TABS tablet TAKE 1 TABLET BY MOUTH TWICE A DAY 60 tablet 5  ? ezetimibe (ZETIA) 10 MG tablet Take 10 mg by mouth every other day. Taking the same day as the crestor    ? furosemide (LASIX)  40 MG tablet Take 1.5 tablets (60 mg total) by mouth daily. 180 tablet 1  ? levothyroxine (SYNTHROID) 50 MCG tablet Take 50 mcg by mouth daily. Pt take 1 1/2 tablet on Monday and Thursday and 1 tablet the rest of the week    ? metoprolol succinate (TOPROL-XL) 25 MG 24 hr tablet TAKE 1 TABLET BY MOUTH DAILY. 90 tablet 1  ? multivitamin-lutein (OCUVITE-LUTEIN) CAPS capsule Take 1 capsule by mouth daily.    ? NON FORMULARY Liposomal Glutathione take 1 table qd    ? potassium chloride (KLOR-CON M) 10 MEQ tablet Take 1 tablet (10 mEq total) by mouth 2 (two) times daily. 180 tablet 1  ? Probiotic Product (PROBIOTIC-10 PO) Take 1 tablet by mouth daily. Lafonda Mosses Brand    ? raloxifene (EVISTA) 60 MG tablet Take 60 mg by mouth daily.    ? rasagiline (AZILECT) 1 MG TABS tablet Take 1 tablet (1 mg total) by mouth daily. 90 tablet 1  ? rosuvastatin (CRESTOR) 5 MG tablet Take 2.5 mg by mouth every other day.    ? valACYclovir (VALTREX) 1000 MG tablet Take 1,000 mg by mouth 2 (two) times daily as needed (fever blisters).    ? VITAMIN D, ERGOCALCIFEROL, PO Take 1,000 Units by mouth daily.     ? ?No current facility-administered medications for this encounter.  ? ?BP 129/70   Pulse 74   Wt 58.7 kg (129 lb 6.4 oz)   LMP 02/04/1996   SpO2 99%   BMI 23.67 kg/m?  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  ?Lungs: Clear to auscultation bilaterally with normal respiratory effort. ?CV: Nondisplaced PMI.  Heart regular S1/S2, no S3/S4, 1/6 SEM RUSB.  No peripheral edema.  No carotid bruit.  Normal pedal pulses.  ?Abdomen: Soft, nontender, no hepatosplenomegaly, no distention.  ?Skin: Intact without lesions or rashes.  ?Neurologic: Alert and oriented x 3.  ?Psych: Normal affect. ?Extremities: No clubbing or cyanosis.  ?HEENT: Normal.  ? ?Assessment/Plan: ?1. Pulmonary hypertension: Patient was noted on 3/23 echo to have moderately elevated PA pressure (60 mmHg).  Prior echoes had shown mildly elevated PA  pressure.  RV was normal on this echo, there was moderate TR.  Cause of pulmonary hypertension (if estimation from echo is accurate) is uncertain.  Diastolic LV dysfunction with elevated LA pressure (pulmonary venous hypertension) is certainly possible.  Would also consider portopulmonary hypertension (she has known cirrhosis).  CTEPH unlikely with chronic anticoagulation. No known lung disease and lungs clear on exam.  No known rheumatologic disease.   ?- The first step will be RHC to establish diagnosis and determine pulmonary arterial hypertension versus pulmonary venous hypertension.  I will arrange for RHC.  Discussed risks/benefits with patient and she agrees to procedure.  She  will hold apixaban the day prior to the procedure and the morning of.   ?- Check BNP.  ?2. Chronic diastolic CHF: Versus RV failure from pulmonary hypertension.  Patient is on Lasix 60 mg daily.  On exam, she does appear mildly volume overloaded.  ?- BMET/BNP today.  ?- I will not increase Lasix today, will await result of RHC to decide on what to do with diuretic.  ?3. Atrial fibrillation: Paroxysmal. She is in NSR today.  ?- Continue Eliquis.  ?- Continue Toprol XL.  ?4. Tachy-brady syndrome: Has MDT PPM.  ?5. Cirrhosis: ?NAFLD.  Does not drink any ETOH now, was not a heavy drinker before.  ?- Followed by [facility].  ? ?Followup after cath.  ? ?Loralie Champagne ?06/11/2021 ? ?

## 2021-06-11 NOTE — Patient Instructions (Signed)
There has been no changes to your medications. ? ?Labs done today, your results will be available in MyChart, we will contact you for abnormal readings. ? ? ?You are scheduled for a Cardiac Catheterization on Thursday, May 11 with Dr. Loralie Champagne. ? ?1. Please arrive at the Main Entrance A at [facility]: [place], [place] [address] at 10:00 AM (This time is two hours before your procedure to ensure your preparation). Free valet parking service is available.  ? ?Special note: Every effort is made to have your procedure done on time. Please understand that emergencies sometimes delay scheduled procedures. ? ?2. Diet: Do not eat solid foods after midnight.  You may have clear liquids until 5 AM upon the day of the procedure. ? ?3. Labs: Have been drawn today 06/11/2021 ? ?4. Medication instructions in preparation for your procedure: ? ? Contrast Allergy: No ? ? ?Stop taking Eliquis (Apixiban) on Wednesday, May 10. ? ?On the morning of your procedure, take morning medicines NOT listed above.  You may use sips of water. ? ?5. Plan to go home the same day, you will only stay overnight if medically necessary. ?6. You MUST have a responsible adult to drive you home. ?7. An adult MUST be with you the first 24 hours after you arrive home. ?8. Bring a current list of your medications, and the last time and date medication taken. ?9. Bring ID and current insurance cards. ?10.Please wear clothes that are easy to get on and off and wear slip-on shoes. ? ?Your physician recommends that you schedule a follow-up appointment in: 1 month ? ?If you have any questions or concerns before your next appointment please send [place] a message through [employer] [facility] [employer] or call our office at [phone].   ? ?TO LEAVE A MESSAGE FOR THE NURSE SELECT OPTION 2, PLEASE LEAVE A MESSAGE INCLUDING: ?YOUR NAME ?DATE OF BIRTH ?CALL BACK NUMBER ?REASON FOR CALL**this is important as we prioritize the call backs ? ?YOU WILL RECEIVE A CALL BACK  THE SAME DAY AS LONG AS YOU CALL BEFORE 4:00 PM ? ?At the [facility], you and your health needs are our priority. As part of our continuing mission to provide you with exceptional heart care, we have created designated Provider Care Teams. These Care Teams include your primary Cardiologist (physician) and Advanced Practice Providers (APPs- Physician Assistants and Nurse Practitioners) who all work together to provide you with the care you need, when you need it.  ? ?You may see any of the following providers on your designated Care Team at your next follow up: ?Dr Glori Bickers ?Dr Loralie Champagne ?Darrick Grinder, NP ?Lyda Jester, PA ?Jessica Milford,NP ?Marlyce Huge, PA ?Audry Riles, PharmD ? ? ?Please be sure to bring in all your medications bottles to every appointment.  ? ? ? ?

## 2021-06-11 NOTE — Progress Notes (Addendum)
PCP: Crist Infante, MD ?EP: Dr. Curt Bears ?HF Cardiology: Dr. Aundra Dubin ? ?81 y.o. 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  Patient had an echo done in 3/23 showing EF 65-70%, mild LVH, normal RV, PASP 60 mmHg, moderate biatrial enlargement, mild MR, moderate TR. Prior echoes had shown mildly elevated PA pressure. Cardiolite in 10/21 showed no ischemia.  ? ?Symptomatically, she has been fairly stable.  She has been taking Lasix for "years."  Says she started it due to ankle swelling.  She is short of breath walking up a hill but generally can climb a flight of stairs without issue.  She is short of breath with fast walking.  No chest pain.  No lightheadedness.  Rare palpitations.  No orthopnea/PND.  No syncope. She has no history of venous thromboembolism and no family history of pulmonary hypertension or cardiomyopathy.   ? ?ECG (personally reviewed): A-paced, lateral TWIs ? ?Labs (3/23): hgb 9.7 ?Labs (4/23): K 3.2, creatinine 0.92 ? ?PMH: ?1. Atrial fibrillation: Paroxysmal.  S/p AF ablation in 10/13.  ?2. Tachy-brady syndrome: Medtronic PPM placed in 9/14.  ?3. H/o atypical atrial flutter ?4. Hyperlipidemia ?5. HTN ?6. Hypothyroidism ?7. Parkinsons disease: Followed by Dr. Carles Collet ?8. Cirrhosis: ?NAFLD. No ETOH.  ?9. Chronic diastolic CHF/pulmonary hypertension:  ?- LHC (2014): No significant CAD.  ?- Cardiolite (10/21): EF 71%, no ischemia.  ?- Echo (3/23): EF 65-70%, mild LVH, normal RV, PASP 60 mmHg, moderate biatrial enlargement, mild MR, moderate TR.  ?10. B12 deficiency.  ? ?Social History  ? ?Socioeconomic History  ? Marital status: Married  ?  Spouse name: Not on file  ? Number of children: 2  ? Years of education: Not on file  ? Highest education level: Master's degree (e.g., MA, MS, MEng, MEd, MSW, MBA)  ?Occupational History  ? Not on  file  ?Tobacco Use  ? Smoking status: Never  ? Smokeless tobacco: Never  ?Vaping Use  ? Vaping Use: Never used  ?Substance and Sexual Activity  ? Alcohol use: No  ? Drug use: No  ? Sexual activity: Yes  ?  Partners: Male  ?  Birth control/protection: Post-menopausal  ?Other Topics Concern  ? Not on file  ?Social History Narrative  ? 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  ? ?Social Determinants of Health  ? ?Financial Resource Strain: Not on file  ?Food Insecurity: Not on file  ?Transportation Needs: Not on file  ?Physical Activity: Not on file  ?Stress: Not on file  ?Social Connections: Not on file  ?Intimate Partner Violence: Not on file  ? ?Family History  ?Problem Relation Age of Onset  ? Alzheimer's disease Mother   ? Heart failure Father   ? Healthy Child   ? Healthy Child   ? Breast cancer Maternal Aunt   ? ?ROS: All systems reviewed and negative except as per HPI.  ? ?Current Outpatient Medications  ?Medication Sig Dispense Refill  ? Ascorbic Acid (VITA-C PO) Take 1 capsule by mouth daily.    ? CALCIUM PO Take 1 tablet by mouth at bedtime.    ? Calcium Polycarbophil (FIBERCON PO) Take 2 tablets by mouth every morning.    ? carbidopa-levodopa (SINEMET CR) 50-200 MG tablet TAKE 1 TABLET BY MOUTH EVERYDAY AT BEDTIME 90 tablet 1  ? carbidopa-levodopa (SINEMET IR) 25-100 MG tablet Take 2 tablets by mouth 3 (three) times daily.  540 tablet 1  ? cyanocobalamin (,VITAMIN B-12,) 1000 MCG/ML injection INJECT 1ML INTRAMUSCULARLY IN THE DELTOID ONCE A MONTH (ALTERNATING DELT. EACH MONTH)    ? cyanocobalamin 100 MCG tablet Take 100 mcg by mouth daily.    ? Docosahexaenoic Acid (DHA PO) Take 1 tablet by mouth daily.    ? Docusate Sodium (COLACE PO) Take 1 capsule by mouth 2 (two) times daily as needed (constipation).    ? ELIQUIS 5 MG TABS tablet TAKE 1 TABLET BY MOUTH TWICE A DAY 60 tablet 5  ? ezetimibe (ZETIA) 10 MG tablet Take 10 mg by mouth every other day. Taking the same day as the crestor    ? furosemide (LASIX)  40 MG tablet Take 1.5 tablets (60 mg total) by mouth daily. 180 tablet 1  ? levothyroxine (SYNTHROID) 50 MCG tablet Take 50 mcg by mouth daily. Pt take 1 1/2 tablet on Monday and Thursday and 1 tablet the rest of the week    ? metoprolol succinate (TOPROL-XL) 25 MG 24 hr tablet TAKE 1 TABLET BY MOUTH DAILY. 90 tablet 1  ? multivitamin-lutein (OCUVITE-LUTEIN) CAPS capsule Take 1 capsule by mouth daily.    ? NON FORMULARY Liposomal Glutathione take 1 table qd    ? potassium chloride (KLOR-CON M) 10 MEQ tablet Take 1 tablet (10 mEq total) by mouth 2 (two) times daily. 180 tablet 1  ? Probiotic Product (PROBIOTIC-10 PO) Take 1 tablet by mouth daily. Lafonda Mosses Brand    ? raloxifene (EVISTA) 60 MG tablet Take 60 mg by mouth daily.    ? rasagiline (AZILECT) 1 MG TABS tablet Take 1 tablet (1 mg total) by mouth daily. 90 tablet 1  ? rosuvastatin (CRESTOR) 5 MG tablet Take 2.5 mg by mouth every other day.    ? valACYclovir (VALTREX) 1000 MG tablet Take 1,000 mg by mouth 2 (two) times daily as needed (fever blisters).    ? VITAMIN D, ERGOCALCIFEROL, PO Take 1,000 Units by mouth daily.     ? ?No current facility-administered medications for this encounter.  ? ?BP 129/70   Pulse 74   Wt 58.7 kg (129 lb 6.4 oz)   LMP 02/04/1996   SpO2 99%   BMI 23.67 kg/m?  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  ?Lungs: Clear to auscultation bilaterally with normal respiratory effort. ?CV: Nondisplaced PMI.  Heart regular S1/S2, no S3/S4, 1/6 SEM RUSB.  No peripheral edema.  No carotid bruit.  Normal pedal pulses.  ?Abdomen: Soft, nontender, no hepatosplenomegaly, no distention.  ?Skin: Intact without lesions or rashes.  ?Neurologic: Alert and oriented x 3.  ?Psych: Normal affect. ?Extremities: No clubbing or cyanosis.  ?HEENT: Normal.  ? ?Assessment/Plan: ?1. Pulmonary hypertension: Patient was noted on 3/23 echo to have moderately elevated PA pressure (60 mmHg).  Prior echoes had shown mildly elevated PA  pressure.  RV was normal on this echo, there was moderate TR.  Cause of pulmonary hypertension (if estimation from echo is accurate) is uncertain.  Diastolic LV dysfunction with elevated LA pressure (pulmonary venous hypertension) is certainly possible.  Would also consider portopulmonary hypertension (she has known cirrhosis).  CTEPH unlikely with chronic anticoagulation. No known lung disease and lungs clear on exam.  No known rheumatologic disease.   ?- The first step will be RHC to establish diagnosis and determine pulmonary arterial hypertension versus pulmonary venous hypertension.  I will arrange for RHC.  Discussed risks/benefits with patient and she agrees to procedure.  She  will hold apixaban the day prior to the procedure and the morning of.   ?- Check BNP.  ?2. Chronic diastolic CHF: Versus RV failure from pulmonary hypertension.  Patient is on Lasix 60 mg daily.  On exam, she does appear mildly volume overloaded.  ?- BMET/BNP today.  ?- I will not increase Lasix today, will await result of RHC to decide on what to do with diuretic.  ?3. Atrial fibrillation: Paroxysmal. She is in NSR today.  ?- Continue Eliquis.  ?- Continue Toprol XL.  ?4. Tachy-brady syndrome: Has MDT PPM.  ?5. Cirrhosis: ?NAFLD.  Does not drink any ETOH now, was not a heavy drinker before.  ?- Followed by [facility].  ? ?Followup after cath.  ? ?Loralie Champagne ?06/11/2021 ? ?

## 2021-06-12 ENCOUNTER — Telehealth: Payer: Self-pay | Admitting: Internal Medicine

## 2021-06-12 ENCOUNTER — Encounter (INDEPENDENT_AMBULATORY_CARE_PROVIDER_SITE_OTHER): Payer: Self-pay | Admitting: Ophthalmology

## 2021-06-12 ENCOUNTER — Ambulatory Visit (INDEPENDENT_AMBULATORY_CARE_PROVIDER_SITE_OTHER): Payer: Medicare Other | Admitting: Ophthalmology

## 2021-06-12 DIAGNOSIS — H35721 Serous detachment of retinal pigment epithelium, right eye: Secondary | ICD-10-CM | POA: Diagnosis not present

## 2021-06-12 DIAGNOSIS — H353211 Exudative age-related macular degeneration, right eye, with active choroidal neovascularization: Secondary | ICD-10-CM

## 2021-06-12 MED ORDER — AFLIBERCEPT 2MG/0.05ML IZ SOLN FOR KALEIDOSCOPE
2.0000 mg | INTRAVITREAL | Status: AC | PRN
  Administered 2021-06-12: 2 mg via INTRAVITREAL

## 2021-06-12 NOTE — Assessment & Plan Note (Signed)
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  Repeat injection today after Avastin No. 1 and reevaluate next in 5 weeks ?

## 2021-06-12 NOTE — Telephone Encounter (Signed)
I called pt to reschedule her Gen Change procedure. She is now scheduled for 07/17/2021 @ 11:30. She stated that she still has her instruction sheet and understands them.  ?She has an appt with the [facility] on 6/9 and will have her pre procedure labs done that day. She is aware and I have added it to the appt notes.  ? ?

## 2021-06-12 NOTE — Progress Notes (Signed)
? ? ?06/12/2021 ? ?  ? ?CHIEF COMPLAINT ?Patient presents for  ?Chief Complaint  ?Patient presents with  ? Macular Degeneration  ? ? ? ? ?HISTORY OF PRESENT ILLNESS: ?[name] [name] is a 81 y.o. female who presents to the clinic today for:  ? ?HPI   ?5 weeks dilate Avastin OD, OCT.  History of injection of 1 Avastin some 5 weeks. ?Patient states "I don't think I am seeing as well out of the left eye. I have a new floater in the left eye, I noticed it about a week ago. It looks a little blurrier on that eye. The right eye seems about the same. The last time I was here and had the injection it ached for about 24 hours, and I had a red spot on my eye." ?Patient not allergic to anything we use for injection. ?Patient is not using any prescription eye drops. ?Last edited by Hurman Horn, MD on 06/12/2021 12:17 PM.  ?  ? ? ?Referring physician: ?Crist Infante, MD ?[address] ?[place],  [place] [address] ? ?HISTORICAL INFORMATION:  ? ?Selected notes from the [facility] ?  ? ?Lab Results  ?Component Value Date  ? HGBA1C  05/21/2007  ?  5.6 ?(NOTE)   The ADA recommends the following therapeutic goals for glycemic   control related to Hgb A1C measurement:   Goal of Therapy:   < 7.0% Hgb A1C   Action Suggested:  > 8.0% Hgb A1C   Ref:  Diabetes Care, 22, Suppl. 1, 1999  ?  ? ?CURRENT MEDICATIONS: ?No current outpatient medications on file. (Ophthalmic Drugs)  ? ?No current facility-administered medications for this visit. (Ophthalmic Drugs)  ? ?Current Outpatient Medications (Other)  ?Medication Sig  ? Ascorbic Acid (VITA-C PO) Take 1 capsule by mouth daily.  ? CALCIUM PO Take 1 tablet by mouth at bedtime.  ? Calcium Polycarbophil (FIBERCON PO) Take 2 tablets by mouth every morning.  ? carbidopa-levodopa (SINEMET CR) 50-200 MG tablet TAKE 1 TABLET BY MOUTH EVERYDAY AT BEDTIME  ? carbidopa-levodopa (SINEMET IR) 25-100 MG tablet Take 2 tablets by mouth 3 (three) times daily.  ? cyanocobalamin (,VITAMIN B-12,) 1000  MCG/ML injection INJECT 1ML INTRAMUSCULARLY IN THE DELTOID ONCE A MONTH (ALTERNATING DELT. EACH MONTH)  ? cyanocobalamin 100 MCG tablet Take 100 mcg by mouth daily.  ? Docosahexaenoic Acid (DHA PO) Take 1 tablet by mouth daily.  ? Docusate Sodium (COLACE PO) Take 1 capsule by mouth 2 (two) times daily as needed (constipation).  ? ELIQUIS 5 MG TABS tablet TAKE 1 TABLET BY MOUTH TWICE A DAY  ? ezetimibe (ZETIA) 10 MG tablet Take 10 mg by mouth every other day. Taking the same day as the crestor  ? furosemide (LASIX) 40 MG tablet Take 1.5 tablets (60 mg total) by mouth daily.  ? levothyroxine (SYNTHROID) 50 MCG tablet Take 50 mcg by mouth daily. Pt take 1 1/2 tablet on Monday and Thursday and 1 tablet the rest of the week  ? metoprolol succinate (TOPROL-XL) 25 MG 24 hr tablet TAKE 1 TABLET BY MOUTH DAILY.  ? multivitamin-lutein (OCUVITE-LUTEIN) CAPS capsule Take 1 capsule by mouth daily.  ? NON FORMULARY Liposomal Glutathione take 1 table qd  ? potassium chloride (KLOR-CON M) 10 MEQ tablet Take 1 tablet (10 mEq total) by mouth 2 (two) times daily.  ? Probiotic Product (PROBIOTIC-10 PO) Take 1 tablet by mouth daily. Lafonda Mosses Brand  ? raloxifene (EVISTA) 60 MG tablet Take 60 mg by mouth daily.  ?  rasagiline (AZILECT) 1 MG TABS tablet Take 1 tablet (1 mg total) by mouth daily.  ? rosuvastatin (CRESTOR) 5 MG tablet Take 2.5 mg by mouth every other day.  ? valACYclovir (VALTREX) 1000 MG tablet Take 1,000 mg by mouth 2 (two) times daily as needed (fever blisters).  ? VITAMIN D, ERGOCALCIFEROL, PO Take 1,000 Units by mouth daily.   ? ?No current facility-administered medications for this visit. (Other)  ? ? ? ? ?REVIEW OF SYSTEMS: ?ROS   ?Negative for: Constitutional, Gastrointestinal, Neurological, Skin, Genitourinary, Musculoskeletal, HENT, Endocrine, Cardiovascular, Eyes, Respiratory, Psychiatric, Allergic/Imm, Heme/Lymph ?Last edited by Hurman Horn, MD on 06/12/2021 12:13 PM.  ?  ? ? ? ?ALLERGIES ?Allergies  ?Allergen  Reactions  ? Amiodarone Other (See Comments)  ? Bisphosphonates Other (See Comments)  ? Statins   ?  Leg weakness - but tolerating low dose Crestor every other day  ? ? ?PAST MEDICAL HISTORY ?Past Medical History:  ?Diagnosis Date  ? Anemia   ? Arthritis   ? thumbs  ? Atrial tachycardia ([place])   ? 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 Cataract   ? bilateral  ? Chronic anticoagulation   ? on Xarelto  ? Chronic diastolic CHF (congestive heart failure) ([place])   ? Echocardiogram (11/24/12): Mild LVH, EF 60%.  ? Dysrhythmia   ? GERD (gastroesophageal reflux disease)   ? hx of no meds  ? Heart murmur   ? slight  ? Hyperlipidemia   ? a. Hx of leg weakness while on statin. under control  ? Hypertension   ? Hypothyroidism   ? Leg fracture Dec 21,2011  ? Osteoporosis 07/2007  ? Parkinson's disease ([place]) 2011  ? Persistent atrial fibrillation (H[facility])   ? s/p atrial fib ablation 11-11-11.   ? PONV (postoperative nausea and vomiting)   ? Presence of permanent cardiac pacemaker   ? permanent Medtronic  ? Shortness of breath   ? "when walking at incline or stairs"  ? 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 Wrist fracture   ? right  ? ?Past Surgical History:  ?Procedure Laterality Date  ? ATRIAL FIBRILLATION ABLATION N/A 11/11/2011  ? Procedure: ATRIAL FIBRILLATION ABLATION;  Surgeon: Thompson Grayer, MD;  Location: [facility] CATH LAB;  Service: Cardiovascular;  Laterality: N/A;  ? BREAST BIOPSY  1998  ? BREAST SURGERY    ? benign cyst removed  ? CARDIAC ELECTROPHYSIOLOGY STUDY AND ABLATION  5/14, 7/14  ? convergent AF ablation at [facility] and subsequent atypical atrial flutter ablation by Dr Lehman Prom  ? CARDIOVASCULAR STRESS TEST  06-06-2008  ? EF 73%  ? CARDIOVERSION  09/09/2011  ? Procedure: CARDIOVERSION;  Surgeon: Darlin Coco, MD;  Location: [facility] ENDOSCOPY;  Service: Cardiovascular;  Laterality: N/A;  to be done by dr. Mare Ferrari  ? CARDIOVERSION  02/18/2012  ? Procedure: CARDIOVERSION;  Surgeon: Thayer Headings, MD;  Location: [facility];  Service: Cardiovascular;  Laterality: N/A;  ? CHOLECYSTECTOMY  1995  ? FEMUR FRACTURE SURGERY  2011  ? metal pin in place  ? FOREARM SURGERY Left 2010  ? "opened arm to drain it"  ? INSERTION OF MESH N/A 11/24/2013  ? Procedure: INSERTION OF MESH;  Surgeon: Michael Boston, MD;  Location: [facility] ORS;  Service: General;  Laterality: N/A;  ? KNEE SURGERY Left 2001  ? Left unicompartmental knee    ? Dr. Alvan Dame 11/18/16  ? PACEMAKER PLACEMENT  10/20/12  ? MDT Vilma Prader  DR implanted by Dr Lehman Prom at [facility]   ? PARTIAL KNEE ARTHROPLASTY Left 11/18/2016  ? Procedure: LEFT UNICOMPARTMENTAL KNEE Medially;  Surgeon: Paralee Cancel, MD;  Location: [facility] ORS;  Service: Orthopedics;  Laterality: Left;  90 mins  ? SKIN CANCER DESTRUCTION  summer 2015  ? TEE WITHOUT CARDIOVERSION  11/10/2011  ? Procedure: TRANSESOPHAGEAL ECHOCARDIOGRAM (TEE);  Surgeon: Thayer Headings, MD;  Location: [facility];  Service: Cardiovascular;  Laterality: N/A;  ? TONSILLECTOMY  as child  ? US ECHOCARDIOGRAPHY  03-10-2008  ? Est EF 55-60%, Dr. Cathie Olden  ? VENTRAL HERNIA REPAIR N/A 11/24/2013  ? Procedure: LAPAROSCOPIC VENTRAL HERNIA;  Surgeon: Michael Boston, MD;  Location: [facility];  Service: General;  Laterality: N/A;  ? WRIST SURGERY Right ~2009  ? metal rod in place  ? ? ?FAMILY HISTORY ?Family History  ?Problem Relation Age of Onset  ? Alzheimer's disease Mother   ? Heart failure Father   ? Healthy Child   ? Healthy Child   ? Breast cancer Maternal Aunt   ? ? ?SOCIAL HISTORY ?Social History  ? ?Tobacco Use  ? Smoking status: Never  ? Smokeless tobacco: Never  ?Vaping Use  ? Vaping Use: Never used  ?Substance Use Topics  ? Alcohol use: No  ? Drug use: No  ? ?  ? ?  ? ?OPHTHALMIC EXAM: ? ?Base Eye Exam   ? ? Visual Acuity (ETDRS)   ? ?   Right Left  ? Dist cc 20/80 +2 20/20  ? Dist ph cc NI   ? ? Correction: Glasses  ? ?  ?  ? ? Tonometry (Tonopen, 10:51 AM)   ? ?   Right Left  ? Pressure 10 18  ? ?  ?  ? ? Pupils   ? ?   Pupils Dark  Light APD  ? Right PERRL 4 3 None  ? Left PERRL 4 3 None  ? ?  ?  ? ? Extraocular Movement   ? ?   Right Left  ?  Full Full  ? ?  ?  ? ? Neuro/Psych   ? ? Oriented x3: Yes  ? Mood/Affect: Normal  ? ?  ?  ? ?

## 2021-06-12 NOTE — Telephone Encounter (Signed)
Patient states she was returning call. Please advise  

## 2021-06-12 NOTE — Assessment & Plan Note (Signed)
Component of wet AMD improved post injection of 1 Avastin ?

## 2021-06-13 ENCOUNTER — Ambulatory Visit (HOSPITAL_COMMUNITY)
Admission: RE | Admit: 2021-06-13 | Discharge: 2021-06-13 | Disposition: A | Discharge status: Home / Self Care | Payer: Medicare Other | Attending: Cardiology | Admitting: Cardiology

## 2021-06-13 ENCOUNTER — Other Ambulatory Visit: Payer: Self-pay

## 2021-06-13 ENCOUNTER — Encounter (HOSPITAL_COMMUNITY)
Admission: RE | Disposition: A | Discharge status: Home / Self Care | Payer: Self-pay | Source: Home / Self Care | Attending: Cardiology

## 2021-06-13 ENCOUNTER — Encounter (HOSPITAL_COMMUNITY): Payer: Self-pay | Admitting: Cardiology

## 2021-06-13 DIAGNOSIS — I5032 Chronic diastolic (congestive) heart failure: Secondary | ICD-10-CM | POA: Insufficient documentation

## 2021-06-13 DIAGNOSIS — I480 Paroxysmal atrial fibrillation: Secondary | ICD-10-CM | POA: Diagnosis not present

## 2021-06-13 DIAGNOSIS — I509 Heart failure, unspecified: Secondary | ICD-10-CM | POA: Diagnosis not present

## 2021-06-13 DIAGNOSIS — I110 Hypertensive heart disease with heart failure: Secondary | ICD-10-CM | POA: Diagnosis not present

## 2021-06-13 DIAGNOSIS — G20 Parkinson's disease: Secondary | ICD-10-CM | POA: Insufficient documentation

## 2021-06-13 DIAGNOSIS — Z79899 Other long term (current) drug therapy: Secondary | ICD-10-CM | POA: Diagnosis not present

## 2021-06-13 DIAGNOSIS — I272 Pulmonary hypertension, unspecified: Secondary | ICD-10-CM | POA: Insufficient documentation

## 2021-06-13 DIAGNOSIS — I495 Sick sinus syndrome: Secondary | ICD-10-CM | POA: Diagnosis not present

## 2021-06-13 DIAGNOSIS — R0602 Shortness of breath: Secondary | ICD-10-CM

## 2021-06-13 DIAGNOSIS — Z950 Presence of cardiac pacemaker: Secondary | ICD-10-CM | POA: Insufficient documentation

## 2021-06-13 DIAGNOSIS — K7460 Unspecified cirrhosis of liver: Secondary | ICD-10-CM | POA: Diagnosis not present

## 2021-06-13 DIAGNOSIS — Z7901 Long term (current) use of anticoagulants: Secondary | ICD-10-CM | POA: Insufficient documentation

## 2021-06-13 HISTORY — PX: RIGHT HEART CATH: CATH118263

## 2021-06-13 LAB — POCT I-STAT EG7
Acid-Base Excess: 5 mmol/L — ABNORMAL HIGH (ref 0.0–2.0)
Acid-Base Excess: 6 mmol/L — ABNORMAL HIGH (ref 0.0–2.0)
Bicarbonate: 30 mmol/L — ABNORMAL HIGH (ref 20.0–28.0)
Bicarbonate: 30.4 mmol/L — ABNORMAL HIGH (ref 20.0–28.0)
Calcium, Ion: 1.2 mmol/L (ref 1.15–1.40)
Calcium, Ion: 1.22 mmol/L (ref 1.15–1.40)
HCT: 28 % — ABNORMAL LOW (ref 36.0–46.0)
HCT: 29 % — ABNORMAL LOW (ref 36.0–46.0)
Hemoglobin: 9.5 g/dL — ABNORMAL LOW (ref 12.0–15.0)
Hemoglobin: 9.9 g/dL — ABNORMAL LOW (ref 12.0–15.0)
O2 Saturation: 74 %
O2 Saturation: 77 %
Potassium: 3.2 mmol/L — ABNORMAL LOW (ref 3.5–5.1)
Potassium: 3.2 mmol/L — ABNORMAL LOW (ref 3.5–5.1)
Sodium: 140 mmol/L (ref 135–145)
Sodium: 140 mmol/L (ref 135–145)
TCO2: 31 mmol/L (ref 22–32)
TCO2: 32 mmol/L (ref 22–32)
pCO2, Ven: 43.6 mmHg — ABNORMAL LOW (ref 44–60)
pCO2, Ven: 44.3 mmHg (ref 44–60)
pH, Ven: 7.445 — ABNORMAL HIGH (ref 7.25–7.43)
pH, Ven: 7.445 — ABNORMAL HIGH (ref 7.25–7.43)
pO2, Ven: 38 mmHg (ref 32–45)
pO2, Ven: 40 mmHg (ref 32–45)

## 2021-06-13 SURGERY — RIGHT HEART CATH
Anesthesia: LOCAL

## 2021-06-13 MED ORDER — SODIUM CHLORIDE 0.9% FLUSH: 3.0000 mL | Freq: Two times a day (BID) | INTRAVENOUS | Status: DC

## 2021-06-13 MED ORDER — HYDRALAZINE HCL 20 MG/ML IJ SOLN: 10.0000 mg | INTRAMUSCULAR | Status: DC | PRN

## 2021-06-13 MED ORDER — SODIUM CHLORIDE 0.9% FLUSH: 3.0000 mL | INTRAVENOUS | Status: DC | PRN

## 2021-06-13 MED ORDER — LIDOCAINE HCL (PF) 1 % IJ SOLN
INTRAMUSCULAR | Status: AC
  Filled 2021-06-13: qty 30

## 2021-06-13 MED ORDER — HEPARIN (PORCINE) IN NACL 1000-0.9 UT/500ML-% IV SOLN
INTRAVENOUS | Status: AC
  Filled 2021-06-13: qty 500

## 2021-06-13 MED ORDER — SODIUM CHLORIDE 0.9 % IV SOLN: 250.0000 mL | INTRAVENOUS | Status: DC | PRN

## 2021-06-13 MED ORDER — LABETALOL HCL 5 MG/ML IV SOLN: 10.0000 mg | INTRAVENOUS | Status: DC | PRN

## 2021-06-13 MED ORDER — HEPARIN (PORCINE) IN NACL 1000-0.9 UT/500ML-% IV SOLN
INTRAVENOUS | Status: DC | PRN
  Administered 2021-06-13: 500 mL

## 2021-06-13 MED ORDER — ACETAMINOPHEN 325 MG PO TABS: 650.0000 mg | ORAL_TABLET | ORAL | Status: DC | PRN

## 2021-06-13 MED ORDER — SODIUM CHLORIDE 0.9 % IV SOLN: INTRAVENOUS | Status: DC

## 2021-06-13 MED ORDER — LIDOCAINE HCL (PF) 1 % IJ SOLN
INTRAMUSCULAR | Status: DC | PRN
  Administered 2021-06-13: 2 mL via INTRADERMAL

## 2021-06-13 MED ORDER — ONDANSETRON HCL 4 MG/2ML IJ SOLN: 4.0000 mg | Freq: Four times a day (QID) | INTRAMUSCULAR | Status: DC | PRN

## 2021-06-13 SURGICAL SUPPLY — 6 items
CATH BALLN WEDGE 5F 110CM (CATHETERS) ×1 IMPLANT
KIT HEART LEFT (KITS) ×2 IMPLANT
PACK CARDIAC CATHETERIZATION (CUSTOM PROCEDURE TRAY) ×2 IMPLANT
SHEATH GLIDE SLENDER 4/5FR (SHEATH) ×1 IMPLANT
TRANSDUCER W/STOPCOCK (MISCELLANEOUS) ×2 IMPLANT
WIRE EMERALD 3MM-J .025X260CM (WIRE) ×1 IMPLANT

## 2021-06-13 NOTE — Discharge Instructions (Signed)
We will be starting an SGLT2 inhibitor, either [place] or Jardiance.  ?

## 2021-06-13 NOTE — Interval H&P Note (Signed)
History and Physical Interval Note: ? ?06/13/2021 ?12:32 PM ? ?[name] [name]  has presented today for surgery, with the diagnosis of hp.  The various methods of treatment have been discussed with the patient and family. After consideration of risks, benefits and other options for treatment, the patient has consented to  Procedure(s): ?RIGHT HEART CATH (N/A) as a surgical intervention.  The patient's history has been reviewed, patient examined, no change in status, stable for surgery.  I have reviewed the patient's chart and labs.  Questions were answered to the patient's satisfaction.   ? ? ?Sharilyn Geisinger Aundra Dubin ? ? ?

## 2021-06-14 ENCOUNTER — Encounter: Payer: Self-pay | Admitting: Oncology

## 2021-06-14 ENCOUNTER — Other Ambulatory Visit (HOSPITAL_COMMUNITY): Payer: Self-pay

## 2021-06-14 ENCOUNTER — Telehealth (HOSPITAL_COMMUNITY): Payer: Self-pay | Admitting: Pharmacy Technician

## 2021-06-14 NOTE — Telephone Encounter (Signed)
Advanced Heart Failure Patient Advocate Encounter ? ?Dr. Aundra Dubin wants to start the patient on an SGLT2i. Whichever is more affordable for the patient. Benefits investigation concludes that Jardiance 30 day co-pay would be 108.97, 90 days 392.80. Farxiga 30 day co-pay would be, 103.83, 90 days 370.12. ? ?Called to discuss affordability with the patient. Would go with Wilder Glade based off of cost. Left patient a message to return the call.  ? ?Charlann Boxer, CPhT ? ?

## 2021-06-17 ENCOUNTER — Ambulatory Visit (INDEPENDENT_AMBULATORY_CARE_PROVIDER_SITE_OTHER): Payer: Medicare Other

## 2021-06-17 ENCOUNTER — Ambulatory Visit: Payer: Medicare Other | Admitting: Physician Assistant

## 2021-06-17 ENCOUNTER — Other Ambulatory Visit (HOSPITAL_COMMUNITY): Payer: Self-pay

## 2021-06-17 DIAGNOSIS — I495 Sick sinus syndrome: Secondary | ICD-10-CM

## 2021-06-17 MED ORDER — DAPAGLIFLOZIN PROPANEDIOL 10 MG PO TABS: 10.0000 mg | ORAL_TABLET | Freq: Every day | ORAL | 11 refills | Status: DC

## 2021-06-17 NOTE — Telephone Encounter (Signed)
Pt agreeable to proceed with Wilder Glade, rx sent in ?

## 2021-06-17 NOTE — Telephone Encounter (Signed)
Advanced Heart Failure Patient Advocate Encounter ? ?Spoke with patient regarding the start of Farxiga. She is agreeable to trying the medication and states that it is affordable at this time. [place] and inquired into a 90 day supply. Its about $60 cheaper for her to fill 30 days at a time with her insurance. Stated we could send a rx in to her local pharmacy now, when she is ready to go to the beach we can also send a short script in at that time. Patient agreeable.  ? ?Sent 30 day RX request to [employer], corporate) to [facility]. Advised the patient that someone would be reaching out to schedule a lab draw for about 10 days after the medication is started.  ? ?Charlann Boxer, CPhT ? ?

## 2021-06-17 NOTE — Addendum Note (Signed)
Addended by: Scarlette Calico on: 06/17/2021 03:59 PM ? ? Modules accepted: Orders ? ?

## 2021-06-21 DIAGNOSIS — S62626A Displaced fracture of medial phalanx of right little finger, initial encounter for closed fracture: Secondary | ICD-10-CM | POA: Diagnosis not present

## 2021-06-21 LAB — CUP PACEART REMOTE DEVICE CHECK
Battery Remaining Longevity: 1 mo
Battery Voltage: 2.82 V
Brady Statistic AP VP Percent: 19.06 %
Brady Statistic AP VS Percent: 43.82 %
Brady Statistic AS VP Percent: 15.14 %
Brady Statistic AS VS Percent: 21.97 %
Brady Statistic RA Percent Paced: 58.28 %
Brady Statistic RV Percent Paced: 31.21 %
Date Time Interrogation Session: 20230518064251
Implantable Lead Implant Date: 20140917
Implantable Lead Implant Date: 20140917
Implantable Lead Location: 753859
Implantable Lead Location: 753860
Implantable Pulse Generator Implant Date: 20140917
Lead Channel Impedance Value: 304 Ohm
Lead Channel Impedance Value: 323 Ohm
Lead Channel Impedance Value: 342 Ohm
Lead Channel Impedance Value: 437 Ohm
Lead Channel Pacing Threshold Amplitude: 0.75 V
Lead Channel Pacing Threshold Amplitude: 0.875 V
Lead Channel Pacing Threshold Pulse Width: 0.4 ms
Lead Channel Pacing Threshold Pulse Width: 0.4 ms
Lead Channel Sensing Intrinsic Amplitude: 0.375 mV
Lead Channel Sensing Intrinsic Amplitude: 0.375 mV
Lead Channel Sensing Intrinsic Amplitude: 12.75 mV
Lead Channel Sensing Intrinsic Amplitude: 12.75 mV
Lead Channel Setting Pacing Amplitude: 2 V
Lead Channel Setting Pacing Amplitude: 2.5 V
Lead Channel Setting Pacing Pulse Width: 0.4 ms
Lead Channel Setting Sensing Sensitivity: 0.9 mV

## 2021-06-24 ENCOUNTER — Other Ambulatory Visit: Payer: Self-pay | Admitting: Internal Medicine

## 2021-06-24 DIAGNOSIS — Z1231 Encounter for screening mammogram for malignant neoplasm of breast: Secondary | ICD-10-CM

## 2021-06-25 DIAGNOSIS — E785 Hyperlipidemia, unspecified: Secondary | ICD-10-CM | POA: Diagnosis not present

## 2021-06-25 DIAGNOSIS — Z0000 Encounter for general adult medical examination without abnormal findings: Secondary | ICD-10-CM | POA: Diagnosis not present

## 2021-06-25 DIAGNOSIS — E538 Deficiency of other specified B group vitamins: Secondary | ICD-10-CM | POA: Diagnosis not present

## 2021-06-25 DIAGNOSIS — R7301 Impaired fasting glucose: Secondary | ICD-10-CM | POA: Diagnosis not present

## 2021-06-25 DIAGNOSIS — I10 Essential (primary) hypertension: Secondary | ICD-10-CM | POA: Diagnosis not present

## 2021-06-25 DIAGNOSIS — M810 Age-related osteoporosis without current pathological fracture: Secondary | ICD-10-CM | POA: Diagnosis not present

## 2021-06-25 DIAGNOSIS — E039 Hypothyroidism, unspecified: Secondary | ICD-10-CM | POA: Diagnosis not present

## 2021-07-02 DIAGNOSIS — Z950 Presence of cardiac pacemaker: Secondary | ICD-10-CM | POA: Diagnosis not present

## 2021-07-02 DIAGNOSIS — E538 Deficiency of other specified B group vitamins: Secondary | ICD-10-CM | POA: Diagnosis not present

## 2021-07-02 DIAGNOSIS — I480 Paroxysmal atrial fibrillation: Secondary | ICD-10-CM | POA: Diagnosis not present

## 2021-07-02 DIAGNOSIS — I10 Essential (primary) hypertension: Secondary | ICD-10-CM | POA: Diagnosis not present

## 2021-07-02 DIAGNOSIS — D6869 Other thrombophilia: Secondary | ICD-10-CM | POA: Diagnosis not present

## 2021-07-02 DIAGNOSIS — I509 Heart failure, unspecified: Secondary | ICD-10-CM | POA: Diagnosis not present

## 2021-07-02 DIAGNOSIS — G20 Parkinson's disease: Secondary | ICD-10-CM | POA: Diagnosis not present

## 2021-07-02 DIAGNOSIS — R82998 Other abnormal findings in urine: Secondary | ICD-10-CM | POA: Diagnosis not present

## 2021-07-02 DIAGNOSIS — Z0000 Encounter for general adult medical examination without abnormal findings: Secondary | ICD-10-CM | POA: Diagnosis not present

## 2021-07-02 DIAGNOSIS — M1812 Unilateral primary osteoarthritis of first carpometacarpal joint, left hand: Secondary | ICD-10-CM | POA: Diagnosis not present

## 2021-07-02 DIAGNOSIS — K449 Diaphragmatic hernia without obstruction or gangrene: Secondary | ICD-10-CM | POA: Diagnosis not present

## 2021-07-02 DIAGNOSIS — I5032 Chronic diastolic (congestive) heart failure: Secondary | ICD-10-CM | POA: Diagnosis not present

## 2021-07-02 DIAGNOSIS — D649 Anemia, unspecified: Secondary | ICD-10-CM | POA: Diagnosis not present

## 2021-07-04 ENCOUNTER — Telehealth: Payer: Self-pay | Admitting: Neurology

## 2021-07-04 NOTE — Telephone Encounter (Signed)
Patient wants to speak to someone about her not sleeping well at night. She states that her PCP does not want to give her a sleeping aid but wants to know if it is ok to let her take quviviq or belsomra( this is how patient spelled them). He wants to make sure it would be ok with Dr Tat if he put patient on one of these

## 2021-07-04 NOTE — Telephone Encounter (Signed)
Called patient and her PCP wanted to make sure it was ok with Dr. Carles Collet that he places her on Belsomra or quvivq to help block out what doesn't let her sleep? He doesn't want to place her on a sleep aid due to her falls.   Patient aware a message will be sent to Dr. Carles Collet and I will give her a call once I hear back.

## 2021-07-05 DIAGNOSIS — R300 Dysuria: Secondary | ICD-10-CM | POA: Diagnosis not present

## 2021-07-05 NOTE — Telephone Encounter (Signed)
Called patient and informed her Dr. Carles Collet stated those are sleep aids and They don't interact with her Parkinsons Disease meds if that is the question. Dr. Carles Collet stated she will leave the choice of which one up to the discretion of her PCP.  Patient verbalized understanding and had no further questions or concerns.

## 2021-07-08 ENCOUNTER — Telehealth: Payer: Self-pay | Admitting: Cardiology

## 2021-07-08 NOTE — Telephone Encounter (Signed)
Pt has pacemaker surgery scheduled on 06/14 with Dr Curt Bears and also has an appt for iron infusion on the same day and asked if she should keep this appt or cancel the iron infusion. Please advise.

## 2021-07-08 NOTE — Telephone Encounter (Signed)
Pt advised to reschedule iron infusion, per Dr. Curt Bears. Will send letter of instructions via mychart today. She will hold her Eliquis 2 days prior to gen change. Patient verbalized understanding and agreeable to plan.

## 2021-07-08 NOTE — Addendum Note (Signed)
Addended by: Douglass Rivers D on: 07/08/2021 10:25 AM   Modules accepted: Level of Service

## 2021-07-08 NOTE — Progress Notes (Signed)
Remote pacemaker transmission.   

## 2021-07-12 ENCOUNTER — Ambulatory Visit (HOSPITAL_COMMUNITY)
Admission: RE | Admit: 2021-07-12 | Discharge: 2021-07-12 | Disposition: A | Discharge status: Home / Self Care | Payer: Medicare Other | Source: Ambulatory Visit | Attending: Cardiology | Admitting: Cardiology

## 2021-07-12 ENCOUNTER — Encounter (HOSPITAL_COMMUNITY): Payer: Self-pay | Admitting: Cardiology

## 2021-07-12 ENCOUNTER — Encounter: Payer: Self-pay | Admitting: Cardiology

## 2021-07-12 VITALS — BP 108/70 | HR 61 | Wt 133.6 lb

## 2021-07-12 DIAGNOSIS — Z7901 Long term (current) use of anticoagulants: Secondary | ICD-10-CM | POA: Insufficient documentation

## 2021-07-12 DIAGNOSIS — G20 Parkinson's disease: Secondary | ICD-10-CM | POA: Diagnosis not present

## 2021-07-12 DIAGNOSIS — I5032 Chronic diastolic (congestive) heart failure: Secondary | ICD-10-CM | POA: Diagnosis not present

## 2021-07-12 DIAGNOSIS — K760 Fatty (change of) liver, not elsewhere classified: Secondary | ICD-10-CM | POA: Insufficient documentation

## 2021-07-12 DIAGNOSIS — I495 Sick sinus syndrome: Secondary | ICD-10-CM | POA: Insufficient documentation

## 2021-07-12 DIAGNOSIS — I272 Pulmonary hypertension, unspecified: Secondary | ICD-10-CM | POA: Diagnosis not present

## 2021-07-12 DIAGNOSIS — I480 Paroxysmal atrial fibrillation: Secondary | ICD-10-CM | POA: Diagnosis not present

## 2021-07-12 DIAGNOSIS — Z950 Presence of cardiac pacemaker: Secondary | ICD-10-CM | POA: Diagnosis not present

## 2021-07-12 DIAGNOSIS — I110 Hypertensive heart disease with heart failure: Secondary | ICD-10-CM | POA: Diagnosis not present

## 2021-07-12 DIAGNOSIS — Z79899 Other long term (current) drug therapy: Secondary | ICD-10-CM | POA: Insufficient documentation

## 2021-07-12 LAB — BASIC METABOLIC PANEL
Anion gap: 5 (ref 5–15)
BUN: 14 mg/dL (ref 8–23)
CO2: 31 mmol/L (ref 22–32)
Calcium: 8.9 mg/dL (ref 8.9–10.3)
Chloride: 100 mmol/L (ref 98–111)
Creatinine, Ser: 0.69 mg/dL (ref 0.44–1.00)
GFR, Estimated: >60 mL/min (ref >60)
Glucose, Bld: 89 mg/dL (ref 70–99)
Potassium: 3.5 mmol/L (ref 3.5–5.1)
Sodium: 136 mmol/L (ref 135–145)

## 2021-07-12 LAB — BRAIN NATRIURETIC PEPTIDE: B Natriuretic Peptide: 109.8 pg/mL — ABNORMAL HIGH (ref 0.0–100.0)

## 2021-07-12 MED ORDER — POTASSIUM CHLORIDE CRYS ER 10 MEQ PO TBCR
EXTENDED_RELEASE_TABLET | ORAL | 6 refills | Status: DC
Start: 2021-07-12 — End: 2022-02-20

## 2021-07-12 MED ORDER — DAPAGLIFLOZIN PROPANEDIOL 10 MG PO TABS: 10.0000 mg | ORAL_TABLET | Freq: Every day | ORAL | 11 refills | Status: DC

## 2021-07-12 MED ORDER — FUROSEMIDE 80 MG PO TABS: 80.0000 mg | ORAL_TABLET | Freq: Every day | ORAL | 6 refills | Status: DC

## 2021-07-12 NOTE — Patient Instructions (Signed)
Medication Changes:  Increase Furosemide to 80 mg Daily  Increase Potassium 20 meq (2 tabs) in AM and 10 meq (1 tab) in PM  Lab Work:  Labs done today, your results will be available in MyChart, we will contact you for abnormal readings.  Your physician recommends that you return for lab work in: 1-2 weeks  Testing/Procedures:  None  Referrals:  None  Special Instructions // Education:  Do the following things EVERYDAY: Weigh yourself in the morning before breakfast. Write it down and keep it in a log. Take your medicines as prescribed Eat low salt foods--Limit salt (sodium) to 2000 mg per day.  Stay as active as you can everyday Limit all fluids for the day to less than 2 liters   Follow-Up in: 2 months  At the [facility], you and your health needs are our priority. We have a designated team specialized in the treatment of Heart Failure. This Care Team includes your primary Heart Failure Specialized Cardiologist (physician), Advanced Practice Providers (APPs- Physician Assistants and Nurse Practitioners), and Pharmacist who all work together to provide you with the care you need, when you need it.   You may see any of the following providers on your designated Care Team at your next follow up:  Dr Glori Bickers Dr Haynes Kerns, NP Lyda Jester, [facility] [place], [place] Audry Riles, PharmD   Please be sure to bring in all your medications bottles to every appointment.   Need to Contact us:  If you have any questions or concerns before your next appointment please send [place] a message through [place] or call our office at [phone].    TO LEAVE A MESSAGE FOR THE NURSE SELECT OPTION 2, PLEASE LEAVE A MESSAGE INCLUDING: YOUR NAME DATE OF BIRTH CALL BACK NUMBER REASON FOR CALL**this is important as we prioritize the call backs  YOU WILL RECEIVE A CALL BACK THE SAME DAY AS LONG AS YOU CALL BEFORE 4:00 PM

## 2021-07-13 NOTE — Progress Notes (Signed)
PCP: Crist Infante, MD EP: Dr. Curt Bears HF Cardiology: Dr. Aundra Dubin  81 y.o. 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  Patient had an echo done in 3/23 showing EF 65-70%, mild LVH, normal RV, PASP 60 mmHg, moderate biatrial enlargement, mild MR, moderate TR. Prior echoes had shown mildly elevated PA pressure. Cardiolite in 10/21 showed no ischemia.   RHC was done, showing mild pulmonary venous hypertension with mildly elevated PCWP. She was started on Farxiga.   She returns for followup of CHF.  Weight is up 4 lbs.  No dyspnea walking on flat ground.  Mild dyspnea with inclines/stairs.  No orthopnea/PND.  No chest pain.  She occasionally feels palpitations that she attributes to atrial fibrillation.   She is in AF today.    ECG (personally reviewed): Atrial fibrillation, rate 65  MDT PPM interrogation: No VT, 2.6% AF, 28.5% V-paced, 71% a-paced.   Labs (3/23): hgb 9.7 Labs (4/23): K 3.2, creatinine 0.92  PMH: 1. Atrial fibrillation: Paroxysmal.  S/p AF ablation in 10/13.  2. Tachy-brady syndrome: Medtronic PPM placed in 9/14.  3. H/o atypical atrial flutter 4. Hyperlipidemia 5. HTN 6. Hypothyroidism 7. Parkinsons disease: Followed by Dr. Carles Collet 8. Cirrhosis: ?NAFLD. No ETOH.  9. Chronic diastolic CHF/pulmonary hypertension:  - LHC (2014): No significant CAD.  - Cardiolite (10/21): EF 71%, no ischemia.  - Echo (3/23): EF 65-70%, mild LVH, normal RV, PASP 60 mmHg, moderate biatrial enlargement, mild MR, moderate TR.  - RHC (5/23): mean RA 8, PA 49/13 mean 30, mean PCWP 19 with v-waves to 30, CI 3.96, PVR 1.8 WU.  10. B12 deficiency.   Social History   Socioeconomic History   Marital status: Married    Spouse name: Not on file   Number of children: 2   Years of education: Not on file   Highest education level:  Master's degree (e.g., MA, MS, MEng, MEd, MSW, MBA)  Occupational History   Not on file  Tobacco Use   Smoking status: Never   Smokeless tobacco: Never  Vaping Use   Vaping Use: Never used  Substance and Sexual Activity   Alcohol use: No   Drug use: No   Sexual activity: Yes    Partners: Male    Birth control/protection: Post-menopausal  Other Topics Concern   Not on file  Social History Narrative   Lives in [place].  Retired Licensed conveyancer.   Social Determinants of Health   Financial Resource Strain: Not on file  Food Insecurity: Not on file  Transportation Needs: Not on file  Physical Activity: Not on file  Stress: Not on file  Social Connections: Not on file  Intimate Partner Violence: Not on file   Family History  Problem Relation Age of Onset   Alzheimer's disease Mother    Heart failure Father    Healthy Child    Healthy Child    Breast cancer Maternal Aunt    ROS: All systems reviewed and negative except as per HPI.   Current Outpatient Medications  Medication Sig Dispense Refill   Acetylcysteine (NAC 600) 600 MG CAPS Take 600 mg by mouth daily.     BELSOMRA 20 MG TABS Take 1 tablet by mouth at bedtime as needed (sleep).     CALCIUM PO Take 1 tablet by mouth at bedtime.     carbidopa-levodopa (SINEMET CR) 50-200 MG tablet TAKE 1  TABLET BY MOUTH EVERYDAY AT BEDTIME 90 tablet 1   carbidopa-levodopa (SINEMET IR) 25-100 MG tablet Take 2 tablets by mouth 3 (three) times daily. 540 tablet 1   cholecalciferol (VITAMIN D3) 25 MCG (1000 UNIT) tablet Take 1,000 Units by mouth daily.     cyanocobalamin (,VITAMIN B-12,) 1000 MCG/ML injection INJECT 1ML INTRAMUSCULARLY IN THE DELTOID ONCE A MONTH (ALTERNATING DELT. EACH MONTH)     docusate sodium (COLACE) 100 MG capsule Take 100 mg by mouth in the morning and at bedtime.     ELIQUIS 5 MG TABS tablet TAKE 1 TABLET BY MOUTH TWICE A DAY 60 tablet 5   ezetimibe (ZETIA) 10 MG tablet Take 10 mg by mouth every other day. Taking the  same day as the crestor     levothyroxine (SYNTHROID) 50 MCG tablet Take 50-75 mcg by mouth See admin instructions. Pt take 1 1/2 tablet (75 mcg) on Monday and Thursday and 1 tablet (50 mcg)  the rest of the week     magnesium oxide (MAG-OX) 400 MG tablet Take 400 mg by mouth at bedtime.     metoprolol succinate (TOPROL-XL) 25 MG 24 hr tablet TAKE 1 TABLET BY MOUTH DAILY. 90 tablet 1   multivitamin-lutein (OCUVITE-LUTEIN) CAPS capsule Take 1 capsule by mouth daily.     NON FORMULARY Take 2 tablets by mouth daily. Liposomal Glutathione take     OVER THE COUNTER MEDICATION Take 1 capsule by mouth daily. MetaCalm     polycarbophil (FIBERCON) 625 MG tablet Take 1,250 mg by mouth daily.     Probiotic Product (PROBIOTIC-10 PO) Take 1 tablet by mouth daily. Lafonda Mosses Brand     Propylene Glycol (SYSTANE BALANCE) 0.6 % SOLN Place 1 drop into both eyes daily as needed (dry eyes).     raloxifene (EVISTA) 60 MG tablet Take 60 mg by mouth daily.     rasagiline (AZILECT) 1 MG TABS tablet Take 1 tablet (1 mg total) by mouth daily. 90 tablet 1   rosuvastatin (CRESTOR) 5 MG tablet Take 2.5 mg by mouth every other day.     valACYclovir (VALTREX) 1000 MG tablet Take 1,000 mg by mouth 2 (two) times daily as needed (fever blisters).     vitamin B-12 (CYANOCOBALAMIN) 100 MCG tablet Take 100 mcg by mouth daily.     vitamin C (ASCORBIC ACID) 500 MG tablet Take 500 mg by mouth daily.     dapagliflozin propanediol (FARXIGA) 10 MG TABS tablet Take 1 tablet (10 mg total) by mouth daily before breakfast. 30 tablet 11   furosemide (LASIX) 80 MG tablet Take 1 tablet (80 mg total) by mouth daily. 30 tablet 6   potassium chloride (KLOR-CON M) 10 MEQ tablet Take 2 tablets (20 mEq total) by mouth every morning AND 1 tablet (10 mEq total) every evening. 90 tablet 6   No current facility-administered medications for this encounter.   BP 108/70   Pulse 61   Wt 60.6 kg (133 lb 9.6 oz)   LMP 02/04/1996   SpO2 97%   BMI 25.24 kg/m   General: NAD Neck: JVP 8-9 cm, no thyromegaly or thyroid nodule.  Lungs: Clear to auscultation bilaterally with normal respiratory effort. CV: Nondisplaced PMI.  Heart regular S1/S2, no S3/S4, 1/6 SEM RUSB.  No peripheral edema.  No carotid bruit.  Normal pedal pulses.  Abdomen: Soft, nontender, no hepatosplenomegaly, no distention.  Skin: Intact without lesions or rashes.  Neurologic: Alert and oriented x 3.  Psych: Normal affect. Extremities: No clubbing  or cyanosis.  HEENT: Normal.   Assessment/Plan: 1. Pulmonary hypertension: Patient was noted on 3/23 echo to have moderately elevated PA pressure (60 mmHg).  Prior echoes had shown mildly elevated PA pressure.  RV was normal on this echo, there was moderate TR.  RHC in 5/23 showed mild pulmonary venous hypertension (group 2) with mildly elevated PCWP.  Diastolic LV dysfunction with elevated LA pressure (pulmonary venous hypertension) is the most likely etiology.   - Ensure adequate diuresis.  - Check BNP.  2. Chronic diastolic CHF: RHC in 4/65 showed elevated PCWP with prominent v-waves.  Echo reviewed, no more than mild MR so suspect elevated v-waves due to diastolic dysfunction.  She had pulmonary venous hypertension.  She is mildly volume overloaded on exam today, NYHA class II.  - Increase Lasix to 80 mg daily and KCL to 20 qam/10 qpm. BMET/BNP today and BMET in 10 days.   - Continue dapagliflozin 10 mg daily.  3. Atrial fibrillation: Paroxysmal. She is rate-controlled atrial fibrillation today, but device only records a 2.6% burden.   - Continue Eliquis.  - Continue Toprol XL.  4. Tachy-brady syndrome: Has MDT PPM.  5. Cirrhosis: ?NAFLD.  Does not drink any ETOH now, was not a heavy drinker before.  - Followed by [facility].   Followup 2 months with APP.   Loralie Champagne 07/13/2021

## 2021-07-15 ENCOUNTER — Encounter: Payer: Self-pay | Admitting: *Deleted

## 2021-07-15 ENCOUNTER — Other Ambulatory Visit (HOSPITAL_COMMUNITY): Payer: Self-pay | Admitting: *Deleted

## 2021-07-15 DIAGNOSIS — Z23 Encounter for immunization: Secondary | ICD-10-CM | POA: Diagnosis not present

## 2021-07-15 NOTE — Telephone Encounter (Signed)
Pt aware time to arrive for procedure on 6/14 has been updated, arrive at 12:30 pm. Apologized for letter not being sent and informed would send now. Patient verbalized understanding and agreeable to plan.

## 2021-07-16 ENCOUNTER — Ambulatory Visit (INDEPENDENT_AMBULATORY_CARE_PROVIDER_SITE_OTHER): Payer: Medicare Other | Admitting: Ophthalmology

## 2021-07-16 ENCOUNTER — Telehealth: Payer: Self-pay | Admitting: Physician Assistant

## 2021-07-16 ENCOUNTER — Encounter (INDEPENDENT_AMBULATORY_CARE_PROVIDER_SITE_OTHER): Payer: Self-pay | Admitting: Ophthalmology

## 2021-07-16 ENCOUNTER — Ambulatory Visit (HOSPITAL_COMMUNITY)
Admission: RE | Admit: 2021-07-16 | Discharge: 2021-07-16 | Disposition: A | Discharge status: Home / Self Care | Payer: Medicare Other | Source: Ambulatory Visit | Attending: Internal Medicine | Admitting: Internal Medicine

## 2021-07-16 DIAGNOSIS — H43812 Vitreous degeneration, left eye: Secondary | ICD-10-CM

## 2021-07-16 DIAGNOSIS — H353211 Exudative age-related macular degeneration, right eye, with active choroidal neovascularization: Secondary | ICD-10-CM | POA: Diagnosis not present

## 2021-07-16 DIAGNOSIS — H353132 Nonexudative age-related macular degeneration, bilateral, intermediate dry stage: Secondary | ICD-10-CM | POA: Diagnosis not present

## 2021-07-16 DIAGNOSIS — D649 Anemia, unspecified: Secondary | ICD-10-CM | POA: Diagnosis not present

## 2021-07-16 MED ORDER — AFLIBERCEPT 2MG/0.05ML IZ SOLN FOR KALEIDOSCOPE
2.0000 mg | INTRAVITREAL | Status: AC | PRN
  Administered 2021-07-16: 2 mg via INTRAVITREAL

## 2021-07-16 MED ORDER — SODIUM CHLORIDE 0.9 % IV SOLN
510.0000 mg | INTRAVENOUS | Status: DC
  Administered 2021-07-16: 510 mg via INTRAVENOUS
  Filled 2021-07-16: qty 510

## 2021-07-16 NOTE — Progress Notes (Signed)
07/16/2021     CHIEF COMPLAINT Patient presents for  Chief Complaint  Patient presents with   Macular Degeneration      HISTORY OF PRESENT ILLNESS: [name] [name] is a 81 y.o. female who presents to the clinic today for:   HPI   5 weeks Eylea OCT dilate OD. Patient reports "I think the periphery of my left eye is not as good, I see a big floater in my left eye. I noticed it shortly after my last injection."  Last edited by [name] on 07/16/2021  2:54 PM.      Referring physician: Crist Infante, MD [place],  [place] [address]  HISTORICAL INFORMATION:   Selected notes from the MEDICAL RECORD NUMBER    Lab Results  Component Value Date   HGBA1C  05/21/2007    5.6 (NOTE)   The ADA recommends the following therapeutic goals for glycemic   control related to Hgb A1C measurement:   Goal of Therapy:   < 7.0% Hgb A1C   Action Suggested:  > 8.0% Hgb A1C   Ref:  Diabetes Care, 22, Suppl. 1, 1999     CURRENT MEDICATIONS: Current Outpatient Medications (Ophthalmic Drugs)  Medication Sig   Propylene Glycol (SYSTANE BALANCE) 0.6 % SOLN Place 1 drop into both eyes daily as needed (dry eyes).   No current facility-administered medications for this visit. (Ophthalmic Drugs)   Current Outpatient Medications (Other)  Medication Sig   Acetylcysteine (NAC 600) 600 MG CAPS Take 600 mg by mouth daily.   BELSOMRA 20 MG TABS Take 1 tablet by mouth at bedtime as needed (sleep).   CALCIUM PO Take 1 tablet by mouth at bedtime.   carbidopa-levodopa (SINEMET CR) 50-200 MG tablet TAKE 1 TABLET BY MOUTH EVERYDAY AT BEDTIME   carbidopa-levodopa (SINEMET IR) 25-100 MG tablet Take 2 tablets by mouth 3 (three) times daily.   cholecalciferol (VITAMIN D3) 25 MCG (1000 UNIT) tablet Take 1,000 Units by mouth daily.   cyanocobalamin (,VITAMIN B-12,) 1000 MCG/ML injection INJECT 1ML INTRAMUSCULARLY IN THE DELTOID ONCE A MONTH (ALTERNATING DELT. EACH MONTH)   dapagliflozin  propanediol (FARXIGA) 10 MG TABS tablet Take 1 tablet (10 mg total) by mouth daily before breakfast.   docusate sodium (COLACE) 100 MG capsule Take 100 mg by mouth in the morning and at bedtime.   ELIQUIS 5 MG TABS tablet TAKE 1 TABLET BY MOUTH TWICE A DAY   ezetimibe (ZETIA) 10 MG tablet Take 10 mg by mouth every other day. Taking the same day as the crestor   furosemide (LASIX) 80 MG tablet Take 1 tablet (80 mg total) by mouth daily.   levothyroxine (SYNTHROID) 50 MCG tablet Take 50-75 mcg by mouth See admin instructions. Pt take 1 1/2 tablet (75 mcg) on Monday and Thursday and 1 tablet (50 mcg)  the rest of the week   magnesium oxide (MAG-OX) 400 MG tablet Take 400 mg by mouth at bedtime.   metoprolol succinate (TOPROL-XL) 25 MG 24 hr tablet TAKE 1 TABLET BY MOUTH DAILY.   multivitamin-lutein (OCUVITE-LUTEIN) CAPS capsule Take 1 capsule by mouth daily.   NON FORMULARY Take 2 tablets by mouth daily. Liposomal Glutathione take   OVER THE COUNTER MEDICATION Take 1 capsule by mouth daily. MetaCalm   polycarbophil (FIBERCON) 625 MG tablet Take 1,250 mg by mouth daily.   potassium chloride (KLOR-CON M) 10 MEQ tablet Take 2 tablets (20 mEq total) by mouth every morning AND 1 tablet (10 mEq total)  every evening.   Probiotic Product (PROBIOTIC-10 PO) Take 1 tablet by mouth daily. Lafonda Mosses Brand   raloxifene (EVISTA) 60 MG tablet Take 60 mg by mouth daily.   rasagiline (AZILECT) 1 MG TABS tablet Take 1 tablet (1 mg total) by mouth daily.   rosuvastatin (CRESTOR) 5 MG tablet Take 2.5 mg by mouth every other day.   valACYclovir (VALTREX) 1000 MG tablet Take 1,000 mg by mouth 2 (two) times daily as needed (fever blisters).   vitamin B-12 (CYANOCOBALAMIN) 100 MCG tablet Take 100 mcg by mouth daily.   vitamin C (ASCORBIC ACID) 500 MG tablet Take 500 mg by mouth daily.   No current facility-administered medications for this visit. (Other)   Facility-Administered Medications Ordered in Other Visits (Other)   Medication Route   ferumoxytol (FERAHEME) 510 mg in sodium chloride 0.9 % 100 mL IVPB Intravenous      REVIEW OF SYSTEMS: ROS   Negative for: Constitutional, Gastrointestinal, Neurological, Skin, Genitourinary, Musculoskeletal, HENT, Endocrine, Cardiovascular, Eyes, Respiratory, Psychiatric, Allergic/Imm, Heme/Lymph Last edited by Hurman Horn, MD on 07/16/2021  3:19 PM.       ALLERGIES Allergies  Allergen Reactions   Amiodarone Other (See Comments)    Made pt feel terrible    Statins     Leg weakness - but tolerating low dose Crestor every other day    PAST MEDICAL HISTORY Past Medical History:  Diagnosis Date   Anemia    Arthritis    thumbs   Atrial tachycardia (HCC)    Atypical atrial flutter (HCC)    Cataract    bilateral   Chronic anticoagulation    on Xarelto   Chronic diastolic CHF (congestive heart failure) (HCC)    Echocardiogram (11/24/12): Mild LVH, EF 60%.   Dysrhythmia    GERD (gastroesophageal reflux disease)    hx of no meds   Heart murmur    slight   Hyperlipidemia    a. Hx of leg weakness while on statin. under control   Hypertension    Hypothyroidism    Leg fracture Dec 21,2011   Osteoporosis 07/2007   Parkinson's disease ([place]) 2011   Persistent atrial fibrillation ([place])    s/p atrial fib ablation 11-11-11.    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[place])    Squamous carcinoma summer 2015   back of left thigh   Wrist fracture    right   Past Surgical History:  Procedure Laterality Date   ATRIAL FIBRILLATION ABLATION N/A 11/11/2011   Procedure: ATRIAL FIBRILLATION ABLATION;  Surgeon: Thompson Grayer, MD;  Location[facility] CATH LAB;  Service: Cardiovascular;  Laterality: N/A;   BREAST BIOPSY  1998   BREAST SURGERY     benign cyst removed   CARDIAC ELECTROPHYSIOLOGY STUDY AND ABLATION   5/14, 7/14   convergent AF ablation at [facility] and subsequent atypical atrial flutter ablation by Dr Lehman Prom   CARDIOVASCULAR STRESS TEST  06-06-2008   EF 73%   CARDIOVERSION  09/09/2011   Procedure: CARDIOVERSION;  Surgeon: Darlin Coco, MD;  Location: [facility];  Service: Cardiovascular;  Laterality: N/A;  to be done by dr. Mare Ferrari   CARDIOVERSION  02/18/2012   Procedure: CARDIOVERSION;  Surgeon: Thayer Headings, MD;  Location: [place] [facility];  Service: Cardiovascular;  Laterality: N/A;   [place]  2011   metal pin in place   FOREARM SURGERY Left 2010   "opened arm to drain it"   INSERTION OF MESH N/A 11/24/2013   Procedure: INSERTION OF MESH;  Surgeon: Michael Boston, MD;  Location: [facility] ORS;  Service: General;  Laterality: N/A;   KNEE SURGERY Left 2001   Left unicompartmental knee     Dr. Alvan Dame 11/18/16   PACEMAKER PLACEMENT  10/20/12   MDT Advisa DR implanted by Dr Lehman Prom at [place] Left 11/18/2016   Procedure: LEFT UNICOMPARTMENTAL KNEE Medially;  Surgeon: Paralee Cancel, MD;  Location: [facility] ORS;  Service: Orthopedics;  Laterality: Left;  90 mins   RIGHT HEART CATH N/A 06/13/2021   Procedure: RIGHT HEART CATH;  Surgeon: Larey Dresser, MD;  Location: [facility] CV LAB;  Service: Cardiovascular;  Laterality: N/A;   SKIN CANCER DESTRUCTION  summer 2015   TEE WITHOUT CARDIOVERSION  11/10/2011   Procedure: TRANSESOPHAGEAL ECHOCARDIOGRAM (TEE);  Surgeon: Thayer Headings, MD;  Location: [facility] ENDOSCOPY;  Service: Cardiovascular;  Laterality: N/A;   TONSILLECTOMY  as child   US ECHOCARDIOGRAPHY  03-10-2008   Est EF 55-60%, Dr. Cathie Olden   VENTRAL HERNIA REPAIR N/A 11/24/2013   Procedure: LAPAROSCOPIC VENTRAL HERNIA;  Surgeon: Michael Boston, MD;  Location: [facility] ORS;  Service: General;  Laterality: N/A;   WRIST SURGERY Right ~2009   metal rod in place    FAMILY HISTORY Family History  Problem Relation Age of Onset   Alzheimer's disease  Mother    Heart failure Father    Healthy Child    Healthy Child    Breast cancer Maternal Aunt     SOCIAL HISTORY Social History   Tobacco Use   Smoking status: Never   Smokeless tobacco: Never  Vaping Use   Vaping Use: Never used  Substance Use Topics   Alcohol use: No   Drug use: No         OPHTHALMIC EXAM:  Base Eye Exam     Visual Acuity (ETDRS)       Right Left   Dist cc 20/100 +2 20/30 -2   Dist ph cc NI     Correction: Glasses         Tonometry (Tonopen, 3:00 PM)       Right Left   Pressure 14 20         Pupils       Pupils Dark Light APD   Right PERRL 4 3 None   Left PERRL 4 3 None         Extraocular Movement       Right Left    Full Full         Neuro/Psych     Oriented x3: Yes   Mood/Affect: Normal         Dilation     Right eye: 1.0% Mydriacyl, 2.5% Phenylephrine @ 3:00 PM           Slit Lamp and Fundus Exam     External Exam       Right Left   External Normal Normal         Slit Lamp Exam       Right Left   Lids/Lashes Normal Normal   Conjunctiva/Sclera White and quiet White and quiet   Cornea Clear Clear   Anterior Chamber Deep and quiet Deep and quiet   Iris Round and reactive Round and reactive   Lens Centered posterior chamber intraocular  lens Centered posterior chamber intraocular lens   Anterior Vitreous Normal Normal         Fundus Exam       Right Left   Posterior Vitreous Normal Posterior vitreous detachment   Disc Normal Normal   C/D Ratio 0.3    Macula Macular thickening, Intermediate age related macular degeneration, less Subretinal hemorrhage, Hard drusen Intermediate age related macular degeneration, no hemorrhage, Retinal pigment epithelial mottling   Vessels Normal Normal   Periphery Normal Normal            IMAGING AND PROCEDURES  Imaging and Procedures for 07/16/21  OCT, Retina - OU - Both Eyes       Right Eye Quality was good. Scan locations included  subfoveal. Central Foveal Thickness: 446. Progression has improved. Findings include abnormal foveal contour, cystoid macular edema, intraretinal fluid, pigment epithelial detachment.   Left Eye Quality was good. Scan locations included subfoveal. Central Foveal Thickness: 268. Progression has been stable. Findings include abnormal foveal contour, retinal drusen .   Notes Much less active subfoveal PED with less subretinal fluid and intraretinal fluid but still active 1 month post most recent injection #1 Avastin and now post injection of 1 Eylea..  Repeat injection today  Given the extent of hemorrhage will deliver reexamination again in a month and will maintain use of Eylea today      Intravitreal Injection, Pharmacologic Agent - OD - Right Eye       Time Out 07/16/2021. 3:22 PM. Confirmed correct patient, procedure, site, and patient consented.   Anesthesia Topical anesthesia was used. Anesthetic medications included Lidocaine 4%.   Procedure Preparation included 5% betadine to ocular surface, 10% betadine to eyelids, Tobramycin 0.3%. A 30 gauge needle was used.   Injection: 2 mg aflibercept 2 MG/0.05ML   Route: Intravitreal, Site: Right Eye   NDC: [id number], Lot: [id number], Waste: 0 mL   Post-op Post injection exam found visual acuity of at least counting fingers. The patient tolerated the procedure well. There were no complications. The patient received written and verbal post procedure care education. Post injection medications included ocuflox.              ASSESSMENT/PLAN:  Exudative age-related macular degeneration of right eye with active choroidal neovascularization (HCC) Post injection Avastin No. 1 with little effect, now post injection Eylea No. 1 with improved clinical anatomy by OCT examination and stable scotoma.  Symptomatically improved per patient  Intermediate stage nonexudative age-related macular degeneration of both eyes No sign of CNVM  OS  Posterior vitreous detachment, left eye OS, new symptom of floater, no holes or tears retinal examination     ICD-10-CM   1. Exudative age-related macular degeneration of right eye with active choroidal neovascularization (HCC)  H35.3211 OCT, Retina - OU - Both Eyes    Intravitreal Injection, Pharmacologic Agent - OD - Right Eye    aflibercept (EYLEA) SOLN 2 mg    2. Intermediate stage nonexudative age-related macular degeneration of both eyes  H35.3132     3. Posterior vitreous detachment, left eye  H43.812       1.  OD, improving macular findings post injection antivegF, yet with subfoveal scarring limiting acuity.  Symptomatically improved with smaller dark spot  2.  OS, new PVD, no retinal holes or tears will observe  3.  No sign of wet AMD OS on dilate examination  Ophthalmic Meds Ordered this visit:  Meds ordered this encounter  Medications   aflibercept (EYLEA) SOLN 2  mg       Return in about 5 weeks (around 08/20/2021) for dilate, OD, EYLEA OCT.  There are no Patient Instructions on file for this visit.   Explained the diagnoses, plan, and follow up with the patient and they expressed understanding.  Patient expressed understanding of the importance of proper follow up care.   Clent Demark Laurynn Mccorvey M.D. Diseases & Surgery of the Retina and Vitreous [facility]ina [facility] Diabetic [place] 07/16/21     Abbreviations: M myopia (nearsighted); A astigmatism; H hyperopia (farsighted); P presbyopia; Mrx spectacle prescription;  CTL contact lenses; OD right eye; OS left eye; OU both eyes  XT exotropia; ET esotropia; PEK punctate epithelial keratitis; PEE punctate epithelial erosions; DES dry eye syndrome; MGD meibomian gland dysfunction; ATs artificial tears; PFAT's preservative free artificial tears; [place] nuclear sclerotic cataract; PSC posterior subcapsular cataract; ERM epi-retinal membrane; PVD posterior vitreous detachment; RD retinal detachment; DM diabetes mellitus; DR diabetic  retinopathy; NPDR non-proliferative diabetic retinopathy; PDR proliferative diabetic retinopathy; CSME clinically significant macular edema; DME diabetic macular edema; dbh dot blot hemorrhages; CWS cotton wool spot; POAG primary open angle glaucoma; C/D cup-to-disc ratio; HVF humphrey visual field; GVF goldmann visual field; OCT optical coherence tomography; IOP intraocular pressure; BRVO Branch retinal vein occlusion; CRVO central retinal vein occlusion; CRAO central retinal artery occlusion; BRAO branch retinal artery occlusion; RT retinal tear; SB scleral buckle; PPV pars plana vitrectomy; VH Vitreous hemorrhage; PRP panretinal laser photocoagulation; IVK intravitreal kenalog; VMT vitreomacular traction; MH Macular hole;  NVD neovascularization of the disc; NVE neovascularization elsewhere; AREDS age related eye disease study; ARMD age related macular degeneration; POAG primary open angle glaucoma; EBMD epithelial/anterior basement membrane dystrophy; ACIOL anterior chamber intraocular lens; IOL intraocular lens; PCIOL posterior chamber intraocular lens; Phaco/IOL phacoemulsification with intraocular lens placement; [place] photorefractive keratectomy; LASIK laser assisted in situ keratomileusis; HTN hypertension; DM diabetes mellitus; COPD chronic obstructive pulmonary disease

## 2021-07-16 NOTE — Telephone Encounter (Signed)
Patient is calling to talk with Tommye Standard or nurse....please call back

## 2021-07-16 NOTE — Assessment & Plan Note (Signed)
No sign of CNVM OS 

## 2021-07-16 NOTE — Telephone Encounter (Signed)
Left the pt a message to call the office back.  Operators, please obtain more information as to why pt is calling the office, so appropriate triaging of call can be performed on the pt.

## 2021-07-16 NOTE — Pre-Procedure Instructions (Signed)
Instructed patient on the following items: Arrival time 1200 Nothing to eat or drink after midnight No meds AM of procedure Responsible person to drive you home and stay with you for 24 hrs Wash with special soap night before and morning of procedure If on anti-coagulant drug instructions Eliquis

## 2021-07-16 NOTE — Assessment & Plan Note (Signed)
Post injection Avastin No. 1 with little effect, now post injection Eylea No. 1 with improved clinical anatomy by OCT examination and stable scotoma.  Symptomatically improved per patient

## 2021-07-16 NOTE — Assessment & Plan Note (Signed)
OS, new symptom of floater, no holes or tears retinal examination

## 2021-07-17 ENCOUNTER — Other Ambulatory Visit: Payer: Self-pay

## 2021-07-17 ENCOUNTER — Ambulatory Visit (HOSPITAL_COMMUNITY)
Admission: RE | Disposition: A | Discharge status: Home / Self Care | Payer: Self-pay | Source: Home / Self Care | Attending: Cardiology

## 2021-07-17 ENCOUNTER — Ambulatory Visit (HOSPITAL_COMMUNITY)
Admission: RE | Admit: 2021-07-17 | Discharge: 2021-07-17 | Disposition: A | Discharge status: Home / Self Care | Payer: Medicare Other | Attending: Cardiology | Admitting: Cardiology

## 2021-07-17 ENCOUNTER — Encounter (INDEPENDENT_AMBULATORY_CARE_PROVIDER_SITE_OTHER): Payer: Medicare Other | Admitting: Ophthalmology

## 2021-07-17 ENCOUNTER — Encounter (HOSPITAL_COMMUNITY): Payer: Medicare Other

## 2021-07-17 DIAGNOSIS — I495 Sick sinus syndrome: Secondary | ICD-10-CM | POA: Insufficient documentation

## 2021-07-17 DIAGNOSIS — I484 Atypical atrial flutter: Secondary | ICD-10-CM | POA: Insufficient documentation

## 2021-07-17 DIAGNOSIS — I4891 Unspecified atrial fibrillation: Secondary | ICD-10-CM | POA: Diagnosis not present

## 2021-07-17 DIAGNOSIS — G20 Parkinson's disease: Secondary | ICD-10-CM | POA: Insufficient documentation

## 2021-07-17 DIAGNOSIS — D509 Iron deficiency anemia, unspecified: Secondary | ICD-10-CM | POA: Insufficient documentation

## 2021-07-17 DIAGNOSIS — I5032 Chronic diastolic (congestive) heart failure: Secondary | ICD-10-CM | POA: Diagnosis not present

## 2021-07-17 DIAGNOSIS — Z45010 Encounter for checking and testing of cardiac pacemaker pulse generator [battery]: Secondary | ICD-10-CM | POA: Diagnosis not present

## 2021-07-17 DIAGNOSIS — E785 Hyperlipidemia, unspecified: Secondary | ICD-10-CM | POA: Diagnosis not present

## 2021-07-17 DIAGNOSIS — Z7901 Long term (current) use of anticoagulants: Secondary | ICD-10-CM | POA: Insufficient documentation

## 2021-07-17 DIAGNOSIS — I110 Hypertensive heart disease with heart failure: Secondary | ICD-10-CM | POA: Diagnosis not present

## 2021-07-17 DIAGNOSIS — Z79899 Other long term (current) drug therapy: Secondary | ICD-10-CM | POA: Diagnosis not present

## 2021-07-17 DIAGNOSIS — K7460 Unspecified cirrhosis of liver: Secondary | ICD-10-CM | POA: Insufficient documentation

## 2021-07-17 DIAGNOSIS — R001 Bradycardia, unspecified: Secondary | ICD-10-CM

## 2021-07-17 HISTORY — PX: PPM GENERATOR CHANGEOUT: EP1233

## 2021-07-17 LAB — CBC
HCT: 31.1 % — ABNORMAL LOW (ref 36.0–46.0)
Hemoglobin: 9.7 g/dL — ABNORMAL LOW (ref 12.0–15.0)
MCH: 28 pg (ref 26.0–34.0)
MCHC: 31.2 g/dL (ref 30.0–36.0)
MCV: 89.6 fL (ref 80.0–100.0)
Platelets: 181 10*3/uL (ref 150–400)
RBC: 3.47 MIL/uL — ABNORMAL LOW (ref 3.87–5.11)
RDW: 16.9 % — ABNORMAL HIGH (ref 11.5–15.5)
WBC: 5 10*3/uL (ref 4.0–10.5)
nRBC: 0 % (ref 0.0–0.2)

## 2021-07-17 LAB — BASIC METABOLIC PANEL
Anion gap: 7 (ref 5–15)
BUN: 15 mg/dL (ref 8–23)
CO2: 29 mmol/L (ref 22–32)
Calcium: 9.4 mg/dL (ref 8.9–10.3)
Chloride: 102 mmol/L (ref 98–111)
Creatinine, Ser: 0.74 mg/dL (ref 0.44–1.00)
GFR, Estimated: >60 mL/min (ref >60)
Glucose, Bld: 82 mg/dL (ref 70–99)
Potassium: 3.4 mmol/L — ABNORMAL LOW (ref 3.5–5.1)
Sodium: 138 mmol/L (ref 135–145)

## 2021-07-17 SURGERY — PPM GENERATOR CHANGEOUT

## 2021-07-17 MED ORDER — CEFAZOLIN SODIUM-DEXTROSE 2-4 GM/100ML-% IV SOLN
INTRAVENOUS | Status: AC
  Filled 2021-07-17: qty 100

## 2021-07-17 MED ORDER — SODIUM CHLORIDE 0.9 % IV SOLN
INTRAVENOUS | Status: AC
  Filled 2021-07-17: qty 2

## 2021-07-17 MED ORDER — CEFAZOLIN SODIUM-DEXTROSE 2-4 GM/100ML-% IV SOLN
2.0000 g | INTRAVENOUS | Status: AC
  Administered 2021-07-17: 2 g via INTRAVENOUS

## 2021-07-17 MED ORDER — LIDOCAINE HCL 1 % IJ SOLN
INTRAMUSCULAR | Status: AC
  Filled 2021-07-17: qty 60

## 2021-07-17 MED ORDER — SODIUM CHLORIDE 0.9 % IV SOLN
80.0000 mg | INTRAVENOUS | Status: AC
  Administered 2021-07-17: 80 mg

## 2021-07-17 MED ORDER — LIDOCAINE HCL (PF) 1 % IJ SOLN
INTRAMUSCULAR | Status: DC | PRN
  Administered 2021-07-17: 50 mL

## 2021-07-17 MED ORDER — SODIUM CHLORIDE 0.9 % IV SOLN: INTRAVENOUS | Status: DC

## 2021-07-17 MED ORDER — ACETAMINOPHEN 325 MG PO TABS: 325.0000 mg | ORAL_TABLET | ORAL | Status: DC | PRN

## 2021-07-17 MED ORDER — CHLORHEXIDINE GLUCONATE 4 % EX LIQD: 4.0000 "application " | Freq: Once | CUTANEOUS | Status: DC

## 2021-07-17 MED ORDER — ONDANSETRON HCL 4 MG/2ML IJ SOLN: 4.0000 mg | Freq: Four times a day (QID) | INTRAMUSCULAR | Status: DC | PRN

## 2021-07-17 SURGICAL SUPPLY — 5 items
CABLE SURGICAL S-101-97-12 (CABLE) ×2 IMPLANT
IPG PACE AZUR XT DR MRI W1DR01 (Pacemaker) IMPLANT
PACE AZURE XT DR MRI W1DR01 (Pacemaker) ×2 IMPLANT
PAD DEFIB RADIO PHYSIO CONN (PAD) ×2 IMPLANT
TRAY PACEMAKER INSERTION (PACKS) ×2 IMPLANT

## 2021-07-17 NOTE — H&P (Signed)
Cardiology Office Note Date:  07/17/2021  Patient ID:  [name] [name], [name] [name] 09-21-1940, MRN [id number] PCP:  Crist Infante, MD  Electrophysiologist:  Dr. Ky Barban    Chief Complaint:    planned f/u  History of Present Illness: [name] [name] is a 81 y.o. female with history of HTN, HLD, Parkinson's disease  (Dr. 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 Osker Mason), chronic CHF (diastolic) Today, denies symptoms of palpitations, chest pain, shortness of breath, orthopnea, PND, lower extremity edema, claudication, dizziness, presyncope, syncope, bleeding, or neurologic sequela. The patient is tolerating medications without difficulties. Plan for pacemaker generator change today.    Device information MDT dual chamber PPM implanted 10/20/2012  AFib Hx Diagnosed 2006 PVI ablation 11/11/2011, Dr. Rayann Heman AAD HX Flecainide> amiodarone >> rate control strategy   Past Medical History:  Diagnosis Date   Anemia    Arthritis    thumbs   Atrial tachycardia (HCC)    Atypical atrial flutter (HCC)    Cataract    bilateral   Chronic anticoagulation    on Xarelto   Chronic diastolic CHF (congestive heart failure) ([place])    Echocardiogram (11/24/12): Mild LVH, EF 60%.   Dysrhythmia    GERD (gastroesophageal reflux disease)    hx of no meds   Heart murmur    slight   Hyperlipidemia    a. Hx of leg weakness while on statin. under control   Hypertension    Hypothyroidism    Leg fracture Dec 21,2011   Osteoporosis 07/2007   Parkinson's disease ([place]) 2011   Persistent atrial fibrillation ([place])    s/p atrial fib ablation 11-11-11.    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[place])    Squamous carcinoma summer 2015   back of  left thigh   Wrist fracture    right    Past Surgical History:  Procedure Laterality Date   ATRIAL FIBRILLATION ABLATION N/A 11/11/2011   Procedure: ATRIAL FIBRILLATION ABLATION;  Surgeon: Thompson Grayer, MD;  Location[facility] CATH LAB;  Service: Cardiovascular;  Laterality: N/A;   BREAST BIOPSY  1998   BREAST SURGERY     benign cyst removed   CARDIAC ELECTROPHYSIOLOGY STUDY AND ABLATION  5/14, 7/14   convergent AF ablation at [facility] and subsequent atypical atrial flutter ablation by Dr Lehman Prom   CARDIOVASCULAR STRESS TEST  06-06-2008   EF 73%   CARDIOVERSION  09/09/2011   Procedure: CARDIOVERSION;  Surgeon: Darlin Coco, MD;  Location: [facility] ENDOSCOPY;  Service: Cardiovascular;  Laterality: N/A;  to be done by dr. Mare Ferrari   CARDIOVERSION  02/18/2012   Procedure: CARDIOVERSION;  Surgeon: Thayer Headings, MD;  Location: [facility] ENDOSCOPY;  Service: Cardiovascular;  Laterality: N/A;   [place]  2011   metal pin in place   FOREARM SURGERY Left 2010   "opened arm to drain it"   INSERTION OF MESH N/A 11/24/2013   Procedure: INSERTION OF MESH;  Surgeon: Michael Boston, MD;  Location: [facility] ORS;  Service: General;  Laterality: N/A;   KNEE SURGERY Left 2001   Left unicompartmental knee     Dr. Alvan Dame 11/18/16   PACEMAKER PLACEMENT  10/20/12   MDT Advisa DR implanted by Dr Lehman Prom at [facility]  PARTIAL KNEE ARTHROPLASTY Left 11/18/2016   Procedure: LEFT UNICOMPARTMENTAL KNEE Medially;  Surgeon: Paralee Cancel, MD;  Location: [facility] ORS;  Service: Orthopedics;  Laterality: Left;  90 mins   RIGHT HEART CATH N/A 06/13/2021   Procedure: RIGHT HEART CATH;  Surgeon: Larey Dresser, MD;  Location: [place] CV LAB;  Service: Cardiovascular;  Laterality: N/A;   SKIN CANCER DESTRUCTION  summer 2015   TEE WITHOUT CARDIOVERSION  11/10/2011   Procedure: TRANSESOPHAGEAL ECHOCARDIOGRAM (TEE);  Surgeon: Thayer Headings, MD;  Location: [facility] ENDOSCOPY;  Service: Cardiovascular;  Laterality: N/A;    TONSILLECTOMY  as child   US ECHOCARDIOGRAPHY  03-10-2008   Est EF 55-60%, Dr. Cathie Olden   VENTRAL HERNIA REPAIR N/A 11/24/2013   Procedure: LAPAROSCOPIC VENTRAL HERNIA;  Surgeon: Michael Boston, MD;  Location: [facility] ORS;  Service: General;  Laterality: N/A;   WRIST SURGERY Right ~2009   metal rod in place    Current Facility-Administered Medications  Medication Dose Route Frequency Provider Last Rate Last Admin   0.9 %  sodium chloride infusion   Intravenous Continuous Constance Haw, MD 50 mL/hr at 07/17/21 1232 New Bag at 07/17/21 1232   ceFAZolin (ANCEF) IVPB 2g/100 mL premix  2 g Intravenous On Call Emme Rosenau, Ocie Doyne, MD       chlorhexidine (HIBICLENS) 4 % liquid 4 application   4 application  Topical Once Cristina Mattern Hassell Done, MD       gentamicin (GARAMYCIN) 80 mg in sodium chloride 0.9 % 500 mL irrigation  80 mg Irrigation On Call Constance Haw, MD        Allergies:   Amiodarone and Statins   Social History:  The patient  reports that she has never smoked. She has never used smokeless tobacco. She reports that she does not drink alcohol and does not use drugs.   Family History:  The patient's family history includes Alzheimer's disease in her mother; Breast cancer in her maternal aunt; Healthy in her child and child; Heart failure in her father.  ROS:  Please see the history of present illness.   Otherwise, review of systems is positive for none.   All other systems are reviewed and negative.   PHYSICAL EXAM: VS:  BP (!) 119/35   Pulse 66   Temp 98.1 F (36.7 C) (Temporal)   Resp 18   Ht '5\' 1"'$  (1.549 m)   Wt 57.6 kg   LMP 02/04/1996   SpO2 99%   BMI 24.00 kg/m  , BMI Body mass index is 24 kg/m. GEN: Well nourished, well developed, in no acute distress  HEENT: normal  Neck: no JVD, carotid bruits, or masses Cardiac: RRR; no murmurs, rubs, or gallops,no edema  Respiratory:  clear to auscultation bilaterally, normal work of breathing GI: soft, nontender,  nondistended, + BS MS: no deformity or atrophy  Skin: warm and dry Neuro:  Strength and sensation are intact Psych: euthymic mood, full affect   EKG:  done today and reviewed by myself SR/VP, AV paced beat  PPM interrogation  RRT reached 04/29/21 Lead measurements are good + AMS, burden is <0.1% from last check AP 72% VP 24.4%  04/29/21: TTE  1. Left ventricular ejection fraction, by estimation, is 65 to 70%. The  left ventricle has normal function. The left ventricle has no regional  wall motion abnormalities. There is mild concentric left ventricular  hypertrophy. Left ventricular diastolic  parameters are indeterminate.   2. Right ventricular systolic function is normal. The right ventricular  size is normal. There is severely elevated pulmonary artery systolic  pressure. The estimated right ventricular systolic pressure is 72.5 mmHg.   3. Left atrial size was moderately dilated.   4. Right atrial size was moderately dilated.   5. The mitral valve is grossly normal. Mild mitral valve regurgitation.   6. Tricuspid valve regurgitation is moderate.   7. The aortic valve is tricuspid. There is mild calcification of the  aortic valve. There is mild thickening of the aortic valve. Aortic valve  regurgitation is not visualized. Aortic valve sclerosis/calcification is  present, without any evidence of  aortic stenosis.   Comparison(s): Compared to prior TTE in 11/2019, there is now moderate TR  and severe PHTN (previously mild TR and moderate PHTN).    11/16/2019: stress myoview  Nuclear stress EF: 71%. The left ventricular ejection fraction is hyperdynamic (>65%). The study is normal. This is a low risk study.   Fixed apical to mid anteroseptal perfusion defect, with normal systolic function, suggests artifact.   11/16/2019: TTE IMPRESSIONS   1. Left ventricular ejection fraction, by estimation, is 55 to 60%. The  left ventricle has normal function. The left ventricle  has no regional  wall motion abnormalities. Left ventricular diastolic function could not  be evaluated.   2. Right ventricular systolic function is normal. The right ventricular  size is normal. There is moderately elevated pulmonary artery systolic  pressure. The estimated right ventricular systolic pressure is 36.6 mmHg.   3. Left atrial size was moderately dilated.   4. Right atrial size was moderately dilated.   5. The mitral valve is normal in structure. Mild mitral valve  regurgitation. No evidence of mitral stenosis.   6. The aortic valve is normal in structure. Aortic valve regurgitation is  not visualized. No aortic stenosis is present.   Myoview 02/11/2018 Nuclear stress EF: 72%. The left ventricular ejection fraction is hyperdynamic (>65%). There was no ST segment deviation noted during stress. This is a low risk study. No evidence of ischemia. The study is normal.   Echocardiogram 02/11/2018 EF 60-65, normal wall motion, normal diastolic function, trivial AI, mild MR, normal RV SF, mild TR, PASP 40   cPET 04/25/2015 Conclusion: Exercise testing with gas exchange demonstrates a  moderate functional impairment when compared to matched sedentary  norms. At peak exercise, patient appears primarily circulatory  limited with hypotensive response, low-flat O2 pulse and mild  chronotropic incompetence. Pre-exercise spirometry suggests mild  restrictive lung physiology.   Echocardiogram 03/20/2015 EF 55-60, normal wall motion, trivial AI, mild TR, moderate BAE, moderate TR, PASP 49   Cardiac catheterization 05/25/2012 Coronary angiography: Coronary dominance: left Left mainstem: Short, no CAD.  Left anterior descending (LAD): No angiographic CAD.  Left circumflex (LCx): Large dominant vessel with no angiographic CAD.  Right coronary artery (RCA): Small, nondominant vessel with no angiographic CAD.   Left ventriculography: Left ventricular systolic function is normal, LVEF is  estimated at 55-60%, there is no significant mitral regurgitation   Final Conclusions:  Normal LV systolic function, no angiographic CAD.   11/11/2011: EPS/ablation PROCEDURES: 1. Comprehensive electrophysiologic study. 2. Coronary sinus pacing and recording. 3. Three-dimensional mapping of atrial fibrillation with additional mapping and ablation of discrete foci within the left atrium 4. Ablation of atrial fibrillation with additional ablation of discrete foci within the left atrium 5 Intracardiac echocardiography. 6. Transseptal puncture of an intact septum. 7. Rotational Angiography with processing at an independent workstation 8. Arrhythmia induction with pacing 9.External cardioversion.  Recent Labs: 09/28/2020: NT-Pro BNP 744 10/16/2020: Magnesium 2.0 04/22/2021: ALT 6 07/12/2021: B Natriuretic Peptide 109.8; BUN 14; Creatinine, Ser 0.69; Potassium 3.5; Sodium 136 07/17/2021: Hemoglobin 9.7; Platelets 181  No results found for requested labs within last 365 days.   Estimated Creatinine Clearance: 46.5 mL/min (by C-G formula based on SCr of 0.69 mg/dL).   Wt Readings from Last 3 Encounters:  07/17/21 57.6 kg  07/12/21 60.6 kg  06/13/21 58.1 kg     Other studies reviewed: Additional studies/records reviewed today include: summarized above  ASSESSMENT AND PLAN:  1. PPM [name] [name] has presented today for surgery, with the diagnosis of pacemaker ERI.  The various methods of treatment have been discussed with the patient and family. After consideration of risks, benefits and other options for treatment, the patient has consented to  Procedure(s): Pacemaker generator change as a surgical intervention .  Risks include but not limited to bleeding, infection, pneumothorax, perforation, tamponade, vascular damage, renal failure, MI, stroke, death, and lead dislodgement . The patient's history has been reviewed, patient examined, no change in status, stable for surgery.  I  have reviewed the patient's chart and labs.  Questions were answered to the patient's satisfaction.    [name] Grandt Curt Bears, MD 07/17/2021 12:51 PM

## 2021-07-17 NOTE — Progress Notes (Signed)
Spoke with Dr Curt Bears r/t to CXR order for 6/15, pressure dsg in place and restart eloquis

## 2021-07-18 ENCOUNTER — Encounter (HOSPITAL_COMMUNITY): Payer: Self-pay | Admitting: Cardiology

## 2021-07-18 ENCOUNTER — Encounter (INDEPENDENT_AMBULATORY_CARE_PROVIDER_SITE_OTHER): Payer: Medicare Other | Admitting: Ophthalmology

## 2021-07-18 MED FILL — Lidocaine HCl Local Inj 1%: INTRAMUSCULAR | Qty: 60 | Status: AC

## 2021-07-22 ENCOUNTER — Ambulatory Visit
Admission: RE | Admit: 2021-07-22 | Discharge: 2021-07-22 | Disposition: A | Discharge status: Home / Self Care | Payer: Medicare Other | Source: Ambulatory Visit | Attending: Internal Medicine | Admitting: Internal Medicine

## 2021-07-22 ENCOUNTER — Ambulatory Visit (HOSPITAL_COMMUNITY)
Admission: RE | Admit: 2021-07-22 | Discharge: 2021-07-22 | Disposition: A | Discharge status: Home / Self Care | Payer: Medicare Other | Source: Ambulatory Visit | Attending: Internal Medicine | Admitting: Internal Medicine

## 2021-07-22 DIAGNOSIS — I5032 Chronic diastolic (congestive) heart failure: Secondary | ICD-10-CM | POA: Diagnosis not present

## 2021-07-22 DIAGNOSIS — Z1231 Encounter for screening mammogram for malignant neoplasm of breast: Secondary | ICD-10-CM | POA: Diagnosis not present

## 2021-07-22 LAB — BASIC METABOLIC PANEL
Anion gap: 10 (ref 5–15)
BUN: 13 mg/dL (ref 8–23)
CO2: 30 mmol/L (ref 22–32)
Calcium: 9.2 mg/dL (ref 8.9–10.3)
Chloride: 97 mmol/L — ABNORMAL LOW (ref 98–111)
Creatinine, Ser: 0.8 mg/dL (ref 0.44–1.00)
GFR, Estimated: >60 mL/min (ref >60)
Glucose, Bld: 126 mg/dL — ABNORMAL HIGH (ref 70–99)
Potassium: 4.1 mmol/L (ref 3.5–5.1)
Sodium: 137 mmol/L (ref 135–145)

## 2021-07-23 ENCOUNTER — Ambulatory Visit (HOSPITAL_COMMUNITY)
Admission: RE | Admit: 2021-07-23 | Discharge: 2021-07-23 | Disposition: A | Discharge status: Home / Self Care | Payer: Medicare Other | Source: Ambulatory Visit | Attending: Internal Medicine | Admitting: Internal Medicine

## 2021-07-23 DIAGNOSIS — D649 Anemia, unspecified: Secondary | ICD-10-CM | POA: Diagnosis not present

## 2021-07-23 MED ORDER — SODIUM CHLORIDE 0.9 % IV SOLN
510.0000 mg | INTRAVENOUS | Status: DC
  Administered 2021-07-23: 510 mg via INTRAVENOUS
  Filled 2021-07-23: qty 510

## 2021-07-28 NOTE — Progress Notes (Signed)
Cardiology Office Note Date:  07/31/2021  Patient ID:  [name] [name], [name] [name] [birth year], MRN [id number] PCP:  Crist Infante, MD  Electrophysiologist:  Dr. Ky Barban >> Dr. Curt Bears    Chief Complaint:     wound check  History of Present Illness: [name] [name] is a 81 y.o. female with history of HTN, HLD, Parkinson's disease  (Dr. 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 Osker Mason), chronic CHF (diastolic)  I saw her 4/50/38 She is doing well.  She is not a steady on her feet with the Parkinon's, though no stumbles, falls.  She denies any CP, occassionally has an awareness of her heart beat, but not fast, irregular.  No rest SOB, she sleeps weill with no symptoms or PND or orthopnea.  She has some baseline DOE that is unchanged. She was having PAFib by her EKGs/remotes pacer programmed MVP  She saw Dr. Rayann Heman in f/u Sept 2021 feeling well though mentioned some SOB, AF burden only 4%, was planned for stress and echo and if low risk could be cleared for EGD she was pending  Oct 2021: TTE with LVEF 55-60%, RVSP 52.2mHg, stress as low risk, no ischemia/infarct  More recently she saw A. Tillery PA-C Sept 2022, SOB was improved though not completely resolved with adjustment in her lasix, no changes were made, she was cleared for colonoscopy  Pre-op pool and [facility] have commented and cleared   I saw her 12/31/20 She is doing "OK I guess", she is getting the colonoscopy 2/2 heme + stools, denies obvious bleeding or bleeding otherwise. She confirms that she has been said to be anemic for quite a long time, that is not new for her. She denies any CP, palpitations or cardiac awareness. She does say though that while generally for years she does not have much energy in the last month of so she will have an hour here and there that she feels somewhat suddenly really tired.  No t weak, not  lightheaded, just tired and then for no clear reason feels better/back to her baseline. No clear trigger, rhyme/reason to it. No fever, illness No changes to meds PMD does labs  AF burden was up, though she also reported increased personal stress, was being evaluated for anemia, pending GI/scope Home transmitter not working, she was nearing ERI. Planned for her to f/u with device clinic once home to trouble shoot her transmitter and have early follow up to monitor AF burden and ensure home monitor function with nearing ERI  + remotes, AF burden last <0.1% SOB, fatigue, ? anemia  I saw her 02/11/21 She is doing well NO bleeding or signs of bleeding GI w/u was good, no bleeding found, pending further d/w her PMD. She feels dehydrated, mouth is dry and would like to reduce her lasix dose. She feels like generally she is feeling OK No CP or palpitations Denies any ongoing SOB, and probably a bit better with energy, but doesn't tend to sleep well. No near syncope or syncope Eyes are itchy and red a couple days, no pain, no drainage, thought was allergy but plans to see her PMD or eye doctor Planned to reduce her lasix/K+, follow for volume OL at home, f/u labs planned Monthly battery checks nearing ERI  02/14/21: ER visit, dizziness, nonspecific , not feeling right,  had taken extra laxative for constipation and a new supplement CT was negative, labs only notable for hypokalemia (  3.4) suspect 2/2 laxative use and increased BM, replaced orally and given IVF EKG reported as an accelerated junctional rhythm, 73bpm  I saw her 04/22/21 She is accompanied by her husband today He mentions that intermittently she sleep very poorly and when that happens she had very little daytime energy. Given as example that when she is sleeping well they walk on trails, she can do inclines, and feels well.   She has not slept now in 2 days straight and her energy is very poor, doesn't want to do anything, she also  during these times c/o stomach problems. She denies N/V or pain, just something in her belly, locate low abdomen. This is apparently not new, waxes and wanes, and by his observation always associated with times of her insomnia She denies symptoms, just can not fall asleep, mind races. She had a fall a few weeks ago and broke her R 5th finger, apparently a crush injury saw orthopedics, also felt to have gotten infected and comleted antibiotocs, though remains swollen and red. The fall was a loss of balance, turned too quickly and fell.  No syncope No palpitations No SOB, again in fact she reports feeling dehydrated, very dry mouth. Unclear CP, she thinks is indigestion with a known hernia, is random, rare, not associated wth position or exertion, not new. No bleeding or signs of bleeding  Advised to f/u with PMD regarding her insomnia/fatigue, her PMD or ortho for her finger which was still painful and erythematous Generator was near ERI She was very worried that her fatigue was something serious and planned for labs, echo  Labs were stable, anemia unchanged from last labs. Echo noted preserved LVEF , very high pulm pressures and planned for AHF clinic referral.  I saw her 05/23/21 She is accompanied by her husband Sleep waxes and wanes, of late has been able to get better sleep and when she does, feels much better Her finger is better.  Still swollen, not red, not painful Last [name] she had an episode of CP that she suspected was heart burn/esophagus, center chest from the base of her throat to her epigastrum, no associated symptoms, did not change with position or exertion, eased off and was gone in about 8 minutes or so.  She went to dinner and felt well, has not had it again. No dizziness, near syncope or syncope No bleeding or signs of bleeding Planned for gen change   She saw Dr. Aundra Dubin last 07/12/21, she was a bit more SOB and in Afib that day Her lasix was increased She was in rate  controlled afib with a low burden of 2.6%  Gen change done 07/17/21  TODAY She is doing fairly well No ongoing SOB, though waxes/wanes. Her husband mentions again, as all along, if she gets enough sleep everything is better. NO bleeding or signs of bleeding No wound concerns No CP, palpitations or real cardiac awareness No near syncope or syncope. No wound concerns   Device information MDT dual chamber PPM implanted 10/20/2012, gen change 07/17/21  AFib Hx Diagnosed 2006 PVI ablation 11/11/2011, Dr. Rayann Heman AAD HX Flecainide> amiodarone >> rate control strategy   Past Medical History:  Diagnosis Date   Anemia    Arthritis    thumbs   Atrial tachycardia (HCC)    Atypical atrial flutter (HCC)    Cataract    bilateral   Chronic anticoagulation    on Xarelto   Chronic diastolic CHF (congestive heart failure) (HCC)    Echocardiogram (  11/24/12): Mild LVH, EF 60%.   Dysrhythmia    GERD (gastroesophageal reflux disease)    hx of no meds   Heart murmur    slight   Hyperlipidemia    a. Hx of leg weakness while on statin. under control   Hypertension    Hypothyroidism    Leg fracture Dec 21,2011   Osteoporosis 07/2007   Parkinson's disease ([place]) 2011   Persistent atrial fibrillation ([place])    s/p atrial fib ablation 11-11-11.    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[place])    Squamous carcinoma summer 2015   back of left thigh   Wrist fracture    right    Past Surgical History:  Procedure Laterality Date   ATRIAL FIBRILLATION ABLATION N/A 11/11/2011   Procedure: ATRIAL FIBRILLATION ABLATION;  Surgeon: Thompson Grayer, MD;  Location[facility] CATH LAB;  Service: Cardiovascular;  Laterality: N/A;   BREAST BIOPSY  1998   BREAST SURGERY     benign cyst removed   CARDIAC ELECTROPHYSIOLOGY STUDY AND ABLATION  5/14, 7/14    convergent AF ablation at [facility] and subsequent atypical atrial flutter ablation by Dr Lehman Prom   CARDIOVASCULAR STRESS TEST  06-06-2008   EF 73%   CARDIOVERSION  09/09/2011   Procedure: CARDIOVERSION;  Surgeon: Darlin Coco, MD;  Location: [place];  Service: Cardiovascular;  Laterality: N/A;  to be done by dr. Mare Ferrari   CARDIOVERSION  02/18/2012   Procedure: CARDIOVERSION;  Surgeon: Thayer Headings, MD;  Location: [facility] ENDOSCOPY;  Service: Cardiovascular;  Laterality: N/A;   [place]  2011   metal pin in place   FOREARM SURGERY Left 2010   "opened arm to drain it"   INSERTION OF MESH N/A 11/24/2013   Procedure: INSERTION OF MESH;  Surgeon: Michael Boston, MD;  Location: [facility] ORS;  Service: General;  Laterality: N/A;   KNEE SURGERY Left 2001   Left unicompartmental knee     Dr. Alvan Dame 11/18/16   PACEMAKER PLACEMENT  10/20/12   MDT Advisa DR implanted by Dr Lehman Prom at [facility] Left 11/18/2016   Procedure: LEFT UNICOMPARTMENTAL KNEE Medially;  Surgeon: Paralee Cancel, MD;  Location: [facility] ORS;  Service: Orthopedics;  Laterality: Left;  90 mins   PPM GENERATOR CHANGEOUT N/A 07/17/2021   Procedure: PPM GENERATOR CHANGEOUT;  Surgeon: Constance Haw, MD;  Location: [place] CV LAB;  Service: Cardiovascular;  Laterality: N/A;   RIGHT HEART CATH N/A 06/13/2021   Procedure: RIGHT HEART CATH;  Surgeon: Larey Dresser, MD;  Location: [place] CV LAB;  Service: Cardiovascular;  Laterality: N/A;   SKIN CANCER DESTRUCTION  summer 2015   TEE WITHOUT CARDIOVERSION  11/10/2011   Procedure: TRANSESOPHAGEAL ECHOCARDIOGRAM (TEE);  Surgeon: Thayer Headings, MD;  Location: [place];  Service: Cardiovascular;  Laterality: N/A;   TONSILLECTOMY  as child   US ECHOCARDIOGRAPHY  03-10-2008   Est EF 55-60%, Dr. Cathie Olden   VENTRAL HERNIA REPAIR N/A 11/24/2013   Procedure: LAPAROSCOPIC VENTRAL HERNIA;  Surgeon: Michael Boston, MD;  Location: [facility] ORS;   Service: General;  Laterality: N/A;   WRIST SURGERY Right ~2009   metal rod in place    Current Outpatient Medications  Medication Sig Dispense Refill   Acetylcysteine (NAC 600) 600 MG  CAPS Take 600 mg by mouth daily.     BELSOMRA 20 MG TABS Take 1 tablet by mouth at bedtime as needed (sleep).     CALCIUM PO Take 1 tablet by mouth at bedtime.     carbidopa-levodopa (SINEMET CR) 50-200 MG tablet TAKE 1 TABLET BY MOUTH EVERYDAY AT BEDTIME 90 tablet 1   carbidopa-levodopa (SINEMET IR) 25-100 MG tablet Take 2 tablets by mouth 3 (three) times daily. 540 tablet 1   cholecalciferol (VITAMIN D3) 25 MCG (1000 UNIT) tablet Take 1,000 Units by mouth daily.     cyanocobalamin (,VITAMIN B-12,) 1000 MCG/ML injection INJECT 1ML INTRAMUSCULARLY IN THE DELTOID ONCE A MONTH (ALTERNATING DELT. EACH MONTH)     dapagliflozin propanediol (FARXIGA) 10 MG TABS tablet Take 1 tablet (10 mg total) by mouth daily before breakfast. 30 tablet 11   docusate sodium (COLACE) 100 MG capsule Take 100 mg by mouth in the morning and at bedtime.     ELIQUIS 5 MG TABS tablet TAKE 1 TABLET BY MOUTH TWICE A DAY 60 tablet 5   ezetimibe (ZETIA) 10 MG tablet Take 10 mg by mouth every other day. Taking the same day as the crestor     furosemide (LASIX) 80 MG tablet Take 80 mg by mouth 2 (two) times daily.     levothyroxine (SYNTHROID) 50 MCG tablet Take 50-75 mcg by mouth See admin instructions. Pt take 1 1/2 tablet (75 mcg) on Monday and Thursday and 1 tablet (50 mcg)  the rest of the week     magnesium oxide (MAG-OX) 400 MG tablet Take 400 mg by mouth at bedtime.     metoprolol succinate (TOPROL-XL) 25 MG 24 hr tablet TAKE 1 TABLET BY MOUTH DAILY. 90 tablet 1   multivitamin-lutein (OCUVITE-LUTEIN) CAPS capsule Take 1 capsule by mouth daily.     NON FORMULARY Take 2 tablets by mouth daily. Liposomal Glutathione take     OVER THE COUNTER MEDICATION Take 1 capsule by mouth daily. MetaCalm     polycarbophil (FIBERCON) 625 MG tablet Take  1,250 mg by mouth daily.     potassium chloride (KLOR-CON M) 10 MEQ tablet Take 2 tablets (20 mEq total) by mouth every morning AND 1 tablet (10 mEq total) every evening. 90 tablet 6   Probiotic Product (PROBIOTIC-10 PO) Take 1 tablet by mouth daily. Lafonda Mosses Brand     Propylene Glycol (SYSTANE BALANCE) 0.6 % SOLN Place 1 drop into both eyes daily as needed (dry eyes).     raloxifene (EVISTA) 60 MG tablet Take 60 mg by mouth daily.     rasagiline (AZILECT) 1 MG TABS tablet Take 1 tablet (1 mg total) by mouth daily. 90 tablet 1   rosuvastatin (CRESTOR) 5 MG tablet Take 2.5 mg by mouth every other day.     valACYclovir (VALTREX) 1000 MG tablet Take 1,000 mg by mouth 2 (two) times daily as needed (fever blisters).     vitamin B-12 (CYANOCOBALAMIN) 100 MCG tablet Take 100 mcg by mouth daily.     vitamin C (ASCORBIC ACID) 500 MG tablet Take 500 mg by mouth daily.     No current facility-administered medications for this visit.    Allergies:   Amiodarone and Statins   Social History:  The patient  reports that she has never smoked. She has never used smokeless tobacco. She reports that she does not drink alcohol and does not use drugs.   Family History:  The patient's family history includes Alzheimer's disease in her mother;  Breast cancer in her maternal aunt; Healthy in her child and child; Heart failure in her father.  ROS:  Please see the history of present illness.    All other systems are reviewed and otherwise negative.   PHYSICAL EXAM:  VS:  BP 110/60   Pulse 67   Ht 5' 2.5" (1.588 m)   Wt 126 lb 3.2 oz (57.2 kg)   LMP 02/04/1996   SpO2 95%   BMI 22.71 kg/m  BMI: Body mass index is 22.71 kg/m. Well nourished, well developed, in no acute distress  HEENT: normocephalic, atraumatic  Neck: no JVD, carotid bruits or masses Cardiac:  RRR; no significant murmurs, no rubs, or gallops Lungs:  CTA b/l, no wheezing, rhonchi or rales  Abd: soft, nontender MS: no deformity, age  appropriate/perhaps advanced atrophy Ext: trace edema  Skin: warm and dry, no rash Neuro:  Parkinson-like movements, other-wise no gross motor deficits appreciated Psych: euthymic mood, full affect  PPM site : steri strips are removed without difficulty. There is a small are mid-incision about 21m long of skin overlap that is not entirely healed, there is no bleeding or open wound. No erythema, edema, fluctuation or heat    EKG:  not done today  PPM interrogation  Battery and lead measurements are good AMS 0.4% AP 78.4% VP 96%  06/13/21: RHC 1. Mildly elevated PCWP with prominent V-waves to 30 mmHg 2. Mild-moderate pulmonary venous hypertension.    I reviewed echo, there is only mild MR.  Suspect prominent V-waves are due to LV diastolic dysfunction.  I will start SGLT2 inhibitor. No indication for selective pulmonary vasodilator.    04/29/21: TTE  1. Left ventricular ejection fraction, by estimation, is 65 to 70%. The  left ventricle has normal function. The left ventricle has no regional  wall motion abnormalities. There is mild concentric left ventricular  hypertrophy. Left ventricular diastolic  parameters are indeterminate.   2. Right ventricular systolic function is normal. The right ventricular  size is normal. There is severely elevated pulmonary artery systolic  pressure. The estimated right ventricular systolic pressure is 626.8mmHg.   3. Left atrial size was moderately dilated.   4. Right atrial size was moderately dilated.   5. The mitral valve is grossly normal. Mild mitral valve regurgitation.   6. Tricuspid valve regurgitation is moderate.   7. The aortic valve is tricuspid. There is mild calcification of the  aortic valve. There is mild thickening of the aortic valve. Aortic valve  regurgitation is not visualized. Aortic valve sclerosis/calcification is  present, without any evidence of  aortic stenosis.   Comparison(s): Compared to prior TTE in 11/2019,  there is now moderate TR  and severe PHTN (previously mild TR and moderate PHTN).    11/16/2019: stress myoview  Nuclear stress EF: 71%. The left ventricular ejection fraction is hyperdynamic (>65%). The study is normal. This is a low risk study.   Fixed apical to mid anteroseptal perfusion defect, with normal systolic function, suggests artifact.   11/16/2019: TTE IMPRESSIONS   1. Left ventricular ejection fraction, by estimation, is 55 to 60%. The  left ventricle has normal function. The left ventricle has no regional  wall motion abnormalities. Left ventricular diastolic function could not  be evaluated.   2. Right ventricular systolic function is normal. The right ventricular  size is normal. There is moderately elevated pulmonary artery systolic  pressure. The estimated right ventricular systolic pressure is 534.1mmHg.   3. Left atrial size was  moderately dilated.   4. Right atrial size was moderately dilated.   5. The mitral valve is normal in structure. Mild mitral valve  regurgitation. No evidence of mitral stenosis.   6. The aortic valve is normal in structure. Aortic valve regurgitation is  not visualized. No aortic stenosis is present.   Myoview 02/11/2018 Nuclear stress EF: 72%. The left ventricular ejection fraction is hyperdynamic (>65%). There was no ST segment deviation noted during stress. This is a low risk study. No evidence of ischemia. The study is normal.   Echocardiogram 02/11/2018 EF 60-65, normal wall motion, normal diastolic function, trivial AI, mild MR, normal RV SF, mild TR, PASP 40   cPET 04/25/2015 Conclusion: Exercise testing with gas exchange demonstrates a  moderate functional impairment when compared to matched sedentary  norms. At peak exercise, patient appears primarily circulatory  limited with hypotensive response, low-flat O2 pulse and mild  chronotropic incompetence. Pre-exercise spirometry suggests mild  restrictive lung physiology.    Echocardiogram 03/20/2015 EF 55-60, normal wall motion, trivial AI, mild TR, moderate BAE, moderate TR, PASP 49   Cardiac catheterization 05/25/2012 Coronary angiography: Coronary dominance: left Left mainstem: Short, no CAD.  Left anterior descending (LAD): No angiographic CAD.  Left circumflex (LCx): Large dominant vessel with no angiographic CAD.  Right coronary artery (RCA): Small, nondominant vessel with no angiographic CAD.   Left ventriculography: Left ventricular systolic function is normal, LVEF is estimated at 55-60%, there is no significant mitral regurgitation   Final Conclusions:  Normal LV systolic function, no angiographic CAD.   11/11/2011: EPS/ablation PROCEDURES: 1. Comprehensive electrophysiologic study. 2. Coronary sinus pacing and recording. 3. Three-dimensional mapping of atrial fibrillation with additional mapping and ablation of discrete foci within the left atrium 4. Ablation of atrial fibrillation with additional ablation of discrete foci within the left atrium 5 Intracardiac echocardiography. 6. Transseptal puncture of an intact septum. 7. Rotational Angiography with processing at an independent workstation 8. Arrhythmia induction with pacing 9.External cardioversion.    Recent Labs: 09/28/2020: NT-Pro BNP 744 10/16/2020: Magnesium 2.0 04/22/2021: ALT 6 07/12/2021: B Natriuretic Peptide 109.8 07/17/2021: Hemoglobin 9.7; Platelets 181 07/22/2021: BUN 13; Creatinine, Ser 0.80; Potassium 4.1; Sodium 137  No results found for requested labs within last 365 days.   Estimated Creatinine Clearance: 46.2 mL/min (by C-G formula based on SCr of 0.8 mg/dL).   Wt Readings from Last 3 Encounters:  07/31/21 126 lb 3.2 oz (57.2 kg)  07/23/21 127 lb (57.6 kg)  07/17/21 127 lb (57.6 kg)     Other studies reviewed: Additional studies/records reviewed today include: summarized above  ASSESSMENT AND PLAN:  1. PPM A steri strip was placed at the area of overlap area  discussed above There is no open wound No signs of infection Looks good Advised to keep dry for 3 more days and leave the steri strip until if falls off   2. Longstanding persistent AFib     CHA2DS2Vasc is 5, on Eliquis, appropriately dosed     0.4%  % burden    3. Chronic CHF (diastolic) 4. P. HTN     No symptoms or exam findings to suggest volume OL     C/w Dr. Aundra Dubin      4. HTN     Looks good  5. Insomnia, fatigue waxing/waning fatigue/reduced exertional capacity, seems to align with her periods of poor sleep Better of late  6. CP Not an ongoing complaints    Disposition: remotes as usual, in clinic as scheduled with Dr.  Camnitz   Current medicines are reviewed at length with the patient today.  The patient did not have any concerns regarding medicines.  [name] Night, PA-C 07/31/2021 6:09 PM     [address] [place] [address] [phone] (office)  [phone] (fax)

## 2021-07-31 ENCOUNTER — Ambulatory Visit (INDEPENDENT_AMBULATORY_CARE_PROVIDER_SITE_OTHER): Payer: Medicare Other | Admitting: Physician Assistant

## 2021-07-31 ENCOUNTER — Encounter: Payer: Self-pay | Admitting: Physician Assistant

## 2021-07-31 ENCOUNTER — Ambulatory Visit: Payer: Medicare Other

## 2021-07-31 VITALS — BP 110/60 | HR 67 | Ht 62.5 in | Wt 126.2 lb

## 2021-07-31 DIAGNOSIS — Z5189 Encounter for other specified aftercare: Secondary | ICD-10-CM

## 2021-07-31 DIAGNOSIS — I10 Essential (primary) hypertension: Secondary | ICD-10-CM

## 2021-07-31 DIAGNOSIS — I5032 Chronic diastolic (congestive) heart failure: Secondary | ICD-10-CM | POA: Diagnosis not present

## 2021-07-31 DIAGNOSIS — Z950 Presence of cardiac pacemaker: Secondary | ICD-10-CM | POA: Diagnosis not present

## 2021-07-31 DIAGNOSIS — I4811 Longstanding persistent atrial fibrillation: Secondary | ICD-10-CM | POA: Diagnosis not present

## 2021-07-31 LAB — CUP PACEART INCLINIC DEVICE CHECK
Date Time Interrogation Session: 20230628192909
Implantable Lead Implant Date: 20140917
Implantable Lead Implant Date: 20140917
Implantable Lead Location: 753859
Implantable Lead Location: 753860
Implantable Pulse Generator Implant Date: 20230614

## 2021-07-31 NOTE — Patient Instructions (Signed)
Medication Instructions:  Your physician recommends that you continue on your current medications as directed. Please refer to the Current Medication list given to you today.  *If you need a refill on your cardiac medications before your next appointment, please call your pharmacy*   Lab Work: None ordered  If you have labs (blood work) drawn today and your tests are completely normal, you will receive your results only by: [place] (if you have MyChart) OR A paper copy in the mail If you have any lab test that is abnormal or we need to change your treatment, we will call you to review the results.   Testing/Procedures: None ordered   Follow-Up: At [facility], you and your health needs are our priority.  As part of our continuing mission to provide you with exceptional heart care, we have created designated Provider Care Teams.  These Care Teams include your primary Cardiologist (physician) and Advanced Practice Providers (APPs -  Physician Assistants and Nurse Practitioners) who all work together to provide you with the care you need, when you need it.  We recommend signing up for the patient portal called "MyChart".  Sign up information is provided on this After Visit Summary.  MyChart is used to connect with patients for Virtual Visits (Telemedicine).  Patients are able to view lab/test results, encounter notes, upcoming appointments, etc.  Non-urgent messages can be sent to your provider as well.   To learn more about what you can do with MyChart, go to [email or web address].    Your next appointment:   3 month(s)  Post procedure appt   The format for your next appointment:   In Person  Provider:   Allegra Lai, MD or Tommye Standard, PA-C    Other Instructions   Important Information About Sugar

## 2021-08-06 ENCOUNTER — Other Ambulatory Visit: Payer: Self-pay | Admitting: Physician Assistant

## 2021-08-06 DIAGNOSIS — I4811 Longstanding persistent atrial fibrillation: Secondary | ICD-10-CM

## 2021-08-07 NOTE — Telephone Encounter (Signed)
Eliquis '5mg'$  refill request received. Patient is 81 years old, weight-57.2kg, Crea-0.80 on 07/22/2021, Diagnosis-Afib, and last seen by Tommye Standard on 07/31/2021. Dose is appropriate based on dosing criteria. Will send in refill to requested pharmacy.

## 2021-08-08 ENCOUNTER — Other Ambulatory Visit: Payer: Self-pay | Admitting: Physician Assistant

## 2021-08-12 ENCOUNTER — Other Ambulatory Visit: Payer: Self-pay | Admitting: Neurology

## 2021-08-20 ENCOUNTER — Ambulatory Visit (INDEPENDENT_AMBULATORY_CARE_PROVIDER_SITE_OTHER): Payer: Medicare Other | Admitting: Ophthalmology

## 2021-08-20 ENCOUNTER — Encounter (INDEPENDENT_AMBULATORY_CARE_PROVIDER_SITE_OTHER): Payer: Self-pay | Admitting: Ophthalmology

## 2021-08-20 DIAGNOSIS — H353211 Exudative age-related macular degeneration, right eye, with active choroidal neovascularization: Secondary | ICD-10-CM

## 2021-08-20 DIAGNOSIS — H35721 Serous detachment of retinal pigment epithelium, right eye: Secondary | ICD-10-CM | POA: Diagnosis not present

## 2021-08-20 DIAGNOSIS — H353132 Nonexudative age-related macular degeneration, bilateral, intermediate dry stage: Secondary | ICD-10-CM

## 2021-08-20 MED ORDER — AFLIBERCEPT 2MG/0.05ML IZ SOLN FOR KALEIDOSCOPE
2.0000 mg | INTRAVITREAL | Status: AC | PRN
  Administered 2021-08-20: 2 mg via INTRAVITREAL

## 2021-08-20 NOTE — Assessment & Plan Note (Signed)
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

## 2021-08-20 NOTE — Assessment & Plan Note (Signed)
No sign of CNVM OS 

## 2021-08-20 NOTE — Assessment & Plan Note (Signed)

## 2021-08-20 NOTE — Progress Notes (Signed)
08/20/2021     CHIEF COMPLAINT Patient presents for No chief complaint on file.     HISTORY OF PRESENT ILLNESS: [name] [name] is a 81 y.o. female who presents to the clinic today for:   HPI   5 weeks dilate OD eylea OCT  Pt states her vision has been stable  Pt denies any new floaters or FOL  Last edited by Morene Rankins, CMA on 08/20/2021  1:48 PM.      Referring physician: Crist Infante, MD [place],  [place] [id number]  HISTORICAL INFORMATION:   Selected notes from the [facility]    Lab Results  Component Value Date   HGBA1C  05/21/2007    5.6 (NOTE)   The ADA recommends the following therapeutic goals for glycemic   control related to Hgb A1C measurement:   Goal of Therapy:   < 7.0% Hgb A1C   Action Suggested:  > 8.0% Hgb A1C   Ref:  Diabetes Care, 22, Suppl. 1, 1999     CURRENT MEDICATIONS: Current Outpatient Medications (Ophthalmic Drugs)  Medication Sig   Propylene Glycol (SYSTANE BALANCE) 0.6 % SOLN Place 1 drop into both eyes daily as needed (dry eyes).   No current facility-administered medications for this visit. (Ophthalmic Drugs)   Current Outpatient Medications (Other)  Medication Sig   Acetylcysteine (NAC 600) 600 MG CAPS Take 600 mg by mouth daily.   apixaban (ELIQUIS) 5 MG TABS tablet TAKE 1 TABLET BY MOUTH TWICE A DAY   BELSOMRA 20 MG TABS Take 1 tablet by mouth at bedtime as needed (sleep).   CALCIUM PO Take 1 tablet by mouth at bedtime.   carbidopa-levodopa (SINEMET CR) 50-200 MG tablet TAKE 1 TABLET BY MOUTH EVERYDAY AT BEDTIME   carbidopa-levodopa (SINEMET IR) 25-100 MG tablet Take 2 tablets by mouth 3 (three) times daily.   cholecalciferol (VITAMIN D3) 25 MCG (1000 UNIT) tablet Take 1,000 Units by mouth daily.   cyanocobalamin (,VITAMIN B-12,) 1000 MCG/ML injection INJECT 1ML INTRAMUSCULARLY IN THE DELTOID ONCE A MONTH (ALTERNATING DELT. EACH MONTH)   dapagliflozin propanediol (FARXIGA) 10 MG TABS tablet  Take 1 tablet (10 mg total) by mouth daily before breakfast.   docusate sodium (COLACE) 100 MG capsule Take 100 mg by mouth in the morning and at bedtime.   ezetimibe (ZETIA) 10 MG tablet Take 10 mg by mouth every other day. Taking the same day as the crestor   furosemide (LASIX) 80 MG tablet Take 80 mg by mouth 2 (two) times daily.   levothyroxine (SYNTHROID) 50 MCG tablet Take 50-75 mcg by mouth See admin instructions. Pt take 1 1/2 tablet (75 mcg) on Monday and Thursday and 1 tablet (50 mcg)  the rest of the week   magnesium oxide (MAG-OX) 400 MG tablet Take 400 mg by mouth at bedtime.   metoprolol succinate (TOPROL-XL) 25 MG 24 hr tablet TAKE 1 TABLET BY MOUTH EVERY DAY   multivitamin-lutein (OCUVITE-LUTEIN) CAPS capsule Take 1 capsule by mouth daily.   NON FORMULARY Take 2 tablets by mouth daily. Liposomal Glutathione take   OVER THE COUNTER MEDICATION Take 1 capsule by mouth daily. MetaCalm   polycarbophil (FIBERCON) 625 MG tablet Take 1,250 mg by mouth daily.   potassium chloride (KLOR-CON M) 10 MEQ tablet Take 2 tablets (20 mEq total) by mouth every morning AND 1 tablet (10 mEq total) every evening.   Probiotic Product (PROBIOTIC-10 PO) Take 1 tablet by mouth daily. Lafonda Mosses Brand   raloxifene (  EVISTA) 60 MG tablet Take 60 mg by mouth daily.   rasagiline (AZILECT) 1 MG TABS tablet Take 1 tablet (1 mg total) by mouth daily.   rosuvastatin (CRESTOR) 5 MG tablet Take 2.5 mg by mouth every other day.   valACYclovir (VALTREX) 1000 MG tablet Take 1,000 mg by mouth 2 (two) times daily as needed (fever blisters).   vitamin B-12 (CYANOCOBALAMIN) 100 MCG tablet Take 100 mcg by mouth daily.   vitamin C (ASCORBIC ACID) 500 MG tablet Take 500 mg by mouth daily.   No current facility-administered medications for this visit. (Other)      REVIEW OF SYSTEMS: ROS   Negative for: Constitutional, Gastrointestinal, Neurological, Skin, Genitourinary, Musculoskeletal, HENT, Endocrine, Cardiovascular, Eyes,  Respiratory, Psychiatric, Allergic/Imm, Heme/Lymph Last edited by Morene Rankins, CMA on 08/20/2021  1:48 PM.       ALLERGIES Allergies  Allergen Reactions   Amiodarone Other (See Comments)    Made pt feel terrible    Statins     Leg weakness - but tolerating low dose Crestor every other day    PAST MEDICAL HISTORY Past Medical History:  Diagnosis Date   Anemia    Arthritis    thumbs   Atrial tachycardia (HCC)    Atypical atrial flutter (HCC)    Cataract    bilateral   Chronic anticoagulation    on Xarelto   Chronic diastolic CHF (congestive heart failure) (HCC)    Echocardiogram (11/24/12): Mild LVH, EF 60%.   Dysrhythmia    GERD (gastroesophageal reflux disease)    hx of no meds   Heart murmur    slight   Hyperlipidemia    a. Hx of leg weakness while on statin. under control   Hypertension    Hypothyroidism    Leg fracture Dec 21,2011   Osteoporosis 07/2007   Parkinson's disease ([place]) 2011   Persistent atrial fibrillation ([place])    s/p atrial fib ablation 11-11-11.    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[place])    Squamous carcinoma summer 2015   back of left thigh   Wrist fracture    right   Past Surgical History:  Procedure Laterality Date   ATRIAL FIBRILLATION ABLATION N/A 11/11/2011   Procedure: ATRIAL FIBRILLATION ABLATION;  Surgeon: Thompson Grayer, MD;  Location: [facility] CATH LAB;  Service: Cardiovascular;  Laterality: N/A;   BREAST BIOPSY  1998   BREAST SURGERY     benign cyst removed   CARDIAC ELECTROPHYSIOLOGY STUDY AND ABLATION  5/14, 7/14   convergent AF ablation at [facility] and subsequent atypical atrial flutter ablation by Dr Lehman Prom   CARDIOVASCULAR STRESS TEST  06-06-2008   EF 73%   CARDIOVERSION  09/09/2011   Procedure: CARDIOVERSION;  Surgeon: Darlin Coco, MD;  Location: [facility] ENDOSCOPY;   Service: Cardiovascular;  Laterality: N/A;  to be done by dr. Mare Ferrari   CARDIOVERSION  02/18/2012   Procedure: CARDIOVERSION;  Surgeon: Thayer Headings, MD;  [facility] ENDOSCOPY;  Service: Cardiovascular;  Laterality: N/A;   [place]  2011   metal pin in place   FOREARM SURGERY Left 2010   "opened arm to drain it"   INSERTION OF MESH N/A 11/24/2013   Procedure: INSERTION OF MESH;  Surgeon: Michael Boston, MD;  Location: [facility] ORS;  Service: General;  Laterality: N/A;   KNEE SURGERY Left 2001   Left unicompartmental knee     Dr. Alvan Dame 11/18/16   PACEMAKER PLACEMENT  10/20/12   MDT Advisa DR implanted by Dr Lehman Prom at [place] Left 11/18/2016   Procedure: LEFT UNICOMPARTMENTAL KNEE Medially;  Surgeon: Paralee Cancel, MD;  Location: [facility] ORS;  Service: Orthopedics;  Laterality: Left;  90 mins   PPM GENERATOR CHANGEOUT N/A 07/17/2021   Procedure: PPM GENERATOR CHANGEOUT;  Surgeon: Constance Haw, MD;  Location: [facility] CV LAB;  Service: Cardiovascular;  Laterality: N/A;   RIGHT HEART CATH N/A 06/13/2021   Procedure: RIGHT HEART CATH;  Surgeon: Larey Dresser, MD;  Location: [place] CV LAB;  Service: Cardiovascular;  Laterality: N/A;   SKIN CANCER DESTRUCTION  summer 2015   TEE WITHOUT CARDIOVERSION  11/10/2011   Procedure: TRANSESOPHAGEAL ECHOCARDIOGRAM (TEE);  Surgeon: Thayer Headings, MD;  Location: [facility];  Service: Cardiovascular;  Laterality: N/A;   TONSILLECTOMY  as child   US ECHOCARDIOGRAPHY  03-10-2008   Est EF 55-60%, Dr. Cathie Olden   VENTRAL HERNIA REPAIR N/A 11/24/2013   Procedure: LAPAROSCOPIC VENTRAL HERNIA;  Surgeon: Michael Boston, MD;  Location: [facility] ORS;  Service: General;  Laterality: N/A;   WRIST SURGERY Right ~2009   metal rod in place    FAMILY HISTORY Family History  Problem Relation Age of Onset   Alzheimer's disease Mother    Heart failure Father    Healthy Child    Healthy Child    Breast  cancer Maternal Aunt     SOCIAL HISTORY Social History   Tobacco Use   Smoking status: Never   Smokeless tobacco: Never  Vaping Use   Vaping Use: Never used  Substance Use Topics   Alcohol use: No   Drug use: No         OPHTHALMIC EXAM:  Base Eye Exam     Visual Acuity (Snellen - Linear)       Right Left   Dist [place] 20/60 20/25 +2         Tonometry (Tonopen, 1:55 PM)       Right Left   Pressure 18 18         Pupils       Shape   Right Round   Left          Neuro/Psych     Oriented x3: Yes   Mood/Affect: Normal         Dilation     Right eye: 2.5% Phenylephrine, 1.0% Mydriacyl @ 1:52 PM           Slit Lamp and Fundus Exam     External Exam       Right Left   External Normal Normal         Slit Lamp Exam       Right Left   Lids/Lashes Normal Normal   Conjunctiva/Sclera White and quiet White and quiet   Cornea Clear Clear   Anterior Chamber Deep and quiet Deep and quiet   Iris Round and reactive Round and reactive   Lens Centered posterior chamber intraocular lens Centered posterior chamber intraocular lens   Anterior Vitreous Normal Normal         Fundus Exam       Right Left   Posterior Vitreous Normal    Disc Normal    C/D Ratio 0.3    Macula Macular thickening, Intermediate age related macular degeneration, less  Subretinal hemorrhage, Hard drusen    Vessels Normal    Periphery Normal             IMAGING AND PROCEDURES  Imaging and Procedures for 08/20/21  OCT, Retina - OU - Both Eyes       Right Eye Quality was good. Scan locations included subfoveal. Central Foveal Thickness: 404. Progression has improved. Findings include abnormal foveal contour, cystoid macular edema, intraretinal fluid, pigment epithelial detachment.   Left Eye Quality was good. Scan locations included subfoveal. Central Foveal Thickness: 294. Progression has been stable. Findings include abnormal foveal contour, retinal drusen .    Notes Much less active subfoveal PED with less subretinal fluid and intraretinal fluid but still active 1 month post most recent injection and overall improved over the last 3 months on Eylea now   Given the extent of hemorrhage will deliver reexamination again in a month and will maintain use of Eylea today      Intravitreal Injection, Pharmacologic Agent - OD - Right Eye       Time Out 08/20/2021. 2:29 PM. Confirmed correct patient, procedure, site, and patient consented.   Anesthesia Topical anesthesia was used. Anesthetic medications included Lidocaine 4%.   Procedure Preparation included 5% betadine to ocular surface, 10% betadine to eyelids, Tobramycin 0.3%. A 30 gauge needle was used.   Injection: 2 mg aflibercept 2 MG/0.05ML   Route: Intravitreal, Site: Right Eye   NDC: [id number], Lot: [id number], Expiration date: 02/03/2022, Waste: 0 mL   Post-op Post injection exam found visual acuity of at least counting fingers. The patient tolerated the procedure well. There were no complications. The patient received written and verbal post procedure care education. Post injection medications included ocuflox.              ASSESSMENT/PLAN:  Exudative age-related macular degeneration of right eye with active choroidal neovascularization (HCC) The nature of wet macular degeneration was discussed with the patient.  Forms of therapy reviewed include the use of Anti-VEGF medications injected painlessly into the eye, as well as other possible treatment modalities, including thermal laser therapy. Fellow eye involvement and risks were discussed with the patient. 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 The treatment regimen is on a treat as needed basis with the intent to treat if necessary and extend interval of exams when possible. On average 1 out of 6 patients do not need lifetime therapy. However, the risk of recurrent disease is high  for a lifetime.  Initially monthly, then periodic, examinations and evaluations will determine whether the next treatment is required on the day of the examination.  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 Exudative age-related macular degeneration of right eye with active choroidal neovascularization (HCC)  H35.3211 OCT, Retina - OU - Both Eyes    Intravitreal Injection, Pharmacologic Agent - OD - Right Eye    aflibercept (EYLEA) SOLN 2 mg    2. Intermediate stage nonexudative age-related macular degeneration of both eyes  H35.3132     3. 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  Much less subretinal fluid much smaller pigment epithelial detachment on intravitreal Eylea.  2.  Repeat injection OD today and follow-up again in 1 month approximately  3.  Ophthalmic Meds Ordered this visit:  Meds ordered this encounter  Medications   aflibercept (EYLEA) SOLN 2 mg       Return in about 1 month (around 09/20/2021) for dilate, OD, EYLEA OCT.  There are no Patient Instructions on file for this visit.   Explained the diagnoses, plan, and follow up with the patient and they expressed understanding.  Patient expressed understanding of the importance of proper follow up care.   Clent Demark Sloane Palmer M.D. Diseases & Surgery of the Retina and Vitreous [facility] 08/20/21     Abbreviations: M myopia (nearsighted); A astigmatism; H hyperopia (farsighted); P presbyopia; Mrx spectacle prescription;  CTL contact lenses; OD right eye; OS left eye; OU both eyes  XT exotropia; ET esotropia; PEK punctate epithelial keratitis; PEE punctate epithelial erosions; DES dry eye syndrome; MGD meibomian gland dysfunction; ATs artificial tears; PFAT's preservative  free artificial tears; [place]es nuclear sclerotic cataract; PSC posterior subcapsular cataract; ERM epi-retinal membrane; PVD posterior vitreous detachment; RD retinal detachment; DM diabetes mellitus; DR diabetic retinopathy; NPDR non-proliferative diabetic retinopathy; PDR proliferative diabetic retinopathy; CSME clinically significant macular edema; DME diabetic macular edema; dbh dot blot hemorrhages; CWS cotton wool spot; POAG primary open angle glaucoma; C/D cup-to-disc ratio; HVF humphrey visual field; GVF goldmann visual field; OCT optical coherence tomography; IOP intraocular pressure; BRVO Branch retinal vein occlusion; CRVO central retinal vein occlusion; CRAO central retinal artery occlusion; BRAO branch retinal artery occlusion; RT retinal tear; SB scleral buckle; PPV pars plana vitrectomy; VH Vitreous hemorrhage; PRP panretinal laser photocoagulation; IVK intravitreal kenalog; VMT vitreomacular traction; MH Macular hole;  NVD neovascularization of the disc; NVE neovascularization elsewhere; AREDS age related eye disease study; ARMD age related macular degeneration; POAG primary open angle glaucoma; EBMD epithelial/anterior basement membrane dystrophy; ACIOL anterior chamber intraocular lens; IOL intraocular lens; PCIOL posterior chamber intraocular lens; Phaco/IOL phacoemulsification with intraocular lens placement; Waterproof photorefractive keratectomy; LASIK laser assisted in situ keratomileusis; HTN hypertension; DM diabetes mellitus; COPD chronic obstructive pulmonary disease

## 2021-08-24 DIAGNOSIS — N390 Urinary tract infection, site not specified: Secondary | ICD-10-CM | POA: Diagnosis not present

## 2021-08-26 ENCOUNTER — Encounter (HOSPITAL_COMMUNITY): Payer: Self-pay

## 2021-09-04 DIAGNOSIS — N390 Urinary tract infection, site not specified: Secondary | ICD-10-CM | POA: Diagnosis not present

## 2021-09-07 ENCOUNTER — Other Ambulatory Visit: Payer: Self-pay | Admitting: Neurology

## 2021-09-09 ENCOUNTER — Other Ambulatory Visit: Payer: Self-pay

## 2021-09-09 DIAGNOSIS — G20 Parkinson's disease: Secondary | ICD-10-CM

## 2021-09-09 MED ORDER — CARBIDOPA-LEVODOPA 25-100 MG PO TABS: 2.0000 | ORAL_TABLET | Freq: Three times a day (TID) | ORAL | 0 refills | Status: DC

## 2021-09-11 ENCOUNTER — Ambulatory Visit (HOSPITAL_COMMUNITY)
Admission: RE | Admit: 2021-09-11 | Discharge: 2021-09-11 | Disposition: A | Discharge status: Home / Self Care | Payer: Medicare Other | Source: Ambulatory Visit | Attending: Adult Health | Admitting: Adult Health

## 2021-09-11 ENCOUNTER — Encounter (HOSPITAL_COMMUNITY): Payer: Self-pay

## 2021-09-11 VITALS — BP 104/64 | HR 81 | Wt 128.8 lb

## 2021-09-11 DIAGNOSIS — Z7901 Long term (current) use of anticoagulants: Secondary | ICD-10-CM | POA: Diagnosis not present

## 2021-09-11 DIAGNOSIS — Z7984 Long term (current) use of oral hypoglycemic drugs: Secondary | ICD-10-CM | POA: Diagnosis not present

## 2021-09-11 DIAGNOSIS — N390 Urinary tract infection, site not specified: Secondary | ICD-10-CM | POA: Diagnosis not present

## 2021-09-11 DIAGNOSIS — I495 Sick sinus syndrome: Secondary | ICD-10-CM | POA: Diagnosis not present

## 2021-09-11 DIAGNOSIS — I5032 Chronic diastolic (congestive) heart failure: Secondary | ICD-10-CM

## 2021-09-11 DIAGNOSIS — G20 Parkinson's disease: Secondary | ICD-10-CM | POA: Diagnosis not present

## 2021-09-11 DIAGNOSIS — I480 Paroxysmal atrial fibrillation: Secondary | ICD-10-CM | POA: Diagnosis not present

## 2021-09-11 DIAGNOSIS — R0602 Shortness of breath: Secondary | ICD-10-CM | POA: Diagnosis not present

## 2021-09-11 DIAGNOSIS — Z79899 Other long term (current) drug therapy: Secondary | ICD-10-CM | POA: Diagnosis not present

## 2021-09-11 DIAGNOSIS — K7460 Unspecified cirrhosis of liver: Secondary | ICD-10-CM | POA: Insufficient documentation

## 2021-09-11 DIAGNOSIS — I272 Pulmonary hypertension, unspecified: Secondary | ICD-10-CM

## 2021-09-11 DIAGNOSIS — I110 Hypertensive heart disease with heart failure: Secondary | ICD-10-CM | POA: Diagnosis not present

## 2021-09-11 NOTE — Progress Notes (Addendum)
PCP: Crist Infante, MD EP: Dr. Curt Bears HF Cardiology: Dr. Aundra Dubin  81 y.o. 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  Patient had an echo done in 3/23 showing EF 65-70%, mild LVH, normal RV, PASP 60 mmHg, moderate biatrial enlargement, mild MR, moderate TR. Prior echoes had shown mildly elevated PA pressure. Cardiolite in 10/21 showed no ischemia.   RHC was done, showing mild pulmonary venous hypertension with mildly elevated PCWP. She was started on Farxiga.   07/17/21 S/P Gen change out   Today she returns for HF follow up. Last visit lasix increased to 80 mg daily. Currently being treated for UTI. Overall feeling fine. Denies SOB/PND/Orthopnea. Mild SOB with inclines. Appetite ok. No fever or chills. She does not weigh at home. Taking all medications.  MDT PPM interrogation: 6.2 hours activity. A fib 5.9%  Labs (3/23): hgb 9.7 Labs (4/23): K 3.2, creatinine 0.92 Labs (07/2021): creatinine 0.8   PMH: 1. Atrial fibrillation: Paroxysmal.  S/p AF ablation in 10/13.  2. Tachy-brady syndrome: Medtronic PPM placed in 9/14.  3. H/o atypical atrial flutter 4. Hyperlipidemia 5. HTN 6. Hypothyroidism 7. Parkinsons disease: Followed by Dr. Carles Collet 8. Cirrhosis: ?NAFLD. No ETOH.  9. Chronic diastolic CHF/pulmonary hypertension:  - LHC (2014): No significant CAD.  - Cardiolite (10/21): EF 71%, no ischemia.  - Echo (3/23): EF 65-70%, mild LVH, normal RV, PASP 60 mmHg, moderate biatrial enlargement, mild MR, moderate TR.  - RHC (5/23): mean RA 8, PA 49/13 mean 30, mean PCWP 19 with v-waves to 30, CI 3.96, PVR 1.8 WU.  10. B12 deficiency.   Social History   Socioeconomic History   Marital status: Married    Spouse name: Not on file   Number of children: 2   Years of education: Not on file   Highest education level:  Master's degree (e.g., MA, MS, MEng, MEd, MSW, MBA)  Occupational History   Not on file  Tobacco Use   Smoking status: Never   Smokeless tobacco: Never  Vaping Use   Vaping Use: Never used  Substance and Sexual Activity   Alcohol use: No   Drug use: No   Sexual activity: Yes    Partners: Male    Birth control/protection: Post-menopausal  Other Topics Concern   Not on file  Social History Narrative   Lives in [place].  Retired Licensed conveyancer.   Social Determinants of Health   Financial Resource Strain: Not on file  Food Insecurity: Not on file  Transportation Needs: Not on file  Physical Activity: Not on file  Stress: Not on file  Social Connections: Not on file  Intimate Partner Violence: Not on file   Family History  Problem Relation Age of Onset   Alzheimer's disease Mother    Heart failure Father    Healthy Child    Healthy Child    Breast cancer Maternal Aunt    ROS: All systems reviewed and negative except as per HPI.   Current Outpatient Medications  Medication Sig Dispense Refill   Acetylcysteine (NAC 600) 600 MG CAPS Take 600 mg by mouth daily.     apixaban (ELIQUIS) 5 MG TABS tablet TAKE 1 TABLET BY MOUTH TWICE A DAY 60 tablet 6   BELSOMRA 20 MG TABS Take 1 tablet by mouth at bedtime as needed (sleep).     CALCIUM PO Take 1 tablet by mouth at  bedtime.     carbidopa-levodopa (SINEMET CR) 50-200 MG tablet TAKE 1 TABLET BY MOUTH EVERYDAY AT BEDTIME 90 tablet 1   carbidopa-levodopa (SINEMET IR) 25-100 MG tablet TAKE 2 TABLETS BY MOUTH 3 TIMES DAILY. 540 tablet 1   cholecalciferol (VITAMIN D3) 25 MCG (1000 UNIT) tablet Take 1,000 Units by mouth daily.     cyanocobalamin (,VITAMIN B-12,) 1000 MCG/ML injection INJECT 1ML INTRAMUSCULARLY IN THE DELTOID ONCE A MONTH (ALTERNATING DELT. EACH MONTH)     dapagliflozin propanediol (FARXIGA) 10 MG TABS tablet Take 1 tablet (10 mg total) by mouth daily before breakfast. 30 tablet 11   docusate sodium (COLACE) 100 MG capsule  Take 100 mg by mouth in the morning and at bedtime.     ezetimibe (ZETIA) 10 MG tablet Take 10 mg by mouth every other day. Taking the same day as the crestor     furosemide (LASIX) 80 MG tablet Take 80 mg by mouth daily.     levothyroxine (SYNTHROID) 50 MCG tablet Take 50-75 mcg by mouth See admin instructions. Pt take 1 1/2 tablet (75 mcg) on Monday and Thursday and 1 tablet (50 mcg)  the rest of the week     magnesium oxide (MAG-OX) 400 MG tablet Take 400 mg by mouth at bedtime.     metoprolol succinate (TOPROL-XL) 25 MG 24 hr tablet TAKE 1 TABLET BY MOUTH EVERY DAY 90 tablet 3   multivitamin-lutein (OCUVITE-LUTEIN) CAPS capsule Take 1 capsule by mouth daily.     NON FORMULARY Take 2 tablets by mouth daily. Liposomal Glutathione take     OVER THE COUNTER MEDICATION Take 1 capsule by mouth daily. MetaCalm     polycarbophil (FIBERCON) 625 MG tablet Take 1,250 mg by mouth daily.     potassium chloride (KLOR-CON M) 10 MEQ tablet Take 2 tablets (20 mEq total) by mouth every morning AND 1 tablet (10 mEq total) every evening. 90 tablet 6   Probiotic Product (PROBIOTIC-10 PO) Take 1 tablet by mouth daily. Lafonda Mosses Brand     Propylene Glycol (SYSTANE BALANCE) 0.6 % SOLN Place 1 drop into both eyes daily as needed (dry eyes).     raloxifene (EVISTA) 60 MG tablet Take 60 mg by mouth daily.     rasagiline (AZILECT) 1 MG TABS tablet Take 1 tablet (1 mg total) by mouth daily. 90 tablet 1   rosuvastatin (CRESTOR) 5 MG tablet Take 2.5 mg by mouth every other day.     valACYclovir (VALTREX) 1000 MG tablet Take 1,000 mg by mouth 2 (two) times daily as needed (fever blisters).     vitamin B-12 (CYANOCOBALAMIN) 100 MCG tablet Take 100 mcg by mouth daily.     vitamin C (ASCORBIC ACID) 500 MG tablet Take 500 mg by mouth daily.     No current facility-administered medications for this encounter.   BP 104/64   Pulse 81   Wt 58.4 kg (128 lb 12.8 oz)   LMP 02/04/1996   SpO2 92%   BMI 23.18 kg/m  Vitals:    09/11/21 1050  BP: 104/64  Pulse: 81  SpO2: 92%   Wt Readings from Last 3 Encounters:  09/11/21 58.4 kg (128 lb 12.8 oz)  07/31/21 57.2 kg (126 lb 3.2 oz)  07/23/21 57.6 kg (127 lb)    General:  Well appearing. No resp difficulty. Walked in the clinic.  HEENT: normal Neck: supple. no JVD. Carotids 2+ bilat; no bruits. No lymphadenopathy or thryomegaly appreciated. Cor: PMI nondisplaced. Regular rate & rhythm. No  rubs, gallops or murmurs. Lungs: clear Abdomen: soft, nontender, nondistended. No hepatosplenomegaly. No bruits or masses. Good bowel sounds. Extremities: no cyanosis, clubbing, rash, edema Neuro: alert & orientedx3, cranial nerves grossly intact. moves all 4 extremities w/o difficulty. Affect pleasant   Assessment/Plan: 1. Pulmonary hypertension: Patient was noted on 3/23 echo to have moderately elevated PA pressure (60 mmHg).  Prior echoes had shown mildly elevated PA pressure.  RV was normal on this echo, there was moderate TR.  RHC in 5/23 showed mild pulmonary venous hypertension (group 2) with mildly elevated PCWP.  Diastolic LV dysfunction with elevated LA pressure (pulmonary venous hypertension) is the most likely etiology.   - 2. Chronic diastolic CHF: RHC in 9/32 showed elevated PCWP with prominent v-waves.  Echo reviewed, no more than mild MR so suspect elevated v-waves due to diastolic dysfunction.  She had pulmonary venous hypertension.   -NYHA II-III.  - Volume status stable. Continue lasix to 80 mg daily and KCL to 20 qam/10 qpm. - Continue dapagliflozin 10 mg daily.  3. Atrial fibrillation: Paroxysmal. She is rate-controlled atrial fibrillation today, but device only records a  5.9% burden.   - Continue Eliquis. No bleeding issues.  - Continue Toprol XL.  4. Tachy-brady syndrome: Has MDT PPM. Had gen change 07/17/21 5. Cirrhosis: ?NAFLD.  Does not drink any ETOH now, was not a heavy drinker before.  - Followed by [facility].   Discussed previous ECHO, NYHA  functional class, and RHC from earlier this year. Encourage to walk daily as tolerated.   Follow up with Dr Aundra Dubin in 3-4 months.   Greater than 50% of the (total minutes 35) visit spent in counseling/coordination of care regarding the above.    Farouk Vivero NP-C  09/11/2021

## 2021-09-11 NOTE — Patient Instructions (Signed)
[place]  Your physician recommends that you schedule a follow-up appointment in: 3 months  Do the following things EVERYDAY: Weigh yourself in the morning before breakfast. Write it down and keep it in a log. Take your medicines as prescribed Eat low salt foods--Limit salt (sodium) to 2000 mg per day.  Stay as active as you can everyday Limit all fluids for the day to less than 2 liters   If you have any questions or concerns before your next appointment please send [place] a message through [place] or call our office at [phone].    TO LEAVE A MESSAGE FOR THE NURSE SELECT OPTION 2, PLEASE LEAVE A MESSAGE INCLUDING: YOUR NAME DATE OF BIRTH CALL BACK NUMBER REASON FOR CALL**this is important as we prioritize the call backs  YOU WILL RECEIVE A CALL BACK THE SAME DAY AS LONG AS YOU CALL BEFORE 4:00 PM   At the [facility], you and your health needs are our priority. As part of our continuing mission to provide you with exceptional heart care, we have created designated Provider Care Teams. These Care Teams include your primary Cardiologist (physician) and Advanced Practice Providers (APPs- Physician Assistants and Nurse Practitioners) who all work together to provide you with the care you need, when you need it.   You may see any of the following providers on your designated Care Team at your next follow up: Dr Glori Bickers Dr Haynes Kerns, NP Lyda Jester, [place] [facility] [place], [place] Audry Riles, PharmD   Please be sure to bring in all your medications bottles to every appointment.

## 2021-09-17 DIAGNOSIS — N390 Urinary tract infection, site not specified: Secondary | ICD-10-CM | POA: Diagnosis not present

## 2021-09-19 ENCOUNTER — Encounter (INDEPENDENT_AMBULATORY_CARE_PROVIDER_SITE_OTHER): Payer: Medicare Other | Admitting: Ophthalmology

## 2021-10-01 ENCOUNTER — Encounter: Payer: Self-pay | Admitting: Cardiology

## 2021-10-01 ENCOUNTER — Encounter (INDEPENDENT_AMBULATORY_CARE_PROVIDER_SITE_OTHER): Payer: Medicare Other | Admitting: Ophthalmology

## 2021-10-08 ENCOUNTER — Ambulatory Visit (INDEPENDENT_AMBULATORY_CARE_PROVIDER_SITE_OTHER): Payer: Medicare Other | Admitting: Ophthalmology

## 2021-10-08 ENCOUNTER — Encounter (INDEPENDENT_AMBULATORY_CARE_PROVIDER_SITE_OTHER): Payer: Self-pay | Admitting: Ophthalmology

## 2021-10-08 DIAGNOSIS — H353211 Exudative age-related macular degeneration, right eye, with active choroidal neovascularization: Secondary | ICD-10-CM

## 2021-10-08 DIAGNOSIS — H35721 Serous detachment of retinal pigment epithelium, right eye: Secondary | ICD-10-CM

## 2021-10-08 MED ORDER — AFLIBERCEPT 2MG/0.05ML IZ SOLN FOR KALEIDOSCOPE
2.0000 mg | INTRAVITREAL | Status: AC | PRN
  Administered 2021-10-08: 2 mg via INTRAVITREAL

## 2021-10-08 NOTE — Progress Notes (Signed)
10/08/2021     CHIEF COMPLAINT Patient presents for  Chief Complaint  Patient presents with   Macular Degeneration      HISTORY OF PRESENT ILLNESS: [name] [name] is a 81 y.o. female who presents to the clinic today for:   HPI   1 mth dilate od eylea oct Pt states her vision has been stable Pt denies any new floaters or FOL  Last edited by Morene Rankins, CMA on 10/08/2021  4:08 PM.      Referring physician: Crist Infante, MD [place],  [place] [address]  HISTORICAL INFORMATION:   Selected notes from the [facility]    Lab Results  Component Value Date   HGBA1C  05/21/2007    5.6 (NOTE)   The ADA recommends the following therapeutic goals for glycemic   control related to Hgb A1C measurement:   Goal of Therapy:   < 7.0% Hgb A1C   Action Suggested:  > 8.0% Hgb A1C   Ref:  Diabetes Care, 22, Suppl. 1, 1999     CURRENT MEDICATIONS: Current Outpatient Medications (Ophthalmic Drugs)  Medication Sig   Propylene Glycol (SYSTANE BALANCE) 0.6 % SOLN Place 1 drop into both eyes daily as needed (dry eyes).   No current facility-administered medications for this visit. (Ophthalmic Drugs)   Current Outpatient Medications (Other)  Medication Sig   Acetylcysteine (NAC 600) 600 MG CAPS Take 600 mg by mouth daily.   apixaban (ELIQUIS) 5 MG TABS tablet TAKE 1 TABLET BY MOUTH TWICE A DAY   BELSOMRA 20 MG TABS Take 1 tablet by mouth at bedtime as needed (sleep).   CALCIUM PO Take 1 tablet by mouth at bedtime.   carbidopa-levodopa (SINEMET CR) 50-200 MG tablet TAKE 1 TABLET BY MOUTH EVERYDAY AT BEDTIME   carbidopa-levodopa (SINEMET IR) 25-100 MG tablet TAKE 2 TABLETS BY MOUTH 3 TIMES DAILY.   cholecalciferol (VITAMIN D3) 25 MCG (1000 UNIT) tablet Take 1,000 Units by mouth daily.   cyanocobalamin (,VITAMIN B-12,) 1000 MCG/ML injection INJECT 1ML INTRAMUSCULARLY IN THE DELTOID ONCE A MONTH (ALTERNATING DELT. EACH MONTH)   docusate sodium (COLACE) 100 MG  capsule Take 100 mg by mouth in the morning and at bedtime.   ezetimibe (ZETIA) 10 MG tablet Take 10 mg by mouth every other day. Taking the same day as the crestor   furosemide (LASIX) 80 MG tablet Take 80 mg by mouth daily.   levothyroxine (SYNTHROID) 50 MCG tablet Take 50-75 mcg by mouth See admin instructions. Pt take 1 1/2 tablet (75 mcg) on Monday and Thursday and 1 tablet (50 mcg)  the rest of the week   magnesium oxide (MAG-OX) 400 MG tablet Take 400 mg by mouth at bedtime.   metoprolol succinate (TOPROL-XL) 25 MG 24 hr tablet TAKE 1 TABLET BY MOUTH EVERY DAY   multivitamin-lutein (OCUVITE-LUTEIN) CAPS capsule Take 1 capsule by mouth daily.   NON FORMULARY Take 2 tablets by mouth daily. Liposomal Glutathione take   OVER THE COUNTER MEDICATION Take 1 capsule by mouth daily. MetaCalm   polycarbophil (FIBERCON) 625 MG tablet Take 1,250 mg by mouth daily.   potassium chloride (KLOR-CON M) 10 MEQ tablet Take 2 tablets (20 mEq total) by mouth every morning AND 1 tablet (10 mEq total) every evening.   Probiotic Product (PROBIOTIC-10 PO) Take 1 tablet by mouth daily. Lafonda Mosses Brand   raloxifene (EVISTA) 60 MG tablet Take 60 mg by mouth daily.   rasagiline (AZILECT) 1 MG TABS tablet Take  1 tablet (1 mg total) by mouth daily.   rosuvastatin (CRESTOR) 5 MG tablet Take 2.5 mg by mouth every other day.   valACYclovir (VALTREX) 1000 MG tablet Take 1,000 mg by mouth 2 (two) times daily as needed (fever blisters).   vitamin B-12 (CYANOCOBALAMIN) 100 MCG tablet Take 100 mcg by mouth daily.   vitamin C (ASCORBIC ACID) 500 MG tablet Take 500 mg by mouth daily.   No current facility-administered medications for this visit. (Other)      REVIEW OF SYSTEMS: ROS   Negative for: Constitutional, Gastrointestinal, Neurological, Skin, Genitourinary, Musculoskeletal, HENT, Endocrine, Cardiovascular, Eyes, Respiratory, Psychiatric, Allergic/Imm, Heme/Lymph Last edited by Morene Rankins, CMA on 10/08/2021  4:08  PM.       ALLERGIES Allergies  Allergen Reactions   Amiodarone Other (See Comments)    Made pt feel terrible    Statins     Leg weakness - but tolerating low dose Crestor every other day    PAST MEDICAL HISTORY Past Medical History:  Diagnosis Date   Anemia    Arthritis    thumbs   Atrial tachycardia (HCC)    Atypical atrial flutter (HCC)    Cataract    bilateral   Chronic anticoagulation    on Xarelto   Chronic diastolic CHF (congestive heart failure) (HCC)    Echocardiogram (11/24/12): Mild LVH, EF 60%.   Dysrhythmia    GERD (gastroesophageal reflux disease)    hx of no meds   Heart murmur    slight   Hyperlipidemia    a. Hx of leg weakness while on statin. under control   Hypertension    Hypothyroidism    Leg fracture Dec 21,2011   Osteoporosis 07/2007   Parkinson's disease ([place]) 2011   Persistent atrial fibrillation ([place])    s/p atrial fib ablation 11-11-11.    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[place])    Squamous carcinoma summer 2015   back of left thigh   Wrist fracture    right   Past Surgical History:  Procedure Laterality Date   ATRIAL FIBRILLATION ABLATION N/A 11/11/2011   Procedure: ATRIAL FIBRILLATION ABLATION;  Surgeon: Thompson Grayer, MD;  Location: [facility] CATH LAB;  Service: Cardiovascular;  Laterality: N/A;   BREAST BIOPSY  1998   BREAST SURGERY     benign cyst removed   CARDIAC ELECTROPHYSIOLOGY STUDY AND ABLATION  5/14, 7/14   convergent AF ablation at [facility] and subsequent atypical atrial flutter ablation by Dr Lehman Prom   CARDIOVASCULAR STRESS TEST  06-06-2008   EF 73%   CARDIOVERSION  09/09/2011   Procedure: CARDIOVERSION;  Surgeon: Darlin Coco, MD;  Location: [facility] ENDOSCOPY;  Service: Cardiovascular;  Laterality: N/A;  to be done by dr. Mare Ferrari   CARDIOVERSION  02/18/2012    Procedure: CARDIOVERSION;  Surgeon: Thayer Headings, MD;  Location: [facility] ENDOSCOPY;  Service: Cardiovascular;  Laterality: N/A;   [place]  2011   metal pin in place   FOREARM SURGERY Left 2010   "opened arm to drain it"   INSERTION OF MESH N/A 11/24/2013   Procedure: INSERTION OF MESH;  Surgeon: Michael Boston, MD;  Location: [facility] ORS;  Service: General;  Laterality: N/A;   KNEE SURGERY Left 2001   Left unicompartmental knee     Dr.  Alvan Dame 11/18/16   PACEMAKER PLACEMENT  10/20/12   MDT Advisa DR implanted by Dr Lehman Prom at [place] Left 11/18/2016   Procedure: LEFT UNICOMPARTMENTAL KNEE Medially;  Surgeon: Paralee Cancel, MD;  Location: [facility] ORS;  Service: Orthopedics;  Laterality: Left;  90 mins   PPM GENERATOR CHANGEOUT N/A 07/17/2021   Procedure: PPM GENERATOR CHANGEOUT;  Surgeon: Constance Haw, MD;  Location: [facility] CV LAB;  Service: Cardiovascular;  Laterality: N/A;   RIGHT HEART CATH N/A 06/13/2021   Procedure: RIGHT HEART CATH;  Surgeon: Larey Dresser, MD;  Location: [place] CV LAB;  Service: Cardiovascular;  Laterality: N/A;   SKIN CANCER DESTRUCTION  summer 2015   TEE WITHOUT CARDIOVERSION  11/10/2011   Procedure: TRANSESOPHAGEAL ECHOCARDIOGRAM (TEE);  Surgeon: Thayer Headings, MD;  Location: [facility] ENDOSCOPY;  Service: Cardiovascular;  Laterality: N/A;   TONSILLECTOMY  as child   US ECHOCARDIOGRAPHY  03-10-2008   Est EF 55-60%, Dr. Cathie Olden   VENTRAL HERNIA REPAIR N/A 11/24/2013   Procedure: LAPAROSCOPIC VENTRAL HERNIA;  Surgeon: Michael Boston, MD;  Location: [facility] ORS;  Service: General;  Laterality: N/A;   WRIST SURGERY Right ~2009   metal rod in place    FAMILY HISTORY Family History  Problem Relation Age of Onset   Alzheimer's disease Mother    Heart failure Father    Healthy Child    Healthy Child    Breast cancer Maternal Aunt     SOCIAL HISTORY Social History   Tobacco Use   Smoking status: Never    Smokeless tobacco: Never  Vaping Use   Vaping Use: Never used  Substance Use Topics   Alcohol use: No   Drug use: No         OPHTHALMIC EXAM:  Base Eye Exam     Visual Acuity (ETDRS)       Right Left   Dist cc 20/60 +2 20/25 -1         Tonometry (Tonopen, 4:14 PM)       Right Left   Pressure 12 12         Visual Fields       Left Right    Full Full         Extraocular Movement       Right Left    Ortho Ortho    -- -- --  --  --  -- -- --   -- -- --  --  --  -- -- --           Neuro/Psych     Oriented x3: Yes   Mood/Affect: Normal         Dilation     Right eye: 2.5% Phenylephrine, 1.0% Mydriacyl @ 4:09 PM           Slit Lamp and Fundus Exam     External Exam       Right Left   External Normal Normal         Slit Lamp Exam       Right Left   Lids/Lashes Normal Normal   Conjunctiva/Sclera White and quiet White and quiet   Cornea Clear Clear   Anterior Chamber Deep and quiet Deep and quiet   Iris Round and reactive Round and reactive   Lens Centered posterior chamber intraocular lens Centered posterior chamber intraocular lens   Anterior Vitreous Normal Normal         Fundus Exam  Right Left   Posterior Vitreous Normal    Disc Normal    C/D Ratio 0.3    Macula Macular thickening, Intermediate age related macular degeneration, less Subretinal hemorrhage, Hard drusen    Vessels Normal    Periphery Normal             IMAGING AND PROCEDURES  Imaging and Procedures for 10/08/21  OCT, Retina - OU - Both Eyes       Right Eye Quality was good. Scan locations included subfoveal. Central Foveal Thickness: 388. Progression has improved. Findings include abnormal foveal contour, cystoid macular edema, intraretinal fluid, pigment epithelial detachment.   Left Eye Quality was good. Scan locations included subfoveal. Central Foveal Thickness: 294. Progression has been stable. Findings include abnormal foveal  contour, retinal drusen .   Notes Much less active subfoveal PED with less subretinal fluid and intraretinal fluid but still active 1 month post most recent injection and overall improved over the last 4 months on Eylea now   Given the extent of hemorrhage will deliver reexamination again in a month and will maintain use of Eylea today      Intravitreal Injection, Pharmacologic Agent - OD - Right Eye       Time Out 10/08/2021. 5:28 PM. Confirmed correct patient, procedure, site, and patient consented.   Anesthesia Topical anesthesia was used. Anesthetic medications included Lidocaine 4%.   Procedure Preparation included 5% betadine to ocular surface, 10% betadine to eyelids, Tobramycin 0.3%. A 30 gauge needle was used.   Injection: 2 mg aflibercept 2 MG/0.05ML   Route: Intravitreal, Site: Right Eye   NDC: [id number], Lot: [id number], Expiration date: 10/06/2022, Waste: 0 mL   Post-op Post injection exam found visual acuity of at least counting fingers. The patient tolerated the procedure well. There were no complications. The patient received written and verbal post procedure care education. Post injection medications included ocuflox.              ASSESSMENT/PLAN:  Exudative age-related macular degeneration of right eye with active choroidal neovascularization (HCC) The nature of wet macular degeneration was discussed with the patient.  Forms of therapy reviewed include the use of Anti-VEGF medications injected painlessly into the eye, as well as other possible treatment modalities, including thermal laser therapy. Fellow eye involvement and risks were discussed with the patient. 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 The treatment regimen is on a treat as needed basis with the intent to treat if necessary and extend interval of exams when possible. On average 1 out of 6 patients do not need lifetime therapy. However, the risk of  recurrent disease is high for a lifetime.  Initially monthly, then periodic, examinations and evaluations will determine whether the next treatment is required on the day of the examination.  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 Exudative age-related macular degeneration of right eye with active choroidal neovascularization (HCC)  H35.3211 OCT, Retina - OU - Both Eyes    Intravitreal Injection, Pharmacologic Agent - OD - Right Eye    aflibercept (EYLEA) SOLN 2 mg    2. 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  OD with wet AMD, CNVM much less active with vascularized PED is less subretinal fluid is still active.  Currently at 7-week interval.  We will repeat  injection today and follow-up next in 8 weeks  2.  3.  Ophthalmic Meds Ordered this visit:  Meds ordered this encounter  Medications   aflibercept (EYLEA) SOLN 2 mg       Return in about 8 weeks (around 12/03/2021) for dilate, EYLEA OCT.  There are no Patient Instructions on file for this visit.   Explained the diagnoses, plan, and follow up with the patient and they expressed understanding.  Patient expressed understanding of the importance of proper follow up care.   Clent Demark Salvator Seppala M.D. Diseases & Surgery of the Retina and Vitreous [facility] [place] 10/08/21     Abbreviations: M myopia (nearsighted); A astigmatism; H hyperopia (farsighted); P presbyopia; Mrx spectacle prescription;  CTL contact lenses; OD right eye; OS left eye; OU both eyes  XT exotropia; ET esotropia; PEK punctate epithelial keratitis; PEE punctate epithelial erosions; DES dry eye syndrome; MGD meibomian gland dysfunction; ATs artificial tears; PFAT's preservative free artificial tears; [place] nuclear sclerotic cataract; PSC posterior subcapsular cataract; ERM epi-retinal membrane; PVD posterior  vitreous detachment; RD retinal detachment; DM diabetes mellitus; DR diabetic retinopathy; NPDR non-proliferative diabetic retinopathy; PDR proliferative diabetic retinopathy; CSME clinically significant macular edema; DME diabetic macular edema; dbh dot blot hemorrhages; CWS cotton wool spot; POAG primary open angle glaucoma; C/D cup-to-disc ratio; HVF humphrey visual field; GVF goldmann visual field; OCT optical coherence tomography; IOP intraocular pressure; BRVO Branch retinal vein occlusion; CRVO central retinal vein occlusion; CRAO central retinal artery occlusion; BRAO branch retinal artery occlusion; RT retinal tear; SB scleral buckle; PPV pars plana vitrectomy; VH Vitreous hemorrhage; PRP panretinal laser photocoagulation; IVK intravitreal kenalog; VMT vitreomacular traction; MH Macular hole;  NVD neovascularization of the disc; NVE neovascularization elsewhere; AREDS age related eye disease study; ARMD age related macular degeneration; POAG primary open angle glaucoma; EBMD epithelial/anterior basement membrane dystrophy; ACIOL anterior chamber intraocular lens; IOL intraocular lens; PCIOL posterior chamber intraocular lens; Phaco/IOL phacoemulsification with intraocular lens placement; [place] photorefractive keratectomy; LASIK laser assisted in situ keratomileusis; HTN hypertension; DM diabetes mellitus; COPD chronic obstructive pulmonary disease

## 2021-10-08 NOTE — Assessment & Plan Note (Signed)
OD with much less subretinal fluid and intraretinal fluid 7-week follow-up interval

## 2021-10-08 NOTE — Assessment & Plan Note (Signed)

## 2021-10-11 NOTE — Progress Notes (Unsigned)
Assessment/Plan:   1.  Parkinsons Disease  -*** carbidopa/levodopa 25/100, 2 tablets three times per day and move dosages to 8am/noon/4pm  -Continue carbidopa/levodopa 50/200 at bedtime  -Continue rasagiline, 1 mg daily  -Has moderate Parkinson's dyskinesia (when on - she is off today), but really not bothersome to her and she does not even notice it most of the time.  Therefore, no treatment required.  2.  RBD/insomnia  -On clonazepam, 0.5 mg, half tablet at bedtime.  -add melatonin, 3 mg at bed. 3.  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 4.  Atrial fibrillation  -Follows with cardiology.  On Eliquis.  -Patient with history of tachybradycardia syndrome and follows with cardiology for this as well.  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   Subjective:   [name] [name] was seen today in follow up for Parkinsons disease.  My previous records were reviewed prior to todays visit as well as outside records available to me.  She is with her husband who supplements the history.  Last visit, I slightly increased her levodopa and told her to move the dosages closer together.  She reports today that ***.  Medical records reviewed.  Patient had her pacemaker changed out on June 14.  She saw Dr. Zadie Rhine last week for macular degeneration.   Current prescribed movement disorder medications: Carbidopa/levodopa 25/100, 2 tablets 3 times per day (increased) Carbidopa/levodopa 50/200 at bedtime azilect Klonopin, 1/2 po q hs (PDMP reveals no red flags.  Last filled October 10, 2020)    ALLERGIES:   Allergies  Allergen Reactions   Amiodarone Other (See Comments)    Made pt feel terrible    Statins     Leg weakness - but tolerating low dose Crestor every other day    CURRENT MEDICATIONS:  Outpatient Encounter Medications as of 10/15/2021  Medication Sig   Acetylcysteine (NAC 600) 600 MG CAPS Take 600 mg by mouth daily.   apixaban  (ELIQUIS) 5 MG TABS tablet TAKE 1 TABLET BY MOUTH TWICE A DAY   BELSOMRA 20 MG TABS Take 1 tablet by mouth at bedtime as needed (sleep).   CALCIUM PO Take 1 tablet by mouth at bedtime.   carbidopa-levodopa (SINEMET CR) 50-200 MG tablet TAKE 1 TABLET BY MOUTH EVERYDAY AT BEDTIME   carbidopa-levodopa (SINEMET IR) 25-100 MG tablet TAKE 2 TABLETS BY MOUTH 3 TIMES DAILY.   cholecalciferol (VITAMIN D3) 25 MCG (1000 UNIT) tablet Take 1,000 Units by mouth daily.   cyanocobalamin (,VITAMIN B-12,) 1000 MCG/ML injection INJECT 1ML INTRAMUSCULARLY IN THE DELTOID ONCE A MONTH (ALTERNATING DELT. EACH MONTH)   docusate sodium (COLACE) 100 MG capsule Take 100 mg by mouth in the morning and at bedtime.   ezetimibe (ZETIA) 10 MG tablet Take 10 mg by mouth every other day. Taking the same day as the crestor   furosemide (LASIX) 80 MG tablet Take 80 mg by mouth daily.   levothyroxine (SYNTHROID) 50 MCG tablet Take 50-75 mcg by mouth See admin instructions. Pt take 1 1/2 tablet (75 mcg) on Monday and Thursday and 1 tablet (50 mcg)  the rest of the week   magnesium oxide (MAG-OX) 400 MG tablet Take 400 mg by mouth at bedtime.   metoprolol succinate (TOPROL-XL) 25 MG 24 hr tablet TAKE 1 TABLET BY MOUTH EVERY DAY   multivitamin-lutein (OCUVITE-LUTEIN) CAPS capsule Take 1 capsule by mouth daily.   NON FORMULARY Take 2 tablets by mouth daily. Liposomal Glutathione  take   OVER THE COUNTER MEDICATION Take 1 capsule by mouth daily. MetaCalm   polycarbophil (FIBERCON) 625 MG tablet Take 1,250 mg by mouth daily.   potassium chloride (KLOR-CON M) 10 MEQ tablet Take 2 tablets (20 mEq total) by mouth every morning AND 1 tablet (10 mEq total) every evening.   Probiotic Product (PROBIOTIC-10 PO) Take 1 tablet by mouth daily. Lafonda Mosses Brand   Propylene Glycol (SYSTANE BALANCE) 0.6 % SOLN Place 1 drop into both eyes daily as needed (dry eyes).   raloxifene (EVISTA) 60 MG tablet Take 60 mg by mouth daily.   rasagiline (AZILECT) 1 MG  TABS tablet Take 1 tablet (1 mg total) by mouth daily.   rosuvastatin (CRESTOR) 5 MG tablet Take 2.5 mg by mouth every other day.   valACYclovir (VALTREX) 1000 MG tablet Take 1,000 mg by mouth 2 (two) times daily as needed (fever blisters).   vitamin B-12 (CYANOCOBALAMIN) 100 MCG tablet Take 100 mcg by mouth daily.   vitamin C (ASCORBIC ACID) 500 MG tablet Take 500 mg by mouth daily.   No facility-administered encounter medications on file as of 10/15/2021.    Objective:   PHYSICAL EXAMINATION:    VITALS:   There were no vitals filed for this visit.     GEN:  The patient appears stated age and is in NAD. HEENT:  Normocephalic, atraumatic.  The mucous membranes are moist. The superficial temporal arteries are without ropiness or tenderness. CV:  RRR Lungs:  CTAB Neck/HEME:  There are no carotid bruits bilaterally.  Neurological examination:  Orientation: The patient is alert and oriented x3. Cranial nerves: There is good facial symmetry with facial hypomimia. The speech is fluent and clear. Soft palate rises symmetrically and there is no tongue deviation. Hearing is intact to conversational tone. Sensation: Sensation is intact to light touch throughout Motor: Strength is at least antigravity x4.  Movement examination: Tone: There is nl tone in the ue/le Abnormal movements: there no dyskinesia today but there is RLE rest tremor with distraction (just took med at about 2pm) Coordination:  There is mod decremation on the R Gait and Station: The patient has no difficulty arising out of a deep-seated chair without the use of the hands. The patient's stride length is good.  The patient has a neg pull test.       Total time spent on today's visit was ***  minutes, including both face-to-face time and nonface-to-face time.  Time included that spent on review of records (prior notes available to me/labs/imaging if pertinent), discussing treatment and goals, answering patient's questions  and coordinating care.  Cc:  Crist Infante, MD

## 2021-10-15 ENCOUNTER — Encounter: Payer: Self-pay | Admitting: Neurology

## 2021-10-15 ENCOUNTER — Ambulatory Visit (INDEPENDENT_AMBULATORY_CARE_PROVIDER_SITE_OTHER): Payer: Medicare Other | Admitting: Neurology

## 2021-10-15 VITALS — BP 114/60 | HR 70 | Ht 61.0 in | Wt 131.6 lb

## 2021-10-15 DIAGNOSIS — G249 Dystonia, unspecified: Secondary | ICD-10-CM | POA: Diagnosis not present

## 2021-10-15 DIAGNOSIS — G20 Parkinson's disease: Secondary | ICD-10-CM

## 2021-10-15 MED ORDER — CARBIDOPA-LEVODOPA ER 50-200 MG PO TBCR: EXTENDED_RELEASE_TABLET | ORAL | 1 refills | Status: DC

## 2021-10-15 NOTE — Patient Instructions (Signed)
Local and Online Resources for Power over Parkinson's Group September 2023  LOCAL [place] PARKINSON'S GROUPS  Power over Parkinson's Group:   Power Over Parkinson's Patient Education Group will be Wednesday, September 13th-*Hybrid meting*- in person at [facility] location and via [facility] at 2 pm.   Upcoming Power over [employer] Meetings:  2nd Wednesdays of the month at 2 pm:  September 13th, October 11th, November 8th Contact Amy Marriott at [email or web address] if interested in participating in this group Parkinson's Care Partners Group:    3rd Mondays, Contact Misty Paladino Atypical Parkinsonian Patient Group:   4th Wednesdays, Contact Misty Paladino If you are interested in participating in these groups with Misty, please contact her directly for how to join those meetings.  Her contact information is [email or web address].    LOCAL EVENTS AND NEW OFFERINGS New PWR! Moves [employer]negy Instructor-Led Classes offering at [employer]!  Wednesdays 1-2 pm.   Contact Vonna Kotyk at  [email or web address] or Caron Presume at [place], [email or web address] Dance for Parkinson 's classes will be on Tuesdays 9:30am-10:30am starting October 3-December 12 with a break the week of November 21 . Located in the [employer]  which is in the first floor of the [employer] ([place] for Parkinson's will be held on 2nd and 4th Mondays at 11:00am . First class will start  September 25th.  Located at the [place] ([place] Through support from the [place] and Drumming for Parkinson's classes are free for both patients and caregivers.  Contact Misty Taylor-Paladino for more details about registering.  [place]:  www.[email or web address] PD Health at Home continues:  Mindfulness Mondays, Wellness  Wednesdays, Fitness Fridays  Upcoming Education:   Navigating Nutrition with PD.  Wednesday, Sept. 6th 1:00-2:00 pm Understanding Mind and Memory.  Wednesday, Sept. 20th 1:00-2:00 pm  Expert Briefing:    Parkinson's Disease and the Bladder.  Wednesday, Sept. 13th 1:00-2:00 pm Parkinson's and the Gut-Brain Connection.  Wednesday, Oct. 11th 1:00-2:00 pm Register for expert briefings (webinars) at [email or web address] Please check out their website to sign up for emails and see their full online offerings   [place]:  [email or web address]  Third Thursday Webinars:  On the third Thursday of every month at 12 p.m. ET, join our free live webinars to learn about various aspects of living with Parkinson's disease and our work to speed medical breakthroughs. Upcoming Webinar:  Stay tuned Check out additional information on their website to see their full online offerings  [employer]:  [email or web address] Upcoming Webinar:   Stay tuned Webinar Series:  Living with Parkinson's Meetup.   Third Thursdays each month, 3 pm Care Partner Monthly Meetup.  With Robin Searing Phinney.  First Tuesday of each month, 2 pm Check out additional information to Live Well Today on their website  Parkinson and Movement Disorders (PMD) Alliance:  www.pmdalliance.org [employer] Online:  Online Education Events Sign up for emails, which are sent weekly to give you updates on programming and online offerings  Parkinson's Association of the [place]:  www.parkinsonassociation.org Information on online support groups, education events, and online exercises including Yoga, Parkinson's exercises and more-LOTS of information on links to PD resources and online events Virtual Support Group through Parkinson's Association of the [place]; next one is scheduled for Wednesday, October 4th at 2 pm. (No September meeting  due to the symposium.  These are typically scheduled for the 1st Wednesday  of the month at 2 pm).  Visit website for details. Register for "Caring for Parkinson's-Caring for You", 9th Annual Symposium.  In-person event in [place].  September 9th.  To register:  www.[email or web address].org/symposium-registration/?blm_aid=[email or web address] MOVEMENT AND EXERCISE OPPORTUNITIES PWR! Moves Classes at [place].  Wednesdays 10 and 11 am.   Contact Amy Gerrit Friends, PT [email or web address] if interested. NEW PWR! Moves Class offerings at [employer].  Wednesdays 1-2 pm.  Contact Vonna Kotyk at  [email or web address] or Caron Presume at [place],  [email or web address] Parkinson's Wellness Recovery (PWR! Moves)  www.[email or web address] Info on the PWR! Virtual Experience:  You will have access to our expertise through self-assessment, guided plans that start with the PD-specific fundamentals, educational content, tips, Q&A with an expert, and a growing Art therapist of PD-specific pre-recorded and live exercise classes of varying types and intensity - both physical and cognitive! If that is not enough, we offer 1:1 wellness consultations (in-person or virtual) to personalize your PWR! Research scientist (medical).  [place] Fridays:  As part of the PD Health @ Home program, this free video series focuses each week on one aspect of fitness designed to support people living with Parkinson's.  These weekly videos highlight the [place] recent fitness guidelines for people with Parkinson's disease. ModemGamers.si Dance for PD website is offering free, live-stream classes throughout the week, as well as links to [employer] of classes:  [email or web address].org/ Virtual dance and Pilates for Parkinson's classes: Click on the Community Tab> Parkinson's Movement Initiative Tab.  To register for classes and for more  information, visit www.[email or web address] and click the "community" tab.  [employer] Parkinson's Cycling Classes  [employer]:  Thursdays @ Noon-Live classes at [employer] ([employer] at [email or web address] or ([phone]) [employer]: Virtual Classes Mondays and Thursdays Jeanette Caprice classes Tuesday, Wednesday and Thursday (contact [place] at [email or web address]  or [phone]) [place] Varied levels of classes are offered Tuesdays and Thursdays at [facility].  Stretching with Verdis Frederickson weekly class is also offered for people with Parkinson's To observe a class or for more information, call [phone] or email Hezzie Bump at [email or web address] ADDITIONAL SUPPORT AND RESOURCES Well[employer]Spring [employer]:Online Caregiver Education Opportunities:  www.well-springsolutions.org/caregiver-education/caregiver-support-group.  You may also contact Vickki Muff at [email or web address] or [phone].    Coping with Difficult Caregiver Emotions.  Wednesday, September 20th, 10:30 am-12.  The [facility], [facility] Collective Navigating the Maze of Senior Care Options.  Thursday, September 28th, 4-5:15 pm.  The [facility]. Well-Spring Navigator:  [facility] program, a free service to help individuals and families through the journey of determining care for older adults.  The "Navigator" is a [employer], Education officer, museum, who will speak with a prospective client and/or loved ones to provide an assessment of the situation and a set of recommendations for a personalized care plan -- all free of charge, and whether Well-Spring [employer] offers the needed service or not. If the need is not a service we provide, we are well-connected with reputable programs in town that we can refer you to.  www.well-springsolutions.org or to speak with the Navigator, call [phone].

## 2021-10-16 LAB — CUP PACEART REMOTE DEVICE CHECK
Battery Remaining Longevity: 135 mo
Battery Voltage: 3.2 V
Brady Statistic AP VP Percent: 71.85 %
Brady Statistic AP VS Percent: 2.58 %
Brady Statistic AS VP Percent: 23.46 %
Brady Statistic AS VS Percent: 2.11 %
Brady Statistic RA Percent Paced: 73.09 %
Brady Statistic RV Percent Paced: 95.26 %
Date Time Interrogation Session: 20230913225342
Implantable Lead Implant Date: 20140917
Implantable Lead Implant Date: 20140917
Implantable Lead Location: 753859
Implantable Lead Location: 753860
Implantable Pulse Generator Implant Date: 20230614
Lead Channel Impedance Value: 304 Ohm
Lead Channel Impedance Value: 323 Ohm
Lead Channel Impedance Value: 361 Ohm
Lead Channel Impedance Value: 456 Ohm
Lead Channel Pacing Threshold Amplitude: 0.75 V
Lead Channel Pacing Threshold Amplitude: 0.75 V
Lead Channel Pacing Threshold Pulse Width: 0.4 ms
Lead Channel Pacing Threshold Pulse Width: 0.4 ms
Lead Channel Sensing Intrinsic Amplitude: 0.375 mV
Lead Channel Sensing Intrinsic Amplitude: 0.375 mV
Lead Channel Sensing Intrinsic Amplitude: 15.125 mV
Lead Channel Sensing Intrinsic Amplitude: 15.125 mV
Lead Channel Setting Pacing Amplitude: 1.75 V
Lead Channel Setting Pacing Amplitude: 2 V
Lead Channel Setting Pacing Pulse Width: 0.4 ms
Lead Channel Setting Sensing Sensitivity: 1.2 mV

## 2021-10-17 ENCOUNTER — Ambulatory Visit (INDEPENDENT_AMBULATORY_CARE_PROVIDER_SITE_OTHER): Payer: Medicare Other

## 2021-10-17 DIAGNOSIS — I495 Sick sinus syndrome: Secondary | ICD-10-CM

## 2021-10-18 DIAGNOSIS — H524 Presbyopia: Secondary | ICD-10-CM | POA: Diagnosis not present

## 2021-10-18 DIAGNOSIS — H26491 Other secondary cataract, right eye: Secondary | ICD-10-CM | POA: Diagnosis not present

## 2021-10-18 DIAGNOSIS — H5213 Myopia, bilateral: Secondary | ICD-10-CM | POA: Diagnosis not present

## 2021-10-18 DIAGNOSIS — H353212 Exudative age-related macular degeneration, right eye, with inactive choroidal neovascularization: Secondary | ICD-10-CM | POA: Diagnosis not present

## 2021-10-18 DIAGNOSIS — Z961 Presence of intraocular lens: Secondary | ICD-10-CM | POA: Diagnosis not present

## 2021-10-21 ENCOUNTER — Encounter: Payer: Self-pay | Admitting: Cardiology

## 2021-10-21 ENCOUNTER — Ambulatory Visit: Payer: Medicare Other | Attending: Cardiology | Admitting: Cardiology

## 2021-10-21 VITALS — BP 116/80 | HR 76 | Ht 61.0 in | Wt 130.6 lb

## 2021-10-21 DIAGNOSIS — D6869 Other thrombophilia: Secondary | ICD-10-CM | POA: Diagnosis not present

## 2021-10-21 DIAGNOSIS — I495 Sick sinus syndrome: Secondary | ICD-10-CM | POA: Diagnosis not present

## 2021-10-21 DIAGNOSIS — I4819 Other persistent atrial fibrillation: Secondary | ICD-10-CM

## 2021-10-21 LAB — CUP PACEART INCLINIC DEVICE CHECK
Battery Remaining Longevity: 132 mo
Brady Statistic RA Percent Paced: 63.4 %
Brady Statistic RV Percent Paced: 95.1 %
Date Time Interrogation Session: 20230918200359
Implantable Lead Implant Date: 20140917
Implantable Lead Implant Date: 20140917
Implantable Lead Location: 753859
Implantable Lead Location: 753860
Implantable Pulse Generator Implant Date: 20230614
Lead Channel Pacing Threshold Amplitude: 0.5 V
Lead Channel Pacing Threshold Amplitude: 0.75 V
Lead Channel Pacing Threshold Pulse Width: 0.4 ms
Lead Channel Pacing Threshold Pulse Width: 0.4 ms
Lead Channel Sensing Intrinsic Amplitude: 0.5 mV
Lead Channel Sensing Intrinsic Amplitude: 15 mV

## 2021-10-21 NOTE — Progress Notes (Signed)
Electrophysiology Office Note   Date:  10/21/2021   ID:  [name], [name] Jan 27, 1941, MRN [id number]  PCP:  Crist Infante, MD  Cardiologist:   Primary Electrophysiologist:  Adi Seales Meredith Leeds, MD    Chief Complaint: pacemaker   History of Present Illness: [name] [name] is a 81 y.o. female who is being seen today for the evaluation of pacemaker at the request of Crist Infante, MD. Presenting today for electrophysiology evaluation.  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  He is status post Medtronic generator change 07/17/2021.  Today, she denies symptoms of palpitations, chest pain, shortness of breath, orthopnea, PND, lower extremity edema, claudication, dizziness, presyncope, syncope, bleeding, or neurologic sequela. The patient is tolerating medications without difficulties.  Symptom is fatigue.  She has fatigue multiple times in the day.  She states that she feels that it may be due to her atrial fibrillation.  She has a 13% burden on device interrogation.  She can walk at times for 30 minutes, but other feel like she can walk the room.   Past Medical History:  Diagnosis Date   Anemia    Arthritis    thumbs   Atrial tachycardia (HCC)    Atypical atrial flutter (HCC)    Cataract    bilateral   Chronic anticoagulation    on Xarelto   Chronic diastolic CHF (congestive heart failure) (HCC)    Echocardiogram (11/24/12): Mild LVH, EF 60%.   Dysrhythmia    GERD (gastroesophageal reflux disease)    hx of no meds   Heart murmur    slight   Hyperlipidemia    a. Hx of leg weakness while on statin. under control   Hypertension    Hypothyroidism    Leg fracture Dec 21,2011   Osteoporosis 07/2007   Parkinson's disease  ([place]) 2011   Persistent atrial fibrillation ([place])    s/p atrial fib ablation 11-11-11.    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[place])    Squamous carcinoma summer 2015   back of left thigh   Wrist fracture    right   Past Surgical History:  Procedure Laterality Date   ATRIAL FIBRILLATION ABLATION N/A 11/11/2011   Procedure: ATRIAL FIBRILLATION ABLATION;  Surgeon: Thompson Grayer, MD;  Location[facility] CATH LAB;  Service: Cardiovascular;  Laterality: N/A;   BREAST BIOPSY  1998   BREAST SURGERY     benign cyst removed   CARDIAC ELECTROPHYSIOLOGY STUDY AND ABLATION  5/14, 7/14   convergent AF ablation at [facility] and subsequent atypical atrial flutter ablation by Dr Lehman Prom   CARDIOVASCULAR STRESS TEST  06-06-2008   EF 73%   CARDIOVERSION  09/09/2011   Procedure: CARDIOVERSION;  Surgeon: Darlin Coco, MD;  Location: [facility] ENDOSCOPY;  Service: Cardiovascular;  Laterality: N/A;  to be done by dr. Mare Ferrari   CARDIOVERSION  02/18/2012   Procedure: CARDIOVERSION;  Surgeon: Thayer Headings, MD;  Location: [facility] ENDOSCOPY;  Service: Cardiovascular;  Laterality: N/A;   [place]  2011   metal pin in place   FOREARM SURGERY Left 2010   "opened arm to drain it"   INSERTION  OF MESH N/A 11/24/2013   Procedure: INSERTION OF MESH;  Surgeon: Michael Boston, MD;  Location: [facility] ORS;  Service: General;  Laterality: N/A;   KNEE SURGERY Left 2001   Left unicompartmental knee     Dr. Alvan Dame 11/18/16   PACEMAKER PLACEMENT  10/20/12   MDT Advisa DR implanted by Dr Lehman Prom at [place] Left 11/18/2016   Procedure: LEFT UNICOMPARTMENTAL KNEE Medially;  Surgeon: Paralee Cancel, MD;  Location: [facility] ORS;  Service: Orthopedics;  Laterality: Left;  90 mins   PPM GENERATOR CHANGEOUT N/A 07/17/2021   Procedure: PPM  GENERATOR CHANGEOUT;  Surgeon: Constance Haw, MD;  Location: [place] CV LAB;  Service: Cardiovascular;  Laterality: N/A;   RIGHT HEART CATH N/A 06/13/2021   Procedure: RIGHT HEART CATH;  Surgeon: Larey Dresser, MD;  Location: [place] CV LAB;  Service: Cardiovascular;  Laterality: N/A;   SKIN CANCER DESTRUCTION  summer 2015   TEE WITHOUT CARDIOVERSION  11/10/2011   Procedure: TRANSESOPHAGEAL ECHOCARDIOGRAM (TEE);  Surgeon: Thayer Headings, MD;  Location: [place];  Service: Cardiovascular;  Laterality: N/A;   TONSILLECTOMY  as child   US ECHOCARDIOGRAPHY  03-10-2008   Est EF 55-60%, Dr. Cathie Olden   VENTRAL HERNIA REPAIR N/A 11/24/2013   Procedure: LAPAROSCOPIC VENTRAL HERNIA;  Surgeon: Michael Boston, MD;  Location: [facility] ORS;  Service: General;  Laterality: N/A;   WRIST SURGERY Right ~2009   metal rod in place     Current Outpatient Medications  Medication Sig Dispense Refill   Acetylcysteine (NAC 600) 600 MG CAPS Take 600 mg by mouth daily.     apixaban (ELIQUIS) 5 MG TABS tablet TAKE 1 TABLET BY MOUTH TWICE A DAY 60 tablet 6   BELSOMRA 20 MG TABS Take 1 tablet by mouth at bedtime as needed (sleep).     CALCIUM PO Take 1 tablet by mouth at bedtime.     carbidopa-levodopa (SINEMET CR) 50-200 MG tablet TAKE 1 TABLET BY MOUTH EVERYDAY AT BEDTIME 90 tablet 1   carbidopa-levodopa (SINEMET IR) 25-100 MG tablet TAKE 2 TABLETS BY MOUTH 3 TIMES DAILY. 540 tablet 1   cholecalciferol (VITAMIN D3) 25 MCG (1000 UNIT) tablet Take 1,000 Units by mouth daily.     cyanocobalamin (,VITAMIN B-12,) 1000 MCG/ML injection INJECT 1ML INTRAMUSCULARLY IN THE DELTOID ONCE A MONTH (ALTERNATING DELT. EACH MONTH)     docusate sodium (COLACE) 100 MG capsule Take 100 mg by mouth in the morning and at bedtime.     ezetimibe (ZETIA) 10 MG tablet Take 10 mg by mouth every other day. Taking the same day as the crestor     furosemide (LASIX) 80 MG tablet Take 80 mg by mouth daily.     levothyroxine (SYNTHROID)  50 MCG tablet Take 50-75 mcg by mouth See admin instructions. Pt take 1 1/2 tablet (75 mcg) on Monday and Thursday and 1 tablet (50 mcg)  the rest of the week     magnesium oxide (MAG-OX) 400 MG tablet Take 400 mg by mouth at bedtime.     metoprolol succinate (TOPROL-XL) 25 MG 24 hr tablet TAKE 1 TABLET BY MOUTH EVERY DAY 90 tablet 3   multivitamin-lutein (OCUVITE-LUTEIN) CAPS capsule Take 1 capsule by mouth daily.     NON FORMULARY Take 2 tablets by mouth daily. Liposomal Glutathione take     OVER THE COUNTER MEDICATION Take 1 capsule by mouth daily. MetaCalm     polycarbophil (FIBERCON) 625 MG tablet Take 1,250 mg  by mouth daily.     potassium chloride (KLOR-CON M) 10 MEQ tablet Take 2 tablets (20 mEq total) by mouth every morning AND 1 tablet (10 mEq total) every evening. 90 tablet 6   Probiotic Product (PROBIOTIC-10 PO) Take 1 tablet by mouth daily. Lafonda Mosses Brand     Propylene Glycol (SYSTANE BALANCE) 0.6 % SOLN Place 1 drop into both eyes daily as needed (dry eyes).     raloxifene (EVISTA) 60 MG tablet Take 60 mg by mouth daily.     rasagiline (AZILECT) 1 MG TABS tablet Take 1 tablet (1 mg total) by mouth daily. 90 tablet 1   rosuvastatin (CRESTOR) 5 MG tablet Take 2.5 mg by mouth every other day.     valACYclovir (VALTREX) 1000 MG tablet Take 1,000 mg by mouth 2 (two) times daily as needed (fever blisters).     vitamin B-12 (CYANOCOBALAMIN) 100 MCG tablet Take 100 mcg by mouth daily.     vitamin C (ASCORBIC ACID) 500 MG tablet Take 500 mg by mouth daily.     No current facility-administered medications for this visit.    Allergies:   Amiodarone and Statins   Social History:  The patient  reports that she has never smoked. She has never used smokeless tobacco. She reports that she does not drink alcohol and does not use drugs.   Family History:  The patient's family history includes Alzheimer's disease in her mother; Breast cancer in her maternal aunt; Healthy in her child and child; Heart  failure in her father.    ROS:  Please see the history of present illness.   Otherwise, review of systems is positive for none.   All other systems are reviewed and negative.    PHYSICAL EXAM: VS:  BP 116/80   Pulse 76   Ht '5\' 1"'$  (1.549 m)   Wt 130 lb 9.6 oz (59.2 kg)   LMP 02/04/1996   SpO2 96%   BMI 24.68 kg/m  , BMI Body mass index is 24.68 kg/m. GEN: Well nourished, well developed, in no acute distress  HEENT: normal  Neck: no JVD, carotid bruits, or masses Cardiac: RRR; no murmurs, rubs, or gallops,no edema  Respiratory:  clear to auscultation bilaterally, normal work of breathing GI: soft, nontender, nondistended, + BS MS: no deformity or atrophy  Skin: warm and dry, device pocket is well healed Neuro:  Strength and sensation are intact Psych: euthymic mood, full affect  EKG:  EKG is ordered today. Personal review of the ekg ordered shows a sensed, V paced  Device interrogation is reviewed today in detail.  See PaceArt for details.   Recent Labs: 04/22/2021: ALT 6 07/12/2021: B Natriuretic Peptide 109.8 07/17/2021: Hemoglobin 9.7; Platelets 181 07/22/2021: BUN 13; Creatinine, Ser 0.80; Potassium 4.1; Sodium 137    Lipid Panel     Component Value Date/Time   CHOL  05/21/2007 0540    171        ATP III CLASSIFICATION:  <200     mg/dL   Desirable  200-239  mg/dL   Borderline High  >=240    mg/dL   High   TRIG 51 05/21/2007 0540   HDL 59 05/21/2007 0540   CHOLHDL 2.9 05/21/2007 0540   VLDL 10 05/21/2007 0540   LDLCALC (H) 05/21/2007 0540    102        Total Cholesterol/HDL:CHD Risk Coronary Heart Disease Risk Table  Men   Women  1/2 Average Risk   3.4   3.3     Wt Readings from Last 3 Encounters:  10/21/21 130 lb 9.6 oz (59.2 kg)  10/15/21 131 lb 9.6 oz (59.7 kg)  09/11/21 128 lb 12.8 oz (58.4 kg)      Other studies Reviewed: Additional studies/ records that were reviewed today include: TTE 04/29/21  Review of the above records  today demonstrates:   1. Left ventricular ejection fraction, by estimation, is 65 to 70%. The  left ventricle has normal function. The left ventricle has no regional  wall motion abnormalities. There is mild concentric left ventricular  hypertrophy. Left ventricular diastolic  parameters are indeterminate.   2. Right ventricular systolic function is normal. The right ventricular  size is normal. There is severely elevated pulmonary artery systolic  pressure. The estimated right ventricular systolic pressure is 54.5 mmHg.   3. Left atrial size was moderately dilated.   4. Right atrial size was moderately dilated.   5. The mitral valve is grossly normal. Mild mitral valve regurgitation.   6. Tricuspid valve regurgitation is moderate.   7. The aortic valve is tricuspid. There is mild calcification of the  aortic valve. There is mild thickening of the aortic valve. Aortic valve  regurgitation is not visualized. Aortic valve sclerosis/calcification is  present, without any evidence of  aortic stenosis.    ASSESSMENT AND PLAN:  1.  Tachybradycardia syndrome: Status post Medtronic dual-chamber pacemaker with generator change 07/17/2021.  Device functioning appropriately.  No changes at this time.  2.  Longstanding persistent atrial fibrillation: CHA2DS2-VASc of 5.  Currently on Eliquis 5 mg twice daily.  Has had both endocardial and epicardial ablation.  I have offered her antiarrhythmics, though I think that amiodarone would likely be her best option.  She has tried Tikosyn in the past and has not tolerated it with more atrial fibrillation.  She has also had issues with amiodarone.  We Jirah Rider hold off on further therapy.  3.  Chronic diastolic heart failure: No obvious volume overload.  Plan per primary cardiology.  4.  Secondary hypercoagulable state: Currently on Eliquis for atrial fibrillation as above  5.  Hypertension: Currently well controlled   Current medicines are reviewed at length  with the patient today.   The patient does not have concerns regarding her medicines.  The following changes were made today:  none  Labs/ tests ordered today include:  Orders Placed This Encounter  Procedures   EKG 12-Lead     Disposition:   FU 6 months  Signed, Tekeshia Klahr Meredith Leeds, MD  10/21/2021 3:20 PM     [place] [address] [place] [address] [phone] (office) [phone] (fax)

## 2021-10-21 NOTE — Patient Instructions (Signed)
Medication Instructions:  Your physician recommends that you continue on your current medications as directed. Please refer to the Current Medication list given to you today.  *If you need a refill on your cardiac medications before your next appointment, please call your pharmacy*   Lab Work: None ordered If you have labs (blood work) drawn today and your tests are completely normal, you will receive your results only by: [place] (if you have MyChart) OR A paper copy in the mail If you have any lab test that is abnormal or we need to change your treatment, we will call you to review the results.   Testing/Procedures: None ordered   Follow-Up: At [facility], you and your health needs are our priority.  As part of our continuing mission to provide you with exceptional heart care, we have created designated Provider Care Teams.  These Care Teams include your primary Cardiologist (physician) and Advanced Practice Providers (APPs -  Physician Assistants and Nurse Practitioners) who all work together to provide you with the care you need, when you need it.  Remote monitoring is used to monitor your Pacemaker or ICD from home. This monitoring reduces the number of office visits required to check your device to one time per year. It allows [place] to keep an eye on the functioning of your device to ensure it is working properly. You are scheduled for a device check from home on 01/16/2022. You may send your transmission at any time that day. If you have a wireless device, the transmission will be sent automatically. After your physician reviews your transmission, you will receive a postcard with your next transmission date.  Your next appointment:   6 month(s)  The format for your next appointment:   In Person  Provider:   You will see one of the following Advanced Practice Providers on your designated Care Team:   Tommye Standard, [place] Legrand Como "Jonni Sanger" Chalmers Cater, [place]    Thank you for  choosing [name]!!   Trinidad Curet, RN [phone]    Other Instructions   Important Information About Sugar

## 2021-10-30 NOTE — Progress Notes (Signed)
Remote pacemaker transmission.   

## 2021-11-05 ENCOUNTER — Telehealth: Payer: Self-pay | Admitting: Cardiology

## 2021-11-05 DIAGNOSIS — I4811 Longstanding persistent atrial fibrillation: Secondary | ICD-10-CM

## 2021-11-05 MED ORDER — APIXABAN 5 MG PO TABS: 5.0000 mg | ORAL_TABLET | Freq: Two times a day (BID) | ORAL | 1 refills | Status: DC

## 2021-11-05 NOTE — Telephone Encounter (Signed)
Prescription refill request for Eliquis received. Indication:Afib Last office visit:9/23 Scr:0.8 Age: 81 Weight:59.2 kg  Prescription refilled

## 2021-11-05 NOTE — Telephone Encounter (Signed)
*  STAT* If patient is at the pharmacy, call can be transferred to refill team.   1. Which medications need to be refilled? (please list name of each medication and dose if known)   apixaban (ELIQUIS) 5 MG TABS tablet  2. Which pharmacy/location (including street and city if local pharmacy) is medication to be sent to?  [facility] - [place], [place] - [place] [address]  3. Do they need a 30 day or 90 day supply?  90 day  Caller stated patient is transitioning her medications to [facility].

## 2021-11-20 DIAGNOSIS — M79642 Pain in left hand: Secondary | ICD-10-CM | POA: Diagnosis not present

## 2021-11-20 DIAGNOSIS — M13849 Other specified arthritis, unspecified hand: Secondary | ICD-10-CM | POA: Diagnosis not present

## 2021-11-20 DIAGNOSIS — M65849 Other synovitis and tenosynovitis, unspecified hand: Secondary | ICD-10-CM | POA: Diagnosis not present

## 2021-11-28 ENCOUNTER — Other Ambulatory Visit (HOSPITAL_COMMUNITY): Payer: Self-pay | Admitting: *Deleted

## 2021-11-28 MED ORDER — METOPROLOL SUCCINATE ER 25 MG PO TB24: ORAL_TABLET | ORAL | 3 refills | Status: DC

## 2021-11-28 MED ORDER — FUROSEMIDE 80 MG PO TABS: 80.0000 mg | ORAL_TABLET | Freq: Every day | ORAL | 3 refills | Status: DC

## 2021-12-02 DIAGNOSIS — I509 Heart failure, unspecified: Secondary | ICD-10-CM | POA: Diagnosis not present

## 2021-12-02 DIAGNOSIS — D6869 Other thrombophilia: Secondary | ICD-10-CM | POA: Diagnosis not present

## 2021-12-02 DIAGNOSIS — G20A1 Parkinson's disease without dyskinesia, without mention of fluctuations: Secondary | ICD-10-CM | POA: Diagnosis not present

## 2021-12-02 DIAGNOSIS — I5032 Chronic diastolic (congestive) heart failure: Secondary | ICD-10-CM | POA: Diagnosis not present

## 2021-12-02 DIAGNOSIS — I10 Essential (primary) hypertension: Secondary | ICD-10-CM | POA: Diagnosis not present

## 2021-12-02 DIAGNOSIS — M810 Age-related osteoporosis without current pathological fracture: Secondary | ICD-10-CM | POA: Diagnosis not present

## 2021-12-02 DIAGNOSIS — E039 Hypothyroidism, unspecified: Secondary | ICD-10-CM | POA: Diagnosis not present

## 2021-12-02 DIAGNOSIS — D649 Anemia, unspecified: Secondary | ICD-10-CM | POA: Diagnosis not present

## 2021-12-02 DIAGNOSIS — R7301 Impaired fasting glucose: Secondary | ICD-10-CM | POA: Diagnosis not present

## 2021-12-02 DIAGNOSIS — I480 Paroxysmal atrial fibrillation: Secondary | ICD-10-CM | POA: Diagnosis not present

## 2021-12-02 DIAGNOSIS — K7460 Unspecified cirrhosis of liver: Secondary | ICD-10-CM | POA: Diagnosis not present

## 2021-12-02 DIAGNOSIS — E538 Deficiency of other specified B group vitamins: Secondary | ICD-10-CM | POA: Diagnosis not present

## 2021-12-03 ENCOUNTER — Encounter (INDEPENDENT_AMBULATORY_CARE_PROVIDER_SITE_OTHER): Payer: Medicare Other | Admitting: Ophthalmology

## 2021-12-03 ENCOUNTER — Encounter (INDEPENDENT_AMBULATORY_CARE_PROVIDER_SITE_OTHER): Payer: Self-pay

## 2021-12-03 DIAGNOSIS — H353132 Nonexudative age-related macular degeneration, bilateral, intermediate dry stage: Secondary | ICD-10-CM | POA: Diagnosis not present

## 2021-12-03 DIAGNOSIS — Z23 Encounter for immunization: Secondary | ICD-10-CM | POA: Diagnosis not present

## 2021-12-03 DIAGNOSIS — H35721 Serous detachment of retinal pigment epithelium, right eye: Secondary | ICD-10-CM | POA: Diagnosis not present

## 2021-12-03 DIAGNOSIS — H353211 Exudative age-related macular degeneration, right eye, with active choroidal neovascularization: Secondary | ICD-10-CM | POA: Diagnosis not present

## 2021-12-03 DIAGNOSIS — H43812 Vitreous degeneration, left eye: Secondary | ICD-10-CM | POA: Diagnosis not present

## 2021-12-09 ENCOUNTER — Other Ambulatory Visit: Payer: Self-pay | Admitting: Neurology

## 2021-12-09 DIAGNOSIS — G20A1 Parkinson's disease without dyskinesia, without mention of fluctuations: Secondary | ICD-10-CM

## 2021-12-09 DIAGNOSIS — G20B1 Parkinson's disease with dyskinesia, without mention of fluctuations: Secondary | ICD-10-CM

## 2021-12-13 ENCOUNTER — Encounter (HOSPITAL_COMMUNITY): Payer: Self-pay | Admitting: Cardiology

## 2021-12-13 ENCOUNTER — Ambulatory Visit (HOSPITAL_COMMUNITY)
Admission: RE | Admit: 2021-12-13 | Discharge: 2021-12-13 | Disposition: A | Discharge status: Home / Self Care | Payer: Medicare Other | Source: Ambulatory Visit | Attending: Cardiology | Admitting: Cardiology

## 2021-12-13 VITALS — BP 102/60 | HR 85 | Wt 129.6 lb

## 2021-12-13 DIAGNOSIS — I272 Pulmonary hypertension, unspecified: Secondary | ICD-10-CM | POA: Insufficient documentation

## 2021-12-13 DIAGNOSIS — N390 Urinary tract infection, site not specified: Secondary | ICD-10-CM | POA: Diagnosis not present

## 2021-12-13 DIAGNOSIS — K760 Fatty (change of) liver, not elsewhere classified: Secondary | ICD-10-CM | POA: Insufficient documentation

## 2021-12-13 DIAGNOSIS — I110 Hypertensive heart disease with heart failure: Secondary | ICD-10-CM | POA: Diagnosis not present

## 2021-12-13 DIAGNOSIS — G20A1 Parkinson's disease without dyskinesia, without mention of fluctuations: Secondary | ICD-10-CM | POA: Diagnosis not present

## 2021-12-13 DIAGNOSIS — I495 Sick sinus syndrome: Secondary | ICD-10-CM | POA: Insufficient documentation

## 2021-12-13 DIAGNOSIS — Z7901 Long term (current) use of anticoagulants: Secondary | ICD-10-CM | POA: Insufficient documentation

## 2021-12-13 DIAGNOSIS — I5032 Chronic diastolic (congestive) heart failure: Secondary | ICD-10-CM | POA: Diagnosis not present

## 2021-12-13 DIAGNOSIS — E039 Hypothyroidism, unspecified: Secondary | ICD-10-CM | POA: Diagnosis not present

## 2021-12-13 DIAGNOSIS — Z79899 Other long term (current) drug therapy: Secondary | ICD-10-CM | POA: Diagnosis not present

## 2021-12-13 DIAGNOSIS — E785 Hyperlipidemia, unspecified: Secondary | ICD-10-CM | POA: Insufficient documentation

## 2021-12-13 DIAGNOSIS — I480 Paroxysmal atrial fibrillation: Secondary | ICD-10-CM | POA: Diagnosis not present

## 2021-12-13 LAB — BASIC METABOLIC PANEL
Anion gap: 7 (ref 5–15)
BUN: 13 mg/dL (ref 8–23)
CO2: 31 mmol/L (ref 22–32)
Calcium: 9.6 mg/dL (ref 8.9–10.3)
Chloride: 103 mmol/L (ref 98–111)
Creatinine, Ser: 0.7 mg/dL (ref 0.44–1.00)
GFR, Estimated: >60 mL/min (ref >60)
Glucose, Bld: 82 mg/dL (ref 70–99)
Potassium: 3.5 mmol/L (ref 3.5–5.1)
Sodium: 141 mmol/L (ref 135–145)

## 2021-12-13 LAB — MAGNESIUM: Magnesium: 2 mg/dL (ref 1.7–2.4)

## 2021-12-13 NOTE — Patient Instructions (Signed)
There has been no changes to your medications.  Labs done today, your results will be available in MyChart, we will contact you for abnormal readings.  Your physician recommends that you schedule a follow-up appointment in: 6 months ( May 2024)  ** please call the office in February to arrange your follow up appointment **  If you have any questions or concerns before your next appointment please send us a message through mychart or call our office at [phone].    TO LEAVE A MESSAGE FOR THE NURSE SELECT OPTION 2, PLEASE LEAVE A MESSAGE INCLUDING: YOUR NAME DATE OF BIRTH CALL BACK NUMBER REASON FOR CALL**this is important as we prioritize the call backs  YOU WILL RECEIVE A CALL BACK THE SAME DAY AS LONG AS YOU CALL BEFORE 4:00 PM  At the [facility], you and your health needs are our priority. As part of our continuing mission to provide you with exceptional heart care, we have created designated Provider Care Teams. These Care Teams include your primary Cardiologist (physician) and Advanced Practice Providers (APPs- Physician Assistants and Nurse Practitioners) who all work together to provide you with the care you need, when you need it.   You may see any of the following providers on your designated Care Team at your next follow up: Dr Daniel Bensimhon Dr Dalton McLean Dr. Aditya Sabharwal Amy Clegg, NP Brittainy Simmons, PA Jessica Milford,NP Lindsay Finch, PA Alma Diaz, NP Lauren Kemp, PharmD   Please be sure to bring in all your medications bottles to every appointment.    

## 2021-12-15 NOTE — Progress Notes (Signed)
PCP: Crist Infante, MD EP: Dr. Curt Bears HF Cardiology: Dr. Aundra Dubin  81 y.o. 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  Patient had an echo done in 3/23 showing EF 65-70%, mild LVH, normal RV, PASP 60 mmHg, moderate biatrial enlargement, mild MR, moderate TR. Prior echoes had shown mildly elevated PA pressure. Cardiolite in 10/21 showed no ischemia.   RHC was done in 5/23 showing mild pulmonary venous hypertension with mildly elevated PCWP. She was started on Farxiga.  She developed a persistent UTI that was difficult to clear and stopped [place].   Today she returns for HF follow up. She has occasional atrial fibrillation runs by device interrogation but does not feel palpitations. She is in NSR today.  She is short of breath if she walks up 3 flights of stairs or a steep hill.  No dyspnea walking on flat ground.  No orthopnea/PND.  No chest pain.  No lightheadedness.  Weight is stable.   ECG (personally reviewed): A-V dual paced  Labs (3/23): hgb 9.7 Labs (4/23): K 3.2, creatinine 0.92 Labs (07/2021): creatinine 0.8   PMH: 1. Atrial fibrillation: Paroxysmal.  S/p AF (epicardial and endocardial) ablation in 10/13.  2. Tachy-brady syndrome: Medtronic PPM placed in 9/14.  3. H/o atypical atrial flutter 4. Hyperlipidemia 5. HTN 6. Hypothyroidism 7. Parkinsons disease: Followed by Dr. Carles Collet 8. Cirrhosis: ?NAFLD. No ETOH.  9. Chronic diastolic CHF/pulmonary hypertension:  - LHC (2014): No significant CAD.  - Cardiolite (10/21): EF 71%, no ischemia.  - Echo (3/23): EF 65-70%, mild LVH, normal RV, PASP 60 mmHg, moderate biatrial enlargement, mild MR, moderate TR.  - RHC (5/23): mean RA 8, PA 49/13 mean 30, mean PCWP 19 with v-waves to 30, CI 3.96, PVR 1.8 WU.  10. B12 deficiency.   Social History   Socioeconomic  History   Marital status: Married    Spouse name: Not on file   Number of children: 2   Years of education: Not on file   Highest education level: Master's degree (e.g., MA, MS, MEng, MEd, MSW, MBA)  Occupational History   Not on file  Tobacco Use   Smoking status: Never   Smokeless tobacco: Never  Vaping Use   Vaping Use: Never used  Substance and Sexual Activity   Alcohol use: No   Drug use: No   Sexual activity: Yes    Partners: Male    Birth control/protection: Post-menopausal  Other Topics Concern   Not on file  Social History Narrative   Lives in [place].  Retired Licensed conveyancer.   Right handed    Social Determinants of Health   Financial Resource Strain: Not on file  Food Insecurity: Not on file  Transportation Needs: Not on file  Physical Activity: Not on file  Stress: Not on file  Social Connections: Not on file  Intimate Partner Violence: Not on file   Family History  Problem Relation Age of Onset   Alzheimer's disease Mother    Heart failure Father    Healthy Child    Healthy Child    Breast cancer Maternal Aunt    ROS: All systems reviewed and negative except as per HPI.   Current Outpatient Medications  Medication Sig Dispense Refill   Acetylcysteine (NAC 600) 600 MG CAPS Take 600 mg by mouth daily.     apixaban (ELIQUIS) 5 MG TABS tablet Take 1 tablet (  5 mg total) by mouth 2 (two) times daily. 180 tablet 1   BELSOMRA 20 MG TABS Take 1 tablet by mouth at bedtime as needed (sleep).     CALCIUM PO Take 1 tablet by mouth at bedtime.     carbidopa-levodopa (SINEMET CR) 50-200 MG tablet TAKE 1 TABLET BY MOUTH EVERYDAY AT BEDTIME 90 tablet 1   carbidopa-levodopa (SINEMET IR) 25-100 MG tablet TAKE 2 TABLETS BY MOUTH 3 TIMES DAILY. 540 tablet 1   cholecalciferol (VITAMIN D3) 25 MCG (1000 UNIT) tablet Take 1,000 Units by mouth daily.     cyanocobalamin (,VITAMIN B-12,) 1000 MCG/ML injection INJECT 1ML INTRAMUSCULARLY IN THE DELTOID ONCE A MONTH (ALTERNATING  DELT. EACH MONTH)     docusate sodium (COLACE) 100 MG capsule Take 100 mg by mouth in the morning and at bedtime.     ezetimibe (ZETIA) 10 MG tablet Take 10 mg by mouth every other day. Taking the same day as the crestor     furosemide (LASIX) 80 MG tablet Take 1 tablet (80 mg total) by mouth daily. 90 tablet 3   levothyroxine (SYNTHROID) 50 MCG tablet Take 50-75 mcg by mouth See admin instructions. Pt take 1 1/2 tablet (75 mcg) on Monday and Thursday and 1 tablet (50 mcg)  the rest of the week     magnesium oxide (MAG-OX) 400 MG tablet Take 400 mg by mouth at bedtime.     metoprolol succinate (TOPROL-XL) 25 MG 24 hr tablet TAKE 1 TABLET BY MOUTH EVERY DAY 90 tablet 3   multivitamin-lutein (OCUVITE-LUTEIN) CAPS capsule Take 1 capsule by mouth daily.     NON FORMULARY Take 2 tablets by mouth daily. Liposomal Glutathione take     OVER THE COUNTER MEDICATION Take 1 capsule by mouth daily. MetaCalm     polycarbophil (FIBERCON) 625 MG tablet Take 1,250 mg by mouth daily.     potassium chloride (KLOR-CON M) 10 MEQ tablet Take 2 tablets (20 mEq total) by mouth every morning AND 1 tablet (10 mEq total) every evening. 90 tablet 6   Probiotic Product (PROBIOTIC-10 PO) Take 1 tablet by mouth daily. Lafonda Mosses Brand     Propylene Glycol (SYSTANE BALANCE) 0.6 % SOLN Place 1 drop into both eyes daily as needed (dry eyes).     raloxifene (EVISTA) 60 MG tablet Take 60 mg by mouth daily.     rasagiline (AZILECT) 1 MG TABS tablet TAKE 1 TABLET BY MOUTH EVERY DAY 90 tablet 0   rosuvastatin (CRESTOR) 5 MG tablet Take 2.5 mg by mouth every other day.     valACYclovir (VALTREX) 1000 MG tablet Take 1,000 mg by mouth 2 (two) times daily as needed (fever blisters).     vitamin B-12 (CYANOCOBALAMIN) 100 MCG tablet Take 100 mcg by mouth daily.     vitamin C (ASCORBIC ACID) 500 MG tablet Take 500 mg by mouth daily.     No current facility-administered medications for this encounter.   BP 102/60   Pulse 85   Wt 58.8 kg (129  lb 9.6 oz)   LMP 02/04/1996   SpO2 95%   BMI 24.49 kg/m  Vitals:   12/13/21 1100  BP: 102/60  Pulse: 85  SpO2: 95%   Wt Readings from Last 3 Encounters:  12/13/21 58.8 kg (129 lb 9.6 oz)  10/21/21 59.2 kg (130 lb 9.6 oz)  10/15/21 59.7 kg (131 lb 9.6 oz)   General: NAD Neck: No JVD, no thyromegaly or thyroid nodule.  Lungs: Clear to auscultation bilaterally  with normal respiratory effort. CV: Nondisplaced PMI.  Heart regular S1/S2, no S3/S4, no murmur.  No peripheral edema.  No carotid bruit.  Normal pedal pulses.  Abdomen: Soft, nontender, no hepatosplenomegaly, no distention.  Skin: Intact without lesions or rashes.  Neurologic: Alert and oriented x 3.  Psych: Normal affect. Extremities: No clubbing or cyanosis.  HEENT: Normal.   Assessment/Plan: 1. Pulmonary hypertension: Patient was noted on 3/23 echo to have moderately elevated PA pressure (60 mmHg).  Prior echoes had shown mildly elevated PA pressure.  RV was normal on this echo, there was moderate TR.  RHC in 5/23 showed mild pulmonary venous hypertension (group 2) with mildly elevated PCWP.  Diastolic LV dysfunction with elevated LA pressure (pulmonary venous hypertension) is the most likely etiology.  2. Chronic diastolic CHF: RHC in 7/34 showed elevated PCWP with prominent v-waves.  Echo reviewed, no more than mild MR so suspect elevated v-waves due to diastolic dysfunction.  She had pulmonary venous hypertension.  NYHA class II symptoms.  She is not volume overloaded on exam.  - Continue lasix to 80 mg daily and KCL to 20 qam/10 qpm.  BMET today.  - Off Farxiga due to persistent UTI.  Could consider retrying in the future if volume status worsens but seems to be doing well currently.  3. Atrial fibrillation: Paroxysmal. She is in NSR. She has periodic AF by device interrogation.  She has had an epicardial and endocardial ablation in the past.  She failed Tikosyn in the past and did not tolerate amiodarone due to side  effects.   - Continue Eliquis.  - Continue Toprol XL.  - If AF burden increases in the future, would like be candidate for redo ablation.  4. Tachy-brady syndrome: Has MDT PPM. Had gen change 07/17/21 5. Cirrhosis: ?NAFLD.  Does not drink any ETOH now, was not a heavy drinker before.  - Followed by [facility].   Followup 6 months with APP.   Loralie Champagne  12/15/2021

## 2022-01-15 ENCOUNTER — Other Ambulatory Visit (HOSPITAL_COMMUNITY): Payer: Self-pay | Admitting: Cardiology

## 2022-01-16 ENCOUNTER — Ambulatory Visit (INDEPENDENT_AMBULATORY_CARE_PROVIDER_SITE_OTHER): Payer: Medicare Other

## 2022-01-16 DIAGNOSIS — I495 Sick sinus syndrome: Secondary | ICD-10-CM | POA: Diagnosis not present

## 2022-01-16 LAB — CUP PACEART REMOTE DEVICE CHECK
Battery Remaining Longevity: 128 mo
Battery Voltage: 3.14 V
Brady Statistic AP VP Percent: 91.81 %
Brady Statistic AP VS Percent: 0.22 %
Brady Statistic AS VP Percent: 7.83 %
Brady Statistic AS VS Percent: 0.14 %
Brady Statistic RA Percent Paced: 83.04 %
Brady Statistic RV Percent Paced: 98.95 %
Date Time Interrogation Session: 20231214034646
Implantable Lead Connection Status: 753985
Implantable Lead Connection Status: 753985
Implantable Lead Implant Date: 20140917
Implantable Lead Implant Date: 20140917
Implantable Lead Location: 753859
Implantable Lead Location: 753860
Implantable Pulse Generator Implant Date: 20230614
Lead Channel Impedance Value: 304 Ohm
Lead Channel Impedance Value: 342 Ohm
Lead Channel Impedance Value: 361 Ohm
Lead Channel Impedance Value: 513 Ohm
Lead Channel Pacing Threshold Amplitude: 0.75 V
Lead Channel Pacing Threshold Amplitude: 0.875 V
Lead Channel Pacing Threshold Pulse Width: 0.4 ms
Lead Channel Pacing Threshold Pulse Width: 0.4 ms
Lead Channel Sensing Intrinsic Amplitude: 0.375 mV
Lead Channel Sensing Intrinsic Amplitude: 0.375 mV
Lead Channel Sensing Intrinsic Amplitude: 16.125 mV
Lead Channel Sensing Intrinsic Amplitude: 16.125 mV
Lead Channel Setting Pacing Amplitude: 1.75 V
Lead Channel Setting Pacing Amplitude: 2 V
Lead Channel Setting Pacing Pulse Width: 0.4 ms
Lead Channel Setting Sensing Sensitivity: 1.2 mV
Zone Setting Status: 755011

## 2022-01-24 ENCOUNTER — Other Ambulatory Visit: Payer: Self-pay | Admitting: Neurology

## 2022-01-28 DIAGNOSIS — H353132 Nonexudative age-related macular degeneration, bilateral, intermediate dry stage: Secondary | ICD-10-CM | POA: Diagnosis not present

## 2022-01-28 DIAGNOSIS — H35721 Serous detachment of retinal pigment epithelium, right eye: Secondary | ICD-10-CM | POA: Diagnosis not present

## 2022-01-28 DIAGNOSIS — H353211 Exudative age-related macular degeneration, right eye, with active choroidal neovascularization: Secondary | ICD-10-CM | POA: Diagnosis not present

## 2022-01-28 DIAGNOSIS — H43812 Vitreous degeneration, left eye: Secondary | ICD-10-CM | POA: Diagnosis not present

## 2022-01-29 ENCOUNTER — Other Ambulatory Visit: Payer: Self-pay

## 2022-01-29 MED ORDER — CARBIDOPA-LEVODOPA 25-100 MG PO TABS: ORAL_TABLET | ORAL | 1 refills | Status: DC

## 2022-02-04 DIAGNOSIS — M25512 Pain in left shoulder: Secondary | ICD-10-CM | POA: Diagnosis not present

## 2022-02-04 DIAGNOSIS — M25552 Pain in left hip: Secondary | ICD-10-CM | POA: Diagnosis not present

## 2022-02-06 ENCOUNTER — Telehealth (HOSPITAL_COMMUNITY): Payer: Self-pay | Admitting: Cardiology

## 2022-02-06 NOTE — Telephone Encounter (Signed)
Patient called with multiple concerns after fall 02/03/22 (no er visit went to ortho) Reports cramps in legs Pt reports he feels better while standing +SOB +dizziness Denies CP  B/p today 118/70 Now weight to report Pt is normally able to lay flat however she now feels full while lying   Please advise

## 2022-02-06 NOTE — Telephone Encounter (Signed)
Increase Lasix to 80 qam/40 qpm x 4 days then back to 80 mg daily.  Take an extra 20 mEq KCl with the increased Lasix.  Make followup soon with APP.

## 2022-02-07 NOTE — Progress Notes (Signed)
Remote pacemaker transmission.   

## 2022-02-07 NOTE — Telephone Encounter (Signed)
Spoke with pt she is aware and agreeable with plan.  

## 2022-02-12 DIAGNOSIS — S40012D Contusion of left shoulder, subsequent encounter: Secondary | ICD-10-CM | POA: Diagnosis not present

## 2022-02-12 DIAGNOSIS — M25512 Pain in left shoulder: Secondary | ICD-10-CM | POA: Diagnosis not present

## 2022-02-12 DIAGNOSIS — M25552 Pain in left hip: Secondary | ICD-10-CM | POA: Diagnosis not present

## 2022-02-12 DIAGNOSIS — S7002XD Contusion of left hip, subsequent encounter: Secondary | ICD-10-CM | POA: Diagnosis not present

## 2022-02-13 DIAGNOSIS — R1030 Lower abdominal pain, unspecified: Secondary | ICD-10-CM | POA: Diagnosis not present

## 2022-02-18 ENCOUNTER — Other Ambulatory Visit (HOSPITAL_COMMUNITY): Payer: Self-pay | Admitting: Internal Medicine

## 2022-02-18 ENCOUNTER — Telehealth (HOSPITAL_COMMUNITY): Payer: Self-pay | Admitting: Vascular Surgery

## 2022-02-18 DIAGNOSIS — D6869 Other thrombophilia: Secondary | ICD-10-CM | POA: Diagnosis not present

## 2022-02-18 DIAGNOSIS — J90 Pleural effusion, not elsewhere classified: Secondary | ICD-10-CM | POA: Diagnosis not present

## 2022-02-18 DIAGNOSIS — R0609 Other forms of dyspnea: Secondary | ICD-10-CM | POA: Diagnosis not present

## 2022-02-18 DIAGNOSIS — E871 Hypo-osmolality and hyponatremia: Secondary | ICD-10-CM | POA: Diagnosis not present

## 2022-02-18 DIAGNOSIS — R110 Nausea: Secondary | ICD-10-CM | POA: Diagnosis not present

## 2022-02-18 DIAGNOSIS — I5032 Chronic diastolic (congestive) heart failure: Secondary | ICD-10-CM | POA: Diagnosis not present

## 2022-02-18 DIAGNOSIS — R252 Cramp and spasm: Secondary | ICD-10-CM | POA: Diagnosis not present

## 2022-02-18 DIAGNOSIS — E039 Hypothyroidism, unspecified: Secondary | ICD-10-CM | POA: Diagnosis not present

## 2022-02-18 DIAGNOSIS — K7460 Unspecified cirrhosis of liver: Secondary | ICD-10-CM | POA: Diagnosis not present

## 2022-02-18 DIAGNOSIS — E538 Deficiency of other specified B group vitamins: Secondary | ICD-10-CM | POA: Diagnosis not present

## 2022-02-18 DIAGNOSIS — D649 Anemia, unspecified: Secondary | ICD-10-CM | POA: Diagnosis not present

## 2022-02-18 DIAGNOSIS — I509 Heart failure, unspecified: Secondary | ICD-10-CM | POA: Diagnosis not present

## 2022-02-18 NOTE — Telephone Encounter (Signed)
Pt called states she is having issues, she saw her pcp, they want her to see [facility] in this week please advise

## 2022-02-18 NOTE — Telephone Encounter (Signed)
Spoke with patient, pcp faxed over paperwork, made her an appointment with Dr. Aundra Dubin Thursday

## 2022-02-19 ENCOUNTER — Ambulatory Visit (HOSPITAL_COMMUNITY)
Admission: RE | Admit: 2022-02-19 | Discharge: 2022-02-19 | Disposition: A | Discharge status: Home / Self Care | Payer: Medicare Other | Source: Ambulatory Visit | Attending: Internal Medicine | Admitting: Internal Medicine

## 2022-02-19 ENCOUNTER — Encounter (HOSPITAL_COMMUNITY): Payer: Self-pay

## 2022-02-19 ENCOUNTER — Ambulatory Visit (HOSPITAL_COMMUNITY): Payer: Medicare Other

## 2022-02-19 DIAGNOSIS — R0609 Other forms of dyspnea: Secondary | ICD-10-CM | POA: Diagnosis not present

## 2022-02-19 DIAGNOSIS — D649 Anemia, unspecified: Secondary | ICD-10-CM | POA: Diagnosis not present

## 2022-02-19 DIAGNOSIS — R0600 Dyspnea, unspecified: Secondary | ICD-10-CM | POA: Diagnosis not present

## 2022-02-19 MED ORDER — IOHEXOL 350 MG/ML SOLN
75.0000 mL | Freq: Once | INTRAVENOUS | Status: AC | PRN
  Administered 2022-02-19: 75 mL via INTRAVENOUS

## 2022-02-20 ENCOUNTER — Other Ambulatory Visit (HOSPITAL_COMMUNITY): Payer: Self-pay

## 2022-02-20 ENCOUNTER — Encounter (HOSPITAL_COMMUNITY)
Admission: RE | Admit: 2022-02-20 | Discharge: 2022-02-20 | Disposition: A | Discharge status: Home / Self Care | Payer: Medicare Other | Source: Ambulatory Visit | Attending: Cardiology | Admitting: Cardiology

## 2022-02-20 ENCOUNTER — Encounter (HOSPITAL_COMMUNITY): Payer: Self-pay | Admitting: Cardiology

## 2022-02-20 ENCOUNTER — Telehealth (HOSPITAL_COMMUNITY): Payer: Self-pay

## 2022-02-20 ENCOUNTER — Ambulatory Visit (HOSPITAL_COMMUNITY)
Admission: RE | Admit: 2022-02-20 | Discharge: 2022-02-20 | Disposition: A | Discharge status: Home / Self Care | Payer: Medicare Other | Source: Ambulatory Visit | Attending: Cardiology | Admitting: Cardiology

## 2022-02-20 ENCOUNTER — Telehealth (HOSPITAL_COMMUNITY): Payer: Self-pay | Admitting: Cardiology

## 2022-02-20 VITALS — BP 110/78 | HR 78 | Wt 141.6 lb

## 2022-02-20 DIAGNOSIS — I110 Hypertensive heart disease with heart failure: Secondary | ICD-10-CM | POA: Insufficient documentation

## 2022-02-20 DIAGNOSIS — I5033 Acute on chronic diastolic (congestive) heart failure: Secondary | ICD-10-CM | POA: Insufficient documentation

## 2022-02-20 DIAGNOSIS — G20A1 Parkinson's disease without dyskinesia, without mention of fluctuations: Secondary | ICD-10-CM | POA: Diagnosis not present

## 2022-02-20 DIAGNOSIS — N390 Urinary tract infection, site not specified: Secondary | ICD-10-CM | POA: Diagnosis not present

## 2022-02-20 DIAGNOSIS — Z8719 Personal history of other diseases of the digestive system: Secondary | ICD-10-CM

## 2022-02-20 DIAGNOSIS — K760 Fatty (change of) liver, not elsewhere classified: Secondary | ICD-10-CM | POA: Insufficient documentation

## 2022-02-20 DIAGNOSIS — Z7901 Long term (current) use of anticoagulants: Secondary | ICD-10-CM | POA: Diagnosis not present

## 2022-02-20 DIAGNOSIS — I495 Sick sinus syndrome: Secondary | ICD-10-CM

## 2022-02-20 DIAGNOSIS — I4819 Other persistent atrial fibrillation: Secondary | ICD-10-CM

## 2022-02-20 DIAGNOSIS — I5032 Chronic diastolic (congestive) heart failure: Secondary | ICD-10-CM

## 2022-02-20 DIAGNOSIS — I480 Paroxysmal atrial fibrillation: Secondary | ICD-10-CM | POA: Diagnosis not present

## 2022-02-20 DIAGNOSIS — I272 Pulmonary hypertension, unspecified: Secondary | ICD-10-CM | POA: Insufficient documentation

## 2022-02-20 LAB — LIPID PANEL
Cholesterol: 133 mg/dL (ref 0–200)
HDL: 53 mg/dL (ref >40)
LDL Cholesterol: 71 mg/dL (ref 0–99)
Total CHOL/HDL Ratio: 2.5 RATIO
Triglycerides: 47 mg/dL (ref <150)
VLDL: 9 mg/dL (ref 0–40)

## 2022-02-20 LAB — CBC
HCT: 27.1 % — ABNORMAL LOW (ref 36.0–46.0)
Hemoglobin: 8.3 g/dL — ABNORMAL LOW (ref 12.0–15.0)
MCH: 31 pg (ref 26.0–34.0)
MCHC: 30.6 g/dL (ref 30.0–36.0)
MCV: 101.1 fL — ABNORMAL HIGH (ref 80.0–100.0)
Platelets: 257 10*3/uL (ref 150–400)
RBC: 2.68 MIL/uL — ABNORMAL LOW (ref 3.87–5.11)
RDW: 17.6 % — ABNORMAL HIGH (ref 11.5–15.5)
WBC: 7.9 10*3/uL (ref 4.0–10.5)
nRBC: 0 % (ref 0.0–0.2)

## 2022-02-20 LAB — COMPREHENSIVE METABOLIC PANEL
ALT: <5 U/L (ref 0–44)
AST: 27 U/L (ref 15–41)
Albumin: 3.4 g/dL — ABNORMAL LOW (ref 3.5–5.0)
Alkaline Phosphatase: 74 U/L (ref 38–126)
Anion gap: 8 (ref 5–15)
BUN: 15 mg/dL (ref 8–23)
CO2: 27 mmol/L (ref 22–32)
Calcium: 8.7 mg/dL — ABNORMAL LOW (ref 8.9–10.3)
Chloride: 100 mmol/L (ref 98–111)
Creatinine, Ser: 0.83 mg/dL (ref 0.44–1.00)
GFR, Estimated: >60 mL/min (ref >60)
Glucose, Bld: 104 mg/dL — ABNORMAL HIGH (ref 70–99)
Potassium: 4 mmol/L (ref 3.5–5.1)
Sodium: 135 mmol/L (ref 135–145)
Total Bilirubin: 2.2 mg/dL — ABNORMAL HIGH (ref 0.3–1.2)
Total Protein: 6.1 g/dL — ABNORMAL LOW (ref 6.5–8.1)

## 2022-02-20 LAB — MAGNESIUM: Magnesium: 2.2 mg/dL (ref 1.7–2.4)

## 2022-02-20 LAB — BRAIN NATRIURETIC PEPTIDE: B Natriuretic Peptide: 125.9 pg/mL — ABNORMAL HIGH (ref 0.0–100.0)

## 2022-02-20 LAB — LACTATE DEHYDROGENASE: LDH: 535 U/L — ABNORMAL HIGH (ref 98–192)

## 2022-02-20 MED ORDER — FUROSEMIDE 10 MG/ML IJ SOLN
INTRAMUSCULAR | Status: AC
  Filled 2022-02-20: qty 8

## 2022-02-20 MED ORDER — FUROSEMIDE 10 MG/ML IJ SOLN
80.0000 mg | Freq: Once | INTRAMUSCULAR | Status: AC
  Administered 2022-02-20: 80 mg via INTRAVENOUS

## 2022-02-20 MED ORDER — POTASSIUM CHLORIDE CRYS ER 20 MEQ PO TBCR
40.0000 meq | EXTENDED_RELEASE_TABLET | Freq: Once | ORAL | Status: AC
  Administered 2022-02-20: 40 meq via ORAL

## 2022-02-20 MED ORDER — FUROSEMIDE 10 MG/ML IJ SOLN: 80.0000 mg | Freq: Once | INTRAMUSCULAR | Status: DC

## 2022-02-20 MED ORDER — TORSEMIDE 20 MG PO TABS: 60.0000 mg | ORAL_TABLET | Freq: Every day | ORAL | 3 refills | Status: DC

## 2022-02-20 NOTE — Progress Notes (Signed)
Pt [facility] over for [facility].  PT received 80 mg IV Lasix and voided without difficulty after receiving the med.  Pt discharged to home with husband after successful void

## 2022-02-20 NOTE — Patient Instructions (Signed)
STOP lasix.  START Torsemide 60 mg daily on Sunday.  Your provider has order [name] for you. This is an on-body infuser that gives you a dose of Furosemide.   It will be shipped to your home   [name] will call you to discuss before shipping so, PLEASE answer unknown calls  For questions regarding the device call [name] at [phone]  Ensure you write down the time you start your infusion so that if there is a problem you will know how long the infusion lasted  Use [name] only AS DIRECTED by our office  Dosing Directions:   Day 1=  02/21/22 use 1 kit Friday  Day 2= 02/22/22 use 1 kit Saturday  Your physician has requested that you have an echocardiogram. Echocardiography is a painless test that uses sound waves to create images of your heart. It provides your doctor with information about the size and shape of your heart and how well your heart's chambers and valves are working. This procedure takes approximately one hour. There are no restrictions for this procedure. Please do NOT wear cologne, perfume, aftershave, or lotions (deodorant is allowed). Please arrive 15 minutes prior to your appointment time.  You are scheduled for a Cardiac Catheterization on Thursday, February 1 with Dr. Loralie Champagne.  1. Please arrive at the [facility] (Main Entrance A) at [facility]: [address] [place], [place] [address] at 5:30 AM (This time is two hours before your procedure to ensure your preparation). Free valet parking service is available.   Special note: Every effort is made to have your procedure done on time. Please understand that emergencies sometimes delay scheduled procedures.  2. Diet: Do not eat solid foods after midnight.  The patient may have clear liquids until 5am upon the day of the procedure.  3. Medication instructions in preparation for your procedure:   Contrast Allergy: No  HOLD Torsemide the morning of your procedure 03/06/22    On the morning of your procedure, take any morning medicines NOT listed above.  You may use sips of water.  5. Plan for one night stay--bring personal belongings. 6. Bring a current list of your medications and current insurance cards. 7. You MUST have a responsible person to drive you home. 8. Someone MUST be with you the first 24 hours after you arrive home or your discharge will be delayed. 9. Please wear clothes that are easy to get on and off and wear slip-on shoes.  YOU WILL BE CALLED TO HAVE YOUR THORACENTESIS SCHEDULED.  PLEASE CALL Dr. Curt Bears office to scheduled an appointment ASAP  Your physician recommends that you schedule a follow-up appointment as scheduled   If you have any questions or concerns before your next appointment please send [place] a message through [facility] or call our office at [phone].    TO LEAVE A MESSAGE FOR THE NURSE SELECT OPTION 2, PLEASE LEAVE A MESSAGE INCLUDING: YOUR NAME DATE OF BIRTH CALL BACK NUMBER REASON FOR CALL**this is important as we prioritize the call backs  YOU WILL RECEIVE A CALL BACK THE SAME DAY AS LONG AS YOU CALL BEFORE 4:00 PM  At the [facility], you and your health needs are our priority. As part of our continuing mission to provide you with exceptional heart care, we have created designated Provider Care Teams. These Care Teams include your primary Cardiologist (physician) and Advanced Practice Providers (APPs- Physician Assistants and Nurse Practitioners) who all work together to provide you  with the care you need, when you need it.   You may see any of the following providers on your designated Care Team at your next follow up: Dr Glori Bickers Dr Loralie Champagne Dr. Roxana Hires, NP Lyda Jester, [place] [facility] [place], [place] Forestine Na, NP Audry Riles, PharmD   Please be sure to bring in all your medications bottles to every appointment.

## 2022-02-20 NOTE — Addendum Note (Signed)
Encounter addended by: Larey Dresser, MD on: 02/20/2022 12:47 PM  Actions taken: Clinical Note Signed

## 2022-02-20 NOTE — Progress Notes (Addendum)
PCP: Crist Infante, MD EP: Dr. Curt Bears HF Cardiology: Dr. Aundra Dubin  82 y.o. 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  Patient had an echo done in 3/23 showing EF 65-70%, mild LVH, normal RV, PASP 60 mmHg, moderate biatrial enlargement, mild MR, moderate TR. Prior echoes had shown mildly elevated PA pressure. Cardiolite in 10/21 showed no ischemia.   RHC was done in 5/23 showing mild pulmonary venous hypertension with mildly elevated PCWP. She was started on Farxiga.  She developed a persistent UTI that was difficult to clear and stopped [place].   Today she returns for HF followup as an urgent work-in due to dyspnea. She reports a mechanical fall on 1/24 (tripped ).  Since that time, she reports progressively worsening exertional dyspnea.  She called in shortly after that to our office and was told to take Lasix 80 qam/40 qpm for 3 days then back to 80 daily.  This did not help.  She has been short of breath walking short distances, had some dyspnea walking into the office today.  Weight up 13 lbs.  She has noted fullness in her chest when sleeping and has had to prop up more (orthopnea).  No lightheadedness, no chest pain.  She does not feel palpitations.    CTA chest was done yesterday showing no PE, moderate right pleural effusion, mild pulmonary edema.   ECG (personally reviewed): A-V dual pacing  Medtronic device interrogation: 14.6% atrial fibrillation, more than in the past.   Labs (3/23): hgb 9.7 Labs (4/23): K 3.2, creatinine 0.92 Labs (6/23): creatinine 0.8  Labs (11/23): K 3.5, creatinine 0.7  PMH: 1. Atrial fibrillation: Paroxysmal.  S/p AF (epicardial and endocardial) ablation in 10/13.  2. Tachy-brady syndrome: Medtronic PPM placed in 9/14.  3. H/o atypical atrial flutter 4. Hyperlipidemia 5. HTN 6.  Hypothyroidism 7. Parkinsons disease: Followed by Dr. Carles Collet 8. Cirrhosis: ?NAFLD. No ETOH.  9. Chronic diastolic CHF/pulmonary hypertension:  - LHC (2014): No significant CAD.  - Cardiolite (10/21): EF 71%, no ischemia.  - Echo (3/23): EF 65-70%, mild LVH, normal RV, PASP 60 mmHg, moderate biatrial enlargement, mild MR, moderate TR.  - RHC (5/23): mean RA 8, PA 49/13 mean 30, mean PCWP 19 with v-waves to 30, CI 3.96, PVR 1.8 WU.  10. B12 deficiency.   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   Current Outpatient Medications  Medication Sig Dispense Refill   apixaban (ELIQUIS) 5 MG TABS tablet Take 1 tablet (5 mg total) by mouth 2 (two) times daily. 180 tablet 1   BELSOMRA 20 MG TABS Take 1 tablet by mouth at bedtime as needed (sleep).     CALCIUM PO Take 1 tablet by mouth at bedtime.      carbidopa-levodopa (SINEMET CR) 50-200 MG tablet TAKE ONE TABLET BY MOUTH EVERYDAY AT BEDTIME 90 tablet 0   carbidopa-levodopa (SINEMET IR) 25-100 MG tablet TAKE 2 TABLETS BY MOUTH 3 TIMES DAILY. 540 tablet 1   cholecalciferol (VITAMIN D3) 25 MCG (1000 UNIT) tablet Take 1,000 Units by mouth daily.     cyanocobalamin (,VITAMIN B-12,) 1000 MCG/ML injection INJECT 1ML INTRAMUSCULARLY IN THE DELTOID ONCE A MONTH (ALTERNATING DELT. EACH MONTH)     docusate sodium (COLACE) 100 MG capsule Take 100 mg by mouth in the morning and at bedtime.     levothyroxine (SYNTHROID) 50 MCG tablet Take 50-75 mcg by mouth See admin instructions. Pt take 1 1/2 tablet (75 mcg) on Monday and Thursday and 1 tablet (50 mcg)  the rest of the week     magnesium oxide (MAG-OX) 400 MG tablet Take 400 mg by mouth at bedtime.     metoprolol succinate (TOPROL-XL) 25 MG 24 hr tablet TAKE 1 TABLET BY MOUTH EVERY DAY 90 tablet 3   multivitamin-lutein (OCUVITE-LUTEIN) CAPS capsule Take 1 capsule by mouth daily.     NON FORMULARY Take 2 tablets by mouth daily. Liposomal Glutathione take     OVER THE COUNTER MEDICATION Take 1 capsule by mouth daily. MetaCalm     polycarbophil (FIBERCON) 625 MG tablet Take 1,250 mg by mouth daily.     potassium chloride (MICRO-K) 10 MEQ CR capsule Take 10 mEq by mouth 2 (two) times daily.     Probiotic Product (PROBIOTIC-10 PO) Take 1 tablet by mouth daily. Lafonda Mosses Brand     Propylene Glycol (SYSTANE BALANCE) 0.6 % SOLN Place 1 drop into both eyes daily as needed (dry eyes).     rasagiline (AZILECT) 1 MG TABS tablet TAKE 1 TABLET BY MOUTH EVERY DAY 90 tablet 0   [START ON 02/22/2022] torsemide (DEMADEX) 20 MG tablet Take 3 tablets (60 mg total) by mouth daily. 180 tablet 3   valACYclovir (VALTREX) 1000 MG tablet Take 1,000 mg by mouth 2 (two) times daily as needed (fever blisters).     vitamin B-12 (CYANOCOBALAMIN) 100 MCG tablet Take 100 mcg by mouth daily.     vitamin C (ASCORBIC ACID) 500 MG tablet Take  500 mg by mouth daily.     Acetylcysteine (NAC 600) 600 MG CAPS Take 600 mg by mouth daily. (Patient not taking: Reported on 02/20/2022)     Current Facility-Administered Medications  Medication Dose Route Frequency Provider Last Rate Last Admin   furosemide (LASIX) injection 80 mg  80 mg Intravenous Once Larey Dresser, MD       Facility-Administered Medications Ordered in Other Encounters  Medication Dose Route Frequency Provider Last Rate Last Admin   furosemide (LASIX) 10 MG/ML injection            BP  110/78   Pulse 78   Wt 64.2 kg (141 lb 9.6 oz)   LMP 02/04/1996   SpO2 97%   BMI 26.76 kg/m  Vitals:   02/20/22 0843  BP: 110/78  Pulse: 78  SpO2: 97%   Wt Readings from Last 3 Encounters:  02/20/22 64.2 kg (141 lb 9.6 oz)  12/13/21 58.8 kg (129 lb 9.6 oz)  10/21/21 59.2 kg (130 lb 9.6 oz)   General: NAD Neck: JVP 12 cm, no thyromegaly or thyroid nodule.  Lungs: Decreased BS right base.  CV: Nondisplaced PMI.  Heart regular S1/S2, no S3/S4, no murmur.  1+ edema 1/2 to knees bilaterally.  No carotid bruit.  Normal pedal pulses.  Abdomen: Soft, nontender, no hepatosplenomegaly, mild distention.  Skin: Intact without lesions or rashes.  Neurologic: Alert and oriented x 3.  Psych: Normal affect. Extremities: No clubbing or cyanosis.  HEENT: Normal.   Assessment/Plan: 1. Pulmonary hypertension: Patient was noted on 3/23 echo to have moderately elevated PA pressure (60 mmHg).  Prior echoes had shown mildly elevated PA pressure.  RV was normal on this echo, there was moderate TR.  RHC in 5/23 showed mild pulmonary venous hypertension (group 2) with mildly elevated PCWP.  Diastolic LV dysfunction with elevated LA pressure (pulmonary venous hypertension) is the most likely etiology.  2. Acute on chronic diastolic CHF: Echo in 5/95 with EF 65-70%, mild LVH, normal RV, PASP 60 mmHg, moderate biatrial enlargement, mild MR, moderate TR. RHC in 5/23 showed elevated PCWP with prominent  v-waves.  Echo from 3/23 reviewed, no more than mild MR so suspect elevated v-waves due to diastolic dysfunction.  She had pulmonary venous hypertension.  She has had worsening symptoms over the last 2 weeks despite transiently increasing her Lasix.  She has gained 13 lbs and is volume overloaded on exam.  NYHA class III symptoms.  - I will arrange for repeat echo, make sure EF is not declining with chronic RV pacing though EF was normal in 3/23.  - Stop po Lasix.  Lasix 80 mg IV x 1 today.  Tomorrow and Saturday, will have her take Lasix [place] 80 mg IV daily ([name]).  Starting Sunday she will begin torsemide 60 mg daily.  Can increase as needed.   - KCl 20 bid.  - BMET/BNP today and again next week.  - Off Farxiga due to persistent UTI. Suspect she has UTI today.  - Will add spironolactone next.  - Next week, would like to bring her back for RHC to assess filling pressures and PA pressure.  3. Atrial fibrillation: Paroxysmal. She is in NSR today. She has periodic AF by device interrogation, higher burden over the last few weeks.  She has had an epicardial and endocardial ablation in the past.  She failed Tikosyn in the past and did not tolerate amiodarone due to side effects. Query whether increased AF burden triggered her CHF or is a product of worsening volume retention.   - Continue Eliquis.  - Continue Toprol XL.  - Will have her seen by Dr. Curt Bears when volume is improved to consider redo ablation. There is not a good anti-arrhythmic option for her.  4. Tachy-brady syndrome: Has MDT PPM. Had gen change 07/17/21 5. Cirrhosis: ?NAFLD.  Does not drink any ETOH now, was not a heavy drinker before.  - Followed by [facility].  6. Nurse was cleaning up after patient left, suspects UTI based on appearance/smell of urine. Will ask PCP to consider treatment.   Followup  2 wks with APP.   Loralie Champagne  02/20/2022

## 2022-02-20 NOTE — Telephone Encounter (Signed)
Form faxed to [facility] on 02/20/22 at 1.30pm

## 2022-02-20 NOTE — H&P (View-Only) (Signed)
PCP: Crist Infante, MD EP: Dr. Curt Bears HF Cardiology: Dr. Aundra Dubin  82 y.o. 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  Patient had an echo done in 3/23 showing EF 65-70%, mild LVH, normal RV, PASP 60 mmHg, moderate biatrial enlargement, mild MR, moderate TR. Prior echoes had shown mildly elevated PA pressure. Cardiolite in 10/21 showed no ischemia.   RHC was done in 5/23 showing mild pulmonary venous hypertension with mildly elevated PCWP. She was started on Farxiga.  She developed a persistent UTI that was difficult to clear and stopped [place].   Today she returns for HF followup as an urgent work-in due to dyspnea. She reports a mechanical fall on 1/24 (tripped ).  Since that time, she reports progressively worsening exertional dyspnea.  She called in shortly after that to our office and was told to take Lasix 80 qam/40 qpm for 3 days then back to 80 daily.  This did not help.  She has been short of breath walking short distances, had some dyspnea walking into the office today.  Weight up 13 lbs.  She has noted fullness in her chest when sleeping and has had to prop up more (orthopnea).  No lightheadedness, no chest pain.  She does not feel palpitations.    CTA chest was done yesterday showing no PE, moderate right pleural effusion, mild pulmonary edema.   ECG (personally reviewed): A-V dual pacing  Medtronic device interrogation: 14.6% atrial fibrillation, more than in the past.   Labs (3/23): hgb 9.7 Labs (4/23): K 3.2, creatinine 0.92 Labs (6/23): creatinine 0.8  Labs (11/23): K 3.5, creatinine 0.7  PMH: 1. Atrial fibrillation: Paroxysmal.  S/p AF (epicardial and endocardial) ablation in 10/13.  2. Tachy-brady syndrome: Medtronic PPM placed in 9/14.  3. H/o atypical atrial flutter 4. Hyperlipidemia 5. HTN 6.  Hypothyroidism 7. Parkinsons disease: Followed by Dr. Carles Collet 8. Cirrhosis: ?NAFLD. No ETOH.  9. Chronic diastolic CHF/pulmonary hypertension:  - LHC (2014): No significant CAD.  - Cardiolite (10/21): EF 71%, no ischemia.  - Echo (3/23): EF 65-70%, mild LVH, normal RV, PASP 60 mmHg, moderate biatrial enlargement, mild MR, moderate TR.  - RHC (5/23): mean RA 8, PA 49/13 mean 30, mean PCWP 19 with v-waves to 30, CI 3.96, PVR 1.8 WU.  10. B12 deficiency.   Social History   Socioeconomic History   Marital status: Married    Spouse name: Not on file   Number of children: 2   Years of education: Not on file   Highest education level: Master's degree (e.g., MA, MS, MEng, MEd, MSW, MBA)  Occupational History   Not on file  Tobacco Use   Smoking status: Never   Smokeless tobacco: Never  Vaping Use   Vaping Use: Never used  Substance and Sexual Activity   Alcohol use: No   Drug use: No   Sexual activity: Yes    Partners: Male    Birth control/protection: Post-menopausal  Other Topics Concern   Not on file  Social History Narrative   Lives in [place].  Retired Licensed conveyancer.   Right handed    Social Determinants of Health   Financial Resource Strain: Not on file  Food Insecurity: Not on file  Transportation Needs: Not on file  Physical Activity: Not on file  Stress: Not on file  Social Connections: Not on file  Intimate Partner Violence: Not on  file   Family History  Problem Relation Age of Onset   Alzheimer's disease Mother    Heart failure Father    Healthy Child    Healthy Child    Breast cancer Maternal Aunt    ROS: All systems reviewed and negative except as per HPI.   Current Outpatient Medications  Medication Sig Dispense Refill   apixaban (ELIQUIS) 5 MG TABS tablet Take 1 tablet (5 mg total) by mouth 2 (two) times daily. 180 tablet 1   BELSOMRA 20 MG TABS Take 1 tablet by mouth at bedtime as needed (sleep).     CALCIUM PO Take 1 tablet by mouth at bedtime.      carbidopa-levodopa (SINEMET CR) 50-200 MG tablet TAKE ONE TABLET BY MOUTH EVERYDAY AT BEDTIME 90 tablet 0   carbidopa-levodopa (SINEMET IR) 25-100 MG tablet TAKE 2 TABLETS BY MOUTH 3 TIMES DAILY. 540 tablet 1   cholecalciferol (VITAMIN D3) 25 MCG (1000 UNIT) tablet Take 1,000 Units by mouth daily.     cyanocobalamin (,VITAMIN B-12,) 1000 MCG/ML injection INJECT 1ML INTRAMUSCULARLY IN THE DELTOID ONCE A MONTH (ALTERNATING DELT. EACH MONTH)     docusate sodium (COLACE) 100 MG capsule Take 100 mg by mouth in the morning and at bedtime.     levothyroxine (SYNTHROID) 50 MCG tablet Take 50-75 mcg by mouth See admin instructions. Pt take 1 1/2 tablet (75 mcg) on Monday and Thursday and 1 tablet (50 mcg)  the rest of the week     magnesium oxide (MAG-OX) 400 MG tablet Take 400 mg by mouth at bedtime.     metoprolol succinate (TOPROL-XL) 25 MG 24 hr tablet TAKE 1 TABLET BY MOUTH EVERY DAY 90 tablet 3   multivitamin-lutein (OCUVITE-LUTEIN) CAPS capsule Take 1 capsule by mouth daily.     NON FORMULARY Take 2 tablets by mouth daily. Liposomal Glutathione take     OVER THE COUNTER MEDICATION Take 1 capsule by mouth daily. MetaCalm     polycarbophil (FIBERCON) 625 MG tablet Take 1,250 mg by mouth daily.     potassium chloride (MICRO-K) 10 MEQ CR capsule Take 10 mEq by mouth 2 (two) times daily.     Probiotic Product (PROBIOTIC-10 PO) Take 1 tablet by mouth daily. Lafonda Mosses Brand     Propylene Glycol (SYSTANE BALANCE) 0.6 % SOLN Place 1 drop into both eyes daily as needed (dry eyes).     rasagiline (AZILECT) 1 MG TABS tablet TAKE 1 TABLET BY MOUTH EVERY DAY 90 tablet 0   [START ON 02/22/2022] torsemide (DEMADEX) 20 MG tablet Take 3 tablets (60 mg total) by mouth daily. 180 tablet 3   valACYclovir (VALTREX) 1000 MG tablet Take 1,000 mg by mouth 2 (two) times daily as needed (fever blisters).     vitamin B-12 (CYANOCOBALAMIN) 100 MCG tablet Take 100 mcg by mouth daily.     vitamin C (ASCORBIC ACID) 500 MG tablet Take  500 mg by mouth daily.     Acetylcysteine (NAC 600) 600 MG CAPS Take 600 mg by mouth daily. (Patient not taking: Reported on 02/20/2022)     Current Facility-Administered Medications  Medication Dose Route Frequency Provider Last Rate Last Admin   furosemide (LASIX) injection 80 mg  80 mg Intravenous Once Larey Dresser, MD       Facility-Administered Medications Ordered in Other Encounters  Medication Dose Route Frequency Provider Last Rate Last Admin   furosemide (LASIX) 10 MG/ML injection            BP  110/78   Pulse 78   Wt 64.2 kg (141 lb 9.6 oz)   LMP 02/04/1996   SpO2 97%   BMI 26.76 kg/m  Vitals:   02/20/22 0843  BP: 110/78  Pulse: 78  SpO2: 97%   Wt Readings from Last 3 Encounters:  02/20/22 64.2 kg (141 lb 9.6 oz)  12/13/21 58.8 kg (129 lb 9.6 oz)  10/21/21 59.2 kg (130 lb 9.6 oz)   General: NAD Neck: JVP 12 cm, no thyromegaly or thyroid nodule.  Lungs: Decreased BS right base.  CV: Nondisplaced PMI.  Heart regular S1/S2, no S3/S4, no murmur.  1+ edema 1/2 to knees bilaterally.  No carotid bruit.  Normal pedal pulses.  Abdomen: Soft, nontender, no hepatosplenomegaly, mild distention.  Skin: Intact without lesions or rashes.  Neurologic: Alert and oriented x 3.  Psych: Normal affect. Extremities: No clubbing or cyanosis.  HEENT: Normal.   Assessment/Plan: 1. Pulmonary hypertension: Patient was noted on 3/23 echo to have moderately elevated PA pressure (60 mmHg).  Prior echoes had shown mildly elevated PA pressure.  RV was normal on this echo, there was moderate TR.  RHC in 5/23 showed mild pulmonary venous hypertension (group 2) with mildly elevated PCWP.  Diastolic LV dysfunction with elevated LA pressure (pulmonary venous hypertension) is the most likely etiology.  2. Acute on chronic diastolic CHF: Echo in 7/56 with EF 65-70%, mild LVH, normal RV, PASP 60 mmHg, moderate biatrial enlargement, mild MR, moderate TR. RHC in 5/23 showed elevated PCWP with prominent  v-waves.  Echo from 3/23 reviewed, no more than mild MR so suspect elevated v-waves due to diastolic dysfunction.  She had pulmonary venous hypertension.  She has had worsening symptoms over the last 2 weeks despite transiently increasing her Lasix.  She has gained 13 lbs and is volume overloaded on exam.  NYHA class III symptoms.  - I will arrange for repeat echo, make sure EF is not declining with chronic RV pacing though EF was normal in 3/23.  - Stop po Lasix.  Lasix 80 mg IV x 1 today.  Tomorrow and Saturday, will have her take Lasix [place] 80 mg IV daily ([name]).  Starting Sunday she will begin torsemide 60 mg daily.  Can increase as needed.   - KCl 20 bid.  - BMET/BNP today and again next week.  - Off Farxiga due to persistent UTI. Suspect she has UTI today.  - Will add spironolactone next.  - Next week, would like to bring her back for RHC to assess filling pressures and PA pressure.  3. Atrial fibrillation: Paroxysmal. She is in NSR today. She has periodic AF by device interrogation, higher burden over the last few weeks.  She has had an epicardial and endocardial ablation in the past.  She failed Tikosyn in the past and did not tolerate amiodarone due to side effects. Query whether increased AF burden triggered her CHF or is a product of worsening volume retention.   - Continue Eliquis.  - Continue Toprol XL.  - Will have her seen by Dr. Curt Bears when volume is improved to consider redo ablation. There is not a good anti-arrhythmic option for her.  4. Tachy-brady syndrome: Has MDT PPM. Had gen change 07/17/21 5. Cirrhosis: ?NAFLD.  Does not drink any ETOH now, was not a heavy drinker before.  - Followed by [facility].  6. Nurse was cleaning up after patient left, suspects UTI based on appearance/smell of urine. Will ask PCP to consider treatment.   Followup  2 wks with APP.   Loralie Champagne  02/20/2022

## 2022-02-20 NOTE — Telephone Encounter (Signed)
-----  Message from Larey Dresser, MD sent at 02/20/2022  4:23 PM EST ----- Hgb low.  Any melena/hematochezia? Would obtain Fe studies (Fe, TIBC, ferritin) with next labs.

## 2022-02-20 NOTE — Telephone Encounter (Signed)
Patient called.  Patient aware. Reports some diarrhea with blood and dark stools 2 weeks Labs added

## 2022-02-21 ENCOUNTER — Telehealth (HOSPITAL_COMMUNITY): Payer: Self-pay | Admitting: Vascular Surgery

## 2022-02-21 NOTE — Telephone Encounter (Signed)
Lvm giving thoracentesis 1/23 @ 10 am arrival 9:30

## 2022-02-22 ENCOUNTER — Telehealth: Payer: Self-pay | Admitting: Physician Assistant

## 2022-02-22 MED ORDER — POTASSIUM CHLORIDE ER 10 MEQ PO CPCR: ORAL_CAPSULE | ORAL | 1 refills | Status: DC

## 2022-02-22 MED ORDER — METOLAZONE 2.5 MG PO TABS: ORAL_TABLET | ORAL | 1 refills | Status: DC

## 2022-02-22 NOTE — Telephone Encounter (Addendum)
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 She was seen by Dr. Aundra Dubin in the HF clinic on 02/20/22 for shortness of breath, acute HF. Recent CT neg for PE. She was given a dose of Lasix IV 80 x 1. She was instructed to take Lasix [place] 80 daily ([name]) on Fri and Sat. She is to start Torsemide 60 tomorrow Nancy Fetter). RHC is pending next week. F/u appt 2/7 with APP.  Pt called answering service regarding [employer]urs[facility]. I spoke to her daughter Dorian Pod (DPR on file). The pt did not get the [name] and has not had a dose. She felt worse last night and could not sleep (?paroxysmal nocturnal dyspnea). She has not had chest pain. She gets short of breath with minimal activity. She has not had syncope. She has been having nausea, diarrhea.  PLAN: I spoke to Dr. Haroldine Laws (CHF attending today). We cannot get her Fursoscix over the weekend. Will adjust diuretics and send her to the ED if no better. -Torsemide 60 mg twice daily x 2 days -Metolazone 2.5 mg once daily x 2 days -K+ 40 mEq twice daily x 2 days On Monday, change to: -Torsemide 60 mg once daily  -K+ 20 mEq twice daily  Advised pt and daughter to go to ED if she feels worse or does not experience increased diuresis. MyChart message sent with instructions.  Richardson Dopp, PA-C    02/22/2022 8:58 AM

## 2022-02-22 NOTE — Telephone Encounter (Signed)
Please check with the patient and see how she's doing on Monday morning.  Please call the [name] rep who was supposed to be helping [place] out with this and have her contact me Monday ASAP about this.

## 2022-02-23 NOTE — Telephone Encounter (Signed)
Daughter [name] [name] called again today (DPR on file) Pt had massive diuresis yesterday. She lost 10 lbs. She had significant leg cramps last night. She has a hx of this. Her breathing is significantly improved and she feels better.   PLAN:  -Stop Metolazone (do not take today) -Hold AM Torsemide; take Torsemide 40 mg this afternoon -Continue K+ 40 mEq twice daily today only -Resume planned regimen tomorrow (Torsemide 60, K+ 20 twice daily) -She will need a BMET on Monday  Richardson Dopp, PA-C    02/23/2022 8:05 AM

## 2022-02-23 NOTE — Telephone Encounter (Signed)
Please arrange BMET Monday.

## 2022-02-24 ENCOUNTER — Telehealth (HOSPITAL_COMMUNITY): Payer: Self-pay

## 2022-02-24 ENCOUNTER — Telehealth (HOSPITAL_COMMUNITY): Payer: Self-pay | Admitting: Cardiology

## 2022-02-24 ENCOUNTER — Ambulatory Visit (HOSPITAL_COMMUNITY)
Admission: RE | Admit: 2022-02-24 | Discharge: 2022-02-24 | Disposition: A | Discharge status: Home / Self Care | Payer: Medicare Other | Source: Ambulatory Visit | Attending: Internal Medicine | Admitting: Internal Medicine

## 2022-02-24 DIAGNOSIS — I5033 Acute on chronic diastolic (congestive) heart failure: Secondary | ICD-10-CM | POA: Diagnosis not present

## 2022-02-24 DIAGNOSIS — I272 Pulmonary hypertension, unspecified: Secondary | ICD-10-CM | POA: Diagnosis not present

## 2022-02-24 DIAGNOSIS — I5032 Chronic diastolic (congestive) heart failure: Secondary | ICD-10-CM | POA: Diagnosis not present

## 2022-02-24 DIAGNOSIS — I480 Paroxysmal atrial fibrillation: Secondary | ICD-10-CM | POA: Diagnosis not present

## 2022-02-24 LAB — IRON AND TIBC
Iron: 52 ug/dL (ref 28–170)
Saturation Ratios: 13 % (ref 10.4–31.8)
TIBC: 406 ug/dL (ref 250–450)
UIBC: 354 ug/dL

## 2022-02-24 LAB — BASIC METABOLIC PANEL
Anion gap: 10 (ref 5–15)
BUN: 20 mg/dL (ref 8–23)
CO2: 37 mmol/L — ABNORMAL HIGH (ref 22–32)
Calcium: 9.2 mg/dL (ref 8.9–10.3)
Chloride: 90 mmol/L — ABNORMAL LOW (ref 98–111)
Creatinine, Ser: 1.08 mg/dL — ABNORMAL HIGH (ref 0.44–1.00)
GFR, Estimated: 52 mL/min — ABNORMAL LOW (ref >60)
Glucose, Bld: 91 mg/dL (ref 70–99)
Potassium: 3.1 mmol/L — ABNORMAL LOW (ref 3.5–5.1)
Sodium: 137 mmol/L (ref 135–145)

## 2022-02-24 LAB — FERRITIN: Ferritin: 88 ng/mL (ref 11–307)

## 2022-02-24 NOTE — Telephone Encounter (Signed)
[name] called wanting to know if you knew that [name] was a contraindication due to her being dx with Cirrhosis.   PU#[phone] Mel Almond

## 2022-02-24 NOTE — Telephone Encounter (Signed)
Cirrhosis is not severe (mild) so do not think that would be a contraindication.

## 2022-02-24 NOTE — Telephone Encounter (Signed)
I spoke to patient and she has taken 2 today. She will take the extra and we will inform of any changes after labs.

## 2022-02-24 NOTE — Telephone Encounter (Signed)
She can take an extra dose of her KCl today but needs BMET ideally today.

## 2022-02-24 NOTE — Telephone Encounter (Signed)
Patient called and stated she is experiencing bad leg cramps and wants to know if she should increase her potassium? Please advise

## 2022-02-24 NOTE — Telephone Encounter (Signed)
[facility] DIRECT PA APPROVED AS OF 1*22*24 HUB ID [id number]

## 2022-02-24 NOTE — Telephone Encounter (Signed)
Patient advised and verbalized understanding. Lab order scheduled

## 2022-02-25 ENCOUNTER — Ambulatory Visit (HOSPITAL_COMMUNITY)
Admission: RE | Admit: 2022-02-25 | Discharge: 2022-02-25 | Disposition: A | Discharge status: Home / Self Care | Payer: Medicare Other | Source: Ambulatory Visit | Attending: Cardiology | Admitting: Cardiology

## 2022-02-25 ENCOUNTER — Other Ambulatory Visit (HOSPITAL_COMMUNITY): Payer: Self-pay | Admitting: Cardiology

## 2022-02-25 DIAGNOSIS — I5032 Chronic diastolic (congestive) heart failure: Secondary | ICD-10-CM

## 2022-02-25 DIAGNOSIS — J90 Pleural effusion, not elsewhere classified: Secondary | ICD-10-CM | POA: Diagnosis not present

## 2022-02-25 HISTORY — PX: IR THORACENTESIS ASP PLEURAL SPACE W/IMG GUIDE: IMG5380

## 2022-02-25 LAB — BODY FLUID CELL COUNT WITH DIFFERENTIAL
Eos, Fluid: 0 %
Lymphs, Fluid: 54 %
Monocyte-Macrophage-Serous Fluid: 40 % — ABNORMAL LOW (ref 50–90)
Neutrophil Count, Fluid: 6 % (ref 0–25)
Total Nucleated Cell Count, Fluid: 1139 cu mm — ABNORMAL HIGH (ref 0–1000)

## 2022-02-25 LAB — LACTATE DEHYDROGENASE, PLEURAL OR PERITONEAL FLUID: LD, Fluid: 204 U/L — ABNORMAL HIGH (ref 3–23)

## 2022-02-25 LAB — PROTEIN, PLEURAL OR PERITONEAL FLUID: Total protein, fluid: <3 g/dL

## 2022-02-25 NOTE — Telephone Encounter (Signed)
[name] advised; they said patient told them she didn't need it anymore but will place it on hold for her; in case she needs it in the future.

## 2022-02-25 NOTE — Procedures (Signed)
PROCEDURE SUMMARY:  Successful image-guided right thoracentesis. Yielded 350 mL of hazy, tea colored fluid. Pt tolerated procedure well. No immediate complications. EBL = trace   Specimen was sent for labs. CXR ordered.  Please see imaging section of Epic for full dictation.  Armando Gang Jeaninne Lodico PA-C 02/25/2022 10:15 AM

## 2022-02-26 DIAGNOSIS — E039 Hypothyroidism, unspecified: Secondary | ICD-10-CM | POA: Diagnosis not present

## 2022-02-27 LAB — PATHOLOGIST SMEAR REVIEW

## 2022-03-03 ENCOUNTER — Other Ambulatory Visit (HOSPITAL_COMMUNITY): Payer: Medicare Other

## 2022-03-03 ENCOUNTER — Telehealth (HOSPITAL_COMMUNITY): Payer: Self-pay | Admitting: Cardiology

## 2022-03-03 MED ORDER — POTASSIUM CHLORIDE ER 10 MEQ PO CPCR: 40.0000 meq | ORAL_CAPSULE | Freq: Two times a day (BID) | ORAL | 3 refills | Status: DC

## 2022-03-03 NOTE — Telephone Encounter (Signed)
Patient called.  Patient aware. -per PCP was referred to hematologist based on history of low iron -hemo appt 2/5

## 2022-03-03 NOTE — Telephone Encounter (Signed)
-----  Message from Larey Dresser, MD sent at 02/28/2022  9:11 PM EST ----- Add an additional KCl 20 mEq daily to her regimen. BMET 1 week.

## 2022-03-06 ENCOUNTER — Encounter (HOSPITAL_COMMUNITY): Payer: Self-pay | Admitting: Cardiology

## 2022-03-06 ENCOUNTER — Ambulatory Visit (HOSPITAL_COMMUNITY)
Admission: RE | Disposition: A | Discharge status: Home / Self Care | Payer: Self-pay | Source: Home / Self Care | Attending: Cardiology

## 2022-03-06 ENCOUNTER — Other Ambulatory Visit: Payer: Self-pay

## 2022-03-06 ENCOUNTER — Telehealth (HOSPITAL_COMMUNITY): Payer: Self-pay | Admitting: *Deleted

## 2022-03-06 ENCOUNTER — Ambulatory Visit (HOSPITAL_COMMUNITY)
Admission: RE | Admit: 2022-03-06 | Discharge: 2022-03-06 | Disposition: A | Discharge status: Home / Self Care | Payer: Medicare Other | Attending: Cardiology | Admitting: Cardiology

## 2022-03-06 DIAGNOSIS — I495 Sick sinus syndrome: Secondary | ICD-10-CM | POA: Diagnosis not present

## 2022-03-06 DIAGNOSIS — I272 Pulmonary hypertension, unspecified: Secondary | ICD-10-CM | POA: Insufficient documentation

## 2022-03-06 DIAGNOSIS — I5033 Acute on chronic diastolic (congestive) heart failure: Secondary | ICD-10-CM | POA: Insufficient documentation

## 2022-03-06 DIAGNOSIS — I110 Hypertensive heart disease with heart failure: Secondary | ICD-10-CM | POA: Insufficient documentation

## 2022-03-06 DIAGNOSIS — Z79899 Other long term (current) drug therapy: Secondary | ICD-10-CM | POA: Diagnosis not present

## 2022-03-06 DIAGNOSIS — K7460 Unspecified cirrhosis of liver: Secondary | ICD-10-CM | POA: Diagnosis not present

## 2022-03-06 DIAGNOSIS — Z950 Presence of cardiac pacemaker: Secondary | ICD-10-CM | POA: Diagnosis not present

## 2022-03-06 DIAGNOSIS — I5032 Chronic diastolic (congestive) heart failure: Secondary | ICD-10-CM | POA: Diagnosis not present

## 2022-03-06 DIAGNOSIS — I5189 Other ill-defined heart diseases: Secondary | ICD-10-CM

## 2022-03-06 DIAGNOSIS — I480 Paroxysmal atrial fibrillation: Secondary | ICD-10-CM | POA: Insufficient documentation

## 2022-03-06 DIAGNOSIS — Z7901 Long term (current) use of anticoagulants: Secondary | ICD-10-CM | POA: Diagnosis not present

## 2022-03-06 HISTORY — PX: RIGHT HEART CATH: CATH118263

## 2022-03-06 LAB — POCT I-STAT EG7
Acid-Base Excess: 9 mmol/L — ABNORMAL HIGH (ref 0.0–2.0)
Bicarbonate: 34 mmol/L — ABNORMAL HIGH (ref 20.0–28.0)
Calcium, Ion: 1.22 mmol/L (ref 1.15–1.40)
HCT: 25 % — ABNORMAL LOW (ref 36.0–46.0)
Hemoglobin: 8.5 g/dL — ABNORMAL LOW (ref 12.0–15.0)
O2 Saturation: 72 %
Potassium: 3.7 mmol/L (ref 3.5–5.1)
Sodium: 136 mmol/L (ref 135–145)
TCO2: 36 mmol/L — ABNORMAL HIGH (ref 22–32)
pCO2, Ven: 49.1 mmHg (ref 44–60)
pH, Ven: 7.449 — ABNORMAL HIGH (ref 7.25–7.43)
pO2, Ven: 37 mmHg (ref 32–45)

## 2022-03-06 LAB — BASIC METABOLIC PANEL
Anion gap: 13 (ref 5–15)
BUN: 25 mg/dL — ABNORMAL HIGH (ref 8–23)
CO2: 34 mmol/L — ABNORMAL HIGH (ref 22–32)
Calcium: 9.3 mg/dL (ref 8.9–10.3)
Chloride: 89 mmol/L — ABNORMAL LOW (ref 98–111)
Creatinine, Ser: 0.87 mg/dL (ref 0.44–1.00)
GFR, Estimated: >60 mL/min (ref >60)
Glucose, Bld: 117 mg/dL — ABNORMAL HIGH (ref 70–99)
Potassium: 3.5 mmol/L (ref 3.5–5.1)
Sodium: 136 mmol/L (ref 135–145)

## 2022-03-06 LAB — CBC
HCT: 26.6 % — ABNORMAL LOW (ref 36.0–46.0)
Hemoglobin: 8.7 g/dL — ABNORMAL LOW (ref 12.0–15.0)
MCH: 31.4 pg (ref 26.0–34.0)
MCHC: 32.7 g/dL (ref 30.0–36.0)
MCV: 96 fL (ref 80.0–100.0)
Platelets: 197 10*3/uL (ref 150–400)
RBC: 2.77 MIL/uL — ABNORMAL LOW (ref 3.87–5.11)
RDW: 15.9 % — ABNORMAL HIGH (ref 11.5–15.5)
WBC: 4.6 10*3/uL (ref 4.0–10.5)
nRBC: 0 % (ref 0.0–0.2)

## 2022-03-06 SURGERY — RIGHT HEART CATH
Anesthesia: LOCAL

## 2022-03-06 MED ORDER — SODIUM CHLORIDE 0.9 % IV SOLN: 250.0000 mL | INTRAVENOUS | Status: DC | PRN

## 2022-03-06 MED ORDER — ACETAMINOPHEN 325 MG PO TABS: 650.0000 mg | ORAL_TABLET | ORAL | Status: DC | PRN

## 2022-03-06 MED ORDER — SODIUM CHLORIDE 0.9% FLUSH: 3.0000 mL | INTRAVENOUS | Status: DC | PRN

## 2022-03-06 MED ORDER — LIDOCAINE HCL (PF) 1 % IJ SOLN
INTRAMUSCULAR | Status: AC
  Filled 2022-03-06: qty 30

## 2022-03-06 MED ORDER — SODIUM CHLORIDE 0.9 % IV SOLN: INTRAVENOUS | Status: DC

## 2022-03-06 MED ORDER — ONDANSETRON HCL 4 MG/2ML IJ SOLN: 4.0000 mg | Freq: Four times a day (QID) | INTRAMUSCULAR | Status: DC | PRN

## 2022-03-06 MED ORDER — TORSEMIDE 20 MG PO TABS: ORAL_TABLET | ORAL | 3 refills | Status: DC

## 2022-03-06 MED ORDER — HYDRALAZINE HCL 20 MG/ML IJ SOLN: 10.0000 mg | INTRAMUSCULAR | Status: DC | PRN

## 2022-03-06 MED ORDER — POTASSIUM CHLORIDE ER 10 MEQ PO CPCR: 60.0000 meq | ORAL_CAPSULE | Freq: Two times a day (BID) | ORAL | 3 refills | Status: DC

## 2022-03-06 MED ORDER — HEPARIN (PORCINE) IN NACL 1000-0.9 UT/500ML-% IV SOLN
INTRAVENOUS | Status: AC
  Filled 2022-03-06: qty 500

## 2022-03-06 MED ORDER — SODIUM CHLORIDE 0.9% FLUSH: 3.0000 mL | Freq: Two times a day (BID) | INTRAVENOUS | Status: DC

## 2022-03-06 MED ORDER — LABETALOL HCL 5 MG/ML IV SOLN: 10.0000 mg | INTRAVENOUS | Status: DC | PRN

## 2022-03-06 MED ORDER — LIDOCAINE HCL (PF) 1 % IJ SOLN
INTRAMUSCULAR | Status: DC | PRN
  Administered 2022-03-06: 5 mL

## 2022-03-06 SURGICAL SUPPLY — 6 items
CATH BALLN WEDGE 5F 110CM (CATHETERS) IMPLANT
KIT HEART LEFT (KITS) ×1 IMPLANT
PACK CARDIAC CATHETERIZATION (CUSTOM PROCEDURE TRAY) ×1 IMPLANT
SHEATH GLIDE SLENDER 4/5FR (SHEATH) IMPLANT
TRANSDUCER W/STOPCOCK (MISCELLANEOUS) ×1 IMPLANT
WIRE EMERALD 3MM-J .025X260CM (WIRE) IMPLANT

## 2022-03-06 NOTE — Telephone Encounter (Signed)
Called patient per Dr. Aundra Dubin with following instructions:  "Increase torsemide to 60 qam/40 qpm and KCl to 60 bid.  She needs BMET 1 week.  Make sure she has followup in office soon"  Pt verbalized understanding of above. She has f/u scheduled in Heart Failure APP clinic next week and will get labs at that time. Asked her to call [facility] at [phone] if any questions or concerns prior to that visit.

## 2022-03-06 NOTE — Discharge Instructions (Signed)
1. Increase torsemide to 60 mg in the morning and 40 mg in the afternoon (3 tabs and 2 tabs).  2. Increase potassium to 60 mEq in the morning and 60 mEq in the afternoon

## 2022-03-06 NOTE — Interval H&P Note (Signed)
History and Physical Interval Note:  03/06/2022 7:51 AM  [name] [name]  has presented today for surgery, with the diagnosis of CHF.  The various methods of treatment have been discussed with the patient and family. After consideration of risks, benefits and other options for treatment, the patient has consented to  Procedure(s): RIGHT HEART CATH (N/A) as a surgical intervention.  The patient's history has been reviewed, patient examined, no change in status, stable for surgery.  I have reviewed the patient's chart and labs.  Questions were answered to the patient's satisfaction.     Adrianna Dudas [employer]

## 2022-03-07 LAB — POCT I-STAT EG7
Acid-Base Excess: 9 mmol/L — ABNORMAL HIGH (ref 0.0–2.0)
Bicarbonate: 33.7 mmol/L — ABNORMAL HIGH (ref 20.0–28.0)
Calcium, Ion: 1.22 mmol/L (ref 1.15–1.40)
HCT: 26 % — ABNORMAL LOW (ref 36.0–46.0)
Hemoglobin: 8.8 g/dL — ABNORMAL LOW (ref 12.0–15.0)
O2 Saturation: 76 %
Potassium: 3.7 mmol/L (ref 3.5–5.1)
Sodium: 136 mmol/L (ref 135–145)
TCO2: 35 mmol/L — ABNORMAL HIGH (ref 22–32)
pCO2, Ven: 47.5 mmHg (ref 44–60)
pH, Ven: 7.459 — ABNORMAL HIGH (ref 7.25–7.43)
pO2, Ven: 39 mmHg (ref 32–45)

## 2022-03-07 MED FILL — Heparin Sod (Porcine)-NaCl IV Soln 1000 Unit/500ML-0.9%: INTRAVENOUS | Qty: 500 | Status: AC

## 2022-03-10 ENCOUNTER — Telehealth: Payer: Self-pay | Admitting: Hematology and Oncology

## 2022-03-10 ENCOUNTER — Inpatient Hospital Stay: Payer: Medicare Other

## 2022-03-10 ENCOUNTER — Other Ambulatory Visit: Payer: Self-pay

## 2022-03-10 ENCOUNTER — Inpatient Hospital Stay: Payer: Medicare Other | Attending: Hematology and Oncology | Admitting: Hematology and Oncology

## 2022-03-10 VITALS — BP 98/60 | HR 86 | Temp 97.3°F | Resp 16 | Wt 127.5 lb

## 2022-03-10 DIAGNOSIS — D649 Anemia, unspecified: Secondary | ICD-10-CM

## 2022-03-10 DIAGNOSIS — R195 Other fecal abnormalities: Secondary | ICD-10-CM | POA: Diagnosis not present

## 2022-03-10 DIAGNOSIS — Z79620 Long term (current) use of immunosuppressive biologic: Secondary | ICD-10-CM | POA: Diagnosis not present

## 2022-03-10 DIAGNOSIS — Z79899 Other long term (current) drug therapy: Secondary | ICD-10-CM | POA: Insufficient documentation

## 2022-03-10 DIAGNOSIS — Z803 Family history of malignant neoplasm of breast: Secondary | ICD-10-CM

## 2022-03-10 NOTE — Assessment & Plan Note (Signed)
Lab review: 06/11/2021: Hemoglobin 10.3, MCV 88.2 02/21/2022: Hemoglobin 8.3, MCV 101, ferritin 88, creatinine 1.08, iron saturation 13%, TIBC 406 03/06/2022: Hemoglobin 8.7, MCV 96, RDW 15.9  Differential diagnosis: 1. Anemia due to chronic disease and inflammation 2. anemia due to renal dysfunction 3. Combined B-12 and folate deficiency along with iron deficiency 4. Hemolysis 5. Hypothyroidism 6. Plasma cell disorders myeloma 7. Bone marrow dysfunction with MDS  Workup ordered: 1. CBC with differential to evaluate the smear 2. Haptoglobin, LDH, reticulocyte count to evaluate hemolysis 3. TSH 4. SPEP 5. O-67 and folic acid levels  The above testing is negative then we will consider doing a bone marrow biopsy.

## 2022-03-10 NOTE — Telephone Encounter (Signed)
Scheduled appointment per 2/5 los. Patient is aware.

## 2022-03-10 NOTE — Progress Notes (Signed)
[place] NOTE  Patient Care Team: Crist Infante, MD as PCP - General (Internal Medicine) Nahser, Wonda Cheng, MD as PCP - Cardiology (Cardiology) Constance Haw, MD as PCP - Electrophysiology (Cardiology) Carney Bern, MD as Referring Physician (Cardiothoracic Surgery) Geanie Cooley, MD as Consulting Physician (Internal Medicine) Tat, Eustace Quail, DO as Consulting Physician (Neurology)  CHIEF COMPLAINTS/PURPOSE OF CONSULTATION:  Chronic anemia with acute exacerbation  HISTORY OF PRESENTING ILLNESS:  [name] [name] 82 y.o. female is here because of chronic anemia.  She was being followed by Dr. Joylene Draft for anemia.  She is taking oral B12 supplementation and periodically gets an injection B12.  She was noted to be having worsening anemia this is because she was experiencing worsening fatigue.  Recent blood work did not show any clear-cut iron deficiency and hence she was referred to [place] for discussion of workup and treatment options.  She tells me that last year November she received stem cell infusions from a holistic doctor in [place].  This was supposed to be anti-inflammatory.  So far she has not noticed any significant benefits.  I reviewed her records extensively and collaborated the history with the patient.   MEDICAL HISTORY:  Past Medical History:  Diagnosis Date   Anemia    Arthritis    thumbs   Atrial tachycardia    Atypical atrial flutter (HCC)    Cataract    bilateral   Chronic anticoagulation    on Xarelto   Chronic diastolic CHF (congestive heart failure) (HCC)    Echocardiogram (11/24/12): Mild LVH, EF 60%.   Dysrhythmia    GERD (gastroesophageal reflux disease)    hx of no meds   Heart murmur    slight   Hyperlipidemia    a. Hx of leg weakness while on statin. under control   Hypertension    Hypothyroidism    Leg fracture Dec 21,2011   Osteoporosis 07/2007   Parkinson's disease 2011   Persistent atrial fibrillation  ([place])    s/p atrial fib ablation 11-11-11.    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[place])    Squamous carcinoma summer 2015   back of left thigh   Wrist fracture    right    SURGICAL HISTORY: Past Surgical History:  Procedure Laterality Date   ATRIAL FIBRILLATION ABLATION N/A 11/11/2011   Procedure: ATRIAL FIBRILLATION ABLATION;  Surgeon: Thompson Grayer, MD;  Location: [facility] CATH LAB;  Service: Cardiovascular;  Laterality: N/A;   BREAST BIOPSY  1998   BREAST SURGERY     benign cyst removed   CARDIAC ELECTROPHYSIOLOGY STUDY AND ABLATION  5/14, 7/14   convergent AF ablation at [facility] and subsequent atypical atrial flutter ablation by Dr Lehman Prom   CARDIOVASCULAR STRESS TEST  06-06-2008   EF 73%   CARDIOVERSION  09/09/2011   Procedure: CARDIOVERSION;  Surgeon: Darlin Coco, MD;  Location: [facility] ENDOSCOPY;  Service: Cardiovascular;  Laterality: N/A;  to be done by dr. Mare Ferrari   CARDIOVERSION  02/18/2012   Procedure: CARDIOVERSION;  Surgeon: Thayer Headings, MD;  Location: [facility] ENDOSCOPY;  Service: Cardiovascular;  Laterality: N/A;   [place]  2011   metal pin in place   FOREARM SURGERY Left 2010   "opened arm to drain it"   INSERTION  OF MESH N/A 11/24/2013   Procedure: INSERTION OF MESH;  Surgeon: Michael Boston, MD;  Location: [facility] ORS;  Service: General;  Laterality: N/A;   IR THORACENTESIS ASP PLEURAL SPACE W/IMG GUIDE  02/25/2022   KNEE SURGERY Left 2001   Left unicompartmental knee     Dr. Alvan Dame 11/18/16   PACEMAKER PLACEMENT  10/20/12   MDT Advisa DR implanted by Dr Lehman Prom at [place] Left 11/18/2016   Procedure: LEFT UNICOMPARTMENTAL KNEE Medially;  Surgeon: Paralee Cancel, MD;  Location: [facility] ORS;  Service: Orthopedics;  Laterality: Left;  90 mins   PPM GENERATOR CHANGEOUT  N/A 07/17/2021   Procedure: PPM GENERATOR CHANGEOUT;  Surgeon: Constance Haw, MD;  Location: [place] CV LAB;  Service: Cardiovascular;  Laterality: N/A;   RIGHT HEART CATH N/A 06/13/2021   Procedure: RIGHT HEART CATH;  Surgeon: Larey Dresser, MD;  Location: [place] CV LAB;  Service: Cardiovascular;  Laterality: N/A;   RIGHT HEART CATH N/A 03/06/2022   Procedure: RIGHT HEART CATH;  Surgeon: Larey Dresser, MD;  Location: [place] CV LAB;  Service: Cardiovascular;  Laterality: N/A;   SKIN CANCER DESTRUCTION  summer 2015   TEE WITHOUT CARDIOVERSION  11/10/2011   Procedure: TRANSESOPHAGEAL ECHOCARDIOGRAM (TEE);  Surgeon: Thayer Headings, MD;  Location: [facility];  Service: Cardiovascular;  Laterality: N/A;   TONSILLECTOMY  as child   US ECHOCARDIOGRAPHY  03-10-2008   Est EF 55-60%, Dr. Cathie Olden   VENTRAL HERNIA REPAIR N/A 11/24/2013   Procedure: LAPAROSCOPIC VENTRAL HERNIA;  Surgeon: Michael Boston, MD;  Location: [facility] ORS;  Service: General;  Laterality: N/A;   WRIST SURGERY Right ~2009   metal rod in place    SOCIAL HISTORY: Social History   Socioeconomic History   Marital status: Married    Spouse name: Not on file   Number of children: 2   Years of education: Not on file   Highest education level: Master's degree (e.g., MA, MS, MEng, MEd, MSW, MBA)  Occupational History   Not on file  Tobacco Use   Smoking status: Never   Smokeless tobacco: Never  Vaping Use   Vaping Use: Never used  Substance and Sexual Activity   Alcohol use: No   Drug use: No   Sexual activity: Yes    Partners: Male    Birth control/protection: Post-menopausal  Other Topics Concern   Not on file  Social History Narrative   Lives in [place].  Retired Licensed conveyancer.   Right handed    Social Determinants of Health   Financial Resource Strain: Not on file  Food Insecurity: Not on file  Transportation Needs: Not on file  Physical Activity: Not on file  Stress: Not on file  Social  Connections: Not on file  Intimate Partner Violence: Not on file    FAMILY HISTORY: Family History  Problem Relation Age of Onset   Alzheimer's disease Mother    Heart failure Father    Healthy Child    Healthy Child    Breast cancer Maternal Aunt     ALLERGIES:  is allergic to amiodarone and statins.  MEDICATIONS:  Current Outpatient Medications  Medication Sig Dispense Refill   acetaminophen (TYLENOL) 500 MG tablet Take 1,000 mg by mouth at bedtime as needed (pain.).     Acetylcysteine (NAC 600) 600 MG CAPS Take 600 mg by mouth in the morning.     apixaban (ELIQUIS) 5 MG TABS tablet Take 1 tablet (5 mg total) by  mouth 2 (two) times daily. 180 tablet 1   BELSOMRA 20 MG TABS Take 1 tablet by mouth at bedtime as needed (sleep).     Calcium Citrate-Vitamin D (CITRACAL + D PO) Take 1 tablet by mouth every evening.     carbidopa-levodopa (SINEMET CR) 50-200 MG tablet TAKE ONE TABLET BY MOUTH EVERYDAY AT BEDTIME 90 tablet 0   carbidopa-levodopa (SINEMET IR) 25-100 MG tablet TAKE 2 TABLETS BY MOUTH 3 TIMES DAILY. 540 tablet 1   cholecalciferol (VITAMIN D3) 25 MCG (1000 UNIT) tablet Take 1,000 Units by mouth in the morning.     cyanocobalamin (,VITAMIN B-12,) 1000 MCG/ML injection INJECT 1ML INTRAMUSCULARLY IN THE DELTOID ONCE A MONTH (ALTERNATING DELT. EACH MONTH)     Cyanocobalamin (VITAMIN B12) 1000 MCG TBCR Take 1,000 mcg by mouth in the morning.     docusate sodium (COLACE) 100 MG capsule Take 100 mg by mouth in the morning and at bedtime.     GLUTATHIONE PO Take 2 capsules by mouth in the morning.     levothyroxine (SYNTHROID) 50 MCG tablet Take 50-75 mcg by mouth See admin instructions. Take 2 tablets (100 mcg) by mouth on Mondays and Thursdays in the morning. Take 1 tablet (50 mcg) by mouth on Sundays, Tuesdays, Wednesdays, Fridays & Saturdays.     magnesium oxide (MAG-OX) 400 MG tablet Take 400 mg by mouth at bedtime.     metolazone (ZAROXOLYN) 2.5 MG tablet Take 1 tablet 20  minutes before Torsemide on Sat 1/20 and Sun 1/21 only. 2 tablet 1   metoprolol succinate (TOPROL-XL) 25 MG 24 hr tablet TAKE 1 TABLET BY MOUTH EVERY DAY (Patient taking differently: Take 25 mg by mouth every evening. TAKE 1 TABLET BY MOUTH EVERY DAY) 90 tablet 3   multivitamin-lutein (OCUVITE-LUTEIN) CAPS capsule Take 1 capsule by mouth in the morning.     NON FORMULARY Take 1 tablet by mouth at bedtime. MetaCalm Stress Regulation Formula     polycarbophil (FIBERCON) 625 MG tablet Take 1,250 mg by mouth in the morning.     potassium chloride (MICRO-K) 10 MEQ CR capsule Take 6 capsules (60 mEq total) by mouth 2 (two) times daily. 240 capsule 3   Probiotic Product (PROBIOTIC PO) Take 1 capsule by mouth every evening.     Propylene Glycol (SYSTANE BALANCE) 0.6 % SOLN Place 1 drop into both eyes 2 (two) times daily as needed (dry/irritated eyes.).     rasagiline (AZILECT) 1 MG TABS tablet TAKE 1 TABLET BY MOUTH EVERY DAY 90 tablet 0   thiamine (VITAMIN B-1) 100 MG tablet Take 100 mg by mouth in the morning.     torsemide (DEMADEX) 20 MG tablet Take 3 tablets (60 mg total) by mouth daily with breakfast AND 2 tablets (40 mg total) every evening. 180 tablet 3   valACYclovir (VALTREX) 1000 MG tablet Take 1,000 mg by mouth 2 (two) times daily as needed (fever blisters).     vitamin C (ASCORBIC ACID) 500 MG tablet Take 500 mg by mouth in the morning.     No current facility-administered medications for this visit.    REVIEW OF SYSTEMS:   Constitutional: Denies fevers, chills or abnormal night sweats All other systems were reviewed with the patient and are negative.  PHYSICAL EXAMINATION: ECOG PERFORMANCE STATUS: 1 - Symptomatic but completely ambulatory  Vitals:   03/10/22 0851  BP: 98/60  Pulse: 86  Resp: 16  Temp: (!) 97.3 F (36.3 C)  SpO2: 100%   Filed Weights  03/10/22 0851  Weight: 127 lb 8 oz (57.8 kg)    GENERAL:alert, no distress and comfortable  LABORATORY DATA:  I have  reviewed the data as listed Lab Results  Component Value Date   WBC 4.6 03/06/2022   HGB 8.8 (L) 03/06/2022   HCT 26.0 (L) 03/06/2022   MCV 96.0 03/06/2022   PLT 197 03/06/2022   Lab Results  Component Value Date   NA 136 03/06/2022   K 3.7 03/06/2022   CL 89 (L) 03/06/2022   CO2 34 (H) 03/06/2022    RADIOGRAPHIC STUDIES: I have personally reviewed the radiological reports and agreed with the findings in the report.  ASSESSMENT AND PLAN:  Normocytic anemia Lab review: 06/11/2021: Hemoglobin 10.3, MCV 88.2 02/21/2022: Hemoglobin 8.3, MCV 101, ferritin 88, creatinine 1.08, iron saturation 13%, TIBC 406 03/06/2022: Hemoglobin 8.7, MCV 96, RDW 15.9  Cord stem cell infusion November 2023  Differential diagnosis: 1. Anemia due to chronic disease and inflammation 2. anemia due to renal dysfunction 3. Combined B-12 and folate deficiency along with iron deficiency 4. Hemolysis 5. Hypothyroidism 6. Plasma cell disorders myeloma 7. Bone marrow dysfunction with MDS  Workup ordered: 1. CBC with differential to evaluate the smear 2. Haptoglobin, LDH, reticulocyte count to evaluate hemolysis 3. TSH 4. SPEP 5. Q-25 and folic acid levels 6.  Erythropoietin level  The above testing is negative then we will consider doing a bone marrow biopsy. Return to clinic in 2 weeks to discuss results of the labs and follow-up All questions were answered. The patient knows to call the clinic with any problems, questions or concerns.    Harriette Ohara, MD 03/10/22

## 2022-03-11 ENCOUNTER — Inpatient Hospital Stay: Payer: Medicare Other

## 2022-03-11 DIAGNOSIS — Z79899 Other long term (current) drug therapy: Secondary | ICD-10-CM | POA: Diagnosis not present

## 2022-03-11 DIAGNOSIS — Z803 Family history of malignant neoplasm of breast: Secondary | ICD-10-CM | POA: Diagnosis not present

## 2022-03-11 DIAGNOSIS — R188 Other ascites: Secondary | ICD-10-CM | POA: Diagnosis not present

## 2022-03-11 DIAGNOSIS — R195 Other fecal abnormalities: Secondary | ICD-10-CM | POA: Diagnosis not present

## 2022-03-11 DIAGNOSIS — K7469 Other cirrhosis of liver: Secondary | ICD-10-CM | POA: Diagnosis not present

## 2022-03-11 DIAGNOSIS — D649 Anemia, unspecified: Secondary | ICD-10-CM | POA: Diagnosis not present

## 2022-03-11 DIAGNOSIS — Z79620 Long term (current) use of immunosuppressive biologic: Secondary | ICD-10-CM | POA: Diagnosis not present

## 2022-03-11 DIAGNOSIS — J90 Pleural effusion, not elsewhere classified: Secondary | ICD-10-CM | POA: Diagnosis not present

## 2022-03-11 LAB — CMP (CANCER CENTER ONLY)
ALT: <5 U/L (ref 0–44)
AST: 19 U/L (ref 15–41)
Albumin: 4.1 g/dL (ref 3.5–5.0)
Alkaline Phosphatase: 88 U/L (ref 38–126)
Anion gap: 6 (ref 5–15)
BUN: 22 mg/dL (ref 8–23)
CO2: 33 mmol/L — ABNORMAL HIGH (ref 22–32)
Calcium: 9.9 mg/dL (ref 8.9–10.3)
Chloride: 99 mmol/L (ref 98–111)
Creatinine: 0.85 mg/dL (ref 0.44–1.00)
GFR, Estimated: >60 mL/min (ref >60)
Glucose, Bld: 79 mg/dL (ref 70–99)
Potassium: 4.1 mmol/L (ref 3.5–5.1)
Sodium: 138 mmol/L (ref 135–145)
Total Bilirubin: 1.5 mg/dL — ABNORMAL HIGH (ref 0.3–1.2)
Total Protein: 6.9 g/dL (ref 6.5–8.1)

## 2022-03-11 LAB — CBC WITH DIFFERENTIAL (CANCER CENTER ONLY)
Abs Immature Granulocytes: 0.02 10*3/uL (ref 0.00–0.07)
Basophils Absolute: 0.1 10*3/uL (ref 0.0–0.1)
Basophils Relative: 1 %
Eosinophils Absolute: 0.1 10*3/uL (ref 0.0–0.5)
Eosinophils Relative: 2 %
HCT: 29.9 % — ABNORMAL LOW (ref 36.0–46.0)
Hemoglobin: 9.5 g/dL — ABNORMAL LOW (ref 12.0–15.0)
Immature Granulocytes: 0 %
Lymphocytes Relative: 12 %
Lymphs Abs: 0.5 10*3/uL — ABNORMAL LOW (ref 0.7–4.0)
MCH: 30.4 pg (ref 26.0–34.0)
MCHC: 31.8 g/dL (ref 30.0–36.0)
MCV: 95.8 fL (ref 80.0–100.0)
Monocytes Absolute: 0.6 10*3/uL (ref 0.1–1.0)
Monocytes Relative: 12 %
Neutro Abs: 3.4 10*3/uL (ref 1.7–7.7)
Neutrophils Relative %: 73 %
Platelet Count: 227 10*3/uL (ref 150–400)
RBC: 3.12 MIL/uL — ABNORMAL LOW (ref 3.87–5.11)
RDW: 15.9 % — ABNORMAL HIGH (ref 11.5–15.5)
WBC Count: 4.7 10*3/uL (ref 4.0–10.5)
nRBC: 0 % (ref 0.0–0.2)

## 2022-03-11 LAB — DIRECT ANTIGLOBULIN TEST (NOT AT ARMC)
DAT, IgG: NEGATIVE
DAT, complement: NEGATIVE

## 2022-03-11 LAB — RETIC PANEL
Immature Retic Fract: 24.8 % — ABNORMAL HIGH (ref 2.3–15.9)
RBC.: 3.06 MIL/uL — ABNORMAL LOW (ref 3.87–5.11)
Retic Count, Absolute: 71.3 10*3/uL (ref 19.0–186.0)
Retic Ct Pct: 2.3 % (ref 0.4–3.1)
Reticulocyte Hemoglobin: 30.6 pg (ref >27.9)

## 2022-03-11 LAB — LACTATE DEHYDROGENASE: LDH: 232 U/L — ABNORMAL HIGH (ref 98–192)

## 2022-03-11 LAB — FOLATE: Folate: 15.9 ng/mL (ref >5.9)

## 2022-03-11 LAB — VITAMIN B12: Vitamin B-12: 690 pg/mL (ref 180–914)

## 2022-03-11 NOTE — Progress Notes (Signed)
PCP: Crist Infante, MD EP: Dr. Curt Bears HF Cardiology: Dr. Aundra Dubin  82 y.o. 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  Patient had an echo done in 3/23 showing EF 65-70%, mild LVH, normal RV, PASP 60 mmHg, moderate biatrial enlargement, mild MR, moderate TR. Prior echoes had shown mildly elevated PA pressure. Cardiolite in 10/21 showed no ischemia.   RHC was done in 5/23 showing mild pulmonary venous hypertension with mildly elevated PCWP. She was started on Farxiga.  She developed a persistent UTI that was difficult to clear and stopped [place].   Today she returns for HF followup as an urgent work-in due to dyspnea. She reports a mechanical fall on 1/24 (tripped ).  Since that time, she reports progressively worsening exertional dyspnea.  She called in shortly after that to our office and was told to take Lasix 80 qam/40 qpm for 3 days then back to 80 daily.  This did not help.  She has been short of breath walking short distances, had some dyspnea walking into the office today.  Weight up 13 lbs.  She has noted fullness in her chest when sleeping and has had to prop up more (orthopnea).  No lightheadedness, no chest pain.  She does not feel palpitations.    CTA chest was done yesterday showing no PE, moderate right pleural effusion, mild pulmonary edema.   ECG (personally reviewed): A-V dual pacing  Medtronic device interrogation: 14.6% atrial fibrillation, more than in the past.   Labs (3/23): hgb 9.7 Labs (4/23): K 3.2, creatinine 0.92 Labs (6/23): creatinine 0.8  Labs (11/23): K 3.5, creatinine 0.7  PMH: 1. Atrial fibrillation: Paroxysmal.  S/p AF (epicardial and endocardial) ablation in 10/13.  2. Tachy-brady syndrome: Medtronic PPM placed in 9/14.  3. H/o atypical atrial flutter 4. Hyperlipidemia 5. HTN 6.  Hypothyroidism 7. Parkinsons disease: Followed by Dr. Carles Collet 8. Cirrhosis: ?NAFLD. No ETOH.  9. Chronic diastolic CHF/pulmonary hypertension:  - LHC (2014): No significant CAD.  - Cardiolite (10/21): EF 71%, no ischemia.  - Echo (3/23): EF 65-70%, mild LVH, normal RV, PASP 60 mmHg, moderate biatrial enlargement, mild MR, moderate TR.  - RHC (5/23): mean RA 8, PA 49/13 mean 30, mean PCWP 19 with v-waves to 30, CI 3.96, PVR 1.8 WU.  10. B12 deficiency.   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   Right handed    Social Determinants of Health   Financial Resource Strain: Not on file  Food Insecurity: Not on file  Transportation Needs: Not on file  Physical Activity: Not on file  Stress: Not on file  Social Connections: Not on file  Intimate Partner Violence: Not on  file   Family History  Problem Relation Age of Onset   Alzheimer's disease Mother    Heart failure Father    Healthy Child    Healthy Child    Breast cancer Maternal Aunt    ROS: All systems reviewed and negative except as per HPI.   Current Outpatient Medications  Medication Sig Dispense Refill   acetaminophen (TYLENOL) 500 MG tablet Take 1,000 mg by mouth at bedtime as needed (pain.).     Acetylcysteine (NAC 600) 600 MG CAPS Take 600 mg by mouth in the morning.     apixaban (ELIQUIS) 5 MG TABS tablet Take 1 tablet (5 mg total) by  mouth 2 (two) times daily. 180 tablet 1   BELSOMRA 20 MG TABS Take 1 tablet by mouth at bedtime as needed (sleep).     Calcium Citrate-Vitamin D (CITRACAL + D PO) Take 1 tablet by mouth every evening.     carbidopa-levodopa (SINEMET CR) 50-200 MG tablet TAKE ONE TABLET BY MOUTH EVERYDAY AT BEDTIME 90 tablet 0   carbidopa-levodopa (SINEMET IR) 25-100 MG tablet TAKE 2 TABLETS BY MOUTH 3 TIMES DAILY. 540 tablet 1   cholecalciferol (VITAMIN D3) 25 MCG (1000 UNIT) tablet Take 1,000 Units by mouth in the morning.     cyanocobalamin (,VITAMIN B-12,) 1000 MCG/ML injection INJECT 1ML INTRAMUSCULARLY IN THE DELTOID ONCE A MONTH (ALTERNATING DELT. EACH MONTH)     Cyanocobalamin (VITAMIN B12) 1000 MCG TBCR Take 1,000 mcg by mouth in the morning.     docusate sodium (COLACE) 100 MG capsule Take 100 mg by mouth in the morning and at bedtime.     GLUTATHIONE PO Take 2 capsules by mouth in the morning.     levothyroxine (SYNTHROID) 50 MCG tablet Take 50-75 mcg by mouth See admin instructions. Take 2 tablets (100 mcg) by mouth on Mondays and Thursdays in the morning. Take 1 tablet (50 mcg) by mouth on Sundays, Tuesdays, Wednesdays, Fridays & Saturdays.     magnesium oxide (MAG-OX) 400 MG tablet Take 400 mg by mouth at bedtime.     metolazone (ZAROXOLYN) 2.5 MG tablet Take 1 tablet 20 minutes before Torsemide on Sat 1/20 and Sun 1/21 only. 2 tablet 1   metoprolol succinate (TOPROL-XL) 25 MG 24 hr tablet TAKE 1 TABLET BY MOUTH EVERY DAY (Patient taking differently: Take 25 mg by mouth every evening. TAKE 1 TABLET BY MOUTH EVERY DAY) 90 tablet 3   multivitamin-lutein (OCUVITE-LUTEIN) CAPS capsule Take 1 capsule by mouth in the morning.     NON FORMULARY Take 1 tablet by mouth at bedtime. MetaCalm Stress Regulation Formula     polycarbophil (FIBERCON) 625 MG tablet Take 1,250 mg by mouth in the morning.     potassium chloride (MICRO-K) 10 MEQ CR capsule Take 6 capsules (60 mEq total) by mouth 2 (two) times daily. 240  capsule 3   Probiotic Product (PROBIOTIC PO) Take 1 capsule by mouth every evening.     Propylene Glycol (SYSTANE BALANCE) 0.6 % SOLN Place 1 drop into both eyes 2 (two) times daily as needed (dry/irritated eyes.).     rasagiline (AZILECT) 1 MG TABS tablet TAKE 1 TABLET BY MOUTH EVERY DAY 90 tablet 0   thiamine (VITAMIN B-1) 100 MG tablet Take 100 mg by mouth in the morning.     torsemide (DEMADEX) 20 MG tablet Take 3 tablets (60 mg total) by mouth daily with breakfast AND 2 tablets (40 mg total) every evening. 180 tablet 3   valACYclovir (VALTREX)  1000 MG tablet Take 1,000 mg by mouth 2 (two) times daily as needed (fever blisters).     vitamin C (ASCORBIC ACID) 500 MG tablet Take 500 mg by mouth in the morning.     No current facility-administered medications for this visit.   LMP 02/04/1996  There were no vitals filed for this visit.  Wt Readings from Last 3 Encounters:  03/10/22 57.8 kg (127 lb 8 oz)  03/06/22 58.1 kg (128 lb)  02/20/22 64.2 kg (141 lb 9.6 oz)   General: NAD Neck: JVP 12 cm, no thyromegaly or thyroid nodule.  Lungs: Decreased BS right base.  CV: Nondisplaced PMI.  Heart regular S1/S2, no S3/S4, no murmur.  1+ edema 1/2 to knees bilaterally.  No carotid bruit.  Normal pedal pulses.  Abdomen: Soft, nontender, no hepatosplenomegaly, mild distention.  Skin: Intact without lesions or rashes.  Neurologic: Alert and oriented x 3.  Psych: Normal affect. Extremities: No clubbing or cyanosis.  HEENT: Normal.   Assessment/Plan: 1. Pulmonary hypertension: Patient was noted on 3/23 echo to have moderately elevated PA pressure (60 mmHg).  Prior echoes had shown mildly elevated PA pressure.  RV was normal on this echo, there was moderate TR.  RHC in 5/23 showed mild pulmonary venous hypertension (group 2) with mildly elevated PCWP.  Diastolic LV dysfunction with elevated LA pressure (pulmonary venous hypertension) is the most likely etiology.  2. Acute on chronic diastolic CHF:  Echo in 1/57 with EF 65-70%, mild LVH, normal RV, PASP 60 mmHg, moderate biatrial enlargement, mild MR, moderate TR. RHC in 5/23 showed elevated PCWP with prominent v-waves.  Echo from 3/23 reviewed, no more than mild MR so suspect elevated v-waves due to diastolic dysfunction.  She had pulmonary venous hypertension.  She has had worsening symptoms over the last 2 weeks despite transiently increasing her Lasix.  She has gained 13 lbs and is volume overloaded on exam.  NYHA class III symptoms.  - I will arrange for repeat echo, make sure EF is not declining with chronic RV pacing though EF was normal in 3/23.  - Stop po Lasix.  Lasix 80 mg IV x 1 today.  Tomorrow and Saturday, will have her take Lasix [place] 80 mg IV daily ([name]).  Starting Sunday she will begin torsemide 60 mg daily.  Can increase as needed.   - KCl 20 bid.  - BMET/BNP today and again next week.  - Off Farxiga due to persistent UTI. Suspect she has UTI today.  - Will add spironolactone next.  - Next week, would like to bring her back for RHC to assess filling pressures and PA pressure.  3. Atrial fibrillation: Paroxysmal. She is in NSR today. She has periodic AF by device interrogation, higher burden over the last few weeks.  She has had an epicardial and endocardial ablation in the past.  She failed Tikosyn in the past and did not tolerate amiodarone due to side effects. Query whether increased AF burden triggered her CHF or is a product of worsening volume retention.   - Continue Eliquis.  - Continue Toprol XL.  - Will have her seen by Dr. Curt Bears when volume is improved to consider redo ablation. There is not a good anti-arrhythmic option for her.  4. Tachy-brady syndrome: Has MDT PPM. Had gen change 07/17/21 5. Cirrhosis: ?NAFLD.  Does not drink any ETOH now, was not a heavy drinker before.  - Followed by [facility].  6. Nurse was cleaning up after patient left, suspects UTI  based on appearance/smell of urine. Will ask PCP to  consider treatment.   Followup 2 wks with APP.   [place]  03/11/2022

## 2022-03-12 ENCOUNTER — Ambulatory Visit (HOSPITAL_COMMUNITY)
Admission: RE | Admit: 2022-03-12 | Discharge: 2022-03-12 | Disposition: A | Discharge status: Home / Self Care | Payer: Medicare Other | Source: Ambulatory Visit | Attending: Family Medicine | Admitting: Family Medicine

## 2022-03-12 ENCOUNTER — Encounter (HOSPITAL_COMMUNITY): Payer: Self-pay

## 2022-03-12 VITALS — BP 110/70 | HR 73 | Wt 128.0 lb

## 2022-03-12 DIAGNOSIS — K7469 Other cirrhosis of liver: Secondary | ICD-10-CM | POA: Diagnosis not present

## 2022-03-12 DIAGNOSIS — Z79899 Other long term (current) drug therapy: Secondary | ICD-10-CM | POA: Insufficient documentation

## 2022-03-12 DIAGNOSIS — I5032 Chronic diastolic (congestive) heart failure: Secondary | ICD-10-CM | POA: Insufficient documentation

## 2022-03-12 DIAGNOSIS — I4892 Unspecified atrial flutter: Secondary | ICD-10-CM | POA: Insufficient documentation

## 2022-03-12 DIAGNOSIS — I110 Hypertensive heart disease with heart failure: Secondary | ICD-10-CM | POA: Diagnosis not present

## 2022-03-12 DIAGNOSIS — I272 Pulmonary hypertension, unspecified: Secondary | ICD-10-CM | POA: Insufficient documentation

## 2022-03-12 DIAGNOSIS — N390 Urinary tract infection, site not specified: Secondary | ICD-10-CM | POA: Diagnosis not present

## 2022-03-12 DIAGNOSIS — Z7901 Long term (current) use of anticoagulants: Secondary | ICD-10-CM | POA: Insufficient documentation

## 2022-03-12 DIAGNOSIS — K760 Fatty (change of) liver, not elsewhere classified: Secondary | ICD-10-CM | POA: Diagnosis not present

## 2022-03-12 DIAGNOSIS — I4891 Unspecified atrial fibrillation: Secondary | ICD-10-CM

## 2022-03-12 DIAGNOSIS — E785 Hyperlipidemia, unspecified: Secondary | ICD-10-CM | POA: Diagnosis not present

## 2022-03-12 DIAGNOSIS — I480 Paroxysmal atrial fibrillation: Secondary | ICD-10-CM | POA: Diagnosis not present

## 2022-03-12 DIAGNOSIS — K7460 Unspecified cirrhosis of liver: Secondary | ICD-10-CM | POA: Diagnosis not present

## 2022-03-12 DIAGNOSIS — I081 Rheumatic disorders of both mitral and tricuspid valves: Secondary | ICD-10-CM | POA: Diagnosis not present

## 2022-03-12 DIAGNOSIS — I495 Sick sinus syndrome: Secondary | ICD-10-CM | POA: Insufficient documentation

## 2022-03-12 DIAGNOSIS — G20A1 Parkinson's disease without dyskinesia, without mention of fluctuations: Secondary | ICD-10-CM | POA: Insufficient documentation

## 2022-03-12 DIAGNOSIS — E039 Hypothyroidism, unspecified: Secondary | ICD-10-CM | POA: Insufficient documentation

## 2022-03-12 LAB — ERYTHROPOIETIN: Erythropoietin: 39.2 m[IU]/mL — ABNORMAL HIGH (ref 2.6–18.5)

## 2022-03-12 MED ORDER — POTASSIUM CHLORIDE CRYS ER 20 MEQ PO TBCR: 40.0000 meq | EXTENDED_RELEASE_TABLET | Freq: Two times a day (BID) | ORAL | Status: DC

## 2022-03-12 MED ORDER — SPIRONOLACTONE 25 MG PO TABS: 12.5000 mg | ORAL_TABLET | Freq: Every day | ORAL | 6 refills | Status: DC

## 2022-03-12 NOTE — Patient Instructions (Signed)
Medication Changes:  START. Spironolactone 12.'5mg'$ . Take 0.5 tablet, once daily.   CHANGE: potassium chloride. Take 58mq (2 tablets), two times a day.   Lab Work:  You have a lab draw appointment for 2/14 @ 8:45am  Referrals:  You have been referred back to Dr. CCurt Bearswith Cardiac Electrophysiology for consideration of a re-do AFib ablation. If they do not call you within 2 weeks please call their office for an update at [phone] Special Instructions // Education:  Do the following things EVERYDAY: Weigh yourself in the morning before breakfast. Write it down and keep it in a log. Take your medicines as prescribed Eat low salt foods--Limit salt (sodium) to 2000 mg per day.  Stay as active as you can everyday Limit all fluids for the day to less than 2 liters  Follow-Up in: Please call our office in MAY to set up your follow up appointment with Dr. MAundra Dubinfor JChatham      [facility] you and your health needs are our priority. We have a designated team specialized in the treatment of Heart Failure. This Care Team includes your primary Heart Failure Specialized Cardiologist (physician), Advanced Practice Providers (APPs- Physician Assistants and Nurse Practitioners), and Pharmacist who all work together to provide you with the care you need, when you need it.   You may see any of the following providers on your designated Care Team at your next follow up:  Dr. DGlori BickersDr. DLoralie ChampagneDr. ARoxana Hires NP BLyda Jester PUtahJ[facility] LLCLGurley PUtahAForestine Na NP LAudry Riles PharmD   Please be sure to bring in all your medications bottles to every appointment.   Need to Contact [place]  If you have any questions or concerns before your next appointment please send [place] message through [place]or call our office at [phone]    TO LEAVE A MESSAGE FOR THE NURSE SELECT OPTION 2, PLEASE LEAVE A MESSAGE  INCLUDING: YOUR NAME DATE OF BIRTH CALL BACK NUMBER REASON FOR CALL**this is important as we prioritize the call backs  YOU WILL RECEIVE A CALL BACK THE SAME DAY AS LONG AS YOU CALL BEFORE 4:00 PM

## 2022-03-13 ENCOUNTER — Ambulatory Visit (HOSPITAL_BASED_OUTPATIENT_CLINIC_OR_DEPARTMENT_OTHER)
Admission: RE | Admit: 2022-03-13 | Discharge: 2022-03-13 | Disposition: A | Discharge status: Home / Self Care | Payer: Medicare Other | Source: Ambulatory Visit | Attending: Family Medicine | Admitting: Family Medicine

## 2022-03-13 ENCOUNTER — Telehealth (HOSPITAL_COMMUNITY): Payer: Self-pay

## 2022-03-13 DIAGNOSIS — I4892 Unspecified atrial flutter: Secondary | ICD-10-CM | POA: Diagnosis not present

## 2022-03-13 DIAGNOSIS — I5032 Chronic diastolic (congestive) heart failure: Secondary | ICD-10-CM | POA: Diagnosis not present

## 2022-03-13 DIAGNOSIS — I480 Paroxysmal atrial fibrillation: Secondary | ICD-10-CM | POA: Diagnosis not present

## 2022-03-13 DIAGNOSIS — I110 Hypertensive heart disease with heart failure: Secondary | ICD-10-CM | POA: Diagnosis not present

## 2022-03-13 DIAGNOSIS — I495 Sick sinus syndrome: Secondary | ICD-10-CM | POA: Diagnosis not present

## 2022-03-13 DIAGNOSIS — I081 Rheumatic disorders of both mitral and tricuspid valves: Secondary | ICD-10-CM | POA: Diagnosis not present

## 2022-03-13 LAB — ECHOCARDIOGRAM COMPLETE
AR max vel: 1.65 cm2
AV Area VTI: 1.69 cm2
AV Area mean vel: 1.67 cm2
AV Mean grad: 4 mmHg
AV Peak grad: 7.5 mmHg
Ao pk vel: 1.37 m/s
Area-P 1/2: 3.45 cm2
S' Lateral: 2.1 cm

## 2022-03-13 NOTE — Telephone Encounter (Signed)
-----   Message from Scarlette Calico, RN sent at 03/11/2022  9:43 AM EST ----- Hematology is doing a work up so we don't need to worry about ordering iron for now ----- Message ----- From: Shonna Chock, CMA Sent: 1/61/0960   2:52 PM EST To: Scarlette Calico, RN  Can you put the iron orders in they wont let me schedule it without it being put in ----- Message ----- From: Kerry Dory, CMA Sent: 02/28/2022   4:54 AM EST To: Mcgregor Tinnon R Temima Kutsch, CMA  IV iron needed Im sending again in case I did not send the other day

## 2022-03-15 LAB — THYROID PANEL WITH TSH
Free Thyroxine Index: 2.6 (ref 1.2–4.9)
T3 Uptake Ratio: 29 % (ref 24–39)
T4, Total: 9 ug/dL (ref 4.5–12.0)
TSH: 4.1 u[IU]/mL (ref 0.450–4.500)

## 2022-03-16 ENCOUNTER — Other Ambulatory Visit: Payer: Self-pay | Admitting: Neurology

## 2022-03-16 DIAGNOSIS — G20A1 Parkinson's disease without dyskinesia, without mention of fluctuations: Secondary | ICD-10-CM

## 2022-03-16 DIAGNOSIS — G20B1 Parkinson's disease with dyskinesia, without mention of fluctuations: Secondary | ICD-10-CM

## 2022-03-18 LAB — MULTIPLE MYELOMA PANEL, SERUM
Albumin SerPl Elph-Mcnc: 3.8 g/dL (ref 2.9–4.4)
Albumin/Glob SerPl: 1.4 (ref 0.7–1.7)
Alpha 1: 0.3 g/dL (ref 0.0–0.4)
Alpha2 Glob SerPl Elph-Mcnc: 0.7 g/dL (ref 0.4–1.0)
B-Globulin SerPl Elph-Mcnc: 0.8 g/dL (ref 0.7–1.3)
Gamma Glob SerPl Elph-Mcnc: 1.1 g/dL (ref 0.4–1.8)
Globulin, Total: 2.9 g/dL (ref 2.2–3.9)
IgA: 166 mg/dL (ref 64–422)
IgG (Immunoglobin G), Serum: 1223 mg/dL (ref 586–1602)
IgM (Immunoglobulin M), Srm: 44 mg/dL (ref 26–217)
Total Protein ELP: 6.7 g/dL (ref 6.0–8.5)

## 2022-03-19 ENCOUNTER — Ambulatory Visit (HOSPITAL_COMMUNITY)
Admission: RE | Admit: 2022-03-19 | Discharge: 2022-03-19 | Disposition: A | Discharge status: Home / Self Care | Payer: Medicare Other | Source: Ambulatory Visit | Attending: Internal Medicine | Admitting: Internal Medicine

## 2022-03-19 DIAGNOSIS — I5032 Chronic diastolic (congestive) heart failure: Secondary | ICD-10-CM | POA: Diagnosis not present

## 2022-03-19 LAB — BASIC METABOLIC PANEL
Anion gap: 9 (ref 5–15)
BUN: 23 mg/dL (ref 8–23)
CO2: 29 mmol/L (ref 22–32)
Calcium: 9.5 mg/dL (ref 8.9–10.3)
Chloride: 98 mmol/L (ref 98–111)
Creatinine, Ser: 0.95 mg/dL (ref 0.44–1.00)
GFR, Estimated: >60 mL/min (ref >60)
Glucose, Bld: 106 mg/dL — ABNORMAL HIGH (ref 70–99)
Potassium: 3.7 mmol/L (ref 3.5–5.1)
Sodium: 136 mmol/L (ref 135–145)

## 2022-03-24 DIAGNOSIS — R399 Unspecified symptoms and signs involving the genitourinary system: Secondary | ICD-10-CM | POA: Diagnosis not present

## 2022-03-24 NOTE — Assessment & Plan Note (Signed)
Lab review: 06/11/2021: Hemoglobin 10.3, MCV 88.2 02/21/2022: Hemoglobin 8.3, MCV 101, ferritin 88, creatinine 1.08, iron saturation 13%, TIBC 406 03/06/2022: Hemoglobin 8.7, MCV 96, RDW 15.9   Cord stem cell infusion November 2023  Work-up: SPEP: No M Protein LDH 232 (was 535) Folate: 15.9, TSH 4.1, DAT: Neg, Retic 2.3%, Imm Retic Frac: 24.8, BR 1.5, B 12: 690, EPO: 39.2, Hb 9.5, Cr 0.85  No clear cut etiology. Recommend: Bone marrow biopsy

## 2022-03-24 NOTE — Progress Notes (Signed)
Patient Care Team: Crist Infante, MD as PCP - General (Internal Medicine) Nahser, Wonda Cheng, MD as PCP - Cardiology (Cardiology) Constance Haw, MD as PCP - Electrophysiology (Cardiology) Carney Bern, MD as Referring Physician (Cardiothoracic Surgery) Geanie Cooley, MD as Consulting Physician (Internal Medicine) Tat, Eustace Quail, DO as Consulting Physician (Neurology)  DIAGNOSIS: No diagnosis found.  SUMMARY OF ONCOLOGIC HISTORY: Oncology History   No history exists.    CHIEF COMPLIANT:   INTERVAL HISTORY: [name] [name] is a   ALLERGIES:  is allergic to amiodarone and statins.  MEDICATIONS:  Current Outpatient Medications  Medication Sig Dispense Refill   acetaminophen (TYLENOL) 500 MG tablet Take 1,000 mg by mouth at bedtime as needed (pain.).     Acetylcysteine (NAC 600) 600 MG CAPS Take 600 mg by mouth in the morning.     apixaban (ELIQUIS) 5 MG TABS tablet Take 1 tablet (5 mg total) by mouth 2 (two) times daily. 180 tablet 1   BELSOMRA 20 MG TABS Take 1 tablet by mouth at bedtime as needed (sleep).     Calcium Citrate-Vitamin D (CITRACAL + D PO) Take 1 tablet by mouth every evening.     carbidopa-levodopa (SINEMET CR) 50-200 MG tablet TAKE ONE TABLET BY MOUTH EVERYDAY AT BEDTIME 90 tablet 0   carbidopa-levodopa (SINEMET IR) 25-100 MG tablet TAKE 2 TABLETS BY MOUTH 3 TIMES DAILY. 540 tablet 1   cholecalciferol (VITAMIN D3) 25 MCG (1000 UNIT) tablet Take 1,000 Units by mouth in the morning.     cyanocobalamin (,VITAMIN B-12,) 1000 MCG/ML injection INJECT 1ML INTRAMUSCULARLY IN THE DELTOID ONCE A MONTH (ALTERNATING DELT. EACH MONTH)     Cyanocobalamin (VITAMIN B12) 1000 MCG TBCR Take 1,000 mcg by mouth in the morning.     docusate sodium (COLACE) 100 MG capsule Take 100 mg by mouth in the morning and at bedtime.     GLUTATHIONE PO Take 2 capsules by mouth in the morning.     levothyroxine (SYNTHROID) 50 MCG tablet Take 50-75 mcg by mouth See admin  instructions. Take 2 tablets (100 mcg) by mouth on Mondays and Thursdays in the morning. Take 1 tablet (50 mcg) by mouth on Sundays, Tuesdays, Wednesdays, Fridays & Saturdays.     magnesium oxide (MAG-OX) 400 MG tablet Take 400 mg by mouth at bedtime.     metolazone (ZAROXOLYN) 2.5 MG tablet Take 1 tablet 20 minutes before Torsemide on Sat 1/20 and Sun 1/21 only. 2 tablet 1   metoprolol succinate (TOPROL-XL) 25 MG 24 hr tablet TAKE 1 TABLET BY MOUTH EVERY DAY (Patient taking differently: Take 25 mg by mouth every evening. TAKE 1 TABLET BY MOUTH EVERY DAY) 90 tablet 3   multivitamin-lutein (OCUVITE-LUTEIN) CAPS capsule Take 1 capsule by mouth in the morning.     NON FORMULARY Take 1 tablet by mouth at bedtime. MetaCalm Stress Regulation Formula     polycarbophil (FIBERCON) 625 MG tablet Take 1,250 mg by mouth in the morning.     potassium chloride (MICRO-K) 10 MEQ CR capsule Take 6 capsules (60 mEq total) by mouth 2 (two) times daily. 240 capsule 3   potassium chloride SA (KLOR-CON M) 20 MEQ tablet Take 2 tablets (40 mEq total) by mouth 2 (two) times daily.     Probiotic Product (PROBIOTIC PO) Take 1 capsule by mouth every evening.     Propylene Glycol (SYSTANE BALANCE) 0.6 % SOLN Place 1 drop into both eyes 2 (two) times daily as needed (dry/irritated eyes.).  rasagiline (AZILECT) 1 MG TABS tablet TAKE 1 TABLET BY MOUTH EVERY DAY 90 tablet 0   spironolactone (ALDACTONE) 25 MG tablet Take 0.5 tablets (12.5 mg total) by mouth daily. 15 tablet 6   thiamine (VITAMIN B-1) 100 MG tablet Take 100 mg by mouth in the morning.     torsemide (DEMADEX) 20 MG tablet Take 3 tablets (60 mg total) by mouth daily with breakfast AND 2 tablets (40 mg total) every evening. 180 tablet 3   valACYclovir (VALTREX) 1000 MG tablet Take 1,000 mg by mouth 2 (two) times daily as needed (fever blisters).     vitamin C (ASCORBIC ACID) 500 MG tablet Take 500 mg by mouth in the morning.     No current facility-administered  medications for this visit.    PHYSICAL EXAMINATION: ECOG PERFORMANCE STATUS: {CHL ONC ECOG PS:[id number]}  There were no vitals filed for this visit. There were no vitals filed for this visit.  BREAST:*** No palpable masses or nodules in either right or left breasts. No palpable axillary supraclavicular or infraclavicular adenopathy no breast tenderness or nipple discharge. (exam performed in the presence of a chaperone)  LABORATORY DATA:  I have reviewed the data as listed    Latest Ref Rng & Units 03/19/2022    8:44 AM 03/11/2022   10:07 AM 03/06/2022    8:08 AM  CMP  Glucose 70 - 99 mg/dL 106  79    BUN 8 - 23 mg/dL 23  22    Creatinine 0.44 - 1.00 mg/dL 0.95  0.85    Sodium 135 - 145 mmol/L 136  138  136   Potassium 3.5 - 5.1 mmol/L 3.7  4.1  3.7   Chloride 98 - 111 mmol/L 98  99    CO2 22 - 32 mmol/L 29  33    Calcium 8.9 - 10.3 mg/dL 9.5  9.9    Total Protein 6.5 - 8.1 g/dL  6.9    Total Bilirubin 0.3 - 1.2 mg/dL  1.5    Alkaline Phos 38 - 126 U/L  88    AST 15 - 41 U/L  19    ALT 0 - 44 U/L  <5      Lab Results  Component Value Date   WBC 4.7 03/11/2022   HGB 9.5 (L) 03/11/2022   HCT 29.9 (L) 03/11/2022   MCV 95.8 03/11/2022   PLT 227 03/11/2022   NEUTROABS 3.4 03/11/2022    ASSESSMENT & PLAN:  No problem-specific Assessment & Plan notes found for this encounter.    No orders of the defined types were placed in this encounter.  The patient has a good understanding of the overall plan. she agrees with it. she will call with any problems that may develop before the next visit here. Total time spent: 30 mins including face to face time and time spent for planning, charting and co-ordination of care   Suzzette Righter, [place] 03/24/22    I Gardiner Coins am acting as a Education administrator for [employer]  ***

## 2022-03-25 ENCOUNTER — Inpatient Hospital Stay (HOSPITAL_BASED_OUTPATIENT_CLINIC_OR_DEPARTMENT_OTHER): Payer: Medicare Other | Admitting: Hematology and Oncology

## 2022-03-25 ENCOUNTER — Other Ambulatory Visit: Payer: Self-pay | Admitting: Nurse Practitioner

## 2022-03-25 VITALS — BP 111/69 | HR 92 | Temp 97.5°F | Resp 18 | Wt 125.6 lb

## 2022-03-25 DIAGNOSIS — H353211 Exudative age-related macular degeneration, right eye, with active choroidal neovascularization: Secondary | ICD-10-CM | POA: Diagnosis not present

## 2022-03-25 DIAGNOSIS — H43812 Vitreous degeneration, left eye: Secondary | ICD-10-CM | POA: Diagnosis not present

## 2022-03-25 DIAGNOSIS — K7469 Other cirrhosis of liver: Secondary | ICD-10-CM

## 2022-03-25 DIAGNOSIS — D649 Anemia, unspecified: Secondary | ICD-10-CM | POA: Diagnosis not present

## 2022-03-25 DIAGNOSIS — R188 Other ascites: Secondary | ICD-10-CM

## 2022-03-25 DIAGNOSIS — R195 Other fecal abnormalities: Secondary | ICD-10-CM | POA: Diagnosis not present

## 2022-03-25 DIAGNOSIS — H35721 Serous detachment of retinal pigment epithelium, right eye: Secondary | ICD-10-CM | POA: Diagnosis not present

## 2022-03-25 DIAGNOSIS — Z803 Family history of malignant neoplasm of breast: Secondary | ICD-10-CM | POA: Diagnosis not present

## 2022-03-25 DIAGNOSIS — H353132 Nonexudative age-related macular degeneration, bilateral, intermediate dry stage: Secondary | ICD-10-CM | POA: Diagnosis not present

## 2022-03-25 DIAGNOSIS — Z79899 Other long term (current) drug therapy: Secondary | ICD-10-CM | POA: Diagnosis not present

## 2022-03-25 DIAGNOSIS — Z79620 Long term (current) use of immunosuppressive biologic: Secondary | ICD-10-CM | POA: Diagnosis not present

## 2022-04-07 NOTE — H&P (View-Only) (Signed)
PCP: [name], Mark, MD EP: Dr. Camnitz HF Cardiology: Dr. McLean  82 y.o. 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  Patient had an echo done in 3/23 showing EF 65-70%, mild LVH, normal RV, PASP 60 mmHg, moderate biatrial enlargement, mild MR, moderate TR. Prior echoes had shown mildly elevated PA pressure. Cardiolite in 10/21 showed no ischemia.   RHC was done in 5/23 showing mild pulmonary venous hypertension with mildly elevated PCWP. She was started on Farxiga.  She developed a persistent UTI that was difficult to clear and stopped Farxiga.   Acute follow up 02/20/22 for dyspnea. CTA chest showed no PE, moderate right pleural effusion, mild pulmonary edema. NYHA III symptoms and volume overloaded, she was given IV lasix 80 mg x 1 then instructed to use [name] daily x 2 days, then switch from Lasix to torsemide 60 mg daily. RHC arranged which showed elevated R/L heart filling pressures, prominent V waves in PCWP tracing, preserved CO and moderate pulmonary venous hypertension. Echo arranged to assess MR and torsemide increased to 60/40.  S/p R therapeutic thoracentesis 02/25/22 yielding 350 ml fluid.  Today she returns for post cath HF follow up with her husband. Overall feeling better, has cramping in legs. She is not longer short of breath walking. Denies palpitations, abnormal bleeding, CP, dizziness, edema, or PND/Orthopnea. Appetite ok. No fever or chills. Weight at home 123 pounds. Taking all medications. She has repeat echo tomorrow.  ECG (personally reviewed): none ordered today.  Medtronic device interrogation: 1.5% AF (less since diuresis), 5.1 hr/day activity  Labs (3/23): hgb 9.7 Labs (4/23): K 3.2, creatinine 0.92 Labs (6/23): creatinine 0.8  Labs (11/23): K 3.5, creatinine 0.7 Labs (1/24): K 4.1,  creatinine 0.85  PMH: 1. Atrial fibrillation: Paroxysmal.  S/p AF (epicardial and endocardial) ablation in 10/13.  2. Tachy-brady syndrome: Medtronic PPM placed in 9/14.  3. H/o atypical atrial flutter 4. Hyperlipidemia 5. HTN 6. Hypothyroidism 7. Parkinsons disease: Followed by Dr. Tat 8. Cirrhosis: ?NAFLD. No ETOH.  9. Chronic diastolic CHF/pulmonary hypertension:  - LHC (2014): No significant CAD.  - Cardiolite (10/21): EF 71%, no ischemia.  - Echo (3/23): EF 65-70%, mild LVH, normal RV, PASP 60 mmHg, moderate biatrial enlargement, mild MR, moderate TR.  - RHC (5/23): mean RA 8, PA 49/13 mean 30, mean PCWP 19 with v-waves to 30, CI 3.96, PVR 1.8 WU.  - RHC (1/24): RA mean 12, PA 53/24 (mean 39) PCWP mean 23 with prominent v-waves to 35, CO/CI (Fick) 6.75/4.23, PVR 2.4 WU 10. B12 deficiency.   Social History   Socioeconomic History   Marital status: Married    Spouse name: Not on file   Number of children: 2   Years of education: Not on file   Highest education level: Master's degree (e.g., MA, MS, MEng, MEd, MSW, MBA)  Occupational History   Not on file  Tobacco Use   Smoking status: Never   Smokeless tobacco: Never  Vaping Use   Vaping Use: Never used  Substance and Sexual Activity   Alcohol use: No   Drug use: No   Sexual activity: Yes    Partners: Male    Birth control/protection: Post-menopausal  Other Topics Concern   Not on file  Social History Narrative   Lives in [place].  Retired librarian.   Right handed    Social Determinants of Health     Financial Resource Strain: Not on file  Food Insecurity: Not on file  Transportation Needs: Not on file  Physical Activity: Not on file  Stress: Not on file  Social Connections: Not on file  Intimate Partner Violence: Not on file   Family History  Problem Relation Age of Onset   Alzheimer's disease Mother    Heart failure Father    Healthy Child    Healthy Child    Breast cancer Maternal Aunt    ROS:  All systems reviewed and negative except as per HPI.   Current Outpatient Medications  Medication Sig Dispense Refill   acetaminophen (TYLENOL) 500 MG tablet Take 1,000 mg by mouth at bedtime as needed (pain.).     Acetylcysteine (NAC 600) 600 MG CAPS Take 600 mg by mouth in the morning.     apixaban (ELIQUIS) 5 MG TABS tablet Take 1 tablet (5 mg total) by mouth 2 (two) times daily. 180 tablet 1   BELSOMRA 20 MG TABS Take 1 tablet by mouth at bedtime as needed (sleep).     Calcium Citrate-Vitamin D (CITRACAL + D PO) Take 1 tablet by mouth every evening.     carbidopa-levodopa (SINEMET CR) 50-200 MG tablet TAKE ONE TABLET BY MOUTH EVERYDAY AT BEDTIME 90 tablet 0   carbidopa-levodopa (SINEMET IR) 25-100 MG tablet TAKE 2 TABLETS BY MOUTH 3 TIMES DAILY. 540 tablet 1   cholecalciferol (VITAMIN D3) 25 MCG (1000 UNIT) tablet Take 1,000 Units by mouth in the morning.     cyanocobalamin (,VITAMIN B-12,) 1000 MCG/ML injection INJECT 1ML INTRAMUSCULARLY IN THE DELTOID ONCE A MONTH (ALTERNATING DELT. EACH MONTH)     Cyanocobalamin (VITAMIN B12) 1000 MCG TBCR Take 1,000 mcg by mouth in the morning.     docusate sodium (COLACE) 100 MG capsule Take 100 mg by mouth in the morning and at bedtime.     GLUTATHIONE PO Take 2 capsules by mouth in the morning.     levothyroxine (SYNTHROID) 50 MCG tablet Take 50-75 mcg by mouth See admin instructions. Take 2 tablets (100 mcg) by mouth on Mondays and Thursdays in the morning. Take 1 tablet (50 mcg) by mouth on Sundays, Tuesdays, Wednesdays, Fridays & Saturdays.     magnesium oxide (MAG-OX) 400 MG tablet Take 400 mg by mouth at bedtime.     metoprolol succinate (TOPROL-XL) 25 MG 24 hr tablet TAKE 1 TABLET BY MOUTH EVERY DAY (Patient taking differently: Take 25 mg by mouth every evening. TAKE 1 TABLET BY MOUTH EVERY DAY) 90 tablet 3   multivitamin-lutein (OCUVITE-LUTEIN) CAPS capsule Take 1 capsule by mouth in the morning.     NON FORMULARY Take 1 tablet by mouth at  bedtime. MetaCalm Stress Regulation Formula     polycarbophil (FIBERCON) 625 MG tablet Take 1,250 mg by mouth in the morning.     potassium chloride (MICRO-K) 10 MEQ CR capsule Take 6 capsules (60 mEq total) by mouth 2 (two) times daily. (Patient taking differently: Take 4 mEq by mouth 2 (two) times daily.) 240 capsule 3   Probiotic Product (PROBIOTIC PO) Take 1 capsule by mouth every evening.     Propylene Glycol (SYSTANE BALANCE) 0.6 % SOLN Place 1 drop into both eyes 2 (two) times daily as needed (dry/irritated eyes.).     rasagiline (AZILECT) 1 MG TABS tablet TAKE 1 TABLET BY MOUTH EVERY DAY 90 tablet 0   spironolactone (ALDACTONE) 25 MG tablet Take 0.5 tablets (12.5 mg total) by mouth daily. 15 tablet 6   torsemide (  DEMADEX) 20 MG tablet Take 3 tablets (60 mg total) by mouth daily with breakfast AND 2 tablets (40 mg total) every evening. 180 tablet 3   valACYclovir (VALTREX) 1000 MG tablet Take 1,000 mg by mouth 2 (two) times daily as needed (fever blisters).     vitamin C (ASCORBIC ACID) 500 MG tablet Take 500 mg by mouth in the morning.     No current facility-administered medications for this visit.   LMP 02/04/1996   Wt Readings from Last 3 Encounters:  03/25/22 57 kg (125 lb 9 oz)  03/12/22 58.1 kg (128 lb)  03/10/22 57.8 kg (127 lb 8 oz)   Physical Exam General:  NAD. No resp difficulty, walked into clinic HEENT: Normal Neck: Supple. No JVD. Carotids 2+ bilat; no bruits. No lymphadenopathy or thryomegaly appreciated. Cor: PMI nondisplaced. Regular rate & rhythm. No rubs, gallops or murmurs. Lungs: Clear Abdomen: Soft, nontender, nondistended. No hepatosplenomegaly. No bruits or masses. Good bowel sounds. Extremities: No cyanosis, clubbing, rash, edema Neuro: Alert & oriented x 3, cranial nerves grossly intact. Moves all 4 extremities w/o difficulty, + moderate dyskinesia. Affect pleasant.  Assessment/Plan: 1. Pulmonary hypertension: Patient was noted on 3/23 echo to have  moderately elevated PA pressure (60 mmHg).  Prior echoes had shown mildly elevated PA pressure.  RV was normal on this echo, there was moderate TR.  RHC in 5/23 showed mild pulmonary venous hypertension (group 2) with mildly elevated PCWP.  Diastolic LV dysfunction with elevated LA pressure (pulmonary venous hypertension) is the most likely etiology. RHC (1/24) showed moderate pulmonary venous hypertension with elevated PCWP. 2. Chronic diastolic CHF: Echo in 3/23 with EF 65-70%, mild LVH, normal RV, PASP 60 mmHg, moderate biatrial enlargement, mild MR, moderate TR. RHC in 5/23 showed elevated PCWP with prominent v-waves.  Echo from 3/23 reviewed, no more than mild MR so suspect elevated v-waves due to diastolic dysfunction.  She had pulmonary venous hypertension. She had recent weight gain of 13 lbs and worsening dyspnea. She was diuresed with IV lasix and [name] and then underwent RHC (1/24) showing elevated R/L heart filling pressures, prominent V-waves in PCWP tracing, preserved CO and moderate pulmonary venous hypertension. She has repeat echo arranged for tomorrow to evaluate MR vs worsening diastolic dysfunction, as well as ensuring EF not declining with chronic RV pacing, though EF was normal in 3/23. Today, she has improved NYHA I-II symptoms, she is not volume overloaded on exam, weight is down 13 lbs. - Start spironolactone 12.5 mg daily and decrease KCL to 40 bid. Recent labs ok, repeat BMET in 1 week. - Continue torsemide 60 mg q am/40 mg q pm. - Off Farxiga due to persistent UTI. No GU symptoms today. 3. Atrial fibrillation: Paroxysmal. She is regula on exam today. She has periodic AF by device interrogation, higher burden over the last few weeks while she was volume overloaded.  She has had an epicardial and endocardial ablation in the past.  She failed Tikosyn in the past and did not tolerate amiodarone due to side effects. Query whether increased AF burden triggered her CHF, or is a product  of worsening volume retention.   - Continue Eliquis. No bleeding issues. - Continue Toprol XL.  - Will have her follow back with Dr. Camnitz to consider redo ablation. There is not a good anti-arrhythmic option for her.  4. Tachy-brady syndrome: Has MDT PPM. Had gen change 07/17/21 5. Cirrhosis: ?NAFLD.  Does not drink any ETOH now, was not a heavy drinker before.  -   Followed by [facility].   Follow up in 4 months with Dr. McLean.  Vasilios Ottaway M Harmonee Tozer FNP-BC 04/07/2022 

## 2022-04-07 NOTE — Progress Notes (Signed)
PCP: Rodrigo Ran, MD EP: Dr. Elberta Fortis HF Cardiology: Dr. Shirlee Latch  82 y.o. 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  Patient had an echo done in 3/23 showing EF 65-70%, mild LVH, normal RV, PASP 60 mmHg, moderate biatrial enlargement, mild MR, moderate TR. Prior echoes had shown mildly elevated PA pressure. Cardiolite in 10/21 showed no ischemia.   RHC was done in 5/23 showing mild pulmonary venous hypertension with mildly elevated PCWP. She was started on Farxiga.  She developed a persistent UTI that was difficult to clear and stopped [place].   Acute follow up 02/20/22 for dyspnea. CTA chest showed no PE, moderate right pleural effusion, mild pulmonary edema. NYHA III symptoms and volume overloaded, she was given IV lasix 80 mg x 1 then instructed to use [name] daily x 2 days, then switch from Lasix to torsemide 60 mg daily. RHC arranged which showed elevated R/L heart filling pressures, prominent V waves in PCWP tracing, preserved CO and moderate pulmonary venous hypertension. Echo arranged to assess MR and torsemide increased to 60/40.  S/p R therapeutic thoracentesis 02/25/22 yielding 350 ml fluid.  Today she returns for post cath HF follow up with her husband. Overall feeling better, has cramping in legs. She is not longer short of breath walking. Denies palpitations, abnormal bleeding, CP, dizziness, edema, or PND/Orthopnea. Appetite ok. No fever or chills. Weight at home 123 pounds. Taking all medications. She has repeat echo tomorrow.  ECG (personally reviewed): none ordered today.  Medtronic device interrogation: 1.5% AF (less since diuresis), 5.1 hr/day activity  Labs (3/23): hgb 9.7 Labs (4/23): K 3.2, creatinine 0.92 Labs (6/23): creatinine 0.8  Labs (11/23): K 3.5, creatinine 0.7 Labs (1/24): K 4.1,  creatinine 0.85  PMH: 1. Atrial fibrillation: Paroxysmal.  S/p AF (epicardial and endocardial) ablation in 10/13.  2. Tachy-brady syndrome: Medtronic PPM placed in 9/14.  3. H/o atypical atrial flutter 4. Hyperlipidemia 5. HTN 6. Hypothyroidism 7. Parkinsons disease: Followed by Dr. Arbutus Leas 8. Cirrhosis: ?NAFLD. No ETOH.  9. Chronic diastolic CHF/pulmonary hypertension:  - LHC (2014): No significant CAD.  - Cardiolite (10/21): EF 71%, no ischemia.  - Echo (3/23): EF 65-70%, mild LVH, normal RV, PASP 60 mmHg, moderate biatrial enlargement, mild MR, moderate TR.  - RHC (5/23): mean RA 8, PA 49/13 mean 30, mean PCWP 19 with v-waves to 30, CI 3.96, PVR 1.8 WU.  - RHC (1/24): RA mean 12, PA 53/24 (mean 39) PCWP mean 23 with prominent v-waves to 35, CO/CI (Fick) 6.75/4.23, PVR 2.4 WU 10. B12 deficiency.   Social History   Socioeconomic History   Marital status: Married    Spouse name: Not on file   Number of children: 2   Years of education: Not on file   Highest education level: Master's degree (e.g., MA, MS, MEng, MEd, MSW, MBA)  Occupational History   Not on file  Tobacco Use   Smoking status: Never   Smokeless tobacco: Never  Vaping Use   Vaping Use: Never used  Substance and Sexual Activity   Alcohol use: No   Drug use: No   Sexual activity: Yes    Partners: Male    Birth control/protection: Post-menopausal  Other Topics Concern   Not on file  Social History Narrative   Lives in [place].  Retired Comptroller.   Right handed    Social Determinants of Health  Financial Resource Strain: Not on file  Food Insecurity: Not on file  Transportation Needs: Not on file  Physical Activity: Not on file  Stress: Not on file  Social Connections: Not on file  Intimate Partner Violence: Not on file   Family History  Problem Relation Age of Onset   Alzheimer's disease Mother    Heart failure Father    Healthy Child    Healthy Child    Breast cancer Maternal Aunt    ROS:  All systems reviewed and negative except as per HPI.   Current Outpatient Medications  Medication Sig Dispense Refill   acetaminophen (TYLENOL) 500 MG tablet Take 1,000 mg by mouth at bedtime as needed (pain.).     Acetylcysteine (NAC 600) 600 MG CAPS Take 600 mg by mouth in the morning.     apixaban (ELIQUIS) 5 MG TABS tablet Take 1 tablet (5 mg total) by mouth 2 (two) times daily. 180 tablet 1   BELSOMRA 20 MG TABS Take 1 tablet by mouth at bedtime as needed (sleep).     Calcium Citrate-Vitamin D (CITRACAL + D PO) Take 1 tablet by mouth every evening.     carbidopa-levodopa (SINEMET CR) 50-200 MG tablet TAKE ONE TABLET BY MOUTH EVERYDAY AT BEDTIME 90 tablet 0   carbidopa-levodopa (SINEMET IR) 25-100 MG tablet TAKE 2 TABLETS BY MOUTH 3 TIMES DAILY. 540 tablet 1   cholecalciferol (VITAMIN D3) 25 MCG (1000 UNIT) tablet Take 1,000 Units by mouth in the morning.     cyanocobalamin (,VITAMIN B-12,) 1000 MCG/ML injection INJECT INTRAMUSCULARLY IN THE DELTOID ONCE A MONTH (ALTERNATING DELT. EACH MONTH)     Cyanocobalamin (VITAMIN B12) 1000 MCG TBCR Take 1,000 mcg by mouth in the morning.     docusate sodium (COLACE) 100 MG capsule Take 100 mg by mouth in the morning and at bedtime.     GLUTATHIONE PO Take 2 capsules by mouth in the morning.     levothyroxine (SYNTHROID) 50 MCG tablet Take 50-75 mcg by mouth See admin instructions. Take 2 tablets (100 mcg) by mouth on Mondays and Thursdays in the morning. Take 1 tablet (50 mcg) by mouth on Sundays, Tuesdays, Wednesdays, Fridays & Saturdays.     magnesium oxide (MAG-OX) 400 MG tablet Take 400 mg by mouth at bedtime.     metoprolol succinate (TOPROL-XL) 25 MG 24 hr tablet TAKE 1 TABLET BY MOUTH EVERY DAY (Patient taking differently: Take 25 mg by mouth every evening. TAKE 1 TABLET BY MOUTH EVERY DAY) 90 tablet 3   multivitamin-lutein (OCUVITE-LUTEIN) CAPS capsule Take 1 capsule by mouth in the morning.     NON FORMULARY Take 1 tablet by mouth at  bedtime. MetaCalm Stress Regulation Formula     polycarbophil (FIBERCON) 625 MG tablet Take 1,250 mg by mouth in the morning.     potassium chloride (MICRO-K) 10 MEQ CR capsule Take 6 capsules (60 mEq total) by mouth 2 (two) times daily. (Patient taking differently: Take 4 mEq by mouth 2 (two) times daily.) 240 capsule 3   Probiotic Product (PROBIOTIC PO) Take 1 capsule by mouth every evening.     Propylene Glycol (SYSTANE BALANCE) 0.6 % SOLN Place 1 drop into both eyes 2 (two) times daily as needed (dry/irritated eyes.).     rasagiline (AZILECT) 1 MG TABS tablet TAKE 1 TABLET BY MOUTH EVERY DAY 90 tablet 0   spironolactone (ALDACTONE) 25 MG tablet Take 0.5 tablets (12.5 mg total) by mouth daily. 15 tablet 6   torsemide (  DEMADEX) 20 MG tablet Take 3 tablets (60 mg total) by mouth daily with breakfast AND 2 tablets (40 mg total) every evening. 180 tablet 3   valACYclovir (VALTREX) 1000 MG tablet Take 1,000 mg by mouth 2 (two) times daily as needed (fever blisters).     vitamin C (ASCORBIC ACID) 500 MG tablet Take 500 mg by mouth in the morning.     No current facility-administered medications for this visit.   LMP 02/04/1996   Wt Readings from Last 3 Encounters:  03/25/22 57 kg (125 lb 9 oz)  03/12/22 58.1 kg (128 lb)  03/10/22 57.8 kg (127 lb 8 oz)   Physical Exam General:  NAD. No resp difficulty, walked into clinic HEENT: Normal Neck: Supple. No JVD. Carotids 2+ bilat; no bruits. No lymphadenopathy or thryomegaly appreciated. Cor: PMI nondisplaced. Regular rate & rhythm. No rubs, gallops or murmurs. Lungs: Clear Abdomen: Soft, nontender, nondistended. No hepatosplenomegaly. No bruits or masses. Good bowel sounds. Extremities: No cyanosis, clubbing, rash, edema Neuro: Alert & oriented x 3, cranial nerves grossly intact. Moves all 4 extremities w/o difficulty, + moderate dyskinesia. Affect pleasant.  Assessment/Plan: 1. Pulmonary hypertension: Patient was noted on 3/23 echo to have  moderately elevated PA pressure (60 mmHg).  Prior echoes had shown mildly elevated PA pressure.  RV was normal on this echo, there was moderate TR.  RHC in 5/23 showed mild pulmonary venous hypertension (group 2) with mildly elevated PCWP.  Diastolic LV dysfunction with elevated LA pressure (pulmonary venous hypertension) is the most likely etiology. RHC (1/24) showed moderate pulmonary venous hypertension with elevated PCWP. 2. Chronic diastolic CHF: Echo in 3/23 with EF 65-70%, mild LVH, normal RV, PASP 60 mmHg, moderate biatrial enlargement, mild MR, moderate TR. RHC in 5/23 showed elevated PCWP with prominent v-waves.  Echo from 3/23 reviewed, no more than mild MR so suspect elevated v-waves due to diastolic dysfunction.  She had pulmonary venous hypertension. She had recent weight gain of 13 lbs and worsening dyspnea. She was diuresed with IV lasix and [name] and then underwent RHC (1/24) showing elevated R/L heart filling pressures, prominent V-waves in PCWP tracing, preserved CO and moderate pulmonary venous hypertension. She has repeat echo arranged for tomorrow to evaluate MR vs worsening diastolic dysfunction, as well as ensuring EF not declining with chronic RV pacing, though EF was normal in 3/23. Today, she has improved NYHA I-II symptoms, she is not volume overloaded on exam, weight is down 13 lbs. - Start spironolactone 12.5 mg daily and decrease KCL to 40 bid. Recent labs ok, repeat BMET in 1 week. - Continue torsemide 60 mg q am/40 mg q pm. - Off Farxiga due to persistent UTI. No GU symptoms today. 3. Atrial fibrillation: Paroxysmal. She is regula on exam today. She has periodic AF by device interrogation, higher burden over the last few weeks while she was volume overloaded.  She has had an epicardial and endocardial ablation in the past.  She failed Tikosyn in the past and did not tolerate amiodarone due to side effects. Query whether increased AF burden triggered her CHF, or is a product  of worsening volume retention.   - Continue Eliquis. No bleeding issues. - Continue Toprol XL.  - Will have her follow back with Dr. Elberta Fortis to consider redo ablation. There is not a good anti-arrhythmic option for her.  4. Tachy-brady syndrome: Has MDT PPM. Had gen change 07/17/21 5. Cirrhosis: ?NAFLD.  Does not drink any ETOH now, was not a heavy drinker before.  -  Followed by [facility].   Follow up in 4 months with Dr. Shirlee Latch.  [facility] FNP-BC 04/07/2022

## 2022-04-08 ENCOUNTER — Encounter (HOSPITAL_COMMUNITY): Payer: Self-pay

## 2022-04-08 ENCOUNTER — Ambulatory Visit (HOSPITAL_COMMUNITY)
Admission: RE | Admit: 2022-04-08 | Discharge: 2022-04-08 | Disposition: A | Discharge status: Home / Self Care | Payer: Medicare Other | Source: Ambulatory Visit | Attending: Family Medicine | Admitting: Family Medicine

## 2022-04-08 VITALS — BP 110/72 | HR 76 | Wt 127.6 lb

## 2022-04-08 DIAGNOSIS — I071 Rheumatic tricuspid insufficiency: Secondary | ICD-10-CM | POA: Diagnosis not present

## 2022-04-08 DIAGNOSIS — R6889 Other general symptoms and signs: Secondary | ICD-10-CM | POA: Diagnosis not present

## 2022-04-08 DIAGNOSIS — I272 Pulmonary hypertension, unspecified: Secondary | ICD-10-CM | POA: Diagnosis not present

## 2022-04-08 DIAGNOSIS — I495 Sick sinus syndrome: Secondary | ICD-10-CM

## 2022-04-08 DIAGNOSIS — K7469 Other cirrhosis of liver: Secondary | ICD-10-CM | POA: Diagnosis not present

## 2022-04-08 DIAGNOSIS — I4891 Unspecified atrial fibrillation: Secondary | ICD-10-CM

## 2022-04-08 DIAGNOSIS — I5032 Chronic diastolic (congestive) heart failure: Secondary | ICD-10-CM

## 2022-04-08 LAB — CBC
HCT: 29.5 % — ABNORMAL LOW (ref 36.0–46.0)
Hemoglobin: 9.6 g/dL — ABNORMAL LOW (ref 12.0–15.0)
MCH: 29.2 pg (ref 26.0–34.0)
MCHC: 32.5 g/dL (ref 30.0–36.0)
MCV: 89.7 fL (ref 80.0–100.0)
Platelets: 206 10*3/uL (ref 150–400)
RBC: 3.29 MIL/uL — ABNORMAL LOW (ref 3.87–5.11)
RDW: 15 % (ref 11.5–15.5)
WBC: 4.4 10*3/uL (ref 4.0–10.5)
nRBC: 0 % (ref 0.0–0.2)

## 2022-04-08 LAB — BASIC METABOLIC PANEL
Anion gap: 9 (ref 5–15)
BUN: 24 mg/dL — ABNORMAL HIGH (ref 8–23)
CO2: 29 mmol/L (ref 22–32)
Calcium: 9.2 mg/dL (ref 8.9–10.3)
Chloride: 99 mmol/L (ref 98–111)
Creatinine, Ser: 0.93 mg/dL (ref 0.44–1.00)
GFR, Estimated: >60 mL/min (ref >60)
Glucose, Bld: 128 mg/dL — ABNORMAL HIGH (ref 70–99)
Potassium: 3.3 mmol/L — ABNORMAL LOW (ref 3.5–5.1)
Sodium: 137 mmol/L (ref 135–145)

## 2022-04-08 NOTE — Patient Instructions (Addendum)
Thank you for coming in today  Labs were done today, if any labs are abnormal the clinic will call you No news is good news  Medications: None   Follow up appointments:  Your physician recommends that you schedule a follow-up appointment in:  05/20/2022 at 1:40 pm   You are scheduled for a TEE/Cardioversion/TEE Cardioversion on 04/23/2022 with Dr. Aundra Dubin.  Please arrive at the [facility] (Main Entrance A) at [facility]: [address] [place], [place] [address] at 2:00 pm. (1 hour prior to procedure unless lab work is needed; if lab work is needed arrive 1.5 hours ahead)  DIET: Nothing to eat or drink after midnight except a sip of water with medications (see medication instructions below)  Medication Instructions:  You will need to continue your anticoagulant after your procedure until you  are told by your  Provider that it is safe to stop   Labs: If patient is on Coumadin, patient needs pt/INR, CBC, BMET within 3 days (No pt/INR needed for patients taking Xarelto, Eliquis, Pradaxa) For patients receiving anesthesia for TEE and all Cardioversion patients: BMET, CBC within 1 week   You must have a responsible person to drive you home and stay in the waiting area during your procedure. Failure to do so could result in cancellation.  Bring your insurance cards.  *Special Note: Every effort is made to have your procedure done on time. Occasionally there are emergencies that occur at the hospital that may cause delays. Please be patient if a delay does occur.      Do the following things EVERYDAY: Weigh yourself in the morning before breakfast. Write it down and keep it in a log. Take your medicines as prescribed Eat low salt foods--Limit salt (sodium) to 2000 mg per day.  Stay as active as you can everyday Limit all fluids for the day to less than 2 liters   At the [facility], you and your health needs are our priority. As part of our continuing  mission to provide you with exceptional heart care, we have created designated Provider Care Teams. These Care Teams include your primary Cardiologist (physician) and Advanced Practice Providers (APPs- Physician Assistants and Nurse Practitioners) who all work together to provide you with the care you need, when you need it.   You may see any of the following providers on your designated Care Team at your next follow up: Dr Glori Bickers Dr Loralie Champagne Dr. Roxana Hires, NP Lyda Jester, [place] [facility] [place], [place] Forestine Na, NP Audry Riles, PharmD   Please be sure to bring in all your medications bottles to every appointment.    Thank you for choosing [facility]  If you have any questions or concerns before your next appointment please send [place] a message through [place] or call our office at [phone].    TO LEAVE A MESSAGE FOR THE NURSE SELECT OPTION 2, PLEASE LEAVE A MESSAGE INCLUDING: YOUR NAME DATE OF BIRTH CALL BACK NUMBER REASON FOR CALL**this is important as we prioritize the call backs  YOU WILL RECEIVE A CALL BACK THE SAME DAY AS LONG AS YOU CALL BEFORE 4:00 PM

## 2022-04-09 ENCOUNTER — Other Ambulatory Visit (HOSPITAL_COMMUNITY): Payer: Self-pay

## 2022-04-09 ENCOUNTER — Other Ambulatory Visit (HOSPITAL_COMMUNITY): Payer: Self-pay | Admitting: Cardiology

## 2022-04-09 DIAGNOSIS — I071 Rheumatic tricuspid insufficiency: Secondary | ICD-10-CM

## 2022-04-09 MED ORDER — POTASSIUM CHLORIDE ER 10 MEQ PO CPCR: 60.0000 meq | ORAL_CAPSULE | Freq: Two times a day (BID) | ORAL | 3 refills | Status: DC

## 2022-04-09 MED ORDER — TORSEMIDE 20 MG PO TABS: ORAL_TABLET | ORAL | 3 refills | Status: DC

## 2022-04-16 ENCOUNTER — Telehealth: Payer: Self-pay

## 2022-04-16 ENCOUNTER — Encounter: Payer: Self-pay | Admitting: Cardiology

## 2022-04-16 ENCOUNTER — Encounter: Payer: Medicare Other | Attending: Cardiology | Admitting: Cardiology

## 2022-04-16 VITALS — BP 118/70 | HR 82 | Ht 61.0 in | Wt 127.0 lb

## 2022-04-16 DIAGNOSIS — I4819 Other persistent atrial fibrillation: Secondary | ICD-10-CM | POA: Diagnosis not present

## 2022-04-16 DIAGNOSIS — D6869 Other thrombophilia: Secondary | ICD-10-CM | POA: Insufficient documentation

## 2022-04-16 DIAGNOSIS — I495 Sick sinus syndrome: Secondary | ICD-10-CM | POA: Diagnosis not present

## 2022-04-16 NOTE — Patient Instructions (Signed)
Medication Instructions:  Your physician recommends that you continue on your current medications as directed. Please refer to the Current Medication list given to you today.  *If you need a refill on your cardiac medications before your next appointment, please call your pharmacy*   Lab Work: None ordered   Testing/Procedures: None ordered   Follow-Up: At [facility], you and your health needs are our priority.  As part of our continuing mission to provide you with exceptional heart care, we have created designated Provider Care Teams.  These Care Teams include your primary Cardiologist (physician) and Advanced Practice Providers (APPs -  Physician Assistants and Nurse Practitioners) who all work together to provide you with the care you need, when you need it.  Your next appointment:   2 month(s)  (end of May)  The format for your next appointment:   In Person  Provider:   Allegra Lai, MD    Thank you for choosing [facility]!!   Trinidad Curet, RN [phone]

## 2022-04-16 NOTE — Progress Notes (Signed)
Electrophysiology Office Note   Date:  04/16/2022   ID:  [name], [name] 1940-04-30, MRN [id number]  PCP:  [name] Infante, MD  Cardiologist:   Primary Electrophysiologist:  [name] Firkus Meredith Leeds, MD    Chief Complaint: pacemaker   History of Present Illness: [name] [name] is a 82 y.o. female who is being seen today for the evaluation of pacemaker at the request of [name] Infante, MD. Presenting today for electrophysiology evaluation.  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  Today, denies symptoms of palpitations, chest pain, shortness of breath, orthopnea, PND, lower extremity edema, claudication, dizziness, presyncope, syncope, bleeding, or neurologic sequela. The patient is tolerating medications without difficulties.  Since being seen, she has continued at her current level of health.  She has no chest pain, but she does have chronic fatigue.  She is also short of breath that she attributes to her heart failure and pulmonary hypertension.  She does have atrial fibrillation with a 3% burden noted on her cardiac monitor.  She is unaware when she is in atrial fibrillation.    Past Medical History:  Diagnosis Date   Anemia    Arthritis    thumbs   Atrial tachycardia    Atypical atrial flutter (HCC)    Cataract    bilateral   Chronic anticoagulation    on Xarelto   Chronic diastolic CHF (congestive heart failure) (HCC)    Echocardiogram (11/24/12): Mild LVH, EF 60%.   Dysrhythmia    GERD (gastroesophageal reflux disease)    hx of no meds   Heart murmur    slight   Hyperlipidemia    a. Hx of leg weakness while on statin. under control   Hypertension    Hypothyroidism    Leg fracture Dec 21,2011   Osteoporosis 07/2007   Parkinson's disease 2011    Persistent atrial fibrillation ([place])    s/p atrial fib ablation 11-11-11.    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[place])    Squamous carcinoma summer 2015   back of left thigh   Wrist fracture    right   Past Surgical History:  Procedure Laterality Date   ATRIAL FIBRILLATION ABLATION N/A 11/11/2011   Procedure: ATRIAL FIBRILLATION ABLATION;  Surgeon: Thompson Grayer, MD;  Location: [facility] CATH LAB;  Service: Cardiovascular;  Laterality: N/A;   BREAST BIOPSY  1998   BREAST SURGERY     benign cyst removed   CARDIAC ELECTROPHYSIOLOGY STUDY AND ABLATION  5/14, 7/14   convergent AF ablation at [facility] and subsequent atypical atrial flutter ablation by Dr Lehman Prom   CARDIOVASCULAR STRESS TEST  06-06-2008   EF 73%   CARDIOVERSION  09/09/2011   Procedure: CARDIOVERSION;  Surgeon: Darlin Coco, MD;  Location: [facility] ENDOSCOPY;  Service: Cardiovascular;  Laterality: N/A;  to be done by dr. Mare Ferrari   CARDIOVERSION  02/18/2012   Procedure: CARDIOVERSION;  Surgeon: Thayer Headings, MD;  Location: [facility] ENDOSCOPY;  Service: Cardiovascular;  Laterality: N/A;   [place]  2011   metal pin in place   FOREARM SURGERY Left 2010   "opened arm to drain it"   INSERTION OF MESH N/A 11/24/2013  Procedure: INSERTION OF MESH;  Surgeon: Michael Boston, MD;  Location: [facility] ORS;  Service: General;  Laterality: N/A;   IR THORACENTESIS ASP PLEURAL SPACE W/IMG GUIDE  02/25/2022   KNEE SURGERY Left 2001   Left unicompartmental knee     Dr. Alvan Dame 11/18/16   PACEMAKER PLACEMENT  10/20/12   MDT Advisa DR implanted by Dr Lehman Prom at [place] Left 11/18/2016   Procedure: LEFT UNICOMPARTMENTAL KNEE Medially;  Surgeon: Paralee Cancel, MD;  Location: [facility] ORS;  Service: Orthopedics;  Laterality: Left;  90 mins   PPM GENERATOR  CHANGEOUT N/A 07/17/2021   Procedure: PPM GENERATOR CHANGEOUT;  Surgeon: Constance Haw, MD;  Location: [place] CV LAB;  Service: Cardiovascular;  Laterality: N/A;   RIGHT HEART CATH N/A 06/13/2021   Procedure: RIGHT HEART CATH;  Surgeon: Larey Dresser, MD;  Location: [place] CV LAB;  Service: Cardiovascular;  Laterality: N/A;   RIGHT HEART CATH N/A 03/06/2022   Procedure: RIGHT HEART CATH;  Surgeon: Larey Dresser, MD;  Location: [place] CV LAB;  Service: Cardiovascular;  Laterality: N/A;   SKIN CANCER DESTRUCTION  summer 2015   TEE WITHOUT CARDIOVERSION  11/10/2011   Procedure: TRANSESOPHAGEAL ECHOCARDIOGRAM (TEE);  Surgeon: Thayer Headings, MD;  Location: [place];  Service: Cardiovascular;  Laterality: N/A;   TONSILLECTOMY  as child   US ECHOCARDIOGRAPHY  03-10-2008   Est EF 55-60%, Dr. Cathie Olden   VENTRAL HERNIA REPAIR N/A 11/24/2013   Procedure: LAPAROSCOPIC VENTRAL HERNIA;  Surgeon: Michael Boston, MD;  Location: [facility] ORS;  Service: General;  Laterality: N/A;   WRIST SURGERY Right ~2009   metal rod in place     Current Outpatient Medications  Medication Sig Dispense Refill   acetaminophen (TYLENOL) 500 MG tablet Take 1,000 mg by mouth at bedtime as needed (pain.).     Acetylcysteine (NAC 600) 600 MG CAPS Take 600 mg by mouth in the morning.     apixaban (ELIQUIS) 5 MG TABS tablet Take 1 tablet (5 mg total) by mouth 2 (two) times daily. 180 tablet 1   BELSOMRA 20 MG TABS Take 1 tablet by mouth at bedtime as needed (sleep).     Calcium Citrate-Vitamin D (CITRACAL + D PO) Take 1 tablet by mouth every evening.     carbidopa-levodopa (SINEMET CR) 50-200 MG tablet TAKE ONE TABLET BY MOUTH EVERYDAY AT BEDTIME 90 tablet 0   carbidopa-levodopa (SINEMET IR) 25-100 MG tablet TAKE 2 TABLETS BY MOUTH 3 TIMES DAILY. 540 tablet 1   cholecalciferol (VITAMIN D3) 25 MCG (1000 UNIT) tablet Take 1,000 Units by mouth in the morning.     cyanocobalamin (,VITAMIN B-12,) 1000 MCG/ML  injection INJECT 1ML INTRAMUSCULARLY IN THE DELTOID ONCE A MONTH (ALTERNATING DELT. EACH MONTH)     Cyanocobalamin (VITAMIN B12) 1000 MCG TBCR Take 1,000 mcg by mouth in the morning.     docusate sodium (COLACE) 100 MG capsule Take 100 mg by mouth in the morning and at bedtime.     GLUTATHIONE PO Take 2 capsules by mouth in the morning.     levothyroxine (SYNTHROID) 50 MCG tablet Take 50-75 mcg by mouth See admin instructions. Take 2 tablets (100 mcg) by mouth on Mondays and Thursdays in the morning. Take 1 tablet (50 mcg) by mouth on Sundays, Tuesdays, Wednesdays, Fridays & Saturdays.     magnesium oxide (MAG-OX) 400 MG tablet Take 400 mg by mouth at bedtime.     metoprolol succinate (TOPROL-XL) 25 MG  24 hr tablet TAKE 1 TABLET BY MOUTH EVERY DAY (Patient taking differently: Take 25 mg by mouth every evening. TAKE 1 TABLET BY MOUTH EVERY DAY) 90 tablet 3   multivitamin-lutein (OCUVITE-LUTEIN) CAPS capsule Take 1 capsule by mouth in the morning.     NON FORMULARY Take 1 tablet by mouth at bedtime. MetaCalm Stress Regulation Formula     polycarbophil (FIBERCON) 625 MG tablet Take 1,250 mg by mouth in the morning.     potassium chloride (MICRO-K) 10 MEQ CR capsule Take 6 capsules (60 mEq total) by mouth 2 (two) times daily. 240 capsule 3   Probiotic Product (PROBIOTIC PO) Take 1 capsule by mouth every evening.     Propylene Glycol (SYSTANE BALANCE) 0.6 % SOLN Place 1 drop into both eyes 2 (two) times daily as needed (dry/irritated eyes.).     rasagiline (AZILECT) 1 MG TABS tablet TAKE 1 TABLET BY MOUTH EVERY DAY 90 tablet 0   spironolactone (ALDACTONE) 25 MG tablet Take 0.5 tablets (12.5 mg total) by mouth daily. 15 tablet 6   torsemide (DEMADEX) 20 MG tablet Take 3 tablets (60 mg total) by mouth daily with breakfast AND 2 tablets (40 mg total) every evening. 180 tablet 3   valACYclovir (VALTREX) 1000 MG tablet Take 1,000 mg by mouth 2 (two) times daily as needed (fever blisters).     vitamin C  (ASCORBIC ACID) 500 MG tablet Take 500 mg by mouth in the morning.     No current facility-administered medications for this visit.    Allergies:   Amiodarone and Statins   Social History:  The patient  reports that she has never smoked. She has never used smokeless tobacco. She reports that she does not drink alcohol and does not use drugs.   Family History:  The patient's family history includes Alzheimer's disease in her mother; Breast cancer in her maternal aunt; Healthy in her child and child; Heart failure in her father.   ROS:  Please see the history of present illness.   Otherwise, review of systems is positive for none.   All other systems are reviewed and negative.   PHYSICAL EXAM: VS:  BP 118/70   Pulse 82   Ht '5\' 1"'$  (1.549 m)   Wt 127 lb (57.6 kg)   LMP 02/04/1996   SpO2 98%   BMI 24.00 kg/m  , BMI Body mass index is 24 kg/m. GEN: Well nourished, well developed, in no acute distress  HEENT: normal  Neck: no JVD, carotid bruits, or masses Cardiac: RRR; no murmurs, rubs, or gallops,no edema  Respiratory:  clear to auscultation bilaterally, normal work of breathing GI: soft, nontender, nondistended, + BS MS: no deformity or atrophy  Skin: warm and dry, device site well healed Neuro:  Strength and sensation are intact Psych: euthymic mood, full affect  EKG:  EKG is ordered today. Personal review of the ekg ordered 02/20/22 shows AV paced  Personal review of the device interrogation today. Results in [place]: 02/20/2022: B Natriuretic Peptide 125.9; Magnesium 2.2 03/11/2022: ALT <5; TSH 4.100 04/08/2022: BUN 24; Creatinine, Ser 0.93; Hemoglobin 9.6; Platelets 206; Potassium 3.3; Sodium 137    Lipid Panel     Component Value Date/Time   CHOL 133 02/20/2022 0913   TRIG 47 02/20/2022 0913   HDL 53 02/20/2022 0913   CHOLHDL 2.5 02/20/2022 0913   VLDL 9 02/20/2022 0913   LDLCALC 71 02/20/2022 0913     Wt Readings from Last 3 Encounters:  04/16/22  127 lb (57.6 kg)  04/08/22 127 lb 9.6 oz (57.9 kg)  03/25/22 125 lb 9 oz (57 kg)      Other studies Reviewed: Additional studies/ records that were reviewed today include: TTE 04/29/21  Review of the above records today demonstrates:   1. Left ventricular ejection fraction, by estimation, is 65 to 70%. The  left ventricle has normal function. The left ventricle has no regional  wall motion abnormalities. There is mild concentric left ventricular  hypertrophy. Left ventricular diastolic  parameters are indeterminate.   2. Right ventricular systolic function is normal. The right ventricular  size is normal. There is severely elevated pulmonary artery systolic  pressure. The estimated right ventricular systolic pressure is [id number] mmHg.   3. Left atrial size was moderately dilated.   4. Right atrial size was moderately dilated.   5. The mitral valve is grossly normal. Mild mitral valve regurgitation.   6. Tricuspid valve regurgitation is moderate.   7. The aortic valve is tricuspid. There is mild calcification of the  aortic valve. There is mild thickening of the aortic valve. Aortic valve  regurgitation is not visualized. Aortic valve sclerosis/calcification is  present, without any evidence of  aortic stenosis.    ASSESSMENT AND PLAN:  1.  Tachybradycardia syndrome: Status post Medtronic dual-chamber pacemaker with generator change 07/17/2021.  Device functioning appropriately.  No changes at this time.  2.  Longstanding persistent atrial fibrillation: Currently on Eliquis.  CHA2DS2-VASc of 5.  Has had both epicardial and endocardial ablation.  Based on device interrogation, she has a 3% burden of atrial fibrillation.  There was  by her primary cardiologist that ablation may be reasonable.  Dacy Enrico discuss the possibility of ablation further with her primary cardiologist.  She is unsure as to whether or not she wants to proceed.  She plans to set up the health app on her Apple Watch to see if  she can correlate atrial fibrillation with symptoms.  3.  Chronic diastolic heart failure: No obvious volume overload.  Plan per primary cardiology.  4.  Second hypercoagulable state: Currently on Eliquis  5.  Hypertension: Currently well-controlled   Current medicines are reviewed at length with the patient today.   The patient does not have concerns regarding her medicines.  The following changes were made today: None  Labs/ tests ordered today include:  No orders of the defined types were placed in this encounter.    Disposition:   FU 3 months  Signed, Keyonia Gluth Meredith Leeds, MD  04/16/2022 10:04 AM     [facility] [address] [place] [address] [phone] (office) [phone] (fax)

## 2022-04-16 NOTE — Telephone Encounter (Signed)
I ordered the patient a new Medtronic ID card.

## 2022-04-17 ENCOUNTER — Ambulatory Visit (INDEPENDENT_AMBULATORY_CARE_PROVIDER_SITE_OTHER): Payer: Medicare Other

## 2022-04-17 ENCOUNTER — Telehealth: Payer: Self-pay | Admitting: *Deleted

## 2022-04-17 DIAGNOSIS — I495 Sick sinus syndrome: Secondary | ICD-10-CM

## 2022-04-17 NOTE — Telephone Encounter (Signed)
Pt aware and agreeable to plan. Will keep May follow up with Dr. Curt Bears.

## 2022-04-17 NOTE — Telephone Encounter (Signed)
-----   Message from Will Meredith Leeds, MD sent at 04/17/2022 10:41 AM EDT ----- Thanks, will tell her that we will hold off for now. ----- Message ----- From: Larey Dresser, MD Sent: 04/16/2022   6:43 PM EDT To: Constance Haw, MD  That is not much atrial fibrillation, probably not causing her symptoms.  She has possibly severe TR, working up for possible [place].  Think ok to defer ablation .  ----- Message ----- From: Constance Haw, MD Sent: 04/16/2022  10:04 AM EDT To: Larey Dresser, MD  I saw Ms. Tippetts today.  She is in sinus rhythm.  I am not convinced that her atrial fibrillation is making her feel that poorly, as she has a 3% burden.  Despite this, I know that you have potentially recommended that she has an ablation.  With her low burden, I do think ablation would make much of a difference in her heart failure?  I am happy to proceed with ablation, but wanted to see if you had any thoughts with her low burden.  Thanks.

## 2022-04-20 LAB — CUP PACEART REMOTE DEVICE CHECK
Battery Remaining Longevity: 121 mo
Battery Voltage: 3.06 V
Brady Statistic AP VP Percent: 89.26 %
Brady Statistic AP VS Percent: 4.5 %
Brady Statistic AS VP Percent: 2.65 %
Brady Statistic AS VS Percent: 3.6 %
Brady Statistic RA Percent Paced: 97.2 %
Brady Statistic RV Percent Paced: 91.91 %
Date Time Interrogation Session: 20240314043800
Implantable Lead Connection Status: 753985
Implantable Lead Connection Status: 753985
Implantable Lead Implant Date: 20140917
Implantable Lead Implant Date: 20140917
Implantable Lead Location: 753859
Implantable Lead Location: 753860
Implantable Pulse Generator Implant Date: 20230614
Lead Channel Impedance Value: 304 Ohm
Lead Channel Impedance Value: 342 Ohm
Lead Channel Impedance Value: 361 Ohm
Lead Channel Impedance Value: 418 Ohm
Lead Channel Pacing Threshold Amplitude: 0.75 V
Lead Channel Pacing Threshold Amplitude: 0.875 V
Lead Channel Pacing Threshold Pulse Width: 0.4 ms
Lead Channel Pacing Threshold Pulse Width: 0.4 ms
Lead Channel Sensing Intrinsic Amplitude: 0.375 mV
Lead Channel Sensing Intrinsic Amplitude: 0.375 mV
Lead Channel Sensing Intrinsic Amplitude: 15.5 mV
Lead Channel Sensing Intrinsic Amplitude: 15.5 mV
Lead Channel Setting Pacing Amplitude: 1.5 V
Lead Channel Setting Pacing Amplitude: 2 V
Lead Channel Setting Pacing Pulse Width: 0.4 ms
Lead Channel Setting Sensing Sensitivity: 1.2 mV
Zone Setting Status: 755011

## 2022-04-21 ENCOUNTER — Ambulatory Visit
Admission: RE | Admit: 2022-04-21 | Discharge: 2022-04-21 | Disposition: A | Discharge status: Home / Self Care | Payer: Medicare Other | Source: Ambulatory Visit | Attending: Nurse Practitioner | Admitting: Nurse Practitioner

## 2022-04-21 ENCOUNTER — Inpatient Hospital Stay: Payer: Medicare Other | Attending: Hematology and Oncology

## 2022-04-21 DIAGNOSIS — K7469 Other cirrhosis of liver: Secondary | ICD-10-CM

## 2022-04-21 DIAGNOSIS — R188 Other ascites: Secondary | ICD-10-CM

## 2022-04-21 DIAGNOSIS — D734 Cyst of spleen: Secondary | ICD-10-CM | POA: Diagnosis not present

## 2022-04-21 DIAGNOSIS — N281 Cyst of kidney, acquired: Secondary | ICD-10-CM | POA: Diagnosis not present

## 2022-04-21 DIAGNOSIS — K7460 Unspecified cirrhosis of liver: Secondary | ICD-10-CM | POA: Diagnosis not present

## 2022-04-21 DIAGNOSIS — D649 Anemia, unspecified: Secondary | ICD-10-CM | POA: Insufficient documentation

## 2022-04-21 LAB — CBC WITH DIFFERENTIAL (CANCER CENTER ONLY)
Abs Immature Granulocytes: 0.01 10*3/uL (ref 0.00–0.07)
Basophils Absolute: 0 10*3/uL (ref 0.0–0.1)
Basophils Relative: 1 %
Eosinophils Absolute: 0.1 10*3/uL (ref 0.0–0.5)
Eosinophils Relative: 2 %
HCT: 33.3 % — ABNORMAL LOW (ref 36.0–46.0)
Hemoglobin: 10.8 g/dL — ABNORMAL LOW (ref 12.0–15.0)
Immature Granulocytes: 0 %
Lymphocytes Relative: 10 %
Lymphs Abs: 0.6 10*3/uL — ABNORMAL LOW (ref 0.7–4.0)
MCH: 29 pg (ref 26.0–34.0)
MCHC: 32.4 g/dL (ref 30.0–36.0)
MCV: 89.3 fL (ref 80.0–100.0)
Monocytes Absolute: 0.6 10*3/uL (ref 0.1–1.0)
Monocytes Relative: 12 %
Neutro Abs: 4 10*3/uL (ref 1.7–7.7)
Neutrophils Relative %: 75 %
Platelet Count: 222 10*3/uL (ref 150–400)
RBC: 3.73 MIL/uL — ABNORMAL LOW (ref 3.87–5.11)
RDW: 15.4 % (ref 11.5–15.5)
WBC Count: 5.4 10*3/uL (ref 4.0–10.5)
nRBC: 0 % (ref 0.0–0.2)

## 2022-04-22 NOTE — Progress Notes (Unsigned)
Assessment/Plan:   1.  Parkinsons Disease  - carbidopa/levodopa 25/100, 2 tablets three times per day and move dosages to 8am/noon/4pm  -Continue carbidopa/levodopa 50/200 at bedtime  -Continue rasagiline, 1 mg daily  -Has moderate Parkinson's dyskinesia, but really not bothersome to her and she does not even notice it most of the time.  Therefore, no treatment required.  -discussed importance of increasing exercise  2.  RBD/insomnia  -she is no longer on klonopin.  PCP prescribing belsomra.   3.  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 4.  Atrial fibrillation  -Follows with cardiology.  On Eliquis.  -Patient with history of tachybradycardia syndrome and follows with cardiology for this as well.  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   Subjective:   [name] [name] was seen today in follow up for Parkinsons disease.  My previous records were reviewed prior to todays visit as well as outside records available to me.  She is with her husband who supplements the history.  She had a fall on January 24.  She reports that ***.  Medical records indicate that she also had been experiencing more shortness of breath.  She has been following with Dr. Aundra Dubin for that.  Shortness of breath got worse after the fall.  She was noted to have pulmonary hypertension and acute on chronic congestive heart failure.  She underwent right heart catheterization February 1 and TEE yesterday because of symptoms.  She has also been following with hematology regarding anemia.   Current prescribed movement disorder medications: Carbidopa/levodopa 25/100, 2 tablets 3 times per day (increased) Carbidopa/levodopa 50/200 at bedtime azilect Klonopin, 1/2 po q hs (hasn't filled since 10/2020 per pdmp)    ALLERGIES:   Allergies  Allergen Reactions   Amiodarone Other (See Comments)    Made pt feel terrible    Statins     Leg weakness - but tolerating low dose  Crestor every other day    CURRENT MEDICATIONS:  Outpatient Encounter Medications as of 10/15/2021  Medication Sig   Acetylcysteine (NAC 600) 600 MG CAPS Take 600 mg by mouth daily.   apixaban (ELIQUIS) 5 MG TABS tablet TAKE 1 TABLET BY MOUTH TWICE A DAY   BELSOMRA 20 MG TABS Take 1 tablet by mouth at bedtime as needed (sleep).   CALCIUM PO Take 1 tablet by mouth at bedtime.   carbidopa-levodopa (SINEMET CR) 50-200 MG tablet TAKE 1 TABLET BY MOUTH EVERYDAY AT BEDTIME   carbidopa-levodopa (SINEMET IR) 25-100 MG tablet TAKE 2 TABLETS BY MOUTH 3 TIMES DAILY.   cholecalciferol (VITAMIN D3) 25 MCG (1000 UNIT) tablet Take 1,000 Units by mouth daily.   cyanocobalamin (,VITAMIN B-12,) 1000 MCG/ML injection INJECT 1ML INTRAMUSCULARLY IN THE DELTOID ONCE A MONTH (ALTERNATING DELT. EACH MONTH)   docusate sodium (COLACE) 100 MG capsule Take 100 mg by mouth in the morning and at bedtime.   ezetimibe (ZETIA) 10 MG tablet Take 10 mg by mouth every other day. Taking the same day as the crestor   furosemide (LASIX) 80 MG tablet Take 80 mg by mouth daily.   levothyroxine (SYNTHROID) 50 MCG tablet Take 50-75 mcg by mouth See admin instructions. Pt take 1 1/2 tablet (75 mcg) on Monday and Thursday and 1 tablet (50 mcg)  the rest of the week   magnesium oxide (MAG-OX) 400 MG tablet Take 400 mg by mouth at bedtime.   metoprolol succinate (TOPROL-XL) 25 MG 24 hr tablet TAKE 1  TABLET BY MOUTH EVERY DAY   multivitamin-lutein (OCUVITE-LUTEIN) CAPS capsule Take 1 capsule by mouth daily.   NON FORMULARY Take 2 tablets by mouth daily. Liposomal Glutathione take   OVER THE COUNTER MEDICATION Take 1 capsule by mouth daily. MetaCalm   polycarbophil (FIBERCON) 625 MG tablet Take 1,250 mg by mouth daily.   potassium chloride (KLOR-CON M) 10 MEQ tablet Take 2 tablets (20 mEq total) by mouth every morning AND 1 tablet (10 mEq total) every evening.   Probiotic Product (PROBIOTIC-10 PO) Take 1 tablet by mouth daily. Lafonda Mosses Brand    Propylene Glycol (SYSTANE BALANCE) 0.6 % SOLN Place 1 drop into both eyes daily as needed (dry eyes).   raloxifene (EVISTA) 60 MG tablet Take 60 mg by mouth daily.   rasagiline (AZILECT) 1 MG TABS tablet Take 1 tablet (1 mg total) by mouth daily.   rosuvastatin (CRESTOR) 5 MG tablet Take 2.5 mg by mouth every other day.   valACYclovir (VALTREX) 1000 MG tablet Take 1,000 mg by mouth 2 (two) times daily as needed (fever blisters).   vitamin B-12 (CYANOCOBALAMIN) 100 MCG tablet Take 100 mcg by mouth daily.   vitamin C (ASCORBIC ACID) 500 MG tablet Take 500 mg by mouth daily.   No facility-administered encounter medications on file as of 10/15/2021.    Objective:   PHYSICAL EXAMINATION:    VITALS:   Vitals:   10/15/21 0924  BP: 114/60  Pulse: 70  SpO2: 96%  Weight: 131 lb 9.6 oz (59.7 kg)  Height: 5\' 1"  (1.549 m)   Wt Readings from Last 3 Encounters:  10/15/21 131 lb 9.6 oz (59.7 kg)  09/11/21 128 lb 12.8 oz (58.4 kg)  07/31/21 126 lb 3.2 oz (57.2 kg)       GEN:  The patient appears stated age and is in NAD. HEENT:  Normocephalic, atraumatic.  The mucous membranes are moist. The superficial temporal arteries are without ropiness or tenderness. CV:  RRR Lungs:  CTAB Neck/HEME:  There are no carotid bruits bilaterally.  Neurological examination:  Orientation: The patient is alert and oriented x3. Cranial nerves: There is good facial symmetry with facial hypomimia. The speech is fluent and clear. Soft palate rises symmetrically and there is no tongue deviation. Hearing is intact to conversational tone. Sensation: Sensation is intact to light touch throughout Motor: Strength is at least antigravity x4.  Movement examination: Tone: There is nl tone in the ue/le Abnormal movements: mod dyskinesia on the L.  Some tongue dyskinesia Coordination:  There is no decremation today Gait and Station: The patient's stride length is good.  Ambulates with good speed and good arm swing.   Min head dyskinesia with ambulation.  The patient has a neg pull test.       Total time spent on today's visit was ***  minutes, including both face-to-face time and nonface-to-face time.  Time included that spent on review of records (prior notes available to me/labs/imaging if pertinent), discussing treatment and goals, answering patient's questions and coordinating care.  Cc:  Crist Infante, MD

## 2022-04-23 ENCOUNTER — Ambulatory Visit (HOSPITAL_COMMUNITY): Payer: Medicare Other | Admitting: Certified Registered Nurse Anesthetist

## 2022-04-23 ENCOUNTER — Encounter (HOSPITAL_COMMUNITY): Payer: Self-pay | Admitting: Cardiology

## 2022-04-23 ENCOUNTER — Ambulatory Visit (HOSPITAL_BASED_OUTPATIENT_CLINIC_OR_DEPARTMENT_OTHER): Payer: Medicare Other | Admitting: Certified Registered Nurse Anesthetist

## 2022-04-23 ENCOUNTER — Ambulatory Visit (HOSPITAL_COMMUNITY)
Admission: RE | Admit: 2022-04-23 | Discharge: 2022-04-23 | Disposition: A | Discharge status: Home / Self Care | Payer: Medicare Other | Attending: Cardiology | Admitting: Cardiology

## 2022-04-23 ENCOUNTER — Ambulatory Visit (HOSPITAL_BASED_OUTPATIENT_CLINIC_OR_DEPARTMENT_OTHER)
Admission: RE | Admit: 2022-04-23 | Discharge: 2022-04-23 | Disposition: A | Discharge status: Home / Self Care | Payer: Medicare Other | Source: Ambulatory Visit | Attending: Cardiology | Admitting: Cardiology

## 2022-04-23 ENCOUNTER — Other Ambulatory Visit: Payer: Self-pay

## 2022-04-23 ENCOUNTER — Encounter (HOSPITAL_COMMUNITY)
Admission: RE | Disposition: A | Discharge status: Home / Self Care | Payer: Self-pay | Source: Home / Self Care | Attending: Cardiology

## 2022-04-23 DIAGNOSIS — I081 Rheumatic disorders of both mitral and tricuspid valves: Secondary | ICD-10-CM | POA: Insufficient documentation

## 2022-04-23 DIAGNOSIS — I340 Nonrheumatic mitral (valve) insufficiency: Secondary | ICD-10-CM

## 2022-04-23 DIAGNOSIS — I361 Nonrheumatic tricuspid (valve) insufficiency: Secondary | ICD-10-CM

## 2022-04-23 DIAGNOSIS — G20A1 Parkinson's disease without dyskinesia, without mention of fluctuations: Secondary | ICD-10-CM | POA: Diagnosis not present

## 2022-04-23 DIAGNOSIS — Q2112 Patent foramen ovale: Secondary | ICD-10-CM | POA: Diagnosis not present

## 2022-04-23 DIAGNOSIS — Z950 Presence of cardiac pacemaker: Secondary | ICD-10-CM | POA: Insufficient documentation

## 2022-04-23 DIAGNOSIS — I10 Essential (primary) hypertension: Secondary | ICD-10-CM

## 2022-04-23 DIAGNOSIS — Z79899 Other long term (current) drug therapy: Secondary | ICD-10-CM | POA: Insufficient documentation

## 2022-04-23 DIAGNOSIS — I480 Paroxysmal atrial fibrillation: Secondary | ICD-10-CM | POA: Insufficient documentation

## 2022-04-23 DIAGNOSIS — D638 Anemia in other chronic diseases classified elsewhere: Secondary | ICD-10-CM | POA: Diagnosis not present

## 2022-04-23 DIAGNOSIS — Z7901 Long term (current) use of anticoagulants: Secondary | ICD-10-CM | POA: Diagnosis not present

## 2022-04-23 DIAGNOSIS — I071 Rheumatic tricuspid insufficiency: Secondary | ICD-10-CM

## 2022-04-23 DIAGNOSIS — I272 Pulmonary hypertension, unspecified: Secondary | ICD-10-CM | POA: Diagnosis not present

## 2022-04-23 DIAGNOSIS — I5032 Chronic diastolic (congestive) heart failure: Secondary | ICD-10-CM | POA: Insufficient documentation

## 2022-04-23 DIAGNOSIS — I110 Hypertensive heart disease with heart failure: Secondary | ICD-10-CM | POA: Insufficient documentation

## 2022-04-23 DIAGNOSIS — I495 Sick sinus syndrome: Secondary | ICD-10-CM | POA: Insufficient documentation

## 2022-04-23 DIAGNOSIS — E039 Hypothyroidism, unspecified: Secondary | ICD-10-CM | POA: Diagnosis not present

## 2022-04-23 DIAGNOSIS — K7460 Unspecified cirrhosis of liver: Secondary | ICD-10-CM | POA: Insufficient documentation

## 2022-04-23 DIAGNOSIS — I509 Heart failure, unspecified: Secondary | ICD-10-CM | POA: Diagnosis not present

## 2022-04-23 HISTORY — PX: TEE WITHOUT CARDIOVERSION: SHX5443

## 2022-04-23 LAB — ECHO TEE
MV M vel: 3.63 m/s
MV Peak grad: 52.6 mmHg
Radius: 0.8 cm

## 2022-04-23 SURGERY — ECHOCARDIOGRAM, TRANSESOPHAGEAL
Anesthesia: Monitor Anesthesia Care

## 2022-04-23 MED ORDER — PROPOFOL 500 MG/50ML IV EMUL
INTRAVENOUS | Status: DC | PRN
  Administered 2022-04-23: 125 ug/kg/min via INTRAVENOUS

## 2022-04-23 MED ORDER — PHENYLEPHRINE 80 MCG/ML (10ML) SYRINGE FOR IV PUSH (FOR BLOOD PRESSURE SUPPORT)
PREFILLED_SYRINGE | INTRAVENOUS | Status: DC | PRN
  Administered 2022-04-23: 120 ug via INTRAVENOUS
  Administered 2022-04-23: 160 ug via INTRAVENOUS

## 2022-04-23 MED ORDER — SODIUM CHLORIDE 0.9 % IV SOLN: INTRAVENOUS | Status: DC

## 2022-04-23 MED ORDER — PROPOFOL 10 MG/ML IV BOLUS
INTRAVENOUS | Status: DC | PRN
  Administered 2022-04-23: 20 mg via INTRAVENOUS

## 2022-04-23 NOTE — Anesthesia Preprocedure Evaluation (Addendum)
Anesthesia Evaluation  Patient identified by MRN, date of birth, ID band Patient awake    Reviewed: Allergy & Precautions, NPO status , Patient's Chart, lab work & pertinent test results  History of Anesthesia Complications (+) PONV and history of anesthetic complications  Airway Mallampati: III  TM Distance: >3 FB Neck ROM: Full    Dental  (+) Dental Advisory Given   Pulmonary shortness of breath   breath sounds clear to auscultation       Cardiovascular hypertension, +CHF  + dysrhythmias + pacemaker + Valvular Problems/Murmurs  Rhythm:Regular  1. Left ventricular ejection fraction, by estimation, is 55 to 60%. The  left ventricle has normal function. The left ventricle has no regional  wall motion abnormalities. Left ventricular diastolic parameters are  indeterminate.   2. Right ventricular systolic function is normal. The right ventricular  size is normal.   3. Left atrial size was severely dilated.   4. Right atrial size was severely dilated.   5. The mitral valve is normal in structure. Mild mitral valve  regurgitation. No evidence of mitral stenosis.   6. Tricuspid valve regurgitation is moderate to severe.   7. The aortic valve is tricuspid. Aortic valve regurgitation is trivial.  No aortic stenosis is present.   8. The inferior vena cava is dilated in size with <50% respiratory  variability, suggesting right atrial pressure of 15 mmHg.     Neuro/Psych negative neurological ROS  negative psych ROS   GI/Hepatic ,GERD  ,,  Endo/Other  Hypothyroidism    Renal/GU      Musculoskeletal  (+) Arthritis ,    Abdominal   Peds  Hematology  (+) Blood dyscrasia, anemia   Anesthesia Other Findings   Reproductive/Obstetrics                             Anesthesia Physical Anesthesia Plan  ASA: 3  Anesthesia Plan: MAC   Post-op Pain Management: Minimal or no pain anticipated    Induction: Intravenous  PONV Risk Score and Plan: 3 and Treatment may vary due to age or medical condition  Airway Management Planned: Nasal Cannula, Natural Airway and Simple Face Mask  Additional Equipment: None  Intra-op Plan:   Post-operative Plan:   Informed Consent: I have reviewed the patients History and Physical, chart, labs and discussed the procedure including the risks, benefits and alternatives for the proposed anesthesia with the patient or authorized representative who has indicated his/her understanding and acceptance.     Dental advisory given  Plan Discussed with: CRNA  Anesthesia Plan Comments:        Anesthesia Quick Evaluation

## 2022-04-23 NOTE — Interval H&P Note (Signed)
History and Physical Interval Note:  04/23/2022 1:49 PM  [name] [name]  has presented today for surgery, with the diagnosis of EVAL FOR TRICUSPID VALVE REGURGITATION.  The various methods of treatment have been discussed with the patient and family. After consideration of risks, benefits and other options for treatment, the patient has consented to  Procedure(s): TRANSESOPHAGEAL ECHOCARDIOGRAM (TEE) (N/A) as a surgical intervention.  The patient's history has been reviewed, patient examined, no change in status, stable for surgery.  I have reviewed the patient's chart and labs.  Questions were answered to the patient's satisfaction.     Sheriff Rodenberg [employer]

## 2022-04-23 NOTE — Anesthesia Procedure Notes (Signed)
Procedure Name: MAC Date/Time: 04/23/2022 1:46 PM  Performed by: Inda Coke, CRNAPre-anesthesia Checklist: Patient identified, Emergency Drugs available, Suction available, Timeout performed and Patient being monitored Patient Re-evaluated:Patient Re-evaluated prior to induction Oxygen Delivery Method: Nasal cannula Induction Type: IV induction Dental Injury: Teeth and Oropharynx as per pre-operative assessment

## 2022-04-23 NOTE — Transfer of Care (Signed)
Immediate Anesthesia Transfer of Care Note  Patient: [name] [name]  Procedure(s) Performed: TRANSESOPHAGEAL ECHOCARDIOGRAM (TEE)  Patient Location: Endoscopy Unit  Anesthesia Type:MAC  Level of Consciousness: awake and drowsy  Airway & Oxygen Therapy: Patient Spontanous Breathing  Post-op Assessment: Report given to RN and Post -op Vital signs reviewed and stable  Post vital signs: Reviewed and stable  Last Vitals:  Vitals Value Taken Time  BP    Temp    Pulse    Resp    SpO2      Last Pain:  Vitals:   04/23/22 1319  TempSrc: Temporal  PainSc: 0-No pain         Complications: No notable events documented.

## 2022-04-23 NOTE — CV Procedure (Signed)
Procedure: TEE  Sedation: Per anesthesiology  Indication: Tricuspid regurgitation  Findings: Normal LV size and systolic function, EF 0000000.  Normal wall thickness.  Normal wall motion.  Mild RV enlargement with mildly decreased systolic function.  There is severe TR with ERO 0.43 cm^2, regurgitant volume 50 cc.  There is a pacemaker in the right heart.  It is not fused to the tricuspid leaflets and moves independently.  IVC dilated at 2.1 cm.  There is systolic flow reversal in the hepatic vein doppler pattern.  Moderate right atrial enlargement.  Small PFO detected by bubble study.  Moderate left atrial enlargement.  No LA thrombus, the LA appendage is very small.  Mild mitral regurgitation.  Tricuspid aortic valve with no stenosis or regurgitation.  Normal caliber thoracic aorta with minimal plaque.   Review for tTEER.   Loralie Champagne 04/23/2022 2:22 PM

## 2022-04-24 ENCOUNTER — Encounter (HOSPITAL_COMMUNITY): Payer: Self-pay | Admitting: Cardiology

## 2022-04-24 ENCOUNTER — Ambulatory Visit (INDEPENDENT_AMBULATORY_CARE_PROVIDER_SITE_OTHER): Payer: Medicare Other | Admitting: Neurology

## 2022-04-24 VITALS — BP 116/72 | HR 61 | Ht 63.0 in | Wt 125.2 lb

## 2022-04-24 DIAGNOSIS — W19XXXA Unspecified fall, initial encounter: Secondary | ICD-10-CM | POA: Diagnosis not present

## 2022-04-24 DIAGNOSIS — G20B2 Parkinson's disease with dyskinesia, with fluctuations: Secondary | ICD-10-CM

## 2022-04-24 NOTE — Patient Instructions (Signed)
Local and Online Resources for Power over Parkinson's Group  March 2024   LOCAL [place] PARKINSON'S GROUPS   Power over Parkinson's Group:    Power Over Parkinson's Patient Education Group will be Wednesday, March 13th-*Hybrid meting*- in person at [place] location and via WEBEX, 2:00-3:00 pm.   Starting in November 2023, Power over Parkinson's and Care Partner Groups will meet together, with plans for separate break out session for caregivers (*this will be evolving over the next few months) Upcoming Power over Parkinson's Meetings/Care Partner Support:  2nd Wednesdays of the month at 2 pm:  March 13th, April 10th Contact Amy Marriott at [email or web address] if interested in participating in this group    LOCAL EVENTS AND NEW OFFERINGS  Let's Try Pickleball-$25 for 6 weeks of Pickleball, starting February 2nd.  Contact Sarah Chambers for more details.  [email or web address] NEW:  Parkinson's Social Game Night.  First Thursday of each month, 2:00-4:00 pm.  *Next date is MARCH 7th*.  [facility], [place].  Contact [email or web address] if interested. Parkinson's CarePartner Group for Men is in the works, if interested email Sarah  [email or web address] ACT [employer] Chair Yoga classes "Train and Gain", Fridays 10 am, [employer].  Contact Gina at [phone].  PWR! Moves Community Fitness Instructor-Led Classes offering at [employer]!  TUESDAYS and Wednesdays 1-2 pm.   Contact Christy Weaver at  [email or web address]  or [phone] (Tuesday classes are modified for chair and standing only) Drumming for Parkinson's will be held on 2nd and 4th Mondays at 11:00 am.   Located at the [employer] ([address] [place]  Contact Jane Maydian at [email or web address] or [phone]  Dance for Parkinson 's classes will be on Tuesdays 10-11 am starting in February. Located in the [employer] [facility], in the first floor of the [facility] ([address] To register:  [email or web address] or [phone] [employer] Parkinson's Tai Chi Class, Mondays at 11 am.  Call [phone] for details Moving Day [place].  Saturday, May 4th, 10 am start.  Register at [email or web address]    ONLINE EDUCATION AND SUPPORT  Parkinson Foundation:  www.parkinson.org  PD Health at Home continues:  Mindfulness Mondays, Wellness Wednesdays, Fitness Fridays  (PWR! Moves as part of Fitness Fridays March 22nd, 1-1:45 pm) Upcoming Education:   Managing "Off" Periods:  Return of Parkinson's Symptoms.  Wednesday, March 20th, 1-2 pm Parkinson's 101.  Wednesday, April 3rd, 1-2 pm Expert Briefing:  Understanding Pain in Parkinson's.   Wednesday, March 13th, 1-2 pm  Research Update:  Working to Halt PD.  Wednesday, April 10th, 1-2 pm Register for virtual education and expert briefings (webinars) at www.parkinson.org/resources-support/online-education Please check out their website to sign up for emails and see their full online offerings     Michael J Fox Foundation:  www.michaeljfox.org   Third Thursday Webinars:  On the third Thursday of every month at 12 p.m. ET, join our free live webinars to learn about various aspects of living with Parkinson's disease and our work to speed medical breakthroughs.  Upcoming Webinar:  Everyday Exposure to Parkinson's:  Environmental Connections to the Disease.  Thursday, March 21st at 12 noon. Check out additional information on their website to see their full online offerings    Davis Phinney Foundation:  www.davisphinneyfoundation.org  Upcoming Webinar:   Nutrition and Parkinson's.  Wednesday, March 6th, 12 noon Webinar Series:  Living with Parkinson's Meetup.   Third Thursdays each month, 3 pm  Care Partner Monthly   Meetup.  With Connie Carpenter Phinney.  First Tuesday of each month, 2 pm  Check out additional information  to Live Well Today on their website    Parkinson and Movement Disorders (PMD) Alliance:  www.pmdalliance.org  [employer] Online:  Online Education Events  Sign up for emails, which are sent weekly to give you updates on programming and online offerings    Parkinson's Association of [place]:  www.parkinsonassociation.org  Information on online support groups, education events, and online exercises including Yoga, Parkinson's exercises and more-LOTS of information on links to PD resources and online events  Virtual Support Group through Parkinson's Association of [place]; next one is scheduled for Wednesday, March 6th  MOVEMENT AND EXERCISE OPPORTUNITIES  PWR! Moves Classes at [facility].  Wednesdays 10 and 11 am.   Contact Amy Marriott, PT [email or web address] if interested.  PWR! Moves Class offerings at [employer]. *TUESDAYS* and Wednesdays 1-2 pm.    Contact Christy Weaver at  [email or web address]    Parkinson's Wellness Recovery (PWR! Moves)  www.pwr4life.org  Info on the PWR! Virtual Experience:  You will have access to our expertise?through self-assessment, guided plans that start with the PD-specific fundamentals, educational content, tips, Q&A with an expert, and a growing library of PD-specific pre-recorded and live exercise classes of varying types and intensity - both physical and cognitive! If that is not enough, we offer 1:1 wellness consultations (in-person or virtual) to personalize your PWR! Virtual Experience.   Parkinson Foundation Fitness Fridays:   As part of the PD Health @ Home program, this free video series focuses each week on one aspect of fitness designed to support people living with Parkinson's.? These weekly videos highlight the Parkinson Foundation fitness guidelines for people with Parkinson's disease.  www.parkinson.org/resources-support/online-education/pdhealth#ff  Dance for PD website is offering free, live-stream  classes throughout the week, as well as links to digital library of classes:  [email or web address]://danceforparkinsons.org/  Virtual dance and Pilates for Parkinson's classes: Click on the Community Tab> Parkinson's Movement Initiative Tab.  To register for classes and for more information, visit www.americandancefestival.org and click the "community" tab.   [employer] Parkinson's Cycling Classes   [employer]:  Thursdays @ Noon-Live classes at [employer] (Contact Margaret Hazen at [email or web address]?or [phone])  [employer]: Virtual Classes Mondays and Thursdays /Live classes Tuesday, Wednesday and Thursday (contact Marlee at [email or web address] ?or [phone])  [place] Steady Boxing  Varied levels of classes are offered Tuesdays and Thursdays at [employer] [facility] [facility].   Stretching with Maria weekly class is also offered for people with Parkinson's  To observe a class or for more information, call [phone] or email Hillary Savage at [email or web address]   ADDITIONAL SUPPORT AND RESOURCES  [employer]:Online Caregiver Education Opportunities:  www.well-springsolutions.org/caregiver-education/caregiver-support-group.  You may also contact Jodi Kolada at [email or web address] or [phone].     Family Caregiver [facility].  Thursday, March 7th, 10:15-1:45 at [facility].  Register with Jodi Kolada (see above) Well-Spring Navigator:  Just1Navigator program, a?free service to help individuals and families through the journey of determining care for older adults.  The "Navigator" is a social worker, Nicole Reynolds, who will speak with a prospective client and/or loved ones to provide an assessment of the situation and a set of recommendations for a personalized care plan -- all free of charge, and whether?[employer] offers the needed service or not. If the need is not a service we provide, we are well-connected with reputable programs in  town that we can refer you to.  www.well-springsolutions.org or   to speak with the Navigator, call [phone].     

## 2022-04-28 ENCOUNTER — Other Ambulatory Visit: Payer: Self-pay | Admitting: Neurology

## 2022-04-28 DIAGNOSIS — G20A1 Parkinson's disease without dyskinesia, without mention of fluctuations: Secondary | ICD-10-CM

## 2022-04-28 DIAGNOSIS — G20B1 Parkinson's disease with dyskinesia, without mention of fluctuations: Secondary | ICD-10-CM

## 2022-04-28 NOTE — Anesthesia Postprocedure Evaluation (Signed)
Anesthesia Post Note  Patient: [name] [name]  Procedure(s) Performed: TRANSESOPHAGEAL ECHOCARDIOGRAM (TEE)     Patient location during evaluation: Endoscopy Anesthesia Type: MAC Level of consciousness: awake and alert Pain management: pain level controlled Vital Signs Assessment: post-procedure vital signs reviewed and stable Respiratory status: spontaneous breathing, nonlabored ventilation and respiratory function stable Cardiovascular status: stable Postop Assessment: no apparent nausea or vomiting Anesthetic complications: no   No notable events documented.  Last Vitals:  Vitals:   04/23/22 1435 04/23/22 1445  BP: 113/74 101/64  Pulse: 85 85  Resp: 16 (!) 23  Temp:    SpO2: 98% 96%    Last Pain:  Vitals:   04/23/22 1445  TempSrc:   PainSc: 0-No pain                 Addiel Mccardle

## 2022-04-29 DIAGNOSIS — H43812 Vitreous degeneration, left eye: Secondary | ICD-10-CM | POA: Diagnosis not present

## 2022-04-29 DIAGNOSIS — H353211 Exudative age-related macular degeneration, right eye, with active choroidal neovascularization: Secondary | ICD-10-CM | POA: Diagnosis not present

## 2022-04-29 DIAGNOSIS — H353132 Nonexudative age-related macular degeneration, bilateral, intermediate dry stage: Secondary | ICD-10-CM | POA: Diagnosis not present

## 2022-04-29 DIAGNOSIS — H35721 Serous detachment of retinal pigment epithelium, right eye: Secondary | ICD-10-CM | POA: Diagnosis not present

## 2022-04-29 DIAGNOSIS — H353221 Exudative age-related macular degeneration, left eye, with active choroidal neovascularization: Secondary | ICD-10-CM | POA: Diagnosis not present

## 2022-05-02 ENCOUNTER — Telehealth (HOSPITAL_COMMUNITY): Payer: Self-pay

## 2022-05-02 NOTE — Telephone Encounter (Signed)
error 

## 2022-05-02 NOTE — Telephone Encounter (Addendum)
Referral sent to Dr. Bridgette Habermann in [place] also left message for Lurline Hare in the office. Disc of TEE mailed to office, patient informed of referral been sent

## 2022-05-20 ENCOUNTER — Encounter (HOSPITAL_COMMUNITY): Payer: Self-pay | Admitting: Cardiology

## 2022-05-20 ENCOUNTER — Ambulatory Visit (HOSPITAL_COMMUNITY)
Admission: RE | Admit: 2022-05-20 | Discharge: 2022-05-20 | Disposition: A | Discharge status: Home / Self Care | Payer: Medicare Other | Source: Ambulatory Visit | Attending: Cardiology | Admitting: Cardiology

## 2022-05-20 VITALS — BP 100/60 | HR 84 | Wt 126.0 lb

## 2022-05-20 DIAGNOSIS — G20A1 Parkinson's disease without dyskinesia, without mention of fluctuations: Secondary | ICD-10-CM | POA: Insufficient documentation

## 2022-05-20 DIAGNOSIS — I480 Paroxysmal atrial fibrillation: Secondary | ICD-10-CM | POA: Diagnosis not present

## 2022-05-20 DIAGNOSIS — I272 Pulmonary hypertension, unspecified: Secondary | ICD-10-CM | POA: Diagnosis not present

## 2022-05-20 DIAGNOSIS — K7460 Unspecified cirrhosis of liver: Secondary | ICD-10-CM | POA: Diagnosis not present

## 2022-05-20 DIAGNOSIS — Z7901 Long term (current) use of anticoagulants: Secondary | ICD-10-CM | POA: Diagnosis not present

## 2022-05-20 DIAGNOSIS — I5032 Chronic diastolic (congestive) heart failure: Secondary | ICD-10-CM | POA: Diagnosis not present

## 2022-05-20 DIAGNOSIS — I110 Hypertensive heart disease with heart failure: Secondary | ICD-10-CM | POA: Insufficient documentation

## 2022-05-20 DIAGNOSIS — Z79899 Other long term (current) drug therapy: Secondary | ICD-10-CM | POA: Diagnosis not present

## 2022-05-20 DIAGNOSIS — Z950 Presence of cardiac pacemaker: Secondary | ICD-10-CM | POA: Diagnosis not present

## 2022-05-20 DIAGNOSIS — I495 Sick sinus syndrome: Secondary | ICD-10-CM | POA: Insufficient documentation

## 2022-05-20 DIAGNOSIS — I071 Rheumatic tricuspid insufficiency: Secondary | ICD-10-CM | POA: Insufficient documentation

## 2022-05-20 LAB — BASIC METABOLIC PANEL
Anion gap: 12 (ref 5–15)
BUN: 23 mg/dL (ref 8–23)
CO2: 27 mmol/L (ref 22–32)
Calcium: 9.4 mg/dL (ref 8.9–10.3)
Chloride: 97 mmol/L — ABNORMAL LOW (ref 98–111)
Creatinine, Ser: 1 mg/dL (ref 0.44–1.00)
GFR, Estimated: 57 mL/min — ABNORMAL LOW (ref >60)
Glucose, Bld: 82 mg/dL (ref 70–99)
Potassium: 4.3 mmol/L (ref 3.5–5.1)
Sodium: 136 mmol/L (ref 135–145)

## 2022-05-20 LAB — BRAIN NATRIURETIC PEPTIDE: B Natriuretic Peptide: 82.9 pg/mL (ref 0.0–100.0)

## 2022-05-20 MED ORDER — SPIRONOLACTONE 25 MG PO TABS: 25.0000 mg | ORAL_TABLET | Freq: Every day | ORAL | 3 refills | Status: DC

## 2022-05-20 NOTE — Progress Notes (Signed)
PCP: Rodrigo Ran, MD EP: Dr. Elberta Fortis HF Cardiology: Dr. Shirlee Latch  82 y.o. 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  Patient had an echo done in 3/23 showing EF 65-70%, mild LVH, normal RV, PASP 60 mmHg, moderate biatrial enlargement, mild MR, moderate TR. Prior echoes had shown mildly elevated PA pressure. Cardiolite in 10/21 showed no ischemia.   RHC was done in 5/23 showing mild pulmonary venous hypertension with mildly elevated PCWP. She was started on Farxiga.  She developed a persistent UTI that was difficult to clear and stopped [place].   Acute follow up 02/20/22 for dyspnea. CTA chest showed no PE, moderate right pleural effusion, mild pulmonary edema. NYHA III symptoms and volume overloaded, she was given IV lasix 80 mg x 1 then instructed to use [name] daily x 2 days, then switch from Lasix to torsemide 60 mg daily. RHC arranged which showed elevated R/L heart filling pressures, prominent V waves in PCWP tracing, preserved CO and moderate pulmonary venous hypertension. Echo arranged to assess MR and torsemide increased to 60/40.  S/p R therapeutic thoracentesis 02/25/22 yielding 350 ml fluid.  Echo 2/24 showed EF 55-60%, normal RV, mild MR, moderate-severe TR with dilated IVC.  TEE in 3/24 showed EF 55-60%, mild RV enlargement with mildly decreased RV systolic function, mild MR, severe TR (PPM lead not fused to leaflets), small PFO.   Today she returns with her husband for followup of CHF and severe TR.  She has been doing better over the last few months.  Weight is down 1 lb.  She was in [place] recently and did a lot of walking.  She is only dyspneic with inclines/stairs.  No orthopnea/PND.  No lightheadedness or palpitations.    ECG (personally reviewed): A-V dual pacing  Medtronic device interrogation: rare  atrial fibrillation, 97% v-paced  Labs (3/23): hgb 9.7 Labs (4/23): K 3.2, creatinine 0.92 Labs (6/23): creatinine 0.8  Labs (11/23): K 3.5, creatinine 0.7 Labs (1/24): K 4.1, creatinine 0.85 Labs (2/24): K 3.7, creatinine 0.95 Labs (3/24): K 3.3, creatinine 0.93, hgb 10.8  PMH: 1. Atrial fibrillation: Paroxysmal.  S/p AF (epicardial and endocardial) ablation in 10/13.  2. Tachy-brady syndrome: Medtronic PPM placed in 9/14.  3. H/o atypical atrial flutter 4. Hyperlipidemia 5. HTN 6. Hypothyroidism 7. Parkinsons disease: Followed by Dr. Arbutus Leas 8. Cirrhosis: ?NAFLD. No ETOH.  9. Chronic diastolic CHF/pulmonary hypertension:  - LHC (2014): No significant CAD.  - Cardiolite (10/21): EF 71%, no ischemia.  - Echo (3/23): EF 65-70%, mild LVH, normal RV, PASP 60 mmHg, moderate biatrial enlargement, mild MR, moderate TR.  - RHC (5/23): mean RA 8, PA 49/13 mean 30, mean PCWP 19 with v-waves to 30, CI 3.96, PVR 1.8 WU.  - RHC (2/24): RA mean 12, PA 53/24 (mean 39) PCWP mean 23 with prominent v-waves to 35, CO/CI (Fick) 6.75/4.23, PVR 2.4 WU - Echo (2/24): EF 55-60%, normal RV, mild MR, moderate-severe TR with dilated IVC. - TEE (3/24): EF 55-60%, mild RV enlargement with mildly decreased RV systolic function, mild MR, severe TR (PPM lead not fused to leaflets), small PFO.  10. B12 deficiency.  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   Social History   Socioeconomic History   Marital  status: Married    Spouse name: Not on file   Number of children: 2   Years of education: Not on file   Highest education level: Master's degree (e.g., MA, MS, MEng, MEd, MSW, MBA)  Occupational History   Not on file  Tobacco Use   Smoking status: Never   Smokeless tobacco: Never  Vaping Use   Vaping Use: Never used  Substance and Sexual Activity   Alcohol use: No   Drug use:  No   Sexual activity: Yes    Partners: Male    Birth control/protection: Post-menopausal  Other Topics Concern   Not on file  Social History Narrative   Lives in [place].  Retired Comptroller.   Right handed    Social Determinants of Health   Financial Resource Strain: Not on file  Food Insecurity: Not on file  Transportation Needs: Not on file  Physical Activity: Not on file  Stress: Not on file  Social Connections: Not on file  Intimate Partner Violence: Not on file   Family History  Problem Relation Age of Onset   Alzheimer's disease Mother    Heart failure Father    Healthy Child    Healthy Child    Breast cancer Maternal Aunt    ROS: All systems reviewed and negative except as per HPI.   Current Outpatient Medications  Medication Sig Dispense Refill   acetaminophen (TYLENOL) 500 MG tablet Take 1,000 mg by mouth at bedtime as needed for mild pain, moderate pain or headache.     Acetylcysteine (NAC 600) 600 MG CAPS Take 600 mg by mouth in the morning.     apixaban (ELIQUIS) 5 MG TABS tablet Take 1 tablet (5 mg total) by mouth 2 (two) times daily. 180 tablet 1   BELSOMRA 20 MG TABS Take 20 mg by mouth at bedtime as needed (sleep).     Calcium Citrate-Vitamin D (CITRACAL + D PO) Take 1 tablet by mouth every evening.     carbidopa-levodopa (SINEMET CR) 50-200 MG tablet TAKE ONE TABLET BY MOUTH EVERYDAY AT BEDTIME 90 tablet 0   carbidopa-levodopa (SINEMET IR) 25-100 MG tablet TAKE 2 TABLETS BY MOUTH 3 TIMES DAILY. 540 tablet 1   cholecalciferol (VITAMIN D3) 25 MCG (1000 UNIT) tablet Take 1,000 Units by mouth in the morning.     cyanocobalamin (,VITAMIN B-12,) 1000 MCG/ML injection INJECT INTRAMUSCULARLY IN THE DELTOID ONCE A MONTH (ALTERNATING DELT. EACH MONTH)     Cyanocobalamin (VITAMIN B12) 1000 MCG TBCR Take 1,000 mcg by mouth in the morning.     docusate sodium (COLACE) 100 MG capsule Take 100 mg by mouth in the morning.     GLUTATHIONE PO Take 2 capsules by mouth  in the morning.     levothyroxine (SYNTHROID) 50 MCG tablet Take 50-100 mcg by mouth See admin instructions. Take 2 tablets (100 mcg) by mouth on Mondays and Thursdays in the morning. Take 1 tablet (50 mcg) by mouth on Sundays, Tuesdays, Wednesdays, Fridays & Saturdays.     magnesium oxide (MAG-OX) 400 MG tablet Take 400 mg by mouth at bedtime.     metoprolol succinate (TOPROL-XL) 25 MG 24 hr tablet TAKE 1 TABLET BY MOUTH EVERY DAY (Patient taking differently: Take 25 mg by mouth daily. TAKE 1 TABLET BY MOUTH EVERY DAY) 90 tablet 3   multivitamin-lutein (OCUVITE-LUTEIN) CAPS capsule Take 1 capsule by mouth in the morning.     NON FORMULARY Take 1 tablet by mouth at bedtime. MetaCalm Stress Regulation Formula  polycarbophil (FIBERCON) 625 MG tablet Take 1,250 mg by mouth in the morning.     potassium chloride (MICRO-K) 10 MEQ CR capsule Take 6 capsules (60 mEq total) by mouth 2 (two) times daily. 240 capsule 3   Probiotic Product (PROBIOTIC PO) Take 1 capsule by mouth every evening.     rasagiline (AZILECT) 1 MG TABS tablet TAKE ONE TABLET BY MOUTH ONCE DAILY 90 tablet 0   torsemide (DEMADEX) 20 MG tablet Take 3 tablets (60 mg total) by mouth daily with breakfast AND 2 tablets (40 mg total) every evening. 180 tablet 3   valACYclovir (VALTREX) 1000 MG tablet Take 1,000 mg by mouth 2 (two) times daily as needed (fever blisters).     vitamin C (ASCORBIC ACID) 500 MG tablet Take 500 mg by mouth in the morning.     spironolactone (ALDACTONE) 25 MG tablet Take 1 tablet (25 mg total) by mouth daily. 90 tablet 3   No current facility-administered medications for this encounter.   BP 100/60   Pulse 84   Wt 57.2 kg (126 lb)   LMP 02/04/1996   SpO2 99%   BMI 22.32 kg/m   Wt Readings from Last 3 Encounters:  05/20/22 57.2 kg (126 lb)  04/24/22 56.8 kg (125 lb 3.2 oz)  04/23/22 56.7 kg (125 lb)   General: NAD Neck: No JVD, no thyromegaly or thyroid nodule.  Lungs: Clear to auscultation  bilaterally with normal respiratory effort. CV: Nondisplaced PMI.  Heart regular S1/S2, no S3/S4, 1/6 HSM LLSB.  No peripheral edema.  No carotid bruit.  Normal pedal pulses.  Abdomen: Soft, nontender, no hepatosplenomegaly, no distention.  Skin: Intact without lesions or rashes.  Neurologic: Alert and oriented x 3.  Psych: Normal affect. Extremities: No clubbing or cyanosis.  HEENT: Normal.   Assessment/Plan: 1. Pulmonary hypertension:  RHC (1/24) showed moderate pulmonary venous hypertension with elevated PCWP (group 2 PH). 2. Chronic diastolic CHF: Echo in 3/23 with EF 65-70%, mild LVH, normal RV, PASP 60 mmHg, moderate biatrial enlargement, mild MR, moderate TR. RHC in 5/23 showed elevated PCWP with prominent v-waves.  Echo from 3/23 reviewed, no more than mild MR so suspect elevated v-waves due to diastolic dysfunction.  She had pulmonary venous hypertension. She underwent RHC again in 1/24 showing elevated R/L heart filling pressures, prominent V-waves in PCWP tracing, preserved CO and moderate pulmonary venous hypertension. Echo 2/24 showed stable EF 55-60%, normal RV, mild MR, moderate-severe TR with dilated IVC. TEE in 3/24 showed EF 55-60%, mild RV enlargement with mildly decreased RV systolic function, mild MR, severe TR (PPM lead not fused to leaflets), small PFO. She is not volume overloaded on exam today, NYHA class II symptoms. Doing better on current diuretic regimen.  - Increase spironolactone to 25 mg daily.  BMET/BNP today and BMET in 10 days.  - Continue torsemide 60 mg q am/40 mg q pm. - Off Farxiga due to persistent UTI.  3. Tricuspid regurgitation: Most recent echo 2/24 showed moderate to severe TR. TEE was done in 3/24, showing severe TR with ERO 0.43 cm^2 by PISA, regurgitant volume 50 cc, PPM does moves independently from leaflets and does not appear to be fused, there is mild RV dilation/mild RV dysfunction.  I suspect this plays a significant role in her CHF.  - I have  referred patient to [facility] to assess for candidacy for percutaneous tricuspid valve repair (Triclip or Evoque).  4. Atrial fibrillation: Paroxysmal. NSR today. She has periodic AF by device interrogation.  She has had an epicardial and endocardial ablation in the past.  She failed Tikosyn in the past and did not tolerate amiodarone due to side effects.  - Continue Eliquis.  - Continue Toprol XL.  - Ongoing discussions with Dr. Elberta Fortis regarding redo AF ablation.  As she is in AF only rarely now, may be reasonable to hold off until we see if TR can be addressed. There is not a good anti-arrhythmic option for her.  5. Tachy-brady syndrome: Has MDT PPM. Had gen change 07/17/21 6. Cirrhosis: ?NAFLD.  Does not drink any ETOH now, was not a heavy drinker before.  - Followed by [facility].   Will make sure her information has made it to the [facility] for TR evaluation.  Followup in 6 wks with APP.   Marca Ancona  05/20/2022

## 2022-05-20 NOTE — Progress Notes (Incomplete)
PCP: Rodrigo Ran, MD EP: Dr. Elberta Fortis HF Cardiology: Dr. Shirlee Latch  82 y.o. 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  Patient had an echo done in 3/23 showing EF 65-70%, mild LVH, normal RV, PASP 60 mmHg, moderate biatrial enlargement, mild MR, moderate TR. Prior echoes had shown mildly elevated PA pressure. Cardiolite in 10/21 showed no ischemia.   RHC was done in 5/23 showing mild pulmonary venous hypertension with mildly elevated PCWP. She was started on Farxiga.  She developed a persistent UTI that was difficult to clear and stopped [place].   Acute follow up 02/20/22 for dyspnea. CTA chest showed no PE, moderate right pleural effusion, mild pulmonary edema. NYHA III symptoms and volume overloaded, she was given IV lasix 80 mg x 1 then instructed to use [name] daily x 2 days, then switch from Lasix to torsemide 60 mg daily. RHC arranged which showed elevated R/L heart filling pressures, prominent V waves in PCWP tracing, preserved CO and moderate pulmonary venous hypertension. Echo arranged to assess MR and torsemide increased to 60/40.  S/p R therapeutic thoracentesis 02/25/22 yielding 350 ml fluid.  Echo 2/24 showed EF 55-60%, normal RV, mild MR, moderate-severe TR with dilated IVC.  TEE in 3/24 showed EF 55-60%, mild RV enlargement with mildly decreased RV systolic function, mild MR, severe TR (PPM lead not fused to leaflets), small PFO.   Today she returns with her husband for followup of CHF and severe TR.  She has been doing better over the last few months.  Weight is down 1 lb.  She was in [place] recently and did a lot of walking.  She is only dyspneic with inclines/stairs.  No orthopnea/PND.  No lightheadedness or palpitations.    ECG (personally reviewed): A-V dual pacing  Medtronic device interrogation: rare  atrial fibrillation, 97% v-paced  Labs (3/23): hgb 9.7 Labs (4/23): K 3.2, creatinine 0.92 Labs (6/23): creatinine 0.8  Labs (11/23): K 3.5, creatinine 0.7 Labs (1/24): K 4.1, creatinine 0.85 Labs (2/24): K 3.7, creatinine 0.95 Labs (3/24): K 3.3, creatinine 0.93, hgb 10.8  PMH: 1. Atrial fibrillation: Paroxysmal.  S/p AF (epicardial and endocardial) ablation in 10/13.  2. Tachy-brady syndrome: Medtronic PPM placed in 9/14.  3. H/o atypical atrial flutter 4. Hyperlipidemia 5. HTN 6. Hypothyroidism 7. Parkinsons disease: Followed by Dr. Arbutus Leas 8. Cirrhosis: ?NAFLD. No ETOH.  9. Chronic diastolic CHF/pulmonary hypertension:  - LHC (2014): No significant CAD.  - Cardiolite (10/21): EF 71%, no ischemia.  - Echo (3/23): EF 65-70%, mild LVH, normal RV, PASP 60 mmHg, moderate biatrial enlargement, mild MR, moderate TR.  - RHC (5/23): mean RA 8, PA 49/13 mean 30, mean PCWP 19 with v-waves to 30, CI 3.96, PVR 1.8 WU.  - RHC (2/24): RA mean 12, PA 53/24 (mean 39) PCWP mean 23 with prominent v-waves to 35, CO/CI (Fick) 6.75/4.23, PVR 2.4 WU - Echo (2/24): EF 55-60%, normal RV, mild MR, moderate-severe TR with dilated IVC. - TEE (3/24): EF 55-60%, mild RV enlargement with mildly decreased RV systolic function, mild MR, severe TR (PPM lead not fused to leaflets), small PFO.  10. B12 deficiency.  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   Social History   Socioeconomic History  . Marital  status: Married    Spouse name: Not on file  . Number of children: 2  . Years of education: Not on file  . Highest education level: Master's degree (e.g., MA, MS, MEng, MEd, MSW, MBA)  Occupational History  . Not on file  Tobacco Use  . Smoking status: Never  . Smokeless tobacco: Never  Vaping Use  . Vaping Use: Never used  Substance and Sexual Activity  . Alcohol use: No  .  Drug use: No  . Sexual activity: Yes    Partners: Male    Birth control/protection: Post-menopausal  Other Topics Concern  . Not on file  Social History Narrative   Lives in [place].  Retired Comptroller.   Right handed    Social Determinants of Health   Financial Resource Strain: Not on file  Food Insecurity: Not on file  Transportation Needs: Not on file  Physical Activity: Not on file  Stress: Not on file  Social Connections: Not on file  Intimate Partner Violence: Not on file   Family History  Problem Relation Age of Onset  . Alzheimer's disease Mother   . Heart failure Father   . Healthy Child   . Healthy Child   . Breast cancer Maternal Aunt    ROS: All systems reviewed and negative except as per HPI.   Current Outpatient Medications  Medication Sig Dispense Refill  . acetaminophen (TYLENOL) 500 MG tablet Take 1,000 mg by mouth at bedtime as needed for mild pain, moderate pain or headache.    . Acetylcysteine (NAC 600) 600 MG CAPS Take 600 mg by mouth in the morning.    [name] apixaban (ELIQUIS) 5 MG TABS tablet Take 1 tablet (5 mg total) by mouth 2 (two) times daily. 180 tablet 1  . BELSOMRA 20 MG TABS Take 20 mg by mouth at bedtime as needed (sleep).    . Calcium Citrate-Vitamin D (CITRACAL + D PO) Take 1 tablet by mouth every evening.    . carbidopa-levodopa (SINEMET CR) 50-200 MG tablet TAKE ONE TABLET BY MOUTH EVERYDAY AT BEDTIME 90 tablet 0  . carbidopa-levodopa (SINEMET IR) 25-100 MG tablet TAKE 2 TABLETS BY MOUTH 3 TIMES DAILY. 540 tablet 1  . cholecalciferol (VITAMIN D3) 25 MCG (1000 UNIT) tablet Take 1,000 Units by mouth in the morning.    . cyanocobalamin (,VITAMIN B-12,) 1000 MCG/ML injection INJECT INTRAMUSCULARLY IN THE DELTOID ONCE A MONTH (ALTERNATING DELT. EACH MONTH)    . Cyanocobalamin (VITAMIN B12) 1000 MCG TBCR Take 1,000 mcg by mouth in the morning.    . docusate sodium (COLACE) 100 MG capsule Take 100 mg by mouth in the morning.    [name] GLUTATHIONE  PO Take 2 capsules by mouth in the morning.    [name] levothyroxine (SYNTHROID) 50 MCG tablet Take 50-100 mcg by mouth See admin instructions. Take 2 tablets (100 mcg) by mouth on Mondays and Thursdays in the morning. Take 1 tablet (50 mcg) by mouth on Sundays, Tuesdays, Wednesdays, Fridays & Saturdays.    . magnesium oxide (MAG-OX) 400 MG tablet Take 400 mg by mouth at bedtime.    . metoprolol succinate (TOPROL-XL) 25 MG 24 hr tablet TAKE 1 TABLET BY MOUTH EVERY DAY (Patient taking differently: Take 25 mg by mouth daily. TAKE 1 TABLET BY MOUTH EVERY DAY) 90 tablet 3  . multivitamin-lutein (OCUVITE-LUTEIN) CAPS capsule Take 1 capsule by mouth in the morning.    . NON FORMULARY Take 1 tablet by mouth at bedtime. MetaCalm Stress Regulation Formula    .  polycarbophil (FIBERCON) 625 MG tablet Take 1,250 mg by mouth in the morning.    . potassium chloride (MICRO-K) 10 MEQ CR capsule Take 6 capsules (60 mEq total) by mouth 2 (two) times daily. 240 capsule 3  . Probiotic Product (PROBIOTIC PO) Take 1 capsule by mouth every evening.    . rasagiline (AZILECT) 1 MG TABS tablet TAKE ONE TABLET BY MOUTH ONCE DAILY 90 tablet 0  . torsemide (DEMADEX) 20 MG tablet Take 3 tablets (60 mg total) by mouth daily with breakfast AND 2 tablets (40 mg total) every evening. 180 tablet 3  . valACYclovir (VALTREX) 1000 MG tablet Take 1,000 mg by mouth 2 (two) times daily as needed (fever blisters).    . vitamin C (ASCORBIC ACID) 500 MG tablet Take 500 mg by mouth in the morning.    [name] spironolactone (ALDACTONE) 25 MG tablet Take 1 tablet (25 mg total) by mouth daily. 90 tablet 3   No current facility-administered medications for this encounter.   BP 100/60   Pulse 84   Wt 57.2 kg (126 lb)   LMP 02/04/1996   SpO2 99%   BMI 22.32 kg/m   Wt Readings from Last 3 Encounters:  05/20/22 57.2 kg (126 lb)  04/24/22 56.8 kg (125 lb 3.2 oz)  04/23/22 56.7 kg (125 lb)   General: NAD Neck: No JVD, no thyromegaly or thyroid  nodule.  Lungs: Clear to auscultation bilaterally with normal respiratory effort. CV: Nondisplaced PMI.  Heart regular S1/S2, no S3/S4, 1/6 HSM LLSB.  No peripheral edema.  No carotid bruit.  Normal pedal pulses.  Abdomen: Soft, nontender, no hepatosplenomegaly, no distention.  Skin: Intact without lesions or rashes.  Neurologic: Alert and oriented x 3.  Psych: Normal affect. Extremities: No clubbing or cyanosis.  HEENT: Normal.   Assessment/Plan: 1. Pulmonary hypertension:  RHC (1/24) showed moderate pulmonary venous hypertension with elevated PCWP (group 2 PH). 2. Chronic diastolic CHF: Echo in 3/23 with EF 65-70%, mild LVH, normal RV, PASP 60 mmHg, moderate biatrial enlargement, mild MR, moderate TR. RHC in 5/23 showed elevated PCWP with prominent v-waves.  Echo from 3/23 reviewed, no more than mild MR so suspect elevated v-waves due to diastolic dysfunction.  She had pulmonary venous hypertension. She underwent RHC again in 1/24 showing elevated R/L heart filling pressures, prominent V-waves in PCWP tracing, preserved CO and moderate pulmonary venous hypertension. Echo 2/24 showed stable EF 55-60%, normal RV, mild MR, moderate-severe TR with dilated IVC. TEE in 3/24 showed EF 55-60%, mild RV enlargement with mildly decreased RV systolic function, mild MR, severe TR (PPM lead not fused to leaflets), small PFO. She is not volume overloaded on exam today, NYHA class II symptoms. Doing better on current diuretic regimen.  - Increase spironolactone to 25 mg daily.  BMET/BNP today and BMET in 10 days.  - Continue torsemide 60 mg q am/40 mg q pm. - Off Farxiga due to persistent UTI.  3. Tricuspid regurgitation: Most recent echo 2/24 showed moderate to severe TR. TEE was done in 3/24, showing severe TR with ERO 0.43 cm^2 by PISA, regurgitant volume 50 cc, PPM does moves independently from leaflets and does not appear to be fused, there is mild RV dilation/mild RV dysfunction.  I suspect this plays a  significant role in her CHF.  - I have referred patient to [facility] to assess for candidacy for percutaneous tricuspid valve repair (Triclip or Evoque).  4. Atrial fibrillation: Paroxysmal. NSR today. She has periodic AF by device interrogation.  She has had an epicardial and endocardial ablation in the past.  She failed Tikosyn in the past and did not tolerate amiodarone due to side effects.  - Continue Eliquis.  - Continue Toprol XL.  - Ongoing discussions with Dr. Elberta Fortis regarding redo AF ablation.  As she is in AF only rarely now, may be reasonable to hold off until we see if TR can be addressed. There is not a good anti-arrhythmic option for her.  5. Tachy-brady syndrome: Has MDT PPM. Had gen change 07/17/21 6. Cirrhosis: ?NAFLD.  Does not drink any ETOH now, was not a heavy drinker before.  - Followed by [facility].  7. Blue tongue: Unclear etiology. No recent trauma, changes in diet or medications. Discussed her medication regimen with PharmD as some dopamine antagonists and minocycline can cause tongue discoloration. Rasagiline and Sinemet can both cause large, flat, blue or purplish patches in the skin and sinemet can discolor sweat and saliva. Advise she follow up with PCP and or dentist if recurs. - Oxygen saturation 100% on room air today   Arrange for TEE with Dr. Shirlee Latch to further evaluate TR. Follow up afterwards regarding TriClip in [place] vs local.  Marca Ancona FNP-BC 05/20/2022

## 2022-05-20 NOTE — Patient Instructions (Signed)
INCREASE Spironolactone to 25 mg daily.  Labs done today, your results will be available in MyChart, we will contact you for abnormal readings.  Repeat blood work in 10 days.  Your physician recommends that you schedule a follow-up appointment in: 6 weeks  If you have any questions or concerns before your next appointment please send [place] a message through [place] or call our office at [phone].    TO LEAVE A MESSAGE FOR THE NURSE SELECT OPTION 2, PLEASE LEAVE A MESSAGE INCLUDING: YOUR NAME DATE OF BIRTH CALL BACK NUMBER REASON FOR CALL**this is important as we prioritize the call backs  YOU WILL RECEIVE A CALL BACK THE SAME DAY AS LONG AS YOU CALL BEFORE 4:00 PM  At the [facility], you and your health needs are our priority. As part of our continuing mission to provide you with exceptional heart care, we have created designated Provider Care Teams. These Care Teams include your primary Cardiologist (physician) and Advanced Practice Providers (APPs- Physician Assistants and Nurse Practitioners) who all work together to provide you with the care you need, when you need it.   You may see any of the following providers on your designated Care Team at your next follow up: Dr Arvilla Meres Dr Marca Ancona Dr. Marcos Eke, NP Robbie Lis, [place] [facility] [place], [place] Brynda Peon, NP Karle Plumber, PharmD   Please be sure to bring in all your medications bottles to every appointment.    Thank you for choosing [facility]

## 2022-05-21 NOTE — Progress Notes (Signed)
Remote pacemaker transmission.   

## 2022-05-23 ENCOUNTER — Telehealth: Payer: Self-pay

## 2022-05-23 DIAGNOSIS — D509 Iron deficiency anemia, unspecified: Secondary | ICD-10-CM | POA: Diagnosis not present

## 2022-05-23 DIAGNOSIS — Z7901 Long term (current) use of anticoagulants: Secondary | ICD-10-CM | POA: Diagnosis not present

## 2022-05-23 DIAGNOSIS — K7460 Unspecified cirrhosis of liver: Secondary | ICD-10-CM | POA: Diagnosis not present

## 2022-05-23 DIAGNOSIS — R634 Abnormal weight loss: Secondary | ICD-10-CM | POA: Diagnosis not present

## 2022-05-23 NOTE — Telephone Encounter (Signed)
Request received for upcoming endoscopy, per protocol will route to advanced heart failure provider to document medical clearance. Will route to Pharm D for advisement on holding Eliquis.

## 2022-05-23 NOTE — Progress Notes (Signed)
Patient Care Team: Rodrigo Ran, MD as PCP - General (Internal Medicine) Nahser, Deloris Ping, MD as PCP - Cardiology (Cardiology) Regan Lemming, MD as PCP - Electrophysiology (Cardiology) Elenor Legato, MD as Referring Physician (Cardiothoracic Surgery) Charlette Caffey, MD as Consulting Physician (Internal Medicine) Tat, Octaviano Batty, DO as Consulting Physician (Neurology)  DIAGNOSIS: No diagnosis found.  SUMMARY OF ONCOLOGIC HISTORY: Oncology History   No history exists.    CHIEF COMPLIANT:   INTERVAL HISTORY: [name] [name] is a   ALLERGIES:  is allergic to amiodarone and statins.  MEDICATIONS:  Current Outpatient Medications  Medication Sig Dispense Refill   acetaminophen (TYLENOL) 500 MG tablet Take 1,000 mg by mouth at bedtime as needed for mild pain, moderate pain or headache.     Acetylcysteine (NAC 600) 600 MG CAPS Take 600 mg by mouth in the morning.     apixaban (ELIQUIS) 5 MG TABS tablet Take 1 tablet (5 mg total) by mouth 2 (two) times daily. 180 tablet 1   BELSOMRA 20 MG TABS Take 20 mg by mouth at bedtime as needed (sleep).     Calcium Citrate-Vitamin D (CITRACAL + D PO) Take 1 tablet by mouth every evening.     carbidopa-levodopa (SINEMET CR) 50-200 MG tablet TAKE ONE TABLET BY MOUTH EVERYDAY AT BEDTIME 90 tablet 0   carbidopa-levodopa (SINEMET IR) 25-100 MG tablet TAKE 2 TABLETS BY MOUTH 3 TIMES DAILY. 540 tablet 1   cholecalciferol (VITAMIN D3) 25 MCG (1000 UNIT) tablet Take 1,000 Units by mouth in the morning.     cyanocobalamin (,VITAMIN B-12,) 1000 MCG/ML injection INJECT INTRAMUSCULARLY IN THE DELTOID ONCE A MONTH (ALTERNATING DELT. EACH MONTH)     Cyanocobalamin (VITAMIN B12) 1000 MCG TBCR Take 1,000 mcg by mouth in the morning.     docusate sodium (COLACE) 100 MG capsule Take 100 mg by mouth in the morning.     GLUTATHIONE PO Take 2 capsules by mouth in the morning.     levothyroxine (SYNTHROID) 50 MCG tablet Take 50-100 mcg by mouth See  admin instructions. Take 2 tablets (100 mcg) by mouth on Mondays and Thursdays in the morning. Take 1 tablet (50 mcg) by mouth on Sundays, Tuesdays, Wednesdays, Fridays & Saturdays.     magnesium oxide (MAG-OX) 400 MG tablet Take 400 mg by mouth at bedtime.     metoprolol succinate (TOPROL-XL) 25 MG 24 hr tablet TAKE 1 TABLET BY MOUTH EVERY DAY (Patient taking differently: Take 25 mg by mouth daily. TAKE 1 TABLET BY MOUTH EVERY DAY) 90 tablet 3   multivitamin-lutein (OCUVITE-LUTEIN) CAPS capsule Take 1 capsule by mouth in the morning.     NON FORMULARY Take 1 tablet by mouth at bedtime. MetaCalm Stress Regulation Formula     polycarbophil (FIBERCON) 625 MG tablet Take 1,250 mg by mouth in the morning.     potassium chloride (MICRO-K) 10 MEQ CR capsule Take 6 capsules (60 mEq total) by mouth 2 (two) times daily. 240 capsule 3   Probiotic Product (PROBIOTIC PO) Take 1 capsule by mouth every evening.     rasagiline (AZILECT) 1 MG TABS tablet TAKE ONE TABLET BY MOUTH ONCE DAILY 90 tablet 0   spironolactone (ALDACTONE) 25 MG tablet Take 1 tablet (25 mg total) by mouth daily. 90 tablet 3   torsemide (DEMADEX) 20 MG tablet Take 3 tablets (60 mg total) by mouth daily with breakfast AND 2 tablets (40 mg total) every evening. 180 tablet 3   valACYclovir (VALTREX)  1000 MG tablet Take 1,000 mg by mouth 2 (two) times daily as needed (fever blisters).     vitamin C (ASCORBIC ACID) 500 MG tablet Take 500 mg by mouth in the morning.     No current facility-administered medications for this visit.    PHYSICAL EXAMINATION: ECOG PERFORMANCE STATUS: {CHL ONC ECOG PS:[id number]}  There were no vitals filed for this visit. There were no vitals filed for this visit.  BREAST:*** No palpable masses or nodules in either right or left breasts. No palpable axillary supraclavicular or infraclavicular adenopathy no breast tenderness or nipple discharge. (exam performed in the presence of a chaperone)  LABORATORY DATA:   I have reviewed the data as listed    Latest Ref Rng & Units 05/20/2022    5:05 PM 04/08/2022    9:41 AM 03/19/2022    8:44 AM  CMP  Glucose 70 - 99 mg/dL 82  161  096   BUN 8 - 23 mg/dL Creatinine 0.44 - 1.00 mg/dL 0.45  4.09  8.11   Sodium 135 - 145 mmol/L 136  137  136   Potassium 3.5 - 5.1 mmol/L 4.3  3.3  3.7   Chloride 98 - 111 mmol/L 97  99  98   CO2 22 - 32 mmol/L Calcium 8.9 - 10.3 mg/dL 9.4  9.2  9.5     Lab Results  Component Value Date   WBC 5.4 04/21/2022   HGB 10.8 (L) 04/21/2022   HCT 33.3 (L) 04/21/2022   MCV 89.3 04/21/2022   PLT 222 04/21/2022   NEUTROABS 4.0 04/21/2022    ASSESSMENT & PLAN:  No problem-specific Assessment & Plan notes found for this encounter.    No orders of the defined types were placed in this encounter.  The patient has a good understanding of the overall plan. she agrees with it. she will call with any problems that may develop before the next visit here. Total time spent: 30 mins including face to face time and time spent for planning, charting and co-ordination of care   Sherlyn Lick, CMA 05/23/22    I Janan Ridge am acting as a Neurosurgeon for [employer]  ***

## 2022-05-23 NOTE — Telephone Encounter (Signed)
Ok to hold eliquis. 

## 2022-05-23 NOTE — Telephone Encounter (Signed)
   Primary Cardiologist: Kristeen Miss, MD  Chart reviewed as part of pre-operative protocol coverage. Given past medical history and time since last visit, based on ACC/AHA guidelines, [name] [name] would be at acceptable risk for the planned procedure without further cardiovascular testing.   Patient was advised that if she develops new symptoms prior to surgery to contact our office to arrange a follow-up appointment.  He verbalized understanding.  Per office protocol, patient can hold Eliquis for 2 days prior to procedure.   I will route this recommendation to the requesting party via Epic fax function and remove from pre-op pool.  Please call with questions.  Levi Aland, NP-C  05/23/2022, 2:51 PM [address], [address] Office [phone] Fax [phone]

## 2022-05-23 NOTE — Telephone Encounter (Signed)
..     Pre-operative Risk Assessment    Patient Name: [name] [name]  DOB: 20-May-1940 MRN: [id number]      Request for Surgical Clearance    Procedure:   ENDOSCOPY  Date of Surgery:  Clearance 06/05/22                                Surgeon:  DR Dulce Sellar Surgeon's Group or Practice Name:  [facility] Phone number:  [phone] Fax number:  [phone]   Type of Clearance Requested:   - Medical  - Pharmacy:  Hold Apixaban (Eliquis)     Type of Anesthesia:   PROPOFOL   Additional requests/questions:    Jola Babinski   05/23/2022, 12:06 PM

## 2022-05-23 NOTE — Telephone Encounter (Signed)
Patient with diagnosis of A Fib on Eliquis for anticoagulation.    Procedure: endoscopy Date of procedure: 06/05/22   CHA2DS2-VASc Score = 6  This indicates a 9.7% annual risk of stroke. The patient's score is based upon: CHF History: 1 HTN History: 1 Diabetes History: 0 Stroke History: 0 Vascular Disease History: 1 Age Score: 2 Gender Score: 1  CrCl 41 mL/min Platelet count 222K   Per office protocol, patient can hold Eliquis for 2 days prior to procedure.    **This guidance is not considered finalized until pre-operative APP has relayed final recommendations.**

## 2022-05-26 ENCOUNTER — Other Ambulatory Visit: Payer: Medicare Other

## 2022-05-26 ENCOUNTER — Ambulatory Visit: Payer: Medicare Other | Admitting: Hematology and Oncology

## 2022-05-26 DIAGNOSIS — H353232 Exudative age-related macular degeneration, bilateral, with inactive choroidal neovascularization: Secondary | ICD-10-CM | POA: Diagnosis not present

## 2022-05-26 DIAGNOSIS — Z961 Presence of intraocular lens: Secondary | ICD-10-CM | POA: Diagnosis not present

## 2022-05-26 DIAGNOSIS — H26491 Other secondary cataract, right eye: Secondary | ICD-10-CM | POA: Diagnosis not present

## 2022-05-26 DIAGNOSIS — H5213 Myopia, bilateral: Secondary | ICD-10-CM | POA: Diagnosis not present

## 2022-05-27 ENCOUNTER — Inpatient Hospital Stay (HOSPITAL_BASED_OUTPATIENT_CLINIC_OR_DEPARTMENT_OTHER): Payer: Medicare Other | Admitting: Hematology and Oncology

## 2022-05-27 ENCOUNTER — Inpatient Hospital Stay: Payer: Medicare Other | Attending: Hematology and Oncology

## 2022-05-27 VITALS — BP 110/67 | HR 85 | Temp 97.5°F | Resp 18 | Ht 63.0 in | Wt 124.3 lb

## 2022-05-27 DIAGNOSIS — D649 Anemia, unspecified: Secondary | ICD-10-CM

## 2022-05-27 DIAGNOSIS — Z79899 Other long term (current) drug therapy: Secondary | ICD-10-CM | POA: Diagnosis not present

## 2022-05-27 LAB — CBC WITH DIFFERENTIAL (CANCER CENTER ONLY)
Abs Immature Granulocytes: 0.01 10*3/uL (ref 0.00–0.07)
Basophils Absolute: 0.1 10*3/uL (ref 0.0–0.1)
Basophils Relative: 1 %
Eosinophils Absolute: 0.1 10*3/uL (ref 0.0–0.5)
Eosinophils Relative: 2 %
HCT: 33 % — ABNORMAL LOW (ref 36.0–46.0)
Hemoglobin: 10.5 g/dL — ABNORMAL LOW (ref 12.0–15.0)
Immature Granulocytes: 0 %
Lymphocytes Relative: 14 %
Lymphs Abs: 0.8 10*3/uL (ref 0.7–4.0)
MCH: 27.3 pg (ref 26.0–34.0)
MCHC: 31.8 g/dL (ref 30.0–36.0)
MCV: 85.9 fL (ref 80.0–100.0)
Monocytes Absolute: 0.6 10*3/uL (ref 0.1–1.0)
Monocytes Relative: 11 %
Neutro Abs: 4.1 10*3/uL (ref 1.7–7.7)
Neutrophils Relative %: 72 %
Platelet Count: 225 10*3/uL (ref 150–400)
RBC: 3.84 MIL/uL — ABNORMAL LOW (ref 3.87–5.11)
RDW: 15.5 % (ref 11.5–15.5)
WBC Count: 5.6 10*3/uL (ref 4.0–10.5)
nRBC: 0 % (ref 0.0–0.2)

## 2022-05-27 NOTE — Assessment & Plan Note (Signed)
Lab review: 06/11/2021: Hemoglobin 10.3, MCV 88.2 02/21/2022: Hemoglobin 8.3, MCV 101, ferritin 88, creatinine 1.08, iron saturation 13%, TIBC 406 03/06/2022: Hemoglobin 8.7, MCV 96, RDW 15.9 04/21/2022: Hemoglobin 10.8, MCV 89.3 05/27/2022:   Cord stem cell infusion November 2023   Work-up: SPEP: No M Protein LDH 232 (was 535) Folate: 15.9, TSH 4.1, DAT: Neg, Retic 2.3%, Imm Retic Frac: 24.8, BR 1.5, B 12: 690, EPO: 39.2, Hb 9.5, Cr 0.85   No clear cut etiology. Recommend: Watchful monitoring

## 2022-05-30 ENCOUNTER — Ambulatory Visit (HOSPITAL_COMMUNITY)
Admission: RE | Admit: 2022-05-30 | Discharge: 2022-05-30 | Disposition: A | Discharge status: Home / Self Care | Payer: Medicare Other | Source: Ambulatory Visit | Attending: Cardiology | Admitting: Cardiology

## 2022-05-30 DIAGNOSIS — I5032 Chronic diastolic (congestive) heart failure: Secondary | ICD-10-CM | POA: Diagnosis not present

## 2022-05-30 LAB — BASIC METABOLIC PANEL
Anion gap: 9 (ref 5–15)
BUN: 20 mg/dL (ref 8–23)
CO2: 30 mmol/L (ref 22–32)
Calcium: 9.3 mg/dL (ref 8.9–10.3)
Chloride: 100 mmol/L (ref 98–111)
Creatinine, Ser: 1.1 mg/dL — ABNORMAL HIGH (ref 0.44–1.00)
GFR, Estimated: 51 mL/min — ABNORMAL LOW (ref >60)
Glucose, Bld: 110 mg/dL — ABNORMAL HIGH (ref 70–99)
Potassium: 4 mmol/L (ref 3.5–5.1)
Sodium: 139 mmol/L (ref 135–145)

## 2022-06-02 ENCOUNTER — Other Ambulatory Visit (HOSPITAL_COMMUNITY): Payer: Medicare Other

## 2022-06-03 DIAGNOSIS — H4050X3 Glaucoma secondary to other eye disorders, unspecified eye, severe stage: Secondary | ICD-10-CM | POA: Diagnosis not present

## 2022-06-03 DIAGNOSIS — Z85828 Personal history of other malignant neoplasm of skin: Secondary | ICD-10-CM | POA: Diagnosis not present

## 2022-06-03 DIAGNOSIS — H43812 Vitreous degeneration, left eye: Secondary | ICD-10-CM | POA: Diagnosis not present

## 2022-06-03 DIAGNOSIS — L570 Actinic keratosis: Secondary | ICD-10-CM | POA: Diagnosis not present

## 2022-06-03 DIAGNOSIS — L821 Other seborrheic keratosis: Secondary | ICD-10-CM | POA: Diagnosis not present

## 2022-06-03 DIAGNOSIS — L820 Inflamed seborrheic keratosis: Secondary | ICD-10-CM | POA: Diagnosis not present

## 2022-06-03 DIAGNOSIS — H35721 Serous detachment of retinal pigment epithelium, right eye: Secondary | ICD-10-CM | POA: Diagnosis not present

## 2022-06-03 DIAGNOSIS — H353211 Exudative age-related macular degeneration, right eye, with active choroidal neovascularization: Secondary | ICD-10-CM | POA: Diagnosis not present

## 2022-06-03 DIAGNOSIS — H353132 Nonexudative age-related macular degeneration, bilateral, intermediate dry stage: Secondary | ICD-10-CM | POA: Diagnosis not present

## 2022-06-03 DIAGNOSIS — H353221 Exudative age-related macular degeneration, left eye, with active choroidal neovascularization: Secondary | ICD-10-CM | POA: Diagnosis not present

## 2022-06-05 DIAGNOSIS — R634 Abnormal weight loss: Secondary | ICD-10-CM | POA: Diagnosis not present

## 2022-06-05 DIAGNOSIS — D649 Anemia, unspecified: Secondary | ICD-10-CM | POA: Diagnosis not present

## 2022-06-05 DIAGNOSIS — K921 Melena: Secondary | ICD-10-CM | POA: Diagnosis not present

## 2022-06-05 DIAGNOSIS — K2970 Gastritis, unspecified, without bleeding: Secondary | ICD-10-CM | POA: Diagnosis not present

## 2022-06-05 DIAGNOSIS — K319 Disease of stomach and duodenum, unspecified: Secondary | ICD-10-CM | POA: Diagnosis not present

## 2022-06-09 DIAGNOSIS — K319 Disease of stomach and duodenum, unspecified: Secondary | ICD-10-CM | POA: Diagnosis not present

## 2022-06-12 DIAGNOSIS — H26491 Other secondary cataract, right eye: Secondary | ICD-10-CM | POA: Diagnosis not present

## 2022-06-20 DIAGNOSIS — M25561 Pain in right knee: Secondary | ICD-10-CM | POA: Diagnosis not present

## 2022-06-20 DIAGNOSIS — M25552 Pain in left hip: Secondary | ICD-10-CM | POA: Diagnosis not present

## 2022-06-20 DIAGNOSIS — M25562 Pain in left knee: Secondary | ICD-10-CM | POA: Diagnosis not present

## 2022-06-20 DIAGNOSIS — S7002XA Contusion of left hip, initial encounter: Secondary | ICD-10-CM | POA: Diagnosis not present

## 2022-06-23 ENCOUNTER — Ambulatory Visit: Payer: Medicare Other | Attending: Cardiology | Admitting: Cardiology

## 2022-06-23 ENCOUNTER — Encounter: Payer: Self-pay | Admitting: Cardiology

## 2022-06-23 VITALS — BP 116/70 | HR 86 | Ht 63.0 in | Wt 126.0 lb

## 2022-06-23 DIAGNOSIS — I4811 Longstanding persistent atrial fibrillation: Secondary | ICD-10-CM | POA: Insufficient documentation

## 2022-06-23 DIAGNOSIS — I10 Essential (primary) hypertension: Secondary | ICD-10-CM | POA: Insufficient documentation

## 2022-06-23 DIAGNOSIS — D6869 Other thrombophilia: Secondary | ICD-10-CM | POA: Insufficient documentation

## 2022-06-23 DIAGNOSIS — I495 Sick sinus syndrome: Secondary | ICD-10-CM

## 2022-06-23 LAB — CUP PACEART INCLINIC DEVICE CHECK
Battery Remaining Longevity: 121 mo
Battery Voltage: 3.03 V
Brady Statistic AP VP Percent: 95.18 %
Brady Statistic AP VS Percent: 0.67 %
Brady Statistic AS VP Percent: 3.83 %
Brady Statistic AS VS Percent: 0.32 %
Brady Statistic RA Percent Paced: 88.03 %
Brady Statistic RV Percent Paced: 98.36 %
Date Time Interrogation Session: 20240520162509
Implantable Lead Connection Status: 753985
Implantable Lead Connection Status: 753985
Implantable Lead Implant Date: 20140917
Implantable Lead Implant Date: 20140917
Implantable Lead Location: 753859
Implantable Lead Location: 753860
Implantable Pulse Generator Implant Date: 20230614
Lead Channel Impedance Value: 342 Ohm
Lead Channel Impedance Value: 380 Ohm
Lead Channel Impedance Value: 399 Ohm
Lead Channel Impedance Value: 437 Ohm
Lead Channel Pacing Threshold Amplitude: 0.75 V
Lead Channel Pacing Threshold Amplitude: 0.75 V
Lead Channel Pacing Threshold Pulse Width: 0.4 ms
Lead Channel Pacing Threshold Pulse Width: 0.4 ms
Lead Channel Sensing Intrinsic Amplitude: 0.25 mV
Lead Channel Sensing Intrinsic Amplitude: 0.5 mV
Lead Channel Sensing Intrinsic Amplitude: 13.25 mV
Lead Channel Sensing Intrinsic Amplitude: 19.875 mV
Lead Channel Setting Pacing Amplitude: 1.5 V
Lead Channel Setting Pacing Amplitude: 2 V
Lead Channel Setting Pacing Pulse Width: 0.4 ms
Lead Channel Setting Sensing Sensitivity: 1.2 mV
Zone Setting Status: 755011

## 2022-06-23 NOTE — Patient Instructions (Signed)
Medication Instructions:  Your physician recommends that you continue on your current medications as directed. Please refer to the Current Medication list given to you today.  *If you need a refill on your cardiac medications before your next appointment, please call your pharmacy*   Lab Work: None ordered   Testing/Procedures: None ordered   Follow-Up: At [facility], you and your health needs are our priority.  As part of our continuing mission to provide you with exceptional heart care, we have created designated Provider Care Teams.  These Care Teams include your primary Cardiologist (physician) and Advanced Practice Providers (APPs -  Physician Assistants and Nurse Practitioners) who all work together to provide you with the care you need, when you need it.  Remote monitoring is used to monitor your Pacemaker or ICD from home. This monitoring reduces the number of office visits required to check your device to one time per year. It allows [place] to keep an eye on the functioning of your device to ensure it is working properly. You are scheduled for a device check from home on 07/17/2022. You may send your transmission at any time that day. If you have a wireless device, the transmission will be sent automatically. After your physician reviews your transmission, you will receive a postcard with your next transmission date.  Your next appointment:   6 month(s)  The format for your next appointment:   In Person  Provider:   Loman Brooklyn, MD    Thank you for choosing [facility]!!   Dory Horn, RN [phone]

## 2022-06-23 NOTE — Progress Notes (Signed)
Electrophysiology Office Note   Date:  06/23/2022   ID:  [name] [name], [name] [name] 07-Aug-1940, MRN [id number]  PCP:  Rodrigo Ran, MD  Cardiologist:   Primary Electrophysiologist:  Kaleo Condrey Jorja Loa, MD    Chief Complaint: pacemaker   History of Present Illness: [name] [name] is a 82 y.o. female who is being seen today for the evaluation of pacemaker at the request of Rodrigo Ran, MD. Presenting today for electrophysiology evaluation.  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  She has had both endocardial and epicardial ablations for her atrial fibrillation.  She also has severe tricuspid regurgitation.  Today, denies symptoms of palpitations, chest pain, orthopnea, PND, lower extremity edema, claudication, dizziness, presyncope, syncope, bleeding, or neurologic sequela. The patient is tolerating medications without difficulties.  Her main complaint is fatigue and shortness of breath.  She has severe tricuspid regurgitation.  She has plans to meet with a cardiologist in [place] to discuss tricuspid valve clip.  She is hopeful that this [name] [name] be a relief of her symptoms.  She is unaware of any arrhythmias.    Past Medical History:  Diagnosis Date   Anemia    Arthritis    thumbs   Atrial tachycardia    Atypical atrial flutter (HCC)    Cataract    bilateral   Chronic anticoagulation    on Xarelto   Chronic diastolic CHF (congestive heart failure) (HCC)    Echocardiogram (11/24/12): Mild LVH, EF 60%.   Dysrhythmia    GERD (gastroesophageal reflux disease)    hx of no meds   Heart murmur    slight   Hyperlipidemia    a. Hx of leg weakness while on statin. under control   Hypertension    Hypothyroidism    Leg fracture Dec 21,2011   Osteoporosis 07/2007    Parkinson's disease 2011   Persistent atrial fibrillation (HCC)    s/p atrial fib ablation 11-11-11.    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HCC)    Squamous carcinoma summer 2015   back of left thigh   Wrist fracture    right   Past Surgical History:  Procedure Laterality Date   ATRIAL FIBRILLATION ABLATION N/A 11/11/2011   Procedure: ATRIAL FIBRILLATION ABLATION;  Surgeon: Hillis Range, MD;  Location: [facility] CATH LAB;  Service: Cardiovascular;  Laterality: N/A;   BREAST BIOPSY  1998   BREAST SURGERY     benign cyst removed   CARDIAC ELECTROPHYSIOLOGY STUDY AND ABLATION  5/14, 7/14   convergent AF ablation at [facility] and subsequent atypical atrial flutter ablation by Dr Hurman Horn   CARDIOVASCULAR STRESS TEST  06-06-2008   EF 73%   CARDIOVERSION  09/09/2011   Procedure: CARDIOVERSION;  Surgeon: Cassell Clement, MD;  Location: [facility] ENDOSCOPY;  Service: Cardiovascular;  Laterality: N/A;  to be done by dr. Patty Sermons   CARDIOVERSION  02/18/2012   Procedure: CARDIOVERSION;  Surgeon: Vesta Mixer, MD;  Location: [facility] ENDOSCOPY;  Service: Cardiovascular;  Laterality: N/A;   CHOLECYSTECTOMY  1995   FEMUR FRACTURE SURGERY  2011   metal pin in place   FOREARM SURGERY Left 2010   "opened arm to drain it"   INSERTION OF MESH N/A 11/24/2013  Procedure: INSERTION OF MESH;  Surgeon: Karie Soda, MD;  Location: [facility] ORS;  Service: General;  Laterality: N/A;   IR THORACENTESIS ASP PLEURAL SPACE W/IMG GUIDE  02/25/2022   KNEE SURGERY Left 2001   Left unicompartmental knee     Dr. Charlann Boxer 11/18/16   PACEMAKER PLACEMENT  10/20/12   MDT Advisa DR implanted by Dr Hurman Horn at [facility]    PARTIAL KNEE ARTHROPLASTY Left 11/18/2016   Procedure: LEFT UNICOMPARTMENTAL KNEE Medially;  Surgeon: Durene Romans, MD;  Location: [facility] ORS;  Service: Orthopedics;  Laterality: Left;   90 mins   PPM GENERATOR CHANGEOUT N/A 07/17/2021   Procedure: PPM GENERATOR CHANGEOUT;  Surgeon: Regan Lemming, MD;  Location: [facility] INVASIVE CV LAB;  Service: Cardiovascular;  Laterality: N/A;   RIGHT HEART CATH N/A 06/13/2021   Procedure: RIGHT HEART CATH;  Surgeon: Laurey Morale, MD;  Location: [facility] INVASIVE CV LAB;  Service: Cardiovascular;  Laterality: N/A;   RIGHT HEART CATH N/A 03/06/2022   Procedure: RIGHT HEART CATH;  Surgeon: Laurey Morale, MD;  Location: [facility] INVASIVE CV LAB;  Service: Cardiovascular;  Laterality: N/A;   SKIN CANCER DESTRUCTION  summer 2015   TEE WITHOUT CARDIOVERSION  11/10/2011   Procedure: TRANSESOPHAGEAL ECHOCARDIOGRAM (TEE);  Surgeon: Vesta Mixer, MD;  Location: [facility] ENDOSCOPY;  Service: Cardiovascular;  Laterality: N/A;   TEE WITHOUT CARDIOVERSION N/A 04/23/2022   Procedure: TRANSESOPHAGEAL ECHOCARDIOGRAM (TEE);  Surgeon: Laurey Morale, MD;  Location: [facility] ENDOSCOPY;  Service: Cardiovascular;  Laterality: N/A;   TONSILLECTOMY  as child   US ECHOCARDIOGRAPHY  03-10-2008   Est EF 55-60%, Dr. Melburn Popper   VENTRAL HERNIA REPAIR N/A 11/24/2013   Procedure: LAPAROSCOPIC VENTRAL HERNIA;  Surgeon: Karie Soda, MD;  Location: [facility] ORS;  Service: General;  Laterality: N/A;   WRIST SURGERY Right ~2009   metal rod in place     Current Outpatient Medications  Medication Sig Dispense Refill   acetaminophen (TYLENOL) 500 MG tablet Take 1,000 mg by mouth at bedtime as needed for mild pain, moderate pain or headache.     Acetylcysteine (NAC 600) 600 MG CAPS Take 600 mg by mouth in the morning.     apixaban (ELIQUIS) 5 MG TABS tablet Take 1 tablet (5 mg total) by mouth 2 (two) times daily. 180 tablet 1   B-D 3CC LUER-LOK SYR 25GX1" 25G X 1" 3 ML MISC Inject 1 mL into the muscle once a week.     BELSOMRA 20 MG TABS Take 20 mg by mouth at bedtime as needed (sleep).     Calcium Citrate-Vitamin D (CITRACAL + D PO) Take 1 tablet by mouth every evening.     carbidopa-levodopa  (SINEMET CR) 50-200 MG tablet TAKE ONE TABLET BY MOUTH EVERYDAY AT BEDTIME 90 tablet 0   carbidopa-levodopa (SINEMET IR) 25-100 MG tablet TAKE 2 TABLETS BY MOUTH 3 TIMES DAILY. 540 tablet 1   cholecalciferol (VITAMIN D3) 25 MCG (1000 UNIT) tablet Take 1,000 Units by mouth in the morning.     cyanocobalamin (,VITAMIN B-12,) 1000 MCG/ML injection INJECT INTRAMUSCULARLY IN THE DELTOID ONCE A MONTH (ALTERNATING DELT. EACH MONTH)     Cyanocobalamin (VITAMIN B12) 1000 MCG TBCR Take 1,000 mcg by mouth in the morning.     docusate sodium (COLACE) 100 MG capsule Take 100 mg by mouth in the morning.     GLUTATHIONE PO Take 2 capsules by mouth in the morning.     levothyroxine (SYNTHROID) 50 MCG tablet Take 50-100 mcg by mouth See  admin instructions. Take 2 tablets (100 mcg) by mouth on Mondays and Thursdays in the morning. Take 1 tablet (50 mcg) by mouth on Sundays, Tuesdays, Wednesdays, Fridays & Saturdays.     magnesium oxide (MAG-OX) 400 MG tablet Take 400 mg by mouth at bedtime.     metoprolol succinate (TOPROL-XL) 25 MG 24 hr tablet TAKE 1 TABLET BY MOUTH EVERY DAY (Patient taking differently: Take 25 mg by mouth daily. TAKE 1 TABLET BY MOUTH EVERY DAY) 90 tablet 3   multivitamin-lutein (OCUVITE-LUTEIN) CAPS capsule Take 1 capsule by mouth in the morning.     NON FORMULARY Take 1 tablet by mouth at bedtime. MetaCalm Stress Regulation Formula     polycarbophil (FIBERCON) 625 MG tablet Take 1,250 mg by mouth in the morning.     potassium chloride (MICRO-K) 10 MEQ CR capsule Take 6 capsules (60 mEq total) by mouth 2 (two) times daily. 240 capsule 3   Probiotic Product (PROBIOTIC PO) Take 1 capsule by mouth every evening.     rasagiline (AZILECT) 1 MG TABS tablet TAKE ONE TABLET BY MOUTH ONCE DAILY 90 tablet 0   spironolactone (ALDACTONE) 25 MG tablet Take 1 tablet (25 mg total) by mouth daily. 90 tablet 3   torsemide (DEMADEX) 20 MG tablet Take 3 tablets (60 mg total) by mouth daily with breakfast AND  2 tablets (40 mg total) every evening. 180 tablet 3   valACYclovir (VALTREX) 1000 MG tablet Take 1,000 mg by mouth 2 (two) times daily as needed (fever blisters).     vitamin C (ASCORBIC ACID) 500 MG tablet Take 500 mg by mouth in the morning.     No current facility-administered medications for this visit.    Allergies:   Amiodarone and Statins   Social History:  The patient  reports that she has never smoked. She has never used smokeless tobacco. She reports that she does not drink alcohol and does not use drugs.   Family History:  The patient's family history includes Alzheimer's disease in her mother; Breast cancer in her maternal aunt; Healthy in her child and child; Heart failure in her father.   ROS:  Please see the history of present illness.   Otherwise, review of systems is positive for none.   All other systems are reviewed and negative.   PHYSICAL EXAM: VS:  BP 116/70   Pulse 86   Ht 5\' 3"  (1.6 m)   Wt 126 lb (57.2 kg)   LMP 02/04/1996   SpO2 99%   BMI 22.32 kg/m  , BMI Body mass index is 22.32 kg/m. GEN: Well nourished, well developed, in no acute distress  HEENT: normal  Neck: no JVD, carotid bruits, or masses Cardiac: RRR; no murmurs, rubs, or gallops,no edema  Respiratory:  clear to auscultation bilaterally, normal work of breathing GI: soft, nontender, nondistended, + BS MS: no deformity or atrophy  Skin: warm and dry, device site well healed Neuro:  Strength and sensation are intact Psych: euthymic mood, full affect  EKG:  EKG is not ordered today. Personal review of the ekg ordered 05/20/22 shows AV paced  Personal review of the device interrogation today. Results in [facility]   Recent Labs: 02/20/2022: Magnesium 2.2 03/11/2022: ALT <5; TSH 4.100 05/20/2022: B Natriuretic Peptide 82.9 05/27/2022: Hemoglobin 10.5; Platelet Count 225 05/30/2022: BUN 20; Creatinine, Ser 1.10; Potassium 4.0; Sodium 139    Lipid Panel     Component Value Date/Time   CHOL 133  02/20/2022 0913   TRIG 47 02/20/2022  0913   HDL 53 02/20/2022 0913   CHOLHDL 2.5 02/20/2022 0913   VLDL 9 02/20/2022 0913   LDLCALC 71 02/20/2022 0913     Wt Readings from Last 3 Encounters:  06/23/22 126 lb (57.2 kg)  05/27/22 124 lb 4.8 oz (56.4 kg)  05/20/22 126 lb (57.2 kg)      Other studies Reviewed: Additional studies/ records that were reviewed today include: TTE 04/29/21  Review of the above records today demonstrates:   1. Left ventricular ejection fraction, by estimation, is 65 to 70%. The  left ventricle has normal function. The left ventricle has no regional  wall motion abnormalities. There is mild concentric left ventricular  hypertrophy. Left ventricular diastolic  parameters are indeterminate.   2. Right ventricular systolic function is normal. The right ventricular  size is normal. There is severely elevated pulmonary artery systolic  pressure. The estimated right ventricular systolic pressure is 60.4 mmHg.   3. Left atrial size was moderately dilated.   4. Right atrial size was moderately dilated.   5. The mitral valve is grossly normal. Mild mitral valve regurgitation.   6. Tricuspid valve regurgitation is moderate.   7. The aortic valve is tricuspid. There is mild calcification of the  aortic valve. There is mild thickening of the aortic valve. Aortic valve  regurgitation is not visualized. Aortic valve sclerosis/calcification is  present, without any evidence of  aortic stenosis.    ASSESSMENT AND PLAN:  1.  Tachybradycardia syndrome: Status post Medtronic dual-chamber pacemaker with generator change 07/17/2021.  Device functioning properly.  No changes.  2.  Longstanding persistent atrial fibrillation: Currently on Eliquis.  CHA2DS2-VASc of 5.  Has had both endocardial and epicardial ablations in the past.  She has an 8% burden of AT/AF today.  [name] [name] reevaluate after her possible tricuspid valve intervention.  3.  Chronic diastolic heart failure: No  obvious volume overload.  Plan per primary cardiology.  4.  Secondary hypercoagulable state: Currently on Eliquis for atrial fibrillation  5.  Hypertension: Currently well-controlled   Current medicines are reviewed at length with the patient today.   The patient does not have concerns regarding her medicines.  The following changes were made today: None  Labs/ tests ordered today include:  No orders of the defined types were placed in this encounter.    Disposition:   FU 6 months  Signed, Harrison Paulson Jorja Loa, MD  06/23/2022 3:40 PM     [facility] [address] [place] [address] [phone] (office) [phone] (fax)

## 2022-06-24 ENCOUNTER — Telehealth (HOSPITAL_COMMUNITY): Payer: Self-pay

## 2022-06-24 NOTE — Telephone Encounter (Signed)
Patient called and stated her dentist said she would need an antibiotic before a dental procedure due to her heart issues.  She just wants to confirm this is true.

## 2022-06-25 ENCOUNTER — Other Ambulatory Visit (HOSPITAL_COMMUNITY): Payer: Self-pay | Admitting: Family Medicine

## 2022-06-25 NOTE — Telephone Encounter (Signed)
Left detailed message for her

## 2022-06-26 ENCOUNTER — Encounter (HOSPITAL_COMMUNITY): Payer: Medicare Other

## 2022-06-26 DIAGNOSIS — R9431 Abnormal electrocardiogram [ECG] [EKG]: Secondary | ICD-10-CM | POA: Diagnosis not present

## 2022-06-26 DIAGNOSIS — I5032 Chronic diastolic (congestive) heart failure: Secondary | ICD-10-CM | POA: Diagnosis not present

## 2022-06-26 DIAGNOSIS — Z79899 Other long term (current) drug therapy: Secondary | ICD-10-CM | POA: Diagnosis not present

## 2022-06-26 DIAGNOSIS — I272 Pulmonary hypertension, unspecified: Secondary | ICD-10-CM | POA: Diagnosis not present

## 2022-06-26 DIAGNOSIS — I081 Rheumatic disorders of both mitral and tricuspid valves: Secondary | ICD-10-CM | POA: Diagnosis not present

## 2022-06-26 DIAGNOSIS — I110 Hypertensive heart disease with heart failure: Secondary | ICD-10-CM | POA: Diagnosis not present

## 2022-06-26 DIAGNOSIS — Z950 Presence of cardiac pacemaker: Secondary | ICD-10-CM | POA: Diagnosis not present

## 2022-06-26 DIAGNOSIS — Z7901 Long term (current) use of anticoagulants: Secondary | ICD-10-CM | POA: Diagnosis not present

## 2022-06-26 DIAGNOSIS — G20A1 Parkinson's disease without dyskinesia, without mention of fluctuations: Secondary | ICD-10-CM | POA: Diagnosis not present

## 2022-06-26 DIAGNOSIS — I361 Nonrheumatic tricuspid (valve) insufficiency: Secondary | ICD-10-CM | POA: Diagnosis not present

## 2022-06-26 DIAGNOSIS — Z8679 Personal history of other diseases of the circulatory system: Secondary | ICD-10-CM | POA: Diagnosis not present

## 2022-07-01 ENCOUNTER — Other Ambulatory Visit: Payer: Self-pay | Admitting: Internal Medicine

## 2022-07-01 DIAGNOSIS — Z1231 Encounter for screening mammogram for malignant neoplasm of breast: Secondary | ICD-10-CM

## 2022-07-02 DIAGNOSIS — E538 Deficiency of other specified B group vitamins: Secondary | ICD-10-CM | POA: Diagnosis not present

## 2022-07-02 DIAGNOSIS — I10 Essential (primary) hypertension: Secondary | ICD-10-CM | POA: Diagnosis not present

## 2022-07-02 DIAGNOSIS — M810 Age-related osteoporosis without current pathological fracture: Secondary | ICD-10-CM | POA: Diagnosis not present

## 2022-07-02 DIAGNOSIS — D644 Congenital dyserythropoietic anemia: Secondary | ICD-10-CM | POA: Diagnosis not present

## 2022-07-02 DIAGNOSIS — E785 Hyperlipidemia, unspecified: Secondary | ICD-10-CM | POA: Diagnosis not present

## 2022-07-02 DIAGNOSIS — Z0000 Encounter for general adult medical examination without abnormal findings: Secondary | ICD-10-CM | POA: Diagnosis not present

## 2022-07-02 DIAGNOSIS — R946 Abnormal results of thyroid function studies: Secondary | ICD-10-CM | POA: Diagnosis not present

## 2022-07-02 DIAGNOSIS — D649 Anemia, unspecified: Secondary | ICD-10-CM | POA: Diagnosis not present

## 2022-07-02 DIAGNOSIS — R7301 Impaired fasting glucose: Secondary | ICD-10-CM | POA: Diagnosis not present

## 2022-07-03 ENCOUNTER — Other Ambulatory Visit: Payer: Self-pay | Admitting: Cardiology

## 2022-07-03 DIAGNOSIS — I4811 Longstanding persistent atrial fibrillation: Secondary | ICD-10-CM

## 2022-07-03 NOTE — Telephone Encounter (Signed)
Prescription refill request for Eliquis received. Indication:afib Last office visit:5/24 Scr:1.10  4/24 Age: 82 Weight:57.2  kg  Prescription refilled

## 2022-07-08 DIAGNOSIS — I5033 Acute on chronic diastolic (congestive) heart failure: Secondary | ICD-10-CM | POA: Diagnosis not present

## 2022-07-08 DIAGNOSIS — D6869 Other thrombophilia: Secondary | ICD-10-CM | POA: Diagnosis not present

## 2022-07-08 DIAGNOSIS — I10 Essential (primary) hypertension: Secondary | ICD-10-CM | POA: Diagnosis not present

## 2022-07-08 DIAGNOSIS — M810 Age-related osteoporosis without current pathological fracture: Secondary | ICD-10-CM | POA: Diagnosis not present

## 2022-07-08 DIAGNOSIS — Z0000 Encounter for general adult medical examination without abnormal findings: Secondary | ICD-10-CM | POA: Diagnosis not present

## 2022-07-08 DIAGNOSIS — I5032 Chronic diastolic (congestive) heart failure: Secondary | ICD-10-CM | POA: Diagnosis not present

## 2022-07-08 DIAGNOSIS — I480 Paroxysmal atrial fibrillation: Secondary | ICD-10-CM | POA: Diagnosis not present

## 2022-07-08 DIAGNOSIS — Z950 Presence of cardiac pacemaker: Secondary | ICD-10-CM | POA: Diagnosis not present

## 2022-07-08 DIAGNOSIS — E039 Hypothyroidism, unspecified: Secondary | ICD-10-CM | POA: Diagnosis not present

## 2022-07-08 DIAGNOSIS — R82998 Other abnormal findings in urine: Secondary | ICD-10-CM | POA: Diagnosis not present

## 2022-07-08 DIAGNOSIS — G20A1 Parkinson's disease without dyskinesia, without mention of fluctuations: Secondary | ICD-10-CM | POA: Diagnosis not present

## 2022-07-08 DIAGNOSIS — E785 Hyperlipidemia, unspecified: Secondary | ICD-10-CM | POA: Diagnosis not present

## 2022-07-08 DIAGNOSIS — E538 Deficiency of other specified B group vitamins: Secondary | ICD-10-CM | POA: Diagnosis not present

## 2022-07-11 ENCOUNTER — Telehealth (HOSPITAL_COMMUNITY): Payer: Self-pay | Admitting: Cardiology

## 2022-07-11 DIAGNOSIS — I5032 Chronic diastolic (congestive) heart failure: Secondary | ICD-10-CM

## 2022-07-11 NOTE — Telephone Encounter (Signed)
Pt reports she need pre procedure labs per [facility] scheduled in 08/2022   Aurther Loft with [facility] reports pt will need BNP,BMET,CBC Dr Virl Diamond Order will be faxed over  Upcoming appt updated and order placed with provider CC'd

## 2022-07-11 NOTE — Addendum Note (Signed)
Addended by: Laisha Rau, Milagros Reap on: 07/11/2022 02:59 PM   Modules accepted: Orders

## 2022-07-15 NOTE — Progress Notes (Signed)
PCP: Rodrigo Ran, MD EP: Dr. Elberta Fortis HF Cardiology: Dr. Shirlee Latch  82 y.o. 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  Patient had an echo done in 3/23 showing EF 65-70%, mild LVH, normal RV, PASP 60 mmHg, moderate biatrial enlargement, mild MR, moderate TR. Prior echoes had shown mildly elevated PA pressure. Cardiolite in 10/21 showed no ischemia.   RHC was done in 5/23 showing mild pulmonary venous hypertension with mildly elevated PCWP. She was started on Farxiga.  She developed a persistent UTI that was difficult to clear and stopped [place].   Acute follow up 02/20/22 for dyspnea. CTA chest showed no PE, moderate right pleural effusion, mild pulmonary edema. NYHA III symptoms and volume overloaded, she was given IV lasix 80 mg x 1 then instructed to use [name] daily x 2 days, then switch from Lasix to torsemide 60 mg daily. RHC arranged which showed elevated R/L heart filling pressures, prominent V waves in PCWP tracing, preserved CO and moderate pulmonary venous hypertension. Echo arranged to assess MR and torsemide increased to 60/40.  S/p R therapeutic thoracentesis 02/25/22 yielding 350 ml fluid.  Echo 2/24 showed EF 55-60%, normal RV, mild MR, moderate-severe TR with dilated IVC.  TEE in 3/24 showed EF 55-60%, mild RV enlargement with mildly decreased RV systolic function, mild MR, severe TR (PPM lead not fused to leaflets), small PFO.   Today she returns with her husband for followup of CHF and severe TR.  She has been doing better over the last few months.  Weight is down 1 lb.  She was in [place] recently and did a lot of walking.  She is only dyspneic with inclines/stairs.  No orthopnea/PND.  No lightheadedness or palpitations.    ECG (personally reviewed): A-V dual pacing  Medtronic device interrogation: rare  atrial fibrillation, 97% v-paced  Labs (3/23): hgb 9.7 Labs (4/23): K 3.2, creatinine 0.92 Labs (6/23): creatinine 0.8  Labs (11/23): K 3.5, creatinine 0.7 Labs (1/24): K 4.1, creatinine 0.85 Labs (2/24): K 3.7, creatinine 0.95 Labs (3/24): K 3.3, creatinine 0.93, hgb 10.8  PMH: 1. Atrial fibrillation: Paroxysmal.  S/p AF (epicardial and endocardial) ablation in 10/13.  2. Tachy-brady syndrome: Medtronic PPM placed in 9/14.  3. H/o atypical atrial flutter 4. Hyperlipidemia 5. HTN 6. Hypothyroidism 7. Parkinsons disease: Followed by Dr. Arbutus Leas 8. Cirrhosis: ?NAFLD. No ETOH.  9. Chronic diastolic CHF/pulmonary hypertension:  - LHC (2014): No significant CAD.  - Cardiolite (10/21): EF 71%, no ischemia.  - Echo (3/23): EF 65-70%, mild LVH, normal RV, PASP 60 mmHg, moderate biatrial enlargement, mild MR, moderate TR.  - RHC (5/23): mean RA 8, PA 49/13 mean 30, mean PCWP 19 with v-waves to 30, CI 3.96, PVR 1.8 WU.  - RHC (2/24): RA mean 12, PA 53/24 (mean 39) PCWP mean 23 with prominent v-waves to 35, CO/CI (Fick) 6.75/4.23, PVR 2.4 WU - Echo (2/24): EF 55-60%, normal RV, mild MR, moderate-severe TR with dilated IVC. - TEE (3/24): EF 55-60%, mild RV enlargement with mildly decreased RV systolic function, mild MR, severe TR (PPM lead not fused to leaflets), small PFO.  10. B12 deficiency.  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   Social History   Socioeconomic History   Marital  status: Married    Spouse name: Not on file   Number of children: 2   Years of education: Not on file   Highest education level: Master's degree (e.g., MA, MS, MEng, MEd, MSW, MBA)  Occupational History   Not on file  Tobacco Use   Smoking status: Never   Smokeless tobacco: Never  Vaping Use   Vaping Use: Never used  Substance and Sexual Activity   Alcohol use: No   Drug use:  No   Sexual activity: Yes    Partners: Male    Birth control/protection: Post-menopausal  Other Topics Concern   Not on file  Social History Narrative   Lives in [place].  Retired Comptroller.   Right handed    Social Determinants of Health   Financial Resource Strain: Not on file  Food Insecurity: Not on file  Transportation Needs: Not on file  Physical Activity: Not on file  Stress: Not on file  Social Connections: Not on file  Intimate Partner Violence: Not on file   Family History  Problem Relation Age of Onset   Alzheimer's disease Mother    Heart failure Father    Healthy Child    Healthy Child    Breast cancer Maternal Aunt    ROS: All systems reviewed and negative except as per HPI.   Current Outpatient Medications  Medication Sig Dispense Refill   acetaminophen (TYLENOL) 500 MG tablet Take 1,000 mg by mouth at bedtime as needed for mild pain, moderate pain or headache.     Acetylcysteine (NAC 600) 600 MG CAPS Take 600 mg by mouth in the morning.     B-D 3CC LUER-LOK SYR 25GX1" 25G X 1" 3 ML MISC Inject 1 mL into the muscle once a week.     BELSOMRA 20 MG TABS Take 20 mg by mouth at bedtime as needed (sleep).     Calcium Citrate-Vitamin D (CITRACAL + D PO) Take 1 tablet by mouth every evening.     carbidopa-levodopa (SINEMET CR) 50-200 MG tablet TAKE ONE TABLET BY MOUTH EVERYDAY AT BEDTIME 90 tablet 0   carbidopa-levodopa (SINEMET IR) 25-100 MG tablet TAKE 2 TABLETS BY MOUTH 3 TIMES DAILY. 540 tablet 1   cholecalciferol (VITAMIN D3) 25 MCG (1000 UNIT) tablet Take 1,000 Units by mouth in the morning.     cyanocobalamin (,VITAMIN B-12,) 1000 MCG/ML injection INJECT INTRAMUSCULARLY IN THE DELTOID ONCE A MONTH (ALTERNATING DELT. EACH MONTH)     Cyanocobalamin (VITAMIN B12) 1000 MCG TBCR Take 1,000 mcg by mouth in the morning.     docusate sodium (COLACE) 100 MG capsule Take 100 mg by mouth in the morning.     ELIQUIS 5 MG TABS tablet TAKE ONE TABLET BY MOUTH AT  BREAKFAST AND AT BEDTIME 180 tablet 1   GLUTATHIONE PO Take 2 capsules by mouth in the morning.     levothyroxine (SYNTHROID) 50 MCG tablet Take 50-100 mcg by mouth See admin instructions. Take 2 tablets (100 mcg) by mouth on Mondays and Thursdays in the morning. Take 1 tablet (50 mcg) by mouth on Sundays, Tuesdays, Wednesdays, Fridays & Saturdays.     magnesium oxide (MAG-OX) 400 MG tablet Take 400 mg by mouth at bedtime.     metoprolol succinate (TOPROL-XL) 25 MG 24 hr tablet TAKE 1 TABLET BY MOUTH EVERY DAY (Patient taking differently: Take 25 mg by mouth daily. TAKE 1 TABLET BY MOUTH EVERY DAY) 90 tablet 3   multivitamin-lutein (OCUVITE-LUTEIN) CAPS capsule Take 1 capsule by  mouth in the morning.     NON FORMULARY Take 1 tablet by mouth at bedtime. MetaCalm Stress Regulation Formula     polycarbophil (FIBERCON) 625 MG tablet Take 1,250 mg by mouth in the morning.     potassium chloride (MICRO-K) 10 MEQ CR capsule TAKE 6 CAPSULES BY MOUTH AT breakfast & TAKE 6 CAPSULES AT lunchtime 240 capsule 3   Probiotic Product (PROBIOTIC PO) Take 1 capsule by mouth every evening.     rasagiline (AZILECT) 1 MG TABS tablet TAKE ONE TABLET BY MOUTH ONCE DAILY 90 tablet 0   spironolactone (ALDACTONE) 25 MG tablet Take 1 tablet (25 mg total) by mouth daily. 90 tablet 3   torsemide (DEMADEX) 20 MG tablet Take 3 tablets (60 mg total) by mouth daily with breakfast AND 2 tablets (40 mg total) every evening. 180 tablet 3   valACYclovir (VALTREX) 1000 MG tablet Take 1,000 mg by mouth 2 (two) times daily as needed (fever blisters).     vitamin C (ASCORBIC ACID) 500 MG tablet Take 500 mg by mouth in the morning.     No current facility-administered medications for this visit.   LMP 02/04/1996   Wt Readings from Last 3 Encounters:  06/23/22 57.2 kg (126 lb)  05/27/22 56.4 kg (124 lb 4.8 oz)  05/20/22 57.2 kg (126 lb)   General: NAD Neck: No JVD, no thyromegaly or thyroid nodule.  Lungs: Clear to auscultation  bilaterally with normal respiratory effort. CV: Nondisplaced PMI.  Heart regular S1/S2, no S3/S4, 1/6 HSM LLSB.  No peripheral edema.  No carotid bruit.  Normal pedal pulses.  Abdomen: Soft, nontender, no hepatosplenomegaly, no distention.  Skin: Intact without lesions or rashes.  Neurologic: Alert and oriented x 3.  Psych: Normal affect. Extremities: No clubbing or cyanosis.  HEENT: Normal.   Assessment/Plan: 1. Pulmonary hypertension:  RHC (1/24) showed moderate pulmonary venous hypertension with elevated PCWP (group 2 PH). 2. Chronic diastolic CHF: Echo in 3/23 with EF 65-70%, mild LVH, normal RV, PASP 60 mmHg, moderate biatrial enlargement, mild MR, moderate TR. RHC in 5/23 showed elevated PCWP with prominent v-waves.  Echo from 3/23 reviewed, no more than mild MR so suspect elevated v-waves due to diastolic dysfunction.  She had pulmonary venous hypertension. She underwent RHC again in 1/24 showing elevated R/L heart filling pressures, prominent V-waves in PCWP tracing, preserved CO and moderate pulmonary venous hypertension. Echo 2/24 showed stable EF 55-60%, normal RV, mild MR, moderate-severe TR with dilated IVC. TEE in 3/24 showed EF 55-60%, mild RV enlargement with mildly decreased RV systolic function, mild MR, severe TR (PPM lead not fused to leaflets), small PFO. She is not volume overloaded on exam today, NYHA class II symptoms. Doing better on current diuretic regimen.  - Increase spironolactone to 25 mg daily.  BMET/BNP today and BMET in 10 days.  - Continue torsemide 60 mg q am/40 mg q pm. - Off Farxiga due to persistent UTI.  3. Tricuspid regurgitation: Most recent echo 2/24 showed moderate to severe TR. TEE was done in 3/24, showing severe TR with ERO 0.43 cm^2 by PISA, regurgitant volume 50 cc, PPM does moves independently from leaflets and does not appear to be fused, there is mild RV dilation/mild RV dysfunction.  I suspect this plays a significant role in her CHF.  - I have  referred patient to [facility] to assess for candidacy for percutaneous tricuspid valve repair (Triclip or Evoque).  4. Atrial fibrillation: Paroxysmal. NSR today. She has periodic AF by  device interrogation.  She has had an epicardial and endocardial ablation in the past.  She failed Tikosyn in the past and did not tolerate amiodarone due to side effects.  - Continue Eliquis.  - Continue Toprol XL.  - Ongoing discussions with Dr. Elberta Fortis regarding redo AF ablation.  As she is in AF only rarely now, may be reasonable to hold off until we see if TR can be addressed. There is not a good anti-arrhythmic option for her.  5. Tachy-brady syndrome: Has MDT PPM. Had gen change 07/17/21 6. Cirrhosis: ?NAFLD.  Does not drink any ETOH now, was not a heavy drinker before.  - Followed by [facility].   Will make sure her information has made it to the [facility] for TR evaluation.  Followup in 6 wks with APP.   Anderson Malta [facility]  07/15/2022

## 2022-07-16 ENCOUNTER — Encounter (HOSPITAL_COMMUNITY): Payer: Self-pay

## 2022-07-16 ENCOUNTER — Ambulatory Visit (HOSPITAL_COMMUNITY)
Admission: RE | Admit: 2022-07-16 | Discharge: 2022-07-16 | Disposition: A | Discharge status: Home / Self Care | Payer: Medicare Other | Source: Ambulatory Visit | Attending: Family Medicine | Admitting: Family Medicine

## 2022-07-16 VITALS — BP 112/66 | HR 100 | Wt 130.4 lb

## 2022-07-16 DIAGNOSIS — I495 Sick sinus syndrome: Secondary | ICD-10-CM

## 2022-07-16 DIAGNOSIS — Z7901 Long term (current) use of anticoagulants: Secondary | ICD-10-CM | POA: Insufficient documentation

## 2022-07-16 DIAGNOSIS — H4050X3 Glaucoma secondary to other eye disorders, unspecified eye, severe stage: Secondary | ICD-10-CM | POA: Diagnosis not present

## 2022-07-16 DIAGNOSIS — I480 Paroxysmal atrial fibrillation: Secondary | ICD-10-CM | POA: Diagnosis not present

## 2022-07-16 DIAGNOSIS — H353221 Exudative age-related macular degeneration, left eye, with active choroidal neovascularization: Secondary | ICD-10-CM | POA: Diagnosis not present

## 2022-07-16 DIAGNOSIS — H43812 Vitreous degeneration, left eye: Secondary | ICD-10-CM | POA: Diagnosis not present

## 2022-07-16 DIAGNOSIS — Z950 Presence of cardiac pacemaker: Secondary | ICD-10-CM | POA: Insufficient documentation

## 2022-07-16 DIAGNOSIS — K7469 Other cirrhosis of liver: Secondary | ICD-10-CM | POA: Diagnosis not present

## 2022-07-16 DIAGNOSIS — D649 Anemia, unspecified: Secondary | ICD-10-CM | POA: Diagnosis not present

## 2022-07-16 DIAGNOSIS — I110 Hypertensive heart disease with heart failure: Secondary | ICD-10-CM | POA: Insufficient documentation

## 2022-07-16 DIAGNOSIS — H353132 Nonexudative age-related macular degeneration, bilateral, intermediate dry stage: Secondary | ICD-10-CM | POA: Diagnosis not present

## 2022-07-16 DIAGNOSIS — I071 Rheumatic tricuspid insufficiency: Secondary | ICD-10-CM

## 2022-07-16 DIAGNOSIS — I272 Pulmonary hypertension, unspecified: Secondary | ICD-10-CM

## 2022-07-16 DIAGNOSIS — K760 Fatty (change of) liver, not elsewhere classified: Secondary | ICD-10-CM | POA: Diagnosis not present

## 2022-07-16 DIAGNOSIS — Z8249 Family history of ischemic heart disease and other diseases of the circulatory system: Secondary | ICD-10-CM | POA: Diagnosis not present

## 2022-07-16 DIAGNOSIS — I4891 Unspecified atrial fibrillation: Secondary | ICD-10-CM | POA: Diagnosis not present

## 2022-07-16 DIAGNOSIS — H353211 Exudative age-related macular degeneration, right eye, with active choroidal neovascularization: Secondary | ICD-10-CM | POA: Diagnosis not present

## 2022-07-16 DIAGNOSIS — Z79899 Other long term (current) drug therapy: Secondary | ICD-10-CM | POA: Insufficient documentation

## 2022-07-16 DIAGNOSIS — I5032 Chronic diastolic (congestive) heart failure: Secondary | ICD-10-CM

## 2022-07-16 DIAGNOSIS — H35721 Serous detachment of retinal pigment epithelium, right eye: Secondary | ICD-10-CM | POA: Diagnosis not present

## 2022-07-16 LAB — CUP PACEART REMOTE DEVICE CHECK
Battery Remaining Longevity: 116 mo
Battery Voltage: 3.03 V
Brady Statistic AP VP Percent: 93.79 %
Brady Statistic AP VS Percent: 0.96 %
Brady Statistic AS VP Percent: 4.75 %
Brady Statistic AS VS Percent: 0.5 %
Brady Statistic RA Percent Paced: 92.2 %
Brady Statistic RV Percent Paced: 98.46 %
Date Time Interrogation Session: 20240612132731
Implantable Lead Connection Status: 753985
Implantable Lead Connection Status: 753985
Implantable Lead Implant Date: 20140917
Implantable Lead Implant Date: 20140917
Implantable Lead Location: 753859
Implantable Lead Location: 753860
Implantable Pulse Generator Implant Date: 20230614
Lead Channel Impedance Value: 304 Ohm
Lead Channel Impedance Value: 342 Ohm
Lead Channel Impedance Value: 361 Ohm
Lead Channel Impedance Value: 380 Ohm
Lead Channel Pacing Threshold Amplitude: 0.75 V
Lead Channel Pacing Threshold Amplitude: 0.75 V
Lead Channel Pacing Threshold Pulse Width: 0.4 ms
Lead Channel Pacing Threshold Pulse Width: 0.4 ms
Lead Channel Sensing Intrinsic Amplitude: 0.125 mV
Lead Channel Sensing Intrinsic Amplitude: 0.125 mV
Lead Channel Sensing Intrinsic Amplitude: 14.125 mV
Lead Channel Sensing Intrinsic Amplitude: 14.125 mV
Lead Channel Setting Pacing Amplitude: 1.5 V
Lead Channel Setting Pacing Amplitude: 2 V
Lead Channel Setting Pacing Pulse Width: 0.4 ms
Lead Channel Setting Sensing Sensitivity: 1.2 mV
Zone Setting Status: 755011

## 2022-07-16 LAB — CBC WITH DIFFERENTIAL/PLATELET
Abs Immature Granulocytes: 0.02 10*3/uL (ref 0.00–0.07)
Basophils Absolute: 0 10*3/uL (ref 0.0–0.1)
Basophils Relative: 1 %
Eosinophils Absolute: 0.1 10*3/uL (ref 0.0–0.5)
Eosinophils Relative: 1 %
HCT: 28.7 % — ABNORMAL LOW (ref 36.0–46.0)
Hemoglobin: 8.8 g/dL — ABNORMAL LOW (ref 12.0–15.0)
Immature Granulocytes: 0 %
Lymphocytes Relative: 15 %
Lymphs Abs: 0.9 10*3/uL (ref 0.7–4.0)
MCH: 25.9 pg — ABNORMAL LOW (ref 26.0–34.0)
MCHC: 30.7 g/dL (ref 30.0–36.0)
MCV: 84.4 fL (ref 80.0–100.0)
Monocytes Absolute: 0.8 10*3/uL (ref 0.1–1.0)
Monocytes Relative: 13 %
Neutro Abs: 4 10*3/uL (ref 1.7–7.7)
Neutrophils Relative %: 70 %
Platelets: 235 10*3/uL (ref 150–400)
RBC: 3.4 MIL/uL — ABNORMAL LOW (ref 3.87–5.11)
RDW: 16 % — ABNORMAL HIGH (ref 11.5–15.5)
WBC: 5.7 10*3/uL (ref 4.0–10.5)
nRBC: 0 % (ref 0.0–0.2)

## 2022-07-16 LAB — BASIC METABOLIC PANEL
Anion gap: 11 (ref 5–15)
BUN: 25 mg/dL — ABNORMAL HIGH (ref 8–23)
CO2: 29 mmol/L (ref 22–32)
Calcium: 9.3 mg/dL (ref 8.9–10.3)
Chloride: 98 mmol/L (ref 98–111)
Creatinine, Ser: 1.11 mg/dL — ABNORMAL HIGH (ref 0.44–1.00)
GFR, Estimated: 50 mL/min — ABNORMAL LOW (ref >60)
Glucose, Bld: 80 mg/dL (ref 70–99)
Potassium: 4 mmol/L (ref 3.5–5.1)
Sodium: 138 mmol/L (ref 135–145)

## 2022-07-16 LAB — BRAIN NATRIURETIC PEPTIDE: B Natriuretic Peptide: 78 pg/mL (ref 0.0–100.0)

## 2022-07-16 NOTE — Patient Instructions (Addendum)
Thank you for coming in today  If you had labs drawn today, any labs that are abnormal the clinic will call you No news is good news  Medications: Its ok to take extra Torsemide 20 mg  for 2 days   Follow up appointments:  Your physician recommends that you schedule a follow-up appointment in:  3 months With Dr. Shirlee Latch     Do the following things EVERYDAY: Weigh yourself in the morning before breakfast. Write it down and keep it in a log. Take your medicines as prescribed Eat low salt foods--Limit salt (sodium) to 2000 mg per day.  Stay as active as you can everyday Limit all fluids for the day to less than 2 liters   At the [facility], you and your health needs are our priority. As part of our continuing mission to provide you with exceptional heart care, we have created designated Provider Care Teams. These Care Teams include your primary Cardiologist (physician) and Advanced Practice Providers (APPs- Physician Assistants and Nurse Practitioners) who all work together to provide you with the care you need, when you need it.   You may see any of the following providers on your designated Care Team at your next follow up: Dr Arvilla Meres Dr Marca Ancona Dr. Marcos Eke, NP Robbie Lis, [place] [facility] [place], [place] Brynda Peon, NP Karle Plumber, PharmD   Please be sure to bring in all your medications bottles to every appointment.    Thank you for choosing [facility]  If you have any questions or concerns before your next appointment please send [place] a message through [place] or call our office at [phone].    TO LEAVE A MESSAGE FOR THE NURSE SELECT OPTION 2, PLEASE LEAVE A MESSAGE INCLUDING: YOUR NAME DATE OF BIRTH CALL BACK NUMBER REASON FOR CALL**this is important as we prioritize the call backs  YOU WILL RECEIVE A CALL BACK THE SAME DAY AS LONG AS YOU CALL BEFORE 4:00  PM

## 2022-07-17 ENCOUNTER — Ambulatory Visit (INDEPENDENT_AMBULATORY_CARE_PROVIDER_SITE_OTHER): Payer: Medicare Other

## 2022-07-17 DIAGNOSIS — I495 Sick sinus syndrome: Secondary | ICD-10-CM | POA: Diagnosis not present

## 2022-07-18 ENCOUNTER — Other Ambulatory Visit (HOSPITAL_COMMUNITY): Payer: Self-pay | Admitting: Cardiology

## 2022-07-18 MED ORDER — TORSEMIDE 20 MG PO TABS: 20.0000 mg | ORAL_TABLET | Freq: Every day | ORAL | 0 refills | Status: DC | PRN

## 2022-07-30 ENCOUNTER — Other Ambulatory Visit (HOSPITAL_COMMUNITY): Payer: Self-pay | Admitting: Family Medicine

## 2022-07-30 ENCOUNTER — Other Ambulatory Visit: Payer: Self-pay | Admitting: Neurology

## 2022-07-30 DIAGNOSIS — G20A1 Parkinson's disease without dyskinesia, without mention of fluctuations: Secondary | ICD-10-CM

## 2022-07-30 DIAGNOSIS — I495 Sick sinus syndrome: Secondary | ICD-10-CM | POA: Diagnosis not present

## 2022-07-30 DIAGNOSIS — I110 Hypertensive heart disease with heart failure: Secondary | ICD-10-CM | POA: Diagnosis not present

## 2022-07-30 DIAGNOSIS — R931 Abnormal findings on diagnostic imaging of heart and coronary circulation: Secondary | ICD-10-CM | POA: Diagnosis not present

## 2022-07-30 DIAGNOSIS — I361 Nonrheumatic tricuspid (valve) insufficiency: Secondary | ICD-10-CM | POA: Diagnosis not present

## 2022-07-30 DIAGNOSIS — G20B1 Parkinson's disease with dyskinesia, without mention of fluctuations: Secondary | ICD-10-CM

## 2022-07-30 DIAGNOSIS — I071 Rheumatic tricuspid insufficiency: Secondary | ICD-10-CM | POA: Diagnosis not present

## 2022-07-30 DIAGNOSIS — I371 Nonrheumatic pulmonary valve insufficiency: Secondary | ICD-10-CM | POA: Diagnosis not present

## 2022-07-30 DIAGNOSIS — I5032 Chronic diastolic (congestive) heart failure: Secondary | ICD-10-CM | POA: Diagnosis not present

## 2022-07-30 DIAGNOSIS — I517 Cardiomegaly: Secondary | ICD-10-CM | POA: Diagnosis not present

## 2022-07-31 ENCOUNTER — Other Ambulatory Visit (HOSPITAL_COMMUNITY): Payer: Self-pay | Admitting: Family Medicine

## 2022-08-05 NOTE — Progress Notes (Signed)
Remote pacemaker transmission.   

## 2022-08-14 ENCOUNTER — Ambulatory Visit
Admission: RE | Admit: 2022-08-14 | Discharge: 2022-08-14 | Disposition: A | Discharge status: Home / Self Care | Payer: Medicare Other | Source: Ambulatory Visit | Attending: Internal Medicine | Admitting: Internal Medicine

## 2022-08-14 DIAGNOSIS — Z1231 Encounter for screening mammogram for malignant neoplasm of breast: Secondary | ICD-10-CM

## 2022-08-15 ENCOUNTER — Other Ambulatory Visit (HOSPITAL_COMMUNITY): Payer: Self-pay | Admitting: Family Medicine

## 2022-08-25 ENCOUNTER — Other Ambulatory Visit: Payer: Self-pay | Admitting: Cardiology

## 2022-08-25 ENCOUNTER — Other Ambulatory Visit (HOSPITAL_COMMUNITY): Payer: Self-pay | Admitting: Family Medicine

## 2022-09-02 ENCOUNTER — Telehealth (HOSPITAL_COMMUNITY): Payer: Self-pay

## 2022-09-02 NOTE — Telephone Encounter (Signed)
Patient called stating that Dr. Idelle Leech was suppose to call you in regards to her PPM. He states they need to clip her Tricuspid valve but can't due to one of her leads being on top of it. She wanted to know if you heard from him yet.

## 2022-09-03 NOTE — Telephone Encounter (Signed)
I spoke with Dr. Art Buff in [place] about this.  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  He recommended continued medical treatment for now, would not recommend device extraction and Triclip at this point.  They would be willing to see her again if symptoms and TR worsen.  However, for Triclip, we would have to extract her pacemaker and put in a leadless pacemaker and they thought that risk/benefit ratio currently outweighs doing this.

## 2022-09-03 NOTE — Telephone Encounter (Signed)
Returned call to patient and made her aware of the below from Dr. Shirlee Latch   Advised patient to call back to office with any issues, questions, or concerns. Patient verbalized understanding.

## 2022-09-10 DIAGNOSIS — H353231 Exudative age-related macular degeneration, bilateral, with active choroidal neovascularization: Secondary | ICD-10-CM | POA: Diagnosis not present

## 2022-09-10 DIAGNOSIS — H43812 Vitreous degeneration, left eye: Secondary | ICD-10-CM | POA: Diagnosis not present

## 2022-09-10 DIAGNOSIS — H35721 Serous detachment of retinal pigment epithelium, right eye: Secondary | ICD-10-CM | POA: Diagnosis not present

## 2022-09-10 DIAGNOSIS — H4050X3 Glaucoma secondary to other eye disorders, unspecified eye, severe stage: Secondary | ICD-10-CM | POA: Diagnosis not present

## 2022-09-10 DIAGNOSIS — H353132 Nonexudative age-related macular degeneration, bilateral, intermediate dry stage: Secondary | ICD-10-CM | POA: Diagnosis not present

## 2022-10-01 DIAGNOSIS — Z23 Encounter for immunization: Secondary | ICD-10-CM | POA: Diagnosis not present

## 2022-10-13 ENCOUNTER — Other Ambulatory Visit: Payer: Self-pay | Admitting: Nurse Practitioner

## 2022-10-13 ENCOUNTER — Other Ambulatory Visit: Payer: Self-pay

## 2022-10-13 DIAGNOSIS — K7469 Other cirrhosis of liver: Secondary | ICD-10-CM

## 2022-10-13 DIAGNOSIS — R188 Other ascites: Secondary | ICD-10-CM | POA: Diagnosis not present

## 2022-10-13 DIAGNOSIS — G20B1 Parkinson's disease with dyskinesia, without mention of fluctuations: Secondary | ICD-10-CM

## 2022-10-13 DIAGNOSIS — Z9189 Other specified personal risk factors, not elsewhere classified: Secondary | ICD-10-CM | POA: Diagnosis not present

## 2022-10-13 DIAGNOSIS — G20A1 Parkinson's disease without dyskinesia, without mention of fluctuations: Secondary | ICD-10-CM

## 2022-10-13 MED ORDER — RASAGILINE MESYLATE 1 MG PO TABS: 1.0000 mg | ORAL_TABLET | Freq: Every day | ORAL | 0 refills | Status: DC

## 2022-10-13 MED ORDER — CARBIDOPA-LEVODOPA 25-100 MG PO TABS: ORAL_TABLET | ORAL | 0 refills | Status: DC

## 2022-10-13 MED ORDER — CARBIDOPA-LEVODOPA ER 50-200 MG PO TBCR: EXTENDED_RELEASE_TABLET | ORAL | 0 refills | Status: DC

## 2022-10-15 DIAGNOSIS — L821 Other seborrheic keratosis: Secondary | ICD-10-CM | POA: Diagnosis not present

## 2022-10-15 DIAGNOSIS — Z85828 Personal history of other malignant neoplasm of skin: Secondary | ICD-10-CM | POA: Diagnosis not present

## 2022-10-15 DIAGNOSIS — L570 Actinic keratosis: Secondary | ICD-10-CM | POA: Diagnosis not present

## 2022-10-16 ENCOUNTER — Ambulatory Visit (INDEPENDENT_AMBULATORY_CARE_PROVIDER_SITE_OTHER): Payer: Medicare Other

## 2022-10-16 DIAGNOSIS — I495 Sick sinus syndrome: Secondary | ICD-10-CM | POA: Diagnosis not present

## 2022-10-16 LAB — CUP PACEART REMOTE DEVICE CHECK
Battery Remaining Longevity: 113 mo
Battery Voltage: 3.02 V
Brady Statistic AP VP Percent: 91.52 %
Brady Statistic AP VS Percent: 2.72 %
Brady Statistic AS VP Percent: 4.7 %
Brady Statistic AS VS Percent: 1.07 %
Brady Statistic RA Percent Paced: 93.08 %
Brady Statistic RV Percent Paced: 96.1 %
Date Time Interrogation Session: 20240911225700
Implantable Lead Connection Status: 753985
Implantable Lead Connection Status: 753985
Implantable Lead Implant Date: 20140917
Implantable Lead Implant Date: 20140917
Implantable Lead Location: 753859
Implantable Lead Location: 753860
Implantable Pulse Generator Implant Date: 20230614
Lead Channel Impedance Value: 323 Ohm
Lead Channel Impedance Value: 361 Ohm
Lead Channel Impedance Value: 361 Ohm
Lead Channel Impedance Value: 361 Ohm
Lead Channel Pacing Threshold Amplitude: 0.75 V
Lead Channel Pacing Threshold Amplitude: 0.75 V
Lead Channel Pacing Threshold Pulse Width: 0.4 ms
Lead Channel Pacing Threshold Pulse Width: 0.4 ms
Lead Channel Sensing Intrinsic Amplitude: 0.25 mV
Lead Channel Sensing Intrinsic Amplitude: 0.25 mV
Lead Channel Sensing Intrinsic Amplitude: 13.875 mV
Lead Channel Sensing Intrinsic Amplitude: 13.875 mV
Lead Channel Setting Pacing Amplitude: 1.5 V
Lead Channel Setting Pacing Amplitude: 2 V
Lead Channel Setting Pacing Pulse Width: 0.4 ms
Lead Channel Setting Sensing Sensitivity: 1.2 mV
Zone Setting Status: 755011

## 2022-10-24 NOTE — Progress Notes (Unsigned)
Assessment/Plan:   1.  Parkinsons Disease  - continue carbidopa/levodopa 25/100, 2 tablets three times per day and move dosages to 8am/noon/4pm  -stop carbidopa/levodopa CR at night as not helping cramping  -add rytary 145 mg, 2 po q hs.  Call me and let me know how it does  -Continue rasagiline, 1 mg daily  -Has moderate Parkinson's dyskinesia.  She doesn't want to look at rytary (during day)/crexont or add amantadine.    -PT referral  2.  RBD/insomnia  -she is no longer on klonopin but has noted that dreaming vivid again.  Told her dreams are back as klonopin was d/c when PCP started belsomra.  She forgot about that but is going to mention to pcp although is sleeping better.  PDMP reviewed 3.  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 4.  Atrial fibrillation  -Follows with cardiology.  On Eliquis.  -Patient with history of tachybradycardia syndrome and follows with cardiology for this as well.  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   Subjective:   [name] [name] was seen today in follow up for Parkinsons disease.  My previous records were reviewed prior to todays visit as well as outside records available to me.  She is with her husband who supplements the history.  She had a TEE on June 26 to see if she was a candidate for transcatheter therapies for severe tricuspid insufficiency, but she was not. They state that she is getting more SOB and endurance is really poor due to it, esp over the last 45 days.   In regards to her Parkinson's disease, she needs to manage pretty well.  She had one fall this summer -tripped outside in driveway.  Her sunglasses saved her face.  She is more unbalanced.  She is noting more dreams.  They are vivid.  More cramping at night as well.   Current prescribed movement disorder medications: Carbidopa/levodopa 25/100, 2 tablets 3 times per day  Carbidopa/levodopa 50/200 at  bedtime azilect    ALLERGIES:   Allergies  Allergen Reactions   Amiodarone Other (See Comments)    Made pt feel terrible    Statins Other (See Comments)    Leg weakness - but tolerating low dose Crestor every other day    CURRENT MEDICATIONS:  Outpatient Encounter Medications as of 10/28/2022  Medication Sig   acetaminophen (TYLENOL) 500 MG tablet Take 1,000 mg by mouth at bedtime as needed for mild pain, moderate pain or headache.   Acetylcysteine (NAC 600) 600 MG CAPS Take 600 mg by mouth in the morning.   B-D 3CC LUER-LOK SYR 25GX1" 25G X 1" 3 ML MISC Inject 1 mL into the muscle once a week.   BELSOMRA 20 MG TABS Take 20 mg by mouth at bedtime as needed (sleep).   Calcium Citrate-Vitamin D (CITRACAL + D PO) Take 1 tablet by mouth every evening.   carbidopa-levodopa (SINEMET CR) 50-200 MG tablet TAKE ONE TABLET BY MOUTH EVERYDAY AT BEDTIME   carbidopa-levodopa (SINEMET IR) 25-100 MG tablet TAKE 2 TABLETS BY MOUTH 3 TIMES DAILY.   cholecalciferol (VITAMIN D3) 25 MCG (1000 UNIT) tablet Take 1,000 Units by mouth in the morning.   cyanocobalamin (,VITAMIN B-12,) 1000 MCG/ML injection INJECT INTRAMUSCULARLY IN THE DELTOID ONCE A MONTH (ALTERNATING DELT. EACH MONTH)   Cyanocobalamin (VITAMIN B12) 1000 MCG TBCR Take 1,000 mcg by mouth in the morning.   docusate sodium (COLACE) 100 MG capsule Take  100 mg by mouth 2 (two) times daily.   ELIQUIS 5 MG TABS tablet TAKE ONE TABLET BY MOUTH AT BREAKFAST AND AT BEDTIME   GLUTATHIONE PO Take 2 capsules by mouth in the morning.   levothyroxine (SYNTHROID) 50 MCG tablet Take 50-100 mcg by mouth See admin instructions. Take 2 tablets (100 mcg) by mouth on Mondays and Thursdays in the morning. Take 1 tablet (50 mcg) by mouth on Sundays, Tuesdays, Wednesdays, Fridays & Saturdays.   magnesium oxide (MAG-OX) 400 MG tablet Take 400 mg by mouth at bedtime.   metoprolol succinate (TOPROL-XL) 25 MG 24 hr tablet TAKE ONE TABLET BY MOUTH EVERYDAY AT BEDTIME    multivitamin-lutein (OCUVITE-LUTEIN) CAPS capsule Take 1 capsule by mouth in the morning.   NON FORMULARY Take 1 tablet by mouth at bedtime. MetaCalm Stress Regulation Formula   polycarbophil (FIBERCON) 625 MG tablet Take 1,250 mg by mouth in the morning.   potassium chloride (MICRO-K) 10 MEQ CR capsule TAKE 6 CAPSULES BY MOUTH EVERY MORNING and TAKE 6 CAPSULES BY MOUTH AT NOON   Probiotic Product (PROBIOTIC PO) Take 1 capsule by mouth every evening.   rasagiline (AZILECT) 1 MG TABS tablet Take 1 tablet (1 mg total) by mouth daily.   spironolactone (ALDACTONE) 25 MG tablet Take 2 tablets (50 mg total) by mouth daily.   torsemide (DEMADEX) 20 MG tablet Take 4 tablets (80 mg total) by mouth every morning AND 3 tablets (60 mg total) every evening.   valACYclovir (VALTREX) 1000 MG tablet Take 1,000 mg by mouth 2 (two) times daily as needed (fever blisters).   vitamin C (ASCORBIC ACID) 500 MG tablet Take 500 mg by mouth in the morning.   [DISCONTINUED] spironolactone (ALDACTONE) 25 MG tablet Take 1 tablet (25 mg total) by mouth daily.   [DISCONTINUED] torsemide (DEMADEX) 20 MG tablet TAKE THREE TABLETS BY MOUTH EVERY MORNING and TAKE TWO TABLETS BY MOUTH AT NOON   [DISCONTINUED] torsemide (DEMADEX) 20 MG tablet Take 1 tablet (20 mg total) by mouth as needed.   No facility-administered encounter medications on file as of 10/28/2022.    Objective:   PHYSICAL EXAMINATION:    VITALS:   Vitals:   10/28/22 1057  BP: 132/84  Pulse: (!) 109  SpO2: 96%  Weight: 129 lb 6.4 oz (58.7 kg)  Height: 5\' 1"  (1.549 m)    Wt Readings from Last 3 Encounters:  10/28/22 129 lb 6.4 oz (58.7 kg)  10/27/22 130 lb 12.8 oz (59.3 kg)  07/16/22 130 lb 6.4 oz (59.1 kg)   GEN:  The patient appears stated age and is in NAD. HEENT:  Normocephalic.  Area of healing ecchymosis on the R eye, in various stages of healing.  The mucous membranes are moist. The superficial temporal arteries are without ropiness or  tenderness.   Neurological examination:  Orientation: The patient is alert and oriented x3. Cranial nerves: There is good facial symmetry with facial hypomimia. The speech is fluent and clear. Soft palate rises symmetrically and there is no tongue deviation. Hearing is intact to conversational tone. Sensation: Sensation is intact to light touch throughout Motor: Strength is at least antigravity x4.  Movement examination: Tone: There is nl tone in the ue/le Abnormal movements: at least mod dyskinesia in the L>>R leg Coordination:  There is mild to mod dyskinesia in the legs Gait and Station: The patient's stride length is good.  Ambulates with good speed and good arm swing.  The patient has a neg pull test.  Total time spent on today's visit was 41 minutes, including both face-to-face time and nonface-to-face time.  Time included that spent on review of records (prior notes available to me/labs/imaging if pertinent), discussing treatment and goals, answering patient's questions and coordinating care.  Cc:  Rodrigo Ran, MD

## 2022-10-27 ENCOUNTER — Encounter (HOSPITAL_COMMUNITY): Payer: Self-pay | Admitting: Cardiology

## 2022-10-27 ENCOUNTER — Encounter (HOSPITAL_COMMUNITY): Payer: Self-pay

## 2022-10-27 ENCOUNTER — Ambulatory Visit (HOSPITAL_COMMUNITY)
Admission: RE | Admit: 2022-10-27 | Discharge: 2022-10-27 | Disposition: A | Discharge status: Home / Self Care | Payer: Medicare Other | Source: Ambulatory Visit | Attending: Cardiology | Admitting: Cardiology

## 2022-10-27 VITALS — BP 110/70 | HR 87 | Wt 130.8 lb

## 2022-10-27 DIAGNOSIS — R9431 Abnormal electrocardiogram [ECG] [EKG]: Secondary | ICD-10-CM | POA: Insufficient documentation

## 2022-10-27 DIAGNOSIS — I480 Paroxysmal atrial fibrillation: Secondary | ICD-10-CM | POA: Diagnosis not present

## 2022-10-27 DIAGNOSIS — I110 Hypertensive heart disease with heart failure: Secondary | ICD-10-CM | POA: Insufficient documentation

## 2022-10-27 DIAGNOSIS — D649 Anemia, unspecified: Secondary | ICD-10-CM | POA: Diagnosis not present

## 2022-10-27 DIAGNOSIS — G20A1 Parkinson's disease without dyskinesia, without mention of fluctuations: Secondary | ICD-10-CM | POA: Insufficient documentation

## 2022-10-27 DIAGNOSIS — I272 Pulmonary hypertension, unspecified: Secondary | ICD-10-CM | POA: Diagnosis not present

## 2022-10-27 DIAGNOSIS — I495 Sick sinus syndrome: Secondary | ICD-10-CM | POA: Diagnosis not present

## 2022-10-27 DIAGNOSIS — K7460 Unspecified cirrhosis of liver: Secondary | ICD-10-CM | POA: Diagnosis not present

## 2022-10-27 DIAGNOSIS — Z950 Presence of cardiac pacemaker: Secondary | ICD-10-CM | POA: Diagnosis not present

## 2022-10-27 DIAGNOSIS — I5032 Chronic diastolic (congestive) heart failure: Secondary | ICD-10-CM | POA: Insufficient documentation

## 2022-10-27 DIAGNOSIS — I361 Nonrheumatic tricuspid (valve) insufficiency: Secondary | ICD-10-CM | POA: Insufficient documentation

## 2022-10-27 MED ORDER — SPIRONOLACTONE 25 MG PO TABS: 50.0000 mg | ORAL_TABLET | Freq: Every day | ORAL | 3 refills | Status: DC

## 2022-10-27 MED ORDER — TORSEMIDE 20 MG PO TABS: ORAL_TABLET | ORAL | 3 refills | Status: DC

## 2022-10-27 NOTE — Progress Notes (Signed)
Remote pacemaker transmission.   

## 2022-10-27 NOTE — Progress Notes (Signed)
PCP: Rodrigo Ran, MD EP: Dr. Elberta Fortis HF Cardiology: Dr. Shirlee Latch  82 y.o. 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  Patient had an echo done in 3/23 showing EF 65-70%, mild LVH, normal RV, PASP 60 mmHg, moderate biatrial enlargement, mild MR, moderate TR. Prior echoes had shown mildly elevated PA pressure. Cardiolite in 10/21 showed no ischemia.   RHC was done in 5/23 showing mild pulmonary venous hypertension with mildly elevated PCWP. She was started on Farxiga.  She developed a persistent UTI that was difficult to clear and stopped [place].   Acute follow up 02/20/22 for dyspnea. CTA chest showed no PE, moderate right pleural effusion, mild pulmonary edema. NYHA III symptoms and volume overloaded, she was given IV lasix 80 mg x 1 then instructed to use [name] daily x 2 days, then switch from Lasix to torsemide 60 mg daily. RHC arranged which showed elevated R/L heart filling pressures, prominent V waves in PCWP tracing, preserved CO and moderate pulmonary venous hypertension. Echo arranged to assess MR and torsemide increased to 60/40.  S/p R therapeutic thoracentesis 02/25/22 yielding 350 ml fluid.  Echo 2/24 showed EF 55-60%, normal RV, mild MR, moderate-severe TR with dilated IVC.  TEE in 3/24 showed EF 55-60%, mild RV enlargement with mildly decreased RV systolic function, mild MR, severe TR (PPM lead not fused to leaflets), small PFO.   Follow up with EP 06/23/22, device interrogation showing 8% burden of AT/AF. Planning to re-evaluate after possible TR intervention.  Saw Dr. Art Buff @ [facility] in 5/24 and had workup for percutaneous tricuspid interventions (Triclip, Evoque valve).  She was determined not to be a candidate for any of the available tricuspid interventions.    Today she returns for HF follow up  with her husband.  She has been gradually feeling worse.  She is short of breath walking up hills or walking about 100 feet.  She fatigues easily.  No orthopnea/PND.  No chest pain.  Weight is stable.   ECG (personally reviewed): A-V dual paced  Medtronic device interrogation (personally reviewed): Rare short AF runs, 98% v-pacing  Labs (3/23): hgb 9.7 Labs (4/23): K 3.2, creatinine 0.92 Labs (6/23): creatinine 0.8  Labs (11/23): K 3.5, creatinine 0.7 Labs (1/24): K 4.1, creatinine 0.85 Labs (2/24): K 3.7, creatinine 0.95 Labs (3/24): K 3.3, creatinine 0.93, hgb 10.8 Labs (4/24): K 4.0, creatinine 1.10 Labs (6/24): BNP 78 Labs (9/24): K 4.6, creatinine 1.02, LFTs normal  PMH: 1. Atrial fibrillation: Paroxysmal.  S/p AF (epicardial and endocardial) ablation in 10/13.  2. Tachy-brady syndrome: Medtronic PPM placed in 9/14.  3. H/o atypical atrial flutter 4. Hyperlipidemia 5. HTN 6. Hypothyroidism 7. Parkinsons disease: Followed by Dr. Arbutus Leas 8. Cirrhosis: ?NAFLD. No ETOH.  9. Chronic diastolic CHF/pulmonary hypertension:  - LHC (2014): No significant CAD.  - Cardiolite (10/21): EF 71%, no ischemia.  - Echo (3/23): EF 65-70%, mild LVH, normal RV, PASP 60 mmHg, moderate biatrial enlargement, mild MR, moderate TR.  - RHC (5/23): mean RA 8, PA 49/13 mean 30, mean PCWP 19 with v-waves to 30, CI 3.96, PVR 1.8 WU.  - RHC (2/24): RA mean 12, PA 53/24 (mean 39) PCWP mean 23 with prominent v-waves to 35, CO/CI (Fick) 6.75/4.23, PVR 2.4 WU - Echo (2/24): EF 55-60%, normal RV, mild MR, moderate-severe TR with dilated IVC. - TEE (3/24): EF 55-60%, mild RV  enlargement with mildly decreased RV systolic function, mild MR, severe TR (PPM lead not fused to leaflets), small PFO.  10. B12 deficiency.  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  - Seen at [facility] in  [place], not candidate for percutaneous tricuspid repair.   Social History   Socioeconomic History   Marital status: Married    Spouse name: Not on file   Number of children: 2   Years of education: Not on file   Highest education level: Master's degree (e.g., MA, MS, MEng, MEd, MSW, MBA)  Occupational History   Not on file  Tobacco Use   Smoking status: Never   Smokeless tobacco: Never  Vaping Use   Vaping status: Never Used  Substance and Sexual Activity   Alcohol use: No   Drug use: No   Sexual activity: Yes    Partners: Male    Birth control/protection: Post-menopausal  Other Topics Concern   Not on file  Social History Narrative   Lives in [place].  Retired Comptroller.   Right handed    Social Determinants of Health   Financial Resource Strain: Not on file  Food Insecurity: Low Risk  (03/11/2022)   [facility]   Hunger Vital Sign    Worried About Running Out of Food in the Last Year: Never true    Ran Out of Food in the Last Year: Never true  Transportation Needs: Not on file (03/11/2022)  Physical Activity: Not on file  Stress: Not on file  Social Connections: Not on file  Intimate Partner Violence: Not on file   Family History  Problem Relation Age of Onset   Alzheimer's disease Mother    Heart failure Father    Healthy Child    Healthy Child    Breast cancer Maternal Aunt    ROS: All systems reviewed and negative except as per HPI.   Current Outpatient Medications  Medication Sig Dispense Refill   acetaminophen (TYLENOL) 500 MG tablet Take 1,000 mg by mouth at bedtime as needed for mild pain, moderate pain or headache.     Acetylcysteine (NAC 600) 600 MG CAPS Take 600 mg by mouth in the morning.     B-D 3CC LUER-LOK SYR 25GX1" 25G X 1" 3 ML MISC Inject 1 mL into the muscle once a week.     BELSOMRA 20 MG TABS Take 20 mg by mouth at bedtime as needed (sleep).     Calcium Citrate-Vitamin D (CITRACAL + D PO) Take 1 tablet  by mouth every evening.     carbidopa-levodopa (SINEMET CR) 50-200 MG tablet TAKE ONE TABLET BY MOUTH EVERYDAY AT BEDTIME 90 tablet 0   carbidopa-levodopa (SINEMET IR) 25-100 MG tablet TAKE 2 TABLETS BY MOUTH 3 TIMES DAILY. 540 tablet 0   cholecalciferol (VITAMIN D3) 25 MCG (1000 UNIT) tablet Take 1,000 Units by mouth in the morning.     cyanocobalamin (,VITAMIN B-12,) 1000 MCG/ML injection INJECT INTRAMUSCULARLY IN THE DELTOID ONCE A MONTH (ALTERNATING DELT. EACH MONTH)     Cyanocobalamin (VITAMIN B12) 1000 MCG TBCR Take 1,000 mcg by mouth in the morning.     docusate sodium (COLACE) 100 MG capsule Take 100 mg by mouth 2 (two) times daily.     ELIQUIS 5 MG TABS tablet TAKE ONE TABLET BY MOUTH AT BREAKFAST AND AT BEDTIME 180 tablet  1   GLUTATHIONE PO Take 2 capsules by mouth in the morning.     levothyroxine (SYNTHROID) 50 MCG tablet Take 50-100 mcg by mouth See admin instructions. Take 2 tablets (100 mcg) by mouth on Mondays and Thursdays in the morning. Take 1 tablet (50 mcg) by mouth on Sundays, Tuesdays, Wednesdays, Fridays & Saturdays.     magnesium oxide (MAG-OX) 400 MG tablet Take 400 mg by mouth at bedtime.     metoprolol succinate (TOPROL-XL) 25 MG 24 hr tablet TAKE ONE TABLET BY MOUTH EVERYDAY AT BEDTIME 90 tablet 2   multivitamin-lutein (OCUVITE-LUTEIN) CAPS capsule Take 1 capsule by mouth in the morning.     NON FORMULARY Take 1 tablet by mouth at bedtime. MetaCalm Stress Regulation Formula     polycarbophil (FIBERCON) 625 MG tablet Take 1,250 mg by mouth in the morning.     potassium chloride (MICRO-K) 10 MEQ CR capsule TAKE 6 CAPSULES BY MOUTH EVERY MORNING and TAKE 6 CAPSULES BY MOUTH AT NOON 240 capsule 3   Probiotic Product (PROBIOTIC PO) Take 1 capsule by mouth every evening.     rasagiline (AZILECT) 1 MG TABS tablet Take 1 tablet (1 mg total) by mouth daily. 90 tablet 0   valACYclovir (VALTREX) 1000 MG tablet Take 1,000 mg by mouth 2 (two) times daily as needed (fever  blisters).     vitamin C (ASCORBIC ACID) 500 MG tablet Take 500 mg by mouth in the morning.     spironolactone (ALDACTONE) 25 MG tablet Take 2 tablets (50 mg total) by mouth daily. 180 tablet 3   torsemide (DEMADEX) 20 MG tablet Take 4 tablets (80 mg total) by mouth every morning AND 3 tablets (60 mg total) every evening. 300 tablet 3   No current facility-administered medications for this encounter.   BP 110/70   Pulse 87   Wt 59.3 kg (130 lb 12.8 oz)   LMP 02/04/1996   SpO2 99%   BMI 23.17 kg/m   Wt Readings from Last 3 Encounters:  10/27/22 59.3 kg (130 lb 12.8 oz)  07/16/22 59.1 kg (130 lb 6.4 oz)  06/23/22 57.2 kg (126 lb)   General: NAD Neck: JVP 8-9 cm, no thyromegaly or thyroid nodule.  Lungs: Clear to auscultation bilaterally with normal respiratory effort. CV: Nondisplaced PMI.  Heart regular S1/S2, no S3/S4, 1/6 HSM LLSB.  Trace ankle edema.  No carotid bruit.  Normal pedal pulses.  Abdomen: Soft, nontender, no hepatosplenomegaly, no distention.  Skin: Intact without lesions or rashes.  Neurologic: Alert and oriented x 3.  Psych: Normal affect. Extremities: No clubbing or cyanosis.  HEENT: Normal.   Assessment/Plan: 1. Pulmonary hypertension:  RHC (1/24) showed moderate pulmonary venous hypertension with elevated PCWP (group 2 PH). 2. Chronic diastolic CHF: Echo in 3/23 with EF 65-70%, mild LVH, normal RV, PASP 60 mmHg, moderate biatrial enlargement, mild MR, moderate TR. RHC in 5/23 showed elevated PCWP with prominent v-waves.  Echo from 3/23 reviewed, no more than mild MR so suspect elevated v-waves due to diastolic dysfunction.  She had pulmonary venous hypertension. She underwent RHC again in 1/24 showing elevated R/L heart filling pressures, prominent V-waves in PCWP tracing, preserved CO and moderate pulmonary venous hypertension. Echo 2/24 showed stable EF 55-60%, normal RV, mild MR, moderate-severe TR with dilated IVC. TEE in 3/24 showed EF 55-60%, mild RV  enlargement with mildly decreased RV systolic function, mild MR, severe TR (PPM lead not fused to leaflets), small PFO. NYHA class III symptoms, gradually worse.  She is volume overloaded on exam.  - Increase torsemide to 80 qam/60 qpm and increase spironolactone to 50 mg daily. BMET/BNP in 10 days.  - Off Marcelline Deist due to persistent UTI.  3. Tricuspid regurgitation: Echo 2/24 showed moderate to severe TR. TEE was done in 3/24, showing severe TR with ERO 0.43 cm^2 by PISA, regurgitant volume 50 cc, PPM does moves independently from leaflets and does not appear to be fused, there is mild RV dilation/mild RV dysfunction.  I suspect TR plays a significant role in her CHF.  She had evaluation at [facility] clinic and was determined not to be a suitable candidate for tricuspid valve percutaneous repair (Triclip or Evoque).  - Continue medical management.  4. Atrial fibrillation: Paroxysmal. She has had an epicardial and endocardial ablation in the past.  She failed Tikosyn in the past and did not tolerate amiodarone due to side effects.  She is in NSR today, device shows occasional short AF.  - Continue Eliquis.  - Continue Toprol XL.  - Follow with Dr. Elberta Fortis, would consider AF ablation.  5. Tachy-brady syndrome: Has MDT PPM. Had gen change 07/17/21 6. Cirrhosis: ?NAFLD.  Does not drink any ETOH now, was not a heavy drinker before.  - Followed by [facility].  7. Anemia, chronic. She is followed by Dr. Pamelia Hoit.   Follow up in 1 month with APP.   Marca Ancona  10/27/2022

## 2022-10-27 NOTE — Patient Instructions (Signed)
INCREASE Torsemide to 80 mg in the morning and 60 mg in the evening.  INCREASE Spironolactone to 50 mg daily.  Lab work in 10 days.  Your physician recommends that you schedule a follow-up appointment in: 1 month.  If you have any questions or concerns before your next appointment please send [place] a message through [place] or call our office at [phone].    TO LEAVE A MESSAGE FOR THE NURSE SELECT OPTION 2, PLEASE LEAVE A MESSAGE INCLUDING: YOUR NAME DATE OF BIRTH CALL BACK NUMBER REASON FOR CALL**this is important as we prioritize the call backs  YOU WILL RECEIVE A CALL BACK THE SAME DAY AS LONG AS YOU CALL BEFORE 4:00 PM  At the [facility], you and your health needs are our priority. As part of our continuing mission to provide you with exceptional heart care, we have created designated Provider Care Teams. These Care Teams include your primary Cardiologist (physician) and Advanced Practice Providers (APPs- Physician Assistants and Nurse Practitioners) who all work together to provide you with the care you need, when you need it.   You may see any of the following providers on your designated Care Team at your next follow up: Dr Arvilla Meres Dr Marca Ancona Dr. Marcos Eke, NP Robbie Lis, [place] [facility] [place], [place] Brynda Peon, NP Karle Plumber, PharmD   Please be sure to bring in all your medications bottles to every appointment.    Thank you for choosing [facility]

## 2022-10-28 ENCOUNTER — Encounter: Payer: Self-pay | Admitting: Neurology

## 2022-10-28 ENCOUNTER — Ambulatory Visit (INDEPENDENT_AMBULATORY_CARE_PROVIDER_SITE_OTHER): Payer: Medicare Other | Admitting: Neurology

## 2022-10-28 VITALS — BP 132/84 | HR 109 | Ht 61.0 in | Wt 129.4 lb

## 2022-10-28 DIAGNOSIS — G20B2 Parkinson's disease with dyskinesia, with fluctuations: Secondary | ICD-10-CM

## 2022-10-28 DIAGNOSIS — G4752 REM sleep behavior disorder: Secondary | ICD-10-CM

## 2022-10-28 MED ORDER — RYTARY 36.25-145 MG PO CPCR: ORAL_CAPSULE | ORAL | 0 refills | Status: DC

## 2022-10-28 MED ORDER — RYTARY 36.25-145 MG PO CPCR: ORAL_CAPSULE | ORAL | Status: DC

## 2022-10-28 NOTE — Patient Instructions (Signed)
HOLD carbidopa/levodopa 50/200 at bed Start rytary 145, 2 capsules at bed

## 2022-10-29 ENCOUNTER — Ambulatory Visit
Admission: RE | Admit: 2022-10-29 | Discharge: 2022-10-29 | Disposition: A | Discharge status: Home / Self Care | Payer: Medicare Other | Source: Ambulatory Visit | Attending: Nurse Practitioner | Admitting: Nurse Practitioner

## 2022-10-29 DIAGNOSIS — K7460 Unspecified cirrhosis of liver: Secondary | ICD-10-CM | POA: Diagnosis not present

## 2022-10-29 DIAGNOSIS — K7469 Other cirrhosis of liver: Secondary | ICD-10-CM

## 2022-10-29 DIAGNOSIS — R188 Other ascites: Secondary | ICD-10-CM

## 2022-11-05 DIAGNOSIS — H353211 Exudative age-related macular degeneration, right eye, with active choroidal neovascularization: Secondary | ICD-10-CM | POA: Diagnosis not present

## 2022-11-05 DIAGNOSIS — H4052X3 Glaucoma secondary to other eye disorders, left eye, severe stage: Secondary | ICD-10-CM | POA: Diagnosis not present

## 2022-11-05 DIAGNOSIS — H43812 Vitreous degeneration, left eye: Secondary | ICD-10-CM | POA: Diagnosis not present

## 2022-11-05 DIAGNOSIS — H35721 Serous detachment of retinal pigment epithelium, right eye: Secondary | ICD-10-CM | POA: Diagnosis not present

## 2022-11-05 DIAGNOSIS — H353221 Exudative age-related macular degeneration, left eye, with active choroidal neovascularization: Secondary | ICD-10-CM | POA: Diagnosis not present

## 2022-11-05 DIAGNOSIS — H353132 Nonexudative age-related macular degeneration, bilateral, intermediate dry stage: Secondary | ICD-10-CM | POA: Diagnosis not present

## 2022-11-06 ENCOUNTER — Ambulatory Visit (HOSPITAL_COMMUNITY)
Admission: RE | Admit: 2022-11-06 | Discharge: 2022-11-06 | Disposition: A | Discharge status: Home / Self Care | Payer: Medicare Other | Source: Ambulatory Visit | Attending: Internal Medicine | Admitting: Internal Medicine

## 2022-11-06 DIAGNOSIS — I5032 Chronic diastolic (congestive) heart failure: Secondary | ICD-10-CM | POA: Diagnosis not present

## 2022-11-06 LAB — BASIC METABOLIC PANEL
Anion gap: 10 (ref 5–15)
BUN: 24 mg/dL — ABNORMAL HIGH (ref 8–23)
CO2: 27 mmol/L (ref 22–32)
Calcium: 9.2 mg/dL (ref 8.9–10.3)
Chloride: 95 mmol/L — ABNORMAL LOW (ref 98–111)
Creatinine, Ser: 1.14 mg/dL — ABNORMAL HIGH (ref 0.44–1.00)
GFR, Estimated: 49 mL/min — ABNORMAL LOW (ref >60)
Glucose, Bld: 130 mg/dL — ABNORMAL HIGH (ref 70–99)
Potassium: 4.3 mmol/L (ref 3.5–5.1)
Sodium: 132 mmol/L — ABNORMAL LOW (ref 135–145)

## 2022-11-11 ENCOUNTER — Other Ambulatory Visit (HOSPITAL_COMMUNITY): Payer: Self-pay | Admitting: Family Medicine

## 2022-11-11 ENCOUNTER — Ambulatory Visit: Payer: Medicare Other | Admitting: Physical Therapy

## 2022-11-11 ENCOUNTER — Telehealth: Payer: Self-pay | Admitting: Hematology and Oncology

## 2022-11-11 NOTE — Telephone Encounter (Signed)
11/11/22; Due to a change in the provider schedule, I called the patient and left a voice mail with details of her rescheduled appointments. I mailed a reminder notice with the new appointment date and time.

## 2022-11-12 ENCOUNTER — Other Ambulatory Visit: Payer: Self-pay | Admitting: Nurse Practitioner

## 2022-11-12 DIAGNOSIS — D376 Neoplasm of uncertain behavior of liver, gallbladder and bile ducts: Secondary | ICD-10-CM

## 2022-11-12 NOTE — Therapy (Signed)
OUTPATIENT PHYSICAL THERAPY NEURO EVALUATION   Patient Name: [name] [name] MRN: [id number] DOB:06-Oct-1940, 82 y.o., female Today's Date: 11/12/2022   PCP: Rodrigo Ran, MD  REFERRING PROVIDER: Tat, Octaviano Batty, DO  END OF SESSION:   Past Medical History:  Diagnosis Date   Anemia    Arthritis    thumbs   Atrial tachycardia    Atypical atrial flutter (HCC)    Cataract    bilateral   Chronic anticoagulation    on Xarelto   Chronic diastolic CHF (congestive heart failure) (HCC)    Echocardiogram (11/24/12): Mild LVH, EF 60%.   Dysrhythmia    GERD (gastroesophageal reflux disease)    hx of no meds   Heart murmur    slight   Hyperlipidemia    a. Hx of leg weakness while on statin. under control   Hypertension    Hypothyroidism    Leg fracture Dec 21,2011   Osteoporosis 07/2007   Parkinson's disease 2011   Persistent atrial fibrillation (HCC)    s/p atrial fib ablation 11-11-11.    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HCC)    Squamous carcinoma summer 2015   back of left thigh   Wrist fracture    right   Past Surgical History:  Procedure Laterality Date   ATRIAL FIBRILLATION ABLATION N/A 11/11/2011   Procedure: ATRIAL FIBRILLATION ABLATION;  Surgeon: Hillis Range, MD;  Location: [facility] CATH LAB;  Service: Cardiovascular;  Laterality: N/A;   BREAST BIOPSY  1998   BREAST SURGERY     benign cyst removed   CARDIAC ELECTROPHYSIOLOGY STUDY AND ABLATION  5/14, 7/14   convergent AF ablation at [facility] and subsequent atypical atrial flutter ablation by Dr Hurman Horn   CARDIOVASCULAR STRESS TEST  06-06-2008   EF 73%   CARDIOVERSION  09/09/2011   Procedure: CARDIOVERSION;  Surgeon: Cassell Clement, MD;  Location: [facility] ENDOSCOPY;  Service: Cardiovascular;  Laterality: N/A;  to be done by dr. Patty Sermons   CARDIOVERSION   02/18/2012   Procedure: CARDIOVERSION;  Surgeon: Vesta Mixer, MD;  Location: [facility] ENDOSCOPY;  Service: Cardiovascular;  Laterality: N/A;   CHOLECYSTECTOMY  1995   FEMUR FRACTURE SURGERY  2011   metal pin in place   FOREARM SURGERY Left 2010   "opened arm to drain it"   INSERTION OF MESH N/A 11/24/2013   Procedure: INSERTION OF MESH;  Surgeon: Karie Soda, MD;  Location: [facility] ORS;  Service: General;  Laterality: N/A;   IR THORACENTESIS ASP PLEURAL SPACE W/IMG GUIDE  02/25/2022   KNEE SURGERY Left 2001   Left unicompartmental knee     Dr. Charlann Boxer 11/18/16   PACEMAKER PLACEMENT  10/20/12   MDT Advisa DR implanted by Dr Hurman Horn at [facility]    PARTIAL KNEE ARTHROPLASTY Left 11/18/2016   Procedure: LEFT UNICOMPARTMENTAL KNEE Medially;  Surgeon: Durene Romans, MD;  Location: [facility] ORS;  Service: Orthopedics;  Laterality: Left;  90 mins   PPM GENERATOR CHANGEOUT N/A 07/17/2021   Procedure: PPM GENERATOR CHANGEOUT;  Surgeon: Regan Lemming, MD;  Location: [facility] INVASIVE CV LAB;  Service: Cardiovascular;  Laterality: N/A;   RIGHT HEART CATH N/A 06/13/2021   Procedure: RIGHT HEART CATH;  Surgeon: Laurey Morale, MD;  Location: [facility] INVASIVE CV LAB;  Service: Cardiovascular;  Laterality: N/A;   RIGHT HEART CATH N/A 03/06/2022  Procedure: RIGHT HEART CATH;  Surgeon: Laurey Morale, MD;  Location: [facility] INVASIVE CV LAB;  Service: Cardiovascular;  Laterality: N/A;   SKIN CANCER DESTRUCTION  summer 2015   TEE WITHOUT CARDIOVERSION  11/10/2011   Procedure: TRANSESOPHAGEAL ECHOCARDIOGRAM (TEE);  Surgeon: Vesta Mixer, MD;  Location: [facility] ENDOSCOPY;  Service: Cardiovascular;  Laterality: N/A;   TEE WITHOUT CARDIOVERSION N/A 04/23/2022   Procedure: TRANSESOPHAGEAL ECHOCARDIOGRAM (TEE);  Surgeon: Laurey Morale, MD;  Location: [facility] ENDOSCOPY;  Service: Cardiovascular;  Laterality: N/A;   TONSILLECTOMY  as child   US ECHOCARDIOGRAPHY  03-10-2008   Est EF 55-60%, Dr. 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 05/08/2021   Aortic atherosclerosis (HCC) 12/10/2020   Normocytic anemia 07/09/2018   Cirrhosis (HCC) 11/09/2017   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/18/2016   Bilateral impacted cerumen 02/21/2016   Presbycusis of both ears 02/21/2016   Atypical atrial flutter (HCC) 05/28/2015   Diastolic dysfunction 06/13/2014   Ventral incisional hernia - epigastric 07/21/2013   Long term (current) use of anticoagulants 07/21/2013   Chronic diastolic heart failure (HCC) 12/01/2012   Paralysis agitans (HCC) 10/26/2012   Pacemaker 10/20/2012   Sick sinus syndrome (HCC) 10/20/2012   GERD (gastroesophageal reflux disease) 06/09/2012   Hyperlipidemia 06/09/2012   Parkinson's disease (HCC) 06/09/2012   Osteoporosis 06/09/2012   Hypertension 10/12/2011   Chest pain 06/03/2011   Atrial fibrillation (HCC) 10/31/2010    ONSET DATE: ***  REFERRING DIAG: G20.B2 (ICD-10-CM) - Parkinson's disease with dyskinesia and fluctuating manifestations (HCC)  THERAPY DIAG:  No diagnosis found.  Rationale for Evaluation and Treatment: Rehabilitation  SUBJECTIVE:                                                                                                                                                                                             SUBJECTIVE STATEMENT: *** Pt accompanied by: {accompnied:27141}  PERTINENT HISTORY: Pacemaker, Anemia, atrial  tach, a-flutter, chronic anticoagulation, CHF, HLD, HTN, osteoporosis, PD, a-fib, femur fx surgery 2011, L partial knee replacement 2018, wrist surgery  PAIN:  Are you having  pain? {OPRCPAIN:27236}  PRECAUTIONS: Fall and ICD/Pacemaker  RED FLAGS: {PT Red Flags:29287}   WEIGHT BEARING RESTRICTIONS: No  FALLS: Has patient fallen in last 6 months? {fallsyesno:27318}  LIVING ENVIRONMENT: Lives with: {OPRC lives with:25569::"lives with their family"} Lives in: {Lives in:25570} Stairs: {opstairs:27293} Has following equipment at home: {Assistive devices:23999}  PLOF: {PLOF:24004}  PATIENT GOALS: ***  OBJECTIVE:  Note: Objective measures were completed at Evaluation unless otherwise noted.  DIAGNOSTIC FINDINGS: none recent  COGNITION: Overall cognitive status: {cognition:24006}   SENSATION: {sensation:27233}  COORDINATION: Alternating pronation/supination: *** Alternating toe tap: *** Finger to nose: *** Heel to shin: ***   MUSCLE TONE: {LE tone:25568}   POSTURE: {posture:25561}  LOWER EXTREMITY ROM:     Active  Right Eval Left Eval  Hip flexion    Hip extension    Hip abduction    Hip adduction    Hip internal rotation    Hip external rotation    Knee flexion    Knee extension    Ankle dorsiflexion    Ankle plantarflexion    Ankle inversion    Ankle eversion     (Blank rows = not tested)  LOWER EXTREMITY MMT:    MMT Right Eval Left Eval  Hip flexion    Hip extension    Hip abduction    Hip adduction    Hip internal rotation    Hip external rotation    Knee flexion    Knee extension    Ankle dorsiflexion    Ankle plantarflexion    Ankle inversion    Ankle eversion    (Blank rows = not tested)   TRANSFERS: Assistive device utilized: {Assistive devices:23999}  Sit to stand: {Levels of assistance:24026} Stand to sit: {Levels of assistance:24026} Chair to chair: {Levels of assistance:24026} Floor: {Levels of  assistance:24026}   GAIT: Gait pattern: {gait characteristics:25376} Distance walked: *** Assistive device utilized: {Assistive devices:23999} Level of assistance: {Levels of assistance:24026} Comments: ***  FUNCTIONAL TESTS:  {Functional tests:24029}    TODAY'S TREATMENT:                                                                                                                              DATE: ***    PATIENT EDUCATION: Education details: *** Person educated: {Person educated:25204} Education method: {Education Method:25205} Education comprehension: {Education Comprehension:25206}  HOME EXERCISE PROGRAM: ***  GOALS: Goals reviewed with patient? {yes/no:20286}  SHORT TERM GOALS: Target date: {follow up:25551}  Patient to be independent with initial HEP. Baseline: HEP initiated Goal status: {GOALSTATUS:25110}    LONG TERM GOALS: Target date: {follow up:25551}  Patient to be independent with advanced HEP. Baseline: Not yet initiated  Goal status: {GOALSTATUS:25110}  Patient to verbalize understanding of local Parkinson's Disease community resources including community fitness post D/C.   Baseline: Not yet initiated  Goal status: {GOALSTATUS:25110}  Patient to improve MiniBestTest score to atleast 17-21 to decrease risk of falls.  Baseline: *** Goal status: {GOALSTATUS:25110}  Patient to  demonstrate <10% increase between TUG and TUG cognitive/manual to improve ability to dual task in home/community.  Baseline: *** Goal status: {GOALSTATUS:25110}  Patient to score at least ***/56 on Berg in order to decrease risk of falls.   Baseline: *** Goal status: {GOALSTATUS:25110}  Patient to complete TUG in <14 sec with LRAD in order to decrease risk of falls.  Baseline: *** Goal status: {GOALSTATUS:25110}  Patient to demonstrate 5xSTS test in <15 sec in order to decrease risk of falls.   Baseline: *** Goal status: {GOALSTATUS:25110}  Patient to verbalize  understanding of fall prevention in home environment information. Baseline: Not yet initiated Goal status: {GOALSTATUS:25110}  Patient to verbalize tips to reduce freezing/festination with gait and turns. Baseline: Not yet initiated  Goal status: {GOALSTATUS:25110}   ASSESSMENT:  CLINICAL IMPRESSION:  Patient is an 82 y/o F presenting to OPPT with c/o *** for the past ***  Patient today presenting with ***.    Patient was educated on gentle *** HEP and reported understanding. Prior to current episode, patient was independent. Would benefit from skilled PT services *** x/week for *** weeks to address aforementioned impairments in order to optimize level of function.    OBJECTIVE IMPAIRMENTS: {opptimpairments:25111}.   ACTIVITY LIMITATIONS: {activitylimitations:27494}  PARTICIPATION LIMITATIONS: {participationrestrictions:25113}  PERSONAL FACTORS: {Personal factors:25162} are also affecting patient's functional outcome.   REHAB POTENTIAL: {rehabpotential:25112}  CLINICAL DECISION MAKING: {clinical decision making:25114}  EVALUATION COMPLEXITY: {Evaluation complexity:25115}  PLAN:  PT FREQUENCY: {rehab frequency:25116}  PT DURATION: {rehab duration:25117}  PLANNED INTERVENTIONS: {rehab planned interventions:25118::"Therapeutic exercises","Therapeutic activity","Neuromuscular re-education","Balance training","Gait training","Patient/Family education","Self Care","Joint mobilization"}  PLAN FOR NEXT SESSION: Tyrone Sage, PT 11/12/2022, 12:14 PM

## 2022-11-13 ENCOUNTER — Other Ambulatory Visit: Payer: Self-pay

## 2022-11-13 ENCOUNTER — Ambulatory Visit: Payer: Medicare Other | Attending: Neurology | Admitting: Physical Therapy

## 2022-11-13 ENCOUNTER — Encounter: Payer: Self-pay | Admitting: Physical Therapy

## 2022-11-13 DIAGNOSIS — G20B2 Parkinson's disease with dyskinesia, with fluctuations: Secondary | ICD-10-CM | POA: Insufficient documentation

## 2022-11-13 DIAGNOSIS — R2689 Other abnormalities of gait and mobility: Secondary | ICD-10-CM | POA: Diagnosis not present

## 2022-11-13 DIAGNOSIS — R2681 Unsteadiness on feet: Secondary | ICD-10-CM | POA: Insufficient documentation

## 2022-11-17 ENCOUNTER — Ambulatory Visit: Payer: Medicare Other

## 2022-11-17 DIAGNOSIS — H353231 Exudative age-related macular degeneration, bilateral, with active choroidal neovascularization: Secondary | ICD-10-CM | POA: Diagnosis not present

## 2022-11-17 DIAGNOSIS — R2689 Other abnormalities of gait and mobility: Secondary | ICD-10-CM

## 2022-11-17 DIAGNOSIS — R2681 Unsteadiness on feet: Secondary | ICD-10-CM | POA: Diagnosis not present

## 2022-11-17 DIAGNOSIS — H4052X3 Glaucoma secondary to other eye disorders, left eye, severe stage: Secondary | ICD-10-CM | POA: Diagnosis not present

## 2022-11-17 DIAGNOSIS — H353132 Nonexudative age-related macular degeneration, bilateral, intermediate dry stage: Secondary | ICD-10-CM | POA: Diagnosis not present

## 2022-11-17 DIAGNOSIS — H02886 Meibomian gland dysfunction of left eye, unspecified eyelid: Secondary | ICD-10-CM | POA: Diagnosis not present

## 2022-11-17 DIAGNOSIS — H02883 Meibomian gland dysfunction of right eye, unspecified eyelid: Secondary | ICD-10-CM | POA: Diagnosis not present

## 2022-11-17 DIAGNOSIS — G20B2 Parkinson's disease with dyskinesia, with fluctuations: Secondary | ICD-10-CM | POA: Diagnosis not present

## 2022-11-17 DIAGNOSIS — H43812 Vitreous degeneration, left eye: Secondary | ICD-10-CM | POA: Diagnosis not present

## 2022-11-17 DIAGNOSIS — H35721 Serous detachment of retinal pigment epithelium, right eye: Secondary | ICD-10-CM | POA: Diagnosis not present

## 2022-11-17 NOTE — Therapy (Signed)
OUTPATIENT PHYSICAL THERAPY NEURO TREATMENT   Patient Name: [name] [name] MRN: [id number] DOB:07/20/1940, 82 y.o., female Today's Date: 11/17/2022   PCP: Rodrigo Ran, MD  REFERRING PROVIDER: Tat, Octaviano Batty, DO  END OF SESSION:  PT End of Session - 11/17/22 1618     Visit Number 2    Number of Visits 13    Date for PT Re-Evaluation 12/25/22    Authorization Type Medicare    PT Start Time 1615    PT Stop Time 1700    PT Time Calculation (min) 45 min    Equipment Utilized During Treatment Gait belt    Activity Tolerance Patient tolerated treatment well    Behavior During Therapy WFL for tasks assessed/performed             Past Medical History:  Diagnosis Date   Anemia    Arthritis    thumbs   Atrial tachycardia (HCC)    Atypical atrial flutter (HCC)    Cataract    bilateral   Chronic anticoagulation    on Xarelto   Chronic diastolic CHF (congestive heart failure) (HCC)    Echocardiogram (11/24/12): Mild LVH, EF 60%.   Dysrhythmia    GERD (gastroesophageal reflux disease)    hx of no meds   Heart murmur    slight   Hyperlipidemia    a. Hx of leg weakness while on statin. under control   Hypertension    Hypothyroidism    Leg fracture Dec 21,2011   Osteoporosis 07/2007   Parkinson's disease (HCC) 2011   Persistent atrial fibrillation (HCC)    s/p atrial fib ablation 11-11-11.    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HCC)    Squamous carcinoma summer 2015   back of left thigh   Wrist fracture    right   Past Surgical History:  Procedure Laterality Date   ATRIAL FIBRILLATION ABLATION N/A 11/11/2011   Procedure: ATRIAL FIBRILLATION ABLATION;  Surgeon: Hillis Range, MD;  Location: [facility] CATH LAB;  Service: Cardiovascular;  Laterality: N/A;   BREAST BIOPSY  1998   BREAST SURGERY     benign  cyst removed   CARDIAC ELECTROPHYSIOLOGY STUDY AND ABLATION  5/14, 7/14   convergent AF ablation at [facility] and subsequent atypical atrial flutter ablation by Dr Hurman Horn   CARDIOVASCULAR STRESS TEST  06-06-2008   EF 73%   CARDIOVERSION  09/09/2011   Procedure: CARDIOVERSION;  Surgeon: Cassell Clement, MD;  Location: [facility] ENDOSCOPY;  Service: Cardiovascular;  Laterality: N/A;  to be done by dr. Patty Sermons   CARDIOVERSION  02/18/2012   Procedure: CARDIOVERSION;  Surgeon: Vesta Mixer, MD;  Location: [facility] ENDOSCOPY;  Service: Cardiovascular;  Laterality: N/A;   CHOLECYSTECTOMY  1995   FEMUR FRACTURE SURGERY  2011   metal pin in place   FOREARM SURGERY Left 2010   "opened arm to drain it"   INSERTION OF MESH N/A 11/24/2013   Procedure: INSERTION OF MESH;  Surgeon: Karie Soda, MD;  Location: [facility] ORS;  Service: General;  Laterality: N/A;   IR THORACENTESIS ASP PLEURAL SPACE W/IMG GUIDE  02/25/2022   KNEE SURGERY Left 2001   Left unicompartmental knee     Dr. Charlann Boxer 11/18/16   PACEMAKER PLACEMENT  10/20/12   MDT Advisa DR implanted by Dr Hurman Horn at [facility]    PARTIAL  KNEE ARTHROPLASTY Left 11/18/2016   Procedure: LEFT UNICOMPARTMENTAL KNEE Medially;  Surgeon: Durene Romans, MD;  Location: [facility] ORS;  Service: Orthopedics;  Laterality: Left;  90 mins   PPM GENERATOR CHANGEOUT N/A 07/17/2021   Procedure: PPM GENERATOR CHANGEOUT;  Surgeon: Regan Lemming, MD;  Location: [facility] INVASIVE CV LAB;  Service: Cardiovascular;  Laterality: N/A;   RIGHT HEART CATH N/A 06/13/2021   Procedure: RIGHT HEART CATH;  Surgeon: Laurey Morale, MD;  Location: [facility] INVASIVE CV LAB;  Service: Cardiovascular;  Laterality: N/A;   RIGHT HEART CATH N/A 03/06/2022   Procedure: RIGHT HEART CATH;  Surgeon: Laurey Morale, MD;  Location: [facility] INVASIVE CV LAB;  Service: Cardiovascular;  Laterality: N/A;   SKIN CANCER DESTRUCTION  summer 2015   TEE WITHOUT CARDIOVERSION  11/10/2011   Procedure: TRANSESOPHAGEAL ECHOCARDIOGRAM (TEE);  Surgeon:  Vesta Mixer, MD;  Location: [facility] ENDOSCOPY;  Service: Cardiovascular;  Laterality: N/A;   TEE WITHOUT CARDIOVERSION N/A 04/23/2022   Procedure: TRANSESOPHAGEAL ECHOCARDIOGRAM (TEE);  Surgeon: Laurey Morale, MD;  Location: [facility] ENDOSCOPY;  Service: Cardiovascular;  Laterality: N/A;   TONSILLECTOMY  as child   US ECHOCARDIOGRAPHY  03-10-2008   Est EF 55-60%, Dr. 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 05/08/2021   Aortic atherosclerosis (HCC) 12/10/2020   Normocytic anemia 07/09/2018   Cirrhosis (HCC) 11/09/2017   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/18/2016   Bilateral impacted cerumen 02/21/2016   Presbycusis of both ears 02/21/2016   Atypical atrial flutter (HCC) 05/28/2015   Diastolic dysfunction 06/13/2014   Ventral incisional hernia - epigastric 07/21/2013   Long term (current) use of anticoagulants 07/21/2013   Chronic diastolic heart failure (HCC) 12/01/2012   Paralysis agitans (HCC) 10/26/2012   Pacemaker 10/20/2012   Sick sinus syndrome (HCC) 10/20/2012   GERD (gastroesophageal reflux disease) 06/09/2012   Hyperlipidemia 06/09/2012   Parkinson's disease (HCC) 06/09/2012   Osteoporosis 06/09/2012   Hypertension 10/12/2011   Chest pain 06/03/2011   Atrial fibrillation (HCC) 10/31/2010    ONSET DATE: 10+ years   REFERRING DIAG: G20.B2 (ICD-10-CM) - Parkinson's disease with dyskinesia and fluctuating  manifestations (HCC)  THERAPY DIAG:  Unsteadiness on feet  Other abnormalities of gait and mobility  Rationale for Evaluation and Treatment: Rehabilitation  SUBJECTIVE:  SUBJECTIVE STATEMENT: Doing ok, no new complaints.    Pt accompanied by: self  PERTINENT HISTORY: Pacemaker, Anemia, atrial tach, a-flutter, chronic anticoagulation, CHF, HLD, HTN, osteoporosis, PD, a-fib, femur fx surgery 2011, L partial knee replacement 2018, wrist surgery  PAIN:  Are you having pain? No  PRECAUTIONS: Fall and ICD/Pacemaker  RED FLAGS: None   WEIGHT BEARING RESTRICTIONS: No  FALLS: Has patient fallen in last 6 months? Yes. Number of falls 3; reports 1 fall resulted in head trauma but "I had sunglasses on so it only hit the sunglasses."  LIVING ENVIRONMENT: Lives with: lives with their spouse Lives in: Other independent living at [place], getting ready to move in another home within [employer] Stairs: No Has following equipment at home: Single point cane and Shower bench  PLOF: Independent; likes reading, walking, and working puzzles  PATIENT GOALS: work on balance   OBJECTIVE:   TODAY'S TREATMENT: 11/17/22 Activity Comments  Mini-BESTest 13/28  HEP review 0% carryover  Corner balance Feet together eyes closed 2x30 sec  Pt education Re: HIIT for PD, examples of stationary bike intervals--verbalizes understanding         PATIENT EDUCATION: Education details: reinforced HEP performance and additions, HIIT education Person educated: Patient Education method: Explanation, Demonstration, Tactile cues, Verbal cues, and Handouts Education comprehension: verbalized understanding and returned demonstration  HOME EXERCISE PROGRAM: Access Code: [email or web address] URL:  [email or web address] Date: 11/13/2022 Prepared by: [facility] - Outpatient  Rehab - [facility]  Exercises - Gastroc Stretch on Wall  - 1 x daily - 5 x weekly - 2 sets - 30 sec hold - Sit to Stand Without Arm Support  - 1 x daily - 5 x weekly - 2 sets - 10 reps - Standing with Head Rotation  - 1 x daily - 5 x weekly - 2 sets - 30 sec hold - Romberg Stance with Head Nods  - 1 x daily - 5 x weekly - 2 sets - 30 sec hold - Standing Toe Taps  - 1 x daily - 5 x weekly - 2 sets - 10 reps - Corner Balance Feet Together With Eyes Closed  - 1 x daily - 7 x weekly - 3 sets - 30 sec hold Note: Objective measures were completed at Evaluation unless otherwise noted.  DIAGNOSTIC FINDINGS: none recent  COGNITION: Overall cognitive status: Within functional limits for tasks assessed   SENSATION: Reports baseline N/T in the L lower leg  COORDINATION: Alternating pronation/supination: WNL B Alternating toe tap: WNL B Finger to nose: WNL B   MUSCLE TONE: WNL B LEs   POSTURE: rounded shoulders, forward head, and head flexed slightly L; dyskinesias at rest  LOWER EXTREMITY ROM:     Active  Right Eval Left Eval  Hip flexion    Hip extension    Hip abduction    Hip adduction    Hip internal rotation    Hip external rotation    Knee flexion    Knee extension    Ankle dorsiflexion 5 18  Ankle plantarflexion    Ankle inversion    Ankle eversion     (Blank rows = not tested)  LOWER EXTREMITY MMT:    MMT (in sitting) Right Eval Left Eval  Hip flexion 5 5  Hip extension    Hip abduction 4+ 4+  Hip adduction 4+ 4+  Hip internal rotation    Hip external rotation    Knee flexion 4+ 5  Knee extension 4+ 5  Ankle dorsiflexion 4+  4+  Ankle plantarflexion 4+ 4+  Ankle inversion    Ankle eversion    (Blank rows = not tested)     GAIT: Gait pattern: limited heel-toe pattern, R LE scissoring occasionally; tendency to perform several quick steps ; occasional  freezing  Assistive device utilized: None Level of assistance: SBA   FUNCTIONAL TESTS:    Completed 360 degree turn in 10 steps        GOALS: Goals reviewed with patient? Yes  SHORT TERM GOALS: Target date: 12/04/2022  Patient to be independent with initial HEP. Baseline: HEP initiated Goal status: INITIAL    LONG TERM GOALS: Target date: 12/25/2022  Patient to be independent with advanced HEP. Baseline: Not yet initiated  Goal status: INITIAL  Patient to verbalize understanding of local Parkinson's Disease community resources including community fitness post D/C.   Baseline: Not yet initiated  Goal status: INITIAL  Patient to improve MiniBestTest score to atleast 17-21 to decrease risk of falls.  Baseline: 13/28 Goal status: IN PROGRESS  Patient to complete 360 degree turn in 8 steps or less.  Baseline: 10 steps Goal status: INITIAL  Patient to demonstrate 5xSTS test in <15 sec without posterior LOB in order to decrease risk of falls.   Baseline: 9.2 sec with posterior lean/LOB Goal status: INITIAL  Patient to verbalize tips to reduce freezing/festination with gait and turns. Baseline: Not yet initiated  Goal status: INITIAL   ASSESSMENT:  CLINICAL IMPRESSION: Mini-BESTest performed with score of 13/28 indicating high risk for falls with deficits during push-release, postural stability under multi-sensory conditions, and single limb support most notably.  Review of HEP with limited carryover requiring 100% cues/demonstration for performance and execution.  Addition of corner balance, eyes closed to improve proprioception and postural stability. Pt education regarding exercise format and rationale of intervention for those with PD, verbalizes understanding. Continued sessions to advance POC details   OBJECTIVE IMPAIRMENTS: Abnormal gait, decreased balance, decreased ROM, decreased safety awareness, increased muscle spasms, impaired flexibility, and postural  dysfunction.   ACTIVITY LIMITATIONS: carrying, lifting, bending, standing, squatting, stairs, transfers, bathing, toileting, dressing, reach over head, hygiene/grooming, and locomotion level  PARTICIPATION LIMITATIONS: meal prep, cleaning, laundry, driving, shopping, community activity, and church  PERSONAL FACTORS: Age, Past/current experiences, Time since onset of injury/illness/exacerbation, and 3+ comorbidities: Pacemaker, Anemia, atrial tach, a-flutter, chronic anticoagulation, CHF, HLD, HTN, osteoporosis, PD, a-fib, femur fx surgery 2011, L partial knee replacement 2018, wrist surgery  are also affecting patient's functional outcome.   REHAB POTENTIAL: Good  CLINICAL DECISION MAKING: Evolving/moderate complexity  EVALUATION COMPLEXITY: Moderate  PLAN:  PT FREQUENCY: 1-2x/week  PT DURATION: 6 weeks  PLANNED INTERVENTIONS: 97146- PT Re-evaluation, 97110-Therapeutic exercises, 97530- Therapeutic activity, 97112- Neuromuscular re-education, 97535- Self Care, 32440- Manual therapy, [phone]- Gait training, [phone]- Canalith repositioning, [id number]- Aquatic Therapy, 97014- Electrical stimulation (unattended), Patient/Family education, Balance training, Stair training, Taping, Dry Needling, Joint mobilization, Vestibular training, Cryotherapy, Moist heat, Therapeutic exercises, Therapeutic activity, Neuromuscular re-education, Gait training, and Self Care  PLAN FOR NEXT SESSION: review HEP, edu on PD community resources and freezing episode tips, push-release training   5:06 PM, 11/17/22 M. Shary Decamp, PT, DPT Physical Therapist- [place] Office Number: [phone]

## 2022-11-18 ENCOUNTER — Ambulatory Visit
Admission: RE | Admit: 2022-11-18 | Discharge: 2022-11-18 | Disposition: A | Discharge status: Home / Self Care | Payer: Medicare Other | Source: Ambulatory Visit | Attending: Nurse Practitioner

## 2022-11-18 ENCOUNTER — Encounter: Payer: Self-pay | Admitting: Cardiology

## 2022-11-18 ENCOUNTER — Encounter: Payer: Self-pay | Admitting: Neurology

## 2022-11-18 ENCOUNTER — Telehealth: Payer: Self-pay | Admitting: Neurology

## 2022-11-18 DIAGNOSIS — K7460 Unspecified cirrhosis of liver: Secondary | ICD-10-CM | POA: Diagnosis not present

## 2022-11-18 DIAGNOSIS — D376 Neoplasm of uncertain behavior of liver, gallbladder and bile ducts: Secondary | ICD-10-CM

## 2022-11-18 DIAGNOSIS — K7689 Other specified diseases of liver: Secondary | ICD-10-CM | POA: Diagnosis not present

## 2022-11-18 MED ORDER — IOPAMIDOL (ISOVUE-300) INJECTION 61%
100.0000 mL | Freq: Once | INTRAVENOUS | Status: AC | PRN
  Administered 2022-11-18: 100 mL via INTRAVENOUS

## 2022-11-18 NOTE — Telephone Encounter (Signed)
Caller stated she is confused on how to take her Carbidopa-Levodopa ER (RYTARY) medication that was given on 10/28/22. There are 2 different directions on how to take medication on patients paperwork.

## 2022-11-18 NOTE — Telephone Encounter (Signed)
Called patient and informed her that she should be taking   Rytary 145 mg, 2 po q hs.    Patient verbalized understanding and had no further questions or concerns.

## 2022-11-24 ENCOUNTER — Ambulatory Visit: Payer: Medicare Other | Admitting: Physical Therapy

## 2022-11-25 NOTE — Progress Notes (Signed)
PCP: Rodrigo Ran, MD EP: Dr. Elberta Fortis HF Cardiology: Dr. Shirlee Latch  82 y.o. 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  Patient had an echo done in 3/23 showing EF 65-70%, mild LVH, normal RV, PASP 60 mmHg, moderate biatrial enlargement, mild MR, moderate TR. Prior echoes had shown mildly elevated PA pressure. Cardiolite in 10/21 showed no ischemia.   RHC was done in 5/23 showing mild pulmonary venous hypertension with mildly elevated PCWP. She was started on Farxiga.  She developed a persistent UTI that was difficult to clear and stopped [place].   Acute follow up 02/20/22 for dyspnea. CTA chest showed no PE, moderate right pleural effusion, mild pulmonary edema. NYHA III symptoms and volume overloaded, she was given IV lasix 80 mg x 1 then instructed to use [name] daily x 2 days, then switch from Lasix to torsemide 60 mg daily. RHC arranged which showed elevated R/L heart filling pressures, prominent V waves in PCWP tracing, preserved CO and moderate pulmonary venous hypertension. Echo arranged to assess MR and torsemide increased to 60/40.  S/p R therapeutic thoracentesis 02/25/22 yielding 350 ml fluid.  Echo 2/24 showed EF 55-60%, normal RV, mild MR, moderate-severe TR with dilated IVC.  TEE in 3/24 showed EF 55-60%, mild RV enlargement with mildly decreased RV systolic function, mild MR, severe TR (PPM lead not fused to leaflets), small PFO.   Follow up with EP 06/23/22, device interrogation showing 8% burden of AT/AF. Planning to re-evaluate after possible TR intervention.  Saw Dr. Art Buff @ [facility] in 5/24 and had workup for percutaneous tricuspid interventions (Triclip, Evoque valve).  She was determined not to be a candidate for any of the available tricuspid interventions.    Today she returns for HF follow up  with her husband. Overall feeling fatigued. She feels light-headed, had a fall last week and fell backward while walking up steps. This was a mechanical fall as her cane got caught on steps. Denies palpitations, CP,  edema, or PND/Orthopnea. Appetite ok. No fever or chills. Weight at home 126 pounds. Taking all medications. Feels bloated. Husband says [name] [name] has had a significant functional decline over past month or two, asking about re-consideration of TriClip, Evoque valve at [facility].  ECG (personally reviewed): A-V dual paced  ReDs 34%  Medtronic device interrogation (personally reviewed): Rare short AF runs, 94% v-pacing, 4.6 hr/day activity  Labs (3/23): hgb 9.7 Labs (4/23): K 3.2, creatinine 0.92 Labs (6/23): creatinine 0.8  Labs (11/23): K 3.5, creatinine 0.7 Labs (1/24): K 4.1, creatinine 0.85 Labs (2/24): K 3.7, creatinine 0.95 Labs (3/24): K 3.3, creatinine 0.93, hgb 10.8 Labs (4/24): K 4.0, creatinine 1.10 Labs (6/24): BNP 78 Labs (9/24): K 4.6, creatinine 1.02, LFTs normal  PMH: 1. Atrial fibrillation: Paroxysmal.  S/p AF (epicardial and endocardial) ablation in 10/13.  2. Tachy-brady syndrome: Medtronic PPM placed in 9/14.  3. H/o atypical atrial flutter 4. Hyperlipidemia 5. HTN 6. Hypothyroidism 7. Parkinsons disease: Followed by Dr. Arbutus Leas 8. Cirrhosis: ?NAFLD. No ETOH.  9. Chronic diastolic CHF/pulmonary hypertension:  - LHC (2014): No significant CAD.  - Cardiolite (10/21): EF 71%, no ischemia.  - Echo (3/23): EF 65-70%, mild LVH, normal RV, PASP 60 mmHg, moderate biatrial enlargement, mild MR, moderate TR.  - RHC (5/23): mean RA 8, PA 49/13 mean 30, mean PCWP 19 with v-waves to 30, CI 3.96, PVR 1.8 WU.  -  RHC (2/24): RA mean 12, PA 53/24 (mean 39) PCWP mean 23 with prominent v-waves to 35, CO/CI (Fick) 6.75/4.23, PVR 2.4 WU - Echo (2/24): EF 55-60%, normal RV, mild MR, moderate-severe TR with dilated IVC. - TEE (3/24): EF 55-60%, mild RV enlargement with mildly  decreased RV systolic function, mild MR, severe TR (PPM lead not fused to leaflets), small PFO.  10. B12 deficiency.  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  - Seen at [facility] in [place], not candidate for percutaneous tricuspid repair.   Social History   Socioeconomic History   Marital status: Married    Spouse name: Not on file   Number of children: 2   Years of education: Not on file   Highest education level: Master's degree (e.g., MA, MS, MEng, MEd, MSW, MBA)  Occupational History   Not on file  Tobacco Use   Smoking status: Never   Smokeless tobacco: Never  Vaping Use   Vaping status: Never Used  Substance and Sexual Activity   Alcohol use: No   Drug use: No   Sexual activity: Yes    Partners: Male    Birth control/protection: Post-menopausal  Other Topics Concern   Not on file  Social History Narrative   Lives in [place].  Retired Comptroller.   Right handed    Social Determinants of Health   Financial Resource Strain: Not on file  Food Insecurity: Low Risk  (03/11/2022)   [facility]   Hunger Vital Sign    Worried About Running Out of Food in the Last Year: Never true    Ran Out of Food in the Last Year: Never true  Transportation Needs: Not on file (03/11/2022)  Physical Activity: Not on file  Stress: Not on file  Social Connections: Not on file  Intimate Partner Violence: Not on file   Family History  Problem Relation Age of Onset   Alzheimer's disease Mother    Heart failure Father    Healthy Child    Healthy Child    Breast cancer Maternal Aunt    ROS: All systems reviewed and negative except as per HPI.   Current Outpatient Medications  Medication Sig Dispense Refill   acetaminophen (TYLENOL) 500 MG tablet Take 1,000 mg by mouth at bedtime as needed for mild pain, moderate pain or  headache.     Acetylcysteine (NAC 600) 600 MG CAPS Take 600 mg by mouth in the morning.     B-D 3CC LUER-LOK SYR 25GX1" 25G X 1" 3 ML MISC Inject 1 mL into the muscle once a week.     BELSOMRA 20 MG TABS Take 20 mg by mouth at bedtime as needed (sleep).     Calcium Citrate-Vitamin D (CITRACAL + D PO) Take 1 tablet by mouth every evening.     carbidopa-levodopa (SINEMET IR) 25-100 MG tablet TAKE 2 TABLETS BY MOUTH 3 TIMES DAILY. 540 tablet 0   Carbidopa-Levodopa ER 23.75-95 MG CPCR Take by mouth.     cholecalciferol (VITAMIN D3) 25 MCG (1000 UNIT) tablet Take 1,000 Units by mouth in the morning.     cyanocobalamin (,VITAMIN B-12,) 1000 MCG/ML injection INJECT INTRAMUSCULARLY IN THE DELTOID ONCE A MONTH (ALTERNATING DELT. EACH MONTH)     Cyanocobalamin (VITAMIN B12) 1000 MCG TBCR Take 1,000 mcg by mouth in the morning.  docusate sodium (COLACE) 100 MG capsule Take 100 mg by mouth 2 (two) times daily.     ELIQUIS 5 MG TABS tablet TAKE ONE TABLET BY MOUTH AT BREAKFAST AND AT BEDTIME 180 tablet 1   GLUTATHIONE PO Take 2 capsules by mouth in the morning.     levothyroxine (SYNTHROID) 50 MCG tablet Take 50-100 mcg by mouth See admin instructions. Take 2 tablets (100 mcg) by mouth on Mondays and Thursdays in the morning. Take 1 tablet (50 mcg) by mouth on Sundays, Tuesdays, Wednesdays, Fridays & Saturdays.     magnesium oxide (MAG-OX) 400 MG tablet Take 400 mg by mouth at bedtime.     metoprolol succinate (TOPROL-XL) 25 MG 24 hr tablet TAKE ONE TABLET BY MOUTH EVERYDAY AT BEDTIME 90 tablet 2   multivitamin-lutein (OCUVITE-LUTEIN) CAPS capsule Take 1 capsule by mouth in the morning.     NON FORMULARY Take 1 tablet by mouth at bedtime. MetaCalm Stress Regulation Formula     polycarbophil (FIBERCON) 625 MG tablet Take 1,250 mg by mouth in the morning.     potassium chloride (MICRO-K) 10 MEQ CR capsule TAKE 6 CAPSULES EVERY MORNING AND AT NOON 360 capsule 0   Probiotic Product (PROBIOTIC PO) Take 1  capsule by mouth every evening.     rasagiline (AZILECT) 1 MG TABS tablet Take 1 tablet (1 mg total) by mouth daily. 90 tablet 0   spironolactone (ALDACTONE) 25 MG tablet Take 2 tablets (50 mg total) by mouth daily. 180 tablet 3   torsemide (DEMADEX) 20 MG tablet Take 4 tablets (80 mg total) by mouth every morning AND 3 tablets (60 mg total) every evening. 300 tablet 3   valACYclovir (VALTREX) 1000 MG tablet Take 1,000 mg by mouth 2 (two) times daily as needed (fever blisters).     vitamin C (ASCORBIC ACID) 500 MG tablet Take 500 mg by mouth in the morning.     No current facility-administered medications for this encounter.   BP 122/76   Pulse 70   Wt 58.3 kg (128 lb 9.6 oz)   LMP 02/04/1996   SpO2 100%   BMI 24.30 kg/m   Wt Readings from Last 3 Encounters:  11/26/22 58.3 kg (128 lb 9.6 oz)  10/28/22 58.7 kg (129 lb 6.4 oz)  10/27/22 59.3 kg (130 lb 12.8 oz)   Physical Exam General:  NAD. No resp difficulty, walked into clinic with cane HEENT: Normal Neck: Supple. No JVD. Carotids 2+ bilat; no bruits. No lymphadenopathy or thryomegaly appreciated. Cor: PMI nondisplaced. Regular rate & rhythm. No rubs, gallops or murmurs. Lungs: Clear Abdomen: Soft, nontender, nondistended. No hepatosplenomegaly. No bruits or masses. Good bowel sounds. Extremities: No cyanosis, clubbing, rash, edema Neuro: Alert & oriented x 3, cranial nerves grossly intact. Moves all 4 extremities w/o difficulty. Affect pleasant. + BLE dyskinesia  Assessment/Plan: 1. Pulmonary hypertension:  RHC (1/24) showed moderate pulmonary venous hypertension with elevated PCWP (group 2 PH). 2. Chronic diastolic CHF: Echo in 3/23 with EF 65-70%, mild LVH, normal RV, PASP 60 mmHg, moderate biatrial enlargement, mild MR, moderate TR. RHC in 5/23 showed elevated PCWP with prominent v-waves.  Echo from 3/23 reviewed, no more than mild MR so suspect elevated v-waves due to diastolic dysfunction.  She had pulmonary venous  hypertension. She underwent RHC again in 1/24 showing elevated R/L heart filling pressures, prominent V-waves in PCWP tracing, preserved CO and moderate pulmonary venous hypertension. Echo 2/24 showed stable EF 55-60%, normal RV, mild MR, moderate-severe TR with dilated IVC.  TEE in 3/24 showed EF 55-60%, mild RV enlargement with mildly decreased RV systolic function, mild MR, severe TR (PPM lead not fused to leaflets), small PFO. NYHA class III symptoms, gradually worse.  She is not volume overloaded on exam, weight is down and ReDs 34%.  - Continue torsemide 80 qam/60 qpm. BMET today. - Continue spironolactone 50 mg daily.   - Off Farxiga due to persistent UTI.  3. Tricuspid regurgitation: Echo 2/24 showed moderate to severe TR. TEE was done in 3/24, showing severe TR with ERO 0.43 cm^2 by PISA, regurgitant volume 50 cc, PPM does moves independently from leaflets and does not appear to be fused, there is mild RV dilation/mild RV dysfunction.  I suspect TR plays a significant role in her CHF.  She had evaluation at [facility] and was determined not to be a suitable candidate for tricuspid valve percutaneous repair (Triclip or Evoque).  - Husband concerned with her functional decline, asking about tricuspid intervention again. He was under impression that this may be a candidate for tricuspid intervention if PPM lead could be moved. I confirmed with Dr. Shirlee Latch, that per Dr. Sheila Oats notes, she was not a candidate. We discussed today that they could call [facility] back and ask for reconsideration. - Continue medical management.  4. Atrial fibrillation: Paroxysmal. She has had an epicardial and endocardial ablation in the past.  She failed Tikosyn in the past and did not tolerate amiodarone due to side effects.  She is in NSR today, device shows occasional short AF.  - Continue Eliquis. No bleeding issues. CBC today. - Continue Toprol XL.  - Follow with Dr. Elberta Fortis, would consider AF ablation.  5.  Tachy-brady syndrome: Has MDT PPM. Had gen change 07/17/21 6. Cirrhosis: ?NAFLD.  Does not drink any ETOH now, was not a heavy drinker before.  - Followed by [facility].  7. Anemia, chronic. She is followed by Dr. Pamelia Hoit. She is due for follow up, check iron panel. ? If functional decline related to worsening anemia.  Follow up in 3-4 months with Dr. Kathreen [facility] FNP-BC 11/26/2022

## 2022-11-26 ENCOUNTER — Other Ambulatory Visit (HOSPITAL_COMMUNITY): Payer: Self-pay | Admitting: *Deleted

## 2022-11-26 ENCOUNTER — Telehealth (HOSPITAL_COMMUNITY): Payer: Self-pay | Admitting: *Deleted

## 2022-11-26 ENCOUNTER — Encounter: Payer: Self-pay | Admitting: Physical Therapy

## 2022-11-26 ENCOUNTER — Ambulatory Visit (HOSPITAL_COMMUNITY)
Admission: RE | Admit: 2022-11-26 | Discharge: 2022-11-26 | Disposition: A | Discharge status: Home / Self Care | Payer: Medicare Other | Source: Ambulatory Visit | Attending: Family Medicine | Admitting: Family Medicine

## 2022-11-26 ENCOUNTER — Inpatient Hospital Stay: Payer: Medicare Other

## 2022-11-26 ENCOUNTER — Ambulatory Visit: Payer: Medicare Other | Admitting: Physical Therapy

## 2022-11-26 ENCOUNTER — Encounter (HOSPITAL_COMMUNITY): Payer: Self-pay

## 2022-11-26 ENCOUNTER — Inpatient Hospital Stay: Payer: Medicare Other | Admitting: Hematology and Oncology

## 2022-11-26 VITALS — BP 122/76 | HR 70 | Wt 128.6 lb

## 2022-11-26 DIAGNOSIS — I5032 Chronic diastolic (congestive) heart failure: Secondary | ICD-10-CM | POA: Insufficient documentation

## 2022-11-26 DIAGNOSIS — R2689 Other abnormalities of gait and mobility: Secondary | ICD-10-CM

## 2022-11-26 DIAGNOSIS — I495 Sick sinus syndrome: Secondary | ICD-10-CM | POA: Insufficient documentation

## 2022-11-26 DIAGNOSIS — K7469 Other cirrhosis of liver: Secondary | ICD-10-CM

## 2022-11-26 DIAGNOSIS — D649 Anemia, unspecified: Secondary | ICD-10-CM

## 2022-11-26 DIAGNOSIS — Z7901 Long term (current) use of anticoagulants: Secondary | ICD-10-CM | POA: Diagnosis not present

## 2022-11-26 DIAGNOSIS — G20A1 Parkinson's disease without dyskinesia, without mention of fluctuations: Secondary | ICD-10-CM | POA: Insufficient documentation

## 2022-11-26 DIAGNOSIS — R9431 Abnormal electrocardiogram [ECG] [EKG]: Secondary | ICD-10-CM | POA: Insufficient documentation

## 2022-11-26 DIAGNOSIS — I2722 Pulmonary hypertension due to left heart disease: Secondary | ICD-10-CM | POA: Insufficient documentation

## 2022-11-26 DIAGNOSIS — R2681 Unsteadiness on feet: Secondary | ICD-10-CM

## 2022-11-26 DIAGNOSIS — I272 Pulmonary hypertension, unspecified: Secondary | ICD-10-CM | POA: Diagnosis not present

## 2022-11-26 DIAGNOSIS — Z950 Presence of cardiac pacemaker: Secondary | ICD-10-CM | POA: Insufficient documentation

## 2022-11-26 DIAGNOSIS — G20B2 Parkinson's disease with dyskinesia, with fluctuations: Secondary | ICD-10-CM | POA: Diagnosis not present

## 2022-11-26 DIAGNOSIS — I361 Nonrheumatic tricuspid (valve) insufficiency: Secondary | ICD-10-CM | POA: Diagnosis not present

## 2022-11-26 DIAGNOSIS — I071 Rheumatic tricuspid insufficiency: Secondary | ICD-10-CM

## 2022-11-26 DIAGNOSIS — K7460 Unspecified cirrhosis of liver: Secondary | ICD-10-CM | POA: Diagnosis not present

## 2022-11-26 DIAGNOSIS — I480 Paroxysmal atrial fibrillation: Secondary | ICD-10-CM | POA: Diagnosis not present

## 2022-11-26 DIAGNOSIS — I4891 Unspecified atrial fibrillation: Secondary | ICD-10-CM | POA: Diagnosis not present

## 2022-11-26 LAB — FERRITIN: Ferritin: 13 ng/mL (ref 11–307)

## 2022-11-26 LAB — IRON AND TIBC
Iron: 33 ug/dL (ref 28–170)
Saturation Ratios: 7 % — ABNORMAL LOW (ref 10.4–31.8)
TIBC: 491 ug/dL — ABNORMAL HIGH (ref 250–450)
UIBC: 458 ug/dL

## 2022-11-26 LAB — RETIC PANEL
Immature Retic Fract: 16.5 % — ABNORMAL HIGH (ref 2.3–15.9)
RBC.: 3.82 MIL/uL — ABNORMAL LOW (ref 3.87–5.11)
Retic Count, Absolute: 61.9 10*3/uL (ref 19.0–186.0)
Retic Ct Pct: 1.6 % (ref 0.4–3.1)
Reticulocyte Hemoglobin: 22.8 pg — ABNORMAL LOW (ref >27.9)

## 2022-11-26 LAB — CBC
HCT: 29.9 % — ABNORMAL LOW (ref 36.0–46.0)
Hemoglobin: 8.9 g/dL — ABNORMAL LOW (ref 12.0–15.0)
MCH: 23.2 pg — ABNORMAL LOW (ref 26.0–34.0)
MCHC: 29.8 g/dL — ABNORMAL LOW (ref 30.0–36.0)
MCV: 77.9 fL — ABNORMAL LOW (ref 80.0–100.0)
Platelets: 268 10*3/uL (ref 150–400)
RBC: 3.84 MIL/uL — ABNORMAL LOW (ref 3.87–5.11)
RDW: 17.5 % — ABNORMAL HIGH (ref 11.5–15.5)
WBC: 6.6 10*3/uL (ref 4.0–10.5)
nRBC: 0 % (ref 0.0–0.2)

## 2022-11-26 LAB — BASIC METABOLIC PANEL
Anion gap: 7 (ref 5–15)
BUN: 21 mg/dL (ref 8–23)
CO2: 29 mmol/L (ref 22–32)
Calcium: 9.6 mg/dL (ref 8.9–10.3)
Chloride: 95 mmol/L — ABNORMAL LOW (ref 98–111)
Creatinine, Ser: 1.12 mg/dL — ABNORMAL HIGH (ref 0.44–1.00)
GFR, Estimated: 50 mL/min — ABNORMAL LOW (ref >60)
Glucose, Bld: 89 mg/dL (ref 70–99)
Potassium: 4.6 mmol/L (ref 3.5–5.1)
Sodium: 131 mmol/L — ABNORMAL LOW (ref 135–145)

## 2022-11-26 LAB — VITAMIN B12: Vitamin B-12: 1397 pg/mL — ABNORMAL HIGH (ref 180–914)

## 2022-11-26 NOTE — Therapy (Signed)
OUTPATIENT PHYSICAL THERAPY NEURO TREATMENT   Patient Name: [name] [name] MRN: [id number] DOB:[birth year], 82 y.o., female Today's Date: 11/26/2022   PCP: Rodrigo Ran, MD  REFERRING PROVIDER: Tat, Octaviano Batty, DO  END OF SESSION:  PT End of Session - 11/26/22 1152     Visit Number 3    Number of Visits 13    Date for PT Re-Evaluation 12/25/22    Authorization Type Medicare    PT Start Time 1154   Pt in restroom   PT Stop Time 1232    PT Time Calculation (min) 38 min    Equipment Utilized During Treatment --    Activity Tolerance Patient tolerated treatment well;Patient limited by fatigue    Behavior During Therapy WFL for tasks assessed/performed              Past Medical History:  Diagnosis Date   Anemia    Arthritis    thumbs   Atrial tachycardia (HCC)    Atypical atrial flutter (HCC)    Cataract    bilateral   Chronic anticoagulation    on Xarelto   Chronic diastolic CHF (congestive heart failure) (HCC)    Echocardiogram (11/24/12): Mild LVH, EF 60%.   Dysrhythmia    GERD (gastroesophageal reflux disease)    hx of no meds   Heart murmur    slight   Hyperlipidemia    a. Hx of leg weakness while on statin. under control   Hypertension    Hypothyroidism    Leg fracture Dec 21,2011   Osteoporosis 07/2007   Parkinson's disease (HCC) 2011   Persistent atrial fibrillation (HCC)    s/p atrial fib ablation 11-11-11.    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HCC)    Squamous carcinoma summer 2015   back of left thigh   Wrist fracture    right   Past Surgical History:  Procedure Laterality Date   ATRIAL FIBRILLATION ABLATION N/A 11/11/2011   Procedure: ATRIAL FIBRILLATION ABLATION;  Surgeon: Hillis Range, MD;  Location: [facility] CATH LAB;  Service: Cardiovascular;  Laterality: N/A;   BREAST  BIOPSY  1998   BREAST SURGERY     benign cyst removed   CARDIAC ELECTROPHYSIOLOGY STUDY AND ABLATION  5/14, 7/14   convergent AF ablation at [facility] and subsequent atypical atrial flutter ablation by Dr Hurman Horn   CARDIOVASCULAR STRESS TEST  06-06-2008   EF 73%   CARDIOVERSION  09/09/2011   Procedure: CARDIOVERSION;  Surgeon: Cassell Clement, MD;  Location[facility] ENDOSCOPY;  Service: Cardiovascular;  Laterality: N/A;  to be done by dr. Patty Sermons   CARDIOVERSION  02/18/2012   Procedure: CARDIOVERSION;  Surgeon: Vesta Mixer, MD;  Location: [facility] ENDOSCOPY;  Service: Cardiovascular;  Laterality: N/A;   CHOLECYSTECTOMY  1995   FEMUR FRACTURE SURGERY  2011   metal pin in place   FOREARM SURGERY Left 2010   "opened arm to drain it"   INSERTION OF MESH N/A 11/24/2013   Procedure: INSERTION OF MESH;  Surgeon: Karie Soda, MD;  Location: [facility] ORS;  Service: General;  Laterality: N/A;   IR THORACENTESIS ASP PLEURAL SPACE W/IMG GUIDE  02/25/2022   KNEE SURGERY Left 2001   Left unicompartmental knee     Dr. Charlann Boxer 11/18/16   PACEMAKER PLACEMENT  10/20/12   MDT Advisa DR implanted by Dr  Mounsey at [facility]    PARTIAL KNEE ARTHROPLASTY Left 11/18/2016   Procedure: LEFT UNICOMPARTMENTAL KNEE Medially;  Surgeon: Durene Romans, MD;  Location: [facility] ORS;  Service: Orthopedics;  Laterality: Left;  90 mins   PPM GENERATOR CHANGEOUT N/A 07/17/2021   Procedure: PPM GENERATOR CHANGEOUT;  Surgeon: Regan Lemming, MD;  Location: [facility] INVASIVE CV LAB;  Service: Cardiovascular;  Laterality: N/A;   RIGHT HEART CATH N/A 06/13/2021   Procedure: RIGHT HEART CATH;  Surgeon: Laurey Morale, MD;  Location: [facility] INVASIVE CV LAB;  Service: Cardiovascular;  Laterality: N/A;   RIGHT HEART CATH N/A 03/06/2022   Procedure: RIGHT HEART CATH;  Surgeon: Laurey Morale, MD;  Location: [facility] INVASIVE CV LAB;  Service: Cardiovascular;  Laterality: N/A;   SKIN CANCER DESTRUCTION  summer 2015   TEE WITHOUT CARDIOVERSION  11/10/2011   Procedure:  TRANSESOPHAGEAL ECHOCARDIOGRAM (TEE);  Surgeon: Vesta Mixer, MD;  Location: [facility] ENDOSCOPY;  Service: Cardiovascular;  Laterality: N/A;   TEE WITHOUT CARDIOVERSION N/A 04/23/2022   Procedure: TRANSESOPHAGEAL ECHOCARDIOGRAM (TEE);  Surgeon: Laurey Morale, MD;  Location: [facility] ENDOSCOPY;  Service: Cardiovascular;  Laterality: N/A;   TONSILLECTOMY  as child   US ECHOCARDIOGRAPHY  03-10-2008   Est EF 55-60%, Dr. 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 05/08/2021   Aortic atherosclerosis (HCC) 12/10/2020   Normocytic anemia 07/09/2018   Cirrhosis (HCC) 11/09/2017   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/18/2016   Bilateral impacted cerumen 02/21/2016   Presbycusis of both ears 02/21/2016   Atypical atrial flutter (HCC) 05/28/2015   Diastolic dysfunction 06/13/2014   Ventral incisional hernia - epigastric 07/21/2013   Long term (current) use of anticoagulants 07/21/2013   Chronic diastolic heart failure (HCC) 12/01/2012   Paralysis agitans (HCC) 10/26/2012   Pacemaker 10/20/2012   Sick sinus syndrome (HCC) 10/20/2012   GERD (gastroesophageal reflux disease) 06/09/2012   Hyperlipidemia 06/09/2012   Parkinson's disease (HCC) 06/09/2012   Osteoporosis 06/09/2012   Hypertension 10/12/2011   Chest pain 06/03/2011   Atrial fibrillation (HCC) 10/31/2010    ONSET DATE: 10+ years   REFERRING DIAG: G20.B2 (ICD-10-CM) - Parkinson's  disease with dyskinesia and fluctuating manifestations (HCC)  THERAPY DIAG:  Unsteadiness on feet  Other abnormalities of gait and mobility  Rationale for Evaluation and Treatment: Rehabilitation  SUBJECTIVE:  SUBJECTIVE STATEMENT: Want to work on my balance.  If I'm in a crowd, I feel like I may fall over.  I catch my balance, but it scares me.  I'm tired and my legs are very weak.  Pt accompanied by: self  PERTINENT HISTORY: Pacemaker, Anemia, atrial tach, a-flutter, chronic anticoagulation, CHF, HLD, HTN, osteoporosis, PD, a-fib, femur fx surgery 2011, L partial knee replacement 2018, wrist surgery  PAIN:  Are you having pain? No  PRECAUTIONS: Fall and ICD/Pacemaker  RED FLAGS: None   WEIGHT BEARING RESTRICTIONS: No  FALLS: Has patient fallen in last 6 months? Yes. Number of falls 3; reports 1 fall resulted in head trauma but "I had sunglasses on so it only hit the sunglasses."  LIVING ENVIRONMENT: Lives with: lives with their spouse Lives in: Other independent living at [place], getting ready to move in another home within [employer] Stairs: No Has following equipment at home: Single point cane and Shower bench  PLOF: Independent; likes reading, walking, and working puzzles  PATIENT GOALS: work on balance   OBJECTIVE:    TODAY'S TREATMENT: 11/26/2022 Activity Comments  NuStep, Level 3, BLEs, x 6 minutes For aerobic warm up; keeps SPM >80 (attempted vitals on pulse ox-cannot get it to read); pt fatigued, but no dizziness, lightheadedness or adverse effects  Seated hamstring stretch, foot propped on stool 2 x 30 sec, BLE, some relief Of hamstring cramp  Standing gastroc stretch, 3 x 30 sec at counter Good form  Heel/toe raises x 10   Stagger standing forward/back x 10 reps  Tactile cues for increased use of hip strategy  Forward/back stepping  To target, cues for increased step length  Sidestep and weightshift To targets, cues for increased step length  Wide BOS lateral weightshifting x 10 reps   Sidestep along counter, 4 reps Working on taking larger (less # of steps)-reduces steps from 7>4  Gait training, 60 ft x 2 reps, then 50 ft, with stop/starts using cane Cues for wider BOS upon stopping and weightshifting through hips to initiate gait easier      PATIENT EDUCATION: Education details: weightshifting and wider BOS to lessen festination with initiation of gait Person educated: Patient Education method: Explanation, Demonstration, Tactile cues, Verbal cues, and Handouts Education comprehension: verbalized understanding and returned demonstration  HOME EXERCISE PROGRAM: Access Code: [email or web address] URL: [email or web address] Date: 11/13/2022 Prepared by: [facility] - Outpatient  Rehab - [facility]  Exercises - Gastroc Stretch on Wall  - 1 x daily - 5 x weekly - 2 sets - 30 sec hold - Sit to Stand Without Arm Support  - 1 x daily - 5 x weekly - 2 sets - 10 reps - Standing with Head Rotation  - 1 x daily - 5 x weekly - 2 sets - 30 sec hold - Romberg Stance with Head Nods  - 1 x daily - 5 x weekly - 2 sets - 30 sec hold - Standing Toe Taps  - 1 x daily - 5 x weekly - 2 sets - 10 reps - Corner Balance Feet Together With Eyes Closed  - 1 x daily - 7 x weekly - 3 sets - 30 sec hold Note: Objective measures were completed at Evaluation unless otherwise noted.  DIAGNOSTIC FINDINGS: none recent  COGNITION: Overall cognitive status: Within functional limits for tasks assessed   SENSATION: Reports baseline N/T in the L lower leg  COORDINATION: Alternating pronation/supination: WNL B Alternating toe tap: WNL B Finger to nose: WNL B  MUSCLE TONE: WNL B LEs   POSTURE: rounded shoulders, forward head, and head flexed slightly L;  dyskinesias at rest  LOWER EXTREMITY ROM:     Active  Right Eval Left Eval  Hip flexion    Hip extension    Hip abduction    Hip adduction    Hip internal rotation    Hip external rotation    Knee flexion    Knee extension    Ankle dorsiflexion 5 18  Ankle plantarflexion    Ankle inversion    Ankle eversion     (Blank rows = not tested)  LOWER EXTREMITY MMT:    MMT (in sitting) Right Eval Left Eval  Hip flexion 5 5  Hip extension    Hip abduction 4+ 4+  Hip adduction 4+ 4+  Hip internal rotation    Hip external rotation    Knee flexion 4+ 5  Knee extension 4+ 5  Ankle dorsiflexion 4+ 4+  Ankle plantarflexion 4+ 4+  Ankle inversion    Ankle eversion    (Blank rows = not tested)     GAIT: Gait pattern: limited heel-toe pattern, R LE scissoring occasionally; tendency to perform several quick steps ; occasional freezing  Assistive device utilized: None Level of assistance: SBA   FUNCTIONAL TESTS:    Completed 360 degree turn in 10 steps        GOALS: Goals reviewed with patient? Yes  SHORT TERM GOALS: Target date: 12/04/2022  Patient to be independent with initial HEP. Baseline: HEP initiated Goal status: INITIAL    LONG TERM GOALS: Target date: 12/25/2022  Patient to be independent with advanced HEP. Baseline: Not yet initiated  Goal status: INITIAL  Patient to verbalize understanding of local Parkinson's Disease community resources including community fitness post D/C.   Baseline: Not yet initiated  Goal status: INITIAL  Patient to improve MiniBestTest score to atleast 17-21 to decrease risk of falls.  Baseline: 13/28 Goal status: IN PROGRESS  Patient to complete 360 degree turn in 8 steps or less.  Baseline: 10 steps Goal status: INITIAL  Patient to demonstrate 5xSTS test in <15 sec without posterior LOB in order to decrease risk of falls.   Baseline: 9.2 sec with posterior lean/LOB Goal status: INITIAL  Patient to verbalize  tips to reduce freezing/festination with gait and turns. Baseline: Not yet initiated  Goal status: INITIAL   ASSESSMENT:  CLINICAL IMPRESSION: Pt arrives today, reporting fatigue more than usual; she reports not sleeping well last night plus having to take medications this morning later than usual due to 9 am cardiologist appointment.  She demo narrow BOS with static standing, with festination upon trying to initiate gait and turns.  She responds well to step strategy work, as well as cues for wider BOS lateral weightshifting.  She initially has difficulty with use of hip strategy with increased weightshfiting through hips, but with repetition, she improves, even with addition of gait stop/start task.  She will likely need additional repetition to reinforce these strategies to improve balance and gait carryover outside of therapy sessions.  OBJECTIVE IMPAIRMENTS: Abnormal gait, decreased balance, decreased ROM, decreased safety awareness, increased muscle spasms, impaired flexibility, and postural dysfunction.   ACTIVITY LIMITATIONS: carrying, lifting, bending, standing, squatting, stairs, transfers, bathing, toileting, dressing, reach over head, hygiene/grooming, and locomotion level  PARTICIPATION LIMITATIONS: meal prep, cleaning, laundry, driving, shopping, community activity, and church  PERSONAL FACTORS: Age, Past/current experiences, Time since onset of injury/illness/exacerbation, and 3+ comorbidities: Pacemaker, Anemia, atrial tach, a-flutter,  chronic anticoagulation, CHF, HLD, HTN, osteoporosis, PD, a-fib, femur fx surgery 2011, L partial knee replacement 2018, wrist surgery  are also affecting patient's functional outcome.   REHAB POTENTIAL: Good  CLINICAL DECISION MAKING: Evolving/moderate complexity  EVALUATION COMPLEXITY: Moderate  PLAN:  PT FREQUENCY: 1-2x/week  PT DURATION: 6 weeks  PLANNED INTERVENTIONS: 97146- PT Re-evaluation, 97110-Therapeutic exercises, 97530-  Therapeutic activity, 97112- Neuromuscular re-education, 97535- Self Care, 16109- Manual therapy, [phone]- Gait training, [phone]- Canalith repositioning, [id number]- Aquatic Therapy, 97014- Electrical stimulation (unattended), Patient/Family education, Balance training, Stair training, Taping, Dry Needling, Joint mobilization, Vestibular training, Cryotherapy, Moist heat, Therapeutic exercises, Therapeutic activity, Neuromuscular re-education, Gait training, and Self Care  PLAN FOR NEXT SESSION: Review weightshifting tips to lessen festination with starting gait; balance strategy work, Nustep for aerobic warm up, edu on PD community resources and freezing episode tips, push-release training   Lonia Blood, PT 11/26/22 1:00 PM Phone: [phone] Fax: [phone]  [facility] at [facility] [address], [address] [place], [place] [address] Phone # [phone] Fax # [phone]

## 2022-11-26 NOTE — Patient Instructions (Signed)
No change in medications. Labs today - will call you if abnormal. Return to see Dr. Shirlee Latch in 4 months. Please call us at [phone] in January to schedule this appointment.  Please call us at [phone] if any questions or concerns prior to your next visit.

## 2022-11-26 NOTE — Progress Notes (Signed)
ReDS Vest / Clip - 11/26/22 0900       ReDS Vest / Clip   Station Marker A    Ruler Value 27    ReDS Value Range Low volume    ReDS Actual Value 34

## 2022-11-26 NOTE — Telephone Encounter (Signed)
Called patient per Prince Rome, NP with following results and instructions:  "Tsat and ferritin are low, likely contributing to her fatigue. Recommend iron infusion if she is agreeable. She is also followed by Dr Pamelia Hoit and has follow up with him next month if she would like to defer to him. I will forward labs to Dr. Pamelia Hoit."  Pt prefers to get IV iron infusion here. Orders placed, we will obtain prior authorization if necessary, and call patient to schedule. Pt verbalized understanding of same.   Forwarded to [employer], CMA.

## 2022-12-01 ENCOUNTER — Ambulatory Visit: Payer: Medicare Other

## 2022-12-01 ENCOUNTER — Other Ambulatory Visit (HOSPITAL_COMMUNITY): Payer: Self-pay | Admitting: Nurse Practitioner

## 2022-12-01 DIAGNOSIS — H353132 Nonexudative age-related macular degeneration, bilateral, intermediate dry stage: Secondary | ICD-10-CM | POA: Diagnosis not present

## 2022-12-01 DIAGNOSIS — H02883 Meibomian gland dysfunction of right eye, unspecified eyelid: Secondary | ICD-10-CM | POA: Diagnosis not present

## 2022-12-01 DIAGNOSIS — H4052X3 Glaucoma secondary to other eye disorders, left eye, severe stage: Secondary | ICD-10-CM | POA: Diagnosis not present

## 2022-12-01 DIAGNOSIS — H00016 Hordeolum externum left eye, unspecified eyelid: Secondary | ICD-10-CM | POA: Diagnosis not present

## 2022-12-01 DIAGNOSIS — H35721 Serous detachment of retinal pigment epithelium, right eye: Secondary | ICD-10-CM | POA: Diagnosis not present

## 2022-12-01 DIAGNOSIS — H0016 Chalazion left eye, unspecified eyelid: Secondary | ICD-10-CM | POA: Diagnosis not present

## 2022-12-01 DIAGNOSIS — H353211 Exudative age-related macular degeneration, right eye, with active choroidal neovascularization: Secondary | ICD-10-CM | POA: Diagnosis not present

## 2022-12-01 DIAGNOSIS — C787 Secondary malignant neoplasm of liver and intrahepatic bile duct: Secondary | ICD-10-CM

## 2022-12-01 DIAGNOSIS — H43812 Vitreous degeneration, left eye: Secondary | ICD-10-CM | POA: Diagnosis not present

## 2022-12-01 DIAGNOSIS — H353221 Exudative age-related macular degeneration, left eye, with active choroidal neovascularization: Secondary | ICD-10-CM | POA: Diagnosis not present

## 2022-12-01 DIAGNOSIS — H02886 Meibomian gland dysfunction of left eye, unspecified eyelid: Secondary | ICD-10-CM | POA: Diagnosis not present

## 2022-12-02 NOTE — Progress Notes (Unsigned)
Bennie Dallas, MD  Claudean Kinds PROCEDURE / BIOPSY REVIEW Date: 12/02/22  Requested Biopsy site: Liver mass Reason for request: suspected mets Imaging review: Best seen on CT AP 11/18/22 (2/39, 7/39) - seg III  Decision: Approved Imaging modality to perform: Ultrasound Schedule with: Moderate Sedation Schedule for: Any VIR  Additional comments: none  Please contact me with questions, concerns, or if issue pertaining to this request arise.  Bennie Dallas, MD [facility]       Previous Messages    ----- Message ----- From: Claudean Kinds Sent: 12/01/2022   2:29 PM EDT To: Caroleen Hamman, NT; Ir Procedure Requests Subject: US BIOPSY (LIVER)                              Procedure : US BIOPSY (LIVER)   Reason : patient with up to 20 new lesion in liver concerning for metastasis with no known primary Dx: Metastasis to liver (HCC) [C78.7 (ICD-10-CM)]     History : Ct abdomen w/wo contrast and US abdomen limited RUQ  Provider: Annamarie Major, CRNP   Provider contact: [phone]

## 2022-12-03 ENCOUNTER — Ambulatory Visit: Payer: Medicare Other

## 2022-12-03 ENCOUNTER — Telehealth: Payer: Self-pay | Admitting: Neurology

## 2022-12-03 ENCOUNTER — Other Ambulatory Visit: Payer: Self-pay

## 2022-12-03 ENCOUNTER — Other Ambulatory Visit (HOSPITAL_COMMUNITY): Payer: Self-pay | Admitting: Family Medicine

## 2022-12-03 DIAGNOSIS — R2689 Other abnormalities of gait and mobility: Secondary | ICD-10-CM | POA: Diagnosis not present

## 2022-12-03 DIAGNOSIS — R2681 Unsteadiness on feet: Secondary | ICD-10-CM

## 2022-12-03 DIAGNOSIS — G20B2 Parkinson's disease with dyskinesia, with fluctuations: Secondary | ICD-10-CM | POA: Diagnosis not present

## 2022-12-03 MED ORDER — RYTARY 36.25-145 MG PO CPCR: 2.0000 | ORAL_CAPSULE | Freq: Every day | ORAL | 0 refills | Status: DC

## 2022-12-03 NOTE — Therapy (Signed)
OUTPATIENT PHYSICAL THERAPY NEURO TREATMENT   Patient Name: [name] [name] MRN: [id number] DOB:[birth year], 82 y.o., female Today's Date: 12/03/2022   PCP: Rodrigo Ran, MD  REFERRING PROVIDER: Tat, Octaviano Batty, DO  END OF SESSION:  PT End of Session - 12/03/22 1614     Visit Number 4    Number of Visits 13    Date for PT Re-Evaluation 12/25/22    Authorization Type Medicare    PT Start Time 1615    PT Stop Time 1700    PT Time Calculation (min) 45 min    Activity Tolerance Patient tolerated treatment well;Patient limited by fatigue    Behavior During Therapy [facility] for tasks assessed/performed              Past Medical History:  Diagnosis Date   Anemia    Arthritis    thumbs   Atrial tachycardia (HCC)    Atypical atrial flutter (HCC)    Cataract    bilateral   Chronic anticoagulation    on Xarelto   Chronic diastolic CHF (congestive heart failure) (HCC)    Echocardiogram (11/24/12): Mild LVH, EF 60%.   Dysrhythmia    GERD (gastroesophageal reflux disease)    hx of no meds   Heart murmur    slight   Hyperlipidemia    a. Hx of leg weakness while on statin. under control   Hypertension    Hypothyroidism    Leg fracture Dec 21,2011   Osteoporosis 07/2007   Parkinson's disease (HCC) 2011   Persistent atrial fibrillation (HCC)    s/p atrial fib ablation 11-11-11.    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HCC)    Squamous carcinoma summer 2015   back of left thigh   Wrist fracture    right   Past Surgical History:  Procedure Laterality Date   ATRIAL FIBRILLATION ABLATION N/A 11/11/2011   Procedure: ATRIAL FIBRILLATION ABLATION;  Surgeon: Hillis Range, MD;  Location: [facility] CATH LAB;  Service: Cardiovascular;  Laterality: N/A;   BREAST BIOPSY  1998   BREAST SURGERY     benign cyst removed    CARDIAC ELECTROPHYSIOLOGY STUDY AND ABLATION  5/14, 7/14   convergent AF ablation at [facility] and subsequent atypical atrial flutter ablation by Dr Hurman Horn   CARDIOVASCULAR STRESS TEST  06-06-2008   EF 73%   CARDIOVERSION  09/09/2011   Procedure: CARDIOVERSION;  Surgeon: Cassell Clement, MD;  Location: [facility] ENDOSCOPY;  Service: Cardiovascular;  Laterality: N/A;  to be done by dr. Patty Sermons   CARDIOVERSION  02/18/2012   Procedure: CARDIOVERSION;  Surgeon: Vesta Mixer, MD;  Location: [facility] ENDOSCOPY;  Service: Cardiovascular;  Laterality: N/A;   CHOLECYSTECTOMY  1995   FEMUR FRACTURE SURGERY  2011   metal pin in place   FOREARM SURGERY Left 2010   "opened arm to drain it"   INSERTION OF MESH N/A 11/24/2013   Procedure: INSERTION OF MESH;  Surgeon: Karie Soda, MD;  Location: [facility] ORS;  Service: General;  Laterality: N/A;   IR THORACENTESIS ASP PLEURAL SPACE W/IMG GUIDE  02/25/2022   KNEE SURGERY Left 2001   Left unicompartmental knee     Dr. Charlann Boxer 11/18/16   PACEMAKER PLACEMENT  10/20/12   MDT Advisa DR implanted by Dr Hurman Horn at [facility]    PARTIAL KNEE ARTHROPLASTY Left 11/18/2016  Procedure: LEFT UNICOMPARTMENTAL KNEE Medially;  Surgeon: Durene Romans, MD;  Location: [facility] ORS;  Service: Orthopedics;  Laterality: Left;  90 mins   PPM GENERATOR CHANGEOUT N/A 07/17/2021   Procedure: PPM GENERATOR CHANGEOUT;  Surgeon: Regan Lemming, MD;  Location: [facility] INVASIVE CV LAB;  Service: Cardiovascular;  Laterality: N/A;   RIGHT HEART CATH N/A 06/13/2021   Procedure: RIGHT HEART CATH;  Surgeon: Laurey Morale, MD;  Location: [facility] INVASIVE CV LAB;  Service: Cardiovascular;  Laterality: N/A;   RIGHT HEART CATH N/A 03/06/2022   Procedure: RIGHT HEART CATH;  Surgeon: Laurey Morale, MD;  Location: [facility] INVASIVE CV LAB;  Service: Cardiovascular;  Laterality: N/A;   SKIN CANCER DESTRUCTION  summer 2015   TEE WITHOUT CARDIOVERSION  11/10/2011   Procedure: TRANSESOPHAGEAL ECHOCARDIOGRAM (TEE);  Surgeon: Vesta Mixer, MD;   Location: [facility] ENDOSCOPY;  Service: Cardiovascular;  Laterality: N/A;   TEE WITHOUT CARDIOVERSION N/A 04/23/2022   Procedure: TRANSESOPHAGEAL ECHOCARDIOGRAM (TEE);  Surgeon: Laurey Morale, MD;  Location: [facility] ENDOSCOPY;  Service: Cardiovascular;  Laterality: N/A;   TONSILLECTOMY  as child   US ECHOCARDIOGRAPHY  03-10-2008   Est EF 55-60%, Dr. 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 05/08/2021   Aortic atherosclerosis (HCC) 12/10/2020   Normocytic anemia 07/09/2018   Cirrhosis (HCC) 11/09/2017   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/18/2016   Bilateral impacted cerumen 02/21/2016   Presbycusis of both ears 02/21/2016   Atypical atrial flutter (HCC) 05/28/2015   Diastolic dysfunction 06/13/2014   Ventral incisional hernia - epigastric 07/21/2013   Long term (current) use of anticoagulants 07/21/2013   Chronic diastolic heart failure (HCC) 12/01/2012   Paralysis agitans (HCC) 10/26/2012   Pacemaker 10/20/2012   Sick sinus syndrome (HCC) 10/20/2012   GERD (gastroesophageal reflux disease) 06/09/2012   Hyperlipidemia 06/09/2012   Parkinson's disease (HCC) 06/09/2012   Osteoporosis 06/09/2012   Hypertension 10/12/2011   Chest pain 06/03/2011   Atrial fibrillation (HCC) 10/31/2010    ONSET DATE: 10+ years   REFERRING DIAG: G20.B2 (ICD-10-CM) - Parkinson's disease with dyskinesia and fluctuating manifestations (HCC)  THERAPY  DIAG:  Unsteadiness on feet  Other abnormalities of gait and mobility  Rationale for Evaluation and Treatment: Rehabilitation  SUBJECTIVE:  SUBJECTIVE STATEMENT: Not doing great today.  Didn't sleep well last night and had two meetings today so tired.    Pt accompanied by: self  PERTINENT HISTORY: Pacemaker, Anemia, atrial tach, a-flutter, chronic anticoagulation, CHF, HLD, HTN, osteoporosis, PD, a-fib, femur fx surgery 2011, L partial knee replacement 2018, wrist surgery  PAIN:  Are you having pain? No  PRECAUTIONS: Fall and ICD/Pacemaker  RED FLAGS: None   WEIGHT BEARING RESTRICTIONS: No  FALLS: Has patient fallen in last 6 months? Yes. Number of falls 3; reports 1 fall resulted in head trauma but "I had sunglasses on so it only hit the sunglasses."  LIVING ENVIRONMENT: Lives with: lives with their spouse Lives in: Other independent living at [place], getting ready to move in another home within [employer] Stairs: No Has following equipment at home: Single point cane and Shower bench  PLOF: Independent; likes reading, walking, and working puzzles  PATIENT GOALS: work on balance   OBJECTIVE:   TODAY'S TREATMENT: 12/03/22 Activity Comments  HEP review Supervision with printed sheets for reference. Reports non-compliance  Forward/backward narrow space   Single limb support/weight shift drill   4-square step 3x clockwise/counter   Multisensory balance    LE stretching -static holds x 60 sec posterior chain to improve flexibility and reduce rigidity     TODAY'S TREATMENT: 11/26/2022 Activity Comments  NuStep, Level 3, BLEs, x 6 minutes For aerobic warm up; keeps SPM >80 (attempted vitals on pulse ox-cannot get it to read); pt fatigued, but no dizziness, lightheadedness or adverse  effects  Seated hamstring stretch, foot propped on stool 2 x 30 sec, BLE, some relief Of hamstring cramp  Standing gastroc stretch, 3 x 30 sec at counter Good form  Heel/toe raises x 10   Stagger standing forward/back x 10 reps Tactile cues for increased use of hip strategy  Forward/back stepping  To target, cues for increased step length  Sidestep and weightshift To targets, cues for increased step length  Wide BOS lateral weightshifting x 10 reps   Sidestep along counter, 4 reps Working on taking larger (less # of steps)-reduces steps from 7>4  Gait training, 60 ft x 2 reps, then 50 ft, with stop/starts using cane Cues for wider BOS upon stopping and weightshifting through hips to initiate gait easier      PATIENT EDUCATION: Education details: weightshifting and wider BOS to lessen festination with initiation of gait Person educated: Patient Education method: Explanation, Demonstration, Tactile cues, Verbal cues, and Handouts Education comprehension: verbalized understanding and returned demonstration  HOME EXERCISE PROGRAM: Access Code: [email or web address] URL: [email or web address] Date: 11/13/2022 Prepared by: [facility] - Outpatient  Rehab - [facility]  Exercises - Gastroc Stretch on Wall  - 1 x daily - 5 x weekly - 2 sets - 30 sec hold - Sit to Stand Without Arm Support  - 1 x daily - 5 x weekly - 2 sets - 10 reps - Standing with Head Rotation  - 1 x daily - 5 x weekly - 2 sets - 30 sec hold - Romberg Stance with Head Nods  - 1 x daily - 5 x weekly - 2 sets - 30 sec hold - Standing Toe Taps  - 1 x daily - 5 x weekly - 2 sets - 10 reps - Corner Balance Feet Together With Eyes Closed  - 1 x daily - 7 x weekly - 3 sets - 30 sec hold  Note: Objective measures were completed at Evaluation unless otherwise noted.  DIAGNOSTIC FINDINGS: none  recent  COGNITION: Overall cognitive status: Within functional limits for tasks assessed   SENSATION: Reports baseline N/T in  the L lower leg  COORDINATION: Alternating pronation/supination: WNL B Alternating toe tap: WNL B Finger to nose: WNL B   MUSCLE TONE: WNL B LEs   POSTURE: rounded shoulders, forward head, and head flexed slightly L; dyskinesias at rest  LOWER EXTREMITY ROM:     Active  Right Eval Left Eval  Hip flexion    Hip extension    Hip abduction    Hip adduction    Hip internal rotation    Hip external rotation    Knee flexion    Knee extension    Ankle dorsiflexion 5 18  Ankle plantarflexion    Ankle inversion    Ankle eversion     (Blank rows = not tested)  LOWER EXTREMITY MMT:    MMT (in sitting) Right Eval Left Eval  Hip flexion 5 5  Hip extension    Hip abduction 4+ 4+  Hip adduction 4+ 4+  Hip internal rotation    Hip external rotation    Knee flexion 4+ 5  Knee extension 4+ 5  Ankle dorsiflexion 4+ 4+  Ankle plantarflexion 4+ 4+  Ankle inversion    Ankle eversion    (Blank rows = not tested)     GAIT: Gait pattern: limited heel-toe pattern, R LE scissoring occasionally; tendency to perform several quick steps ; occasional freezing  Assistive device utilized: None Level of assistance: SBA   FUNCTIONAL TESTS:    Completed 360 degree turn in 10 steps        GOALS: Goals reviewed with patient? Yes  SHORT TERM GOALS: Target date: 12/04/2022  Patient to be independent with initial HEP. Baseline: HEP initiated Goal status: NOT MET    LONG TERM GOALS: Target date: 12/25/2022  Patient to be independent with advanced HEP. Baseline: Not yet initiated  Goal status: INITIAL  Patient to verbalize understanding of local Parkinson's Disease community resources including community fitness post D/C.   Baseline: Not yet initiated  Goal status: INITIAL  Patient to improve MiniBestTest score to atleast 17-21 to decrease risk of falls.  Baseline: 13/28 Goal status: IN PROGRESS  Patient to complete 360 degree turn in 8 steps or less.  Baseline:  10 steps Goal status: INITIAL  Patient to demonstrate 5xSTS test in <15 sec without posterior LOB in order to decrease risk of falls.   Baseline: 9.2 sec with posterior lean/LOB Goal status: INITIAL  Patient to verbalize tips to reduce freezing/festination with gait and turns. Baseline: Not yet initiated  Goal status: INITIAL   ASSESSMENT:  CLINICAL IMPRESSION: Reports ongoing fatigue from lack of sleep and busy day.  Review of HEP requiring supervision for cues in activity and sequence with pt reporting non-compliance. Provided with re-print of current HEP for reference.  Continued with activities to improve stability and single limb support and negotiating tight spaces to address freezing of gait.  Multi-sensory balance activities to improve postural stability with good return demonstration demo mild-moderate sway with compliant surfaces/eyes closed conditions. Ending session with static stretching to posterior chain to improve flexibility and reduce rigidity. Continued sessions to progress POC details to improve mobility  OBJECTIVE IMPAIRMENTS: Abnormal gait, decreased balance, decreased ROM, decreased safety awareness, increased muscle spasms, impaired flexibility, and postural dysfunction.   ACTIVITY LIMITATIONS: carrying, lifting, bending, standing, squatting, stairs, transfers, bathing, toileting, dressing, reach over head, hygiene/grooming, and locomotion level  PARTICIPATION LIMITATIONS: meal prep, cleaning,  laundry, driving, shopping, community activity, and church  PERSONAL FACTORS: Age, Past/current experiences, Time since onset of injury/illness/exacerbation, and 3+ comorbidities: Pacemaker, Anemia, atrial tach, a-flutter, chronic anticoagulation, CHF, HLD, HTN, osteoporosis, PD, a-fib, femur fx surgery 2011, L partial knee replacement 2018, wrist surgery  are also affecting patient's functional outcome.   REHAB POTENTIAL: Good  CLINICAL DECISION MAKING: Evolving/moderate  complexity  EVALUATION COMPLEXITY: Moderate  PLAN:  PT FREQUENCY: 1-2x/week  PT DURATION: 6 weeks  PLANNED INTERVENTIONS: 97146- PT Re-evaluation, 97110-Therapeutic exercises, 97530- Therapeutic activity, 97112- Neuromuscular re-education, 97535- Self Care, 53664- Manual therapy, [phone]- Gait training, [phone]- Canalith repositioning, [phone]- Aquatic Therapy, 97014- Electrical stimulation (unattended), Patient/Family education, Balance training, Stair training, Taping, Dry Needling, Joint mobilization, Vestibular training, Cryotherapy, Moist heat, Therapeutic exercises, Therapeutic activity, Neuromuscular re-education, Gait training, and Self Care  PLAN FOR NEXT SESSION: Review weightshifting tips to lessen festination with starting gait; balance strategy work, Nustep for aerobic warm up, edu on PD community resources and freezing episode tips, push-release training   5:01 PM, 12/03/22 M. Shary Decamp, PT, DPT Physical Therapist- [place] Office Number: [phone]

## 2022-12-03 NOTE — Telephone Encounter (Signed)
Left message with the after hour service on 12-02-22 at 4:36 pm   Caller states that she was given samples of rytary  36.25/145 mg  and it seems to be working she would like a Advertising account executive for this   She uses [employer]

## 2022-12-03 NOTE — Telephone Encounter (Signed)
Dr. Arbutus Leas is appears that another CMA discontinued this medication. Discontinued by: Orlene Och, [place] on 11/26/2022 09:11  I have reentered the med and sent in for this patient

## 2022-12-03 NOTE — Progress Notes (Unsigned)
RE: hold Eliquis Received: Today Camnitz, Will Daphine Deutscher, MD  Claudean Kinds She would be okay to hold Eliquis for biopsy.  Usually would hold for 2 days prior to procedure and restart as soon as possible postprocedure.       Previous Messages    ----- Message ----- From: Claudean Kinds Sent: 12/02/2022  10:21 AM EDT To: Will Jorja Loa, MD; Claudean Kinds Subject: hold Eliquis                                  Hi Dr. Elberta Fortis,  We have this patient scheduled for a biopsy procedure for 12/17/22 - wanted to get the okay to hold patient Eliquis for 48 hrs prior.   Thank you , Gari Trovato X.

## 2022-12-04 ENCOUNTER — Other Ambulatory Visit: Payer: Self-pay | Admitting: Cardiology

## 2022-12-04 DIAGNOSIS — I4811 Longstanding persistent atrial fibrillation: Secondary | ICD-10-CM

## 2022-12-04 NOTE — Telephone Encounter (Signed)
Spoke with pt and made her aware of dose change. Pt verbalized understanding. Refill sent.

## 2022-12-04 NOTE — Telephone Encounter (Signed)
Prescription refill request for Eliquis received. Indication: Afib  Last office visit: 11/26/22 [facility])  Scr: 1.12 (11/26/22)  Age: 82 Weight: 58.3kg  Current dose not appropriate. Will forward to pharmD for review.

## 2022-12-04 NOTE — Telephone Encounter (Signed)
Called pt to discuss dose change, no answer. Left message with call back number.

## 2022-12-08 ENCOUNTER — Encounter: Payer: Self-pay | Admitting: Physical Therapy

## 2022-12-08 ENCOUNTER — Ambulatory Visit: Payer: Medicare Other | Attending: Neurology | Admitting: Physical Therapy

## 2022-12-08 DIAGNOSIS — R2681 Unsteadiness on feet: Secondary | ICD-10-CM

## 2022-12-08 DIAGNOSIS — R2689 Other abnormalities of gait and mobility: Secondary | ICD-10-CM

## 2022-12-08 NOTE — Patient Instructions (Signed)
Tips to reduce freezing episodes with standing or walking:  Stand tall with your feet wide, so that you can rock and weight shift through your hips. Don't try to fight the freeze: if you begin taking slower, faster, smaller steps, STOP, get your posture tall, and RESET your posture and balance.  Take a deep breath before taking the BIG step to start again. March in place, with high knee stepping, to get started walking again. Use auditory cues:  Count out loud, think of a familiar tune or song or cadence, use pocket metronome, to use rhythm to get started walking again. Use visual cues:  Use a line to step over, use laser pointer line to step over, (using BIG steps) to start walking again. Use visual targets to keep your posture tall (look ahead and focus on an object or target at eye level). As you approach where your destination with walking, count your steps out loud and/or focus on your target with your eyes until you are fully there. Use appropriate assistive device, as advised by your physical therapist to assist with taking longer, consistent steps.  

## 2022-12-10 ENCOUNTER — Encounter: Payer: Self-pay | Admitting: Physical Therapy

## 2022-12-10 ENCOUNTER — Ambulatory Visit: Payer: Medicare Other | Admitting: Physical Therapy

## 2022-12-10 DIAGNOSIS — R2681 Unsteadiness on feet: Secondary | ICD-10-CM | POA: Diagnosis not present

## 2022-12-10 DIAGNOSIS — R2689 Other abnormalities of gait and mobility: Secondary | ICD-10-CM | POA: Diagnosis not present

## 2022-12-10 NOTE — Therapy (Signed)
OUTPATIENT PHYSICAL THERAPY NEURO TREATMENT   Patient Name: [name] [name] MRN: [id number] DOB:04-26-1940, 82 y.o., female Today's Date: 12/10/2022   PCP: Rodrigo Ran, MD  REFERRING PROVIDER: Tat, Octaviano Batty, DO  END OF SESSION:  PT End of Session - 12/10/22 1612     Visit Number 6    Number of Visits 13    Date for PT Re-Evaluation 12/25/22    Authorization Type Medicare    PT Start Time 1615    PT Stop Time 1659    PT Time Calculation (min) 44 min    Equipment Utilized During Treatment Gait belt    Activity Tolerance Patient tolerated treatment well;Patient limited by fatigue    Behavior During Therapy WFL for tasks assessed/performed                Past Medical History:  Diagnosis Date   Anemia    Arthritis    thumbs   Atrial tachycardia (HCC)    Atypical atrial flutter (HCC)    Cataract    bilateral   Chronic anticoagulation    on Xarelto   Chronic diastolic CHF (congestive heart failure) (HCC)    Echocardiogram (11/24/12): Mild LVH, EF 60%.   Dysrhythmia    GERD (gastroesophageal reflux disease)    hx of no meds   Heart murmur    slight   Hyperlipidemia    a. Hx of leg weakness while on statin. under control   Hypertension    Hypothyroidism    Leg fracture Dec 21,2011   Osteoporosis 07/2007   Parkinson's disease (HCC) 2011   Persistent atrial fibrillation (HCC)    s/p atrial fib ablation 11-11-11.    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HCC)    Squamous carcinoma summer 2015   back of left thigh   Wrist fracture    right   Past Surgical History:  Procedure Laterality Date   ATRIAL FIBRILLATION ABLATION N/A 11/11/2011   Procedure: ATRIAL FIBRILLATION ABLATION;  Surgeon: Hillis Range, MD;  Location: [facility] CATH LAB;  Service: Cardiovascular;  Laterality: N/A;   BREAST BIOPSY   1998   BREAST SURGERY     benign cyst removed   CARDIAC ELECTROPHYSIOLOGY STUDY AND ABLATION  5/14, 7/14   convergent AF ablation at [facility] and subsequent atypical atrial flutter ablation by Dr Hurman Horn   CARDIOVASCULAR STRESS TEST  06-06-2008   EF 73%   CARDIOVERSION  09/09/2011   Procedure: CARDIOVERSION;  Surgeon: Cassell Clement, MD;  Location: [facility] ENDOSCOPY;  Service: Cardiovascular;  Laterality: N/A;  to be done by dr. Patty Sermons   CARDIOVERSION  02/18/2012   Procedure: CARDIOVERSION;  Surgeon: Vesta Mixer, MD;  Location: [facility] ENDOSCOPY;  Service: Cardiovascular;  Laterality: N/A;   CHOLECYSTECTOMY  1995   FEMUR FRACTURE SURGERY  2011   metal pin in place   FOREARM SURGERY Left 2010   "opened arm to drain it"   INSERTION OF MESH N/A 11/24/2013   Procedure: INSERTION OF MESH;  Surgeon: Karie Soda, MD;  Location: [facility] ORS;  Service: General;  Laterality: N/A;   IR THORACENTESIS ASP PLEURAL SPACE W/IMG GUIDE  02/25/2022   KNEE SURGERY Left 2001   Left unicompartmental knee     Dr. Charlann Boxer 11/18/16   PACEMAKER PLACEMENT  10/20/12   MDT Advisa DR implanted by Dr Hurman Horn  at [facility]    PARTIAL KNEE ARTHROPLASTY Left 11/18/2016   Procedure: LEFT UNICOMPARTMENTAL KNEE Medially;  Surgeon: Durene Romans, MD;  Location: [facility] ORS;  Service: Orthopedics;  Laterality: Left;  90 mins   PPM GENERATOR CHANGEOUT N/A 07/17/2021   Procedure: PPM GENERATOR CHANGEOUT;  Surgeon: Regan Lemming, MD;  Location: [facility] INVASIVE CV LAB;  Service: Cardiovascular;  Laterality: N/A;   RIGHT HEART CATH N/A 06/13/2021   Procedure: RIGHT HEART CATH;  Surgeon: Laurey Morale, MD;  Location: [facility] INVASIVE CV LAB;  Service: Cardiovascular;  Laterality: N/A;   RIGHT HEART CATH N/A 03/06/2022   Procedure: RIGHT HEART CATH;  Surgeon: Laurey Morale, MD;  Location: [facility] INVASIVE CV LAB;  Service: Cardiovascular;  Laterality: N/A;   SKIN CANCER DESTRUCTION  summer 2015   TEE WITHOUT CARDIOVERSION  11/10/2011   Procedure: TRANSESOPHAGEAL  ECHOCARDIOGRAM (TEE);  Surgeon: Vesta Mixer, MD;  Location: [facility] ENDOSCOPY;  Service: Cardiovascular;  Laterality: N/A;   TEE WITHOUT CARDIOVERSION N/A 04/23/2022   Procedure: TRANSESOPHAGEAL ECHOCARDIOGRAM (TEE);  Surgeon: Laurey Morale, MD;  Location: [facility] ENDOSCOPY;  Service: Cardiovascular;  Laterality: N/A;   TONSILLECTOMY  as child   US ECHOCARDIOGRAPHY  03-10-2008   Est EF 55-60%, Dr. 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 05/08/2021   Aortic atherosclerosis (HCC) 12/10/2020   Normocytic anemia 07/09/2018   Cirrhosis (HCC) 11/09/2017   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/18/2016   Bilateral impacted cerumen 02/21/2016   Presbycusis of both ears 02/21/2016   Atypical atrial flutter (HCC) 05/28/2015   Diastolic dysfunction 06/13/2014   Ventral incisional hernia - epigastric 07/21/2013   Long term (current) use of anticoagulants 07/21/2013   Chronic diastolic heart failure (HCC) 12/01/2012   Paralysis agitans (HCC) 10/26/2012   Pacemaker 10/20/2012   Sick sinus syndrome (HCC) 10/20/2012   GERD (gastroesophageal reflux disease) 06/09/2012   Hyperlipidemia 06/09/2012   Parkinson's disease (HCC) 06/09/2012   Osteoporosis 06/09/2012   Hypertension 10/12/2011   Chest pain 06/03/2011   Atrial fibrillation (HCC) 10/31/2010    ONSET DATE: 10+ years   REFERRING DIAG: G20.B2 (ICD-10-CM) - Parkinson's disease with  dyskinesia and fluctuating manifestations (HCC)  THERAPY DIAG:  Unsteadiness on feet  Other abnormalities of gait and mobility  Rationale for Evaluation and Treatment: Rehabilitation  SUBJECTIVE:  SUBJECTIVE STATEMENT: Today is just a down day.  I just am so tired.  One other episode that day earlier this week where legs gave way, but the wall stopped me.  Pt accompanied by: self  PERTINENT HISTORY: Pacemaker, Anemia, atrial tach, a-flutter, chronic anticoagulation, CHF, HLD, HTN, osteoporosis, PD, a-fib, femur fx surgery 2011, L partial knee replacement 2018, wrist surgery  PAIN:  Are you having pain? No  PRECAUTIONS: Fall and ICD/Pacemaker  RED FLAGS: None   WEIGHT BEARING RESTRICTIONS: No  FALLS: Has patient fallen in last 6 months? Yes. Number of falls 3; reports 1 fall resulted in head trauma but "I had sunglasses on so it only hit the sunglasses."  LIVING ENVIRONMENT: Lives with: lives with their spouse Lives in: Other independent living at [place], getting ready to move in another home within [place] Stairs: No Has following equipment at home: Single point cane and Shower bench  PLOF: Independent; likes reading, walking, and working puzzles  PATIENT GOALS: work on balance   OBJECTIVE:    TODAY'S TREATMENT: 12/10/2022 Activity Comments  Vitals 123/86 HR 94, O2 98%   Nustep L5 x 2 min BLEs only, then L3 x 3 min UEs/BLEs Cues to stay near 80 bpm to avoid overexertion; she tends to stay >90  Vitals 96%, 100 bpm   Turning practice 360 turn L:  8 steps, 8 steps, 6 steps 360 turn R:  9 steps, 8 and 8 3 reps; cues for sidestep turn  Side step and weightshift Back step and weightshift Forward step and weightshift 2 x 5 reps  Standing on [employer]:  forward step and weightshift x 5  reps Forward/back step and weightshift 2 x 5   Vitals:  93%, HR 67-80 bpm   Quarter turns R and L      PATIENT EDUCATION: Education details: review of freezing tips, edu on PD community resources- pt reports most interest in virtual classes  Person educated: Patient Education method: Explanation, Demonstration, Tactile cues, Verbal cues, and Handouts Education comprehension: verbalized understanding and returned demonstration   HOME EXERCISE PROGRAM:  Access Code: [email or web address] URL: [email or web address] Date: 12/10/2022 Prepared by: [facility]  Exercises - Sit to Stand Without Arm Support  - 1 x daily - 5 x weekly - 2 sets - 10 reps - Standing with Head Rotation  - 1 x daily - 5 x weekly - 2 sets - 30 sec hold - Romberg Stance with Head Nods  - 1 x daily - 5 x weekly - 2 sets - 30 sec hold - Corner Balance Feet Together With Eyes Closed  - 1 x daily - 7 x weekly - 3 sets - 30 sec hold - Standing Toe Taps  - 1 x daily - 5 x weekly - 2 sets - 10 reps - Gastroc Stretch on Wall  - 1 x daily - 5 x weekly - 2 sets - 30 sec hold - Standing Quarter Turn with Counter Support  - 1 x daily - 7 x weekly - 1 sets - 3-5 reps      Note: Objective measures were completed at Evaluation unless otherwise noted.  DIAGNOSTIC FINDINGS: none recent  COGNITION: Overall cognitive status: Within functional limits for tasks assessed   SENSATION: Reports baseline N/T in the L lower leg  COORDINATION: Alternating pronation/supination: WNL B Alternating toe tap: WNL B Finger to nose: WNL B   MUSCLE TONE: WNL B LEs   POSTURE: rounded shoulders, forward head,  and head flexed slightly L; dyskinesias at rest  LOWER EXTREMITY ROM:     Active  Right Eval Left Eval  Hip flexion    Hip extension    Hip abduction    Hip adduction    Hip internal rotation    Hip external rotation    Knee flexion    Knee extension    Ankle dorsiflexion 5 18   Ankle plantarflexion    Ankle inversion    Ankle eversion     (Blank rows = not tested)  LOWER EXTREMITY MMT:    MMT (in sitting) Right Eval Left Eval  Hip flexion 5 5  Hip extension    Hip abduction 4+ 4+  Hip adduction 4+ 4+  Hip internal rotation    Hip external rotation    Knee flexion 4+ 5  Knee extension 4+ 5  Ankle dorsiflexion 4+ 4+  Ankle plantarflexion 4+ 4+  Ankle inversion    Ankle eversion    (Blank rows = not tested)     GAIT: Gait pattern: limited heel-toe pattern, R LE scissoring occasionally; tendency to perform several quick steps ; occasional freezing  Assistive device utilized: None Level of assistance: SBA   FUNCTIONAL TESTS:    Completed 360 degree turn in 10 steps        GOALS: Goals reviewed with patient? Yes  SHORT TERM GOALS: Target date: 12/04/2022  Patient to be independent with initial HEP. Baseline: HEP initiated Goal status: NOT MET    LONG TERM GOALS: Target date: 12/25/2022  Patient to be independent with advanced HEP. Baseline: Not yet initiated  Goal status: IN PROGRESS  Patient to verbalize understanding of local Parkinson's Disease community resources including community fitness post D/C.   Baseline: Not yet initiated  Goal status: IN PROGRESS  Patient to improve MiniBestTest score to atleast 17-21 to decrease risk of falls.  Baseline: 13/28 Goal status: IN PROGRESS  Patient to complete 360 degree turn in 8 steps or less.  Baseline: 10 steps Goal status: IN PROGRESS  Patient to demonstrate 5xSTS test in <15 sec without posterior LOB in order to decrease risk of falls.   Baseline: 9.2 sec with posterior lean/LOB Goal status: IN PROGRESS  Patient to verbalize tips to reduce freezing/festination with gait and turns. Baseline: Not yet initiated  Goal status: IN PROGRESS   ASSESSMENT:  CLINICAL IMPRESSION: Pt arrives today with no additional reports of chest issues; she does report one additional  episode of legs giving way after PT session the other day (same day she had several episodes).  Vitals taken at start of session WFL, and she does fatigue throughout warm up and other standing balance activities.  PT advised pt to follow up with cardiologist given continued overall fatigue, BLE weakness/legs giving way, and the episode of "hot burst" in her chest.  Added quarter turns to HEP today to address initiating turns.    OBJECTIVE IMPAIRMENTS: Abnormal gait, decreased balance, decreased ROM, decreased safety awareness, increased muscle spasms, impaired flexibility, and postural dysfunction.   ACTIVITY LIMITATIONS: carrying, lifting, bending, standing, squatting, stairs, transfers, bathing, toileting, dressing, reach over head, hygiene/grooming, and locomotion level  PARTICIPATION LIMITATIONS: meal prep, cleaning, laundry, driving, shopping, community activity, and church  PERSONAL FACTORS: Age, Past/current experiences, Time since onset of injury/illness/exacerbation, and 3+ comorbidities: Pacemaker, Anemia, atrial tach, a-flutter, chronic anticoagulation, CHF, HLD, HTN, osteoporosis, PD, a-fib, femur fx surgery 2011, L partial knee replacement 2018, wrist surgery  are also affecting patient's functional outcome.  REHAB POTENTIAL: Good  CLINICAL DECISION MAKING: Evolving/moderate complexity  EVALUATION COMPLEXITY: Moderate  PLAN:  PT FREQUENCY: 1-2x/week  PT DURATION: 6 weeks  PLANNED INTERVENTIONS: 97146- PT Re-evaluation, 97110-Therapeutic exercises, 97530- Therapeutic activity, 97112- Neuromuscular re-education, 97535- Self Care, 56387- Manual therapy, [phone]- Gait training, [phone]- Canalith repositioning, [id number]- Aquatic Therapy, 97014- Electrical stimulation (unattended), Patient/Family education, Balance training, Stair training, Taping, Dry Needling, Joint mobilization, Vestibular training, Cryotherapy, Moist heat, Therapeutic exercises, Therapeutic activity, Neuromuscular  re-education, Gait training, and Self Care  PLAN FOR NEXT SESSION: Continue working on turns; Review weightshifting tips to lessen festination with starting gait; balance strategy work, Nustep for aerobic warm up, push-release training, working towards [employer].      Lonia Blood, PT 12/10/22 5:16 PM Phone: [phone] Fax: [phone]  [facility] at [facility] [address] [place], [address] [place], [place] [address] Phone # [phone] Fax # [phone]

## 2022-12-12 ENCOUNTER — Other Ambulatory Visit: Payer: Self-pay | Admitting: Hematology and Oncology

## 2022-12-15 ENCOUNTER — Ambulatory Visit: Payer: Medicare Other | Admitting: Physical Therapy

## 2022-12-15 DIAGNOSIS — H0014 Chalazion left upper eyelid: Secondary | ICD-10-CM | POA: Diagnosis not present

## 2022-12-16 ENCOUNTER — Other Ambulatory Visit: Payer: Self-pay | Admitting: Urology

## 2022-12-16 ENCOUNTER — Other Ambulatory Visit: Payer: Self-pay | Admitting: Radiology

## 2022-12-16 DIAGNOSIS — K769 Liver disease, unspecified: Secondary | ICD-10-CM

## 2022-12-16 DIAGNOSIS — R7989 Other specified abnormal findings of blood chemistry: Secondary | ICD-10-CM

## 2022-12-16 MED ORDER — SODIUM CHLORIDE 0.9% FLUSH: 10.0000 mL | Freq: Two times a day (BID) | INTRAVENOUS | Status: AC

## 2022-12-17 ENCOUNTER — Other Ambulatory Visit: Payer: Self-pay

## 2022-12-17 ENCOUNTER — Ambulatory Visit: Payer: Medicare Other

## 2022-12-17 ENCOUNTER — Encounter (HOSPITAL_COMMUNITY): Payer: Self-pay

## 2022-12-17 ENCOUNTER — Ambulatory Visit (HOSPITAL_COMMUNITY)
Admission: RE | Admit: 2022-12-17 | Discharge: 2022-12-17 | Disposition: A | Discharge status: Home / Self Care | Payer: Medicare Other | Source: Ambulatory Visit | Attending: Nurse Practitioner | Admitting: Nurse Practitioner

## 2022-12-17 DIAGNOSIS — C787 Secondary malignant neoplasm of liver and intrahepatic bile duct: Secondary | ICD-10-CM

## 2022-12-17 DIAGNOSIS — I5032 Chronic diastolic (congestive) heart failure: Secondary | ICD-10-CM | POA: Diagnosis not present

## 2022-12-17 DIAGNOSIS — K7460 Unspecified cirrhosis of liver: Secondary | ICD-10-CM | POA: Diagnosis not present

## 2022-12-17 DIAGNOSIS — Z8719 Personal history of other diseases of the digestive system: Secondary | ICD-10-CM | POA: Diagnosis not present

## 2022-12-17 DIAGNOSIS — R160 Hepatomegaly, not elsewhere classified: Secondary | ICD-10-CM | POA: Insufficient documentation

## 2022-12-17 DIAGNOSIS — K769 Liver disease, unspecified: Secondary | ICD-10-CM

## 2022-12-17 LAB — CBC
HCT: 30.7 % — ABNORMAL LOW (ref 36.0–46.0)
Hemoglobin: 9.1 g/dL — ABNORMAL LOW (ref 12.0–15.0)
MCH: 23.3 pg — ABNORMAL LOW (ref 26.0–34.0)
MCHC: 29.6 g/dL — ABNORMAL LOW (ref 30.0–36.0)
MCV: 78.5 fL — ABNORMAL LOW (ref 80.0–100.0)
Platelets: 254 10*3/uL (ref 150–400)
RBC: 3.91 MIL/uL (ref 3.87–5.11)
RDW: 17.9 % — ABNORMAL HIGH (ref 11.5–15.5)
WBC: 5.8 10*3/uL (ref 4.0–10.5)
nRBC: 0 % (ref 0.0–0.2)

## 2022-12-17 LAB — PROTIME-INR
INR: 1.1 (ref 0.8–1.2)
Prothrombin Time: 14.2 s (ref 11.4–15.2)

## 2022-12-17 MED ORDER — GELATIN ABSORBABLE 12-7 MM EX MISC
1.0000 | Freq: Once | CUTANEOUS | Status: AC
  Administered 2022-12-17: 1 via TOPICAL

## 2022-12-17 MED ORDER — FENTANYL CITRATE (PF) 100 MCG/2ML IJ SOLN
INTRAMUSCULAR | Status: AC | PRN
  Administered 2022-12-17 (×2): 25 ug via INTRAVENOUS

## 2022-12-17 MED ORDER — MIDAZOLAM HCL 2 MG/2ML IJ SOLN
INTRAMUSCULAR | Status: AC
  Filled 2022-12-17: qty 2

## 2022-12-17 MED ORDER — FENTANYL CITRATE (PF) 100 MCG/2ML IJ SOLN
INTRAMUSCULAR | Status: AC
  Filled 2022-12-17: qty 2

## 2022-12-17 MED ORDER — LIDOCAINE HCL (PF) 1 % IJ SOLN
10.0000 mL | Freq: Once | INTRAMUSCULAR | Status: AC
  Administered 2022-12-17: 10 mL via INTRADERMAL

## 2022-12-17 MED ORDER — SODIUM CHLORIDE 0.9% FLUSH: 10.0000 mL | Freq: Two times a day (BID) | INTRAVENOUS | Status: DC

## 2022-12-17 MED ORDER — MIDAZOLAM HCL 2 MG/2ML IJ SOLN
INTRAMUSCULAR | Status: AC | PRN
  Administered 2022-12-17 (×2): .5 mg via INTRAVENOUS

## 2022-12-17 MED ORDER — LIDOCAINE HCL (PF) 1 % IJ SOLN
INTRAMUSCULAR | Status: AC
  Filled 2022-12-17: qty 30

## 2022-12-17 NOTE — H&P (Signed)
Chief Complaint: Patient was seen in consultation today for liver biopsy at the request of Drazek,Dawn  Referring Physician(s): Drazek,Dawn  Supervising Physician: Gilmer Mor  Patient Status: [facility] - Out-pt  History of Present Illness: [name] [name] is a 82 y.o. female   82 y.o. 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   Patient is followed by Cresenciano Genre CRNP for cirrhosis. 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 Now with decompensated cirrhosis complicated by ascites.  In February 2024 was found to have a left pleural effusion requiring thoracentesis. She had a left thoracentesis. Limited fluid analysis was performed on the peritoneal fluid however pleural LDH to serum LDH ratio and cell count did not suggest hepatic hydrothorax.   Patient had a Doppler ultrasound in March 2024 with patent portal vein and no focal liver lesions. Doppler US on 9/25 demonstrated few hypoechoic lesions within the liver which were indeterminate.  Patient is unable to undergo MRI due to pacemaker.  CTA w/wo contrast on 10/15: FINDINGS: Dominant 14 mm enhancing lesion in segment 8 (series 2/image 19), new from recent CTA chest. 5-8 additional lesions evident on the arterial phase, measuring up to 14 mm in segment 2 (series 2/image 89), new. Multiple additional subcentimeter lesions on the portal venous phase, approximately 20 in total. Overall, this appearance favors metastatic disease.  Status post cholecystectomy. No intrahepatic or extrahepatic duct dilatation.  IMPRESSION: Cirrhosis.   Numerous new hepatic lesions, as described above, suspicious for metastases.   Primary neoplasm is not visualized in  the abdomen. Please note that the pelvis was not imaged.  IR was requested to perform a diagnostic liver biopsy. Patient is scheduled for same today.     Past Medical History:  Diagnosis Date   Anemia    Arthritis    thumbs   Atrial tachycardia (HCC)    Atypical atrial flutter (HCC)    Cataract    bilateral   Chronic anticoagulation    on Xarelto   Chronic diastolic CHF (congestive heart failure) (HCC)    Echocardiogram (11/24/12): Mild LVH, EF 60%.   Dysrhythmia    GERD (gastroesophageal reflux disease)    hx of no meds   Heart murmur    slight   Hyperlipidemia    a. Hx of leg weakness while on statin. under control   Hypertension    Hypothyroidism    Leg fracture Dec 21,2011   Osteoporosis 07/2007   Parkinson's disease (HCC) 2011   Persistent atrial fibrillation (HCC)    s/p atrial fib ablation 11-11-11.    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HCC)    Squamous carcinoma summer 2015   back of left thigh   Wrist fracture    right    Past Surgical History:  Procedure Laterality Date   ATRIAL FIBRILLATION ABLATION N/A 11/11/2011   Procedure: ATRIAL FIBRILLATION ABLATION;  Surgeon: Hillis Range, MD;  Location: [facility] CATH LAB;  Service: Cardiovascular;  Laterality: N/A;   BREAST BIOPSY  1998   BREAST SURGERY     benign cyst removed   CARDIAC ELECTROPHYSIOLOGY STUDY AND ABLATION  5/14, 7/14  convergent AF ablation at [facility] and subsequent atypical atrial flutter ablation by Dr Hurman Horn   CARDIOVASCULAR STRESS TEST  06-06-2008   EF 73%   CARDIOVERSION  09/09/2011   Procedure: CARDIOVERSION;  Surgeon: Cassell Clement, MD;  Location: [facility] ENDOSCOPY;  Service: Cardiovascular;  Laterality: N/A;  to be done by dr. Patty Sermons   CARDIOVERSION  02/18/2012   Procedure: CARDIOVERSION;  Surgeon: Vesta Mixer, MD;  Location: [facility]  ENDOSCOPY;  Service: Cardiovascular;  Laterality: N/A;   CHOLECYSTECTOMY  1995   FEMUR FRACTURE SURGERY  2011   metal pin in place   FOREARM SURGERY Left 2010   "opened arm to drain it"   INSERTION OF MESH N/A 11/24/2013   Procedure: INSERTION OF MESH;  Surgeon: Karie Soda, MD;  Location: [facility] ORS;  Service: General;  Laterality: N/A;   IR THORACENTESIS ASP PLEURAL SPACE W/IMG GUIDE  02/25/2022   KNEE SURGERY Left 2001   Left unicompartmental knee     Dr. Charlann Boxer 11/18/16   PACEMAKER PLACEMENT  10/20/12   MDT Advisa DR implanted by Dr Hurman Horn at [facility]    PARTIAL KNEE ARTHROPLASTY Left 11/18/2016   Procedure: LEFT UNICOMPARTMENTAL KNEE Medially;  Surgeon: Durene Romans, MD;  Location: [facility] ORS;  Service: Orthopedics;  Laterality: Left;  90 mins   PPM GENERATOR CHANGEOUT N/A 07/17/2021   Procedure: PPM GENERATOR CHANGEOUT;  Surgeon: Regan Lemming, MD;  Location: [facility] INVASIVE CV LAB;  Service: Cardiovascular;  Laterality: N/A;   RIGHT HEART CATH N/A 06/13/2021   Procedure: RIGHT HEART CATH;  Surgeon: Laurey Morale, MD;  Location: [facility] INVASIVE CV LAB;  Service: Cardiovascular;  Laterality: N/A;   RIGHT HEART CATH N/A 03/06/2022   Procedure: RIGHT HEART CATH;  Surgeon: Laurey Morale, MD;  Location: [facility] INVASIVE CV LAB;  Service: Cardiovascular;  Laterality: N/A;   SKIN CANCER DESTRUCTION  summer 2015   TEE WITHOUT CARDIOVERSION  11/10/2011   Procedure: TRANSESOPHAGEAL ECHOCARDIOGRAM (TEE);  Surgeon: Vesta Mixer, MD;  Location: [facility] ENDOSCOPY;  Service: Cardiovascular;  Laterality: N/A;   TEE WITHOUT CARDIOVERSION N/A 04/23/2022   Procedure: TRANSESOPHAGEAL ECHOCARDIOGRAM (TEE);  Surgeon: Laurey Morale, MD;  Location: [facility] ENDOSCOPY;  Service: Cardiovascular;  Laterality: N/A;   TONSILLECTOMY  as child   US ECHOCARDIOGRAPHY  03-10-2008   Est EF 55-60%, Dr. Melburn Popper   VENTRAL HERNIA REPAIR N/A 11/24/2013   Procedure: LAPAROSCOPIC VENTRAL HERNIA;  Surgeon: Karie Soda, MD;  Location: [facility] ORS;   Service: General;  Laterality: N/A;   WRIST SURGERY Right ~2009   metal rod in place    Allergies: Amiodarone and Statins  Medications: Prior to Admission medications   Medication Sig Start Date End Date Taking? Authorizing Provider  acetaminophen (TYLENOL) 500 MG tablet Take 1,000 mg by mouth at bedtime as needed for mild pain, moderate pain or headache.   Yes [provider]  Acetylcysteine (NAC 600) 600 MG CAPS Take 600 mg by mouth in the morning.   Yes [provider]  BELSOMRA 20 MG TABS Take 20 mg by mouth at bedtime as needed (sleep). 07/05/21  Yes [provider]  Calcium Citrate-Vitamin D (CITRACAL + D PO) Take 1 tablet by mouth every evening.   Yes [provider]  carbidopa-levodopa (SINEMET IR) 25-100 MG tablet TAKE 2 TABLETS BY MOUTH 3 TIMES DAILY. 10/13/22  Yes Tat, Octaviano Batty, DO  Carbidopa-Levodopa ER (RYTARY) 36.25-145 MG CPCR Take 2 tablets by mouth at bedtime. 12/03/22  Yes Tat, Octaviano Batty, DO  cholecalciferol (VITAMIN  D3) 25 MCG (1000 UNIT) tablet Take 1,000 Units by mouth in the morning.   Yes [provider]  Cyanocobalamin (VITAMIN B12) 1000 MCG TBCR Take 1,000 mcg by mouth in the morning.   Yes [provider]  docusate sodium (COLACE) 100 MG capsule Take 100 mg by mouth 2 (two) times daily.   Yes [provider]  GLUTATHIONE PO Take 2 capsules by mouth in the morning.   Yes [provider]  levothyroxine (SYNTHROID) 50 MCG tablet Take 50-100 mcg by mouth See admin instructions. Take 2 tablets (100 mcg) by mouth on Mondays and Thursdays in the morning. Take 1 tablet (50 mcg) by mouth on Sundays, Tuesdays, Wednesdays, Fridays & Saturdays. 05/07/19  Yes [provider]  magnesium oxide (MAG-OX) 400 MG tablet Take 400 mg by mouth at bedtime.   Yes [provider]  metoprolol succinate (TOPROL-XL) 25 MG 24 hr tablet TAKE ONE TABLET BY MOUTH EVERYDAY AT BEDTIME 08/25/22  Yes Camnitz, Will  Daphine Deutscher, MD  multivitamin-lutein [facility]) CAPS capsule Take 1 capsule by mouth in the morning.   Yes [provider]  NON FORMULARY Take 1 tablet by mouth at bedtime. MetaCalm Stress Regulation Formula   Yes [provider]  polycarbophil (FIBERCON) 625 MG tablet Take 1,250 mg by mouth in the morning.   Yes [provider]  potassium chloride (MICRO-K) 10 MEQ CR capsule TAKE 6 CAPSULES EVERY MORNING AND AT NOON 12/03/22  Yes [place], [place], [place]  Probiotic Product (PROBIOTIC PO) Take 1 capsule by mouth every evening.   Yes [provider]  rasagiline (AZILECT) 1 MG TABS tablet Take 1 tablet (1 mg total) by mouth daily. 10/13/22  Yes Tat, Octaviano Batty, DO  torsemide (DEMADEX) 20 MG tablet Take 4 tablets (80 mg total) by mouth every morning AND 3 tablets (60 mg total) every evening. 10/27/22  Yes Laurey Morale, MD  vitamin C (ASCORBIC ACID) 500 MG tablet Take 500 mg by mouth in the morning.   Yes [provider]  apixaban (ELIQUIS) 2.5 MG TABS tablet Take 1 tablet (2.5 mg total) by mouth 2 (two) times daily. 12/04/22   Nahser, Deloris Ping, MD  B-D 3CC LUER-LOK SYR 25GX1" 25G X 1" 3 ML MISC Inject 1 mL into the muscle once a week. 05/05/22   [provider]  Carbidopa-Levodopa ER 23.75-95 MG CPCR Take by mouth.    [provider]  cyanocobalamin (,VITAMIN B-12,) 1000 MCG/ML injection INJECT INTRAMUSCULARLY IN THE DELTOID ONCE A MONTH (ALTERNATING DELT. EACH MONTH) 09/24/18   [provider]  spironolactone (ALDACTONE) 25 MG tablet Take 2 tablets (50 mg total) by mouth daily. 10/27/22   Laurey Morale, MD  valACYclovir (VALTREX) 1000 MG tablet Take 1,000 mg by mouth 2 (two) times daily as needed (fever blisters).    [provider]     Family History  Problem Relation Age of Onset   Alzheimer's disease Mother    Heart failure Father    Healthy Child    Healthy Child    Breast cancer Maternal Aunt     Social  History   Socioeconomic History   Marital status: Married    Spouse name: Not on file   Number of children: 2   Years of education: Not on file   Highest education level: Master's degree (e.g., MA, MS, MEng, MEd, MSW, MBA)  Occupational History   Not on file  Tobacco Use   Smoking status: Never  Smokeless tobacco: Never  Vaping Use   Vaping status: Never Used  Substance and Sexual Activity   Alcohol use: No   Drug use: No   Sexual activity: Yes    Partners: Male    Birth control/protection: Post-menopausal  Other Topics Concern   Not on file  Social History Narrative   Lives in [place].  Retired Comptroller.   Right handed    Social Determinants of Health   Financial Resource Strain: Not on file  Food Insecurity: Low Risk  (03/11/2022)   [facility]   Hunger Vital Sign    Worried About Running Out of Food in the Last Year: Never true    Ran Out of Food in the Last Year: Never true  Transportation Needs: Not on file (03/11/2022)  Physical Activity: Not on file  Stress: Not on file  Social Connections: Not on file    Review of Systems: A 12 point ROS discussed and pertinent positives are indicated in the HPI above.  All other systems are negative.  Review of Systems  Constitutional:  Negative for activity change and fever.  Respiratory:  Negative for chest tightness, shortness of breath and wheezing.   Cardiovascular:  Negative for chest pain.  Skin:  Negative for color change.  Psychiatric/Behavioral:  Negative for behavioral problems and confusion.     Vital Signs: BP (!) 114/55   Pulse 91   Temp 98 F (36.7 C) (Oral)   Resp 17   Ht 5\' 1"  (1.549 m)   Wt 125 lb (56.7 kg)   LMP 02/04/1996   SpO2 97%   BMI 23.62 kg/m   Advance Care Plan: The advanced care plan/surrogate decision maker was discussed at the time of visit and documented in the medical record.    Physical Exam Constitutional:      General: She is not in acute  distress.    Appearance: Normal appearance.  HENT:     Mouth/Throat:     Mouth: Mucous membranes are moist.  Cardiovascular:     Rate and Rhythm: Normal rate and regular rhythm.     Heart sounds: No murmur heard. Pulmonary:     Effort: Pulmonary effort is normal. No respiratory distress.     Breath sounds: Normal breath sounds.  Abdominal:     General: There is no distension.  Skin:    General: Skin is warm and dry.  Neurological:     Mental Status: She is oriented to person, place, and time.  Psychiatric:        Mood and Affect: Mood normal.        Behavior: Behavior normal.        Thought Content: Thought content normal.        Judgment: Judgment normal.     Imaging: CT ABDOMEN W WO CONTRAST  Result Date: 11/28/2022 CLINICAL DATA:  Liver lesions on ultrasound EXAM: CT ABDOMEN WITHOUT AND WITH CONTRAST TECHNIQUE: Multidetector CT imaging of the abdomen was performed following the standard protocol before and following the bolus administration of intravenous contrast. RADIATION DOSE REDUCTION: This exam was performed according to the departmental dose-optimization program which includes automated exposure control, adjustment of the mA and/or kV according to patient size and/or use of iterative reconstruction technique. CONTRAST:  ISOVUE-300 IOPAMIDOL (ISOVUE-300) INJECTION 61% COMPARISON:  Right upper quadrant ultrasound dated 10/29/2022. Partial comparison to CTA chest dated 02/19/2022. CT abdomen dated 12/11/2020. FINDINGS: Lower chest: Lung bases are clear. Hepatobiliary: Cirrhosis. Dominant 14 mm enhancing  lesion in segment 8 (series 2/image 19), new from recent CTA chest. 5-8 additional lesions evident on the arterial phase, measuring up to 14 mm in segment 2 (series 2/image 89), new. Multiple additional subcentimeter lesions on the portal venous phase, approximately 20 in total. Overall, this appearance favors metastatic disease. Status post cholecystectomy. No intrahepatic  or extrahepatic duct dilatation. Pancreas: Within normal limits. Spleen: Probable splenic cyst (series 7/image 38). Adrenals/Urinary Tract: Adrenal glands are within normal limits. Bilateral renal cysts, measuring up to 15 mm in the posterior left upper kidney (series 12/image 50), benign (Bosniak I). No hydronephrosis. Stomach/Bowel: Stomach is within normal limits. Visualized bowel is grossly unremarkable. Specifically, no colonic wall thickening or mass is seen, noting incomplete visualization. Vascular/Lymphatic: No evidence of abdominal aortic aneurysm. Atherosclerotic calcifications of the abdominal aorta and branch vessels, although vessels remain patent. No suspicious abdominal lymphadenopathy. Other: No abdominal ascites. Musculoskeletal: Degenerative changes of the lumbar spine. IMPRESSION: Cirrhosis. Numerous new hepatic lesions, as described above, suspicious for metastases. Primary neoplasm is not visualized in the abdomen. Please note that the pelvis was not imaged. Electronically Signed   By: Charline Bills M.D.   On: 11/28/2022 00:15    Labs:  CBC: Recent Labs    05/27/22 0854 07/16/22 1415 11/26/22 1012 12/17/22 0729  WBC 5.6 5.7 6.6 5.8  HGB 10.5* 8.8* 8.9* 9.1*  HCT 33.0* 28.7* 29.9* 30.7*  PLT 225 235 268 254    COAGS: Recent Labs    12/17/22 0729  INR 1.1    BMP: Recent Labs    05/30/22 0906 07/16/22 1415 11/06/22 1110 11/26/22 1012  NA 139 138 132* 131*  K 4.0 4.0 4.3 4.6  CL 100 98 95* 95*  CO2 30 29 27 29   GLUCOSE 110* 80 130* 89  BUN 20 25* 24* 21  CALCIUM 9.3 9.3 9.2 9.6  CREATININE 1.10* 1.11* 1.14* 1.12*  GFRNONAA 51* 50* 49* 50*    LIVER FUNCTION TESTS: Recent Labs    02/20/22 0913 03/11/22 1007  BILITOT 2.2* 1.5*  AST 27 19  ALT <5 <5  ALKPHOS 74 88  PROT 6.1* 6.9  ALBUMIN 3.4* 4.1    TUMOR MARKERS: No results for input(s): "AFPTM", "CEA", "CA199", "CHROMGRNA" in the last 8760 hours.  Assessment and Plan:  History of  cirrhosis with new and numerous liver lesions on imaging.  Patient presents for liver biopsy in IR today. Risks and benefits of liver biopsy was discussed with the patient and/or patient's family including, but not limited to bleeding, infection, damage to adjacent structures or low yield requiring additional tests.  All of the questions were answered and there is agreement to proceed.  Consent signed and in chart.   Thank you for this interesting consult.  I greatly enjoyed meeting [name] [name] and look forward to participating in their care.  A copy of this report was sent to the requesting provider on this date.  Electronically Signed: Sable Feil, PA-C 12/17/2022, 7:59 AM   I spent a total of  30 Minutes   in face to face in clinical consultation, greater than 50% of which was counseling/coordinating care for liver biopsy

## 2022-12-17 NOTE — Procedures (Signed)
Interventional Radiology Procedure Note  Procedure:   US guided liver mass biopsy.   Findings: Difficult US findings.  Very subtle  Complications: None  Recommendations:  - Ok to shower tomorrow - Do not submerge for 7 days - Routine line care  - follow up path - 2 hrs dc home - advance diet - ok to restart eliquis tomorrow - OTC and ice prn  Signed,  Yvone Neu. Loreta Ave, DO

## 2022-12-19 ENCOUNTER — Ambulatory Visit (HOSPITAL_COMMUNITY)
Admission: RE | Admit: 2022-12-19 | Discharge: 2022-12-19 | Disposition: A | Discharge status: Home / Self Care | Payer: Medicare Other | Source: Ambulatory Visit | Attending: Cardiology | Admitting: Cardiology

## 2022-12-19 DIAGNOSIS — I5032 Chronic diastolic (congestive) heart failure: Secondary | ICD-10-CM | POA: Insufficient documentation

## 2022-12-19 DIAGNOSIS — D649 Anemia, unspecified: Secondary | ICD-10-CM | POA: Diagnosis not present

## 2022-12-19 MED ORDER — FERUMOXYTOL INJECTION 510 MG/17 ML
510.0000 mg | Freq: Once | INTRAVENOUS | Status: DC
Start: 2022-12-19 — End: 2022-12-19

## 2022-12-19 MED ORDER — SODIUM CHLORIDE 0.9 % IV SOLN
510.0000 mg | Freq: Once | INTRAVENOUS | Status: AC
  Administered 2022-12-19: 510 mg via INTRAVENOUS
  Filled 2022-12-19: qty 510

## 2022-12-22 ENCOUNTER — Ambulatory Visit: Payer: Medicare Other

## 2022-12-22 DIAGNOSIS — R2681 Unsteadiness on feet: Secondary | ICD-10-CM

## 2022-12-22 DIAGNOSIS — R2689 Other abnormalities of gait and mobility: Secondary | ICD-10-CM

## 2022-12-22 NOTE — Therapy (Signed)
OUTPATIENT PHYSICAL THERAPY NEURO TREATMENT, Progress Note, and Recertification   Patient Name: [name] [name] MRN: [id number] DOB:08-19-1940, 82 y.o., female Today's Date: 12/22/2022   PCP: Rodrigo Ran, MD  REFERRING PROVIDER: Vladimir Faster, DO Progress Note Reporting Period 11/13/22 to 12/22/22  See note below for Objective Data and Assessment of Progress/Goals.      END OF SESSION:  PT End of Session - 12/22/22 1611     Visit Number 7    Number of Visits 13    Date for PT Re-Evaluation 12/25/22    Authorization Type Medicare    PT Start Time 1615    PT Stop Time 1700    PT Time Calculation (min) 45 min    Equipment Utilized During Treatment Gait belt    Activity Tolerance Patient tolerated treatment well;Patient limited by fatigue    Behavior During Therapy WFL for tasks assessed/performed                Past Medical History:  Diagnosis Date   Anemia    Arthritis    thumbs   Atrial tachycardia (HCC)    Atypical atrial flutter (HCC)    Cataract    bilateral   Chronic anticoagulation    on Xarelto   Chronic diastolic CHF (congestive heart failure) (HCC)    Echocardiogram (11/24/12): Mild LVH, EF 60%.   Dysrhythmia    GERD (gastroesophageal reflux disease)    hx of no meds   Heart murmur    slight   Hyperlipidemia    a. Hx of leg weakness while on statin. under control   Hypertension    Hypothyroidism    Leg fracture Dec 21,2011   Osteoporosis 07/2007   Parkinson's disease (HCC) 2011   Persistent atrial fibrillation (HCC)    s/p atrial fib ablation 11-11-11.    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HCC)    Squamous carcinoma summer 2015   back of left thigh   Wrist fracture    right   Past Surgical History:  Procedure Laterality Date   ATRIAL FIBRILLATION ABLATION N/A  11/11/2011   Procedure: ATRIAL FIBRILLATION ABLATION;  Surgeon: Hillis Range, MD;  Location: [facility] CATH LAB;  Service: Cardiovascular;  Laterality: N/A;   BREAST BIOPSY  1998   BREAST SURGERY     benign cyst removed   CARDIAC ELECTROPHYSIOLOGY STUDY AND ABLATION  5/14, 7/14   convergent AF ablation at [facility] and subsequent atypical atrial flutter ablation by Dr Hurman Horn   CARDIOVASCULAR STRESS TEST  06-06-2008   EF 73%   CARDIOVERSION  09/09/2011   Procedure: CARDIOVERSION;  Surgeon: Cassell Clement, MD;  Location: [facility] ENDOSCOPY;  Service: Cardiovascular;  Laterality: N/A;  to be done by dr. Patty Sermons   CARDIOVERSION  02/18/2012   Procedure: CARDIOVERSION;  Surgeon: Vesta Mixer, MD;  Location: [facility] ENDOSCOPY;  Service: Cardiovascular;  Laterality: N/A;   CHOLECYSTECTOMY  1995   FEMUR FRACTURE SURGERY  2011   metal pin in place   FOREARM SURGERY Left 2010   "opened arm to drain it"   INSERTION OF MESH N/A 11/24/2013   Procedure: INSERTION OF MESH;  Surgeon: Karie Soda, MD;  Location: [facility] ORS;  Service: General;  Laterality: N/A;   IR THORACENTESIS ASP PLEURAL SPACE W/IMG GUIDE  02/25/2022   KNEE SURGERY Left 2001  Left unicompartmental knee     Dr. Charlann Boxer 11/18/16   PACEMAKER PLACEMENT  10/20/12   MDT Advisa DR implanted by Dr Hurman Horn at [facility]    PARTIAL KNEE ARTHROPLASTY Left 11/18/2016   Procedure: LEFT UNICOMPARTMENTAL KNEE Medially;  Surgeon: Durene Romans, MD;  Location: [facility] ORS;  Service: Orthopedics;  Laterality: Left;  90 mins   PPM GENERATOR CHANGEOUT N/A 07/17/2021   Procedure: PPM GENERATOR CHANGEOUT;  Surgeon: Regan Lemming, MD;  Location: [facility] INVASIVE CV LAB;  Service: Cardiovascular;  Laterality: N/A;   RIGHT HEART CATH N/A 06/13/2021   Procedure: RIGHT HEART CATH;  Surgeon: Laurey Morale, MD;  Location: [facility] INVASIVE CV LAB;  Service: Cardiovascular;  Laterality: N/A;   RIGHT HEART CATH N/A 03/06/2022   Procedure: RIGHT HEART CATH;  Surgeon: Laurey Morale, MD;  Location: [facility]  INVASIVE CV LAB;  Service: Cardiovascular;  Laterality: N/A;   SKIN CANCER DESTRUCTION  summer 2015   TEE WITHOUT CARDIOVERSION  11/10/2011   Procedure: TRANSESOPHAGEAL ECHOCARDIOGRAM (TEE);  Surgeon: Vesta Mixer, MD;  Location: [facility] ENDOSCOPY;  Service: Cardiovascular;  Laterality: N/A;   TEE WITHOUT CARDIOVERSION N/A 04/23/2022   Procedure: TRANSESOPHAGEAL ECHOCARDIOGRAM (TEE);  Surgeon: Laurey Morale, MD;  Location: [facility] ENDOSCOPY;  Service: Cardiovascular;  Laterality: N/A;   TONSILLECTOMY  as child   US ECHOCARDIOGRAPHY  03-10-2008   Est EF 55-60%, Dr. 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 05/08/2021   Aortic atherosclerosis (HCC) 12/10/2020   Normocytic anemia 07/09/2018   Cirrhosis (HCC) 11/09/2017   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/18/2016   Bilateral impacted cerumen 02/21/2016   Presbycusis of both ears 02/21/2016   Atypical atrial flutter (HCC) 05/28/2015   Diastolic dysfunction 06/13/2014   Ventral incisional hernia - epigastric 07/21/2013   Long term (current) use of anticoagulants 07/21/2013   Chronic diastolic heart failure (HCC) 12/01/2012   Paralysis agitans (HCC) 10/26/2012   Pacemaker 10/20/2012   Sick sinus syndrome (HCC) 10/20/2012   GERD (gastroesophageal reflux disease) 06/09/2012   Hyperlipidemia 06/09/2012   Parkinson's disease (HCC) 06/09/2012   Osteoporosis 06/09/2012   Hypertension  10/12/2011   Chest pain 06/03/2011   Atrial fibrillation (HCC) 10/31/2010    ONSET DATE: 10+ years   REFERRING DIAG: G20.B2 (ICD-10-CM) - Parkinson's disease with dyskinesia and fluctuating manifestations (HCC)  THERAPY DIAG:  Unsteadiness on feet  Other abnormalities of gait and mobility  Rationale for Evaluation and Treatment: Rehabilitation  SUBJECTIVE:  SUBJECTIVE STATEMENT: No falls in recent hx. Same issues of unsteadiness. Had an iron infusion last week and fatigue has been better since then.  Pt accompanied by: self  PERTINENT HISTORY: Pacemaker, Anemia, atrial tach, a-flutter, chronic anticoagulation, CHF, HLD, HTN, osteoporosis, PD, a-fib, femur fx surgery 2011, L partial knee replacement 2018, wrist surgery  PAIN:  Are you having pain? No  PRECAUTIONS: Fall and ICD/Pacemaker  RED FLAGS: None   WEIGHT BEARING RESTRICTIONS: No  FALLS: Has patient fallen in last 6 months? Yes. Number of falls 3; reports 1 fall resulted in head trauma but "I had sunglasses on so it only hit the sunglasses."  LIVING ENVIRONMENT: Lives with: lives with their spouse Lives in: Other independent living at [place], getting ready to move in another home within [employer] Stairs: No Has following equipment at home: Single point cane and Shower bench  PLOF: Independent; likes reading, walking, and working puzzles  PATIENT GOALS: work on balance   OBJECTIVE:   TODAY'S TREATMENT: 12/22/22 Activity Comments  Mini-BESTest 21/28  Gait speed 3.5 ft/sec  5xSTS 12 sec, good control  Freezing of gait strategies 4 S's; lazer pointer for step initiation  Multisensory balance To improve postural stability and limits of stability and SLS        TODAY'S TREATMENT: 12/10/2022 Activity Comments  Vitals  123/86 HR 94, O2 98%   Nustep L5 x 2 min BLEs only, then L3 x 3 min UEs/BLEs Cues to stay near 80 bpm to avoid overexertion; she tends to stay >90  Vitals 96%, 100 bpm   Turning practice 360 turn L:  8 steps, 8 steps, 6 steps 360 turn R:  9 steps, 8 and 8 3 reps; cues for sidestep turn  Side step and weightshift Back step and weightshift Forward step and weightshift 2 x 5 reps  Standing on [employer]ire[employer]:  forward step and weightshift x 5 reps Forward/back step and weightshift 2 x 5   Vitals:  93%, HR 67-80 bpm   Quarter turns R and L      PATIENT EDUCATION: Education details: review of freezing tips, edu on PD community resources- pt reports most interest in virtual classes  Person educated: Patient Education method: Explanation, Demonstration, Tactile cues, Verbal cues, and Handouts Education comprehension: verbalized understanding and returned demonstration   HOME EXERCISE PROGRAM:  Access Code: [email or web address] URL: [email or web address] Date: 12/10/2022 Prepared by: [facility] - Outpatient  Rehab - [facility]  Exercises - Sit to Stand Without Arm Support  - 1 x daily - 5 x weekly - 2 sets - 10 reps - Standing with Head Rotation  - 1 x daily - 5 x weekly - 2 sets - 30 sec hold - Romberg Stance with Head Nods  - 1 x daily - 5 x weekly - 2 sets - 30 sec hold - Corner Balance Feet Together With Eyes Closed  - 1 x daily - 7 x weekly - 3 sets - 30 sec hold - Standing Toe Taps  - 1 x daily - 5 x weekly - 2 sets - 10 reps - Gastroc Stretch on Wall  - 1 x daily - 5 x weekly - 2 sets - 30 sec hold - Standing Quarter Turn with Counter Support  - 1 x daily - 7 x weekly - 1 sets - 3-5 reps      Note: Objective measures were completed at Evaluation unless otherwise noted.  DIAGNOSTIC FINDINGS: none recent  COGNITION: Overall cognitive status: Within functional limits for tasks  assessed   SENSATION: Reports baseline N/T in the L lower leg  COORDINATION: Alternating  pronation/supination: WNL B Alternating toe tap: WNL B Finger to nose: WNL B   MUSCLE TONE: WNL B LEs   POSTURE: rounded shoulders, forward head, and head flexed slightly L; dyskinesias at rest  LOWER EXTREMITY ROM:     Active  Right Eval Left Eval  Hip flexion    Hip extension    Hip abduction    Hip adduction    Hip internal rotation    Hip external rotation    Knee flexion    Knee extension    Ankle dorsiflexion 5 18  Ankle plantarflexion    Ankle inversion    Ankle eversion     (Blank rows = not tested)  LOWER EXTREMITY MMT:    MMT (in sitting) Right Eval Left Eval  Hip flexion 5 5  Hip extension    Hip abduction 4+ 4+  Hip adduction 4+ 4+  Hip internal rotation    Hip external rotation    Knee flexion 4+ 5  Knee extension 4+ 5  Ankle dorsiflexion 4+ 4+  Ankle plantarflexion 4+ 4+  Ankle inversion    Ankle eversion    (Blank rows = not tested)     GAIT: Gait pattern: limited heel-toe pattern, R LE scissoring occasionally; tendency to perform several quick steps ; occasional freezing  Assistive device utilized: None Level of assistance: SBA   FUNCTIONAL TESTS:    Completed 360 degree turn in 10 steps        GOALS: Goals reviewed with patient? Yes  SHORT TERM GOALS: Target date: 01/05/2023    Patient to be independent with initial HEP. Baseline: HEP initiated Goal status: NOT MET   > Demo improved postural stability per mild sway x 30 sec condition 4 M-CTSIB to improve safety with ADL   Baseline: moderate-severe   Goal status: INITIAL  LONG TERM GOALS: Target date: 01/26/2023    Patient to be independent with advanced HEP and to include HIIT Baseline: in development Goal status: IN PROGRESS  Patient to verbalize understanding of local Parkinson's Disease community resources including community fitness post D/C.   Baseline: in development  Goal status: IN PROGRESS  Patient to improve MiniBestTest score to atleast 17-21 to  decrease risk of falls.  Baseline: 13/28; (12/22/22) 21/28 Goal status: MET  Patient to complete 360 degree turn in 8 steps or less.  Baseline: 10 steps; (12/22/22) 2-4 steps Goal status: MET  Patient to demonstrate 5xSTS test in <15 sec without posterior LOB in order to decrease risk of falls.   Baseline: 9.2 sec with posterior lean/LOB; (12/22/22) 12 sec Goal status: MET  Patient to verbalize tips to reduce freezing/festination with gait and turns. Baseline: in development Goal status: IN PROGRESS   ASSESSMENT:  CLINICAL IMPRESSION: Notes improved symptoms since receiving iron transfusion.  Review of POC details with improved score for Mini-BESTest from initial 13 to 21/28 and improved performance 5xSTS with time indicating low risk for falls and improved control throughout.  Difficulty wit dual-tasking activities disturbing balance and gait cycle.  Review of freezing of gait strategies to improve safety with mobility with mental rehearsal of "stop, sigh, shift, and step" for initiation and trial with laser pointer on ground for step initiation with difficulty sequencing and LOB experienced due to taking too  large a step.  Pt reports non-compliance with HEP and reveals that she has a NU-step at home.  Encouraged pt to use  equipment 2x/wk to improve endurance and for benefit of rapid alternating movement.  Would benefit from continued sessions to progress POC details and develop additional strategies for freezing of gait and train in sequence for high-intensity interval training for its benefits relative to PD.    OBJECTIVE IMPAIRMENTS: Abnormal gait, decreased balance, decreased ROM, decreased safety awareness, increased muscle spasms, impaired flexibility, and postural dysfunction.   ACTIVITY LIMITATIONS: carrying, lifting, bending, standing, squatting, stairs, transfers, bathing, toileting, dressing, reach over head, hygiene/grooming, and locomotion level  PARTICIPATION LIMITATIONS:  meal prep, cleaning, laundry, driving, shopping, community activity, and church  PERSONAL FACTORS: Age, Past/current experiences, Time since onset of injury/illness/exacerbation, and 3+ comorbidities: Pacemaker, Anemia, atrial tach, a-flutter, chronic anticoagulation, CHF, HLD, HTN, osteoporosis, PD, a-fib, femur fx surgery 2011, L partial knee replacement 2018, wrist surgery  are also affecting patient's functional outcome.   REHAB POTENTIAL: Good  CLINICAL DECISION MAKING: Evolving/moderate complexity  EVALUATION COMPLEXITY: Moderate  PLAN:  PT FREQUENCY: 1-2x/week  PT DURATION: 4 weeks  PLANNED INTERVENTIONS: 97146- PT Re-evaluation, 97110-Therapeutic exercises, 97530- Therapeutic activity, 97112- Neuromuscular re-education, 97535- Self Care, 95621- Manual therapy, [phone]- Gait training, [phone]- Canalith repositioning, [id number]- Aquatic Therapy, 97014- Electrical stimulation (unattended), Patient/Family education, Balance training, Stair training, Taping, Dry Needling, Joint mobilization, Vestibular training, Cryotherapy, Moist heat, Therapeutic exercises, Therapeutic activity, Neuromuscular re-education, Gait training, and Self Care  PLAN FOR NEXT SESSION: NU-step HIIT for home performance     5:10 PM, 12/22/22 M. Shary Decamp, PT, DPT Physical Therapist- [place] Office Number: [phone]

## 2022-12-23 ENCOUNTER — Encounter: Payer: Self-pay | Admitting: Cardiology

## 2022-12-23 LAB — SURGICAL PATHOLOGY

## 2022-12-24 ENCOUNTER — Ambulatory Visit: Payer: Medicare Other

## 2022-12-27 ENCOUNTER — Other Ambulatory Visit: Payer: Self-pay | Admitting: Neurology

## 2022-12-27 ENCOUNTER — Other Ambulatory Visit (HOSPITAL_COMMUNITY): Payer: Self-pay | Admitting: Family Medicine

## 2022-12-27 DIAGNOSIS — G20A1 Parkinson's disease without dyskinesia, without mention of fluctuations: Secondary | ICD-10-CM

## 2022-12-27 DIAGNOSIS — G20B1 Parkinson's disease with dyskinesia, without mention of fluctuations: Secondary | ICD-10-CM

## 2022-12-29 ENCOUNTER — Ambulatory Visit: Payer: Medicare Other

## 2022-12-29 DIAGNOSIS — R2689 Other abnormalities of gait and mobility: Secondary | ICD-10-CM

## 2022-12-29 DIAGNOSIS — R2681 Unsteadiness on feet: Secondary | ICD-10-CM

## 2022-12-29 NOTE — Therapy (Signed)
OUTPATIENT PHYSICAL THERAPY NEURO TREATMENT   Patient Name: [name] [name] MRN: [id number] DOB:07/26/1940, 82 y.o., female Today's Date: 12/29/2022   PCP: Rodrigo Ran, MD  REFERRING PROVIDER: Tat, Octaviano Batty, DO     END OF SESSION:  PT End of Session - 12/29/22 1322     Visit Number 8    Number of Visits 15    Date for PT Re-Evaluation 01/26/23    Authorization Type Medicare    PT Start Time 1321    PT Stop Time 1400    PT Time Calculation (min) 39 min    Equipment Utilized During Treatment Gait belt    Activity Tolerance Patient tolerated treatment well;Patient limited by fatigue    Behavior During Therapy WFL for tasks assessed/performed                Past Medical History:  Diagnosis Date   Anemia    Arthritis    thumbs   Atrial tachycardia (HCC)    Atypical atrial flutter (HCC)    Cataract    bilateral   Chronic anticoagulation    on Xarelto   Chronic diastolic CHF (congestive heart failure) (HCC)    Echocardiogram (11/24/12): Mild LVH, EF 60%.   Dysrhythmia    GERD (gastroesophageal reflux disease)    hx of no meds   Heart murmur    slight   Hyperlipidemia    a. Hx of leg weakness while on statin. under control   Hypertension    Hypothyroidism    Leg fracture Dec 21,2011   Osteoporosis 07/2007   Parkinson's disease (HCC) 2011   Persistent atrial fibrillation (HCC)    s/p atrial fib ablation 11-11-11.    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HCC)    Squamous carcinoma summer 2015   back of left thigh   Wrist fracture    right   Past Surgical History:  Procedure Laterality Date   ATRIAL FIBRILLATION ABLATION N/A 11/11/2011   Procedure: ATRIAL FIBRILLATION ABLATION;  Surgeon: Hillis Range, MD;  Location: [facility] CATH LAB;  Service: Cardiovascular;  Laterality: N/A;   BREAST  BIOPSY  1998   BREAST SURGERY     benign cyst removed   CARDIAC ELECTROPHYSIOLOGY STUDY AND ABLATION  5/14, 7/14   convergent AF ablation at [facility] and subsequent atypical atrial flutter ablation by Dr Hurman Horn   CARDIOVASCULAR STRESS TEST  06-06-2008   EF 73%   CARDIOVERSION  09/09/2011   Procedure: CARDIOVERSION;  Surgeon: Cassell Clement, MD;  Location: [facility] ENDOSCOPY;  Service: Cardiovascular;  Laterality: N/A;  to be done by dr. Patty Sermons   CARDIOVERSION  02/18/2012   Procedure: CARDIOVERSION;  Surgeon: Vesta Mixer, MD;  Location: [facility] ENDOSCOPY;  Service: Cardiovascular;  Laterality: N/A;   CHOLECYSTECTOMY  1995   FEMUR FRACTURE SURGERY  2011   metal pin in place   FOREARM SURGERY Left 2010   "opened arm to drain it"   INSERTION OF MESH N/A 11/24/2013   Procedure: INSERTION OF MESH;  Surgeon: Karie Soda, MD;  Location: [facility] ORS;  Service: General;  Laterality: N/A;   IR THORACENTESIS ASP PLEURAL SPACE W/IMG GUIDE  02/25/2022   KNEE SURGERY Left 2001   Left unicompartmental knee     Dr. Charlann Boxer 11/18/16   PACEMAKER PLACEMENT  10/20/12   MDT Advisa DR implanted  by Dr Hurman Horn at [facility]    PARTIAL KNEE ARTHROPLASTY Left 11/18/2016   Procedure: LEFT UNICOMPARTMENTAL KNEE Medially;  Surgeon: Durene Romans, MD;  Location: [facility] ORS;  Service: Orthopedics;  Laterality: Left;  90 mins   PPM GENERATOR CHANGEOUT N/A 07/17/2021   Procedure: PPM GENERATOR CHANGEOUT;  Surgeon: Regan Lemming, MD;  Location: [facility] INVASIVE CV LAB;  Service: Cardiovascular;  Laterality: N/A;   RIGHT HEART CATH N/A 06/13/2021   Procedure: RIGHT HEART CATH;  Surgeon: Laurey Morale, MD;  Location: [facility] INVASIVE CV LAB;  Service: Cardiovascular;  Laterality: N/A;   RIGHT HEART CATH N/A 03/06/2022   Procedure: RIGHT HEART CATH;  Surgeon: Laurey Morale, MD;  Location: [facility] INVASIVE CV LAB;  Service: Cardiovascular;  Laterality: N/A;   SKIN CANCER DESTRUCTION  summer 2015   TEE WITHOUT CARDIOVERSION  11/10/2011   Procedure:  TRANSESOPHAGEAL ECHOCARDIOGRAM (TEE);  Surgeon: Vesta Mixer, MD;  Location: [facility] ENDOSCOPY;  Service: Cardiovascular;  Laterality: N/A;   TEE WITHOUT CARDIOVERSION N/A 04/23/2022   Procedure: TRANSESOPHAGEAL ECHOCARDIOGRAM (TEE);  Surgeon: Laurey Morale, MD;  Location: [facility] ENDOSCOPY;  Service: Cardiovascular;  Laterality: N/A;   TONSILLECTOMY  as child   US ECHOCARDIOGRAPHY  03-10-2008   Est EF 55-60%, Dr. 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 05/08/2021   Aortic atherosclerosis (HCC) 12/10/2020   Normocytic anemia 07/09/2018   Cirrhosis (HCC) 11/09/2017   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/18/2016   Bilateral impacted cerumen 02/21/2016   Presbycusis of both ears 02/21/2016   Atypical atrial flutter (HCC) 05/28/2015   Diastolic dysfunction 06/13/2014   Ventral incisional hernia - epigastric 07/21/2013   Long term (current) use of anticoagulants 07/21/2013   Chronic diastolic heart failure (HCC) 12/01/2012   Paralysis agitans (HCC) 10/26/2012   Pacemaker 10/20/2012   Sick sinus syndrome (HCC) 10/20/2012   GERD (gastroesophageal reflux disease) 06/09/2012   Hyperlipidemia 06/09/2012   Parkinson's disease (HCC) 06/09/2012   Osteoporosis 06/09/2012   Hypertension 10/12/2011   Chest pain 06/03/2011   Atrial fibrillation (HCC) 10/31/2010    ONSET DATE: 10+ years   REFERRING DIAG: G20.B2 (ICD-10-CM) - Parkinson's  disease with dyskinesia and fluctuating manifestations (HCC)  THERAPY DIAG:  Other abnormalities of gait and mobility  Unsteadiness on feet  Rationale for Evaluation and Treatment: Rehabilitation  SUBJECTIVE:  SUBJECTIVE STATEMENT: No falls in recent hx. Same issues of unsteadiness. Had an iron infusion last week and fatigue has been better since then.  Pt accompanied by: self  PERTINENT HISTORY: Pacemaker, Anemia, atrial tach, a-flutter, chronic anticoagulation, CHF, HLD, HTN, osteoporosis, PD, a-fib, femur fx surgery 2011, L partial knee replacement 2018, wrist surgery  PAIN:  Are you having pain? No  PRECAUTIONS: Fall and ICD/Pacemaker  RED FLAGS: None   WEIGHT BEARING RESTRICTIONS: No  FALLS: Has patient fallen in last 6 months? Yes. Number of falls 3; reports 1 fall resulted in head trauma but "I had sunglasses on so it only hit the sunglasses."  LIVING ENVIRONMENT: Lives with: lives with their spouse Lives in: Other independent living at [place], getting ready to move in another home within [employer] Stairs: No Has following equipment at home: Single point cane and Tour manager  PLOF: Independent; likes reading, walking, and working puzzles  PATIENT GOALS: work on balance   OBJECTIVE:   TODAY'S TREATMENT: 12/29/22 Activity Comments  Gait training -trials with rollator. Inside/outside surfaces supervision. Discussion of benefits of 4WW vs cane  Sit to stand 1x10 -regular -5# front rack hold  Alt cone taps 1x10 Suitcase hold 5#  Standing on foam -feet apart eyes open/closed x 30 sec -feet together eyes open/closed x 30 sec -semi-tandem x 30 sec -feet apart head turns 3x eyes open/closed  Standing on slope 1x30 sec  Single leg stance 3x10 sec       PATIENT  EDUCATION: Education details: virtues of 4WW vs cane Person educated: Patient Education method: Explanation, Demonstration, Tactile cues, Verbal cues, and Handouts Education comprehension: verbalized understanding and returned demonstration   HOME EXERCISE PROGRAM:  Access Code: [email or web address] URL: [email or web address] Date: 12/10/2022 Prepared by: [facility] - Outpatient  Rehab - [facility]  Exercises - Sit to Stand Without Arm Support  - 1 x daily - 5 x weekly - 2 sets - 10 reps - Standing with Head Rotation  - 1 x daily - 5 x weekly - 2 sets - 30 sec hold - Romberg Stance with Head Nods  - 1 x daily - 5 x weekly - 2 sets - 30 sec hold - Corner Balance Feet Together With Eyes Closed  - 1 x daily - 7 x weekly - 3 sets - 30 sec hold - Standing Toe Taps  - 1 x daily - 5 x weekly - 2 sets - 10 reps - Gastroc Stretch on Wall  - 1 x daily - 5 x weekly - 2 sets - 30 sec hold - Standing Quarter Turn with Counter Support  - 1 x daily - 7 x weekly - 1 sets - 3-5 reps - Standing Single Leg Stance with Counter Support  - 1 x daily - 7 x weekly - 3 sets - 10 sec hold     Note: Objective measures were completed at Evaluation unless otherwise noted.  DIAGNOSTIC FINDINGS: none recent  COGNITION: Overall cognitive status: Within functional limits for tasks assessed   SENSATION: Reports baseline N/T in the L lower leg  COORDINATION: Alternating pronation/supination: WNL B Alternating toe tap: WNL B Finger to nose: WNL B   MUSCLE TONE: WNL B LEs   POSTURE: rounded shoulders, forward head, and head flexed slightly L; dyskinesias at rest  LOWER EXTREMITY ROM:     Active  Right Eval Left Eval  Hip flexion    Hip extension    Hip abduction    Hip adduction  Hip internal rotation    Hip external rotation    Knee flexion    Knee extension    Ankle dorsiflexion 5 18  Ankle plantarflexion    Ankle inversion    Ankle eversion     (Blank rows = not  tested)  LOWER EXTREMITY MMT:    MMT (in sitting) Right Eval Left Eval  Hip flexion 5 5  Hip extension    Hip abduction 4+ 4+  Hip adduction 4+ 4+  Hip internal rotation    Hip external rotation    Knee flexion 4+ 5  Knee extension 4+ 5  Ankle dorsiflexion 4+ 4+  Ankle plantarflexion 4+ 4+  Ankle inversion    Ankle eversion    (Blank rows = not tested)     GAIT: Gait pattern: limited heel-toe pattern, R LE scissoring occasionally; tendency to perform several quick steps ; occasional freezing  Assistive device utilized: None Level of assistance: SBA   FUNCTIONAL TESTS:    Completed 360 degree turn in 10 steps        GOALS: Goals reviewed with patient? Yes  SHORT TERM GOALS: Target date: 01/05/2023    Patient to be independent with initial HEP. Baseline: HEP initiated Goal status: NOT MET   > Demo improved postural stability per mild sway x 30 sec condition 4 M-CTSIB to improve safety with ADL   Baseline: moderate-severe   Goal status: INITIAL  LONG TERM GOALS: Target date: 01/26/2023    Patient to be independent with advanced HEP and to include HIIT Baseline: in development Goal status: IN PROGRESS  Patient to verbalize understanding of local Parkinson's Disease community resources including community fitness post D/C.   Baseline: in development  Goal status: IN PROGRESS  Patient to improve MiniBestTest score to atleast 17-21 to decrease risk of falls.  Baseline: 13/28; (12/22/22) 21/28 Goal status: MET  Patient to complete 360 degree turn in 8 steps or less.  Baseline: 10 steps; (12/22/22) 2-4 steps Goal status: MET  Patient to demonstrate 5xSTS test in <15 sec without posterior LOB in order to decrease risk of falls.   Baseline: 9.2 sec with posterior lean/LOB; (12/22/22) 12 sec Goal status: MET  Patient to verbalize tips to reduce freezing/festination with gait and turns. Baseline: in development Goal status: IN  PROGRESS   ASSESSMENT:  CLINICAL IMPRESSION: Session initiated with gait training using rollator walker and ambulation outdoors over various surfaces and w/c curb cutouts for challenge demonstrating markedly improved safety and stability with ambulation as in decreased ataxia from dyskinesia and ability to self-correct freezing episodes in a much more safe and effective manner.  Supervision needed for cues in position and proper sequence with brake function.  Continued with activities to improve postural stability under multisensory conditions to facilitate righting reactions to reduce risk for falls and improve awareness for limits of stability.  Trials of single limb stance with UE support to improve LE strength and single limb support balance.  Dyskinesia strongly manifests with this task and recommend continued home trials using counter top for support.  Provided education regarding HEP updates and rollator, verbalizes understanding. Continued sessions to progress POC details  OBJECTIVE IMPAIRMENTS: Abnormal gait, decreased balance, decreased ROM, decreased safety awareness, increased muscle spasms, impaired flexibility, and postural dysfunction.   ACTIVITY LIMITATIONS: carrying, lifting, bending, standing, squatting, stairs, transfers, bathing, toileting, dressing, reach over head, hygiene/grooming, and locomotion level  PARTICIPATION LIMITATIONS: meal prep, cleaning, laundry, driving, shopping, community activity, and church  PERSONAL FACTORS: Age, Past/current experiences,  Time since onset of injury/illness/exacerbation, and 3+ comorbidities: Pacemaker, Anemia, atrial tach, a-flutter, chronic anticoagulation, CHF, HLD, HTN, osteoporosis, PD, a-fib, femur fx surgery 2011, L partial knee replacement 2018, wrist surgery  are also affecting patient's functional outcome.   REHAB POTENTIAL: Good  CLINICAL DECISION MAKING: Evolving/moderate complexity  EVALUATION COMPLEXITY:  Moderate  PLAN:  PT FREQUENCY: 1-2x/week  PT DURATION: 4 weeks  PLANNED INTERVENTIONS: 97146- PT Re-evaluation, 97110-Therapeutic exercises, 97530- Therapeutic activity, 97112- Neuromuscular re-education, 97535- Self Care, [id number]- Manual therapy, [phone]- Gait training, [phone]- Canalith repositioning, [id number]- Aquatic Therapy, [id number]- Electrical stimulation (unattended), Patient/Family education, Balance training, Stair training, Taping, Dry Needling, Joint mobilization, Vestibular training, Cryotherapy, Moist heat, Therapeutic exercises, Therapeutic activity, Neuromuscular re-education, Gait training, and Self Care  PLAN FOR NEXT SESSION: NU-step HIIT for home performance     1:22 PM, 12/29/22 M. Shary Decamp, PT, DPT Physical Therapist- [place] Office Number: [phone]

## 2022-12-30 ENCOUNTER — Other Ambulatory Visit: Payer: Self-pay | Admitting: Neurology

## 2022-12-30 ENCOUNTER — Inpatient Hospital Stay (HOSPITAL_BASED_OUTPATIENT_CLINIC_OR_DEPARTMENT_OTHER): Payer: Medicare Other | Admitting: Hematology and Oncology

## 2022-12-30 ENCOUNTER — Inpatient Hospital Stay: Payer: Medicare Other | Attending: Hematology and Oncology

## 2022-12-30 ENCOUNTER — Telehealth: Payer: Self-pay | Admitting: Neurology

## 2022-12-30 VITALS — BP 126/69 | HR 90 | Temp 97.7°F | Resp 18 | Ht 61.0 in | Wt 126.2 lb

## 2022-12-30 DIAGNOSIS — G20A1 Parkinson's disease without dyskinesia, without mention of fluctuations: Secondary | ICD-10-CM | POA: Diagnosis not present

## 2022-12-30 DIAGNOSIS — Z7901 Long term (current) use of anticoagulants: Secondary | ICD-10-CM | POA: Insufficient documentation

## 2022-12-30 DIAGNOSIS — D649 Anemia, unspecified: Secondary | ICD-10-CM | POA: Insufficient documentation

## 2022-12-30 DIAGNOSIS — G20B2 Parkinson's disease with dyskinesia, with fluctuations: Secondary | ICD-10-CM

## 2022-12-30 DIAGNOSIS — Z79899 Other long term (current) drug therapy: Secondary | ICD-10-CM | POA: Insufficient documentation

## 2022-12-30 LAB — CBC WITH DIFFERENTIAL (CANCER CENTER ONLY)
Abs Immature Granulocytes: 0.02 10*3/uL (ref 0.00–0.07)
Basophils Absolute: 0 10*3/uL (ref 0.0–0.1)
Basophils Relative: 1 %
Eosinophils Absolute: 0.1 10*3/uL (ref 0.0–0.5)
Eosinophils Relative: 1 %
HCT: 33.3 % — ABNORMAL LOW (ref 36.0–46.0)
Hemoglobin: 10.1 g/dL — ABNORMAL LOW (ref 12.0–15.0)
Immature Granulocytes: 0 %
Lymphocytes Relative: 11 %
Lymphs Abs: 0.6 10*3/uL — ABNORMAL LOW (ref 0.7–4.0)
MCH: 25.3 pg — ABNORMAL LOW (ref 26.0–34.0)
MCHC: 30.3 g/dL (ref 30.0–36.0)
MCV: 83.3 fL (ref 80.0–100.0)
Monocytes Absolute: 0.6 10*3/uL (ref 0.1–1.0)
Monocytes Relative: 10 %
Neutro Abs: 4.5 10*3/uL (ref 1.7–7.7)
Neutrophils Relative %: 77 %
Platelet Count: 208 10*3/uL (ref 150–400)
RBC: 4 MIL/uL (ref 3.87–5.11)
RDW: 22.4 % — ABNORMAL HIGH (ref 11.5–15.5)
WBC Count: 5.8 10*3/uL (ref 4.0–10.5)
nRBC: 0 % (ref 0.0–0.2)

## 2022-12-30 MED ORDER — AMBULATORY NON FORMULARY MEDICATION: 0 refills | Status: AC

## 2022-12-30 NOTE — Therapy (Signed)
OUTPATIENT PHYSICAL THERAPY NEURO TREATMENT   Patient Name: [name] [name] MRN: [id number] DOB:18-May-1940, 82 y.o., female Today's Date: 01/05/2023   PCP: Rodrigo Ran, MD  REFERRING PROVIDER: Tat, Octaviano Batty, DO     END OF SESSION:  PT End of Session - 01/05/23 1144     Visit Number 9    Number of Visits 15    Date for PT Re-Evaluation 01/26/23    Authorization Type Medicare    PT Start Time 1111   pt late   PT Stop Time 1144    PT Time Calculation (min) 33 min    Equipment Utilized During Treatment Gait belt    Activity Tolerance Patient tolerated treatment well;Patient limited by fatigue    Behavior During Therapy WFL for tasks assessed/performed                 Past Medical History:  Diagnosis Date   Anemia    Arthritis    thumbs   Atrial tachycardia (HCC)    Atypical atrial flutter (HCC)    Cataract    bilateral   Chronic anticoagulation    on Xarelto   Chronic diastolic CHF (congestive heart failure) (HCC)    Echocardiogram (11/24/12): Mild LVH, EF 60%.   Dysrhythmia    GERD (gastroesophageal reflux disease)    hx of no meds   Heart murmur    slight   Hyperlipidemia    a. Hx of leg weakness while on statin. under control   Hypertension    Hypothyroidism    Leg fracture Dec 21,2011   Osteoporosis 07/2007   Parkinson's disease (HCC) 2011   Persistent atrial fibrillation (HCC)    s/p atrial fib ablation 11-11-11.    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HCC)    Squamous carcinoma summer 2015   back of left thigh   Wrist fracture    right   Past Surgical History:  Procedure Laterality Date   ATRIAL FIBRILLATION ABLATION N/A 11/11/2011   Procedure: ATRIAL FIBRILLATION ABLATION;  Surgeon: Hillis Range, MD;  Location: [facility] CATH LAB;  Service: Cardiovascular;  Laterality: N/A;    BREAST BIOPSY  1998   BREAST SURGERY     benign cyst removed   CARDIAC ELECTROPHYSIOLOGY STUDY AND ABLATION  5/14, 7/14   convergent AF ablation at [facility] and subsequent atypical atrial flutter ablation by Dr Hurman Horn   CARDIOVASCULAR STRESS TEST  06-06-2008   EF 73%   CARDIOVERSION  09/09/2011   Procedure: CARDIOVERSION;  Surgeon: Cassell Clement, MD;  Location: [facility] ENDOSCOPY;  Service: Cardiovascular;  Laterality: N/A;  to be done by dr. Patty Sermons   CARDIOVERSION  02/18/2012   Procedure: CARDIOVERSION;  Surgeon: Vesta Mixer, MD;  Location: [facility] ENDOSCOPY;  Service: Cardiovascular;  Laterality: N/A;   CHOLECYSTECTOMY  1995   FEMUR FRACTURE SURGERY  2011   metal pin in place   FOREARM SURGERY Left 2010   "opened arm to drain it"   INSERTION OF MESH N/A 11/24/2013   Procedure: INSERTION OF MESH;  Surgeon: Karie Soda, MD;  Location: [facility] ORS;  Service: General;  Laterality: N/A;   IR THORACENTESIS ASP PLEURAL SPACE W/IMG GUIDE  02/25/2022   KNEE SURGERY Left 2001   Left unicompartmental knee     Dr. Charlann Boxer 11/18/16   PACEMAKER PLACEMENT  10/20/12  MDT Advisa DR implanted by Dr Hurman Horn at [facility]    PARTIAL KNEE ARTHROPLASTY Left 11/18/2016   Procedure: LEFT UNICOMPARTMENTAL KNEE Medially;  Surgeon: Durene Romans, MD;  Location: [facility] ORS;  Service: Orthopedics;  Laterality: Left;  90 mins   PPM GENERATOR CHANGEOUT N/A 07/17/2021   Procedure: PPM GENERATOR CHANGEOUT;  Surgeon: Regan Lemming, MD;  Location: [facility] INVASIVE CV LAB;  Service: Cardiovascular;  Laterality: N/A;   RIGHT HEART CATH N/A 06/13/2021   Procedure: RIGHT HEART CATH;  Surgeon: Laurey Morale, MD;  Location: [facility] INVASIVE CV LAB;  Service: Cardiovascular;  Laterality: N/A;   RIGHT HEART CATH N/A 03/06/2022   Procedure: RIGHT HEART CATH;  Surgeon: Laurey Morale, MD;  Location: [facility] INVASIVE CV LAB;  Service: Cardiovascular;  Laterality: N/A;   SKIN CANCER DESTRUCTION  summer 2015   TEE WITHOUT CARDIOVERSION  11/10/2011   Procedure:  TRANSESOPHAGEAL ECHOCARDIOGRAM (TEE);  Surgeon: Vesta Mixer, MD;  Location: [facility] ENDOSCOPY;  Service: Cardiovascular;  Laterality: N/A;   TEE WITHOUT CARDIOVERSION N/A 04/23/2022   Procedure: TRANSESOPHAGEAL ECHOCARDIOGRAM (TEE);  Surgeon: Laurey Morale, MD;  Location: [facility] ENDOSCOPY;  Service: Cardiovascular;  Laterality: N/A;   TONSILLECTOMY  as child   US ECHOCARDIOGRAPHY  03-10-2008   Est EF 55-60%, Dr. 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 05/08/2021   Aortic atherosclerosis (HCC) 12/10/2020   Normocytic anemia 07/09/2018   Cirrhosis (HCC) 11/09/2017   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/18/2016   Bilateral impacted cerumen 02/21/2016   Presbycusis of both ears 02/21/2016   Atypical atrial flutter (HCC) 05/28/2015   Diastolic dysfunction 06/13/2014   Ventral incisional hernia - epigastric 07/21/2013   Long term (current) use of anticoagulants 07/21/2013   Chronic diastolic heart failure (HCC) 12/01/2012   Paralysis agitans (HCC) 10/26/2012   Pacemaker 10/20/2012   Sick sinus syndrome (HCC) 10/20/2012   GERD (gastroesophageal reflux disease) 06/09/2012   Hyperlipidemia 06/09/2012   Parkinson's disease (HCC) 06/09/2012   Osteoporosis 06/09/2012   Hypertension 10/12/2011   Chest pain 06/03/2011   Atrial fibrillation (HCC) 10/31/2010    ONSET DATE: 10+ years   REFERRING DIAG: G20.B2 (ICD-10-CM) - Parkinson's  disease with dyskinesia and fluctuating manifestations (HCC)  THERAPY DIAG:  Other abnormalities of gait and mobility  Unsteadiness on feet  Rationale for Evaluation and Treatment: Rehabilitation  SUBJECTIVE:  SUBJECTIVE STATEMENT: "I way overslept today." Got a walker but have not put it together yet.   Pt accompanied by: self  PERTINENT HISTORY: Pacemaker, Anemia, atrial tach, a-flutter, chronic anticoagulation, CHF, HLD, HTN, osteoporosis, PD, a-fib, femur fx surgery 2011, L partial knee replacement 2018, wrist surgery  PAIN:  Are you having pain? No  PRECAUTIONS: Fall and ICD/Pacemaker  RED FLAGS: None   WEIGHT BEARING RESTRICTIONS: No  FALLS: Has patient fallen in last 6 months? Yes. Number of falls 3; reports 1 fall resulted in head trauma but "I had sunglasses on so it only hit the sunglasses."  LIVING ENVIRONMENT: Lives with: lives with their spouse Lives in: Other independent living at [place], getting ready to move in another home within [employer] Stairs: No Has following equipment at home: Single point cane and Shower bench  PLOF: Independent; likes reading, walking, and working puzzles  PATIENT GOALS: work on balance   OBJECTIVE:     TODAY'S TREATMENT: 01/05/23 Activity Comments  Nustep HIIT to be able to perform at home 2 min warm up L1 30 sec L6 30 sec L5 1 min L2 1 min L6 2 min L1 Cueing to try to maintain at least 80 SPM throughout; above 100 SPM at easier levels   93% spO2, 80bpm    in II bars: step ups 8" 2x10  backwards walking  1/2 turns in II bars, then with alt toe tap on cone standing PWR moves in II bars Up 10x Rock 10x Twist 10x Step 10x  L step ups more challenging (pt reports a bad knee). Requires cues to look down and place feet safely. A  few episodes requiring min A d/t misstep.   With PWR moves: great amplitude and coordination of movement. Minor cues required to reset at midline and reduce pace      HOME EXERCISE PROGRAM:  Access Code: [email or web address] URL: [email or web address] Date: 12/10/2022 Prepared by: [facility] - Outpatient  Rehab - [facility]  Exercises - Sit to Stand Without Arm Support  - 1 x daily - 5 x weekly - 2 sets - 10 reps - Standing with Head Rotation  - 1 x daily - 5 x weekly - 2 sets - 30 sec hold - Romberg Stance with Head Nods  - 1 x daily - 5 x weekly - 2 sets - 30 sec hold - Corner Balance Feet Together With Eyes Closed  - 1 x daily - 7 x weekly - 3 sets - 30 sec hold - Standing Toe Taps  - 1 x daily - 5 x weekly - 2 sets - 10 reps - Gastroc Stretch on Wall  - 1 x daily - 5 x weekly - 2 sets - 30 sec hold - Standing Quarter Turn with Counter Support  - 1 x daily - 7 x weekly - 1 sets - 3-5 reps - Standing Single Leg Stance with Counter Support  - 1 x daily - 7 x weekly - 3 sets - 10 sec hold     Note: Objective measures were completed at Evaluation unless otherwise noted.  DIAGNOSTIC FINDINGS: none recent  COGNITION: Overall cognitive status: Within functional limits for tasks assessed   SENSATION: Reports baseline N/T in the L lower leg  COORDINATION: Alternating pronation/supination: WNL B Alternating toe tap: WNL B Finger to nose: WNL B   MUSCLE TONE: WNL B LEs   POSTURE: rounded shoulders, forward head, and head flexed slightly L; dyskinesias at rest  LOWER EXTREMITY ROM:  Active  Right Eval Left Eval  Hip flexion    Hip extension    Hip abduction    Hip adduction    Hip internal rotation    Hip external rotation    Knee flexion    Knee extension    Ankle dorsiflexion 5 18  Ankle plantarflexion    Ankle inversion    Ankle eversion     (Blank rows = not tested)  LOWER EXTREMITY MMT:    MMT (in sitting) Right Eval Left Eval  Hip flexion  5 5  Hip extension    Hip abduction 4+ 4+  Hip adduction 4+ 4+  Hip internal rotation    Hip external rotation    Knee flexion 4+ 5  Knee extension 4+ 5  Ankle dorsiflexion 4+ 4+  Ankle plantarflexion 4+ 4+  Ankle inversion    Ankle eversion    (Blank rows = not tested)     GAIT: Gait pattern: limited heel-toe pattern, R LE scissoring occasionally; tendency to perform several quick steps ; occasional freezing  Assistive device utilized: None Level of assistance: SBA   FUNCTIONAL TESTS:    Completed 360 degree turn in 10 steps        GOALS: Goals reviewed with patient? Yes  SHORT TERM GOALS: Target date: 01/05/2023    Patient to be independent with initial HEP. Baseline: HEP initiated Goal status: NOT MET   > Demo improved postural stability per mild sway x 30 sec condition 4 M-CTSIB to improve safety with ADL   Baseline: moderate-severe   Goal status: INITIAL  LONG TERM GOALS: Target date: 01/26/2023    Patient to be independent with advanced HEP and to include HIIT Baseline: in development Goal status: IN PROGRESS  Patient to verbalize understanding of local Parkinson's Disease community resources including community fitness post D/C.   Baseline: in development  Goal status: IN PROGRESS  Patient to improve MiniBestTest score to atleast 17-21 to decrease risk of falls.  Baseline: 13/28; (12/22/22) 21/28 Goal status: MET  Patient to complete 360 degree turn in 8 steps or less.  Baseline: 10 steps; (12/22/22) 2-4 steps Goal status: MET  Patient to demonstrate 5xSTS test in <15 sec without posterior LOB in order to decrease risk of falls.   Baseline: 9.2 sec with posterior lean/LOB; (12/22/22) 12 sec Goal status: MET  Patient to verbalize tips to reduce freezing/festination with gait and turns. Baseline: in development Goal status: IN PROGRESS   ASSESSMENT:  CLINICAL IMPRESSION: Patient arrived to session without new complaints. Patient  reports that she has not tried Nustep at home yet; trialed HIIT intervals today with cueing to maintain quick pace throughout. Patient tolerated this and vitals responded appropriated but reported fatigue prematurely. Standing activities focused on turns, step ups, and large amplitude training. Patient caught onto [facility] moves well; only minor cues required for pacing. May benefit from PWR moves classes at [place]. Patient without complaints at end of session.   OBJECTIVE IMPAIRMENTS: Abnormal gait, decreased balance, decreased ROM, decreased safety awareness, increased muscle spasms, impaired flexibility, and postural dysfunction.   ACTIVITY LIMITATIONS: carrying, lifting, bending, standing, squatting, stairs, transfers, bathing, toileting, dressing, reach over head, hygiene/grooming, and locomotion level  PARTICIPATION LIMITATIONS: meal prep, cleaning, laundry, driving, shopping, community activity, and church  PERSONAL FACTORS: Age, Past/current experiences, Time since onset of injury/illness/exacerbation, and 3+ comorbidities: Pacemaker, Anemia, atrial tach, a-flutter, chronic anticoagulation, CHF, HLD, HTN, osteoporosis, PD, a-fib, femur fx surgery 2011, L partial knee replacement 2018, wrist surgery  are also affecting patient's functional outcome.   REHAB POTENTIAL: Good  CLINICAL DECISION MAKING: Evolving/moderate complexity  EVALUATION COMPLEXITY: Moderate  PLAN:  PT FREQUENCY: 1-2x/week  PT DURATION: 4 weeks  PLANNED INTERVENTIONS: 97146- PT Re-evaluation, 97110-Therapeutic exercises, 97530- Therapeutic activity, 97112- Neuromuscular re-education, 97535- Self Care, 78295- Manual therapy, [phone]- Gait training, [phone]- Canalith repositioning, [id number]- Aquatic Therapy, 97014- Electrical stimulation (unattended), Patient/Family education, Balance training, Stair training, Taping, Dry Needling, Joint mobilization, Vestibular training, Cryotherapy, Moist heat, Therapeutic exercises, Therapeutic  activity, Neuromuscular re-education, Gait training, and Self Care  PLAN FOR NEXT SESSION: PN; discuss PWR moves classes? NU-step HIIT for home performance    Anette Guarneri, , DPT 01/05/23 11:47 AM  [facility] Re[facility] at [facility] [address] [place], [address] [place], [place] [address] Phone # [phone] Fax # [phone]

## 2022-12-30 NOTE — Progress Notes (Signed)
Patient Care Team: Rodrigo Ran, MD as PCP - General (Internal Medicine) Nahser, Deloris Ping, MD as PCP - Cardiology (Cardiology) Regan Lemming, MD as PCP - Electrophysiology (Cardiology) Elenor Legato, MD as Referring Physician (Cardiothoracic Surgery) Charlette Caffey, MD as Consulting Physician (Internal Medicine) Tat, Octaviano Batty, DO as Consulting Physician (Neurology)  DIAGNOSIS:  Encounter Diagnosis  Name Primary?   Normocytic anemia Yes    CHIEF COMPLIANT: Follow-up of anemia  HISTORY OF PRESENT ILLNESS:  History of Present Illness   The patient, with a history of anemia, heart disease, and Parkinson's disease, presents for a follow-up visit after receiving an iron infusion. The patient reports some improvement in energy levels after IV Iron. She has not identified any specific cause for her anemia, but notes that her energy levels have improved since receiving the iron infusion. The patient also mentions that she has been seeing multiple doctors for her various health conditions.  The patient's anemia has been a persistent issue, with her hemoglobin levels fluctuating between eight and nine over the past five months. However, recent lab results show an improvement, with the hemoglobin level now at ten point one. The patient attributes this improvement to the iron infusion she received, although she only received one dose instead of the usual two. The patient is open to receiving a second iron infusion to complete the treatment protocol.  In addition to her anemia, the patient also discusses her other health conditions.. The patient also has Parkinson's disease, which she believes contributes to her lack of energy. She is currently taking Eliquis for her heart condition, but the dosage was recently reduced due to her age and weight. The patient wonders if this reduction in dosage could be contributing to her improved hemoglobin levels, as Eliquis can cause bleeding.          ALLERGIES:  is allergic to amiodarone and statins.  MEDICATIONS:  Current Outpatient Medications  Medication Sig Dispense Refill   acetaminophen (TYLENOL) 500 MG tablet Take 1,000 mg by mouth at bedtime as needed for mild pain, moderate pain or headache.     Acetylcysteine (NAC 600) 600 MG CAPS Take 600 mg by mouth in the morning.     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 60 tablet 5   B-D 3CC LUER-LOK SYR 25GX1" 25G X 1" 3 ML MISC Inject 1 mL into the muscle once a week.     BELSOMRA 20 MG TABS Take 20 mg by mouth at bedtime as needed (sleep).     Calcium Citrate-Vitamin D (CITRACAL + D PO) Take 1 tablet by mouth every evening.     carbidopa-levodopa (SINEMET IR) 25-100 MG tablet TAKE TWO TABLETS BY MOUTH THREE TIMES DAILY 540 tablet 0   Carbidopa-Levodopa ER (RYTARY) 36.25-145 MG CPCR Take 2 tablets by mouth at bedtime. 180 capsule 0   Carbidopa-Levodopa ER 23.75-95 MG CPCR Take by mouth.     cholecalciferol (VITAMIN D3) 25 MCG (1000 UNIT) tablet Take 1,000 Units by mouth in the morning.     cyanocobalamin (,VITAMIN B-12,) 1000 MCG/ML injection INJECT INTRAMUSCULARLY IN THE DELTOID ONCE A MONTH (ALTERNATING DELT. EACH MONTH)     Cyanocobalamin (VITAMIN B12) 1000 MCG TBCR Take 1,000 mcg by mouth in the morning.     docusate sodium (COLACE) 100 MG capsule Take 100 mg by mouth 2 (two)  times daily.     GLUTATHIONE PO Take 2 capsules by mouth in the morning.     levothyroxine (SYNTHROID) 50 MCG tablet Take 50-100 mcg by mouth See admin instructions. Take 2 tablets (100 mcg) by mouth on Mondays and Thursdays in the morning. Take 1 tablet (50 mcg) by mouth on Sundays, Tuesdays, Wednesdays, Fridays & Saturdays.     magnesium oxide (MAG-OX) 400 MG tablet Take 400 mg by mouth at bedtime.     metoprolol succinate (TOPROL-XL) 25 MG 24 hr tablet TAKE ONE TABLET BY MOUTH  EVERYDAY AT BEDTIME 90 tablet 2   multivitamin-lutein (OCUVITE-LUTEIN) CAPS capsule Take 1 capsule by mouth in the morning.     NON FORMULARY Take 1 tablet by mouth at bedtime. MetaCalm Stress Regulation Formula     polycarbophil (FIBERCON) 625 MG tablet Take 1,250 mg by mouth in the morning.     potassium chloride (MICRO-K) 10 MEQ CR capsule TAKE 6 CAPSULES EVERY MORNING AND AT NOON 360 capsule 3   Probiotic Product (PROBIOTIC PO) Take 1 capsule by mouth every evening.     rasagiline (AZILECT) 1 MG TABS tablet TAKE ONE TABLET BY MOUTH ONCE DAILY 90 tablet 0   spironolactone (ALDACTONE) 25 MG tablet Take 2 tablets (50 mg total) by mouth daily. 180 tablet 3   torsemide (DEMADEX) 20 MG tablet Take 4 tablets (80 mg total) by mouth every morning AND 3 tablets (60 mg total) every evening. 300 tablet 3   valACYclovir (VALTREX) 1000 MG tablet Take 1,000 mg by mouth 2 (two) times daily as needed (fever blisters).     vitamin C (ASCORBIC ACID) 500 MG tablet Take 500 mg by mouth in the morning.     No current facility-administered medications for this visit.   Facility-Administered Medications Ordered in Other Visits  Medication Dose Route Frequency Provider Last Rate Last Admin   sodium chloride flush (NS) 0.9 % injection 10 mL  10 mL Intravenous Q12H Ralene Muskrat, PA-C        PHYSICAL EXAMINATION: ECOG PERFORMANCE STATUS: 1 - Symptomatic but completely ambulatory  Vitals:   12/30/22 1343  BP: 126/69  Pulse: 90  Resp: 18  Temp: 97.7 F (36.5 C)  SpO2: 100%   Filed Weights   12/30/22 1343  Weight: 126 lb 3.2 oz (57.2 kg)     LABORATORY DATA:  I have reviewed the data as listed    Latest Ref Rng & Units 11/26/2022   10:12 AM 11/06/2022   11:10 AM 07/16/2022    2:15 PM  CMP  Glucose 70 - 99 mg/dL 89  147  80   BUN 8 - 23 mg/dL 21  24  25    Creatinine 0.44 - 1.00 mg/dL 8.29  5.62  1.30   Sodium 135 - 145 mmol/L 131  132  138   Potassium 3.5 - 5.1 mmol/L 4.6  4.3  4.0   Chloride  98 - 111 mmol/L 95  95  98   CO2 22 - 32 mmol/L 29  27  29    Calcium 8.9 - 10.3 mg/dL 9.6  9.2  9.3     Lab Results  Component Value Date   WBC 5.8 12/30/2022   HGB 10.1 (L) 12/30/2022   HCT 33.3 (L) 12/30/2022   MCV 83.3 12/30/2022   PLT 208 12/30/2022   NEUTROABS 4.5 12/30/2022    ASSESSMENT & PLAN:  Normocytic anemia Lab review: 06/11/2021: Hemoglobin 10.3, MCV 88.2 02/21/2022: Hemoglobin 8.3, MCV 101, ferritin 88, creatinine 1.08,  iron saturation 13%, TIBC 406 03/06/2022: Hemoglobin 8.7, MCV 96, RDW 15.9 04/21/2022: Hemoglobin 10.8, MCV 89.3 05/27/2022: Hemoglobin 10.5 12/17/2022: Hemoglobin 9.1, MCV 78.5 (liver biopsy: Cirrhosis)   Cord stem cell infusion November 2023   Work-up: SPEP: No M Protein LDH 232 (was 535) Folate: 15.9, TSH 4.1, DAT: Neg, Retic 2.3%, Imm Retic Frac: 24.8, BR 1.5, B 12: 690, EPO: 39.2, Hb 9.5, Cr 0.85        Anemia Chronic anemia with recent improvement in hemoglobin to 10.1 after iron infusion. Possible causes include malabsorption of iron due to age or occult bleeding. Recent reduction in Eliquis dose may also have contributed to improvement. -Complete iron infusion protocol with a second dose. -Check labs monthly for the next three months to monitor hemoglobin levels. -Consider TAG RBC scan if hemoglobin levels drop significantly, pending discussion with gastroenterologist.  Anticoagulation Eliquis dose recently reduced due to age and weight considerations. No reported adverse effects. -Continue Eliquis at reduced dose. -Monitor for signs of bleeding or clotting.  Follow-up in three months or sooner if hemoglobin levels drop significantly.          Orders Placed This Encounter  Procedures   CBC with Differential (Cancer Center Only)    Standing Status:   Standing    Number of Occurrences:   20    Standing Expiration Date:   12/30/2023   Ferritin    Standing Status:   Standing    Number of Occurrences:   20    Standing Expiration  Date:   12/30/2023   Iron and Iron Binding Capacity ([facility] only)    Standing Status:   Standing    Number of Occurrences:   20    Standing Expiration Date:   12/30/2023   The patient has a good understanding of the overall plan. she agrees with it. she will call with any problems that may develop before the next visit here. Total time spent: 30 mins including face to face time and time spent for planning, charting and co-ordination of care   Tamsen Meek, MD 12/30/22

## 2022-12-30 NOTE — Telephone Encounter (Signed)
Patient wants to see if Dr Tat will call her in a Rollator to [employer]

## 2022-12-30 NOTE — Telephone Encounter (Signed)
Talked to patient about getting a RX to send to [facility] medical supplies for this patient

## 2022-12-30 NOTE — Assessment & Plan Note (Signed)
Lab review: 06/11/2021: Hemoglobin 10.3, MCV 88.2 02/21/2022: Hemoglobin 8.3, MCV 101, ferritin 88, creatinine 1.08, iron saturation 13%, TIBC 406 03/06/2022: Hemoglobin 8.7, MCV 96, RDW 15.9 04/21/2022: Hemoglobin 10.8, MCV 89.3 05/27/2022: Hemoglobin 10.5 12/17/2022: Hemoglobin 9.1, MCV 78.5 (liver biopsy: Cirrhosis)   Cord stem cell infusion November 2023   Work-up: SPEP: No M Protein LDH 232 (was 535) Folate: 15.9, TSH 4.1, DAT: Neg, Retic 2.3%, Imm Retic Frac: 24.8, BR 1.5, B 12: 690, EPO: 39.2, Hb 9.5, Cr 0.85   No clear cut etiology. Recommend: We would like to check iron studies and give IV iron if she is deficient

## 2023-01-05 ENCOUNTER — Ambulatory Visit: Payer: Medicare Other | Attending: Neurology | Admitting: Physical Therapy

## 2023-01-05 ENCOUNTER — Encounter: Payer: Self-pay | Admitting: Physical Therapy

## 2023-01-05 DIAGNOSIS — H43812 Vitreous degeneration, left eye: Secondary | ICD-10-CM | POA: Diagnosis not present

## 2023-01-05 DIAGNOSIS — R2681 Unsteadiness on feet: Secondary | ICD-10-CM | POA: Diagnosis not present

## 2023-01-05 DIAGNOSIS — H4052X3 Glaucoma secondary to other eye disorders, left eye, severe stage: Secondary | ICD-10-CM | POA: Diagnosis not present

## 2023-01-05 DIAGNOSIS — R2689 Other abnormalities of gait and mobility: Secondary | ICD-10-CM | POA: Insufficient documentation

## 2023-01-05 DIAGNOSIS — H35721 Serous detachment of retinal pigment epithelium, right eye: Secondary | ICD-10-CM | POA: Diagnosis not present

## 2023-01-05 DIAGNOSIS — H353211 Exudative age-related macular degeneration, right eye, with active choroidal neovascularization: Secondary | ICD-10-CM | POA: Diagnosis not present

## 2023-01-05 DIAGNOSIS — H353221 Exudative age-related macular degeneration, left eye, with active choroidal neovascularization: Secondary | ICD-10-CM | POA: Diagnosis not present

## 2023-01-05 DIAGNOSIS — H353132 Nonexudative age-related macular degeneration, bilateral, intermediate dry stage: Secondary | ICD-10-CM | POA: Diagnosis not present

## 2023-01-06 ENCOUNTER — Other Ambulatory Visit (HOSPITAL_COMMUNITY): Payer: Self-pay | Admitting: Nurse Practitioner

## 2023-01-06 ENCOUNTER — Other Ambulatory Visit: Payer: Medicare Other

## 2023-01-06 ENCOUNTER — Ambulatory Visit: Payer: Medicare Other | Attending: Cardiology | Admitting: Cardiology

## 2023-01-06 ENCOUNTER — Encounter: Payer: Self-pay | Admitting: Cardiology

## 2023-01-06 VITALS — BP 110/74 | HR 74 | Ht 61.0 in | Wt 128.0 lb

## 2023-01-06 DIAGNOSIS — I4811 Longstanding persistent atrial fibrillation: Secondary | ICD-10-CM | POA: Diagnosis not present

## 2023-01-06 DIAGNOSIS — I10 Essential (primary) hypertension: Secondary | ICD-10-CM | POA: Insufficient documentation

## 2023-01-06 DIAGNOSIS — I495 Sick sinus syndrome: Secondary | ICD-10-CM | POA: Diagnosis not present

## 2023-01-06 DIAGNOSIS — D6869 Other thrombophilia: Secondary | ICD-10-CM | POA: Insufficient documentation

## 2023-01-06 DIAGNOSIS — D376 Neoplasm of uncertain behavior of liver, gallbladder and bile ducts: Secondary | ICD-10-CM

## 2023-01-06 LAB — CUP PACEART INCLINIC DEVICE CHECK
Date Time Interrogation Session: 20241203165511
Implantable Lead Connection Status: 753985
Implantable Lead Connection Status: 753985
Implantable Lead Implant Date: 20140917
Implantable Lead Implant Date: 20140917
Implantable Lead Location: 753859
Implantable Lead Location: 753860
Implantable Pulse Generator Implant Date: 20230614

## 2023-01-06 NOTE — Progress Notes (Signed)
  Electrophysiology Office Note:   Date:  01/06/2023  ID:  [name] [name], DOB 1941/01/01, MRN [id number]  Primary Cardiologist: [name] Miss, MD Electrophysiologist: [name] Lemming, MD      History of Present Illness:   [name] [name] is a 82 y.o. female with h/o hypertension, hyperlipidemia, Parkinson's disease, atypical atrial flutter, atrial fibrillation, tachybradycardia syndrome, pulmonary hypertension, cirrhosis, diastolic heart failure seen today for routine electrophysiology followup.   Since last being seen in our clinic the patient reports doing overall well.  She has no awareness of arrhythmia.  She had, 1 week ago, an episode of chest discomfort that occurred at 4 in the morning.  It woke her from sleep and was in the center of her chest.  Not associated with palpitations.  It improved, and she was able to sleep.  She has also had 4 episodes of pain in her left shoulder that have lasted a few seconds at a time.  Pain feels like a shocking sensation.  she denies palpitations, dyspnea, PND, orthopnea, nausea, vomiting, dizziness, syncope, edema, weight gain, or early satiety.   Review of systems complete and found to be negative unless listed in HPI.      EP Information / Studies Reviewed:    EKG is not ordered today. EKG from 11/26/22 reviewed which showed AV paced      PPM Interrogation-  reviewed in detail today,  See PACEART report.  Device History: Medtronic Dual Chamber PPM implanted 07/17/2021 for Tachy-Brady syndrome  Risk Assessment/Calculations:    CHA2DS2-VASc Score = 6   This indicates a 9.7% annual risk of stroke. The patient's score is based upon: CHF History: 1 HTN History: 1 Diabetes History: 0 Stroke History: 0 Vascular Disease History: 1 Age Score: 2 Gender Score: 1             Physical Exam:   VS:  BP 110/74 (BP Location: Right Arm, Patient Position: Sitting, Cuff Size: Normal)   Pulse 74   Ht 5\' 1"  (1.549 m)   Wt 128 lb  (58.1 kg)   LMP 02/04/1996   SpO2 98%   BMI 24.19 kg/m    Wt Readings from Last 3 Encounters:  01/06/23 128 lb (58.1 kg)  12/30/22 126 lb 3.2 oz (57.2 kg)  12/19/22 125 lb (56.7 kg)     GEN: Well nourished, well developed in no acute distress NECK: No JVD; No carotid bruits CARDIAC: Regular rate and rhythm, no murmurs, rubs, gallops RESPIRATORY:  Clear to auscultation without rales, wheezing or rhonchi  ABDOMEN: Soft, non-tender, non-distended EXTREMITIES:  No edema; No deformity   ASSESSMENT AND PLAN:    Tachy-Brady syndrome s/p Medtronic PPM  Normal PPM function See Pace Art report No changes today  2.  Longstanding persistent atrial fibrillation: Has had Endo and epicardial ablations in the past.  Currently in sinus rhythm with a 2.3% burden.  [name] [name] continue with current management.  3.  Chronic diastolic heart failure: No obvious volume overload  4.  Secondary hypercoagulable state: Currently on Eliquis for atrial fibrillation  5.  Hypertension: Currently well-controlled  6.  Chest pain: Pain appears somewhat atypical.  She has had 2 different types of pain, 1 in the center of her chest, and another in her left shoulder.  There is no indication of device malfunction.  She [name] [name] call the clinic back if they recur.  Disposition:   Follow up with EP APP in 12 months  Signed, [name] Kinslow Jorja Loa, MD

## 2023-01-07 ENCOUNTER — Ambulatory Visit: Payer: Medicare Other | Admitting: Physical Therapy

## 2023-01-07 ENCOUNTER — Encounter: Payer: Self-pay | Admitting: Physical Therapy

## 2023-01-07 DIAGNOSIS — R2681 Unsteadiness on feet: Secondary | ICD-10-CM | POA: Diagnosis not present

## 2023-01-07 DIAGNOSIS — R2689 Other abnormalities of gait and mobility: Secondary | ICD-10-CM

## 2023-01-07 NOTE — Therapy (Signed)
OUTPATIENT PHYSICAL THERAPY NEURO TREATMENT/PROGRESS NOTE   Patient Name: [name] [name] MRN: [id number] DOB:[birth year], 82 y.o., female Today's Date: 01/07/2023   PCP: Rodrigo Ran, MD  REFERRING PROVIDER: Vladimir Faster, DO    Progress Note Reporting Period 11/13/2022 to 01/07/2023  See note below for Objective Data and Assessment of Progress/Goals.     END OF SESSION:  PT End of Session - 01/07/23 1446     Visit Number 10    Number of Visits 15    Date for PT Re-Evaluation 01/26/23    Authorization Type Medicare    PT Start Time 1447    PT Stop Time 1530    PT Time Calculation (min) 43 min    Equipment Utilized During Treatment Gait belt    Activity Tolerance Patient tolerated treatment well    Behavior During Therapy WFL for tasks assessed/performed                  Past Medical History:  Diagnosis Date   Anemia    Arthritis    thumbs   Atrial tachycardia (HCC)    Atypical atrial flutter (HCC)    Cataract    bilateral   Chronic anticoagulation    on Xarelto   Chronic diastolic CHF (congestive heart failure) (HCC)    Echocardiogram (11/24/12): Mild LVH, EF 60%.   Dysrhythmia    GERD (gastroesophageal reflux disease)    hx of no meds   Heart murmur    slight   Hyperlipidemia    a. Hx of leg weakness while on statin. under control   Hypertension    Hypothyroidism    Leg fracture Dec 21,2011   Osteoporosis 07/2007   Parkinson's disease (HCC) 2011   Persistent atrial fibrillation (HCC)    s/p atrial fib ablation 11-11-11.    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HCC)    Squamous carcinoma summer 2015   back of left thigh   Wrist fracture    right   Past Surgical History:  Procedure Laterality Date   ATRIAL FIBRILLATION ABLATION N/A 11/11/2011   Procedure: ATRIAL  FIBRILLATION ABLATION;  Surgeon: Hillis Range, MD;  Location: [facility] CATH LAB;  Service: Cardiovascular;  Laterality: N/A;   BREAST BIOPSY  1998   BREAST SURGERY     benign cyst removed   CARDIAC ELECTROPHYSIOLOGY STUDY AND ABLATION  5/14, 7/14   convergent AF ablation at [facility] and subsequent atypical atrial flutter ablation by Dr Hurman Horn   CARDIOVASCULAR STRESS TEST  06-06-2008   EF 73%   CARDIOVERSION  09/09/2011   Procedure: CARDIOVERSION;  Surgeon: Cassell Clement, MD;  Location: [facility] ENDOSCOPY;  Service: Cardiovascular;  Laterality: N/A;  to be done by dr. Patty Sermons   CARDIOVERSION  02/18/2012   Procedure: CARDIOVERSION;  Surgeon: Vesta Mixer, MD;  Location: [facility] ENDOSCOPY;  Service: Cardiovascular;  Laterality: N/A;   CHOLECYSTECTOMY  1995   FEMUR FRACTURE SURGERY  2011   metal pin in place   FOREARM SURGERY Left 2010   "opened arm to drain it"   INSERTION OF MESH N/A 11/24/2013   Procedure: INSERTION OF MESH;  Surgeon: Karie Soda, MD;  Location: [facility] ORS;  Service: General;  Laterality: N/A;   IR THORACENTESIS ASP PLEURAL SPACE W/IMG GUIDE  02/25/2022   KNEE SURGERY Left 2001   Left  unicompartmental knee     Dr. Charlann Boxer 11/18/16   PACEMAKER PLACEMENT  10/20/12   MDT Advisa DR implanted by Dr Hurman Horn at [facility]    PARTIAL KNEE ARTHROPLASTY Left 11/18/2016   Procedure: LEFT UNICOMPARTMENTAL KNEE Medially;  Surgeon: Durene Romans, MD;  Location: [facility] ORS;  Service: Orthopedics;  Laterality: Left;  90 mins   PPM GENERATOR CHANGEOUT N/A 07/17/2021   Procedure: PPM GENERATOR CHANGEOUT;  Surgeon: Regan Lemming, MD;  Location: [facility] INVASIVE CV LAB;  Service: Cardiovascular;  Laterality: N/A;   RIGHT HEART CATH N/A 06/13/2021   Procedure: RIGHT HEART CATH;  Surgeon: Laurey Morale, MD;  Location: [facility] INVASIVE CV LAB;  Service: Cardiovascular;  Laterality: N/A;   RIGHT HEART CATH N/A 03/06/2022   Procedure: RIGHT HEART CATH;  Surgeon: Laurey Morale, MD;  Location: [facility] INVASIVE CV LAB;  Service:  Cardiovascular;  Laterality: N/A;   SKIN CANCER DESTRUCTION  summer 2015   TEE WITHOUT CARDIOVERSION  11/10/2011   Procedure: TRANSESOPHAGEAL ECHOCARDIOGRAM (TEE);  Surgeon: Vesta Mixer, MD;  Location: [facility] ENDOSCOPY;  Service: Cardiovascular;  Laterality: N/A;   TEE WITHOUT CARDIOVERSION N/A 04/23/2022   Procedure: TRANSESOPHAGEAL ECHOCARDIOGRAM (TEE);  Surgeon: Laurey Morale, MD;  Location: [facility] ENDOSCOPY;  Service: Cardiovascular;  Laterality: N/A;   TONSILLECTOMY  as child   US ECHOCARDIOGRAPHY  03-10-2008   Est EF 55-60%, Dr. 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 05/08/2021   Aortic atherosclerosis (HCC) 12/10/2020   Normocytic anemia 07/09/2018   Cirrhosis (HCC) 11/09/2017   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/18/2016   Bilateral impacted cerumen 02/21/2016   Presbycusis of both ears 02/21/2016   Atypical atrial flutter (HCC) 05/28/2015   Diastolic dysfunction 06/13/2014   Ventral incisional hernia - epigastric 07/21/2013   Long term (current) use of anticoagulants 07/21/2013   Chronic diastolic heart failure (HCC) 12/01/2012   Paralysis agitans (HCC) 10/26/2012   Pacemaker 10/20/2012   Sick sinus syndrome (HCC) 10/20/2012   GERD (gastroesophageal reflux disease) 06/09/2012   Hyperlipidemia 06/09/2012   Parkinson's disease (HCC) 06/09/2012   Osteoporosis 06/09/2012   Hypertension 10/12/2011   Chest pain  06/03/2011   Atrial fibrillation (HCC) 10/31/2010    ONSET DATE: 10+ years   REFERRING DIAG: G20.B2 (ICD-10-CM) - Parkinson's disease with dyskinesia and fluctuating manifestations (HCC)  THERAPY DIAG:  Other abnormalities of gait and mobility  Unsteadiness on feet  Rationale for Evaluation and Treatment: Rehabilitation  SUBJECTIVE:  SUBJECTIVE STATEMENT: It's not a great day.  I think therapy has helped me realize I likely need a walker for outside surfaces; also therapy is giving more confidence.  Want to continue to work on my balance.  Pt accompanied by: self  PERTINENT HISTORY: Pacemaker, Anemia, atrial tach, a-flutter, chronic anticoagulation, CHF, HLD, HTN, osteoporosis, PD, a-fib, femur fx surgery 2011, L partial knee replacement 2018, wrist surgery  PAIN:  Are you having pain? No  PRECAUTIONS: Fall and ICD/Pacemaker  RED FLAGS: None   WEIGHT BEARING RESTRICTIONS: No  FALLS: Has patient fallen in last 6 months? Yes. Number of falls 3; reports 1 fall resulted in head trauma but "I had sunglasses on so it only hit the sunglasses."  LIVING ENVIRONMENT: Lives with: lives with their spouse Lives in: Other independent living at [place], getting ready to move in another home within [employer] Stairs: No Has following equipment at home: Single point cane and Shower bench  PLOF: Independent; likes reading, walking, and working puzzles  PATIENT GOALS: work on balance   OBJECTIVE:    TODAY'S TREATMENT: 01/07/2023 Activity Comments  FTSTS:  10.63 sec   TUG: 10 sec TUG cognitive:  12.25 sec TUG manual:9.13 sec   3 M backwards walk test:  5.35 sec   10 M walk:  10.16 sec (3.23 ft/sec)   standing PWR moves in II bars Up 10x Rock 10x Twist 10x Step 10x Good form, but cues to  hold at end ranges for optimal large amplitude of movements  -Forward/back stagger stance rock/step and then forward/back step -forward/back step to open bathroom doorway with supervision and then to step to/away from mat to pick up coat Mild LOB with larger steps, but no festination noted; min guard and cues provided   *Pt does note that she does better with testing*  PT does note pt coming into gym/bathroom area, that she has significant festination with maneuvering door into and out of bathroom.  She reports doorways and people being too close to her cause her to "tense up" and festinate.   M-CTSIB  Condition 1: Firm Surface, EO 30 Sec, Normal Sway  Condition 2: Firm Surface, EC 30 Sec, Mild Sway  Condition 3: Foam Surface, EO 30 Sec, Mild Sway  Condition 4: Foam Surface, EC 30 Sec, Moderate Sway    PATIENT EDUCATION: Education details: Rationale for PWR! Moves exercises and their relation to daily functional activities; provided handout for community PD exercise, with special attention to [facility]! Moves [place] Tuesday class information Person educated: Patient Education method: Explanation, Demonstration, Verbal cues, and Handouts Education comprehension: verbalized understanding, returned demonstration, verbal cues required, and needs further education   HOME EXERCISE PROGRAM:  Access Code: [email or web address] URL: [email or web address] Date: 12/10/2022 Prepared by: [facility]patient  Rehab - [facility]  Exercises - Sit to Stand Without Arm Support  - 1 x daily - 5 x weekly - 2 sets - 10 reps - Standing with Head Rotation  - 1 x daily - 5 x weekly - 2 sets - 30 sec hold - Romberg Stance with Head Nods  - 1 x daily - 5 x weekly - 2 sets - 30 sec hold - Corner Balance Feet Together With Eyes Closed  - 1 x daily - 7 x weekly - 3 sets - 30 sec hold - Standing Toe Taps  - 1 x daily - 5 x weekly - 2 sets - 10 reps - Gastroc Stretch on Wall  - 1 x daily - 5  x weekly - 2  sets - 30 sec hold - Standing Quarter Turn with Counter Support  - 1 x daily - 7 x weekly - 1 sets - 3-5 reps - Standing Single Leg Stance with Counter Support  - 1 x daily - 7 x weekly - 3 sets - 10 sec hold     Note: Objective measures were completed at Evaluation unless otherwise noted.  DIAGNOSTIC FINDINGS: none recent  COGNITION: Overall cognitive status: Within functional limits for tasks assessed   SENSATION: Reports baseline N/T in the L lower leg  COORDINATION: Alternating pronation/supination: WNL B Alternating toe tap: WNL B Finger to nose: WNL B   MUSCLE TONE: WNL B LEs   POSTURE: rounded shoulders, forward head, and head flexed slightly L; dyskinesias at rest  LOWER EXTREMITY ROM:     Active  Right Eval Left Eval  Hip flexion    Hip extension    Hip abduction    Hip adduction    Hip internal rotation    Hip external rotation    Knee flexion    Knee extension    Ankle dorsiflexion 5 18  Ankle plantarflexion    Ankle inversion    Ankle eversion     (Blank rows = not tested)  LOWER EXTREMITY MMT:    MMT (in sitting) Right Eval Left Eval  Hip flexion 5 5  Hip extension    Hip abduction 4+ 4+  Hip adduction 4+ 4+  Hip internal rotation    Hip external rotation    Knee flexion 4+ 5  Knee extension 4+ 5  Ankle dorsiflexion 4+ 4+  Ankle plantarflexion 4+ 4+  Ankle inversion    Ankle eversion    (Blank rows = not tested)     GAIT: Gait pattern: limited heel-toe pattern, R LE scissoring occasionally; tendency to perform several quick steps ; occasional freezing  Assistive device utilized: None Level of assistance: SBA   FUNCTIONAL TESTS:    Completed 360 degree turn in 10 steps        GOALS: Goals reviewed with patient? Yes  SHORT TERM GOALS: Target date: 01/05/2023    Patient to be independent with initial HEP. Baseline: HEP initiated Goal status: NOT MET   > Demo improved postural stability per mild sway x 30 sec  condition 4 M-CTSIB to improve safety with ADL   Baseline: moderate-severe   Goal status: INITIAL  LONG TERM GOALS: Target date: 01/26/2023    Patient to be independent with advanced HEP and to include HIIT Baseline: in development Goal status: IN PROGRESS  Patient to verbalize understanding of local Parkinson's Disease community resources including community fitness post D/C.   Baseline: in development  Goal status: IN PROGRESS  Patient to improve MiniBestTest score to atleast 17-21 to decrease risk of falls.  Baseline: 13/28; (12/22/22) 21/28 Goal status: MET  Patient to complete 360 degree turn in 8 steps or less.  Baseline: 10 steps; (12/22/22) 2-4 steps Goal status: MET  Patient to demonstrate 5xSTS test in <15 sec without posterior LOB in order to decrease risk of falls.   Baseline: 9.2 sec with posterior lean/LOB; (12/22/22) 12 sec Goal status: MET  Patient to verbalize tips to reduce freezing/festination with gait and turns. Baseline: in development Goal status: IN PROGRESS   ASSESSMENT:  CLINICAL IMPRESSION: 10th Visit Progress note:   PT reports she feels more confident, but still notes difficulty with her balance with doorways and in crowds. See objective measures above.  She  does well with gait velocity, TUG and FTSTS measures; however, with quick/unexpected changes of direction and doorway negotiation (such as the bathroom), she has increased festination.  Utilized large amplitude movement patterns with standing PWR! Moves; again, pt does nice job with these movement patterns today and PT explained their connection to daily activities.  At end of session, worked on large forward/back stepping, then progressed to Emergency planning/management officer with this technique, with pt having less festination.  She does have mild LOB with this and would need more practice for optimal carryover outside of clinic.  She will continue to benefit from skilled PT towards remaining LTGs to optimize  functional mobility, decrease festination with gait and turns and decrease fall risk.  OBJECTIVE IMPAIRMENTS: Abnormal gait, decreased balance, decreased ROM, decreased safety awareness, increased muscle spasms, impaired flexibility, and postural dysfunction.   ACTIVITY LIMITATIONS: carrying, lifting, bending, standing, squatting, stairs, transfers, bathing, toileting, dressing, reach over head, hygiene/grooming, and locomotion level  PARTICIPATION LIMITATIONS: meal prep, cleaning, laundry, driving, shopping, community activity, and church  PERSONAL FACTORS: Age, Past/current experiences, Time since onset of injury/illness/exacerbation, and 3+ comorbidities: Pacemaker, Anemia, atrial tach, a-flutter, chronic anticoagulation, CHF, HLD, HTN, osteoporosis, PD, a-fib, femur fx surgery 2011, L partial knee replacement 2018, wrist surgery  are also affecting patient's functional outcome.   REHAB POTENTIAL: Good  CLINICAL DECISION MAKING: Evolving/moderate complexity  EVALUATION COMPLEXITY: Moderate  PLAN:  PT FREQUENCY: 1-2x/week  PT DURATION: 4 weeks  PLANNED INTERVENTIONS: 97146- PT Re-evaluation, 97110-Therapeutic exercises, 97530- Therapeutic activity, 97112- Neuromuscular re-education, 97535- Self Care, 16109- Manual therapy, [id number]- Gait training, [id number]- Canalith repositioning, [id number]- Aquatic Therapy, 97014- Electrical stimulation (unattended), Patient/Family education, Balance training, Stair training, Taping, Dry Needling, Joint mobilization, Vestibular training, Cryotherapy, Moist heat, Therapeutic exercises, Therapeutic activity, Neuromuscular re-education, Gait training, and Self Care  PLAN FOR NEXT SESSION: Ask if pt looked into PWR moves classes at [facility]? NU-step HIIT for home performance, PWR! Moves in standing; continue to work on ant/posterior step and weightshift for doorway management    Lonia Blood, PT 01/07/23 5:19 PM Phone: [phone] Fax: [phone]  [facility]  [facility] Outpatient Rehab at [facility] Neuro [address], [address] [place], [place] [address] Phone # [phone] Fax # [phone]

## 2023-01-08 NOTE — Therapy (Incomplete)
OUTPATIENT PHYSICAL THERAPY NEURO TREATMENT NOTE   Patient Name: [name] [name] [name] MRN: [id number] DOB:[birth year], 82 y.o., female Today's Date: 01/08/2023   PCP: [name] Ran, MD  REFERRING PROVIDER: Tat, Octaviano Batty, DO      END OF SESSION:         Past Medical History:  Diagnosis Date   Anemia    Arthritis    thumbs   Atrial tachycardia (HCC)    Atypical atrial flutter (HCC)    Cataract    bilateral   Chronic anticoagulation    on Xarelto   Chronic diastolic CHF (congestive heart failure) (HCC)    Echocardiogram (11/24/12): Mild LVH, EF 60%.   Dysrhythmia    GERD (gastroesophageal reflux disease)    hx of no meds   Heart murmur    slight   Hyperlipidemia    a. Hx of leg weakness while on statin. under control   Hypertension    Hypothyroidism    Leg fracture Dec 21,2011   Osteoporosis 07/2007   Parkinson's disease (HCC) 2011   Persistent atrial fibrillation (HCC)    s/p atrial fib ablation 11-11-11.    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HCC)    Squamous carcinoma summer 2015   back of left thigh   Wrist fracture    right   Past Surgical History:  Procedure Laterality Date   ATRIAL FIBRILLATION ABLATION N/A 11/11/2011   Procedure: ATRIAL FIBRILLATION ABLATION;  Surgeon: [name] Range, MD;  Location: [facility] CATH LAB;  [name] [name]: Cardiovascular;  Laterality: N/A;   BREAST BIOPSY  1998   BREAST SURGERY     benign cyst removed   CARDIAC ELECTROPHYSIOLOGY STUDY AND ABLATION  5/14, 7/14   convergent AF ablation at [facility] and subsequent atypical atrial flutter ablation by Dr [name] [name] [name]   CARDIOVASCULAR STRESS TEST  06-06-2008   EF 73%   CARDIOVERSION  09/09/2011   Procedure: CARDIOVERSION;  Surgeon: [name] Clement, MD;  Location: [facility] ENDOSCOPY;  [name] [name]: Cardiovascular;  Laterality: N/A;  to be done by dr.  Patty [name] [name]   CARDIOVERSION  02/18/2012   Procedure: CARDIOVERSION;  Surgeon: [name] Mixer, MD;  Location: [facility] ENDOSCOPY;  [name] [name]: Cardiovascular;  Laterality: N/A;   CHOLECYSTECTOMY  1995   FEMUR FRACTURE SURGERY  2011   metal pin in place   FOREARM SURGERY Left 2010   "opened arm to drain it"   INSERTION OF MESH N/A 11/24/2013   Procedure: INSERTION OF MESH;  Surgeon: [name] Soda, MD;  Location: [facility] ORS;  [name] [name]: General;  Laterality: N/A;   IR THORACENTESIS ASP PLEURAL SPACE W/IMG GUIDE  02/25/2022   KNEE SURGERY Left 2001   Left unicompartmental knee     [name] [name] [name] 11/18/16   PACEMAKER PLACEMENT  10/20/12   MDT Advisa DR implanted by Dr [name] [name] [name] [facility]    PARTIAL KNEE ARTHROPLASTY Left 11/18/2016   Procedure: LEFT UNICOMPARTMENTAL KNEE Medially;  Surgeon: [name] Romans, MD;  Location: [facility] ORS;  [name] [name]: Orthopedics;  Laterality: Left;  90 mins   PPM GENERATOR CHANGEOUT N/A 07/17/2021   Procedure: PPM GENERATOR CHANGEOUT;  Surgeon: [name] Lemming, MD;  Location: [facility] INVASIVE CV LAB;  [name] [name]: Cardiovascular;  Laterality: N/A;   RIGHT HEART CATH N/A 06/13/2021   Procedure: RIGHT HEART CATH;  Surgeon: [name] Morale, MD;  Location: [facility] INVASIVE CV LAB;  [name] [name]: Cardiovascular;  Laterality: N/A;   RIGHT HEART CATH N/A 03/06/2022   Procedure: RIGHT HEART CATH;  Surgeon: [name] Morale, MD;  Location: [facility] INVASIVE CV LAB;  [name] [name]: Cardiovascular;  Laterality: N/A;   SKIN CANCER DESTRUCTION  summer 2015   TEE WITHOUT CARDIOVERSION  11/10/2011   Procedure: TRANSESOPHAGEAL ECHOCARDIOGRAM (TEE);  Surgeon: [name] Mixer, MD;  Location: [name] ENDOSCOPY;  [name] [name]: Cardiovascular;  Laterality: N/A;   TEE WITHOUT CARDIOVERSION N/A 04/23/2022   Procedure: TRANSESOPHAGEAL ECHOCARDIOGRAM (TEE);  Surgeon: [name] Morale, MD;  Location: [facility] ENDOSCOPY;  [name] [name]: Cardiovascular;  Laterality: N/A;   TONSILLECTOMY  as child   US ECHOCARDIOGRAPHY  03-10-2008   Est EF 55-60%, Dr. 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 05/08/2021   Aortic atherosclerosis (HCC) 12/10/2020   Normocytic anemia 07/09/2018   Cirrhosis (HCC) 11/09/2017   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/18/2016   Bilateral impacted cerumen 02/21/2016   Presbycusis of both ears 02/21/2016   Atypical atrial flutter (HCC) 05/28/2015   Diastolic dysfunction 06/13/2014   Ventral incisional hernia - epigastric 07/21/2013   Long term (current) use of anticoagulants 07/21/2013   Chronic diastolic heart failure (HCC) 12/01/2012   Paralysis agitans (HCC) 10/26/2012   Pacemaker 10/20/2012   Sick sinus syndrome (HCC) 10/20/2012   GERD (gastroesophageal reflux disease) 06/09/2012   Hyperlipidemia 06/09/2012   Parkinson's disease (HCC) 06/09/2012   Osteoporosis 06/09/2012   Hypertension 10/12/2011   Chest pain 06/03/2011   Atrial fibrillation (HCC) 10/31/2010    ONSET DATE: 10+ years   REFERRING DIAG: G20.B2 (ICD-10-CM) - Parkinson's disease with dyskinesia and fluctuating manifestations (HCC)  THERAPY DIAG:  No diagnosis found.  Rationale for Evaluation and Treatment: Rehabilitation  SUBJECTIVE:                                                                                                                                                                                             SUBJECTIVE STATEMENT: It's not a great day.  I think therapy has helped me realize I  likely need a walker for outside surfaces; also  therapy is giving more confidence.  Want to continue to work on my balance.  Pt accompanied by: self  PERTINENT HISTORY: Pacemaker, Anemia, atrial tach, a-flutter, chronic anticoagulation, CHF, HLD, HTN, osteoporosis, PD, a-fib, femur fx surgery 2011, L partial knee replacement 2018, wrist surgery  PAIN:  Are you having pain? No  PRECAUTIONS: Fall and ICD/Pacemaker  RED FLAGS: None   WEIGHT BEARING RESTRICTIONS: No  FALLS: Has patient fallen in last 6 months? Yes. Number of falls 3; reports 1 fall resulted in head trauma but "I had sunglasses on so it only hit the sunglasses."  LIVING ENVIRONMENT: Lives with: lives with their spouse Lives in: Other independent living at [place], getting ready to move in another home within [place] Stairs: No Has following equipment at home: Single point cane and Shower bench  PLOF: Independent; likes reading, walking, and working puzzles  PATIENT GOALS: work on balance   OBJECTIVE:     TODAY'S TREATMENT: 01/12/23 Activity Comments                        HOME EXERCISE PROGRAM:  Access Code: [email or web address] URL: [email or web address] Date: 12/10/2022 Prepared by: [facility] - Outpatient  Rehab - [facility]  Exercises - Sit to Stand Without Arm Support  - 1 x daily - 5 x weekly - 2 sets - 10 reps - Standing with Head Rotation  - 1 x daily - 5 x weekly - 2 sets - 30 sec hold - Romberg Stance with Head Nods  - 1 x daily - 5 x weekly - 2 sets - 30 sec hold - Corner Balance Feet Together With Eyes Closed  - 1 x daily - 7 x weekly - 3 sets - 30 sec hold - Standing Toe Taps  - 1 x daily - 5 x weekly - 2 sets - 10 reps - Gastroc Stretch on Wall  - 1 x daily - 5 x weekly - 2 sets - 30 sec hold - Standing Quarter Turn with Counter Support  - 1 x daily - 7 x weekly - 1 sets - 3-5 reps - Standing Single Leg Stance with Counter Support  - 1 x daily - 7 x weekly - 3 sets - 10  sec hold     Note: Objective measures were completed at Evaluation unless otherwise noted.  DIAGNOSTIC FINDINGS: none recent  COGNITION: Overall cognitive status: Within functional limits for tasks assessed   SENSATION: Reports baseline N/T in the L lower leg  COORDINATION: Alternating pronation/supination: WNL B Alternating toe tap: WNL B Finger to nose: WNL B   MUSCLE TONE: WNL B LEs   POSTURE: rounded shoulders, forward head, and head flexed slightly L; dyskinesias at rest  LOWER EXTREMITY ROM:     Active  Right Eval Left Eval  Hip flexion    Hip extension    Hip abduction    Hip adduction    Hip internal rotation    Hip external rotation    Knee flexion    Knee extension    Ankle dorsiflexion 5 18  Ankle plantarflexion    Ankle inversion    Ankle eversion     (Blank rows = not tested)  LOWER EXTREMITY MMT:    MMT (in sitting) Right Eval Left Eval  Hip flexion 5 5  Hip extension    Hip abduction 4+ 4+  Hip adduction 4+ 4+  Hip internal rotation    Hip external rotation    Knee flexion  4+ 5  Knee extension 4+ 5  Ankle dorsiflexion 4+ 4+  Ankle plantarflexion 4+ 4+  Ankle inversion    Ankle eversion    (Blank rows = not tested)     GAIT: Gait pattern: limited heel-toe pattern, R LE scissoring occasionally; tendency to perform several quick steps ; occasional freezing  Assistive device utilized: None Level of assistance: SBA   FUNCTIONAL TESTS:    Completed 360 degree turn in 10 steps        GOALS: Goals reviewed with patient? Yes  SHORT TERM GOALS: Target date: 01/05/2023    Patient to be independent with initial HEP. Baseline: HEP initiated Goal status: NOT MET   > Demo improved postural stability per mild sway x 30 sec condition 4 M-CTSIB to improve safety with ADL   Baseline: moderate-severe   Goal status: INITIAL  LONG TERM GOALS: Target date: 01/26/2023    Patient to be independent with advanced HEP and to  include HIIT Baseline: in development Goal status: IN PROGRESS  Patient to verbalize understanding of local Parkinson's Disease community resources including community fitness post D/C.   Baseline: in development  Goal status: IN PROGRESS  Patient to improve MiniBestTest score to atleast 17-21 to decrease risk of falls.  Baseline: 13/28; (12/22/22) 21/28 Goal status: MET  Patient to complete 360 degree turn in 8 steps or less.  Baseline: 10 steps; (12/22/22) 2-4 steps Goal status: MET  Patient to demonstrate 5xSTS test in <15 sec without posterior LOB in order to decrease risk of falls.   Baseline: 9.2 sec with posterior lean/LOB; (12/22/22) 12 sec Goal status: MET  Patient to verbalize tips to reduce freezing/festination with gait and turns. Baseline: in development Goal status: IN PROGRESS   ASSESSMENT:  CLINICAL IMPRESSION: 10th Visit Progress note:   PT reports she feels more confident, but still notes difficulty with her balance with doorways and in crowds. See objective measures above.  She does well with gait velocity, TUG and FTSTS measures; however, with quick/unexpected changes of direction and doorway negotiation (such as the bathroom), she has increased festination.  Utilized large amplitude movement patterns with standing PWR! Moves; again, pt does nice job with these movement patterns today and PT explained their connection to daily activities.  At end of session, worked on large forward/back stepping, then progressed to Emergency planning/management officer with this technique, with pt having less festination.  She does have mild LOB with this and would need more practice for optimal carryover outside of clinic.  She will continue to benefit from skilled PT towards remaining LTGs to optimize functional mobility, decrease festination with gait and turns and decrease fall risk.  OBJECTIVE IMPAIRMENTS: Abnormal gait, decreased balance, decreased ROM, decreased safety awareness, increased  muscle spasms, impaired flexibility, and postural dysfunction.   ACTIVITY LIMITATIONS: carrying, lifting, bending, standing, squatting, stairs, transfers, bathing, toileting, dressing, reach over head, hygiene/grooming, and locomotion level  PARTICIPATION LIMITATIONS: meal prep, cleaning, laundry, driving, shopping, community activity, and church  PERSONAL FACTORS: Age, Past/current experiences, Time since onset of injury/illness/exacerbation, and 3+ comorbidities: Pacemaker, Anemia, atrial tach, a-flutter, chronic anticoagulation, CHF, HLD, HTN, osteoporosis, PD, a-fib, femur fx surgery 2011, L partial knee replacement 2018, wrist surgery  are also affecting patient's functional outcome.   REHAB POTENTIAL: Good  CLINICAL DECISION MAKING: Evolving/moderate complexity  EVALUATION COMPLEXITY: Moderate  PLAN:  PT FREQUENCY: 1-2x/week  PT DURATION: 4 weeks  PLANNED INTERVENTIONS: 97146- PT Re-evaluation, 97110-Therapeutic exercises, 97530- Therapeutic activity, [id number]- Neuromuscular re-education, 97535- Self Care, [id number]- Manual  therapy, [phone]- Gait training, 60454- Canalith repositioning, 09811- Aquatic Therapy, [phone]- Electrical stimulation (unattended), Patient/Family education, Balance training, Stair training, Taping, Dry Needling, Joint mobilization, Vestibular training, Cryotherapy, Moist heat, Therapeutic exercises, Therapeutic activity, Neuromuscular re-education, Gait training, and Self Care  PLAN FOR NEXT SESSION: Ask if pt looked into PWR moves classes at [facility]? NU-step HIIT for home performance, PWR! Moves in standing; continue to work on ant/posterior step and weightshift for doorway management

## 2023-01-12 ENCOUNTER — Encounter (HOSPITAL_COMMUNITY)
Admission: RE | Admit: 2023-01-12 | Discharge: 2023-01-12 | Disposition: A | Discharge status: Home / Self Care | Payer: Medicare Other | Source: Ambulatory Visit | Attending: Nurse Practitioner | Admitting: Nurse Practitioner

## 2023-01-12 ENCOUNTER — Ambulatory Visit: Payer: Medicare Other | Admitting: Physical Therapy

## 2023-01-12 DIAGNOSIS — R911 Solitary pulmonary nodule: Secondary | ICD-10-CM | POA: Diagnosis not present

## 2023-01-12 DIAGNOSIS — D376 Neoplasm of uncertain behavior of liver, gallbladder and bile ducts: Secondary | ICD-10-CM | POA: Diagnosis not present

## 2023-01-12 DIAGNOSIS — R932 Abnormal findings on diagnostic imaging of liver and biliary tract: Secondary | ICD-10-CM | POA: Diagnosis not present

## 2023-01-12 LAB — GLUCOSE, CAPILLARY: Glucose-Capillary: 120 mg/dL — ABNORMAL HIGH (ref 70–99)

## 2023-01-12 MED ORDER — FLUDEOXYGLUCOSE F - 18 (FDG) INJECTION
6.4000 | Freq: Once | INTRAVENOUS | Status: AC
  Administered 2023-01-12: 6.4 via INTRAVENOUS

## 2023-01-13 ENCOUNTER — Inpatient Hospital Stay: Payer: Medicare Other | Attending: Hematology and Oncology

## 2023-01-13 VITALS — BP 112/69 | HR 85 | Temp 98.5°F | Resp 22

## 2023-01-13 DIAGNOSIS — D649 Anemia, unspecified: Secondary | ICD-10-CM | POA: Insufficient documentation

## 2023-01-13 MED ORDER — SODIUM CHLORIDE 0.9% FLUSH: 10.0000 mL | Freq: Two times a day (BID) | INTRAVENOUS | Status: DC

## 2023-01-13 MED ORDER — SODIUM CHLORIDE 0.9 % IV SOLN
510.0000 mg | Freq: Once | INTRAVENOUS | Status: AC
  Administered 2023-01-13: 510 mg via INTRAVENOUS
  Filled 2023-01-13: qty 510

## 2023-01-13 MED ORDER — SODIUM CHLORIDE 0.9 % IV SOLN: INTRAVENOUS | Status: DC

## 2023-01-13 NOTE — Progress Notes (Signed)
Patient declined post iron infusion observation.  Tolerated treatment well without incident.  VSS at discharge.  Ambulated to lobby.

## 2023-01-13 NOTE — Patient Instructions (Signed)
 Ferumoxytol Injection What is this medication? FERUMOXYTOL (FER ue MOX i tol) treats low levels of iron in your body (iron deficiency anemia). Iron is a mineral that plays an important role in making red blood cells, which carry oxygen from your lungs to the rest of your body. This medicine may be used for other purposes; ask your health care provider or pharmacist if you have questions. COMMON BRAND NAME(S): Feraheme What should I tell my care team before I take this medication? They need to know if you have any of these conditions: Anemia not caused by low iron levels High levels of iron in the blood Magnetic resonance imaging (MRI) test scheduled An unusual or allergic reaction to iron, other medications, foods, dyes, or preservatives Pregnant or trying to get pregnant Breastfeeding How should I use this medication? This medication is injected into a vein. It is given by your care team in a hospital or clinic setting. Talk to your care team the use of this medication in children. Special care may be needed. Overdosage: If you think you have taken too much of this medicine contact a poison control center or emergency room at once. NOTE: This medicine is only for you. Do not share this medicine with others. What if I miss a dose? It is important not to miss your dose. Call your care team if you are unable to keep an appointment. What may interact with this medication? Other iron products This list may not describe all possible interactions. Give your health care provider a list of all the medicines, herbs, non-prescription drugs, or dietary supplements you use. Also tell them if you smoke, drink alcohol, or use illegal drugs. Some items may interact with your medicine. What should I watch for while using this medication? Visit your care team regularly. Tell your care team if your symptoms do not start to get better or if they get worse. You may need blood work done while you are taking this  medication. You may need to follow a special diet. Talk to your care team. Foods that contain iron include: whole grains/cereals, dried fruits, beans, or peas, leafy green vegetables, and organ meats (liver, kidney). What side effects may I notice from receiving this medication? Side effects that you should report to your care team as soon as possible: Allergic reactions--skin rash, itching, hives, swelling of the face, lips, tongue, or throat Low blood pressure--dizziness, feeling faint or lightheaded, blurry vision Shortness of breath Side effects that usually do not require medical attention (report to your care team if they continue or are bothersome): Flushing Headache Joint pain Muscle pain Nausea Pain, redness, or irritation at injection site This list may not describe all possible side effects. Call your doctor for medical advice about side effects. You may report side effects to FDA at 1-800-FDA-1088. Where should I keep my medication? This medication is given in a hospital or clinic. It will not be stored at home. NOTE: This sheet is a summary. It may not cover all possible information. If you have questions about this medicine, talk to your doctor, pharmacist, or health care provider.  2024 Elsevier/Gold Standard (2022-06-27 00:00:00)

## 2023-01-13 NOTE — Therapy (Signed)
OUTPATIENT PHYSICAL THERAPY NEURO TREATMENT NOTE   Patient Name: [name] [name] MRN: [id number] DOB:Mar 17, 1940, 82 y.o., female Today's Date: 01/14/2023   PCP: Rodrigo Ran, MD  REFERRING PROVIDER: Tat, Octaviano Batty, DO      END OF SESSION:  PT End of Session - 01/14/23 1656     Visit Number 11    Number of Visits 15    Date for PT Re-Evaluation 01/26/23    Authorization Type Medicare    PT Start Time 1625   pt late   PT Stop Time 1656    PT Time Calculation (min) 31 min    Equipment Utilized During Treatment Gait belt    Activity Tolerance Patient tolerated treatment well    Behavior During Therapy WFL for tasks assessed/performed                   Past Medical History:  Diagnosis Date   Anemia    Arthritis    thumbs   Atrial tachycardia (HCC)    Atypical atrial flutter (HCC)    Cataract    bilateral   Chronic anticoagulation    on Xarelto   Chronic diastolic CHF (congestive heart failure) (HCC)    Echocardiogram (11/24/12): Mild LVH, EF 60%.   Dysrhythmia    GERD (gastroesophageal reflux disease)    hx of no meds   Heart murmur    slight   Hyperlipidemia    a. Hx of leg weakness while on statin. under control   Hypertension    Hypothyroidism    Leg fracture Dec 21,2011   Osteoporosis 07/2007   Parkinson's disease (HCC) 2011   Persistent atrial fibrillation (HCC)    s/p atrial fib ablation 11-11-11.    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HCC)    Squamous carcinoma summer 2015   back of left thigh   Wrist fracture    right   Past Surgical History:  Procedure Laterality Date   ATRIAL FIBRILLATION ABLATION N/A 11/11/2011   Procedure: ATRIAL FIBRILLATION ABLATION;  Surgeon: Hillis Range, MD;  Location: [facility] CATH LAB;  Service: Cardiovascular;  Laterality: N/A;   BREAST BIOPSY   1998   BREAST SURGERY     benign cyst removed   CARDIAC ELECTROPHYSIOLOGY STUDY AND ABLATION  5/14, 7/14   convergent AF ablation at [facility] and subsequent atypical atrial flutter ablation by Dr Hurman Horn   CARDIOVASCULAR STRESS TEST  06-06-2008   EF 73%   CARDIOVERSION  09/09/2011   Procedure: CARDIOVERSION;  Surgeon: Cassell Clement, MD;  Location: [facility] ENDOSCOPY;  Service: Cardiovascular;  Laterality: N/A;  to be done by dr. Patty Sermons   CARDIOVERSION  02/18/2012   Procedure: CARDIOVERSION;  Surgeon: Vesta Mixer, MD;  Location: [facility] ENDOSCOPY;  Service: Cardiovascular;  Laterality: N/A;   CHOLECYSTECTOMY  1995   FEMUR FRACTURE SURGERY  2011   metal pin in place   FOREARM SURGERY Left 2010   "opened arm to drain it"   INSERTION OF MESH N/A 11/24/2013   Procedure: INSERTION OF MESH;  Surgeon: Karie Soda, MD;  Location: [facility] ORS;  Service: General;  Laterality: N/A;   IR THORACENTESIS ASP PLEURAL SPACE W/IMG GUIDE  02/25/2022   KNEE SURGERY Left 2001   Left unicompartmental knee     Dr. Charlann Boxer 11/18/16   PACEMAKER PLACEMENT  10/20/12  MDT Advisa DR implanted by Dr Hurman Horn at [facility]    PARTIAL KNEE ARTHROPLASTY Left 11/18/2016   Procedure: LEFT UNICOMPARTMENTAL KNEE Medially;  Surgeon: Durene Romans, MD;  Location: [facility] ORS;  Service: Orthopedics;  Laterality: Left;  90 mins   PPM GENERATOR CHANGEOUT N/A 07/17/2021   Procedure: PPM GENERATOR CHANGEOUT;  Surgeon: Regan Lemming, MD;  Location: [facility] INVASIVE CV LAB;  Service: Cardiovascular;  Laterality: N/A;   RIGHT HEART CATH N/A 06/13/2021   Procedure: RIGHT HEART CATH;  Surgeon: Laurey Morale, MD;  Location: [facility] INVASIVE CV LAB;  Service: Cardiovascular;  Laterality: N/A;   RIGHT HEART CATH N/A 03/06/2022   Procedure: RIGHT HEART CATH;  Surgeon: Laurey Morale, MD;  Location: [facility] INVASIVE CV LAB;  Service: Cardiovascular;  Laterality: N/A;   SKIN CANCER DESTRUCTION  summer 2015   TEE WITHOUT CARDIOVERSION  11/10/2011   Procedure: TRANSESOPHAGEAL  ECHOCARDIOGRAM (TEE);  Surgeon: Vesta Mixer, MD;  Location: [facility] ENDOSCOPY;  Service: Cardiovascular;  Laterality: N/A;   TEE WITHOUT CARDIOVERSION N/A 04/23/2022   Procedure: TRANSESOPHAGEAL ECHOCARDIOGRAM (TEE);  Surgeon: Laurey Morale, MD;  Location: [facility] ENDOSCOPY;  Service: Cardiovascular;  Laterality: N/A;   TONSILLECTOMY  as child   US ECHOCARDIOGRAPHY  03-10-2008   Est EF 55-60%, Dr. 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 05/08/2021   Aortic atherosclerosis (HCC) 12/10/2020   Normocytic anemia 07/09/2018   Cirrhosis (HCC) 11/09/2017   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/18/2016   Bilateral impacted cerumen 02/21/2016   Presbycusis of both ears 02/21/2016   Atypical atrial flutter (HCC) 05/28/2015   Diastolic dysfunction 06/13/2014   Ventral incisional hernia - epigastric 07/21/2013   Long term (current) use of anticoagulants 07/21/2013   Chronic diastolic heart failure (HCC) 12/01/2012   Paralysis agitans (HCC) 10/26/2012   Pacemaker 10/20/2012   Sick sinus syndrome (HCC) 10/20/2012   GERD (gastroesophageal reflux disease) 06/09/2012   Hyperlipidemia 06/09/2012   Parkinson's disease (HCC) 06/09/2012   Osteoporosis 06/09/2012   Hypertension 10/12/2011   Chest pain 06/03/2011   Atrial fibrillation (HCC) 10/31/2010    ONSET DATE: 10+ years   REFERRING DIAG: G20.B2 (ICD-10-CM) - Parkinson's disease with  dyskinesia and fluctuating manifestations (HCC)  THERAPY DIAG:  Other abnormalities of gait and mobility  Unsteadiness on feet  Rationale for Evaluation and Treatment: Rehabilitation  SUBJECTIVE:  SUBJECTIVE STATEMENT: "I've been to a party about every night this week, it's about to do me in." Asking what type of MD she should see for nocturnal cramps.   Pt accompanied by: self  PERTINENT HISTORY: Pacemaker, Anemia, atrial tach, a-flutter, chronic anticoagulation, CHF, HLD, HTN, osteoporosis, PD, a-fib, femur fx surgery 2011, L partial knee replacement 2018, wrist surgery  PAIN:  Are you having pain? No  PRECAUTIONS: Fall and ICD/Pacemaker  RED FLAGS: None   WEIGHT BEARING RESTRICTIONS: No  FALLS: Has patient fallen in last 6 months? Yes. Number of falls 3; reports 1 fall resulted in head trauma but "I had sunglasses on so it only hit the sunglasses."  LIVING ENVIRONMENT: Lives with: lives with their spouse Lives in: Other independent living at [place], getting ready to move in another home within [place] Stairs: No Has following equipment at home: Single point cane and Shower bench  PLOF: Independent; likes reading, walking, and working puzzles  PATIENT GOALS: work on balance   OBJECTIVE:     TODAY'S TREATMENT: 01/14/23 Activity Comments  gait training through doorways with cues for wt shifting through freezing episodes  Freezing most evident when stopping, then initiating first step. Responded to weight to wt shift before taking 1st step   walking in "S" pattern around therapy poles Less freezing when taking wider turns around obstacles; when recalling the plot of a story, pt also freezes more  standing PWR moves with B therapy poles  In front of mirrors. Excellent coordination  and ability to follow along    PATIENT EDUCATION: Education details: encouraged pt to try a PWR moves class Person educated: Patient Education method: Explanation Education comprehension: verbalized understanding    HOME EXERCISE PROGRAM:  Access Code: [email or web address] URL: [email or web address] Date: 12/10/2022 Prepared by: [facility] Outpatient  Rehab - [facility]  Exercises - Sit to Stand Without Arm Support  - 1 x daily - 5 x weekly - 2 sets - 10 reps - Standing with Head Rotation  - 1 x daily - 5 x weekly - 2 sets - 30 sec hold - Romberg Stance with Head Nods  - 1 x daily - 5 x weekly - 2 sets - 30 sec hold - Corner Balance Feet Together With Eyes Closed  - 1 x daily - 7 x weekly - 3 sets - 30 sec hold - Standing Toe Taps  - 1 x daily - 5 x weekly - 2 sets - 10 reps - Gastroc Stretch on Wall  - 1 x daily - 5 x weekly - 2 sets - 30 sec hold - Standing Quarter Turn with Counter Support  - 1 x daily - 7 x weekly - 1 sets - 3-5 reps - Standing Single Leg Stance with Counter Support  - 1 x daily - 7 x weekly - 3 sets - 10 sec hold     Note: Objective measures were completed at Evaluation unless otherwise noted.  DIAGNOSTIC FINDINGS: none recent  COGNITION: Overall cognitive status: Within functional limits for tasks assessed   SENSATION: Reports baseline N/T in the L lower leg  COORDINATION: Alternating pronation/supination: WNL B Alternating toe tap: WNL B Finger to nose: WNL B   MUSCLE TONE: WNL B LEs   POSTURE: rounded shoulders, forward head, and head flexed slightly L; dyskinesias at rest  LOWER EXTREMITY ROM:     Active  Right Eval Left Eval  Hip flexion    Hip extension    Hip abduction  Hip adduction    Hip internal rotation    Hip external rotation    Knee flexion    Knee extension    Ankle dorsiflexion 5 18  Ankle plantarflexion    Ankle inversion    Ankle eversion     (Blank rows = not tested)  LOWER EXTREMITY MMT:     MMT (in sitting) Right Eval Left Eval  Hip flexion 5 5  Hip extension    Hip abduction 4+ 4+  Hip adduction 4+ 4+  Hip internal rotation    Hip external rotation    Knee flexion 4+ 5  Knee extension 4+ 5  Ankle dorsiflexion 4+ 4+  Ankle plantarflexion 4+ 4+  Ankle inversion    Ankle eversion    (Blank rows = not tested)     GAIT: Gait pattern: limited heel-toe pattern, R LE scissoring occasionally; tendency to perform several quick steps ; occasional freezing  Assistive device utilized: None Level of assistance: SBA   FUNCTIONAL TESTS:    Completed 360 degree turn in 10 steps        GOALS: Goals reviewed with patient? Yes  SHORT TERM GOALS: Target date: 01/05/2023    Patient to be independent with initial HEP. Baseline: HEP initiated Goal status: NOT MET   > Demo improved postural stability per mild sway x 30 sec condition 4 M-CTSIB to improve safety with ADL   Baseline: moderate-severe   Goal status: INITIAL  LONG TERM GOALS: Target date: 01/26/2023    Patient to be independent with advanced HEP and to include HIIT Baseline: in development Goal status: IN PROGRESS  Patient to verbalize understanding of local Parkinson's Disease community resources including community fitness post D/C.   Baseline: in development  Goal status: IN PROGRESS  Patient to improve MiniBestTest score to atleast 17-21 to decrease risk of falls.  Baseline: 13/28; (12/22/22) 21/28 Goal status: MET  Patient to complete 360 degree turn in 8 steps or less.  Baseline: 10 steps; (12/22/22) 2-4 steps Goal status: MET  Patient to demonstrate 5xSTS test in <15 sec without posterior LOB in order to decrease risk of falls.   Baseline: 9.2 sec with posterior lean/LOB; (12/22/22) 12 sec Goal status: MET  Patient to verbalize tips to reduce freezing/festination with gait and turns. Baseline: in development Goal status: IN PROGRESS   ASSESSMENT:  CLINICAL  IMPRESSION: Patient arrived to session with questions about nocturnal cramps- advised her to speak to her PCP about this. Worked on utilizing strategies to address freezing episodes. This was most prominent when initiating 1st step after stopping and with addition of dual tasking. Patient able to follow along with PWR moves very well today and would do well in a PWR moves class. Patient apprehensive and undecided at this time.   OBJECTIVE IMPAIRMENTS: Abnormal gait, decreased balance, decreased ROM, decreased safety awareness, increased muscle spasms, impaired flexibility, and postural dysfunction.   ACTIVITY LIMITATIONS: carrying, lifting, bending, standing, squatting, stairs, transfers, bathing, toileting, dressing, reach over head, hygiene/grooming, and locomotion level  PARTICIPATION LIMITATIONS: meal prep, cleaning, laundry, driving, shopping, community activity, and church  PERSONAL FACTORS: Age, Past/current experiences, Time since onset of injury/illness/exacerbation, and 3+ comorbidities: Pacemaker, Anemia, atrial tach, a-flutter, chronic anticoagulation, CHF, HLD, HTN, osteoporosis, PD, a-fib, femur fx surgery 2011, L partial knee replacement 2018, wrist surgery  are also affecting patient's functional outcome.   REHAB POTENTIAL: Good  CLINICAL DECISION MAKING: Evolving/moderate complexity  EVALUATION COMPLEXITY: Moderate  PLAN:  PT FREQUENCY: 1-2x/week  PT DURATION: 4  weeks  PLANNED INTERVENTIONS: 97146- PT Re-evaluation, 97110-Therapeutic exercises, 97530- Therapeutic activity, 97112- Neuromuscular re-education, [phone]- Self Care, 71696- Manual therapy, [phone]- Gait training, [phone]- Canalith repositioning, [phone]- Aquatic Therapy, 97014- Electrical stimulation (unattended), Patient/Family education, Balance training, Stair training, Taping, Dry Needling, Joint mobilization, Vestibular training, Cryotherapy, Moist heat, Therapeutic exercises, Therapeutic activity, Neuromuscular  re-education, Gait training, and Self Care  PLAN FOR NEXT SESSION: try PWR moves in quadruped; Ask if pt looked into PWR moves classes at [facility]? NU-step HIIT for home performance, PWR! Moves in standing; continue to work on ant/posterior step and weightshift for doorway management   Baldemar Friday, [place], DPT 01/14/23 5:00 PM  [facility] at [facility] [address] [place], [address] [place], [place] [address] Phone # [phone] Fax # [phone]

## 2023-01-14 ENCOUNTER — Encounter: Payer: Self-pay | Admitting: Physical Therapy

## 2023-01-14 ENCOUNTER — Ambulatory Visit: Payer: Medicare Other | Admitting: Physical Therapy

## 2023-01-14 DIAGNOSIS — R2681 Unsteadiness on feet: Secondary | ICD-10-CM | POA: Diagnosis not present

## 2023-01-14 DIAGNOSIS — R2689 Other abnormalities of gait and mobility: Secondary | ICD-10-CM

## 2023-01-15 ENCOUNTER — Ambulatory Visit (INDEPENDENT_AMBULATORY_CARE_PROVIDER_SITE_OTHER): Payer: Medicare Other

## 2023-01-15 DIAGNOSIS — I495 Sick sinus syndrome: Secondary | ICD-10-CM | POA: Diagnosis not present

## 2023-01-15 LAB — CUP PACEART REMOTE DEVICE CHECK
Battery Remaining Longevity: 112 mo
Battery Voltage: 3.01 V
Brady Statistic AP VP Percent: 90.63 %
Brady Statistic AP VS Percent: 4.45 %
Brady Statistic AS VP Percent: 2.69 %
Brady Statistic AS VS Percent: 2.22 %
Brady Statistic RA Percent Paced: 96.9 %
Brady Statistic RV Percent Paced: 93.26 %
Date Time Interrogation Session: 20241212045703
Implantable Lead Connection Status: 753985
Implantable Lead Connection Status: 753985
Implantable Lead Implant Date: 20140917
Implantable Lead Implant Date: 20140917
Implantable Lead Location: 753859
Implantable Lead Location: 753860
Implantable Pulse Generator Implant Date: 20230614
Lead Channel Impedance Value: 361 Ohm
Lead Channel Impedance Value: 380 Ohm
Lead Channel Impedance Value: 399 Ohm
Lead Channel Impedance Value: 399 Ohm
Lead Channel Pacing Threshold Amplitude: 0.75 V
Lead Channel Pacing Threshold Amplitude: 0.75 V
Lead Channel Pacing Threshold Pulse Width: 0.4 ms
Lead Channel Pacing Threshold Pulse Width: 0.4 ms
Lead Channel Sensing Intrinsic Amplitude: 0.375 mV
Lead Channel Sensing Intrinsic Amplitude: 0.375 mV
Lead Channel Sensing Intrinsic Amplitude: 17.5 mV
Lead Channel Sensing Intrinsic Amplitude: 17.5 mV
Lead Channel Setting Pacing Amplitude: 1.5 V
Lead Channel Setting Pacing Amplitude: 2 V
Lead Channel Setting Pacing Pulse Width: 0.4 ms
Lead Channel Setting Sensing Sensitivity: 1.2 mV
Zone Setting Status: 755011
Zone Setting Status: 755011

## 2023-01-20 ENCOUNTER — Encounter: Payer: Self-pay | Admitting: Physical Therapy

## 2023-01-20 ENCOUNTER — Ambulatory Visit: Payer: Medicare Other | Admitting: Physical Therapy

## 2023-01-20 DIAGNOSIS — R2689 Other abnormalities of gait and mobility: Secondary | ICD-10-CM | POA: Diagnosis not present

## 2023-01-20 DIAGNOSIS — R2681 Unsteadiness on feet: Secondary | ICD-10-CM

## 2023-01-20 NOTE — Therapy (Signed)
OUTPATIENT PHYSICAL THERAPY NEURO TREATMENT NOTE   Patient Name: [name] [name] MRN: [id number] DOB:[birth year], 82 y.o., female Today's Date: 01/20/2023   PCP: Rodrigo Ran, MD  REFERRING PROVIDER: Tat, Octaviano Batty, DO      END OF SESSION:  PT End of Session - 01/20/23 1324     Visit Number 12    Number of Visits 15    Date for PT Re-Evaluation 01/26/23    Authorization Type Medicare    PT Start Time 1320    PT Stop Time 1354   pt nauseated, doesn't feel well due to cold   PT Time Calculation (min) 34 min    Equipment Utilized During Treatment Gait belt    Activity Tolerance Patient tolerated treatment well    Behavior During Therapy WFL for tasks assessed/performed                    Past Medical History:  Diagnosis Date   Anemia    Arthritis    thumbs   Atrial tachycardia (HCC)    Atypical atrial flutter (HCC)    Cataract    bilateral   Chronic anticoagulation    on Xarelto   Chronic diastolic CHF (congestive heart failure) (HCC)    Echocardiogram (11/24/12): Mild LVH, EF 60%.   Dysrhythmia    GERD (gastroesophageal reflux disease)    hx of no meds   Heart murmur    slight   Hyperlipidemia    a. Hx of leg weakness while on statin. under control   Hypertension    Hypothyroidism    Leg fracture Dec 21,2011   Osteoporosis 07/2007   Parkinson's disease (HCC) 2011   Persistent atrial fibrillation (HCC)    s/p atrial fib ablation 11-11-11.    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HCC)    Squamous carcinoma summer 2015   back of left thigh   Wrist fracture    right   Past Surgical History:  Procedure Laterality Date   ATRIAL FIBRILLATION ABLATION N/A 11/11/2011   Procedure: ATRIAL FIBRILLATION ABLATION;  Surgeon: Hillis Range, MD;  Location: [facility] CATH LAB;  Service: Cardiovascular;   Laterality: N/A;   BREAST BIOPSY  1998   BREAST SURGERY     benign cyst removed   CARDIAC ELECTROPHYSIOLOGY STUDY AND ABLATION  5/14, 7/14   convergent AF ablation at [facility] and subsequent atypical atrial flutter ablation by Dr Hurman Horn   CARDIOVASCULAR STRESS TEST  06-06-2008   EF 73%   CARDIOVERSION  09/09/2011   Procedure: CARDIOVERSION;  Surgeon: Cassell Clement, MD;  Location: [facility] ENDOSCOPY;  Service: Cardiovascular;  Laterality: N/A;  to be done by dr. Patty Sermons   CARDIOVERSION  02/18/2012   Procedure: CARDIOVERSION;  Surgeon: Vesta Mixer, MD;  Location: [facility] ENDOSCOPY;  Service: Cardiovascular;  Laterality: N/A;   CHOLECYSTECTOMY  1995   FEMUR FRACTURE SURGERY  2011   metal pin in place   FOREARM SURGERY Left 2010   "opened arm to drain it"   INSERTION OF MESH N/A 11/24/2013   Procedure: INSERTION OF MESH;  Surgeon: Karie Soda, MD;  Location: [facility] ORS;  Service: General;  Laterality: N/A;   IR THORACENTESIS ASP PLEURAL SPACE W/IMG GUIDE  02/25/2022   KNEE SURGERY Left 2001   Left unicompartmental knee     Dr. Charlann Boxer 11/18/16  PACEMAKER PLACEMENT  10/20/12   MDT Advisa DR implanted by Dr Hurman Horn at [facility]    PARTIAL KNEE ARTHROPLASTY Left 11/18/2016   Procedure: LEFT UNICOMPARTMENTAL KNEE Medially;  Surgeon: Durene Romans, MD;  Location: [facility] ORS;  Service: Orthopedics;  Laterality: Left;  90 mins   PPM GENERATOR CHANGEOUT N/A 07/17/2021   Procedure: PPM GENERATOR CHANGEOUT;  Surgeon: Regan Lemming, MD;  Location: [facility] INVASIVE CV LAB;  Service: Cardiovascular;  Laterality: N/A;   RIGHT HEART CATH N/A 06/13/2021   Procedure: RIGHT HEART CATH;  Surgeon: Laurey Morale, MD;  Location: [facility] INVASIVE CV LAB;  Service: Cardiovascular;  Laterality: N/A;   RIGHT HEART CATH N/A 03/06/2022   Procedure: RIGHT HEART CATH;  Surgeon: Laurey Morale, MD;  Location: [facility] INVASIVE CV LAB;  Service: Cardiovascular;  Laterality: N/A;   SKIN CANCER DESTRUCTION  summer 2015   TEE WITHOUT CARDIOVERSION   11/10/2011   Procedure: TRANSESOPHAGEAL ECHOCARDIOGRAM (TEE);  Surgeon: Vesta Mixer, MD;  Location: [facility] ENDOSCOPY;  Service: Cardiovascular;  Laterality: N/A;   TEE WITHOUT CARDIOVERSION N/A 04/23/2022   Procedure: TRANSESOPHAGEAL ECHOCARDIOGRAM (TEE);  Surgeon: Laurey Morale, MD;  Location: [facility] ENDOSCOPY;  Service: Cardiovascular;  Laterality: N/A;   TONSILLECTOMY  as child   US ECHOCARDIOGRAPHY  03-10-2008   Est EF 55-60%, Dr. 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 05/08/2021   Aortic atherosclerosis (HCC) 12/10/2020   Normocytic anemia 07/09/2018   Cirrhosis (HCC) 11/09/2017   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/18/2016   Bilateral impacted cerumen 02/21/2016   Presbycusis of both ears 02/21/2016   Atypical atrial flutter (HCC) 05/28/2015   Diastolic dysfunction 06/13/2014   Ventral incisional hernia - epigastric 07/21/2013   Long term (current) use of anticoagulants 07/21/2013   Chronic diastolic heart failure (HCC) 12/01/2012   Paralysis agitans (HCC) 10/26/2012   Pacemaker 10/20/2012   Sick sinus syndrome (HCC) 10/20/2012   GERD (gastroesophageal reflux disease) 06/09/2012   Hyperlipidemia 06/09/2012   Parkinson's disease (HCC) 06/09/2012   Osteoporosis 06/09/2012   Hypertension 10/12/2011   Chest pain 06/03/2011   Atrial fibrillation (HCC) 10/31/2010    ONSET DATE: 10+ years   REFERRING DIAG: G20.B2  (ICD-10-CM) - Parkinson's disease with dyskinesia and fluctuating manifestations (HCC)  THERAPY DIAG:  Other abnormalities of gait and mobility  Unsteadiness on feet  Rationale for Evaluation and Treatment: Rehabilitation  SUBJECTIVE:  SUBJECTIVE STATEMENT: No falls, still have some cramps, but they haven't been as bad over the past few nights.  Not doing great, as I have a cold today (provided pt with a mask)  Pt accompanied by: self  PERTINENT HISTORY: Pacemaker, Anemia, atrial tach, a-flutter, chronic anticoagulation, CHF, HLD, HTN, osteoporosis, PD, a-fib, femur fx surgery 2011, L partial knee replacement 2018, wrist surgery  PAIN:  Are you having pain? No  PRECAUTIONS: Fall and ICD/Pacemaker  RED FLAGS: None   WEIGHT BEARING RESTRICTIONS: No  FALLS: Has patient fallen in last 6 months? Yes. Number of falls 3; reports 1 fall resulted in head trauma but "I had sunglasses on so it only hit the sunglasses."  LIVING ENVIRONMENT: Lives with: lives with their spouse Lives in: Other independent living at [place], getting ready to move in another home within [employer] Stairs: No Has following equipment at home: Single point cane and Shower bench  PLOF: Independent; likes reading, walking, and working puzzles  PATIENT GOALS: work on balance   OBJECTIVE:    TODAY'S TREATMENT: 01/20/2023 Activity Comments  standing PWR! Up with Bilat therapy poles  10 reps with mirror cues  Standing PWR! Up x 10 PWR! Rock 2 x 10 PWR! Twist 2 x 10 PWR! Step 2 x 10 UE support at chair  Additional few reps of PWR! Moves with picture cues For addition to HEP  Forward/back step and weightshift x 10 reps At counter, good form             PATIENT EDUCATION: Education details: added standing PWR!  Moves to HEP, addition of forward/back step and weightshift Person educated: Patient Education method: Explanation Education comprehension: verbalized understanding    HOME EXERCISE PROGRAM: Access Code: [email or web address] URL: [email or web address] Date: 01/20/2023 Prepared by: [facility] - Outpatient  Rehab - [facility]  Exercises - Sit to Stand Without Arm Support  - 1 x daily - 5 x weekly - 2 sets - 10 reps - Standing with Head Rotation  - 1 x daily - 5 x weekly - 2 sets - 30 sec hold - Romberg Stance with Head Nods  - 1 x daily - 5 x weekly - 2 sets - 30 sec hold - Corner Balance Feet Together With Eyes Closed  - 1 x daily - 7 x weekly - 3 sets - 30 sec hold - Standing Toe Taps  - 1 x daily - 5 x weekly - 2 sets - 10 reps - Gastroc Stretch on Wall  - 1 x daily - 5 x weekly - 2 sets - 30 sec hold - Standing Quarter Turn with Counter Support  - 1 x daily - 7 x weekly - 1 sets - 3-5 reps - Standing Single Leg Stance with Counter Support  - 1 x daily - 7 x weekly - 3 sets - 10 sec hold - Alternating Step forward-Balance Reaction  - 1 x daily - 7 x weekly - 1 sets - 10 reps  Standing PWR! Moves 2 x 10 reps-at counter, 1x/day   Note: Objective measures were completed at Evaluation unless otherwise noted.  DIAGNOSTIC FINDINGS: none recent  COGNITION: Overall cognitive status: Within functional limits for tasks assessed   SENSATION: Reports baseline N/T in the L lower leg  COORDINATION: Alternating pronation/supination: WNL B Alternating toe tap: WNL B Finger to nose: WNL B   MUSCLE TONE: WNL B LEs   POSTURE: rounded shoulders, forward head, and head flexed slightly L; dyskinesias at rest  LOWER EXTREMITY  ROM:     Active  Right Eval Left Eval  Hip flexion    Hip extension    Hip abduction    Hip adduction    Hip internal rotation    Hip external rotation    Knee flexion    Knee extension    Ankle dorsiflexion 5 18  Ankle plantarflexion    Ankle  inversion    Ankle eversion     (Blank rows = not tested)  LOWER EXTREMITY MMT:    MMT (in sitting) Right Eval Left Eval  Hip flexion 5 5  Hip extension    Hip abduction 4+ 4+  Hip adduction 4+ 4+  Hip internal rotation    Hip external rotation    Knee flexion 4+ 5  Knee extension 4+ 5  Ankle dorsiflexion 4+ 4+  Ankle plantarflexion 4+ 4+  Ankle inversion    Ankle eversion    (Blank rows = not tested)     GAIT: Gait pattern: limited heel-toe pattern, R LE scissoring occasionally; tendency to perform several quick steps ; occasional freezing  Assistive device utilized: None Level of assistance: SBA   FUNCTIONAL TESTS:    Completed 360 degree turn in 10 steps        GOALS: Goals reviewed with patient? Yes  SHORT TERM GOALS: Target date: 01/05/2023    Patient to be independent with initial HEP. Baseline: HEP initiated Goal status: NOT MET   > Demo improved postural stability per mild sway x 30 sec condition 4 M-CTSIB to improve safety with ADL   Baseline: moderate-severe   Goal status: INITIAL  LONG TERM GOALS: Target date: 01/26/2023    Patient to be independent with advanced HEP and to include HIIT Baseline: in development Goal status: IN PROGRESS  Patient to verbalize understanding of local Parkinson's Disease community resources including community fitness post D/C.   Baseline: in development  Goal status: IN PROGRESS  Patient to improve MiniBestTest score to atleast 17-21 to decrease risk of falls.  Baseline: 13/28; (12/22/22) 21/28 Goal status: MET  Patient to complete 360 degree turn in 8 steps or less.  Baseline: 10 steps; (12/22/22) 2-4 steps Goal status: MET  Patient to demonstrate 5xSTS test in <15 sec without posterior LOB in order to decrease risk of falls.   Baseline: 9.2 sec with posterior lean/LOB; (12/22/22) 12 sec Goal status: MET  Patient to verbalize tips to reduce freezing/festination with gait and turns. Baseline: in  development Goal status: IN PROGRESS   ASSESSMENT:  CLINICAL IMPRESSION: Worked on standing PWR! Moves today and forward/back stepping exercises, with pt performing well with cues for UE support and slowed pace.  Added to HEP.  Pt does continue to demo festination with small space negotiation.  Session ended slightly early due to pt feeling nauseated and congested due to a cold.    OBJECTIVE IMPAIRMENTS: Abnormal gait, decreased balance, decreased ROM, decreased safety awareness, increased muscle spasms, impaired flexibility, and postural dysfunction.   ACTIVITY LIMITATIONS: carrying, lifting, bending, standing, squatting, stairs, transfers, bathing, toileting, dressing, reach over head, hygiene/grooming, and locomotion level  PARTICIPATION LIMITATIONS: meal prep, cleaning, laundry, driving, shopping, community activity, and church  PERSONAL FACTORS: Age, Past/current experiences, Time since onset of injury/illness/exacerbation, and 3+ comorbidities: Pacemaker, Anemia, atrial tach, a-flutter, chronic anticoagulation, CHF, HLD, HTN, osteoporosis, PD, a-fib, femur fx surgery 2011, L partial knee replacement 2018, wrist surgery  are also affecting patient's functional outcome.   REHAB POTENTIAL: Good  CLINICAL DECISION MAKING: Evolving/moderate complexity  EVALUATION  COMPLEXITY: Moderate  PLAN:  PT FREQUENCY: 1-2x/week  PT DURATION: 4 weeks  PLANNED INTERVENTIONS: 97146- PT Re-evaluation, 97110-Therapeutic exercises, 97530- Therapeutic activity, 97112- Neuromuscular re-education, 97535- Self Care, 82956- Manual therapy, [phone]- Gait training, [phone]- Canalith repositioning, [phone]- Aquatic Therapy, 97014- Electrical stimulation (unattended), Patient/Family education, Balance training, Stair training, Taping, Dry Needling, Joint mobilization, Vestibular training, Cryotherapy, Moist heat, Therapeutic exercises, Therapeutic activity, Neuromuscular re-education, Gait training, and Self  Care  PLAN FOR NEXT SESSION: Need to check LTGs and discuss POC.  Pt is in agreement to d/c next visit, per conversation today.  Check how she did with new HEP additions today.    Lonia Blood, PT 01/20/23 1:56 PM Phone: [phone] Fax: [phone]  [facility] at [facility] [address] [place], [address] [place], [place] [address] Phone # [phone] Fax # [phone]

## 2023-01-22 ENCOUNTER — Ambulatory Visit: Payer: Medicare Other

## 2023-01-29 ENCOUNTER — Telehealth: Payer: Self-pay | Admitting: Pharmacy Technician

## 2023-01-29 ENCOUNTER — Telehealth: Payer: Self-pay | Admitting: Neurology

## 2023-01-29 DIAGNOSIS — G20B2 Parkinson's disease with dyskinesia, with fluctuations: Secondary | ICD-10-CM

## 2023-01-29 NOTE — Telephone Encounter (Signed)
PA has been submitted, and telephone encounter has been created. 

## 2023-01-29 NOTE — Telephone Encounter (Signed)
Per Dr Don Perking office note pt should be taken. PT called an advised,  1.  Parkinsons Disease             - continue carbidopa/levodopa 25/100, 2 tablets three times per day and move dosages to 8am/noon/4pm             -stop carbidopa/levodopa CR at night as not helping cramping             -add rytary 145 mg, 2 po q hs. Pt stated that she has no pills in her pill pack that are the same at night,  [place] what is going on. Pt Rytary [facility] not in pt pill pick since October medication has needed a PA will send a message to the PA team to get a PA started. I called Mr Demonbreun back to let her know what was going on

## 2023-01-29 NOTE — Telephone Encounter (Signed)
Pharmacy Patient Advocate Encounter   Received notification from Pt Calls Messages that prior authorization for RYTARY 145MG  is required/requested.   Insurance verification completed.   The patient is insured through [facility] .   Per test claim: PA required; PA submitted to above mentioned insurance via CoverMyMeds Key/confirmation #/EOC [id number] Status is pending

## 2023-01-29 NOTE — Telephone Encounter (Signed)
Patient needs a refill on her night time carbi/levo. She seems confused on what medicine she is taking. She said she had samples and stopped one of the carbi/levo but isnt sure what she needs. She would like to speak with someone about the correct medicine  Pharmacy [facility]

## 2023-01-30 ENCOUNTER — Other Ambulatory Visit (HOSPITAL_COMMUNITY): Payer: Self-pay

## 2023-01-30 NOTE — Telephone Encounter (Signed)
Pharmacy Patient Advocate Encounter  Received notification from [facility] that Prior Authorization for Rytary 36.25-145MG  er capsules has been APPROVED from 02/03/2022 to 12/26/225. Ran test claim, Copay is $0.00. This test claim was processed through [facility]- copay amounts may vary at other pharmacies due to pharmacy/plan contracts, or as the patient moves through the different stages of their insurance plan.   PA #/Case ID/Reference #: [id number]

## 2023-02-02 MED ORDER — RYTARY 36.25-145 MG PO CPCR: 2.0000 | ORAL_CAPSULE | Freq: Every day | ORAL | 0 refills | Status: DC

## 2023-02-02 NOTE — Telephone Encounter (Signed)
Sent to pharmacy with approval pt called an informed

## 2023-02-02 NOTE — Addendum Note (Signed)
Addended by: Dimas Chyle on: 02/02/2023 08:06 AM   Modules accepted: Orders

## 2023-02-05 ENCOUNTER — Other Ambulatory Visit: Payer: Medicare Other

## 2023-02-06 ENCOUNTER — Inpatient Hospital Stay: Payer: Medicare Other | Attending: Hematology and Oncology

## 2023-02-06 DIAGNOSIS — D649 Anemia, unspecified: Secondary | ICD-10-CM | POA: Diagnosis not present

## 2023-02-06 LAB — CBC WITH DIFFERENTIAL (CANCER CENTER ONLY)
Abs Immature Granulocytes: 0.02 10*3/uL (ref 0.00–0.07)
Basophils Absolute: 0.1 10*3/uL (ref 0.0–0.1)
Basophils Relative: 1 %
Eosinophils Absolute: 0 10*3/uL (ref 0.0–0.5)
Eosinophils Relative: 0 %
HCT: 38 % (ref 36.0–46.0)
Hemoglobin: 12.6 g/dL (ref 12.0–15.0)
Immature Granulocytes: 0 %
Lymphocytes Relative: 10 %
Lymphs Abs: 0.6 10*3/uL — ABNORMAL LOW (ref 0.7–4.0)
MCH: 28.9 pg (ref 26.0–34.0)
MCHC: 33.2 g/dL (ref 30.0–36.0)
MCV: 87.2 fL (ref 80.0–100.0)
Monocytes Absolute: 0.5 10*3/uL (ref 0.1–1.0)
Monocytes Relative: 8 %
Neutro Abs: 5.5 10*3/uL (ref 1.7–7.7)
Neutrophils Relative %: 81 %
Platelet Count: 206 10*3/uL (ref 150–400)
RBC: 4.36 MIL/uL (ref 3.87–5.11)
RDW: 22.4 % — ABNORMAL HIGH (ref 11.5–15.5)
WBC Count: 6.8 10*3/uL (ref 4.0–10.5)
nRBC: 0 % (ref 0.0–0.2)

## 2023-02-06 LAB — IRON AND IRON BINDING CAPACITY (CC-WL,HP ONLY)
Iron: 123 ug/dL (ref 28–170)
Saturation Ratios: 36 % — ABNORMAL HIGH (ref 10.4–31.8)
TIBC: 346 ug/dL (ref 250–450)
UIBC: 223 ug/dL (ref 148–442)

## 2023-02-06 LAB — FERRITIN: Ferritin: 246 ng/mL (ref 11–307)

## 2023-02-10 ENCOUNTER — Telehealth: Payer: Self-pay | Admitting: Neurology

## 2023-02-10 DIAGNOSIS — R1012 Left upper quadrant pain: Secondary | ICD-10-CM | POA: Diagnosis not present

## 2023-02-10 DIAGNOSIS — I480 Paroxysmal atrial fibrillation: Secondary | ICD-10-CM | POA: Diagnosis not present

## 2023-02-10 DIAGNOSIS — I110 Hypertensive heart disease with heart failure: Secondary | ICD-10-CM | POA: Diagnosis not present

## 2023-02-10 DIAGNOSIS — R109 Unspecified abdominal pain: Secondary | ICD-10-CM | POA: Diagnosis not present

## 2023-02-10 DIAGNOSIS — N179 Acute kidney failure, unspecified: Secondary | ICD-10-CM | POA: Diagnosis not present

## 2023-02-10 DIAGNOSIS — I272 Pulmonary hypertension, unspecified: Secondary | ICD-10-CM | POA: Diagnosis not present

## 2023-02-10 DIAGNOSIS — Z950 Presence of cardiac pacemaker: Secondary | ICD-10-CM | POA: Diagnosis not present

## 2023-02-10 DIAGNOSIS — R110 Nausea: Secondary | ICD-10-CM | POA: Diagnosis not present

## 2023-02-10 DIAGNOSIS — I5032 Chronic diastolic (congestive) heart failure: Secondary | ICD-10-CM | POA: Diagnosis not present

## 2023-02-10 DIAGNOSIS — Z1389 Encounter for screening for other disorder: Secondary | ICD-10-CM | POA: Diagnosis not present

## 2023-02-10 DIAGNOSIS — G20A1 Parkinson's disease without dyskinesia, without mention of fluctuations: Secondary | ICD-10-CM | POA: Diagnosis not present

## 2023-02-10 DIAGNOSIS — K7460 Unspecified cirrhosis of liver: Secondary | ICD-10-CM | POA: Diagnosis not present

## 2023-02-10 DIAGNOSIS — R252 Cramp and spasm: Secondary | ICD-10-CM | POA: Diagnosis not present

## 2023-02-10 DIAGNOSIS — Z0000 Encounter for general adult medical examination without abnormal findings: Secondary | ICD-10-CM | POA: Diagnosis not present

## 2023-02-10 NOTE — Telephone Encounter (Signed)
 Telephone call to patient, To advised Script for Rytary  was sent into [place] [facility] pharmacy.  Per patient she was advised by the pharmacy they do not have.   Telephone call to [facility], Per rep the script we sent in on 02/02/23 is there for the patient. But she did ask her co-worker and they did speak to the patient and she ask for Her bp medication no Rytary .   LMOVM for patient medication is there and she can call to refill. But to call her PCP or Cardiologist in regards to her BP medication.

## 2023-02-10 NOTE — Telephone Encounter (Signed)
 1. Which medications need refilled? (List name and dosage, if known) Rytary  2. Which pharmacy/location is medication to be sent to? (include street and city if local pharmacy) [facility] - was sent to incorrect pharmacy previously

## 2023-02-20 NOTE — Progress Notes (Signed)
Remote pacemaker transmission.   

## 2023-02-24 DIAGNOSIS — I272 Pulmonary hypertension, unspecified: Secondary | ICD-10-CM | POA: Diagnosis not present

## 2023-02-24 DIAGNOSIS — I480 Paroxysmal atrial fibrillation: Secondary | ICD-10-CM | POA: Diagnosis not present

## 2023-02-24 DIAGNOSIS — K7460 Unspecified cirrhosis of liver: Secondary | ICD-10-CM | POA: Diagnosis not present

## 2023-02-24 DIAGNOSIS — I5032 Chronic diastolic (congestive) heart failure: Secondary | ICD-10-CM | POA: Diagnosis not present

## 2023-02-24 DIAGNOSIS — I110 Hypertensive heart disease with heart failure: Secondary | ICD-10-CM | POA: Diagnosis not present

## 2023-02-24 DIAGNOSIS — N179 Acute kidney failure, unspecified: Secondary | ICD-10-CM | POA: Diagnosis not present

## 2023-02-24 DIAGNOSIS — G20A1 Parkinson's disease without dyskinesia, without mention of fluctuations: Secondary | ICD-10-CM | POA: Diagnosis not present

## 2023-02-24 DIAGNOSIS — K922 Gastrointestinal hemorrhage, unspecified: Secondary | ICD-10-CM | POA: Diagnosis not present

## 2023-02-24 DIAGNOSIS — R252 Cramp and spasm: Secondary | ICD-10-CM | POA: Diagnosis not present

## 2023-02-24 DIAGNOSIS — R945 Abnormal results of liver function studies: Secondary | ICD-10-CM | POA: Diagnosis not present

## 2023-02-24 DIAGNOSIS — Z950 Presence of cardiac pacemaker: Secondary | ICD-10-CM | POA: Diagnosis not present

## 2023-02-24 DIAGNOSIS — K219 Gastro-esophageal reflux disease without esophagitis: Secondary | ICD-10-CM | POA: Diagnosis not present

## 2023-02-26 ENCOUNTER — Encounter: Payer: Self-pay | Admitting: Oncology

## 2023-02-26 NOTE — Telephone Encounter (Signed)
Patient called to reschedule appointment for a later day. Patient states she has another appointment on @2  that 02/11.[name] Appointment reschedule for 345 2/11

## 2023-03-04 DIAGNOSIS — R110 Nausea: Secondary | ICD-10-CM | POA: Diagnosis not present

## 2023-03-04 DIAGNOSIS — I110 Hypertensive heart disease with heart failure: Secondary | ICD-10-CM | POA: Diagnosis not present

## 2023-03-04 DIAGNOSIS — Z950 Presence of cardiac pacemaker: Secondary | ICD-10-CM | POA: Diagnosis not present

## 2023-03-04 DIAGNOSIS — I480 Paroxysmal atrial fibrillation: Secondary | ICD-10-CM | POA: Diagnosis not present

## 2023-03-04 DIAGNOSIS — K7460 Unspecified cirrhosis of liver: Secondary | ICD-10-CM | POA: Diagnosis not present

## 2023-03-04 DIAGNOSIS — G20A1 Parkinson's disease without dyskinesia, without mention of fluctuations: Secondary | ICD-10-CM | POA: Diagnosis not present

## 2023-03-04 DIAGNOSIS — M810 Age-related osteoporosis without current pathological fracture: Secondary | ICD-10-CM | POA: Diagnosis not present

## 2023-03-04 DIAGNOSIS — R7301 Impaired fasting glucose: Secondary | ICD-10-CM | POA: Diagnosis not present

## 2023-03-04 DIAGNOSIS — I272 Pulmonary hypertension, unspecified: Secondary | ICD-10-CM | POA: Diagnosis not present

## 2023-03-04 DIAGNOSIS — N393 Stress incontinence (female) (male): Secondary | ICD-10-CM | POA: Diagnosis not present

## 2023-03-04 DIAGNOSIS — I5032 Chronic diastolic (congestive) heart failure: Secondary | ICD-10-CM | POA: Diagnosis not present

## 2023-03-04 DIAGNOSIS — K148 Other diseases of tongue: Secondary | ICD-10-CM | POA: Diagnosis not present

## 2023-03-09 ENCOUNTER — Telehealth (HOSPITAL_COMMUNITY): Payer: Self-pay | Admitting: Cardiology

## 2023-03-09 NOTE — Telephone Encounter (Signed)
 Daughter aware.

## 2023-03-09 NOTE — Telephone Encounter (Signed)
Pts daughter called to report unable to sleep well with chest discomfort and shallow breathing Minimal R arm pain Dry mouth ?dehydration despite plenty po fluids    -no weight to report -no vitals to report -reports no missed doses of medication -no swelling or abd distention     Daughter has been staying with mother since Friday and noticed her mother is very uncomfortable with no sleep and chest discomfort  Please advise

## 2023-03-16 DIAGNOSIS — H35721 Serous detachment of retinal pigment epithelium, right eye: Secondary | ICD-10-CM | POA: Diagnosis not present

## 2023-03-16 DIAGNOSIS — H353221 Exudative age-related macular degeneration, left eye, with active choroidal neovascularization: Secondary | ICD-10-CM | POA: Diagnosis not present

## 2023-03-16 DIAGNOSIS — H353132 Nonexudative age-related macular degeneration, bilateral, intermediate dry stage: Secondary | ICD-10-CM | POA: Diagnosis not present

## 2023-03-16 DIAGNOSIS — H4052X3 Glaucoma secondary to other eye disorders, left eye, severe stage: Secondary | ICD-10-CM | POA: Diagnosis not present

## 2023-03-16 DIAGNOSIS — H43812 Vitreous degeneration, left eye: Secondary | ICD-10-CM | POA: Diagnosis not present

## 2023-03-16 DIAGNOSIS — H353211 Exudative age-related macular degeneration, right eye, with active choroidal neovascularization: Secondary | ICD-10-CM | POA: Diagnosis not present

## 2023-03-17 ENCOUNTER — Inpatient Hospital Stay: Payer: Medicare Other | Attending: Hematology and Oncology

## 2023-03-17 ENCOUNTER — Other Ambulatory Visit: Payer: Medicare Other

## 2023-03-17 DIAGNOSIS — D649 Anemia, unspecified: Secondary | ICD-10-CM | POA: Insufficient documentation

## 2023-03-17 LAB — IRON AND IRON BINDING CAPACITY (CC-WL,HP ONLY)
Iron: 49 ug/dL (ref 28–170)
Saturation Ratios: 15 % (ref 10.4–31.8)
TIBC: 326 ug/dL (ref 250–450)
UIBC: 277 ug/dL (ref 148–442)

## 2023-03-17 LAB — CBC WITH DIFFERENTIAL (CANCER CENTER ONLY)
Abs Immature Granulocytes: 0.03 10*3/uL (ref 0.00–0.07)
Basophils Absolute: 0 10*3/uL (ref 0.0–0.1)
Basophils Relative: 1 %
Eosinophils Absolute: 0 10*3/uL (ref 0.0–0.5)
Eosinophils Relative: 1 %
HCT: 33.5 % — ABNORMAL LOW (ref 36.0–46.0)
Hemoglobin: 11.1 g/dL — ABNORMAL LOW (ref 12.0–15.0)
Immature Granulocytes: 1 %
Lymphocytes Relative: 7 %
Lymphs Abs: 0.4 10*3/uL — ABNORMAL LOW (ref 0.7–4.0)
MCH: 30.9 pg (ref 26.0–34.0)
MCHC: 33.1 g/dL (ref 30.0–36.0)
MCV: 93.3 fL (ref 80.0–100.0)
Monocytes Absolute: 0.6 10*3/uL (ref 0.1–1.0)
Monocytes Relative: 9 %
Neutro Abs: 5.1 10*3/uL (ref 1.7–7.7)
Neutrophils Relative %: 81 %
Platelet Count: 234 10*3/uL (ref 150–400)
RBC: 3.59 MIL/uL — ABNORMAL LOW (ref 3.87–5.11)
RDW: 18.8 % — ABNORMAL HIGH (ref 11.5–15.5)
WBC Count: 6.2 10*3/uL (ref 4.0–10.5)
nRBC: 0 % (ref 0.0–0.2)

## 2023-03-18 LAB — FERRITIN: Ferritin: 100 ng/mL (ref 11–307)

## 2023-03-26 ENCOUNTER — Telehealth: Payer: Self-pay | Admitting: Neurology

## 2023-03-26 DIAGNOSIS — Z7901 Long term (current) use of anticoagulants: Secondary | ICD-10-CM | POA: Diagnosis not present

## 2023-03-26 DIAGNOSIS — Z041 Encounter for examination and observation following transport accident: Secondary | ICD-10-CM | POA: Diagnosis not present

## 2023-03-26 DIAGNOSIS — R110 Nausea: Secondary | ICD-10-CM | POA: Diagnosis not present

## 2023-03-26 DIAGNOSIS — W19XXXA Unspecified fall, initial encounter: Secondary | ICD-10-CM | POA: Diagnosis not present

## 2023-03-26 DIAGNOSIS — S0990XA Unspecified injury of head, initial encounter: Secondary | ICD-10-CM | POA: Diagnosis not present

## 2023-03-26 NOTE — Telephone Encounter (Signed)
Called patient and helped instruct patient on scheduling an appointment at an urgent care so she did not have to wait

## 2023-03-26 NOTE — Telephone Encounter (Signed)
[name] [name] fell this weekend this weekend. Tat told her to go get it checked out. She didn't go to ER cause she didn't want to sit in ER 8 hours. She wants to know if tat can send her to someone to to get checked out so she wont have to wait

## 2023-03-27 ENCOUNTER — Ambulatory Visit
Admission: RE | Admit: 2023-03-27 | Discharge: 2023-03-27 | Disposition: A | Discharge status: Home / Self Care | Payer: Medicare Other | Source: Ambulatory Visit | Attending: Gastroenterology | Admitting: Gastroenterology

## 2023-03-27 ENCOUNTER — Other Ambulatory Visit: Payer: Self-pay | Admitting: Gastroenterology

## 2023-03-27 DIAGNOSIS — K7460 Unspecified cirrhosis of liver: Secondary | ICD-10-CM | POA: Diagnosis not present

## 2023-03-27 DIAGNOSIS — R110 Nausea: Secondary | ICD-10-CM | POA: Diagnosis not present

## 2023-03-27 DIAGNOSIS — K5900 Constipation, unspecified: Secondary | ICD-10-CM

## 2023-03-30 ENCOUNTER — Other Ambulatory Visit (HOSPITAL_COMMUNITY): Payer: Self-pay | Admitting: Cardiology

## 2023-03-30 ENCOUNTER — Other Ambulatory Visit: Payer: Self-pay | Admitting: Neurology

## 2023-03-30 DIAGNOSIS — G20A1 Parkinson's disease without dyskinesia, without mention of fluctuations: Secondary | ICD-10-CM

## 2023-03-30 DIAGNOSIS — G20B1 Parkinson's disease with dyskinesia, without mention of fluctuations: Secondary | ICD-10-CM

## 2023-04-02 ENCOUNTER — Encounter (HOSPITAL_COMMUNITY): Payer: Self-pay | Admitting: Cardiology

## 2023-04-02 ENCOUNTER — Ambulatory Visit (HOSPITAL_COMMUNITY)
Admission: RE | Admit: 2023-04-02 | Discharge: 2023-04-02 | Disposition: A | Discharge status: Home / Self Care | Payer: Medicare Other | Source: Ambulatory Visit | Attending: Cardiology | Admitting: Cardiology

## 2023-04-02 VITALS — BP 124/70 | HR 75 | Wt 119.2 lb

## 2023-04-02 DIAGNOSIS — G20A1 Parkinson's disease without dyskinesia, without mention of fluctuations: Secondary | ICD-10-CM | POA: Diagnosis not present

## 2023-04-02 DIAGNOSIS — I272 Pulmonary hypertension, unspecified: Secondary | ICD-10-CM | POA: Insufficient documentation

## 2023-04-02 DIAGNOSIS — I361 Nonrheumatic tricuspid (valve) insufficiency: Secondary | ICD-10-CM | POA: Insufficient documentation

## 2023-04-02 DIAGNOSIS — I495 Sick sinus syndrome: Secondary | ICD-10-CM | POA: Insufficient documentation

## 2023-04-02 DIAGNOSIS — Z950 Presence of cardiac pacemaker: Secondary | ICD-10-CM | POA: Insufficient documentation

## 2023-04-02 DIAGNOSIS — I480 Paroxysmal atrial fibrillation: Secondary | ICD-10-CM | POA: Insufficient documentation

## 2023-04-02 DIAGNOSIS — Z7901 Long term (current) use of anticoagulants: Secondary | ICD-10-CM | POA: Diagnosis not present

## 2023-04-02 DIAGNOSIS — I5032 Chronic diastolic (congestive) heart failure: Secondary | ICD-10-CM

## 2023-04-02 DIAGNOSIS — K7460 Unspecified cirrhosis of liver: Secondary | ICD-10-CM | POA: Diagnosis not present

## 2023-04-02 DIAGNOSIS — D509 Iron deficiency anemia, unspecified: Secondary | ICD-10-CM | POA: Insufficient documentation

## 2023-04-02 LAB — BASIC METABOLIC PANEL
Anion gap: 10 (ref 5–15)
BUN: 21 mg/dL (ref 8–23)
CO2: 29 mmol/L (ref 22–32)
Calcium: 9.5 mg/dL (ref 8.9–10.3)
Chloride: 98 mmol/L (ref 98–111)
Creatinine, Ser: 1.03 mg/dL — ABNORMAL HIGH (ref 0.44–1.00)
GFR, Estimated: 55 mL/min — ABNORMAL LOW (ref >60)
Glucose, Bld: 102 mg/dL — ABNORMAL HIGH (ref 70–99)
Potassium: 4 mmol/L (ref 3.5–5.1)
Sodium: 137 mmol/L (ref 135–145)

## 2023-04-02 LAB — BRAIN NATRIURETIC PEPTIDE: B Natriuretic Peptide: 138.2 pg/mL — ABNORMAL HIGH (ref 0.0–100.0)

## 2023-04-02 NOTE — Patient Instructions (Signed)
 There has been no changes to your medications.  Labs done today, your results will be available in MyChart, we will contact you for abnormal readings.  YOU WILL BE CALLED TO HAVE YOUR IRON INFUSION SCHEDULED  Your physician has requested that you have an echocardiogram. Echocardiography is a painless test that uses sound waves to create images of your heart. It provides your doctor with information about the size and shape of your heart and how well your heart's chambers and valves are working. This procedure takes approximately one hour. There are no restrictions for this procedure. Please do NOT wear cologne, perfume, aftershave, or lotions (deodorant is allowed). Please arrive 15 minutes prior to your appointment time.  Please note: We ask at that you not bring children with you during ultrasound (echo/ vascular) testing. Due to room size and safety concerns, children are not allowed in the ultrasound rooms during exams. Our front office staff cannot provide observation of children in our lobby area while testing is being conducted. An adult accompanying a patient to their appointment will only be allowed in the ultrasound room at the discretion of the ultrasound technician under special circumstances. We apologize for any inconvenience.  Your physician recommends that you schedule a follow-up appointment in: 3 months.  If you have any questions or concerns before your next appointment please send [place] a message through [place] or call our office at [phone].    TO LEAVE A MESSAGE FOR THE NURSE SELECT OPTION 2, PLEASE LEAVE A MESSAGE INCLUDING: YOUR NAME DATE OF BIRTH CALL BACK NUMBER REASON FOR CALL**this is important as we prioritize the call backs  YOU WILL RECEIVE A CALL BACK THE SAME DAY AS LONG AS YOU CALL BEFORE 4:00 PM  At the [facility], you and your health needs are our priority. As part of our continuing mission to provide you with exceptional heart care, we  have created designated Provider Care Teams. These Care Teams include your primary Cardiologist (physician) and Advanced Practice Providers (APPs- Physician Assistants and Nurse Practitioners) who all work together to provide you with the care you need, when you need it.   You may see any of the following providers on your designated Care Team at your next follow up: Dr Arvilla Meres Dr Marca Ancona Dr. Dorthula Nettles Dr. Clearnce Hasten Amy Filbert Schilder, NP Robbie Lis, [place] [facility] Bernie, [place] Brynda Peon, NP [place] Lee, NP Clarisa Kindred, NP Karle Plumber, PharmD Enos Fling, PharmD   Please be sure to bring in all your medications bottles to every appointment.    Thank you for choosing [facility]

## 2023-04-02 NOTE — Progress Notes (Signed)
 PCP: Rodrigo Ran, MD EP: Dr. Elberta Fortis HF Cardiology: Dr. Shirlee Latch  Chief complaint: CHF  83 y.o. 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  Patient had an echo done in 3/23 showing EF 65-70%, mild LVH, normal RV, PASP 60 mmHg, moderate biatrial enlargement, mild MR, moderate TR. Prior echoes had shown mildly elevated PA pressure. Cardiolite in 10/21 showed no ischemia.   RHC was done in 5/23 showing mild pulmonary venous hypertension with mildly elevated PCWP. She was started on Farxiga.  She developed a persistent UTI that was difficult to clear and stopped [place].   Acute follow up 02/20/22 for dyspnea. CTA chest showed no PE, moderate right pleural effusion, mild pulmonary edema. NYHA III symptoms and volume overloaded, she was given IV lasix 80 mg x 1 then instructed to use [name] daily x 2 days, then switch from Lasix to torsemide 60 mg daily. RHC arranged which showed elevated R/L heart filling pressures, prominent V waves in PCWP tracing, preserved CO and moderate pulmonary venous hypertension. Echo arranged to assess MR and torsemide increased to 60/40.  S/p R therapeutic thoracentesis 02/25/22 yielding 350 ml fluid.  Echo 2/24 showed EF 55-60%, normal RV, mild MR, moderate-severe TR with dilated IVC.  TEE in 3/24 showed EF 55-60%, mild RV enlargement with mildly decreased RV systolic function, mild MR, severe TR (PPM lead not fused to leaflets), small PFO.   Follow up with EP 06/23/22, device interrogation showing 8% burden of AT/AF. Planning to re-evaluate after possible TR intervention.  Saw Dr. Art Buff @ [facility] in 5/24 and had workup for percutaneous tricuspid interventions (Triclip, Evoque valve).  She was determined not to be a candidate for any of the available tricuspid interventions.    She saw Dr.  Elberta Fortis; he did not think she was a candidate for repeat AF ablation.   Today she returns for HF follow up with her husband. She is more fatigued and tired with exertion but thinks this is more related to Parkinsons than HF. She feels like her Parkinsons is worsening and making it harder for her to walk. No dyspnea walking on flat ground though she tires easily.  No chest pain.  No lightheadedness or syncope.  No palpitations.   ECG (personally reviewed): A-V paced  Medtronic device interrogation (personally reviewed): 1.9% AF, 92% v-pacing, 95% a-pacing.   Labs (3/23): hgb 9.7 Labs (4/23): K 3.2, creatinine 0.92 Labs (6/23): creatinine 0.8  Labs (11/23): K 3.5, creatinine 0.7 Labs (1/24): K 4.1, creatinine 0.85 Labs (2/24): K 3.7, creatinine 0.95 Labs (3/24): K 3.3, creatinine 0.93, hgb 10.8 Labs (4/24): K 4.0, creatinine 1.10 Labs (6/24): BNP 78 Labs (9/24): K 4.6, creatinine 1.02, LFTs normal Labs (10/24): K 4.3, creatinine 1.14 Labs (2/25): hgb 11.1, transferrin saturation 15%  PMH: 1. Atrial fibrillation: Paroxysmal.  S/p AF (epicardial and endocardial) ablation in 10/13.  2. Tachy-brady syndrome: Medtronic PPM placed in 9/14.  3. H/o atypical atrial flutter 4. Hyperlipidemia 5. HTN 6. Hypothyroidism 7. Parkinsons disease: Followed by Dr. Arbutus Leas 8. Cirrhosis: ?NAFLD. No ETOH.  9. Chronic diastolic CHF/pulmonary hypertension:  - LHC (2014): No significant CAD.  - Cardiolite (10/21): EF 71%, no ischemia.  - Echo (3/23): EF 65-70%, mild LVH, normal RV, PASP 60 mmHg, moderate biatrial enlargement, mild MR, moderate TR.  - RHC (5/23): mean RA 8, PA 49/13 mean 30, mean PCWP 19 with  v-waves to 30, CI 3.96, PVR 1.8 WU.  - RHC (2/24): RA mean 12, PA 53/24 (mean 39) PCWP mean 23 with prominent v-waves to 35, CO/CI (Fick) 6.75/4.23, PVR 2.4 WU - Echo (2/24): EF 55-60%, normal RV, mild MR, moderate-severe TR with dilated IVC. - TEE (3/24): EF 55-60%, mild RV enlargement with mildly  decreased RV systolic function, mild MR, severe TR (PPM lead not fused to leaflets), small PFO.  10. B12 deficiency.  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  - Seen at [facility] in [place], not candidate for percutaneous tricuspid repair.   Social History   Socioeconomic History   Marital status: Married    Spouse name: Not on file   Number of children: 2   Years of education: Not on file   Highest education level: Master's degree (e.g., MA, MS, MEng, MEd, MSW, MBA)  Occupational History   Not on file  Tobacco Use   Smoking status: Never   Smokeless tobacco: Never  Vaping Use   Vaping status: Never Used  Substance and Sexual Activity   Alcohol use: No   Drug use: No   Sexual activity: Yes    Partners: Male    Birth control/protection: Post-menopausal  Other Topics Concern   Not on file  Social History Narrative   Lives in [place].  Retired Comptroller.   Right handed    Social Drivers of Health   Financial Resource Strain: Not on file  Food Insecurity: Low Risk  (03/11/2022)   [facility]   Hunger Vital Sign    Worried About Running Out of Food in the Last Year: Never true    Ran Out of Food in the Last Year: Never true  Transportation Needs: Not on file (03/11/2022)  Physical Activity: Not on file  Stress: Not on file  Social Connections: Not on file  Intimate Partner Violence: Not on file   Family History  Problem Relation Age of Onset   Alzheimer's disease Mother    Heart failure Father    Healthy Child    Healthy Child    Breast cancer Maternal Aunt    ROS: All systems reviewed and negative except as per HPI.   Current Outpatient Medications  Medication Sig Dispense Refill   acetaminophen (TYLENOL) 500 MG tablet Take 1,000 mg by mouth at bedtime as needed for mild pain, moderate pain or  headache.     Acetylcysteine (NAC 600) 600 MG CAPS Take 600 mg by mouth in the morning.     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 60 tablet 5   B-D 3CC LUER-LOK SYR 25GX1" 25G X 1" 3 ML MISC Inject 1 mL into the muscle once a week.     BELSOMRA 20 MG TABS Take 20 mg by mouth at bedtime as needed (sleep).     Calcium Citrate-Vitamin D (CITRACAL + D PO) Take 1 tablet by mouth every evening.     carbidopa-levodopa (SINEMET IR) 25-100 MG tablet TAKE TWO TABLETS BY MOUTH THREE TIMES DAILY 540 tablet 0   Carbidopa-Levodopa ER (RYTARY) 36.25-145 MG CPCR Take 2 tablets by mouth at bedtime. 180 capsule 0   Carbidopa-Levodopa ER 23.75-95 MG CPCR Take by mouth.  cholecalciferol (VITAMIN D3) 25 MCG (1000 UNIT) tablet Take 1,000 Units by mouth in the morning.     cyanocobalamin (,VITAMIN B-12,) 1000 MCG/ML injection INJECT INTRAMUSCULARLY IN THE DELTOID ONCE A MONTH (ALTERNATING DELT. EACH MONTH)     Cyanocobalamin (VITAMIN B12) 1000 MCG TBCR Take 1,000 mcg by mouth in the morning.     docusate sodium (COLACE) 100 MG capsule Take 100 mg by mouth 2 (two) times daily.     GLUTATHIONE PO Take 2 capsules by mouth in the morning.     levothyroxine (SYNTHROID) 50 MCG tablet Take 50-100 mcg by mouth See admin instructions. Take 2 tablets (100 mcg) by mouth on Mondays and Thursdays in the morning. Take 1 tablet (50 mcg) by mouth on Sundays, Tuesdays, Wednesdays, Fridays & Saturdays.     magnesium oxide (MAG-OX) 400 MG tablet Take 400 mg by mouth at bedtime.     metoprolol succinate (TOPROL-XL) 25 MG 24 hr tablet TAKE ONE TABLET BY MOUTH EVERYDAY AT BEDTIME 90 tablet 2   multivitamin-lutein (OCUVITE-LUTEIN) CAPS capsule Take 1 capsule by mouth in the morning.     NON FORMULARY Take 1 tablet by mouth at bedtime. MetaCalm Stress Regulation Formula     polycarbophil (FIBERCON) 625 MG  tablet Take 1,250 mg by mouth in the morning.     potassium chloride (MICRO-K) 10 MEQ CR capsule TAKE 6 CAPSULES EVERY MORNING AND AT NOON 360 capsule 3   rasagiline (AZILECT) 1 MG TABS tablet TAKE ONE TABLET BY MOUTH ONCE DAILY 90 tablet 0   spironolactone (ALDACTONE) 25 MG tablet Take 2 tablets (50 mg total) by mouth daily. 180 tablet 3   torsemide (DEMADEX) 20 MG tablet Take 4 tablets (80 mg total) by mouth every morning AND 3 tablets (60 mg total) every evening. 300 tablet 3   valACYclovir (VALTREX) 1000 MG tablet Take 1,000 mg by mouth 2 (two) times daily as needed (fever blisters).     vitamin C (ASCORBIC ACID) 500 MG tablet Take 500 mg by mouth in the morning.     No current facility-administered medications for this encounter.   Facility-Administered Medications Ordered in Other Encounters  Medication Dose Route Frequency Provider Last Rate Last Admin   sodium chloride flush (NS) 0.9 % injection 10 mL  10 mL Intravenous Q12H Erisha, Paugh, PA-C       BP 124/70   Pulse 75   Wt 54.1 kg (119 lb 3.2 oz)   LMP 02/04/1996   SpO2 98%   BMI 22.52 kg/m   Wt Readings from Last 3 Encounters:  04/02/23 54.1 kg (119 lb 3.2 oz)  01/06/23 58.1 kg (128 lb)  12/30/22 57.2 kg (126 lb 3.2 oz)  General: NAD Neck: JVP 8 cm, no thyromegaly or thyroid nodule.  Lungs: Clear to auscultation bilaterally with normal respiratory effort. CV: Nondisplaced PMI.  Heart regular S1/S2, no S3/S4, soft 1/6 HSM apex.  No peripheral edema.  No carotid bruit.  Normal pedal pulses.  Abdomen: Soft, nontender, no hepatosplenomegaly, no distention.  Skin: Intact without lesions or rashes.  Neurologic: Alert and oriented x 3. Tremor.  Psych: Normal affect. Extremities: No clubbing or cyanosis.  HEENT: Normal.   Assessment/Plan: 1. Pulmonary hypertension:  RHC (1/24) showed moderate pulmonary venous hypertension with elevated PCWP (group 2 PH). 2. Chronic diastolic CHF: Echo in 3/23 with EF 65-70%, mild LVH,  normal RV, PASP 60 mmHg, moderate biatrial enlargement, mild MR, moderate TR. RHC in 5/23 showed elevated PCWP with prominent v-waves.  Echo  from 3/23 reviewed, no more than mild MR so suspect elevated v-waves due to diastolic dysfunction.  She had pulmonary venous hypertension. She underwent RHC again in 1/24 showing elevated R/L heart filling pressures, prominent V-waves in PCWP tracing, preserved CO and moderate pulmonary venous hypertension. Echo 2/24 showed stable EF 55-60%, normal RV, mild MR, moderate-severe TR with dilated IVC. TEE in 3/24 showed EF 55-60%, mild RV enlargement with mildly decreased RV systolic function, mild MR, severe TR (PPM lead not fused to leaflets), small PFO. NYHA class III chronically, also with symptoms due to Parkinsons. Not significantly volume overloaded by exam today.  - Continue torsemide 80 qam/60 qpm. BMET/BNP today. - Continue spironolactone 50 mg daily.   - Off Farxiga due to persistent UTI.  - I will arrange for repeat echo.  3. Tricuspid regurgitation: Echo 2/24 showed moderate to severe TR. TEE was done in 3/24, showing severe TR with ERO 0.43 cm^2 by PISA, regurgitant volume 50 cc, PPM does moves independently from leaflets and does not appear to be fused, there is mild RV dilation/mild RV dysfunction.  I suspect TR plays a significant role in her CHF.  She had evaluation at [facility] clinic and was determined not to be a suitable candidate for tricuspid valve percutaneous repair (Triclip or Evoque) even if we were to remove her pacemaker lead and use a leadless pacemaker instead. .  - Continue medical management.  4. Atrial fibrillation: Paroxysmal. She has had an epicardial and endocardial ablation in the past.  She failed Tikosyn in the past and did not tolerate amiodarone due to side effects.  She is in NSR today, device shows occasional short AF. Per EP, not candidate for redo ablation.  - Continue Eliquis.  - Continue Toprol XL.  5. Tachy-brady syndrome:  Has MDT PPM. Had gen change 07/17/21 6. Cirrhosis: ?NAFLD.  Does not drink any ETOH now, was not a heavy drinker before.  - Followed by [facility].  7. Anemia, chronic. She has Fe deficiency.  - Will give IV iron.   Follow up in 3 months with APP.   I spent 32 minutes reviewing records, interviewing/examining patient, and managing orders.   Marca Ancona  04/02/2023

## 2023-04-06 ENCOUNTER — Other Ambulatory Visit: Payer: Self-pay | Admitting: Neurology

## 2023-04-06 DIAGNOSIS — G20A1 Parkinson's disease without dyskinesia, without mention of fluctuations: Secondary | ICD-10-CM

## 2023-04-06 DIAGNOSIS — G20B1 Parkinson's disease with dyskinesia, without mention of fluctuations: Secondary | ICD-10-CM

## 2023-04-10 ENCOUNTER — Other Ambulatory Visit: Payer: Self-pay | Admitting: Neurology

## 2023-04-10 DIAGNOSIS — G20B1 Parkinson's disease with dyskinesia, without mention of fluctuations: Secondary | ICD-10-CM

## 2023-04-10 DIAGNOSIS — G20A1 Parkinson's disease without dyskinesia, without mention of fluctuations: Secondary | ICD-10-CM

## 2023-04-13 ENCOUNTER — Inpatient Hospital Stay: Payer: Medicare Other | Attending: Hematology and Oncology

## 2023-04-13 ENCOUNTER — Inpatient Hospital Stay (HOSPITAL_BASED_OUTPATIENT_CLINIC_OR_DEPARTMENT_OTHER): Payer: Medicare Other | Admitting: Hematology and Oncology

## 2023-04-13 VITALS — BP 113/55 | HR 70 | Temp 97.6°F | Resp 19 | Ht 61.0 in | Wt 119.0 lb

## 2023-04-13 DIAGNOSIS — Z7901 Long term (current) use of anticoagulants: Secondary | ICD-10-CM | POA: Insufficient documentation

## 2023-04-13 DIAGNOSIS — D649 Anemia, unspecified: Secondary | ICD-10-CM | POA: Insufficient documentation

## 2023-04-13 DIAGNOSIS — Z79899 Other long term (current) drug therapy: Secondary | ICD-10-CM | POA: Diagnosis not present

## 2023-04-13 DIAGNOSIS — K7460 Unspecified cirrhosis of liver: Secondary | ICD-10-CM | POA: Insufficient documentation

## 2023-04-13 LAB — CBC WITH DIFFERENTIAL (CANCER CENTER ONLY)
Abs Immature Granulocytes: 0.02 10*3/uL (ref 0.00–0.07)
Basophils Absolute: 0 10*3/uL (ref 0.0–0.1)
Basophils Relative: 1 %
Eosinophils Absolute: 0 10*3/uL (ref 0.0–0.5)
Eosinophils Relative: 0 %
HCT: 31.9 % — ABNORMAL LOW (ref 36.0–46.0)
Hemoglobin: 10.1 g/dL — ABNORMAL LOW (ref 12.0–15.0)
Immature Granulocytes: 0 %
Lymphocytes Relative: 9 %
Lymphs Abs: 0.5 10*3/uL — ABNORMAL LOW (ref 0.7–4.0)
MCH: 30.8 pg (ref 26.0–34.0)
MCHC: 31.7 g/dL (ref 30.0–36.0)
MCV: 97.3 fL (ref 80.0–100.0)
Monocytes Absolute: 0.5 10*3/uL (ref 0.1–1.0)
Monocytes Relative: 9 %
Neutro Abs: 4.8 10*3/uL (ref 1.7–7.7)
Neutrophils Relative %: 81 %
Platelet Count: 220 10*3/uL (ref 150–400)
RBC: 3.28 MIL/uL — ABNORMAL LOW (ref 3.87–5.11)
RDW: 15.9 % — ABNORMAL HIGH (ref 11.5–15.5)
WBC Count: 5.9 10*3/uL (ref 4.0–10.5)
nRBC: 0 % (ref 0.0–0.2)

## 2023-04-13 LAB — IRON AND IRON BINDING CAPACITY (CC-WL,HP ONLY)
Iron: 51 ug/dL (ref 28–170)
Saturation Ratios: 16 % (ref 10.4–31.8)
TIBC: 321 ug/dL (ref 250–450)
UIBC: 270 ug/dL (ref 148–442)

## 2023-04-13 LAB — FERRITIN: Ferritin: 50 ng/mL (ref 11–307)

## 2023-04-13 NOTE — Assessment & Plan Note (Signed)
 Lab review: 06/11/2021: Hemoglobin 10.3, MCV 88.2 02/21/2022: Hemoglobin 8.3, MCV 101, ferritin 88, creatinine 1.08, iron saturation 13%, TIBC 406 03/06/2022: Hemoglobin 8.7, MCV 96, RDW 15.9 04/21/2022: Hemoglobin 10.8, MCV 89.3 05/27/2022: Hemoglobin 10.5 12/17/2022: Hemoglobin 9.1, MCV 78.5 (liver biopsy: Cirrhosis)   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 No reported adverse effects. -Continue Eliquis at reduced dose. -Monitor for signs of bleeding or clotting.

## 2023-04-13 NOTE — Progress Notes (Signed)
 Patient Care Team: Rodrigo Ran, MD as PCP - General (Internal Medicine) Nahser, Deloris Ping, MD as PCP - Cardiology (Cardiology) Regan Lemming, MD as PCP - Electrophysiology (Cardiology) Elenor Legato, MD as Referring Physician (Cardiothoracic Surgery) Charlette Caffey, MD as Consulting Physician (Internal Medicine) Tat, Octaviano Batty, DO as Consulting Physician (Neurology)  DIAGNOSIS:  Encounter Diagnosis  Name Primary?   Normocytic anemia Yes    SUMMARY OF ONCOLOGIC HISTORY: Oncology History   No history exists.    CHIEF COMPLIANT:   HISTORY OF PRESENT ILLNESS: Discussed the use of AI scribe software for clinical note transcription with the patient, who gave verbal consent to proceed.  History of Present Illness The patient, with a history of liver cirrhosis, presents for a follow-up visit due to a decrease in hemoglobin levels over the past few months. The patient's hemoglobin level has dropped from 12 to 10.1 over the past three months. The patient denies any noticeable bleeding or changes in stool color. The patient's iron levels are currently being evaluated, with one test result still pending. The patient's dietary intake has reportedly decreased, which may be contributing to the anemia. The patient's energy levels have also been noted to be low. The patient has a history of anemia, with hemoglobin levels as low as 8.8 nine months ago.     ALLERGIES:  is allergic to amiodarone and statins.  MEDICATIONS:  Current Outpatient Medications  Medication Sig Dispense Refill   Acetylcysteine (NAC 600) 600 MG CAPS Take 600 mg by mouth in the morning.     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 60 tablet 5   B-D 3CC LUER-LOK SYR 25GX1" 25G X 1" 3 ML MISC Inject 1 mL into the muscle once a week.     BELSOMRA 20 MG TABS Take 20 mg by mouth at bedtime as  needed (sleep).     Calcium Citrate-Vitamin D (CITRACAL + D PO) Take 1 tablet by mouth every evening.     carbidopa-levodopa (SINEMET IR) 25-100 MG tablet TAKE TWO TABLETS THREE TIMES DAILY 540 tablet 0   Carbidopa-Levodopa ER (RYTARY) 36.25-145 MG CPCR Take 2 tablets by mouth at bedtime. 180 capsule 0   cholecalciferol (VITAMIN D3) 25 MCG (1000 UNIT) tablet Take 1,000 Units by mouth in the morning.     cyanocobalamin (,VITAMIN B-12,) 1000 MCG/ML injection INJECT INTRAMUSCULARLY IN THE DELTOID ONCE A MONTH (ALTERNATING DELT. EACH MONTH)     Cyanocobalamin (VITAMIN B12) 1000 MCG TBCR Take 1,000 mcg by mouth in the morning.     docusate sodium (COLACE) 100 MG capsule Take 100 mg by mouth 2 (two) times daily.     GLUTATHIONE PO Take 2 capsules by mouth in the morning.     levothyroxine (SYNTHROID) 50 MCG tablet Take 50-100 mcg by mouth See admin instructions. Take 2 tablets (100 mcg) by mouth on Mondays and Thursdays in the morning. Take 1 tablet (50 mcg) by mouth on Sundays, Tuesdays, Wednesdays, Fridays & Saturdays.     magnesium oxide (MAG-OX) 400 MG tablet Take 400 mg by mouth at bedtime.     metoprolol succinate (TOPROL-XL) 25 MG 24 hr tablet TAKE ONE TABLET BY MOUTH EVERYDAY AT BEDTIME 90 tablet 2   multivitamin-lutein (OCUVITE-LUTEIN) CAPS capsule Take 1 capsule by mouth in the morning.     NON FORMULARY Take 1 tablet by  mouth at bedtime. MetaCalm Stress Regulation Formula     polycarbophil (FIBERCON) 625 MG tablet Take 1,250 mg by mouth in the morning.     potassium chloride (MICRO-K) 10 MEQ CR capsule TAKE 6 CAPSULES EVERY MORNING AND AT NOON 360 capsule 3   rasagiline (AZILECT) 1 MG TABS tablet TAKE ONE TABLET BY MOUTH ONCE DAILY 90 tablet 0   spironolactone (ALDACTONE) 25 MG tablet Take 2 tablets (50 mg total) by mouth daily. 180 tablet 3   torsemide (DEMADEX) 20 MG tablet Take 4 tablets (80 mg total) by mouth every morning AND 3 tablets (60 mg total) every evening. 300 tablet 3    valACYclovir (VALTREX) 1000 MG tablet Take 1,000 mg by mouth 2 (two) times daily as needed (fever blisters).     vitamin C (ASCORBIC ACID) 500 MG tablet Take 500 mg by mouth in the morning.     acetaminophen (TYLENOL) 500 MG tablet Take 1,000 mg by mouth at bedtime as needed for mild pain, moderate pain or headache. (Patient not taking: Reported on 04/13/2023)     Carbidopa-Levodopa ER 23.75-95 MG CPCR Take by mouth. (Patient not taking: Reported on 04/13/2023)     No current facility-administered medications for this visit.   Facility-Administered Medications Ordered in Other Visits  Medication Dose Route Frequency Provider Last Rate Last Admin   sodium chloride flush (NS) 0.9 % injection 10 mL  10 mL Intravenous Q12H Ralene Muskrat, PA-C        PHYSICAL EXAMINATION: ECOG PERFORMANCE STATUS: 1 - Symptomatic but completely ambulatory  Vitals:   04/13/23 1507 04/13/23 1509  BP: (!) 97/44 (!) 113/55  Pulse: 70   Resp: 19   Temp: 97.6 F (36.4 C)   SpO2: 100%    Filed Weights   04/13/23 1507  Weight: 119 lb (54 kg)    Physical Exam    (exam performed in the presence of a chaperone)  LABORATORY DATA:  I have reviewed the data as listed    Latest Ref Rng & Units 04/02/2023   12:38 PM 11/26/2022   10:12 AM 11/06/2022   11:10 AM  CMP  Glucose 70 - 99 mg/dL 161  89  096   BUN 8 - 23 mg/dL 21  21  24    Creatinine 0.44 - 1.00 mg/dL 0.45  4.09  8.11   Sodium 135 - 145 mmol/L 137  131  132   Potassium 3.5 - 5.1 mmol/L 4.0  4.6  4.3   Chloride 98 - 111 mmol/L 98  95  95   CO2 22 - 32 mmol/L 29  29  27    Calcium 8.9 - 10.3 mg/dL 9.5  9.6  9.2     Lab Results  Component Value Date   WBC 5.9 04/13/2023   HGB 10.1 (L) 04/13/2023   HCT 31.9 (L) 04/13/2023   MCV 97.3 04/13/2023   PLT 220 04/13/2023   NEUTROABS 4.8 04/13/2023    ASSESSMENT & PLAN:  Normocytic anemia Lab review: 06/11/2021: Hemoglobin 10.3, MCV 88.2 02/21/2022: Hemoglobin 8.3, MCV 101, ferritin 88, creatinine  1.08, iron saturation 13%, TIBC 406 03/06/2022: Hemoglobin 8.7, MCV 96, RDW 15.9 04/21/2022: Hemoglobin 10.8, MCV 89.3 05/27/2022: Hemoglobin 10.5 12/17/2022: Hemoglobin 9.1, MCV 78.5 (liver biopsy: Cirrhosis)   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 No reported adverse effects. -Continue Eliquis at reduced dose. -Monitor for signs of bleeding or clotting. ------------------------------------- Assessment and Plan Assessment & Plan Anemia Hemoglobin decreased from 12 to 10.1 over the past 2 months. No overt bleeding. Iron studies pending. Possible causes include decreased bone marrow production due to cirrhosis or occult bleeding. -Call patient with pending iron study results. -If iron deficiency is confirmed, consider iron supplementation. -If not iron deficient, monitor hemoglobin levels. -If hemoglobin drops below 8, consider blood transfusion. -Encourage increased dietary protein intake. -Recheck hemoglobin in 2 months.  Cirrhosis Possible impact on bone marrow production contributing to anemia. -Continue current management.  Anticoagulation On blood thinner, possible small bowel leaks contributing to anemia. -Continue current management.      Orders Placed This Encounter  Procedures   CBC with Differential ([facility] Only)    Standing Status:   Future    Expiration Date:   04/12/2024   CMP ([facility] Center only)    Standing Status:   Future    Expiration Date:   04/12/2024   Ferritin    Standing Status:   Future    Expiration Date:   04/12/2024   Iron and Iron Binding Capacity ([facility] only)    Standing Status:   Future    Expiration Date:   04/12/2024   Sample to Blood Bank    Standing Status:   Future    Expiration  Date:   04/12/2024   The patient has a good understanding of the overall plan. she agrees with it. she will call with any problems that may develop before the next visit here. Total time spent: 30 mins including face to face time and time spent for planning, charting and co-ordination of care   Tamsen Meek, MD 04/13/23     Patient Care Team: Rodrigo Ran, MD as PCP - General (Internal Medicine) Nahser, Deloris Ping, MD as PCP - Cardiology (Cardiology) Regan Lemming, MD as PCP - Electrophysiology (Cardiology) Elenor Legato, MD as Referring Physician (Cardiothoracic Surgery) Charlette Caffey, MD as Consulting Physician (Internal Medicine) Tat, Octaviano Batty, DO as Consulting Physician (Neurology)  DIAGNOSIS:  Encounter Diagnosis  Name Primary?   Normocytic anemia Yes    SUMMARY OF ONCOLOGIC HISTORY: Oncology History   No history exists.    CHIEF COMPLIANT:   HISTORY OF PRESENT ILLNESS: Discussed the use of AI scribe software for clinical note transcription with the patient, who gave verbal consent to proceed.  History of Present Illness The patient, with a history of liver cirrhosis, presents for a follow-up visit due to a decrease in hemoglobin levels over the past few months. The patient's hemoglobin level has dropped from 12 to 10.1 over the past three months. The patient denies any noticeable bleeding or changes in stool color. The patient's iron levels are currently being evaluated, with one test result still pending. The patient's dietary intake has reportedly decreased, which may be contributing to the anemia. The patient's energy levels have also been noted to be low. The patient has a history of anemia, with hemoglobin levels as low as 8.8 nine months ago.     ALLERGIES:  is allergic to amiodarone and statins.  MEDICATIONS:  Current Outpatient Medications  Medication Sig Dispense Refill   Acetylcysteine (NAC 600) 600 MG CAPS Take 600 mg by mouth in the morning.      AMBULATORY NON FORMULARY MEDICATION Rollator Dx: G20.B2 1 Device 0  apixaban (ELIQUIS) 2.5 MG TABS tablet Take 1 tablet (2.5 mg total) by mouth 2 (two) times daily. 60 tablet 5   B-D 3CC LUER-LOK SYR 25GX1" 25G X 1" 3 ML MISC Inject 1 mL into the muscle once a week.     BELSOMRA 20 MG TABS Take 20 mg by mouth at bedtime as needed (sleep).     Calcium Citrate-Vitamin D (CITRACAL + D PO) Take 1 tablet by mouth every evening.     carbidopa-levodopa (SINEMET IR) 25-100 MG tablet TAKE TWO TABLETS THREE TIMES DAILY 540 tablet 0   Carbidopa-Levodopa ER (RYTARY) 36.25-145 MG CPCR Take 2 tablets by mouth at bedtime. 180 capsule 0   cholecalciferol (VITAMIN D3) 25 MCG (1000 UNIT) tablet Take 1,000 Units by mouth in the morning.     cyanocobalamin (,VITAMIN B-12,) 1000 MCG/ML injection INJECT INTRAMUSCULARLY IN THE DELTOID ONCE A MONTH (ALTERNATING DELT. EACH MONTH)     Cyanocobalamin (VITAMIN B12) 1000 MCG TBCR Take 1,000 mcg by mouth in the morning.     docusate sodium (COLACE) 100 MG capsule Take 100 mg by mouth 2 (two) times daily.     GLUTATHIONE PO Take 2 capsules by mouth in the morning.     levothyroxine (SYNTHROID) 50 MCG tablet Take 50-100 mcg by mouth See admin instructions. Take 2 tablets (100 mcg) by mouth on Mondays and Thursdays in the morning. Take 1 tablet (50 mcg) by mouth on Sundays, Tuesdays, Wednesdays, Fridays & Saturdays.     magnesium oxide (MAG-OX) 400 MG tablet Take 400 mg by mouth at bedtime.     metoprolol succinate (TOPROL-XL) 25 MG 24 hr tablet TAKE ONE TABLET BY MOUTH EVERYDAY AT BEDTIME 90 tablet 2   multivitamin-lutein (OCUVITE-LUTEIN) CAPS capsule Take 1 capsule by mouth in the morning.     NON FORMULARY Take 1 tablet by mouth at bedtime. MetaCalm Stress Regulation Formula     polycarbophil (FIBERCON) 625 MG tablet Take 1,250 mg by mouth in the morning.     potassium chloride (MICRO-K) 10 MEQ CR capsule TAKE 6 CAPSULES EVERY MORNING AND AT NOON 360 capsule 3    rasagiline (AZILECT) 1 MG TABS tablet TAKE ONE TABLET BY MOUTH ONCE DAILY 90 tablet 0   spironolactone (ALDACTONE) 25 MG tablet Take 2 tablets (50 mg total) by mouth daily. 180 tablet 3   torsemide (DEMADEX) 20 MG tablet Take 4 tablets (80 mg total) by mouth every morning AND 3 tablets (60 mg total) every evening. 300 tablet 3   valACYclovir (VALTREX) 1000 MG tablet Take 1,000 mg by mouth 2 (two) times daily as needed (fever blisters).     vitamin C (ASCORBIC ACID) 500 MG tablet Take 500 mg by mouth in the morning.     acetaminophen (TYLENOL) 500 MG tablet Take 1,000 mg by mouth at bedtime as needed for mild pain, moderate pain or headache. (Patient not taking: Reported on 04/13/2023)     Carbidopa-Levodopa ER 23.75-95 MG CPCR Take by mouth. (Patient not taking: Reported on 04/13/2023)     No current facility-administered medications for this visit.   Facility-Administered Medications Ordered in Other Visits  Medication Dose Route Frequency Provider Last Rate Last Admin   sodium chloride flush (NS) 0.9 % injection 10 mL  10 mL Intravenous Q12H Ralene Muskrat, PA-C        PHYSICAL EXAMINATION: ECOG PERFORMANCE STATUS: 1 - Symptomatic but completely ambulatory  Vitals:   04/13/23 1507 04/13/23 1509  BP: (!) 97/44 (!) 113/55  Pulse: 70  Resp: 19   Temp: 97.6 F (36.4 C)   SpO2: 100%    Filed Weights   04/13/23 1507  Weight: 119 lb (54 kg)    Physical Exam   (exam performed in the presence of a chaperone)  LABORATORY DATA:  I have reviewed the data as listed    Latest Ref Rng & Units 04/02/2023   12:38 PM 11/26/2022   10:12 AM 11/06/2022   11:10 AM  CMP  Glucose 70 - 99 mg/dL 409  89  811   BUN 8 - 23 mg/dL 21  21  24    Creatinine 0.44 - 1.00 mg/dL 9.14  7.82  9.56   Sodium 135 - 145 mmol/L 137  131  132   Potassium 3.5 - 5.1 mmol/L 4.0  4.6  4.3   Chloride 98 - 111 mmol/L 98  95  95   CO2 22 - 32 mmol/L 29  29  27    Calcium 8.9 - 10.3 mg/dL 9.5  9.6  9.2     Lab  Results  Component Value Date   WBC 5.9 04/13/2023   HGB 10.1 (L) 04/13/2023   HCT 31.9 (L) 04/13/2023   MCV 97.3 04/13/2023   PLT 220 04/13/2023   NEUTROABS 4.8 04/13/2023    ASSESSMENT & PLAN:  Normocytic anemia Lab review: 06/11/2021: Hemoglobin 10.3, MCV 88.2 02/21/2022: Hemoglobin 8.3, MCV 101, ferritin 88, creatinine 1.08, iron saturation 13%, TIBC 406 03/06/2022: Hemoglobin 8.7, MCV 96, RDW 15.9 04/21/2022: Hemoglobin 10.8, MCV 89.3 05/27/2022: Hemoglobin 10.5 12/17/2022: Hemoglobin 9.1, MCV 78.5 (liver biopsy: Cirrhosis) 04/13/23: Hemoglobin 10.1   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 No reported adverse effects. -Continue Eliquis at reduced dose. -Monitor for signs of bleeding or clotting. ------------------------------------- Assessment and Plan Assessment & Plan Anemia Hemoglobin decreased from 12 to 10.1 over the past 2 months. No overt bleeding. Iron studies pending. Possible causes include decreased bone marrow production due to cirrhosis or occult bleeding. -Call patient with pending iron study results. -If iron deficiency is confirmed, consider iron supplementation. -If not iron deficient, monitor hemoglobin levels. -If hemoglobin drops below 8, consider blood transfusion. -Encourage increased dietary protein intake. -Recheck hemoglobin in 2 months.  Cirrhosis Possible impact on bone marrow production contributing to anemia. -Continue current management.  Anticoagulation On blood thinner, possible small bowel leaks contributing to anemia. -Continue current management.      No orders of the defined types were placed in this encounter.  The patient has a good understanding of the overall plan. she agrees with  it. she will call with any problems that may develop before the next visit here. Total time spent: 30 mins including face to face time and time spent for planning, charting and co-ordination of care   Tamsen Meek, MD 04/13/23

## 2023-04-14 ENCOUNTER — Telehealth: Payer: Self-pay | Admitting: Hematology and Oncology

## 2023-04-14 ENCOUNTER — Encounter: Payer: Self-pay | Admitting: Cardiology

## 2023-04-14 NOTE — Telephone Encounter (Signed)
 I left a voicemail for the patient that her ferritin level is 50. No need of IV iron treatment.

## 2023-04-15 ENCOUNTER — Other Ambulatory Visit: Payer: Self-pay | Admitting: Nurse Practitioner

## 2023-04-15 DIAGNOSIS — M6289 Other specified disorders of muscle: Secondary | ICD-10-CM | POA: Diagnosis not present

## 2023-04-15 DIAGNOSIS — I959 Hypotension, unspecified: Secondary | ICD-10-CM | POA: Diagnosis not present

## 2023-04-15 DIAGNOSIS — R188 Other ascites: Secondary | ICD-10-CM | POA: Diagnosis not present

## 2023-04-15 DIAGNOSIS — D376 Neoplasm of uncertain behavior of liver, gallbladder and bile ducts: Secondary | ICD-10-CM

## 2023-04-15 DIAGNOSIS — Z789 Other specified health status: Secondary | ICD-10-CM | POA: Diagnosis not present

## 2023-04-15 DIAGNOSIS — K7469 Other cirrhosis of liver: Secondary | ICD-10-CM | POA: Diagnosis not present

## 2023-04-16 ENCOUNTER — Ambulatory Visit: Payer: Medicare Other

## 2023-04-16 DIAGNOSIS — I495 Sick sinus syndrome: Secondary | ICD-10-CM

## 2023-04-17 LAB — CUP PACEART REMOTE DEVICE CHECK
Battery Remaining Longevity: 113 mo
Battery Voltage: 3.01 V
Brady Statistic AP VP Percent: 87.96 %
Brady Statistic AP VS Percent: 5.89 %
Brady Statistic AS VP Percent: 1.71 %
Brady Statistic AS VS Percent: 4.45 %
Brady Statistic RA Percent Paced: 97.47 %
Brady Statistic RV Percent Paced: 89.71 %
Date Time Interrogation Session: 20250313160646
Implantable Lead Connection Status: 753985
Implantable Lead Connection Status: 753985
Implantable Lead Implant Date: 20140917
Implantable Lead Implant Date: 20140917
Implantable Lead Location: 753859
Implantable Lead Location: 753860
Implantable Pulse Generator Implant Date: 20230614
Lead Channel Impedance Value: 361 Ohm
Lead Channel Impedance Value: 380 Ohm
Lead Channel Impedance Value: 456 Ohm
Lead Channel Impedance Value: 475 Ohm
Lead Channel Pacing Threshold Amplitude: 0.75 V
Lead Channel Pacing Threshold Amplitude: 0.875 V
Lead Channel Pacing Threshold Pulse Width: 0.4 ms
Lead Channel Pacing Threshold Pulse Width: 0.4 ms
Lead Channel Sensing Intrinsic Amplitude: 0.25 mV
Lead Channel Sensing Intrinsic Amplitude: 0.25 mV
Lead Channel Sensing Intrinsic Amplitude: 18.375 mV
Lead Channel Sensing Intrinsic Amplitude: 18.375 mV
Lead Channel Setting Pacing Amplitude: 1.5 V
Lead Channel Setting Pacing Amplitude: 2 V
Lead Channel Setting Pacing Pulse Width: 0.4 ms
Lead Channel Setting Sensing Sensitivity: 1.2 mV
Zone Setting Status: 755011
Zone Setting Status: 755011

## 2023-04-20 NOTE — Progress Notes (Deleted)
 Assessment/Plan:   1.  Parkinsons Disease  - continue carbidopa/levodopa 25/100, 2 tablets three times per day and move dosages to 8am/noon/4pm  -*** rytary 145 mg, 2 po q hs.  Call me and let me know how it does  -Continue rasagiline, 1 mg daily  -Has moderate Parkinson's dyskinesia.  She doesn't want to look at rytary (during day)/crexont or add amantadine.    -PT referral  2.  RBD/insomnia  -she is no longer on klonopin but has noted that dreaming vivid again.  Told her dreams are back as klonopin was d/c when PCP started belsomra.  She forgot about that but is going to mention to pcp although is sleeping better.  PDMP reviewed 3.  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 4.  Atrial fibrillation  -Follows with cardiology.  On Eliquis.  -Patient with history of tachybradycardia syndrome and follows with cardiology for this as well.  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   Subjective:   [name] [name] was seen today in follow up for Parkinsons disease.  My previous records were reviewed prior to todays visit as well as outside records available to me.  She is with her husband who supplements the history.  She is still on the immediate release in the daytime, but we switched her to Rytary at night for the cramping.  She seems to be doing better with this.  She did call us on February 20 after a fall.  She fell backwards and hit the back part of her head.  She had been having some nausea and memory loss after this.  She was able to get seen at urgent care that day, at our request.  A CT brain was ordered and it was reported to be nonacute.   Current prescribed movement disorder medications: Carbidopa/levodopa 25/100, 2 tablets 3 times per day  Rytary 145 mg, 2 tablets at night azilect  Prior meds: Carbidopa/levodopa 50/200 CR at bed (did not help with cramping at night)  ALLERGIES:   Allergies  Allergen Reactions   Amiodarone  Other (See Comments)    Made pt feel terrible    Statins Other (See Comments)    Leg weakness - but tolerating low dose Crestor every other day    CURRENT MEDICATIONS:  Outpatient Encounter Medications as of 04/21/2023  Medication Sig   acetaminophen (TYLENOL) 500 MG tablet Take 1,000 mg by mouth at bedtime as needed for mild pain, moderate pain or headache. (Patient not taking: Reported on 04/13/2023)   Acetylcysteine (NAC 600) 600 MG CAPS Take 600 mg by mouth in the morning.   AMBULATORY NON FORMULARY MEDICATION Rollator Dx: G20.B2   apixaban (ELIQUIS) 2.5 MG TABS tablet Take 1 tablet (2.5 mg total) by mouth 2 (two) times daily.   B-D 3CC LUER-LOK SYR 25GX1" 25G X 1" 3 ML MISC Inject 1 mL into the muscle once a week.   BELSOMRA 20 MG TABS Take 20 mg by mouth at bedtime as needed (sleep).   Calcium Citrate-Vitamin D (CITRACAL + D PO) Take 1 tablet by mouth every evening.   carbidopa-levodopa (SINEMET IR) 25-100 MG tablet TAKE TWO TABLETS THREE TIMES DAILY   Carbidopa-Levodopa ER (RYTARY) 36.25-145 MG CPCR Take 2 tablets by mouth at bedtime.   Carbidopa-Levodopa ER 23.75-95 MG CPCR Take by mouth. (Patient not taking: Reported on 04/13/2023)   cholecalciferol (VITAMIN D3) 25 MCG (1000 UNIT) tablet Take 1,000 Units by mouth in the  morning.   cyanocobalamin (,VITAMIN B-12,) 1000 MCG/ML injection INJECT INTRAMUSCULARLY IN THE DELTOID ONCE A MONTH (ALTERNATING DELT. EACH MONTH)   Cyanocobalamin (VITAMIN B12) 1000 MCG TBCR Take 1,000 mcg by mouth in the morning.   docusate sodium (COLACE) 100 MG capsule Take 100 mg by mouth 2 (two) times daily.   GLUTATHIONE PO Take 2 capsules by mouth in the morning.   levothyroxine (SYNTHROID) 50 MCG tablet Take 50-100 mcg by mouth See admin instructions. Take 2 tablets (100 mcg) by mouth on Mondays and Thursdays in the morning. Take 1 tablet (50 mcg) by mouth on Sundays, Tuesdays, Wednesdays, Fridays & Saturdays.   magnesium oxide (MAG-OX) 400 MG tablet Take  400 mg by mouth at bedtime.   metoprolol succinate (TOPROL-XL) 25 MG 24 hr tablet TAKE ONE TABLET BY MOUTH EVERYDAY AT BEDTIME   multivitamin-lutein (OCUVITE-LUTEIN) CAPS capsule Take 1 capsule by mouth in the morning.   NON FORMULARY Take 1 tablet by mouth at bedtime. MetaCalm Stress Regulation Formula   polycarbophil (FIBERCON) 625 MG tablet Take 1,250 mg by mouth in the morning.   potassium chloride (MICRO-K) 10 MEQ CR capsule TAKE 6 CAPSULES EVERY MORNING AND AT NOON   rasagiline (AZILECT) 1 MG TABS tablet TAKE ONE TABLET BY MOUTH ONCE DAILY   spironolactone (ALDACTONE) 25 MG tablet Take 2 tablets (50 mg total) by mouth daily.   torsemide (DEMADEX) 20 MG tablet Take 4 tablets (80 mg total) by mouth every morning AND 3 tablets (60 mg total) every evening.   valACYclovir (VALTREX) 1000 MG tablet Take 1,000 mg by mouth 2 (two) times daily as needed (fever blisters).   vitamin C (ASCORBIC ACID) 500 MG tablet Take 500 mg by mouth in the morning.   Facility-Administered Encounter Medications as of 04/21/2023  Medication   sodium chloride flush (NS) 0.9 % injection 10 mL    Objective:   PHYSICAL EXAMINATION:    VITALS:   There were no vitals filed for this visit.   Wt Readings from Last 3 Encounters:  04/13/23 119 lb (54 kg)  04/02/23 119 lb 3.2 oz (54.1 kg)  01/06/23 128 lb (58.1 kg)   GEN:  The patient appears stated age and is in NAD. HEENT:  Normocephalic.  Area of healing ecchymosis on the R eye, in various stages of healing.  The mucous membranes are moist. The superficial temporal arteries are without ropiness or tenderness.   Neurological examination:  Orientation: The patient is alert and oriented x3. Cranial nerves: There is good facial symmetry with facial hypomimia. The speech is fluent and clear. Soft palate rises symmetrically and there is no tongue deviation. Hearing is intact to conversational tone. Sensation: Sensation is intact to light touch throughout Motor:  Strength is at least antigravity x4.  Movement examination: Tone: There is nl tone in the ue/le Abnormal movements: at least mod dyskinesia in the L>>R leg Coordination:  There is mild to mod dyskinesia in the legs Gait and Station: The patient's stride length is good.  Ambulates with good speed and good arm swing.  The patient has a neg pull test.     Total time spent on today's visit was *** minutes, including both face-to-face time and nonface-to-face time.  Time included that spent on review of records (prior notes available to me/labs/imaging if pertinent), discussing treatment and goals, answering patient's questions and coordinating care.  Cc:  Rodrigo Ran, MD

## 2023-04-21 ENCOUNTER — Ambulatory Visit: Payer: Medicare Other | Admitting: Neurology

## 2023-04-25 ENCOUNTER — Other Ambulatory Visit: Payer: Self-pay | Admitting: Cardiology

## 2023-04-25 ENCOUNTER — Other Ambulatory Visit (HOSPITAL_COMMUNITY): Payer: Self-pay | Admitting: Family Medicine

## 2023-04-28 ENCOUNTER — Ambulatory Visit: Payer: Self-pay | Admitting: Neurology

## 2023-04-28 ENCOUNTER — Ambulatory Visit
Admission: RE | Admit: 2023-04-28 | Discharge: 2023-04-28 | Disposition: A | Discharge status: Home / Self Care | Source: Ambulatory Visit | Attending: Nurse Practitioner | Admitting: Nurse Practitioner

## 2023-04-28 DIAGNOSIS — K769 Liver disease, unspecified: Secondary | ICD-10-CM | POA: Diagnosis not present

## 2023-04-28 DIAGNOSIS — D376 Neoplasm of uncertain behavior of liver, gallbladder and bile ducts: Secondary | ICD-10-CM

## 2023-04-28 DIAGNOSIS — K7469 Other cirrhosis of liver: Secondary | ICD-10-CM

## 2023-04-28 DIAGNOSIS — I700 Atherosclerosis of aorta: Secondary | ICD-10-CM | POA: Diagnosis not present

## 2023-04-28 DIAGNOSIS — K7460 Unspecified cirrhosis of liver: Secondary | ICD-10-CM | POA: Diagnosis not present

## 2023-04-28 MED ORDER — IOPAMIDOL (ISOVUE-300) INJECTION 61%
100.0000 mL | Freq: Once | INTRAVENOUS | Status: AC | PRN
  Administered 2023-04-28: 100 mL via INTRAVENOUS

## 2023-05-01 ENCOUNTER — Ambulatory Visit (HOSPITAL_COMMUNITY)
Admission: RE | Admit: 2023-05-01 | Discharge: 2023-05-01 | Disposition: A | Discharge status: Home / Self Care | Payer: Medicare Other | Source: Ambulatory Visit | Attending: Internal Medicine | Admitting: Internal Medicine

## 2023-05-01 DIAGNOSIS — E785 Hyperlipidemia, unspecified: Secondary | ICD-10-CM | POA: Diagnosis not present

## 2023-05-01 DIAGNOSIS — G20A1 Parkinson's disease without dyskinesia, without mention of fluctuations: Secondary | ICD-10-CM | POA: Diagnosis not present

## 2023-05-01 DIAGNOSIS — I110 Hypertensive heart disease with heart failure: Secondary | ICD-10-CM | POA: Insufficient documentation

## 2023-05-01 DIAGNOSIS — I272 Pulmonary hypertension, unspecified: Secondary | ICD-10-CM | POA: Insufficient documentation

## 2023-05-01 DIAGNOSIS — I5032 Chronic diastolic (congestive) heart failure: Secondary | ICD-10-CM | POA: Diagnosis not present

## 2023-05-01 DIAGNOSIS — Z950 Presence of cardiac pacemaker: Secondary | ICD-10-CM | POA: Diagnosis not present

## 2023-05-03 LAB — ECHOCARDIOGRAM COMPLETE
Area-P 1/2: 3.97 cm2
Calc EF: 61.3 %
MV VTI: 1.66 cm2
S' Lateral: 2.7 cm
Single Plane A2C EF: 57.3 %
Single Plane A4C EF: 67.7 %

## 2023-05-04 ENCOUNTER — Other Ambulatory Visit (HOSPITAL_COMMUNITY): Payer: Medicare Other

## 2023-05-14 DIAGNOSIS — E039 Hypothyroidism, unspecified: Secondary | ICD-10-CM | POA: Diagnosis not present

## 2023-05-14 DIAGNOSIS — D6869 Other thrombophilia: Secondary | ICD-10-CM | POA: Diagnosis not present

## 2023-05-14 DIAGNOSIS — I110 Hypertensive heart disease with heart failure: Secondary | ICD-10-CM | POA: Diagnosis not present

## 2023-05-14 DIAGNOSIS — Z950 Presence of cardiac pacemaker: Secondary | ICD-10-CM | POA: Diagnosis not present

## 2023-05-14 DIAGNOSIS — I5032 Chronic diastolic (congestive) heart failure: Secondary | ICD-10-CM | POA: Diagnosis not present

## 2023-05-14 DIAGNOSIS — I480 Paroxysmal atrial fibrillation: Secondary | ICD-10-CM | POA: Diagnosis not present

## 2023-05-14 DIAGNOSIS — E538 Deficiency of other specified B group vitamins: Secondary | ICD-10-CM | POA: Diagnosis not present

## 2023-05-14 DIAGNOSIS — D649 Anemia, unspecified: Secondary | ICD-10-CM | POA: Diagnosis not present

## 2023-05-14 DIAGNOSIS — M810 Age-related osteoporosis without current pathological fracture: Secondary | ICD-10-CM | POA: Diagnosis not present

## 2023-05-14 DIAGNOSIS — G20A1 Parkinson's disease without dyskinesia, without mention of fluctuations: Secondary | ICD-10-CM | POA: Diagnosis not present

## 2023-05-14 DIAGNOSIS — K7460 Unspecified cirrhosis of liver: Secondary | ICD-10-CM | POA: Diagnosis not present

## 2023-05-14 DIAGNOSIS — R7301 Impaired fasting glucose: Secondary | ICD-10-CM | POA: Diagnosis not present

## 2023-05-25 DIAGNOSIS — H353221 Exudative age-related macular degeneration, left eye, with active choroidal neovascularization: Secondary | ICD-10-CM | POA: Diagnosis not present

## 2023-05-25 DIAGNOSIS — H02883 Meibomian gland dysfunction of right eye, unspecified eyelid: Secondary | ICD-10-CM | POA: Diagnosis not present

## 2023-05-25 DIAGNOSIS — H353211 Exudative age-related macular degeneration, right eye, with active choroidal neovascularization: Secondary | ICD-10-CM | POA: Diagnosis not present

## 2023-05-25 DIAGNOSIS — H353132 Nonexudative age-related macular degeneration, bilateral, intermediate dry stage: Secondary | ICD-10-CM | POA: Diagnosis not present

## 2023-05-25 DIAGNOSIS — H35721 Serous detachment of retinal pigment epithelium, right eye: Secondary | ICD-10-CM | POA: Diagnosis not present

## 2023-05-25 DIAGNOSIS — H43812 Vitreous degeneration, left eye: Secondary | ICD-10-CM | POA: Diagnosis not present

## 2023-05-25 DIAGNOSIS — H4052X3 Glaucoma secondary to other eye disorders, left eye, severe stage: Secondary | ICD-10-CM | POA: Diagnosis not present

## 2023-05-25 DIAGNOSIS — H02886 Meibomian gland dysfunction of left eye, unspecified eyelid: Secondary | ICD-10-CM | POA: Diagnosis not present

## 2023-05-25 NOTE — Progress Notes (Deleted)
 Assessment/Plan:   1.  Parkinsons Disease             - continue carbidopa /levodopa  25/100, 2 tablets three times per day and move dosages to 8am/noon/4pm             -*** rytary  145 mg, 2 po q hs.  Call me and let me know how it does             -Continue rasagiline , 1 mg daily             -Has moderate Parkinson's dyskinesia.  She doesn't want to look at rytary  (during day)/crexont or add amantadine.               -PT referral   2.  RBD/insomnia             -she is no longer on klonopin  but has noted that dreaming vivid again.  Told her dreams are back as klonopin  was d/c when PCP started belsomra.  She forgot about that but is going to mention to pcp although is sleeping better.  PDMP reviewed 3.  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 4.  Atrial fibrillation             -Follows with cardiology.  On Eliquis .             -Patient with history of tachybradycardia syndrome and follows with cardiology for this as well.  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   Subjective:   [name] [name] was seen today in follow up for Parkinsons disease.  My previous records were reviewed prior to todays visit as well as outside records available to me.  She is with her husband who supplements the history.  She is still on the immediate release in the daytime, but we switched her to Rytary  at night for the cramping.  She seems to be doing better with this.  She did call us  on February 20 after a fall.  She fell backwards and hit the back part of her head.  She had been having some nausea and memory loss after this.  She was able to get seen at urgent care that day, at our request.  A CT brain was ordered and it was reported to be nonacute.  She has an appointment scheduled last month, but unfortunately she had missed that appointment.  We were able to work her in today.     Current prescribed movement disorder  medications: Carbidopa /levodopa  25/100, 2 tablets 3 times per day  Rytary  145 mg, 2 tablets at night azilect    Prior meds: Carbidopa /levodopa  50/200 CR at bed (did not help with cramping at night)   ALLERGIES:   Allergies  Allergen Reactions   Amiodarone  Other (See Comments)    Made pt feel terrible    Statins Other (See Comments)    Leg weakness - but tolerating low dose Crestor  every other day    CURRENT MEDICATIONS:  Outpatient Encounter Medications as of 05/26/2023  Medication Sig   acetaminophen  (TYLENOL ) 500 MG tablet Take 1,000 mg by mouth at bedtime as needed for mild pain, moderate pain or headache. (Patient not taking: Reported on 04/13/2023)   Acetylcysteine (NAC 600) 600 MG CAPS Take 600 mg by mouth in the morning.   AMBULATORY NON FORMULARY MEDICATION  Rollator Dx: Z61.B2   apixaban  (ELIQUIS ) 2.5 MG TABS tablet Take 1 tablet (2.5 mg total) by mouth 2 (two) times daily.   B-D 3CC LUER-LOK SYR 25GX1" 25G X 1" 3 ML MISC Inject 1 mL into the muscle once a week.   BELSOMRA 20 MG TABS Take 20 mg by mouth at bedtime as needed (sleep).   Calcium  Citrate-Vitamin D  (CITRACAL + D PO) Take 1 tablet by mouth every evening.   carbidopa -levodopa  (SINEMET  IR) 25-100 MG tablet TAKE TWO TABLETS THREE TIMES DAILY   Carbidopa -Levodopa  ER (RYTARY ) 36.25-145 MG CPCR Take 2 tablets by mouth at bedtime.   Carbidopa -Levodopa  ER 23.75-95 MG CPCR Take by mouth. (Patient not taking: Reported on 04/13/2023)   cholecalciferol (VITAMIN D3) 25 MCG (1000 UNIT) tablet Take 1,000 Units by mouth in the morning.   cyanocobalamin  (,VITAMIN B-12,) 1000 MCG/ML injection INJECT 1ML INTRAMUSCULARLY IN THE DELTOID ONCE A MONTH (ALTERNATING DELT. EACH MONTH)   Cyanocobalamin  (VITAMIN B12) 1000 MCG TBCR Take 1,000 mcg by mouth in the morning.   docusate sodium  (COLACE) 100 MG capsule Take 100 mg by mouth 2 (two) times daily.   GLUTATHIONE PO Take 2 capsules by mouth in the morning.   levothyroxine  (SYNTHROID ) 50 MCG  tablet Take 50-100 mcg by mouth See admin instructions. Take 2 tablets (100 mcg) by mouth on Mondays and Thursdays in the morning. Take 1 tablet (50 mcg) by mouth on Sundays, Tuesdays, Wednesdays, Fridays & Saturdays.   magnesium oxide (MAG-OX) 400 MG tablet Take 400 mg by mouth at bedtime.   metoprolol  succinate (TOPROL -XL) 25 MG 24 hr tablet TAKE ONE TABLET AT BEDTIME   multivitamin-lutein (OCUVITE-LUTEIN) CAPS capsule Take 1 capsule by mouth in the morning.   NON FORMULARY Take 1 tablet by mouth at bedtime. MetaCalm Stress Regulation Formula   polycarbophil (FIBERCON) 625 MG tablet Take 1,250 mg by mouth in the morning.   potassium chloride  (MICRO-K ) 10 MEQ CR capsule TAKE 6 CAPSULES EVERY MORNING AND AT NOON   rasagiline  (AZILECT ) 1 MG TABS tablet TAKE ONE TABLET BY MOUTH ONCE DAILY   spironolactone  (ALDACTONE ) 25 MG tablet Take 2 tablets (50 mg total) by mouth daily.   torsemide  (DEMADEX ) 20 MG tablet Take 4 tablets (80 mg total) by mouth every morning AND 3 tablets (60 mg total) every evening.   valACYclovir  (VALTREX ) 1000 MG tablet Take 1,000 mg by mouth 2 (two) times daily as needed (fever blisters).   vitamin C (ASCORBIC ACID) 500 MG tablet Take 500 mg by mouth in the morning.   Facility-Administered Encounter Medications as of 05/26/2023  Medication   sodium chloride  flush (NS) 0.9 % injection 10 mL    Objective:   PHYSICAL EXAMINATION:    VITALS:   There were no vitals filed for this visit.   Wt Readings from Last 3 Encounters:  04/13/23 119 lb (54 kg)  04/02/23 119 lb 3.2 oz (54.1 kg)  01/06/23 128 lb (58.1 kg)   GEN:  The patient appears stated age and is in NAD. HEENT:  Normocephalic.  Area of healing ecchymosis on the R eye, in various stages of healing.  The mucous membranes are moist. The superficial temporal arteries are without ropiness or tenderness.   Neurological examination:  Orientation: The patient is alert and oriented x3. Cranial nerves: There is good  facial symmetry with facial hypomimia. The speech is fluent and clear. Soft palate rises symmetrically and there is no tongue deviation. Hearing is intact to conversational tone. Sensation: Sensation is intact  to light touch throughout Motor: Strength is at least antigravity x4.  Movement examination: Tone: There is nl tone in the ue/le Abnormal movements: at least mod dyskinesia in the L>>R leg Coordination:  There is mild to mod dyskinesia in the legs Gait and Station: The patient's stride length is good.  Ambulates with good speed and good arm swing.  The patient has a neg pull test.     Total time spent on today's visit was *** minutes, including both face-to-face time and nonface-to-face time.  Time included that spent on review of records (prior notes available to me/labs/imaging if pertinent), discussing treatment and goals, answering patient's questions and coordinating care.  Cc:  Aldo Hun, MD

## 2023-05-26 ENCOUNTER — Ambulatory Visit: Admitting: Neurology

## 2023-05-26 ENCOUNTER — Encounter: Payer: Self-pay | Admitting: Neurology

## 2023-05-26 DIAGNOSIS — Z029 Encounter for administrative examinations, unspecified: Secondary | ICD-10-CM

## 2023-05-29 NOTE — Progress Notes (Signed)
 Remote pacemaker transmission.

## 2023-05-29 NOTE — Addendum Note (Signed)
 Addended by: Lott Rouleau A on: 05/29/2023 12:57 PM   Modules accepted: Orders

## 2023-06-01 ENCOUNTER — Other Ambulatory Visit: Payer: Self-pay | Admitting: Cardiovascular Disease

## 2023-06-01 DIAGNOSIS — I4811 Longstanding persistent atrial fibrillation: Secondary | ICD-10-CM

## 2023-06-01 NOTE — Telephone Encounter (Signed)
 Prescription refill request for Eliquis  received. Indication:afib Last office visit:2/25 Scr:1.03  2/25 Age: 83 Weight:54  kg  Prescription refilled

## 2023-06-15 ENCOUNTER — Inpatient Hospital Stay: Attending: Hematology and Oncology

## 2023-06-15 ENCOUNTER — Inpatient Hospital Stay (HOSPITAL_BASED_OUTPATIENT_CLINIC_OR_DEPARTMENT_OTHER): Admitting: Hematology and Oncology

## 2023-06-15 VITALS — BP 100/64 | HR 72 | Temp 97.6°F | Resp 16 | Ht 61.0 in | Wt 124.1 lb

## 2023-06-15 DIAGNOSIS — D649 Anemia, unspecified: Secondary | ICD-10-CM | POA: Insufficient documentation

## 2023-06-15 DIAGNOSIS — Z79899 Other long term (current) drug therapy: Secondary | ICD-10-CM | POA: Insufficient documentation

## 2023-06-15 DIAGNOSIS — Z7901 Long term (current) use of anticoagulants: Secondary | ICD-10-CM | POA: Diagnosis not present

## 2023-06-15 LAB — IRON AND IRON BINDING CAPACITY (CC-WL,HP ONLY)
Iron: 41 ug/dL (ref 28–170)
Saturation Ratios: 10 % — ABNORMAL LOW (ref 10.4–31.8)
TIBC: 424 ug/dL (ref 250–450)
UIBC: 383 ug/dL (ref 148–442)

## 2023-06-15 LAB — CBC WITH DIFFERENTIAL (CANCER CENTER ONLY)
Abs Immature Granulocytes: 0.03 10*3/uL (ref 0.00–0.07)
Basophils Absolute: 0 10*3/uL (ref 0.0–0.1)
Basophils Relative: 1 %
Eosinophils Absolute: 0.1 10*3/uL (ref 0.0–0.5)
Eosinophils Relative: 1 %
HCT: 31.5 % — ABNORMAL LOW (ref 36.0–46.0)
Hemoglobin: 9.9 g/dL — ABNORMAL LOW (ref 12.0–15.0)
Immature Granulocytes: 1 %
Lymphocytes Relative: 14 %
Lymphs Abs: 0.8 10*3/uL (ref 0.7–4.0)
MCH: 27.8 pg (ref 26.0–34.0)
MCHC: 31.4 g/dL (ref 30.0–36.0)
MCV: 88.5 fL (ref 80.0–100.0)
Monocytes Absolute: 0.5 10*3/uL (ref 0.1–1.0)
Monocytes Relative: 9 %
Neutro Abs: 4.3 10*3/uL (ref 1.7–7.7)
Neutrophils Relative %: 74 %
Platelet Count: 216 10*3/uL (ref 150–400)
RBC: 3.56 MIL/uL — ABNORMAL LOW (ref 3.87–5.11)
RDW: 15 % (ref 11.5–15.5)
WBC Count: 5.7 10*3/uL (ref 4.0–10.5)
nRBC: 0 % (ref 0.0–0.2)

## 2023-06-15 LAB — SAMPLE TO BLOOD BANK

## 2023-06-15 LAB — CMP (CANCER CENTER ONLY)
ALT: <5 U/L (ref 0–44)
AST: 16 U/L (ref 15–41)
Albumin: 4.2 g/dL (ref 3.5–5.0)
Alkaline Phosphatase: 87 U/L (ref 38–126)
Anion gap: 9 (ref 5–15)
BUN: 24 mg/dL — ABNORMAL HIGH (ref 8–23)
CO2: 29 mmol/L (ref 22–32)
Calcium: 9.1 mg/dL (ref 8.9–10.3)
Chloride: 98 mmol/L (ref 98–111)
Creatinine: 1.19 mg/dL — ABNORMAL HIGH (ref 0.44–1.00)
GFR, Estimated: 46 mL/min — ABNORMAL LOW (ref >60)
Glucose, Bld: 155 mg/dL — ABNORMAL HIGH (ref 70–99)
Potassium: 4.1 mmol/L (ref 3.5–5.1)
Sodium: 136 mmol/L (ref 135–145)
Total Bilirubin: 1 mg/dL (ref 0.0–1.2)
Total Protein: 7.1 g/dL (ref 6.5–8.1)

## 2023-06-15 NOTE — Progress Notes (Signed)
 Patient Care Team: Aldo Hun, MD as PCP - General (Internal Medicine) Nahser, Lela Purple, MD as PCP - Cardiology (Cardiology) Lei Pump, MD as PCP - Electrophysiology (Cardiology) Wilhemina Harden, MD as Referring Physician (Cardiothoracic Surgery) Aldon Hung, MD as Consulting Physician (Internal Medicine) Tat, Von Grumbling, DO as Consulting Physician (Neurology)  DIAGNOSIS:  Encounter Diagnosis  Name Primary?   Normocytic anemia Yes   CHIEF COMPLIANT:   HISTORY OF PRESENT ILLNESS:   History of Present Illness [name] [name] "[name] [name]" is an 83 year old female with anemia who presents for evaluation of her hemoglobin levels.  She experiences increased unsteadiness and movement issues, leading to injuries. She also has increased shortness of breath and fatigue. Her hemoglobin level is currently 9.9, decreased from 10.1 two months ago. It unexpectedly increased to 11 in February without recent iron infusions, the last of which were in November and December. She awaits current iron level results to determine if further supplementation is needed.  She experiences gastrointestinal issues, including nausea. She takes Miralax  daily, along with Benefiber, Colace, and Fibercon, and is evaluating the most effective combination. Her gastroenterologist advised a colonoscopy prep, resulting in bowel movements with urination.     ALLERGIES:  is allergic to amiodarone  and statins.  MEDICATIONS:  Current Outpatient Medications  Medication Sig Dispense Refill   Acetylcysteine (NAC 600) 600 MG CAPS Take 600 mg by mouth in the morning.     AMBULATORY NON FORMULARY MEDICATION Rollator Dx: G20.B2 1 Device 0   B-D 3CC LUER-LOK SYR 25GX1" 25G X 1" 3 ML MISC Inject 1 mL into the muscle once a week.     BELSOMRA 20 MG TABS Take 20 mg by mouth at bedtime as needed (sleep).     Calcium  Citrate-Vitamin D  (CITRACAL + D PO) Take 1 tablet by mouth every evening.     carbidopa -levodopa   (SINEMET  IR) 25-100 MG tablet TAKE TWO TABLETS THREE TIMES DAILY 540 tablet 0   Carbidopa -Levodopa  ER (RYTARY ) 36.25-145 MG CPCR Take 2 tablets by mouth at bedtime. 180 capsule 0   cholecalciferol (VITAMIN D3) 25 MCG (1000 UNIT) tablet Take 1,000 Units by mouth in the morning.     cyanocobalamin  (,VITAMIN B-12,) 1000 MCG/ML injection INJECT 1ML INTRAMUSCULARLY IN THE DELTOID ONCE A MONTH (ALTERNATING DELT. EACH MONTH)     Cyanocobalamin  (VITAMIN B12) 1000 MCG TBCR Take 1,000 mcg by mouth in the morning.     docusate sodium  (COLACE) 100 MG capsule Take 100 mg by mouth 2 (two) times daily.     ELIQUIS  2.5 MG TABS tablet Take 1 tablet (2.5 mg total) by mouth 2 (two) times daily. 60 tablet 5   GLUTATHIONE PO Take 2 capsules by mouth in the morning.     levothyroxine  (SYNTHROID ) 50 MCG tablet Take 50-100 mcg by mouth See admin instructions. Take 2 tablets (100 mcg) by mouth on Mondays and Thursdays in the morning. Take 1 tablet (50 mcg) by mouth on Sundays, Tuesdays, Wednesdays, Fridays & Saturdays.     magnesium oxide (MAG-OX) 400 MG tablet Take 400 mg by mouth at bedtime.     metoprolol  succinate (TOPROL -XL) 25 MG 24 hr tablet TAKE ONE TABLET AT BEDTIME 30 tablet 6   NON FORMULARY Take 1 tablet by mouth at bedtime. MetaCalm Stress Regulation Formula     potassium chloride  (MICRO-K ) 10 MEQ CR capsule TAKE 6 CAPSULES EVERY MORNING AND AT NOON 360 capsule 3   rasagiline  (AZILECT ) 1 MG TABS tablet TAKE ONE TABLET  BY MOUTH ONCE DAILY 90 tablet 0   spironolactone  (ALDACTONE ) 25 MG tablet Take 2 tablets (50 mg total) by mouth daily. 180 tablet 3   torsemide  (DEMADEX ) 20 MG tablet Take 4 tablets (80 mg total) by mouth every morning AND 3 tablets (60 mg total) every evening. 300 tablet 3   vitamin C (ASCORBIC ACID) 500 MG tablet Take 500 mg by mouth in the morning.     acetaminophen  (TYLENOL ) 500 MG tablet Take 1,000 mg by mouth at bedtime as needed for mild pain, moderate pain or headache. (Patient not  taking: Reported on 04/13/2023)     Carbidopa -Levodopa  ER 23.75-95 MG CPCR Take by mouth. (Patient not taking: Reported on 06/15/2023)     polyethylene glycol (MIRALAX ) 17 g packet Take by mouth.     valACYclovir  (VALTREX ) 1000 MG tablet Take 1,000 mg by mouth 2 (two) times daily as needed (fever blisters). (Patient not taking: Reported on 06/15/2023)     No current facility-administered medications for this visit.   Facility-Administered Medications Ordered in Other Visits  Medication Dose Route Frequency Provider Last Rate Last Admin   sodium chloride  flush (NS) 0.9 % injection 10 mL  10 mL Intravenous Q12H Geary Kells, PA-C        PHYSICAL EXAMINATION: ECOG PERFORMANCE STATUS: 1 - Symptomatic but completely ambulatory  Vitals:   06/15/23 1526  BP: 100/64  Pulse: 72  Resp: 16  Temp: 97.6 F (36.4 C)  SpO2: 96%   Filed Weights   06/15/23 1526  Weight: 124 lb 1.6 oz (56.3 kg)    Physical Exam   (exam performed in the presence of a chaperone)  LABORATORY DATA:  I have reviewed the data as listed    Latest Ref Rng & Units 04/02/2023   12:38 PM 11/26/2022   10:12 AM 11/06/2022   11:10 AM  CMP  Glucose 70 - 99 mg/dL 119  89  147   BUN 8 - 23 mg/dL 21  21  24    Creatinine 0.44 - 1.00 mg/dL 8.29  5.62  1.30   Sodium 135 - 145 mmol/L 137  131  132   Potassium 3.5 - 5.1 mmol/L 4.0  4.6  4.3   Chloride 98 - 111 mmol/L 98  95  95   CO2 22 - 32 mmol/L 29  29  27    Calcium  8.9 - 10.3 mg/dL 9.5  9.6  9.2     Lab Results  Component Value Date   WBC 5.7 06/15/2023   HGB 9.9 (L) 06/15/2023   HCT 31.5 (L) 06/15/2023   MCV 88.5 06/15/2023   PLT 216 06/15/2023   NEUTROABS 4.3 06/15/2023    ASSESSMENT & PLAN:  Normocytic anemia Lab review: 06/11/2021: Hemoglobin 10.3, MCV 88.2 02/21/2022: Hemoglobin 8.3, MCV 101, ferritin 88, creatinine 1.08, iron saturation 13%, TIBC 406 03/06/2022: Hemoglobin 8.7, MCV 96, RDW 15.9 04/21/2022: Hemoglobin 10.8, MCV 89.3 05/27/2022: Hemoglobin  10.5 12/17/2022: Hemoglobin 9.1, MCV 78.5 (liver biopsy: Cirrhosis) 06/15/2023: Hemoglobin 9.9, MCV 88.5, iron saturation 10%, creatinine 1.19   Cord stem cell infusion November 2023   Work-up: 03/11/2022 SPEP: No M Protein LDH 232 (was 535) Folate: 15.9, TSH 4.1, DAT: Neg, Retic 2.3%, Imm Retic Frac: 24.8, BR 1.5, B 12: 690, EPO: 39.2, Hb 9.5, Cr 0.85   Anticoagulation Eliquis  dose recently reduced due to age and weight considerations. No reported adverse effects.  Since her blood counts have remained stable we decided not to do any further interventions. Patient is has Parkinson's  and is concerned about worsening neuromotor effects. She and her husband both live at [facility].  Repeat labs in 4 months and follow-up Assessment & Plan Normocytic anemia Chronic normocytic anemia with stable hemoglobin around 9.9. Normal red cell size suggests non-iron deficiency etiology. Possible causes include medication effects or age-related marrow changes. Erythropoietin  injections not initiated due to current hemoglobin levels. - Await iron level results for potential supplementation. - Consider erythropoietin  if hemoglobin drops below 9. - Recheck blood counts every 3-4 months if iron levels stable. - Contact if iron levels low or changes in liver/kidney function tests.      No orders of the defined types were placed in this encounter.  The patient has a good understanding of the overall plan. she agrees with it. she will call with any problems that may develop before the next visit here. Total time spent: 30 mins including face to face time and time spent for planning, charting and co-ordination of care   Viinay K Luciana Cammarata, MD 06/15/23

## 2023-06-15 NOTE — Assessment & Plan Note (Signed)
 Lab review: 06/11/2021: Hemoglobin 10.3, MCV 88.2 02/21/2022: Hemoglobin 8.3, MCV 101, ferritin 88, creatinine 1.08, iron saturation 13%, TIBC 406 03/06/2022: Hemoglobin 8.7, MCV 96, RDW 15.9 04/21/2022: Hemoglobin 10.8, MCV 89.3 05/27/2022: Hemoglobin 10.5 12/17/2022: Hemoglobin 9.1, MCV 78.5 (liver biopsy: Cirrhosis)   Cord stem cell infusion November 2023   Work-up: 03/11/2022 SPEP: No M Protein LDH 232 (was 535) Folate: 15.9, TSH 4.1, DAT: Neg, Retic 2.3%, Imm Retic Frac: 24.8, BR 1.5, B 12: 690, EPO: 39.2, Hb 9.5, Cr 0.85   Anticoagulation Eliquis  dose recently reduced due to age and weight considerations. No reported adverse effects.

## 2023-06-16 LAB — FERRITIN: Ferritin: 26 ng/mL (ref 11–307)

## 2023-06-19 DIAGNOSIS — M94262 Chondromalacia, left knee: Secondary | ICD-10-CM | POA: Diagnosis not present

## 2023-06-19 DIAGNOSIS — M25562 Pain in left knee: Secondary | ICD-10-CM | POA: Diagnosis not present

## 2023-06-24 ENCOUNTER — Other Ambulatory Visit: Payer: Self-pay | Admitting: Neurology

## 2023-06-24 DIAGNOSIS — K7460 Unspecified cirrhosis of liver: Secondary | ICD-10-CM | POA: Diagnosis not present

## 2023-06-24 DIAGNOSIS — G20B1 Parkinson's disease with dyskinesia, without mention of fluctuations: Secondary | ICD-10-CM

## 2023-06-24 DIAGNOSIS — K5900 Constipation, unspecified: Secondary | ICD-10-CM | POA: Diagnosis not present

## 2023-06-24 DIAGNOSIS — R110 Nausea: Secondary | ICD-10-CM | POA: Diagnosis not present

## 2023-06-24 DIAGNOSIS — G20A1 Parkinson's disease without dyskinesia, without mention of fluctuations: Secondary | ICD-10-CM

## 2023-06-26 NOTE — Telephone Encounter (Signed)
 Spoke to pharmacist patient wants 3 months worth of meds I let them know she needs to come to appointment before refill she has plenty but wants 3 months worth

## 2023-07-01 NOTE — Progress Notes (Signed)
 Assessment/Plan:   1.  Parkinsons Disease             - continue carbidopa /levodopa  25/100, 2 tablets three times per day.  We discussed changing all of the dosages to Rytary  to help decreased dyskinesia and decrease off time, but she is nervous about this and wants to continue with IR levodopa , even though the dyskinesia can get severe.  I did tell her she can start taking it 1 tablet 6 times per day if she can remember it that way to see if dyskinesia is less.             - Continue rytary  145 mg, 2 po q hs.    - She is having some cramping, both day and night.  She is not sure if that is when she is missing the late afternoon dosage of levodopa , but is going to pay more attention to this.  If it is more in the evening before she takes the Rytary , we may just move that dose up earlier, or maybe even increase the Rytary  dose at night.  She is to start keeping a log of when it occurs and let me know.             -Continue rasagiline , 1 mg daily             -Has moderate Parkinson's dyskinesia.  She does want to try amantadine.  We talked about the risks and benefits of this, including the risks of confusion.  She will take it with the 2nd and 3rd dose of levodopa .  - Patient needs to increase exercise !             -PT  - declines referral, but I encouraged her to think about that.  - As I was walking out of the office today, she asked about surgical interventions, including DBS therapy.  We did talk for a few minutes about DBS and what it would entail and what it would help with.  I had already been in the office 45 minutes, so we did not talk too long about this, but discussed that she may be a candidate if she would like to be.  She is going to think about this.   2.  RBD/insomnia             -she is no longer on klonopin  but has noted that dreaming vivid again.  Primary care is managing.  She is on Belsomra.  She is not sure that is helping. 3.  Anemia secondary to cirrhosis and B12  deficiency             -Follows with hematology             -Receives IV iron as needed. 4.  Atrial fibrillation             -Follows with cardiology.  On Eliquis .             -Patient with history of tachybradycardia syndrome and follows with cardiology for this as well.  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 5.  Constipation  -discussed nature and pathophysiology and association with PD  -discussed importance of hydration.  Pt is to increase water intake  -pt is given a copy of the rancho recipe  -recommended daily colace  -recommended miralax  prn   Subjective:   [name] [name] was seen today in follow up for Parkinsons disease.  My previous records were reviewed prior to todays visit as  well as outside records available to me.  She is with her husband who supplements the history.  I have actually not seen the patient since September (some missed appointments).  She is still on the immediate release in the daytime, but we switched her to Rytary  at night for the cramping.  She seems to be doing better with this.  She did call us  on February 20 after a fall.  She fell backwards and hit the back part of her head.  She had been having some nausea and memory loss after this.  She was able to get seen at urgent care that day, at our request.  A CT brain was ordered and it was reported to be nonacute.  She reports that balance is more problematic.  She is taking shorter steps.  She is more constipated.  She is more anxious.  She has more trouble sleeping.  She is on belsomra from the pcp and it works some and sometimes not others.  Lots of cramping at night.       Current prescribed movement disorder medications: Carbidopa /levodopa  25/100, 2 tablets 3 times per day  Rytary  145 mg, 2 tablets at night azilect    Prior meds: Carbidopa /levodopa  50/200 CR at bed (did not help with cramping at night)    ALLERGIES:   Allergies  Allergen Reactions   Amiodarone  Other (See Comments)    Made pt feel terrible     Statins Other (See Comments)    Leg weakness - but tolerating low dose Crestor  every other day    CURRENT MEDICATIONS:  Outpatient Encounter Medications as of 07/06/2023  Medication Sig   Acetylcysteine (NAC 600) 600 MG CAPS Take 600 mg by mouth in the morning.   AMBULATORY NON FORMULARY MEDICATION Rollator Dx: G20.B2   B-D 3CC LUER-LOK SYR 25GX1" 25G X 1" 3 ML MISC Inject 1 mL into the muscle once a week.   BELSOMRA 20 MG TABS Take 20 mg by mouth at bedtime as needed (sleep).   Calcium  Citrate-Vitamin D  (CITRACAL + D PO) Take 1 tablet by mouth every evening.   carbidopa -levodopa  (SINEMET  IR) 25-100 MG tablet TAKE TWO TABLETS THREE TIMES DAILY   Carbidopa -Levodopa  ER (RYTARY ) 36.25-145 MG CPCR Take 2 tablets by mouth at bedtime.   Carbidopa -Levodopa  ER 23.75-95 MG CPCR Take by mouth.   cholecalciferol (VITAMIN D3) 25 MCG (1000 UNIT) tablet Take 1,000 Units by mouth in the morning.   cyanocobalamin  (,VITAMIN B-12,) 1000 MCG/ML injection INJECT 1ML INTRAMUSCULARLY IN THE DELTOID ONCE A MONTH (ALTERNATING DELT. EACH MONTH)   Cyanocobalamin  (VITAMIN B12) 1000 MCG TBCR Take 1,000 mcg by mouth in the morning.   docusate sodium  (COLACE) 100 MG capsule Take 100 mg by mouth 2 (two) times daily.   ELIQUIS  2.5 MG TABS tablet Take 1 tablet (2.5 mg total) by mouth 2 (two) times daily.   GLUTATHIONE PO Take 2 capsules by mouth in the morning.   levothyroxine  (SYNTHROID ) 50 MCG tablet Take 50-100 mcg by mouth See admin instructions. Take 2 tablets (100 mcg) by mouth on Mondays and Thursdays in the morning. Take 1 tablet (50 mcg) by mouth on Sundays, Tuesdays, Wednesdays, Fridays & Saturdays.   magnesium oxide (MAG-OX) 400 MG tablet Take 400 mg by mouth at bedtime.   metoprolol  succinate (TOPROL -XL) 25 MG 24 hr tablet TAKE ONE TABLET AT BEDTIME   NON FORMULARY Take 1 tablet by mouth at bedtime. MetaCalm Stress Regulation Formula   polyethylene glycol (MIRALAX ) 17 g packet Take by mouth.  potassium  chloride (MICRO-K ) 10 MEQ CR capsule TAKE 6 CAPSULES EVERY MORNING AND AT NOON   rasagiline  (AZILECT ) 1 MG TABS tablet TAKE ONE TABLET BY MOUTH ONCE DAILY   spironolactone  (ALDACTONE ) 25 MG tablet Take 2 tablets (50 mg total) by mouth daily.   torsemide  (DEMADEX ) 20 MG tablet Take 4 tablets (80 mg total) by mouth every morning AND 3 tablets (60 mg total) every evening.   vitamin C (ASCORBIC ACID) 500 MG tablet Take 500 mg by mouth in the morning.   [DISCONTINUED] acetaminophen  (TYLENOL ) 500 MG tablet Take 1,000 mg by mouth at bedtime as needed for mild pain, moderate pain or headache. (Patient not taking: Reported on 04/13/2023)   [DISCONTINUED] valACYclovir  (VALTREX ) 1000 MG tablet Take 1,000 mg by mouth 2 (two) times daily as needed (fever blisters). (Patient not taking: Reported on 06/15/2023)   Facility-Administered Encounter Medications as of 07/06/2023  Medication   sodium chloride  flush (NS) 0.9 % injection 10 mL    Objective:   PHYSICAL EXAMINATION:    VITALS:   Vitals:   07/06/23 1358  BP: 116/68  Pulse: 63  SpO2: 98%  Weight: 121 lb 12.8 oz (55.2 kg)  Height: 5\' 1"  (1.549 m)     Wt Readings from Last 3 Encounters:  07/06/23 121 lb 12.8 oz (55.2 kg)  06/15/23 124 lb 1.6 oz (56.3 kg)  04/13/23 119 lb (54 kg)   GEN:  The patient appears stated age and is in NAD. HEENT:  Normocephalic, AT.  MMM CV:  RRR Lungs:  CTAB Neck:  no bruits   Neurological examination:  Orientation: The patient is alert and oriented x3. Cranial nerves: There is good facial symmetry with facial hypomimia. The speech is fluent and clear. Soft palate rises symmetrically and there is no tongue deviation. Hearing is intact to conversational tone. Sensation: Sensation is intact to light touch throughout Motor: Strength is at least antigravity x4.  Movement examination: Tone: There is nl tone in the ue/le Abnormal movements: mild to mod dyskinesia in the legs/feet early in the visit.  By the end of  the visit, her dyskinesia had become moderate to severe, as she had just taken her medication right before I walked in. Coordination:  There is no decremation Gait and Station: The patient's stride length is good.  Ambulates with good speed and good arm swing.      Total time spent on today's visit was 44 minutes, including both face-to-face time and nonface-to-face time.  Time included that spent on review of records (prior notes available to me/labs/imaging if pertinent), discussing treatment and goals, answering patient's questions and coordinating care.  Cc:  Aldo Hun, MD

## 2023-07-06 ENCOUNTER — Encounter: Payer: Self-pay | Admitting: Neurology

## 2023-07-06 ENCOUNTER — Ambulatory Visit (INDEPENDENT_AMBULATORY_CARE_PROVIDER_SITE_OTHER): Admitting: Neurology

## 2023-07-06 VITALS — BP 116/68 | HR 63 | Ht 61.0 in | Wt 121.8 lb

## 2023-07-06 DIAGNOSIS — G20B2 Parkinson's disease with dyskinesia, with fluctuations: Secondary | ICD-10-CM | POA: Diagnosis not present

## 2023-07-06 DIAGNOSIS — G20B1 Parkinson's disease with dyskinesia, without mention of fluctuations: Secondary | ICD-10-CM

## 2023-07-06 DIAGNOSIS — G20A1 Parkinson's disease without dyskinesia, without mention of fluctuations: Secondary | ICD-10-CM

## 2023-07-06 DIAGNOSIS — K5901 Slow transit constipation: Secondary | ICD-10-CM

## 2023-07-06 MED ORDER — RYTARY 36.25-145 MG PO CPCR
2.0000 | ORAL_CAPSULE | Freq: Every day | ORAL | 1 refills | Status: DC
Start: 2023-07-06 — End: 2023-10-15

## 2023-07-06 MED ORDER — CARBIDOPA-LEVODOPA 25-100 MG PO TABS
2.0000 | ORAL_TABLET | Freq: Three times a day (TID) | ORAL | 1 refills | Status: DC
Start: 2023-07-06 — End: 2023-10-15

## 2023-07-06 MED ORDER — AMANTADINE HCL 100 MG PO CAPS: 100.0000 mg | ORAL_CAPSULE | Freq: Two times a day (BID) | ORAL | 1 refills | Status: DC

## 2023-07-06 MED ORDER — RASAGILINE MESYLATE 1 MG PO TABS
1.0000 mg | ORAL_TABLET | Freq: Every day | ORAL | 1 refills | Status: DC
Start: 2023-07-06 — End: 2023-12-07

## 2023-07-06 NOTE — Patient Instructions (Addendum)
 [place]  Let me know if you want a referral. You need to exercise on a stationary bike START amantadine 100 mg with your 2nd and 3rd dosages of levodopa  Pay attention to the timing of your cramping as it relates to the timing of your levodopa  SAVE the date!  9/19 at the [facility] we will have Parkinsons Disease symposium.    Constipation and Parkinson's disease:  1.Rancho recipe for constipation in Parkinsons Disease:  -1 cup of unprocessed bran (need to get this at [employer], [employer] or similar type of store), 2 cups of applesauce in 1 cup of prune juice 2.  Increase fiber intake (Metamucil,vegetables) 3.  Regular, moderate exercise can be beneficial. 4.  Avoid medications causing constipation, such as medications like antacids with calcium  or magnesium 5.  It's okay to take daily Miralax , and taper if stools become too loose or you experience diarrhea 6.  Stool softeners (Colace) can help with chronic constipation and I recommend you take this daily. 7.  Increase water intake.  You should be drinking 1/2 gallon of water a day as long as you have not been diagnosed with congestive heart failure or renal/kidney failure.  This is probably the single greatest thing that you can do to help your constipation.  8.  Add magnesium oxide 400 mg at bed

## 2023-07-13 ENCOUNTER — Telehealth (HOSPITAL_COMMUNITY): Payer: Self-pay

## 2023-07-13 NOTE — Telephone Encounter (Signed)
 Called to confirm/remind patient of their appointment at the [facility] on 07/14/23.   Appointment:   [x] Confirmed  [] Left mess   [] No answer/No voice mail  [] VM Full/unable to leave message  [] Phone not in service  Patient reminded to bring all medications and/or complete list.  Confirmed patient has transportation. Gave directions, instructed to utilize valet parking.

## 2023-07-14 ENCOUNTER — Encounter (HOSPITAL_COMMUNITY): Payer: Self-pay

## 2023-07-14 ENCOUNTER — Ambulatory Visit (HOSPITAL_COMMUNITY)
Admission: RE | Admit: 2023-07-14 | Discharge: 2023-07-14 | Disposition: A | Discharge status: Home / Self Care | Payer: Medicare Other | Source: Ambulatory Visit | Attending: Adult Health | Admitting: Adult Health

## 2023-07-14 VITALS — BP 110/64 | HR 77 | Ht 61.0 in | Wt 113.2 lb

## 2023-07-14 DIAGNOSIS — D509 Iron deficiency anemia, unspecified: Secondary | ICD-10-CM | POA: Diagnosis not present

## 2023-07-14 DIAGNOSIS — Z950 Presence of cardiac pacemaker: Secondary | ICD-10-CM | POA: Diagnosis not present

## 2023-07-14 DIAGNOSIS — I480 Paroxysmal atrial fibrillation: Secondary | ICD-10-CM | POA: Diagnosis not present

## 2023-07-14 DIAGNOSIS — I5032 Chronic diastolic (congestive) heart failure: Secondary | ICD-10-CM | POA: Insufficient documentation

## 2023-07-14 DIAGNOSIS — K7460 Unspecified cirrhosis of liver: Secondary | ICD-10-CM | POA: Insufficient documentation

## 2023-07-14 DIAGNOSIS — Z7901 Long term (current) use of anticoagulants: Secondary | ICD-10-CM | POA: Diagnosis not present

## 2023-07-14 DIAGNOSIS — I495 Sick sinus syndrome: Secondary | ICD-10-CM | POA: Insufficient documentation

## 2023-07-14 DIAGNOSIS — I272 Pulmonary hypertension, unspecified: Secondary | ICD-10-CM | POA: Insufficient documentation

## 2023-07-14 DIAGNOSIS — R079 Chest pain, unspecified: Secondary | ICD-10-CM | POA: Insufficient documentation

## 2023-07-14 DIAGNOSIS — G20A1 Parkinson's disease without dyskinesia, without mention of fluctuations: Secondary | ICD-10-CM | POA: Diagnosis not present

## 2023-07-14 DIAGNOSIS — I361 Nonrheumatic tricuspid (valve) insufficiency: Secondary | ICD-10-CM | POA: Diagnosis not present

## 2023-07-14 NOTE — Patient Instructions (Addendum)
 Good to see you today!    We have ordered you a cardiac PET scan you will be called to schedule the appointment  Labs done today, your results will be available in MyChart, we will contact you for abnormal readings.  Your physician recommends that you schedule a follow-up appointment as scheduled  If you have any questions or concerns before your next appointment please send us  a message through [place] or call our office at [phone].    TO LEAVE A MESSAGE FOR THE NURSE SELECT OPTION 2, PLEASE LEAVE A MESSAGE INCLUDING: YOUR NAME DATE OF BIRTH CALL BACK NUMBER REASON FOR CALL**this is important as we prioritize the call backs  YOU WILL RECEIVE A CALL BACK THE SAME DAY AS LONG AS YOU CALL BEFORE 4:00 PM At the [facility], you and your health needs are our priority. As part of our continuing mission to provide you with exceptional heart care, we have created designated Provider Care Teams. These Care Teams include your primary Cardiologist (physician) and Advanced Practice Providers (APPs- Physician Assistants and Nurse Practitioners) who all work together to provide you with the care you need, when you need it.   You may see any of the following providers on your designated Care Team at your next follow up: Dr Jules Oar Dr Peder Bourdon Dr. Alwin Baars Dr. Arta Lark Amy Marijane Shoulders, NP Ruddy Corral, [place] [facility] [place], [place] Dennise Fitz, NP [place] Lee, NP Shawnee Dellen, NP Luster Salters, PharmD Bevely Brush, PharmD   Please be sure to bring in all your medications bottles to every appointment.    Thank you for choosing [facility]

## 2023-07-14 NOTE — Progress Notes (Signed)
 PCP: Aldo Hun, MD EP: Dr. Lawana Pray HF Cardiology: Dr. Mitzie Anda  Chief complaint: CHF  83 y.o. with history of paroxysmal atrial fibrillation with tachy-brady syndrome and MDT pacemaker, Parkinsons disease, cirrhosis (possible NAFLD) was referred by Kay Parson PA for evaluation of pulmonary hypertension on echo.  Patient had an echo done in 3/23 showing EF 65-70%, mild LVH, normal RV, PASP 60 mmHg, moderate biatrial enlargement, mild MR, moderate TR. Prior echoes had shown mildly elevated PA pressure. Cardiolite in 10/21 showed no ischemia.   RHC was done in 5/23 showing mild pulmonary venous hypertension with mildly elevated PCWP. She was started on Farxiga .  She developed a persistent UTI that was difficult to clear and stopped Farxiga .   Acute follow up 02/20/22 for dyspnea. CTA chest showed no PE, moderate right pleural effusion, mild pulmonary edema. NYHA III symptoms and volume overloaded, she was given IV lasix  80 mg x 1 then instructed to use [name]  daily x 2 days, then switch from Lasix  to torsemide  60 mg daily. RHC arranged which showed elevated R/L heart filling pressures, prominent V waves in PCWP tracing, preserved CO and moderate pulmonary venous hypertension. Echo arranged to assess MR and torsemide  increased to 60/40.  S/p R therapeutic thoracentesis 02/25/22 yielding 350 ml fluid.  Echo 2/24 showed EF 55-60%, normal RV, mild MR, moderate-severe TR with dilated IVC.  TEE in 3/24 showed EF 55-60%, mild RV enlargement with mildly decreased RV systolic function, mild MR, severe TR (PPM lead not fused to leaflets), small PFO.   Follow up with EP 06/23/22, device interrogation showing 8% burden of AT/AF. Planning to re-evaluate after possible TR intervention.  Saw Dr. Konrad Persia @ [facility] in 5/24 and had workup for percutaneous tricuspid interventions (Triclip, Evoque valve).  She was determined not to be a candidate for any of the available tricuspid interventions.    She saw Dr.  Lawana Pray; he did not think she was a candidate for repeat AF ablation.   Today she returns for HF follow up with her husband.  Last night she woke up with sharp chest pain. Never had this kind of pain before.  She got dressed and wanted to the ED but eventually chest pain went away so she and her husband decided to wait for this visit.  Her husband tells me it takes her longer to do things. Taking her longer to get started and do things. Gets a little short of breath with exertion. Denies PND/Orthopnea. Appetite has not been great. No fever or chills.  Taking all medications.   Medtronic device interrogation- A fib < 0.5 hours.    PMH: 1. Atrial fibrillation: Paroxysmal.  S/p AF (epicardial and endocardial) ablation in 10/13.  2. Tachy-brady syndrome: Medtronic PPM placed in 9/14.  3. H/o atypical atrial flutter 4. Hyperlipidemia 5. HTN 6. Hypothyroidism 7. Parkinsons disease: Followed by Dr. Winferd Hatter 8. Cirrhosis: ?NAFLD. No ETOH.  9. Chronic diastolic CHF/pulmonary hypertension:  - LHC (2014): No significant CAD.  - Cardiolite (10/21): EF 71%, no ischemia.  - Echo (3/23): EF 65-70%, mild LVH, normal RV, PASP 60 mmHg, moderate biatrial enlargement, mild MR, moderate TR.  - RHC (5/23): mean RA 8, PA 49/13 mean 30, mean PCWP 19 with v-waves to 30, CI 3.96, PVR 1.8 WU.  - RHC (2/24): RA mean 12, PA 53/24 (mean 39) PCWP mean 23 with prominent v-waves to 35, CO/CI (Fick) 6.75/4.23, PVR 2.4 WU - Echo (2/24): EF 55-60%, normal RV, mild MR, moderate-severe TR with dilated IVC. - TEE (  3/24): EF 55-60%, mild RV enlargement with mildly decreased RV systolic function, mild MR, severe TR (PPM lead not fused to leaflets), small PFO.  10. B12 deficiency.  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  - Seen at [facility] in [place], not candidate for percutaneous tricuspid repair.    Social History   Socioeconomic History   Marital status: Married    Spouse name: Not on file   Number of children: 2   Years of education: Not on file   Highest education level: Master's degree (e.g., MA, MS, MEng, MEd, MSW, MBA)  Occupational History   Not on file  Tobacco Use   Smoking status: Never   Smokeless tobacco: Never  Vaping Use   Vaping status: Never Used  Substance and Sexual Activity   Alcohol use: No   Drug use: No   Sexual activity: Yes    Partners: Male    Birth control/protection: Post-menopausal  Other Topics Concern   Not on file  Social History Narrative   Lives in [place].  Retired Comptroller.   Right handed    Social Drivers of Health   Financial Resource Strain: Not on file  Food Insecurity: Low Risk  (04/15/2023)   Received from [facility]   Hunger Vital Sign    Worried About Running Out of Food in the Last Year: Never true    Ran Out of Food in the Last Year: Never true  Transportation Needs: No Transportation Needs (04/15/2023)   Received from [facility]    In the past 12 months, has lack of reliable transportation kept you from medical appointments, meetings, work or from getting things needed for daily living? : No  Physical Activity: Not on file  Stress: Not on file  Social Connections: Not on file  Intimate Partner Violence: Not on file   Family History  Problem Relation Age of Onset   Alzheimer's disease Mother    Heart failure Father    Healthy Child    Healthy Child    Breast cancer Maternal Aunt    ROS: All systems reviewed and negative except as per HPI.   Current Outpatient Medications  Medication Sig Dispense Refill   Acetylcysteine (NAC 600) 600 MG CAPS Take 600 mg by mouth in the morning.     amantadine  (SYMMETREL ) 100 MG capsule Take 1 capsule (100 mg total) by mouth 2 (two) times daily. 180 capsule 1   B-D 3CC LUER-LOK SYR 25GX1" 25G X 1" 3 ML MISC Inject 1 mL into the muscle once a week.      BELSOMRA 20 MG TABS Take 20 mg by mouth at bedtime as needed (sleep).     Calcium  Citrate-Vitamin D  (CITRACAL + D PO) Take 1 tablet by mouth every evening.     carbidopa -levodopa  (SINEMET  IR) 25-100 MG tablet Take 2 tablets by mouth 3 (three) times daily. 540 tablet 1   Carbidopa -Levodopa  ER (RYTARY ) 36.25-145 MG CPCR Take 2 tablets by mouth at bedtime. 180 capsule 1   Carbidopa -Levodopa  ER 23.75-95 MG CPCR Take by mouth.     cholecalciferol (VITAMIN D3) 25 MCG (1000 UNIT) tablet Take 1,000 Units by mouth in the morning.     cyanocobalamin  (,VITAMIN B-12,) 1000 MCG/ML injection INJECT 1ML INTRAMUSCULARLY IN THE DELTOID ONCE A MONTH (ALTERNATING DELT. EACH MONTH)     Cyanocobalamin  (VITAMIN B12)  1000 MCG TBCR Take 1,000 mcg by mouth in the morning.     docusate sodium  (COLACE) 100 MG capsule Take 100 mg by mouth 2 (two) times daily.     ELIQUIS  2.5 MG TABS tablet Take 1 tablet (2.5 mg total) by mouth 2 (two) times daily. 60 tablet 5   GLUTATHIONE PO Take 2 capsules by mouth in the morning.     levothyroxine  (SYNTHROID ) 50 MCG tablet Take 50-100 mcg by mouth See admin instructions. Take 2 tablets (100 mcg) by mouth on Mondays and Thursdays in the morning. Take 1 tablet (50 mcg) by mouth on Sundays, Tuesdays, Wednesdays, Fridays & Saturdays.     magnesium oxide (MAG-OX) 400 MG tablet Take 400 mg by mouth at bedtime.     metoprolol  succinate (TOPROL -XL) 25 MG 24 hr tablet TAKE ONE TABLET AT BEDTIME 30 tablet 6   NON FORMULARY Take 1 tablet by mouth at bedtime. MetaCalm Stress Regulation Formula     polyethylene glycol (MIRALAX ) 17 g packet Take by mouth.     potassium chloride  (MICRO-K ) 10 MEQ CR capsule TAKE 6 CAPSULES EVERY MORNING AND AT NOON 360 capsule 3   rasagiline  (AZILECT ) 1 MG TABS tablet Take 1 tablet (1 mg total) by mouth daily. 90 tablet 1   spironolactone  (ALDACTONE ) 25 MG tablet Take 2 tablets (50 mg total) by mouth daily. 180 tablet 3   torsemide  (DEMADEX ) 20 MG tablet Take 4  tablets (80 mg total) by mouth every morning AND 3 tablets (60 mg total) every evening. 300 tablet 3   vitamin C (ASCORBIC ACID) 500 MG tablet Take 500 mg by mouth in the morning.     AMBULATORY NON FORMULARY MEDICATION Rollator Dx: G20.B2 1 Device 0   No current facility-administered medications for this encounter.   Facility-Administered Medications Ordered in Other Encounters  Medication Dose Route Frequency Provider Last Rate Last Admin   sodium chloride  flush (NS) 0.9 % injection 10 mL  10 mL Intravenous Q12H Aylssa, Herrig, PA-C       BP 110/64   Pulse 77   Ht 5\' 1"  (1.549 m)   Wt 51.3 kg (113 lb 3.2 oz)   LMP 02/04/1996   SpO2 98%   BMI 21.39 kg/m   Wt Readings from Last 3 Encounters:  07/14/23 51.3 kg (113 lb 3.2 oz)  07/06/23 55.2 kg (121 lb 12.8 oz)  06/15/23 56.3 kg (124 lb 1.6 oz)   General:   No resp difficulty Neck: supple. no JVD.  Cor: PMI nondisplaced. Regular rate & rhythm. No rubs, gallops or murmurs. Lungs: clear Abdomen: soft, nontender, nondistended.  Extremities: no cyanosis, clubbing, rash, edema Neuro: alert & oriented x3   Assessment/Plan: 1. Pulmonary hypertension:  RHC (1/24) showed moderate pulmonary venous hypertension with elevated PCWP (group 2 PH). 2. Chronic diastolic CHF: Echo in 3/23 with EF 65-70%, mild LVH, normal RV, PASP 60 mmHg, moderate biatrial enlargement, mild MR, moderate TR. RHC in 5/23 showed elevated PCWP with prominent v-waves.  Echo from 3/23 reviewed, no more than mild MR so suspect elevated v-waves due to diastolic dysfunction.  She had pulmonary venous hypertension. She underwent RHC again in 1/24 showing elevated R/L heart filling pressures, prominent V-waves in PCWP tracing, preserved CO and moderate pulmonary venous hypertension. Echo 2/24 showed stable EF 55-60%, normal RV, mild MR, moderate-severe TR with dilated IVC. TEE in 3/24 showed EF 55-60%, mild RV enlargement with mildly decreased RV systolic function, mild MR,  severe TR (PPM lead not fused to  leaflets), small PFO.  Echo 04/2023 LVEF normal RV mildly decreased TR mild but on Dr Montez Ape read mod-severe. NYHA II-III. Functional status impacted by Parkinsons.  - Appears euvolemic. Continue torsemide  80 qam/60 qpm. - Continue spironolactone  50 mg daily.   - Off Farxiga  due to persistent UTI.  - I reviewed BMET from 06/15/23, stable.  3. Tricuspid regurgitation: Echo 2/24 showed moderate to severe TR. TEE was done in 3/24, showing severe TR with ERO 0.43 cm^2 by PISA, regurgitant volume 50 cc, PPM does moves independently from leaflets and does not appear to be fused, there is mild RV dilation/mild RV dysfunction.  Suspecct  TR plays a significant role in her CHF.  She had evaluation at [facility] clinic and was determined not to be a suitable candidate for tricuspid valve percutaneous repair (Triclip or Evoque) even if we were to remove her pacemaker lead and use a leadless pacemaker instead.. Continue medical management.  4. Atrial fibrillation: Paroxysmal. She has had an epicardial and endocardial ablation in the past.  She failed Tikosyn  in the past and did not tolerate amiodarone  due to side effects.   Per EP, not candidate for redo ablation. Brief A fin on device interrogation.  - Continue Eliquis . No bleeding issues.  - Continue Toprol  XL.  5. Tachy-brady syndrome: Has MDT PPM. Had gen change 07/17/21 6. Cirrhosis: ?NAFLD.  Does not drink any ETOH now, was not a heavy drinker before.  - Followed by [facility].  7. Anemia, chronic. She has Fe deficiency.  8. Chest Pain Had Myoview  2021. Chest pain earlier this morning that went away without intervention. Currently asymptomatic. Discussed with Dr Mitzie Anda. Need cardiac PET to assess for ischemia.   Follow up with Dr Mitzie Anda in 6 weeks.   Cadence Haslam  NP-C  07/14/2023

## 2023-07-15 ENCOUNTER — Other Ambulatory Visit: Payer: Self-pay | Admitting: Internal Medicine

## 2023-07-15 DIAGNOSIS — Z1231 Encounter for screening mammogram for malignant neoplasm of breast: Secondary | ICD-10-CM

## 2023-07-15 LAB — CUP PACEART REMOTE DEVICE CHECK
Battery Remaining Longevity: 107 mo
Battery Voltage: 3.01 V
Brady Statistic AP VP Percent: 71.81 %
Brady Statistic AP VS Percent: 11.36 %
Brady Statistic AS VP Percent: 9.77 %
Brady Statistic AS VS Percent: 7.06 %
Brady Statistic RA Percent Paced: 87.35 %
Brady Statistic RV Percent Paced: 82.32 %
Date Time Interrogation Session: 20250611040036
Implantable Lead Connection Status: 753985
Implantable Lead Connection Status: 753985
Implantable Lead Implant Date: 20140917
Implantable Lead Implant Date: 20140917
Implantable Lead Location: 753859
Implantable Lead Location: 753860
Implantable Pulse Generator Implant Date: 20230614
Lead Channel Impedance Value: 342 Ohm
Lead Channel Impedance Value: 361 Ohm
Lead Channel Impedance Value: 380 Ohm
Lead Channel Impedance Value: 399 Ohm
Lead Channel Pacing Threshold Amplitude: 0.75 V
Lead Channel Pacing Threshold Amplitude: 0.75 V
Lead Channel Pacing Threshold Pulse Width: 0.4 ms
Lead Channel Pacing Threshold Pulse Width: 0.4 ms
Lead Channel Sensing Intrinsic Amplitude: 0.25 mV
Lead Channel Sensing Intrinsic Amplitude: 0.25 mV
Lead Channel Sensing Intrinsic Amplitude: 13.75 mV
Lead Channel Sensing Intrinsic Amplitude: 13.75 mV
Lead Channel Setting Pacing Amplitude: 1.5 V
Lead Channel Setting Pacing Amplitude: 2 V
Lead Channel Setting Pacing Pulse Width: 0.4 ms
Lead Channel Setting Sensing Sensitivity: 1.2 mV
Zone Setting Status: 755011
Zone Setting Status: 755011

## 2023-07-16 ENCOUNTER — Ambulatory Visit (INDEPENDENT_AMBULATORY_CARE_PROVIDER_SITE_OTHER): Payer: Medicare Other

## 2023-07-16 DIAGNOSIS — I495 Sick sinus syndrome: Secondary | ICD-10-CM | POA: Diagnosis not present

## 2023-07-21 ENCOUNTER — Ambulatory Visit: Payer: Self-pay | Admitting: Cardiology

## 2023-07-22 DIAGNOSIS — I10 Essential (primary) hypertension: Secondary | ICD-10-CM | POA: Diagnosis not present

## 2023-07-22 DIAGNOSIS — S0083XA Contusion of other part of head, initial encounter: Secondary | ICD-10-CM | POA: Diagnosis not present

## 2023-07-22 DIAGNOSIS — Z7901 Long term (current) use of anticoagulants: Secondary | ICD-10-CM | POA: Diagnosis not present

## 2023-07-22 DIAGNOSIS — I4891 Unspecified atrial fibrillation: Secondary | ICD-10-CM | POA: Diagnosis not present

## 2023-07-22 DIAGNOSIS — E079 Disorder of thyroid, unspecified: Secondary | ICD-10-CM | POA: Diagnosis not present

## 2023-07-22 DIAGNOSIS — S0993XA Unspecified injury of face, initial encounter: Secondary | ICD-10-CM | POA: Diagnosis not present

## 2023-07-22 DIAGNOSIS — S199XXA Unspecified injury of neck, initial encounter: Secondary | ICD-10-CM | POA: Diagnosis not present

## 2023-07-22 DIAGNOSIS — S0990XA Unspecified injury of head, initial encounter: Secondary | ICD-10-CM | POA: Diagnosis not present

## 2023-07-22 DIAGNOSIS — S022XXA Fracture of nasal bones, initial encounter for closed fracture: Secondary | ICD-10-CM | POA: Diagnosis not present

## 2023-07-22 DIAGNOSIS — Z79899 Other long term (current) drug therapy: Secondary | ICD-10-CM | POA: Diagnosis not present

## 2023-07-24 ENCOUNTER — Telehealth: Payer: Self-pay | Admitting: Neurology

## 2023-07-24 NOTE — Telephone Encounter (Signed)
 Pt has been having terrible leg cramps at night and they are starting to happen during the day. She is wondering what she can do for them?

## 2023-07-27 NOTE — Telephone Encounter (Signed)
 Pateitn has kept no journal and really not sure when or why the cramps are happening. She takes Rytary  at bedtime

## 2023-07-28 DIAGNOSIS — K922 Gastrointestinal hemorrhage, unspecified: Secondary | ICD-10-CM | POA: Diagnosis not present

## 2023-07-28 DIAGNOSIS — I272 Pulmonary hypertension, unspecified: Secondary | ICD-10-CM | POA: Diagnosis not present

## 2023-07-28 DIAGNOSIS — I480 Paroxysmal atrial fibrillation: Secondary | ICD-10-CM | POA: Diagnosis not present

## 2023-07-28 DIAGNOSIS — R252 Cramp and spasm: Secondary | ICD-10-CM | POA: Diagnosis not present

## 2023-07-28 DIAGNOSIS — I509 Heart failure, unspecified: Secondary | ICD-10-CM | POA: Diagnosis not present

## 2023-07-28 DIAGNOSIS — R945 Abnormal results of liver function studies: Secondary | ICD-10-CM | POA: Diagnosis not present

## 2023-07-28 DIAGNOSIS — I110 Hypertensive heart disease with heart failure: Secondary | ICD-10-CM | POA: Diagnosis not present

## 2023-07-28 DIAGNOSIS — K219 Gastro-esophageal reflux disease without esophagitis: Secondary | ICD-10-CM | POA: Diagnosis not present

## 2023-07-28 DIAGNOSIS — I5032 Chronic diastolic (congestive) heart failure: Secondary | ICD-10-CM | POA: Diagnosis not present

## 2023-07-28 DIAGNOSIS — G20A1 Parkinson's disease without dyskinesia, without mention of fluctuations: Secondary | ICD-10-CM | POA: Diagnosis not present

## 2023-07-28 DIAGNOSIS — Z950 Presence of cardiac pacemaker: Secondary | ICD-10-CM | POA: Diagnosis not present

## 2023-07-28 DIAGNOSIS — K7460 Unspecified cirrhosis of liver: Secondary | ICD-10-CM | POA: Diagnosis not present

## 2023-08-10 ENCOUNTER — Other Ambulatory Visit (HOSPITAL_COMMUNITY): Payer: Self-pay | Admitting: Family Medicine

## 2023-08-10 ENCOUNTER — Telehealth: Payer: Self-pay | Admitting: Neurology

## 2023-08-10 ENCOUNTER — Other Ambulatory Visit (HOSPITAL_COMMUNITY): Payer: Self-pay | Admitting: Cardiology

## 2023-08-10 NOTE — Telephone Encounter (Signed)
 Pt needs to speak to someone about her having hallucinations

## 2023-08-10 NOTE — Telephone Encounter (Signed)
 Called patient she stated she was having Hallucinations , She fell twice no energy weak legs no strength. Patient said it all started with the beginning of taking Amantadine . She has D/C that Amantadine  and sis say it helped sleep and leg cramps but not worth the other effects. I did tell patient I would put in a remind me to call on Friday and check on her

## 2023-08-11 ENCOUNTER — Encounter (HOSPITAL_COMMUNITY): Payer: Self-pay

## 2023-08-11 DIAGNOSIS — H353211 Exudative age-related macular degeneration, right eye, with active choroidal neovascularization: Secondary | ICD-10-CM | POA: Diagnosis not present

## 2023-08-11 DIAGNOSIS — H02886 Meibomian gland dysfunction of left eye, unspecified eyelid: Secondary | ICD-10-CM | POA: Diagnosis not present

## 2023-08-11 DIAGNOSIS — H353221 Exudative age-related macular degeneration, left eye, with active choroidal neovascularization: Secondary | ICD-10-CM | POA: Diagnosis not present

## 2023-08-11 DIAGNOSIS — H35721 Serous detachment of retinal pigment epithelium, right eye: Secondary | ICD-10-CM | POA: Diagnosis not present

## 2023-08-11 DIAGNOSIS — H02883 Meibomian gland dysfunction of right eye, unspecified eyelid: Secondary | ICD-10-CM | POA: Diagnosis not present

## 2023-08-11 DIAGNOSIS — H4052X3 Glaucoma secondary to other eye disorders, left eye, severe stage: Secondary | ICD-10-CM | POA: Diagnosis not present

## 2023-08-11 DIAGNOSIS — H353132 Nonexudative age-related macular degeneration, bilateral, intermediate dry stage: Secondary | ICD-10-CM | POA: Diagnosis not present

## 2023-08-11 DIAGNOSIS — H43812 Vitreous degeneration, left eye: Secondary | ICD-10-CM | POA: Diagnosis not present

## 2023-08-12 ENCOUNTER — Encounter (HOSPITAL_COMMUNITY)
Admission: RE | Admit: 2023-08-12 | Discharge: 2023-08-12 | Disposition: A | Discharge status: Home / Self Care | Source: Ambulatory Visit | Attending: Adult Health | Admitting: Adult Health

## 2023-08-12 DIAGNOSIS — R079 Chest pain, unspecified: Secondary | ICD-10-CM | POA: Insufficient documentation

## 2023-08-12 MED ORDER — RUBIDIUM RB82 GENERATOR (RUBYFILL)
13.3000 | PACK | Freq: Once | INTRAVENOUS | Status: AC
  Administered 2023-08-12: 13.28 via INTRAVENOUS

## 2023-08-12 MED ORDER — RUBIDIUM RB82 GENERATOR (RUBYFILL)
13.3100 | PACK | Freq: Once | INTRAVENOUS | Status: AC
  Administered 2023-08-12: 13.12 via INTRAVENOUS

## 2023-08-12 MED ORDER — REGADENOSON 0.4 MG/5ML IV SOLN
INTRAVENOUS | Status: AC
  Filled 2023-08-12: qty 5

## 2023-08-12 MED ORDER — REGADENOSON 0.4 MG/5ML IV SOLN
0.4000 mg | Freq: Once | INTRAVENOUS | Status: AC
  Administered 2023-08-12: 0.4 mg via INTRAVENOUS

## 2023-08-13 ENCOUNTER — Ambulatory Visit (HOSPITAL_COMMUNITY): Payer: Self-pay | Admitting: Adult Health

## 2023-08-13 LAB — NM PET CT CARDIAC PERFUSION MULTI W/ABSOLUTE BLOODFLOW
MBFR: 1.66
Nuc Rest EF: 58 %
Nuc Stress EF: 61 %
Rest MBF: 1.05 ml/g/min
Rest Nuclear Isotope Dose: 13.1 mCi
Stress MBF: 1.74 ml/g/min
Stress Nuclear Isotope Dose: 13.2 mCi
TID: 0.8

## 2023-08-13 NOTE — Telephone Encounter (Signed)
 Pt aware, agreeable, and verbalized understanding

## 2023-08-21 ENCOUNTER — Telehealth: Payer: Self-pay | Admitting: Home Health

## 2023-08-21 ENCOUNTER — Ambulatory Visit (HOSPITAL_COMMUNITY)
Admission: RE | Admit: 2023-08-21 | Discharge: 2023-08-21 | Disposition: A | Discharge status: Home / Self Care | Source: Ambulatory Visit | Attending: Cardiology | Admitting: Cardiology

## 2023-08-21 ENCOUNTER — Ambulatory Visit (HOSPITAL_COMMUNITY): Payer: Self-pay | Admitting: Cardiology

## 2023-08-21 VITALS — BP 114/62 | HR 74 | Ht 61.0 in | Wt 109.8 lb

## 2023-08-21 DIAGNOSIS — I110 Hypertensive heart disease with heart failure: Secondary | ICD-10-CM | POA: Diagnosis not present

## 2023-08-21 DIAGNOSIS — D509 Iron deficiency anemia, unspecified: Secondary | ICD-10-CM | POA: Insufficient documentation

## 2023-08-21 DIAGNOSIS — I4811 Longstanding persistent atrial fibrillation: Secondary | ICD-10-CM | POA: Diagnosis not present

## 2023-08-21 DIAGNOSIS — I272 Pulmonary hypertension, unspecified: Secondary | ICD-10-CM | POA: Diagnosis not present

## 2023-08-21 DIAGNOSIS — Z950 Presence of cardiac pacemaker: Secondary | ICD-10-CM | POA: Insufficient documentation

## 2023-08-21 DIAGNOSIS — I480 Paroxysmal atrial fibrillation: Secondary | ICD-10-CM | POA: Diagnosis not present

## 2023-08-21 DIAGNOSIS — Z7901 Long term (current) use of anticoagulants: Secondary | ICD-10-CM | POA: Insufficient documentation

## 2023-08-21 DIAGNOSIS — Z79899 Other long term (current) drug therapy: Secondary | ICD-10-CM | POA: Diagnosis not present

## 2023-08-21 DIAGNOSIS — E875 Hyperkalemia: Secondary | ICD-10-CM

## 2023-08-21 DIAGNOSIS — K7460 Unspecified cirrhosis of liver: Secondary | ICD-10-CM | POA: Diagnosis not present

## 2023-08-21 DIAGNOSIS — I495 Sick sinus syndrome: Secondary | ICD-10-CM | POA: Diagnosis not present

## 2023-08-21 DIAGNOSIS — R079 Chest pain, unspecified: Secondary | ICD-10-CM | POA: Insufficient documentation

## 2023-08-21 DIAGNOSIS — G20A1 Parkinson's disease without dyskinesia, without mention of fluctuations: Secondary | ICD-10-CM | POA: Diagnosis not present

## 2023-08-21 DIAGNOSIS — I5032 Chronic diastolic (congestive) heart failure: Secondary | ICD-10-CM | POA: Insufficient documentation

## 2023-08-21 LAB — IRON AND TIBC
Iron: 43 ug/dL (ref 28–170)
Saturation Ratios: 9 % — ABNORMAL LOW (ref 10.4–31.8)
TIBC: 466 ug/dL — ABNORMAL HIGH (ref 250–450)
UIBC: 423 ug/dL

## 2023-08-21 LAB — BASIC METABOLIC PANEL WITH GFR
Anion gap: 10 (ref 5–15)
BUN: 29 mg/dL — ABNORMAL HIGH (ref 8–23)
CO2: 26 mmol/L (ref 22–32)
Calcium: 9.5 mg/dL (ref 8.9–10.3)
Chloride: 97 mmol/L — ABNORMAL LOW (ref 98–111)
Creatinine, Ser: 1.68 mg/dL — ABNORMAL HIGH (ref 0.44–1.00)
GFR, Estimated: 30 mL/min — ABNORMAL LOW (ref >60)
Glucose, Bld: 151 mg/dL — ABNORMAL HIGH (ref 70–99)
Potassium: 5.5 mmol/L — ABNORMAL HIGH (ref 3.5–5.1)
Sodium: 133 mmol/L — ABNORMAL LOW (ref 135–145)

## 2023-08-21 LAB — FERRITIN: Ferritin: 17 ng/mL (ref 11–307)

## 2023-08-21 LAB — BRAIN NATRIURETIC PEPTIDE: B Natriuretic Peptide: 134.8 pg/mL — ABNORMAL HIGH (ref 0.0–100.0)

## 2023-08-21 MED ORDER — APIXABAN 2.5 MG PO TABS: 2.5000 mg | ORAL_TABLET | Freq: Two times a day (BID) | ORAL | 11 refills | Status: AC

## 2023-08-21 NOTE — Telephone Encounter (Signed)
  Received page from Dr Rolan, patient had critical K 5.5 today. He advised the patient with instruction below:   Hold torsemide  x 1 day (tomorrow). Resume Sunday torsemide  at 60 qam, 40 qpm. Stop KCl supplement. She will need IV iron infusion. Repeat BMET late next week.   Above information called to the patient. She states she won't return to [place] until 08/31/23, is currently out of town. Will forward this message to Dr Rolan, if ok BMP to be done on 08/31/23. If not please reach out to the patient to make other arrangement.   BMP ordered.

## 2023-08-21 NOTE — Patient Instructions (Signed)
 Please make sure you are taking Eliquis  2.5 mg Twice daily  Labs done today, your results will be available in MyChart, we will contact you for abnormal readings.  Your physician recommends that you schedule a follow-up appointment in: 3 months.  If you have any questions or concerns before your next appointment please send us  a message through [place] or call our office at [phone].    TO LEAVE A MESSAGE FOR THE NURSE SELECT OPTION 2, PLEASE LEAVE A MESSAGE INCLUDING: YOUR NAME DATE OF BIRTH CALL BACK NUMBER REASON FOR CALL**this is important as we prioritize the call backs  YOU WILL RECEIVE A CALL BACK THE SAME DAY AS LONG AS YOU CALL BEFORE 4:00 PM  At the [facility], you and your health needs are our priority. As part of our continuing mission to provide you with exceptional heart care, we have created designated Provider Care Teams. These Care Teams include your primary Cardiologist (physician) and Advanced Practice Providers (APPs- Physician Assistants and Nurse Practitioners) who all work together to provide you with the care you need, when you need it.   You may see any of the following providers on your designated Care Team at your next follow up: Dr Toribio Fuel Dr Ezra Shuck Dr. Ria Commander Dr. Morene Brownie Amy Lenetta, NP Caffie Shed, [place] [facility] [place], [place] Beckey Coe, NP [place] Lee, NP Ellouise Class, NP Tinnie Redman, PharmD Jaun Bash, PharmD   Please be sure to bring in all your medications bottles to every appointment.    Thank you for choosing [facility]

## 2023-08-23 NOTE — Progress Notes (Signed)
 PCP: Shayne Anes, MD EP: Dr. Inocencio HF Cardiology: Dr. Rolan  Chief complaint: CHF  83 y.o. 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  Patient had an echo done in 3/23 showing EF 65-70%, mild LVH, normal RV, PASP 60 mmHg, moderate biatrial enlargement, mild MR, moderate TR. Prior echoes had shown mildly elevated PA pressure. Cardiolite in 10/21 showed no ischemia.   RHC was done in 5/23 showing mild pulmonary venous hypertension with mildly elevated PCWP. She was started on Farxiga .  She developed a persistent UTI that was difficult to clear and stopped Farxiga .   Acute follow up 02/20/22 for dyspnea. CTA chest showed no PE, moderate right pleural effusion, mild pulmonary edema. NYHA III symptoms and volume overloaded, she was given IV lasix  80 mg x 1 then instructed to use [name]  daily x 2 days, then switch from Lasix  to torsemide  60 mg daily. RHC arranged which showed elevated R/L heart filling pressures, prominent V waves in PCWP tracing, preserved CO and moderate pulmonary venous hypertension. Echo arranged to assess MR and torsemide  increased to 60/40.  S/p R therapeutic thoracentesis 02/25/22 yielding 350 ml fluid.  Echo 2/24 showed EF 55-60%, normal RV, mild MR, moderate-severe TR with dilated IVC.  TEE in 3/24 showed EF 55-60%, mild RV enlargement with mildly decreased RV systolic function, mild MR, severe TR (PPM lead not fused to leaflets), small PFO.   Follow up with EP 06/23/22, device interrogation showing 8% burden of AT/AF. Planning to re-evaluate after possible TR intervention.  Saw Dr. Consepcion @ [facility] in 5/24 and had workup for percutaneous tricuspid interventions (Triclip, Evoque valve).  She was determined not to be a candidate for any of the available tricuspid interventions.    Echo in  3/25 showed EF 55-60%, mild RV dilation with mildly decreased RV systolic function, moderate-severe TR (on my read).   She had a prolonged episode of chest pain, cardiac PET in 7/25 showed small reversible perfusion defect at the apex.  This finding was low risk, but there was also decreased myocardial blood flow reserve most likely from microvascular disease given no coronary calcium  seen on CT.   She saw Dr. Inocencio; he did not think she was a candidate for repeat AF ablation.   Today she returns for HF follow up with her husband.   She has had no further chest pain.  Main complaint recently has been worsening of her Parkinsons symptoms after changing her medication.  She has been more tremulous and unsteady.  No dyspnea walking on flat ground but not as active due to worsening of Parkinsons symptoms. Uses a walker for stability.  No orthopnea/PND. Weight down 4 lbs.   Medtronic device interrogation: 7.5% atrial fibrillation  ECG (personally reviewed): A-V sequential pacing  Labs (5/25): hgb 9.9, K 4.1, creatinine 1.19  PMH: 1. Atrial fibrillation: Paroxysmal.  S/p AF (epicardial and endocardial) ablation in 10/13.  2. Tachy-brady syndrome: Medtronic PPM placed in 9/14.  3. H/o atypical atrial flutter 4. Hyperlipidemia 5. HTN 6. Hypothyroidism 7. Parkinsons disease: Followed by Dr. Evonnie 8. Cirrhosis: ?NAFLD. No ETOH.  9. Chronic diastolic CHF/pulmonary hypertension:  - LHC (2014): No significant CAD.  - Cardiolite (10/21): EF 71%, no ischemia.  - Echo (3/23): EF 65-70%, mild LVH, normal RV, PASP 60 mmHg, moderate biatrial enlargement, mild MR, moderate TR.  - RHC (5/23): mean RA 8,  PA 49/13 mean 30, mean PCWP 19 with v-waves to 30, CI 3.96, PVR 1.8 WU.  - RHC (2/24): RA mean 12, PA 53/24 (mean 39) PCWP mean 23 with prominent v-waves to 35, CO/CI (Fick) 6.75/4.23, PVR 2.4 WU - Echo (2/24): EF 55-60%, normal RV, mild MR, moderate-severe TR with dilated IVC. - TEE (3/24): EF 55-60%, mild  RV enlargement with mildly decreased RV systolic function, mild MR, severe TR (PPM lead not fused to leaflets), small PFO.  - Echo (3/25): EF 55-60%, mild RV dilation with mildly decreased RV systolic function, moderate-severe TR (on my read).  10. B12 deficiency.  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  - Seen at [facility] in [place], not candidate for percutaneous tricuspid repair.  12. Cardiac PET (7/25): small reversible perfusion defect at the apex.  This finding was low risk, but there was also decreased myocardial blood flow reserve most likely from microvascular disease given no coronary calcium  seen on CT.   Social History   Socioeconomic History   Marital status: Married    Spouse name: Not on file   Number of children: 2   Years of education: Not on file   Highest education level: Master's degree (e.g., MA, MS, MEng, MEd, MSW, MBA)  Occupational History   Not on file  Tobacco Use   Smoking status: Never   Smokeless tobacco: Never  Vaping Use   Vaping status: Never Used  Substance and Sexual Activity   Alcohol use: No   Drug use: No   Sexual activity: Yes    Partners: Male    Birth control/protection: Post-menopausal  Other Topics Concern   Not on file  Social History Narrative   Lives in [place].  Retired Comptroller.   Right handed    Social Drivers of Health   Financial Resource Strain: Not on file  Food Insecurity: Low Risk  (04/15/2023)   Received from [facility]   Hunger Vital Sign    Within the past 12 months, you worried that your food would run out before you got money to buy more: Never true    Within the past 12 months, the food you bought just didn't last and you didn't have money to get more. : Never true  Transportation Needs: No Transportation Needs (04/15/2023)   Received from [facility]     In the past 12 months, has lack of reliable transportation kept you from medical appointments, meetings, work or from getting things needed for daily living? : No  Physical Activity: Not on file  Stress: Not on file  Social Connections: Not on file  Intimate Partner Violence: Not At Risk (07/22/2023)   Received from [facility]   HITS    Over the last 12 months how often did your partner physically hurt you?: Never    Over the last 12 months how often did your partner insult you or talk down to you?: Never    Over the last 12 months how often did your partner threaten you with physical harm?: Never    Over the last 12 months how often did your partner scream or curse at you?: Never   Family History  Problem Relation Age of Onset   Alzheimer's disease Mother    Heart failure Father    Healthy Child    Healthy Child  Breast cancer Maternal Aunt    ROS: All systems reviewed and negative except as per HPI.   Current Outpatient Medications  Medication Sig Dispense Refill   Acetylcysteine (NAC 600) 600 MG CAPS Take 600 mg by mouth in the morning.     AMBULATORY NON FORMULARY MEDICATION Rollator Dx: G20.B2 1 Device 0   amoxicillin (AMOXIL) 500 MG capsule SMARTSIG:4 Capsule(s) By Mouth Once (Patient taking differently: As needed for dental procedures)     B-D 3CC LUER-LOK SYR 25GX1 25G X 1 3 ML MISC Inject 1 mL into the muscle once a week.     BELSOMRA 20 MG TABS Take 20 mg by mouth at bedtime as needed (sleep).     Calcium  Citrate-Vitamin D  (CITRACAL + D PO) Take 1 tablet by mouth every evening.     carbidopa -levodopa  (SINEMET  IR) 25-100 MG tablet Take 2 tablets by mouth 3 (three) times daily. 540 tablet 1   Carbidopa -Levodopa  ER (RYTARY ) 36.25-145 MG CPCR Take 2 tablets by mouth at bedtime. 180 capsule 1   cholecalciferol (VITAMIN D3) 25 MCG (1000 UNIT) tablet Take 1,000 Units by mouth in the morning.     cyanocobalamin  (,VITAMIN B-12,) 1000 MCG/ML injection INJECT 1ML  INTRAMUSCULARLY IN THE DELTOID ONCE A MONTH (ALTERNATING DELT. EACH MONTH)     Cyanocobalamin  (VITAMIN B12) 1000 MCG TBCR Take 1,000 mcg by mouth in the morning.     docusate sodium  (COLACE) 100 MG capsule Take 100 mg by mouth 2 (two) times daily.     GLUTATHIONE PO Take 2 capsules by mouth in the morning.     levothyroxine  (SYNTHROID ) 50 MCG tablet Take 50-100 mcg by mouth See admin instructions. Take 2 tablets (100 mcg) by mouth on Mondays and Thursdays in the morning. Take 1 tablet (50 mcg) by mouth on Sundays, Tuesdays, Wednesdays, Fridays & Saturdays.     magnesium oxide (MAG-OX) 400 MG tablet Take 400 mg by mouth at bedtime.     metoprolol  succinate (TOPROL -XL) 25 MG 24 hr tablet TAKE ONE TABLET AT BEDTIME 30 tablet 6   NON FORMULARY Take 1 tablet by mouth at bedtime. MetaCalm Stress Regulation Formula     pantoprazole (PROTONIX) 40 MG tablet Take 40 mg by mouth 2 (two) times daily.     polyethylene glycol (MIRALAX ) 17 g packet Take by mouth. (Patient taking differently: Take 17 g by mouth as needed.)     potassium chloride  (MICRO-K ) 10 MEQ CR capsule TAKE 6 CAPSULES EVERY MORNING AND AT NOON 360 capsule 3   rasagiline  (AZILECT ) 1 MG TABS tablet Take 1 tablet (1 mg total) by mouth daily. 90 tablet 1   spironolactone  (ALDACTONE ) 25 MG tablet Take 2 tablets (50 mg total) by mouth daily. 180 tablet 3   torsemide  (DEMADEX ) 20 MG tablet Take 4 tablets (80 mg total) by mouth every morning AND 3 tablets (60 mg total) every evening. 300 tablet 3   vitamin C (ASCORBIC ACID) 500 MG tablet Take 500 mg by mouth in the morning.     apixaban  (ELIQUIS ) 2.5 MG TABS tablet Take 1 tablet (2.5 mg total) by mouth 2 (two) times daily. 60 tablet 11   No current facility-administered medications for this encounter.   Facility-Administered Medications Ordered in Other Encounters  Medication Dose Route Frequency Provider Last Rate Last Admin   sodium chloride  flush (NS) 0.9 % injection 10 mL  10 mL Intravenous  Q12H Amalya, Salmons, PA-C       BP 114/62   Pulse 74  Ht 5' 1 (1.549 m)   Wt 49.8 kg (109 lb 12.8 oz)   LMP 02/04/1996   SpO2 100%   BMI 20.75 kg/m   Wt Readings from Last 3 Encounters:  08/21/23 49.8 kg (109 lb 12.8 oz)  07/14/23 51.3 kg (113 lb 3.2 oz)  07/06/23 55.2 kg (121 lb 12.8 oz)  General: NAD Neck: No JVD, no thyromegaly or thyroid  nodule.  Lungs: Clear to auscultation bilaterally with normal respiratory effort. CV: Nondisplaced PMI.  Heart regular S1/S2, no S3/S4, 2/6 HSM LLSB.  No peripheral edema.  No carotid bruit.  Normal pedal pulses.  Abdomen: Soft, nontender, no hepatosplenomegaly, no distention.  Skin: Intact without lesions or rashes.  Neurologic: Alert and oriented x 3. Tremulous.  Psych: Normal affect. Extremities: No clubbing or cyanosis.  HEENT: Normal.   Assessment/Plan: 1. Pulmonary hypertension:  RHC (1/24) showed moderate pulmonary venous hypertension with elevated PCWP (group 2 PH). 2. Chronic diastolic CHF: Echo in 3/23 with EF 65-70%, mild LVH, normal RV, PASP 60 mmHg, moderate biatrial enlargement, mild MR, moderate TR. RHC in 5/23 showed elevated PCWP with prominent v-waves.  Echo from 3/23 reviewed, no more than mild MR so suspect elevated v-waves due to diastolic dysfunction.  She had pulmonary venous hypertension. She underwent RHC again in 1/24 showing elevated R/L heart filling pressures, prominent V-waves in PCWP tracing, preserved CO and moderate pulmonary venous hypertension. Echo 2/24 showed stable EF 55-60%, normal RV, mild MR, moderate-severe TR with dilated IVC. TEE in 3/24 showed EF 55-60%, mild RV enlargement with mildly decreased RV systolic function, mild MR, severe TR (PPM lead not fused to leaflets), small PFO. Echo in 3/25 showed EF 55-60%, mild RV dilation with mildly decreased RV systolic function, moderate-severe TR (on my read). NYHA II-III, symptoms confounded by worsening Parkinsons symptoms. She does not look volume overloaded  on exam.  - Continue torsemide  80 qam/60 qpm.  BMET/BNP today.  - Continue spironolactone  50 mg daily.   - Off Farxiga  due to persistent UTI.  3. Tricuspid regurgitation: Echo 2/24 showed moderate to severe TR. TEE was done in 3/24, showing severe TR with ERO 0.43 cm^2 by PISA, regurgitant volume 50 cc, PPM does moves independently from leaflets and does not appear to be fused, there is mild RV dilation/mild RV dysfunction.  Suspecct  TR plays a significant role in her CHF.  She had evaluation at [facility] clinic and was determined not to be a suitable candidate for tricuspid valve percutaneous repair (Triclip or Evoque) even if we were to remove her pacemaker lead and use a leadless pacemaker instead. Continue medical management.  4. Atrial fibrillation: Paroxysmal. She has had an epicardial and endocardial ablation in the past.  She failed Tikosyn  in the past and did not tolerate amiodarone  due to side effects. Per EP, not candidate for redo ablation. About 7.5% atrial fibrillation on today's device interrogation.  - Continue Eliquis .  - Continue Toprol  XL.  5. Tachy-brady syndrome: Has MDT PPM.  6. Cirrhosis: ?NAFLD.  Does not drink any ETOH now, was not a heavy drinker before.  - Followed by [facility].  7. Anemia, chronic. She has Fe deficiency.  - Check Fe studies.  8. Chest Pain: Cardiac PET in 7/25 showed small reversible perfusion defect at the apex.  This finding was low risk, but there was also decreased myocardial blood flow reserve most likely from microvascular disease given no coronary calcium  seen on CT. No further chest pain.  - If she develops further atypical  chest pain, would be reasonable to do a coronary CTA.   Followup 2 months with APP.   I spent 31 minutes reviewing records, interviewing/examining patient, and managing orders.   Ezra Shuck  08/23/2023

## 2023-08-24 ENCOUNTER — Other Ambulatory Visit (HOSPITAL_COMMUNITY): Payer: Self-pay | Admitting: *Deleted

## 2023-08-24 DIAGNOSIS — D508 Other iron deficiency anemias: Secondary | ICD-10-CM

## 2023-08-24 DIAGNOSIS — I5032 Chronic diastolic (congestive) heart failure: Secondary | ICD-10-CM

## 2023-08-24 NOTE — Telephone Encounter (Signed)
 Called patient per Dr. Rolan with following lab results and instructions:  Hold torsemide  x 1 day. Resume at 60 qam, 40 qpm. Stop KCl supplement. She will need IV iron infusion. Repeat BMET late next week.  Pt verbalized understanding of above. IV iron ordered, we will call and schedule with patient when insurance prior authorization is obtained. Repeat lab ordered and scheduled.

## 2023-08-31 ENCOUNTER — Ambulatory Visit (HOSPITAL_COMMUNITY)
Admission: RE | Admit: 2023-08-31 | Discharge: 2023-08-31 | Disposition: A | Discharge status: Home / Self Care | Source: Ambulatory Visit | Attending: Cardiology | Admitting: Cardiology

## 2023-09-01 ENCOUNTER — Ambulatory Visit
Admission: RE | Admit: 2023-09-01 | Discharge: 2023-09-01 | Disposition: A | Discharge status: Home / Self Care | Source: Ambulatory Visit | Attending: Internal Medicine | Admitting: Internal Medicine

## 2023-09-01 DIAGNOSIS — Z1231 Encounter for screening mammogram for malignant neoplasm of breast: Secondary | ICD-10-CM | POA: Diagnosis not present

## 2023-09-08 ENCOUNTER — Telehealth (HOSPITAL_COMMUNITY): Payer: Self-pay | Admitting: *Deleted

## 2023-09-08 DIAGNOSIS — I5032 Chronic diastolic (congestive) heart failure: Secondary | ICD-10-CM | POA: Diagnosis not present

## 2023-09-08 DIAGNOSIS — E875 Hyperkalemia: Secondary | ICD-10-CM | POA: Diagnosis not present

## 2023-09-08 NOTE — Telephone Encounter (Signed)
 Pt called to inquire about lab results from last week. No results in chart, [place], lab order faxed to [facility] in [place] [phone]) she will go there for labs today

## 2023-09-09 ENCOUNTER — Ambulatory Visit (HOSPITAL_COMMUNITY): Payer: Self-pay | Admitting: Cardiology

## 2023-09-09 LAB — BASIC METABOLIC PANEL WITH GFR
BUN/Creatinine Ratio: 22 (ref 12–28)
BUN: 33 mg/dL — ABNORMAL HIGH (ref 8–27)
CO2: 24 mmol/L (ref 20–29)
Calcium: 9 mg/dL (ref 8.7–10.3)
Chloride: 90 mmol/L — ABNORMAL LOW (ref 96–106)
Creatinine, Ser: 1.47 mg/dL — ABNORMAL HIGH (ref 0.57–1.00)
Glucose: 132 mg/dL — ABNORMAL HIGH (ref 70–99)
Potassium: 3.7 mmol/L (ref 3.5–5.2)
Sodium: 131 mmol/L — ABNORMAL LOW (ref 134–144)
eGFR: 36 mL/min/1.73 — ABNORMAL LOW (ref >59)

## 2023-09-09 NOTE — Progress Notes (Signed)
 Remote pacemaker transmission.

## 2023-09-10 NOTE — Telephone Encounter (Signed)
 Lab received and reviewed by Dr Rolan 09/09/23

## 2023-09-17 ENCOUNTER — Telehealth (HOSPITAL_COMMUNITY): Payer: Self-pay | Admitting: Pharmacy Technician

## 2023-09-17 NOTE — Telephone Encounter (Signed)
 Auth Submission: NO AUTH NEEDED Site of care: [facility] Payer: Medicare A/B, [employer] Supp Medication & CPT/J Code(s) submitted: Feraheme  (ferumoxytol ) [id number] Diagnosis Code: D50.8, I50.32 Route of submission (phone, fax, portal):  Phone # Fax # Auth type: Buy/Bill HB Units/visits requested: 510mg  x 2 doses Reference number:  Approval from: 09/17/23 to 03/05/24    Dagoberto Armour, CPhT [facility] Phone: [phone] 09/17/2023

## 2023-09-22 ENCOUNTER — Encounter: Payer: Self-pay | Admitting: Oncology

## 2023-09-22 ENCOUNTER — Ambulatory Visit (HOSPITAL_COMMUNITY)
Admission: RE | Admit: 2023-09-22 | Discharge: 2023-09-22 | Disposition: A | Discharge status: Home / Self Care | Source: Ambulatory Visit | Attending: Cardiology | Admitting: Cardiology

## 2023-09-22 DIAGNOSIS — I5032 Chronic diastolic (congestive) heart failure: Secondary | ICD-10-CM | POA: Insufficient documentation

## 2023-09-22 DIAGNOSIS — D508 Other iron deficiency anemias: Secondary | ICD-10-CM | POA: Diagnosis not present

## 2023-09-22 MED ORDER — FERUMOXYTOL INJECTION 510 MG/17 ML: 510.0000 mg | Freq: Once | INTRAVENOUS | Status: DC

## 2023-09-22 MED ORDER — SODIUM CHLORIDE 0.9 % IV SOLN
510.0000 mg | Freq: Once | INTRAVENOUS | Status: AC
  Administered 2023-09-22: 510 mg via INTRAVENOUS
  Filled 2023-09-22: qty 510

## 2023-10-08 DIAGNOSIS — I5032 Chronic diastolic (congestive) heart failure: Secondary | ICD-10-CM | POA: Diagnosis not present

## 2023-10-08 DIAGNOSIS — M810 Age-related osteoporosis without current pathological fracture: Secondary | ICD-10-CM | POA: Diagnosis not present

## 2023-10-08 DIAGNOSIS — Z961 Presence of intraocular lens: Secondary | ICD-10-CM | POA: Diagnosis not present

## 2023-10-08 DIAGNOSIS — H5213 Myopia, bilateral: Secondary | ICD-10-CM | POA: Diagnosis not present

## 2023-10-08 DIAGNOSIS — H04123 Dry eye syndrome of bilateral lacrimal glands: Secondary | ICD-10-CM | POA: Diagnosis not present

## 2023-10-08 DIAGNOSIS — E039 Hypothyroidism, unspecified: Secondary | ICD-10-CM | POA: Diagnosis not present

## 2023-10-08 DIAGNOSIS — I110 Hypertensive heart disease with heart failure: Secondary | ICD-10-CM | POA: Diagnosis not present

## 2023-10-08 DIAGNOSIS — E785 Hyperlipidemia, unspecified: Secondary | ICD-10-CM | POA: Diagnosis not present

## 2023-10-08 DIAGNOSIS — E7849 Other hyperlipidemia: Secondary | ICD-10-CM | POA: Diagnosis not present

## 2023-10-08 DIAGNOSIS — D649 Anemia, unspecified: Secondary | ICD-10-CM | POA: Diagnosis not present

## 2023-10-08 DIAGNOSIS — H353232 Exudative age-related macular degeneration, bilateral, with inactive choroidal neovascularization: Secondary | ICD-10-CM | POA: Diagnosis not present

## 2023-10-12 DIAGNOSIS — J90 Pleural effusion, not elsewhere classified: Secondary | ICD-10-CM | POA: Diagnosis not present

## 2023-10-12 DIAGNOSIS — R0781 Pleurodynia: Secondary | ICD-10-CM | POA: Diagnosis not present

## 2023-10-12 DIAGNOSIS — W19XXXA Unspecified fall, initial encounter: Secondary | ICD-10-CM | POA: Diagnosis not present

## 2023-10-12 DIAGNOSIS — Z719 Counseling, unspecified: Secondary | ICD-10-CM | POA: Diagnosis not present

## 2023-10-13 DIAGNOSIS — D6869 Other thrombophilia: Secondary | ICD-10-CM | POA: Diagnosis not present

## 2023-10-13 DIAGNOSIS — E785 Hyperlipidemia, unspecified: Secondary | ICD-10-CM | POA: Diagnosis not present

## 2023-10-13 DIAGNOSIS — J90 Pleural effusion, not elsewhere classified: Secondary | ICD-10-CM | POA: Diagnosis not present

## 2023-10-13 DIAGNOSIS — K7460 Unspecified cirrhosis of liver: Secondary | ICD-10-CM | POA: Diagnosis not present

## 2023-10-13 DIAGNOSIS — I10 Essential (primary) hypertension: Secondary | ICD-10-CM | POA: Diagnosis not present

## 2023-10-13 DIAGNOSIS — I110 Hypertensive heart disease with heart failure: Secondary | ICD-10-CM | POA: Diagnosis not present

## 2023-10-13 DIAGNOSIS — G20A1 Parkinson's disease without dyskinesia, without mention of fluctuations: Secondary | ICD-10-CM | POA: Diagnosis not present

## 2023-10-13 DIAGNOSIS — I5032 Chronic diastolic (congestive) heart failure: Secondary | ICD-10-CM | POA: Diagnosis not present

## 2023-10-13 DIAGNOSIS — E039 Hypothyroidism, unspecified: Secondary | ICD-10-CM | POA: Diagnosis not present

## 2023-10-13 DIAGNOSIS — Z0000 Encounter for general adult medical examination without abnormal findings: Secondary | ICD-10-CM | POA: Diagnosis not present

## 2023-10-13 DIAGNOSIS — R82998 Other abnormal findings in urine: Secondary | ICD-10-CM | POA: Diagnosis not present

## 2023-10-13 DIAGNOSIS — D649 Anemia, unspecified: Secondary | ICD-10-CM | POA: Diagnosis not present

## 2023-10-13 DIAGNOSIS — E876 Hypokalemia: Secondary | ICD-10-CM | POA: Diagnosis not present

## 2023-10-13 DIAGNOSIS — M810 Age-related osteoporosis without current pathological fracture: Secondary | ICD-10-CM | POA: Diagnosis not present

## 2023-10-13 DIAGNOSIS — Z23 Encounter for immunization: Secondary | ICD-10-CM | POA: Diagnosis not present

## 2023-10-13 DIAGNOSIS — I480 Paroxysmal atrial fibrillation: Secondary | ICD-10-CM | POA: Diagnosis not present

## 2023-10-14 ENCOUNTER — Other Ambulatory Visit (HOSPITAL_COMMUNITY): Payer: Self-pay | Admitting: Cardiology

## 2023-10-14 DIAGNOSIS — D692 Other nonthrombocytopenic purpura: Secondary | ICD-10-CM | POA: Diagnosis not present

## 2023-10-14 DIAGNOSIS — Z85828 Personal history of other malignant neoplasm of skin: Secondary | ICD-10-CM | POA: Diagnosis not present

## 2023-10-14 DIAGNOSIS — L821 Other seborrheic keratosis: Secondary | ICD-10-CM | POA: Diagnosis not present

## 2023-10-14 DIAGNOSIS — L570 Actinic keratosis: Secondary | ICD-10-CM | POA: Diagnosis not present

## 2023-10-14 NOTE — Progress Notes (Unsigned)
 Assessment/Plan:   1.  Parkinsons Disease             - continue carbidopa /levodopa  25/100, 2 tablets three times per day.  We discussed changing all of the dosages to Rytary  to help decreased dyskinesia and decrease off time, but she is nervous about this and wants to continue with IR levodopa , even though the dyskinesia can get severe.  I did tell her she can start taking it 1 tablet 6 times per day if she can remember it that way to see if dyskinesia is less.             - Continue rytary  145 mg, 2 po q hs.    - She is having some cramping, both day and night.  She is not sure if that is when she is missing the late afternoon dosage of levodopa , but is going to pay more attention to this.  If it is more in the evening before she takes the Rytary , we may just move that dose up earlier, or maybe even increase the Rytary  dose at night.  She is to start keeping a log of when it occurs and let me know.             -Continue rasagiline , 1 mg daily             -Has moderate Parkinson's dyskinesia.  She does want to try amantadine .  We talked about the risks and benefits of this, including the risks of confusion.  She will take it with the 2nd and 3rd dose of levodopa .  - Patient needs to increase exercise !             -PT  - declines referral, but I encouraged her to think about that.  - As I was walking out of the office today, she asked about surgical interventions, including DBS therapy.  We did talk for a few minutes about DBS and what it would entail and what it would help with.  I had already been in the office 45 minutes, so we did not talk too long about this, but discussed that she may be a candidate if she would like to be.  She is going to think about this.   2.  RBD/insomnia             -she is no longer on klonopin  but has noted that dreaming vivid again.  Primary care is managing.  She is on Belsomra.  She is not sure that is helping. 3.  Anemia secondary to cirrhosis and B12  deficiency             -Follows with hematology             -Receives IV iron as needed. 4.  Atrial fibrillation             -Follows with cardiology.  On Eliquis .             -Patient with history of tachybradycardia syndrome and follows with cardiology for this as well.  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 5.  Constipation  -discussed nature and pathophysiology and association with PD  -discussed importance of hydration.  Pt is to increase water intake  -pt is given a copy of the rancho recipe  -recommended daily colace  -recommended miralax  prn   Subjective:   [name] [name] was seen today in follow up for Parkinsons disease.  My previous records were reviewed prior to todays visit as  well as outside records available to me.  She was seen last 3 months ago.  She has been on a combination of immediate release levodopa  and Rytary , and has not wanted to change to all Rytary  dosing.  I did add amantadine  last visit.  Unfortunately, even at only twice daily dosing that caused hallucinations and was discontinued.  Last visit, I did ask her to keep a log of when cramping was happening to see if it was occurring when she was missing the late afternoon dosage of levodopa .  She called me a couple weeks after our last visit to complain about cramping, but she had not made note of the times.  I asked her to keep track of that and let me know.  We did not hear back about that.  She reports that cramping persists.  She did not pay attention to the timing of it necessarily.  She did recently have a fall.  She fell onto a stool in her bedroom and hit her ribs.  She ended up following up at [facility] and had chest x-ray and had no rib fractures.  This was just earlier this week.   Current prescribed movement disorder medications: Carbidopa /levodopa  25/100, 2 tablets 3 times per day  Rytary  145 mg, 2 tablets at night  azilect    Prior meds: Carbidopa /levodopa  50/200 CR at bed (did not help with cramping at night);  amantadine  (hallucinations)    ALLERGIES:   Allergies  Allergen Reactions   Amantadine      Hallucinations and not able to walk   Amiodarone  Other (See Comments)    Made pt feel terrible    Statins Other (See Comments)    Leg weakness - but tolerating low dose Crestor  every other day    CURRENT MEDICATIONS:  Outpatient Encounter Medications as of 10/15/2023  Medication Sig   Acetylcysteine (NAC 600) 600 MG CAPS Take 600 mg by mouth in the morning.   AMBULATORY NON FORMULARY MEDICATION Rollator Dx: G20.B2   amoxicillin (AMOXIL) 500 MG capsule SMARTSIG:4 Capsule(s) By Mouth Once (Patient taking differently: As needed for dental procedures)   apixaban  (ELIQUIS ) 2.5 MG TABS tablet Take 1 tablet (2.5 mg total) by mouth 2 (two) times daily.   B-D 3CC LUER-LOK SYR 25GX1 25G X 1 3 ML MISC Inject 1 mL into the muscle once a week.   BELSOMRA 20 MG TABS Take 20 mg by mouth at bedtime as needed (sleep).   Calcium  Citrate-Vitamin D  (CITRACAL + D PO) Take 1 tablet by mouth every evening.   carbidopa -levodopa  (SINEMET  IR) 25-100 MG tablet Take 2 tablets by mouth 3 (three) times daily.   Carbidopa -Levodopa  ER (RYTARY ) 36.25-145 MG CPCR Take 2 tablets by mouth at bedtime.   cholecalciferol (VITAMIN D3) 25 MCG (1000 UNIT) tablet Take 1,000 Units by mouth in the morning.   cyanocobalamin  (,VITAMIN B-12,) 1000 MCG/ML injection INJECT 1ML INTRAMUSCULARLY IN THE DELTOID ONCE A MONTH (ALTERNATING DELT. EACH MONTH)   Cyanocobalamin  (VITAMIN B12) 1000 MCG TBCR Take 1,000 mcg by mouth in the morning.   docusate sodium  (COLACE) 100 MG capsule Take 100 mg by mouth 2 (two) times daily.   GLUTATHIONE PO Take 2 capsules by mouth in the morning.   levothyroxine  (SYNTHROID ) 50 MCG tablet Take 50-100 mcg by mouth See admin instructions. Take 2 tablets (100 mcg) by mouth on Mondays and Thursdays in the morning. Take 1 tablet (50 mcg) by mouth on Sundays, Tuesdays, Wednesdays, Fridays & Saturdays.   magnesium oxide  (MAG-OX) 400 MG  tablet Take 400 mg by mouth at bedtime.   metoprolol  succinate (TOPROL -XL) 25 MG 24 hr tablet TAKE ONE TABLET AT BEDTIME   NON FORMULARY Take 1 tablet by mouth at bedtime. MetaCalm Stress Regulation Formula   pantoprazole (PROTONIX) 40 MG tablet Take 40 mg by mouth 2 (two) times daily.   polyethylene glycol (MIRALAX ) 17 g packet Take by mouth. (Patient taking differently: Take 17 g by mouth as needed.)   rasagiline  (AZILECT ) 1 MG TABS tablet Take 1 tablet (1 mg total) by mouth daily.   spironolactone  (ALDACTONE ) 25 MG tablet Take 2 tablets (50 mg total) by mouth daily.   torsemide  (DEMADEX ) 20 MG tablet Take 4 tablets (80 mg total) by mouth every morning AND 3 tablets (60 mg total) every evening.   vitamin C (ASCORBIC ACID) 500 MG tablet Take 500 mg by mouth in the morning.   Facility-Administered Encounter Medications as of 10/15/2023  Medication   sodium chloride  flush (NS) 0.9 % injection 10 mL    Objective:   PHYSICAL EXAMINATION:    VITALS:   There were no vitals filed for this visit.    Wt Readings from Last 3 Encounters:  08/21/23 109 lb 12.8 oz (49.8 kg)  07/14/23 113 lb 3.2 oz (51.3 kg)  07/06/23 121 lb 12.8 oz (55.2 kg)   GEN:  The patient appears stated age and is in NAD. HEENT:  Normocephalic, AT.  MMM CV:  RRR Lungs:  CTAB Neck:  no bruits   Neurological examination:  Orientation: The patient is alert and oriented x3. Cranial nerves: There is good facial symmetry with facial hypomimia. The speech is fluent and clear. Soft palate rises symmetrically and there is no tongue deviation. Hearing is intact to conversational tone. Sensation: Sensation is intact to light touch throughout Motor: Strength is at least antigravity x4.  Movement examination: Tone: There is nl tone in the ue/le Abnormal movements: mild to mod dyskinesia in the legs/feet early in the visit.  By the end of the visit, her dyskinesia had become moderate to severe, as she had just  taken her medication right before I walked in. Coordination:  There is no decremation Gait and Station: The patient's stride length is good.  Ambulates with good speed and good arm swing.      Total time spent on today's visit was *** minutes, including both face-to-face time and nonface-to-face time.  Time included that spent on review of records (prior notes available to me/labs/imaging if pertinent), discussing treatment and goals, answering patient's questions and coordinating care.  Cc:  Shayne Anes, MD

## 2023-10-15 ENCOUNTER — Ambulatory Visit: Payer: Self-pay | Admitting: Cardiology

## 2023-10-15 ENCOUNTER — Inpatient Hospital Stay (HOSPITAL_BASED_OUTPATIENT_CLINIC_OR_DEPARTMENT_OTHER): Admitting: Hematology and Oncology

## 2023-10-15 ENCOUNTER — Ambulatory Visit: Payer: Medicare Other | Attending: Cardiology

## 2023-10-15 ENCOUNTER — Encounter: Payer: Self-pay | Admitting: Oncology

## 2023-10-15 ENCOUNTER — Inpatient Hospital Stay: Attending: Hematology and Oncology

## 2023-10-15 ENCOUNTER — Ambulatory Visit (INDEPENDENT_AMBULATORY_CARE_PROVIDER_SITE_OTHER): Admitting: Neurology

## 2023-10-15 VITALS — BP 100/60 | HR 76 | Wt 114.6 lb

## 2023-10-15 DIAGNOSIS — R32 Unspecified urinary incontinence: Secondary | ICD-10-CM | POA: Insufficient documentation

## 2023-10-15 DIAGNOSIS — G20B2 Parkinson's disease with dyskinesia, with fluctuations: Secondary | ICD-10-CM

## 2023-10-15 DIAGNOSIS — Z7901 Long term (current) use of anticoagulants: Secondary | ICD-10-CM | POA: Diagnosis not present

## 2023-10-15 DIAGNOSIS — I5032 Chronic diastolic (congestive) heart failure: Secondary | ICD-10-CM | POA: Diagnosis not present

## 2023-10-15 DIAGNOSIS — D649 Anemia, unspecified: Secondary | ICD-10-CM | POA: Insufficient documentation

## 2023-10-15 DIAGNOSIS — K5909 Other constipation: Secondary | ICD-10-CM | POA: Insufficient documentation

## 2023-10-15 DIAGNOSIS — Z79899 Other long term (current) drug therapy: Secondary | ICD-10-CM | POA: Insufficient documentation

## 2023-10-15 DIAGNOSIS — R202 Paresthesia of skin: Secondary | ICD-10-CM

## 2023-10-15 LAB — CUP PACEART REMOTE DEVICE CHECK
Battery Remaining Longevity: 102 mo
Battery Voltage: 3 V
Brady Statistic AP VP Percent: 85.56 %
Brady Statistic AP VS Percent: 3.55 %
Brady Statistic AS VP Percent: 9.21 %
Brady Statistic AS VS Percent: 1.69 %
Brady Statistic RA Percent Paced: 81.31 %
Brady Statistic RV Percent Paced: 94.94 %
Date Time Interrogation Session: 20250911015531
Implantable Lead Connection Status: 753985
Implantable Lead Connection Status: 753985
Implantable Lead Implant Date: 20140917
Implantable Lead Implant Date: 20140917
Implantable Lead Location: 753859
Implantable Lead Location: 753860
Implantable Pulse Generator Implant Date: 20230614
Lead Channel Impedance Value: 304 Ohm
Lead Channel Impedance Value: 342 Ohm
Lead Channel Impedance Value: 361 Ohm
Lead Channel Impedance Value: 361 Ohm
Lead Channel Pacing Threshold Amplitude: 0.75 V
Lead Channel Pacing Threshold Amplitude: 0.75 V
Lead Channel Pacing Threshold Pulse Width: 0.4 ms
Lead Channel Pacing Threshold Pulse Width: 0.4 ms
Lead Channel Sensing Intrinsic Amplitude: 0.125 mV
Lead Channel Sensing Intrinsic Amplitude: 0.125 mV
Lead Channel Sensing Intrinsic Amplitude: 14.375 mV
Lead Channel Sensing Intrinsic Amplitude: 14.375 mV
Lead Channel Setting Pacing Amplitude: 1.5 V
Lead Channel Setting Pacing Amplitude: 2 V
Lead Channel Setting Pacing Pulse Width: 0.4 ms
Lead Channel Setting Sensing Sensitivity: 1.2 mV
Zone Setting Status: 755011
Zone Setting Status: 755011

## 2023-10-15 LAB — CBC WITH DIFFERENTIAL (CANCER CENTER ONLY)
Abs Immature Granulocytes: 0.02 K/uL (ref 0.00–0.07)
Basophils Absolute: 0 K/uL (ref 0.0–0.1)
Basophils Relative: 1 %
Eosinophils Absolute: 0.1 K/uL (ref 0.0–0.5)
Eosinophils Relative: 1 %
HCT: 31.2 % — ABNORMAL LOW (ref 36.0–46.0)
Hemoglobin: 9.7 g/dL — ABNORMAL LOW (ref 12.0–15.0)
Immature Granulocytes: 0 %
Lymphocytes Relative: 9 %
Lymphs Abs: 0.5 K/uL — ABNORMAL LOW (ref 0.7–4.0)
MCH: 27.2 pg (ref 26.0–34.0)
MCHC: 31.1 g/dL (ref 30.0–36.0)
MCV: 87.4 fL (ref 80.0–100.0)
Monocytes Absolute: 0.7 K/uL (ref 0.1–1.0)
Monocytes Relative: 11 %
Neutro Abs: 4.5 K/uL (ref 1.7–7.7)
Neutrophils Relative %: 78 %
Platelet Count: 211 K/uL (ref 150–400)
RBC: 3.57 MIL/uL — ABNORMAL LOW (ref 3.87–5.11)
RDW: 23.3 % — ABNORMAL HIGH (ref 11.5–15.5)
WBC Count: 5.8 K/uL (ref 4.0–10.5)
nRBC: 0 % (ref 0.0–0.2)

## 2023-10-15 LAB — FERRITIN: Ferritin: 113 ng/mL (ref 11–307)

## 2023-10-15 LAB — IRON AND IRON BINDING CAPACITY (CC-WL,HP ONLY)
Iron: 47 ug/dL (ref 28–170)
Saturation Ratios: 12 % (ref 10.4–31.8)
TIBC: 386 ug/dL (ref 250–450)
UIBC: 339 ug/dL (ref 148–442)

## 2023-10-15 MED ORDER — RYTARY 36.25-145 MG PO CPCR: 2.0000 | ORAL_CAPSULE | Freq: Four times a day (QID) | ORAL | 1 refills | Status: AC

## 2023-10-15 NOTE — Assessment & Plan Note (Signed)
 Lab review: 06/11/2021: Hemoglobin 10.3, MCV 88.2 02/21/2022: Hemoglobin 8.3, MCV 101, ferritin 88, creatinine 1.08, iron saturation 13%, TIBC 406 03/06/2022: Hemoglobin 8.7, MCV 96, RDW 15.9 SPEP: No M protein, LDH 232, folate 15.9, DAT negative, EPO 39.2 04/21/2022: Hemoglobin 10.8, MCV 89.3 05/27/2022: Hemoglobin 10.5 12/17/2022: Hemoglobin 9.1, MCV 78.5 (liver biopsy: Cirrhosis) 06/15/2023: Hemoglobin 9.9, MCV 88.5, iron saturation 10%, creatinine 1.19   Cord stem cell infusion November 2023    Anticoagulation Eliquis  dose recently reduced due to age and weight considerations. No reported adverse effects.   Since her blood counts have remained stable we decided not to do any further interventions. Patient is has Parkinson's and is concerned about worsening neuromotor effects. She and her husband both live at [facility].  Return to clinic in 6 months with labs and follow-up

## 2023-10-15 NOTE — Patient Instructions (Addendum)
 STOP carbidopa /levodopa  IR (the yellow pill) START rytary  145 mg, 2 tablets four times per day  We will schedule PT at [facility]  ELECTROMYOGRAM AND NERVE CONDUCTION STUDIES (EMG/NCS) INSTRUCTIONS  How to Prepare The neurologist conducting the EMG will need to know if you have certain medical conditions. Tell the neurologist and other EMG lab personnel if you: Have a pacemaker or any other electrical medical device Take blood-thinning medications Have hemophilia, a blood-clotting disorder that causes prolonged bleeding Bathing Take a shower or bath shortly before your exam in order to remove oils from your skin. Don't apply lotions or creams before the exam.  What to Expect You'll likely be asked to change into a hospital gown for the procedure and lie down on an examination table. The following explanations can help you understand what will happen during the exam.  Electrodes. The neurologist or a technician places surface electrodes at various locations on your skin depending on where you're experiencing symptoms. Or the neurologist may insert needle electrodes at different sites depending on your symptoms.  Sensations. The electrodes will at times transmit a tiny electrical current that you may feel as a twinge or spasm. The needle electrode may cause discomfort or pain that usually ends shortly after the needle is removed. If you are concerned about discomfort or pain, you may want to talk to the neurologist about taking a short break during the exam.  Instructions. During the needle EMG, the neurologist will assess whether there is any spontaneous electrical activity when the muscle is at rest - activity that isn't present in healthy muscle tissue - and the degree of activity when you slightly contract the muscle.  He or she will give you instructions on resting and contracting a muscle at appropriate times. Depending on what muscles and nerves the neurologist is examining, he or she may ask  you to change positions during the exam.  After your EMG You may experience some temporary, minor bruising where the needle electrode was inserted into your muscle. This bruising should fade within several days. If it persists, contact your primary care doctor.

## 2023-10-15 NOTE — Progress Notes (Signed)
 Patient Care Team: Shayne Anes, MD as PCP - General (Internal Medicine) Nahser, Aleene PARAS, MD (Inactive) as PCP - Cardiology (Cardiology) Inocencio Soyla Lunger, MD as PCP - Electrophysiology (Cardiology) Lajuana Jodie BROCKS, MD as Referring Physician (Cardiothoracic Surgery) Craig Norleen Mt, MD as Consulting Physician (Internal Medicine) Tat, Asberry RAMAN, DO as Consulting Physician (Neurology)  DIAGNOSIS:  Encounter Diagnosis  Name Primary?   Normocytic anemia Yes     CHIEF COMPLIANT: Follow-up of normocytic anemia  HISTORY OF PRESENT ILLNESS:   History of Present Illness [name] [name] is an 83 year old female with anemia who presents for follow-up of her anemia.  She received an iron infusion on August 19th, which improved her energy levels but did not significantly change her hemoglobin levels. Her hemoglobin is currently 9.7, consistent with her historical levels. No significant blood loss is noted except for hemorrhoids, which are exacerbated by constipation. She takes Colace, Miralax , and Senokot to manage her constipation.     ALLERGIES:  is allergic to amantadine , amiodarone , and statins.  MEDICATIONS:  Current Outpatient Medications  Medication Sig Dispense Refill   BELSOMRA 20 MG TABS Take 20 mg by mouth at bedtime as needed (sleep).     polyethylene glycol (MIRALAX ) 17 g packet Take by mouth.     Acetylcysteine (NAC 600) 600 MG CAPS Take 600 mg by mouth in the morning.     AMBULATORY NON FORMULARY MEDICATION Rollator Dx: G20.B2 1 Device 0   apixaban  (ELIQUIS ) 2.5 MG TABS tablet Take 1 tablet (2.5 mg total) by mouth 2 (two) times daily. 60 tablet 11   B-D 3CC LUER-LOK SYR 25GX1 25G X 1 3 ML MISC Inject 1 mL into the muscle once a week.     Calcium  Citrate-Vitamin D  (CITRACAL + D PO) Take 1 tablet by mouth every evening.     Carbidopa -Levodopa  ER (RYTARY ) 36.25-145 MG CPCR Take 2 tablets by mouth 4 (four) times daily. 720 capsule 1   cholecalciferol  (VITAMIN D3) 25 MCG (1000 UNIT) tablet Take 1,000 Units by mouth in the morning.     cyanocobalamin  (,VITAMIN B-12,) 1000 MCG/ML injection INJECT 1ML INTRAMUSCULARLY IN THE DELTOID ONCE A MONTH (ALTERNATING DELT. EACH MONTH)     Cyanocobalamin  (VITAMIN B12) 1000 MCG TBCR Take 1,000 mcg by mouth in the morning.     docusate sodium  (COLACE) 100 MG capsule Take 100 mg by mouth 2 (two) times daily.     GLUTATHIONE PO Take 2 capsules by mouth in the morning.     levothyroxine  (SYNTHROID ) 50 MCG tablet Take 50-100 mcg by mouth See admin instructions. Take 2 tablets (100 mcg) by mouth on Mondays and Thursdays in the morning. Take 1 tablet (50 mcg) by mouth on Sundays, Tuesdays, Wednesdays, Fridays & Saturdays.     magnesium oxide (MAG-OX) 400 MG tablet Take 400 mg by mouth at bedtime.     metoprolol  succinate (TOPROL -XL) 25 MG 24 hr tablet TAKE ONE TABLET AT BEDTIME 30 tablet 6   NON FORMULARY Take 1 tablet by mouth at bedtime. MetaCalm Stress Regulation Formula     pantoprazole (PROTONIX) 40 MG tablet Take 40 mg by mouth 2 (two) times daily.     rasagiline  (AZILECT ) 1 MG TABS tablet Take 1 tablet (1 mg total) by mouth daily. 90 tablet 1   spironolactone  (ALDACTONE ) 25 MG tablet Take 2 tablets (50 mg total) by mouth daily. 180 tablet 3   torsemide  (DEMADEX ) 20 MG tablet Take 4 tablets (80 mg total) by  mouth every morning AND 3 tablets (60 mg total) every evening. 300 tablet 3   vitamin C (ASCORBIC ACID) 500 MG tablet Take 500 mg by mouth in the morning.     No current facility-administered medications for this visit.   Facility-Administered Medications Ordered in Other Visits  Medication Dose Route Frequency Provider Last Rate Last Admin   sodium chloride  flush (NS) 0.9 % injection 10 mL  10 mL Intravenous Q12H Christine Males, PA-C        PHYSICAL EXAMINATION: ECOG PERFORMANCE STATUS: 1 - Symptomatic but completely ambulatory Physical Exam SKIN: Multiple small lipomas, non-tender, mobile.  (exam  performed in the presence of a chaperone)  LABORATORY DATA:  I have reviewed the data as listed    Latest Ref Rng & Units 09/08/2023    4:36 PM 08/21/2023    3:17 PM 06/15/2023    2:56 PM  CMP  Glucose 70 - 99 mg/dL 867  848  844   BUN 8 - 27 mg/dL 33  29  24   Creatinine 0.57 - 1.00 mg/dL 8.52  8.31  8.80   Sodium 134 - 144 mmol/L 131  133  136   Potassium 3.5 - 5.2 mmol/L 3.7  5.5  4.1   Chloride 96 - 106 mmol/L 90  97  98   CO2 20 - 29 mmol/L 24  26  29    Calcium  8.7 - 10.3 mg/dL 9.0  9.5  9.1   Total Protein 6.5 - 8.1 g/dL   7.1   Total Bilirubin 0.0 - 1.2 mg/dL   1.0   Alkaline Phos 38 - 126 U/L   87   AST 15 - 41 U/L   16   ALT 0 - 44 U/L   <5     Lab Results  Component Value Date   WBC 5.8 10/15/2023   HGB 9.7 (L) 10/15/2023   HCT 31.2 (L) 10/15/2023   MCV 87.4 10/15/2023   PLT 211 10/15/2023   NEUTROABS 4.5 10/15/2023    ASSESSMENT & PLAN:  Normocytic anemia Lab review: 06/11/2021: Hemoglobin 10.3, MCV 88.2 02/21/2022: Hemoglobin 8.3, MCV 101, ferritin 88, creatinine 1.08, iron saturation 13%, TIBC 406 03/06/2022: Hemoglobin 8.7, MCV 96, RDW 15.9 SPEP: No M protein, LDH 232, folate 15.9, DAT negative, EPO 39.2 04/21/2022: Hemoglobin 10.8, MCV 89.3 05/27/2022: Hemoglobin 10.5 12/17/2022: Hemoglobin 9.1, MCV 78.5 (liver biopsy: Cirrhosis) 06/15/2023: Hemoglobin 9.9, MCV 88.5, iron saturation 10%, creatinine 1.19   Cord stem cell infusion November 2023    Anticoagulation Eliquis  dose recently reduced due to age and weight considerations. No reported adverse effects.   Since her blood counts have remained stable we decided not to do any further interventions. Patient is has Parkinson's and is concerned about worsening neuromotor effects. She and her husband both live at [facility].  Return to clinic in 6 months with labs and follow-up ------------------------------------- Assessment and Plan Assessment & Plan Normocytic anemia Chronic normocytic anemia with  hemoglobin at 9.7 g/dL, slightly decreased. Recent iron infusion improved energy but not hemoglobin levels. No significant blood loss reported. - Review iron study results once available. - Consider additional iron infusions if iron levels are low. - Monitor hemoglobin levels every 3-4 months. - Consider Retacrit injections if hemoglobin drops below 9 g/dL, ensuring iron levels are adequate.  Constipation Chronic constipation with large, hard stools. Managed with Colace, Miralax , and Senokot. - Continue Colace, Miralax , and Senokot as needed. - Consider Dulcolax suppository as an alternative to Miralax  based on preference and  effectiveness.  Urinary incontinence Intermittent urinary incontinence with bladder pressure sensation. Managed with protective undergarments. - Consider Azo for bladder irritability and urinary symptoms.      Orders Placed This Encounter  Procedures   CBC with Differential (Cancer Center Only)    Standing Status:   Future    Expiration Date:   10/14/2024   Ferritin    Standing Status:   Future    Expiration Date:   10/14/2024   Iron and Iron Binding Capacity ([facility] only)    Standing Status:   Future    Expiration Date:   10/14/2024   CMP (Cancer Center only)    Standing Status:   Future    Expiration Date:   10/14/2024   Sample to Blood Bank    Standing Status:   Future    Expiration Date:   10/14/2024   The patient has a good understanding of the overall plan. she agrees with it. she will call with any problems that may develop before the next visit here. Total time spent: 30 mins including face to face time and time spent for planning, charting and co-ordination of care   Naomi MARLA Chad, MD 10/15/23

## 2023-10-21 DIAGNOSIS — H353211 Exudative age-related macular degeneration, right eye, with active choroidal neovascularization: Secondary | ICD-10-CM | POA: Diagnosis not present

## 2023-10-21 DIAGNOSIS — H4052X3 Glaucoma secondary to other eye disorders, left eye, severe stage: Secondary | ICD-10-CM | POA: Diagnosis not present

## 2023-10-21 DIAGNOSIS — H43812 Vitreous degeneration, left eye: Secondary | ICD-10-CM | POA: Diagnosis not present

## 2023-10-21 DIAGNOSIS — H353221 Exudative age-related macular degeneration, left eye, with active choroidal neovascularization: Secondary | ICD-10-CM | POA: Diagnosis not present

## 2023-10-21 DIAGNOSIS — H35721 Serous detachment of retinal pigment epithelium, right eye: Secondary | ICD-10-CM | POA: Diagnosis not present

## 2023-10-21 DIAGNOSIS — H353132 Nonexudative age-related macular degeneration, bilateral, intermediate dry stage: Secondary | ICD-10-CM | POA: Diagnosis not present

## 2023-10-22 NOTE — Progress Notes (Signed)
 Remote PPM Transmission

## 2023-10-23 DIAGNOSIS — Z23 Encounter for immunization: Secondary | ICD-10-CM | POA: Diagnosis not present

## 2023-10-27 DIAGNOSIS — R2681 Unsteadiness on feet: Secondary | ICD-10-CM | POA: Diagnosis not present

## 2023-10-27 DIAGNOSIS — R262 Difficulty in walking, not elsewhere classified: Secondary | ICD-10-CM | POA: Diagnosis not present

## 2023-10-27 DIAGNOSIS — R2689 Other abnormalities of gait and mobility: Secondary | ICD-10-CM | POA: Diagnosis not present

## 2023-10-29 DIAGNOSIS — T1511XA Foreign body in conjunctival sac, right eye, initial encounter: Secondary | ICD-10-CM | POA: Diagnosis not present

## 2023-10-29 DIAGNOSIS — H0100A Unspecified blepharitis right eye, upper and lower eyelids: Secondary | ICD-10-CM | POA: Diagnosis not present

## 2023-10-29 DIAGNOSIS — H04121 Dry eye syndrome of right lacrimal gland: Secondary | ICD-10-CM | POA: Diagnosis not present

## 2023-11-03 DIAGNOSIS — R262 Difficulty in walking, not elsewhere classified: Secondary | ICD-10-CM | POA: Diagnosis not present

## 2023-11-03 DIAGNOSIS — R2681 Unsteadiness on feet: Secondary | ICD-10-CM | POA: Diagnosis not present

## 2023-11-03 DIAGNOSIS — R2689 Other abnormalities of gait and mobility: Secondary | ICD-10-CM | POA: Diagnosis not present

## 2023-11-05 ENCOUNTER — Other Ambulatory Visit: Payer: Self-pay | Admitting: Nurse Practitioner

## 2023-11-05 DIAGNOSIS — K7469 Other cirrhosis of liver: Secondary | ICD-10-CM | POA: Diagnosis not present

## 2023-11-05 DIAGNOSIS — D376 Neoplasm of uncertain behavior of liver, gallbladder and bile ducts: Secondary | ICD-10-CM | POA: Diagnosis not present

## 2023-11-07 ENCOUNTER — Other Ambulatory Visit: Payer: Self-pay | Admitting: Cardiology

## 2023-11-09 DIAGNOSIS — R262 Difficulty in walking, not elsewhere classified: Secondary | ICD-10-CM | POA: Diagnosis not present

## 2023-11-09 DIAGNOSIS — R2689 Other abnormalities of gait and mobility: Secondary | ICD-10-CM | POA: Diagnosis not present

## 2023-11-09 DIAGNOSIS — R2681 Unsteadiness on feet: Secondary | ICD-10-CM | POA: Diagnosis not present

## 2023-11-11 DIAGNOSIS — R2681 Unsteadiness on feet: Secondary | ICD-10-CM | POA: Diagnosis not present

## 2023-11-11 DIAGNOSIS — R2689 Other abnormalities of gait and mobility: Secondary | ICD-10-CM | POA: Diagnosis not present

## 2023-11-11 DIAGNOSIS — R262 Difficulty in walking, not elsewhere classified: Secondary | ICD-10-CM | POA: Diagnosis not present

## 2023-11-12 ENCOUNTER — Ambulatory Visit
Admission: RE | Admit: 2023-11-12 | Discharge: 2023-11-12 | Disposition: A | Discharge status: Home / Self Care | Source: Ambulatory Visit | Attending: Nurse Practitioner | Admitting: Nurse Practitioner

## 2023-11-12 DIAGNOSIS — K7460 Unspecified cirrhosis of liver: Secondary | ICD-10-CM | POA: Diagnosis not present

## 2023-11-12 DIAGNOSIS — K7469 Other cirrhosis of liver: Secondary | ICD-10-CM

## 2023-11-12 DIAGNOSIS — D376 Neoplasm of uncertain behavior of liver, gallbladder and bile ducts: Secondary | ICD-10-CM

## 2023-11-12 MED ORDER — IOPAMIDOL (ISOVUE-300) INJECTION 61%
75.0000 mL | Freq: Once | INTRAVENOUS | Status: AC | PRN
Start: 2023-11-12 — End: 2023-11-12
  Administered 2023-11-12: 75 mL via INTRAVENOUS

## 2023-11-16 DIAGNOSIS — R2681 Unsteadiness on feet: Secondary | ICD-10-CM | POA: Diagnosis not present

## 2023-11-16 DIAGNOSIS — R262 Difficulty in walking, not elsewhere classified: Secondary | ICD-10-CM | POA: Diagnosis not present

## 2023-11-16 DIAGNOSIS — R2689 Other abnormalities of gait and mobility: Secondary | ICD-10-CM | POA: Diagnosis not present

## 2023-11-16 NOTE — Progress Notes (Incomplete)
 PCP: Shayne Anes, MD EP: Dr. Inocencio HF Cardiology: Dr. Rolan  Chief complaint: CHF  83 y.o. 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  Patient had an echo done in 3/23 showing EF 65-70%, mild LVH, normal RV, PASP 60 mmHg, moderate biatrial enlargement, mild MR, moderate TR. Prior echoes had shown mildly elevated PA pressure. Cardiolite in 10/21 showed no ischemia.   RHC was done in 5/23 showing mild pulmonary venous hypertension with mildly elevated PCWP. She was started on Farxiga .  She developed a persistent UTI that was difficult to clear and stopped Farxiga .   Acute follow up 02/20/22 for dyspnea. CTA chest showed no PE, moderate right pleural effusion, mild pulmonary edema. NYHA III symptoms and volume overloaded, she was given IV lasix  80 mg x 1 then instructed to use [name]  daily x 2 days, then switch from Lasix  to torsemide  60 mg daily. RHC arranged which showed elevated R/L heart filling pressures, prominent V waves in PCWP tracing, preserved CO and moderate pulmonary venous hypertension. Echo arranged to assess MR and torsemide  increased to 60/40.  S/p R therapeutic thoracentesis 02/25/22 yielding 350 ml fluid.  Echo 2/24 showed EF 55-60%, normal RV, mild MR, moderate-severe TR with dilated IVC.  TEE in 3/24 showed EF 55-60%, mild RV enlargement with mildly decreased RV systolic function, mild MR, severe TR (PPM lead not fused to leaflets), small PFO.   Follow up with EP 06/23/22, device interrogation showing 8% burden of AT/AF. Planning to re-evaluate after possible TR intervention.  Saw Dr. Consepcion @ [facility] in 5/24 and had workup for percutaneous tricuspid interventions (Triclip, Evoque valve).  She was determined not to be a candidate for any of the available tricuspid interventions.    Echo in  3/25 showed EF 55-60%, mild RV dilation with mildly decreased RV systolic function, moderate-severe TR (on my read).   She had a prolonged episode of chest pain, cardiac PET in 7/25 showed small reversible perfusion defect at the apex.  This finding was low risk, but there was also decreased myocardial blood flow reserve most likely from microvascular disease given no coronary calcium  seen on CT.   She saw Dr. Inocencio; he did not think she was a candidate for repeat AF ablation.   Today she returns for HF follow up. Overall feeling fine. She has SOB walking up inclines. Parkinsons symptoms have been worsening recently. She is unsteady but no recent falls. Had CP a month ago, occurred at rest, lasted 10 minutes then resolved. Denies palpitations, abnormal bleeding,  edema, or PND/Orthopnea. Appetite ok. Weight at home 105 pounds. Taking all medications. She is trying to get back to walking 30 minutes a day, currently walking 20 minutes most days of the week. Working with PT 2 days/week. She and her husband moved to main building at [employer].  ReDs reading: 28 %, normal  Medtronic device interrogation (personally reviewed): 8.4 AF burden, 95.2% VP, 7.1 hr/day activity  ECG (personally reviewed): none ordered today  Labs (5/25): hgb 9.9, K 4.1, creatinine 1.19 Labs (8/25): K 3.7, creatinine 1.47  PMH: 1. Atrial fibrillation: Paroxysmal.  S/p AF (epicardial and endocardial) ablation in 10/13.  2. Tachy-brady syndrome: Medtronic PPM placed in 9/14.  3. H/o atypical atrial flutter 4. Hyperlipidemia 5. HTN 6. Hypothyroidism 7. Parkinsons disease: Followed by Dr. Evonnie 8. Cirrhosis: ?NAFLD. No ETOH.  9. Chronic diastolic CHF/pulmonary  hypertension:  - LHC (2014): No significant CAD.  - Cardiolite (10/21): EF 71%, no ischemia.  - Echo (3/23): EF 65-70%, mild LVH, normal RV, PASP 60 mmHg, moderate biatrial enlargement, mild MR, moderate TR.  - RHC (5/23): mean RA 8, PA 49/13 mean 30, mean PCWP 19  with v-waves to 30, CI 3.96, PVR 1.8 WU.  - RHC (2/24): RA mean 12, PA 53/24 (mean 39) PCWP mean 23 with prominent v-waves to 35, CO/CI (Fick) 6.75/4.23, PVR 2.4 WU - Echo (2/24): EF 55-60%, normal RV, mild MR, moderate-severe TR with dilated IVC. - TEE (3/24): EF 55-60%, mild RV enlargement with mildly decreased RV systolic function, mild MR, severe TR (PPM lead not fused to leaflets), small PFO.  - Echo (3/25): EF 55-60%, mild RV dilation with mildly decreased RV systolic function, moderate-severe TR (on my read).  10. B12 deficiency.  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  - Seen at [facility] in [place], not candidate for percutaneous tricuspid repair.  12. Cardiac PET (7/25): small reversible perfusion defect at the apex.  This finding was low risk, but there was also decreased myocardial blood flow reserve most likely from microvascular disease given no coronary calcium  seen on CT.   Social History   Socioeconomic History   Marital status: Married    Spouse name: Not on file   Number of children: 2   Years of education: Not on file   Highest education level: Master's degree (e.g., MA, MS, MEng, MEd, MSW, MBA)  Occupational History   Not on file  Tobacco Use   Smoking status: Never   Smokeless tobacco: Never  Vaping Use   Vaping status: Never Used  Substance and Sexual Activity   Alcohol use: No   Drug use: No   Sexual activity: Yes    Partners: Male    Birth control/protection: Post-menopausal  Other Topics Concern   Not on file  Social History Narrative   Lives in [place].  Retired Comptroller.   Right handed    Social Drivers of Health   Financial Resource Strain: Not on file  Food Insecurity: Low Risk  (04/15/2023)   Received from [facility]   Hunger Vital Sign    Within the past 12 months, you worried that your food would run  out before you got money to buy more: Never true    Within the past 12 months, the food you bought just didn't last and you didn't have money to get more. : Never true  Transportation Needs: No Transportation Needs (04/15/2023)   Received from [facility]    In the past 12 months, has lack of reliable transportation kept you from medical appointments, meetings, work or from getting things needed for daily living? : No  Physical Activity: Not on file  Stress: Not on file  Social Connections: Not on file  Intimate Partner Violence: Not At Risk (07/22/2023)   Received from [facility]   HITS    Over the last 12 months how often did your partner physically hurt you?: Never    Over the last 12 months how often did your partner insult you or talk down to you?: Never    Over the last 12 months how often did your partner threaten you with physical harm?: Never    Over the last 12 months how often  did your partner scream or curse at you?: Never   Family History  Problem Relation Age of Onset   Alzheimer's disease Mother    Heart failure Father    Healthy Child    Healthy Child    Breast cancer Maternal Aunt    ROS: All systems reviewed and negative except as per HPI.   Current Outpatient Medications  Medication Sig Dispense Refill   Acetylcysteine (NAC 600) 600 MG CAPS Take 600 mg by mouth in the morning.     AMBULATORY NON FORMULARY MEDICATION Rollator Dx: G20.B2 1 Device 0   apixaban  (ELIQUIS ) 2.5 MG TABS tablet Take 1 tablet (2.5 mg total) by mouth 2 (two) times daily. 60 tablet 11   B-D 3CC LUER-LOK SYR 25GX1 25G X 1 3 ML MISC Inject 1 mL into the muscle once a week.     BELSOMRA 20 MG TABS Take 20 mg by mouth at bedtime as needed (sleep).     Calcium  Citrate-Vitamin D  (CITRACAL + D PO) Take 1 tablet by mouth every evening.     Carbidopa -Levodopa  ER (RYTARY ) 36.25-145 MG CPCR Take 2 tablets by mouth 4 (four) times daily. 720 capsule 1   cholecalciferol (VITAMIN  D3) 25 MCG (1000 UNIT) tablet Take 1,000 Units by mouth in the morning.     cyanocobalamin  (,VITAMIN B-12,) 1000 MCG/ML injection INJECT 1ML INTRAMUSCULARLY IN THE DELTOID ONCE A MONTH (ALTERNATING DELT. EACH MONTH)     Cyanocobalamin  (VITAMIN B12) 1000 MCG TBCR Take 1,000 mcg by mouth in the morning.     docusate sodium  (COLACE) 100 MG capsule Take 100 mg by mouth 2 (two) times daily.     GLUTATHIONE PO Take 2 capsules by mouth in the morning.     levothyroxine  (SYNTHROID ) 50 MCG tablet Take 50-100 mcg by mouth See admin instructions. Take 2 tablets (100 mcg) by mouth on Mondays and Thursdays in the morning. Take 1 tablet (50 mcg) by mouth on Sundays, Tuesdays, Wednesdays, Fridays & Saturdays.     magnesium oxide (MAG-OX) 400 MG tablet Take 400 mg by mouth at bedtime.     metoprolol  succinate (TOPROL -XL) 25 MG 24 hr tablet TAKE ONE TABLET BY MOUTH AT BEDTIME 30 tablet 6   NON FORMULARY Take 1 tablet by mouth at bedtime. MetaCalm Stress Regulation Formula     pantoprazole (PROTONIX) 40 MG tablet Take 40 mg by mouth 2 (two) times daily. (Patient not taking: Reported on 11/18/2023)     polyethylene glycol (MIRALAX ) 17 g packet Take by mouth.     rasagiline  (AZILECT ) 1 MG TABS tablet Take 1 tablet (1 mg total) by mouth daily. 90 tablet 1   spironolactone  (ALDACTONE ) 25 MG tablet Take 2 tablets (50 mg total) by mouth daily. 180 tablet 3   torsemide  (DEMADEX ) 20 MG tablet Take 4 tablets (80 mg total) by mouth every morning AND 3 tablets (60 mg total) every evening. 300 tablet 3   vitamin C (ASCORBIC ACID) 500 MG tablet Take 500 mg by mouth in the morning.     No current facility-administered medications for this encounter.   Facility-Administered Medications Ordered in Other Encounters  Medication Dose Route Frequency Provider Last Rate Last Admin   sodium chloride  flush (NS) 0.9 % injection 10 mL  10 mL Intravenous Q12H Malyah, Ohlrich, PA-C       BP 114/70   Pulse 68   Wt 48.2 kg (106 lb 3.2 oz)    LMP 02/04/1996   SpO2 96%   BMI 20.07  kg/m   Wt Readings from Last 3 Encounters:  11/18/23 48.2 kg (106 lb 3.2 oz)  10/15/23 52 kg (114 lb 9.6 oz)  08/21/23 49.8 kg (109 lb 12.8 oz)   Physical Exam General:  NAD. No resp difficulty, walked into clinic HEENT: Normal Neck: Supple. No JVD. Cor: Regular rate & rhythm. No rubs, gallops , 2/6 HSM LLSB Lungs: Clear Abdomen: Soft, nontender, nondistended.  Extremities: No cyanosis, clubbing, rash, edema Neuro: Alert & oriented x 3, moves all 4 extremities w/o difficulty. Affect pleasant. + BLE dyskinesia   Assessment/Plan: 1. Pulmonary hypertension:  RHC (1/24) showed moderate pulmonary venous hypertension with elevated PCWP (group 2 PH). 2. Chronic diastolic CHF: Echo in 3/23 with EF 65-70%, mild LVH, normal RV, PASP 60 mmHg, moderate biatrial enlargement, mild MR, moderate TR. RHC in 5/23 showed elevated PCWP with prominent v-waves.  Echo from 3/23 reviewed, no more than mild MR so suspect elevated v-waves due to diastolic dysfunction.  She had pulmonary venous hypertension. She underwent RHC again in 1/24 showing elevated R/L heart filling pressures, prominent V-waves in PCWP tracing, preserved CO and moderate pulmonary venous hypertension. Echo 2/24 showed stable EF 55-60%, normal RV, mild MR, moderate-severe TR with dilated IVC. TEE in 3/24 showed EF 55-60%, mild RV enlargement with mildly decreased RV systolic function, mild MR, severe TR (PPM lead not fused to leaflets), small PFO. Echo in 3/25 showed EF 55-60%, mild RV dilation with mildly decreased RV systolic function, moderate-severe TR (on  Dr. Orvilla read). NYHA II-III, symptoms confounded by worsening Parkinsons symptoms. She is not volume overloaded on exam. ReDs 27% - Continue torsemide  80 qam/60 qpm.  BMET today. - Continue spironolactone  50 mg daily.   - Off Farxiga  due to persistent UTI.  3. Tricuspid regurgitation: Echo 2/24 showed moderate to severe TR. TEE was done in  3/24, showing severe TR with ERO 0.43 cm^2 by PISA, regurgitant volume 50 cc, PPM does moves independently from leaflets and does not appear to be fused, there is mild RV dilation/mild RV dysfunction.  Suspecct  TR plays a significant role in her CHF.  She had evaluation at [facility] and was determined not to be a suitable candidate for tricuspid valve percutaneous repair (Triclip or Evoque) even if we were to remove her pacemaker lead and use a leadless pacemaker instead. Continue medical management.  4. Atrial fibrillation: Paroxysmal. She has had an epicardial and endocardial ablation in the past.  She failed Tikosyn  in the past and did not tolerate amiodarone  due to side effects. Per EP, not candidate for redo ablation. About 8% atrial fibrillation on today's device interrogation.  - Continue Eliquis  2.5 mg bid. No bleeding issues. - Continue Toprol  XL.  5. Tachy-brady syndrome: Has MDT PPM.  6. Cirrhosis: ? NAFLD.  Does not drink any ETOH now, was not a heavy drinker before.  - Followed by [facility].  7. Anemia, chronic. She has Fe deficiency.  - She follows with Heme/Onc and receives iron infusions. 8. Chest Pain: Cardiac PET in 7/25 showed small reversible perfusion defect at the apex.  This finding was low risk, but there was also decreased myocardial blood flow reserve most likely from microvascular disease given no coronary calcium  seen on CT. 1 episode of atypical, non-exertional CP last month. None since. - If she develops further atypical chest pain, would be reasonable to do a coronary CTA.   Follow up in 4 months with Dr. Rolan Raisin Navos FNP-BC 11/18/2023

## 2023-11-17 ENCOUNTER — Telehealth (HOSPITAL_COMMUNITY): Payer: Self-pay

## 2023-11-17 DIAGNOSIS — D376 Neoplasm of uncertain behavior of liver, gallbladder and bile ducts: Secondary | ICD-10-CM | POA: Diagnosis not present

## 2023-11-17 NOTE — Telephone Encounter (Signed)
 Called to confirm/remind patient of their appointment [facility] on 11/18/23.   Appointment:   [x] Confirmed  [] Left mess   [] No answer/No voice mail  [] VM Full/unable to leave message  [] Phone not in service  Patient reminded to bring all medications and/or complete list.  Confirmed patient has transportation. Gave directions, instructed to utilize valet parking.

## 2023-11-18 ENCOUNTER — Encounter (HOSPITAL_COMMUNITY): Payer: Self-pay

## 2023-11-18 ENCOUNTER — Ambulatory Visit (HOSPITAL_COMMUNITY)
Admission: RE | Admit: 2023-11-18 | Discharge: 2023-11-18 | Disposition: A | Discharge status: Home / Self Care | Source: Ambulatory Visit | Attending: Family Medicine | Admitting: Family Medicine

## 2023-11-18 ENCOUNTER — Ambulatory Visit (HOSPITAL_COMMUNITY): Payer: Self-pay | Admitting: Family Medicine

## 2023-11-18 VITALS — BP 114/70 | HR 68 | Wt 106.2 lb

## 2023-11-18 DIAGNOSIS — Z950 Presence of cardiac pacemaker: Secondary | ICD-10-CM | POA: Diagnosis not present

## 2023-11-18 DIAGNOSIS — Z7901 Long term (current) use of anticoagulants: Secondary | ICD-10-CM | POA: Insufficient documentation

## 2023-11-18 DIAGNOSIS — I071 Rheumatic tricuspid insufficiency: Secondary | ICD-10-CM

## 2023-11-18 DIAGNOSIS — I4811 Longstanding persistent atrial fibrillation: Secondary | ICD-10-CM

## 2023-11-18 DIAGNOSIS — I110 Hypertensive heart disease with heart failure: Secondary | ICD-10-CM | POA: Diagnosis not present

## 2023-11-18 DIAGNOSIS — R079 Chest pain, unspecified: Secondary | ICD-10-CM | POA: Diagnosis not present

## 2023-11-18 DIAGNOSIS — Z7984 Long term (current) use of oral hypoglycemic drugs: Secondary | ICD-10-CM | POA: Insufficient documentation

## 2023-11-18 DIAGNOSIS — I5032 Chronic diastolic (congestive) heart failure: Secondary | ICD-10-CM | POA: Diagnosis not present

## 2023-11-18 DIAGNOSIS — I495 Sick sinus syndrome: Secondary | ICD-10-CM | POA: Diagnosis present

## 2023-11-18 DIAGNOSIS — R2681 Unsteadiness on feet: Secondary | ICD-10-CM | POA: Diagnosis not present

## 2023-11-18 DIAGNOSIS — I272 Pulmonary hypertension, unspecified: Secondary | ICD-10-CM | POA: Diagnosis not present

## 2023-11-18 DIAGNOSIS — K7460 Unspecified cirrhosis of liver: Secondary | ICD-10-CM | POA: Insufficient documentation

## 2023-11-18 DIAGNOSIS — Z79899 Other long term (current) drug therapy: Secondary | ICD-10-CM | POA: Insufficient documentation

## 2023-11-18 DIAGNOSIS — I480 Paroxysmal atrial fibrillation: Secondary | ICD-10-CM | POA: Insufficient documentation

## 2023-11-18 DIAGNOSIS — D509 Iron deficiency anemia, unspecified: Secondary | ICD-10-CM | POA: Insufficient documentation

## 2023-11-18 DIAGNOSIS — G20A1 Parkinson's disease without dyskinesia, without mention of fluctuations: Secondary | ICD-10-CM | POA: Diagnosis not present

## 2023-11-18 DIAGNOSIS — I361 Nonrheumatic tricuspid (valve) insufficiency: Secondary | ICD-10-CM | POA: Insufficient documentation

## 2023-11-18 DIAGNOSIS — D649 Anemia, unspecified: Secondary | ICD-10-CM | POA: Diagnosis not present

## 2023-11-18 DIAGNOSIS — R2689 Other abnormalities of gait and mobility: Secondary | ICD-10-CM | POA: Diagnosis not present

## 2023-11-18 DIAGNOSIS — R262 Difficulty in walking, not elsewhere classified: Secondary | ICD-10-CM | POA: Diagnosis not present

## 2023-11-18 LAB — BASIC METABOLIC PANEL WITH GFR
Anion gap: 10 (ref 5–15)
BUN: 24 mg/dL — ABNORMAL HIGH (ref 8–23)
CO2: 29 mmol/L (ref 22–32)
Calcium: 8.7 mg/dL — ABNORMAL LOW (ref 8.9–10.3)
Chloride: 95 mmol/L — ABNORMAL LOW (ref 98–111)
Creatinine, Ser: 1.05 mg/dL — ABNORMAL HIGH (ref 0.44–1.00)
GFR, Estimated: 53 mL/min — ABNORMAL LOW (ref >60)
Glucose, Bld: 133 mg/dL — ABNORMAL HIGH (ref 70–99)
Potassium: 3.2 mmol/L — ABNORMAL LOW (ref 3.5–5.1)
Sodium: 134 mmol/L — ABNORMAL LOW (ref 135–145)

## 2023-11-18 NOTE — Progress Notes (Signed)
 ReDS Vest / Clip - 11/18/23 1400       ReDS Vest / Clip   Station Marker A    Ruler Value 27    ReDS Value Range Low volume    ReDS Actual Value 28

## 2023-11-18 NOTE — Patient Instructions (Signed)
 Medication Changes:  NO CHANGES  Lab Work:  Labs done today, your results will be available in MyChart, we will contact you for abnormal readings.  Follow-Up in: 4 MONTHS ( February 2026) ** PLEASE CALL THE OFFICE IN DECEMBER TO ARRANGE YOUR FOLLOW UP APPOINTMENT.**  At the [facility], you and your health needs are our priority. We have a designated team specialized in the treatment of Heart Failure. This Care Team includes your primary Heart Failure Specialized Cardiologist (physician), Advanced Practice Providers (APPs- Physician Assistants and Nurse Practitioners), and Pharmacist who all work together to provide you with the care you need, when you need it.   You may see any of the following providers on your designated Care Team at your next follow up:  Dr. Toribio Fuel Dr. Ezra Shuck Dr. Ria Commander Dr. Odis Brownie Greig Mosses, NP Caffie Shed, [place] [facility], [place] Beckey Coe, NP [place] Lee, NP Tinnie Redman, PharmD   Please be sure to bring in all your medications bottles to every appointment.   Need to Contact Us :  If you have any questions or concerns before your next appointment please send us  a message through [place] or call our office at [phone].    TO LEAVE A MESSAGE FOR THE NURSE SELECT OPTION 2, PLEASE LEAVE A MESSAGE INCLUDING: YOUR NAME DATE OF BIRTH CALL BACK NUMBER REASON FOR CALL**this is important as we prioritize the call backs  YOU WILL RECEIVE A CALL BACK THE SAME DAY AS LONG AS YOU CALL BEFORE 4:00 PM

## 2023-11-19 NOTE — Telephone Encounter (Signed)
 Pt reports PCP put her on 20meq daily a few weeks ago   Advised additional changes are needed since K level still low

## 2023-11-23 NOTE — Addendum Note (Signed)
 Encounter addended by: Dante Jeannine HERO, CMA on: 11/23/2023 1:40 PM  Actions taken: Charge Capture section accepted

## 2023-11-24 MED ORDER — POTASSIUM CHLORIDE CRYS ER 10 MEQ PO TBCR
40.0000 meq | EXTENDED_RELEASE_TABLET | Freq: Every day | ORAL | 0 refills | Status: AC
Start: 2023-11-24 — End: 2024-02-22

## 2023-11-30 DIAGNOSIS — R262 Difficulty in walking, not elsewhere classified: Secondary | ICD-10-CM | POA: Diagnosis not present

## 2023-11-30 DIAGNOSIS — R2681 Unsteadiness on feet: Secondary | ICD-10-CM | POA: Diagnosis not present

## 2023-11-30 DIAGNOSIS — R2689 Other abnormalities of gait and mobility: Secondary | ICD-10-CM | POA: Diagnosis not present

## 2023-12-02 DIAGNOSIS — R262 Difficulty in walking, not elsewhere classified: Secondary | ICD-10-CM | POA: Diagnosis not present

## 2023-12-02 DIAGNOSIS — R2681 Unsteadiness on feet: Secondary | ICD-10-CM | POA: Diagnosis not present

## 2023-12-02 DIAGNOSIS — R2689 Other abnormalities of gait and mobility: Secondary | ICD-10-CM | POA: Diagnosis not present

## 2023-12-03 ENCOUNTER — Other Ambulatory Visit (HOSPITAL_COMMUNITY): Payer: Self-pay | Admitting: Nurse Practitioner

## 2023-12-03 ENCOUNTER — Encounter: Payer: Self-pay | Admitting: Cardiology

## 2023-12-03 DIAGNOSIS — D376 Neoplasm of uncertain behavior of liver, gallbladder and bile ducts: Secondary | ICD-10-CM

## 2023-12-04 NOTE — Progress Notes (Unsigned)
 Hughes Simmonds, MD  Daralene Ferol FALCON, RT PROCEDURE / BIOPSY REVIEW Date: 12/04/23  Requested Biopsy site: Liver Reason for request: Mass(es) Imaging review: I reviewed all pertinent diagnostic studies, including; CT AP W WO  Decision: Declined / Defer  Additional comments: @Schedulers . MRI liver mass recommendation. Re-submit for Bx review when completed.  Please contact me with questions, concerns, or if issue pertaining to this request arise.  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)]    History :CT ABDOMEN W WO CONTRAST (Accession [id number]) (Order [id number]), CT ABDOMEN W WO CONTRAST (Accession [id number]) (Order [id number]), DG Abd 2 Views (Accession [id number]) (Order [id number]), NM PET Image Initial (PI) Skull Base To Thigh (F-18 FDG) (Accession [id number]) (Order [id number]), US  BIOPSY (LIVER) (Accession [id number]) (Order [id number]), CT ABDOMEN W WO CONTRAST (Accession [id number]) (Order [id number]), US  Abdomen Limited RUQ (LIVER/GB) (Accession [id number]) (Order [id number]), US  LIVER DOPPLER (Accession [id number]) (Order [id number]), US  Abdomen Complete (Accession [id number]) (Order [id number])   Provider: Hays Morrison, CRNP  Provider contact ; [phone]

## 2023-12-07 ENCOUNTER — Other Ambulatory Visit: Payer: Self-pay | Admitting: Neurology

## 2023-12-07 DIAGNOSIS — R2681 Unsteadiness on feet: Secondary | ICD-10-CM | POA: Diagnosis not present

## 2023-12-07 DIAGNOSIS — G20B1 Parkinson's disease with dyskinesia, without mention of fluctuations: Secondary | ICD-10-CM

## 2023-12-07 DIAGNOSIS — R262 Difficulty in walking, not elsewhere classified: Secondary | ICD-10-CM | POA: Diagnosis not present

## 2023-12-07 DIAGNOSIS — G20A1 Parkinson's disease without dyskinesia, without mention of fluctuations: Secondary | ICD-10-CM

## 2023-12-07 DIAGNOSIS — R2689 Other abnormalities of gait and mobility: Secondary | ICD-10-CM | POA: Diagnosis not present

## 2023-12-08 ENCOUNTER — Ambulatory Visit (HOSPITAL_COMMUNITY)
Admission: RE | Admit: 2023-12-08 | Discharge: 2023-12-08 | Disposition: A | Discharge status: Home / Self Care | Source: Ambulatory Visit | Attending: Cardiology | Admitting: Cardiology

## 2023-12-08 ENCOUNTER — Other Ambulatory Visit: Payer: Self-pay | Admitting: Nurse Practitioner

## 2023-12-08 ENCOUNTER — Ambulatory Visit (HOSPITAL_COMMUNITY): Payer: Self-pay | Admitting: Cardiology

## 2023-12-08 DIAGNOSIS — D376 Neoplasm of uncertain behavior of liver, gallbladder and bile ducts: Secondary | ICD-10-CM

## 2023-12-08 DIAGNOSIS — I5032 Chronic diastolic (congestive) heart failure: Secondary | ICD-10-CM | POA: Diagnosis present

## 2023-12-08 DIAGNOSIS — M810 Age-related osteoporosis without current pathological fracture: Secondary | ICD-10-CM | POA: Diagnosis not present

## 2023-12-08 LAB — BASIC METABOLIC PANEL WITH GFR
Anion gap: 10 (ref 5–15)
BUN: 39 mg/dL — ABNORMAL HIGH (ref 8–23)
CO2: 28 mmol/L (ref 22–32)
Calcium: 9 mg/dL (ref 8.9–10.3)
Chloride: 97 mmol/L — ABNORMAL LOW (ref 98–111)
Creatinine, Ser: 1.33 mg/dL — ABNORMAL HIGH (ref 0.44–1.00)
GFR, Estimated: 40 mL/min — ABNORMAL LOW (ref >60)
Glucose, Bld: 108 mg/dL — ABNORMAL HIGH (ref 70–99)
Potassium: 4.1 mmol/L (ref 3.5–5.1)
Sodium: 135 mmol/L (ref 135–145)

## 2023-12-09 NOTE — Progress Notes (Unsigned)
 Karalee Wilkie POUR, MD sent to Carlie Clarita RAMAN Approved for US  core biopsy.  Please schedule with me.  - With sedation - See if we can get approval for US  contrast use if needed  HKM

## 2023-12-14 ENCOUNTER — Ambulatory Visit (INDEPENDENT_AMBULATORY_CARE_PROVIDER_SITE_OTHER): Admitting: Neurology

## 2023-12-14 ENCOUNTER — Ambulatory Visit: Payer: Self-pay | Admitting: Neurology

## 2023-12-14 DIAGNOSIS — R202 Paresthesia of skin: Secondary | ICD-10-CM | POA: Diagnosis not present

## 2023-12-14 NOTE — Procedures (Signed)
  [facility]  [address] [place], [address]  [place], [place] [address] Tel: [phone] Fax: [phone] Test Date:  12/14/2023  Patient: [name] [name] DOB: [birth year] Physician: Venetia Potters, MD  Sex: Female Height: 5' 1 Ref Phys: Asberry Schneider, DO  ID#: [id number]   Technician:    History: This is an 83 year old female with paresthesia of her feet.  NCV & EMG Findings: Study is limited to nerve conduction studies of bilateral lower limbs. Needle examination was not completed due to severity of abnormal movements and cramping experienced by patient. The findings include: Bilateral sural and superficial peroneal/fibular sensory responses are absent. Bilateral peroneal/fibular (EDB) and tibial (AH) motor responses are within normal limits Bilateral H reflex latencies are within normal limits.  Impression: This is an incomplete study without needle examination. 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    ___________________________ Venetia Potters, MD    Nerve Conduction Studies Motor Nerve Results    Latency Amplitude F-Lat Segment Distance CV Comment  Site (ms) Norm (mV) Norm (ms)  (cm) (m/s) Norm   Left Fibular (EDB) Motor  Ankle 3.4  < 6.0 3.2  > 2.5        Bel fib head 10.1 - 3.2 -  Bel fib head-Ankle 28 42  > 40   Pop fossa 12.0 - 2.9 -  Pop fossa-Bel fib head 9 47 -   Right Fibular (EDB) Motor  Ankle 3.2  < 6.0 5.4  > 2.5        Bel fib head 9.3 - 4.9 -  Bel fib head-Ankle 27.5 45  > 40   Pop fossa 11.0 - 4.9 -  Pop fossa-Bel fib head 9 53 -   Left Tibial (AH) Motor  Ankle 3.8  < 6.0 13.9  > 4.0        Knee 11.2 - 10.8 -  Knee-Ankle 36 49  > 40   Right Tibial (AH) Motor  Ankle 3.8  < 6.0 17.7  > 4.0        Knee 11.0 - 13.1 -  Knee-Ankle 38 53  > 40    Sensory Sites    Neg Peak Lat Amplitude (O-P) Segment Distance Velocity Comment  Site (ms) Norm (V) Norm   (cm) (ms)   Left Superficial Fibular Sensory  14 cm-Ankle *NR  < 4.6 *NR  > 3 14 cm-Ankle 14    Right Superficial Fibular Sensory  14 cm-Ankle *NR  < 4.6 *NR  > 3 14 cm-Ankle 14    Left Sural Sensory  Calf-Lat mall *NR  < 4.6 *NR  > 3 Calf-Lat mall 14    Right Sural Sensory  Calf-Lat mall *NR  < 4.6 *NR  > 3 Calf-Lat mall 12     H-Reflex Results    M-Lat H Lat H Neg Amp H-M Lat  Site (ms) (ms) Norm (mV) (ms)  Left Tibial H-Reflex  Pop fossa 6.7 34.4  < 35.0 0.58 27.7  Right Tibial H-Reflex  Pop fossa 7.1 34.2  < 35.0 1.44 27.1      Waveforms:  Motor           Sensory           H-Reflex

## 2023-12-15 ENCOUNTER — Telehealth: Payer: Self-pay | Admitting: Neurology

## 2023-12-15 NOTE — Telephone Encounter (Signed)
 See result notes.

## 2023-12-15 NOTE — Progress Notes (Signed)
 Patient advised.  Patient wanted to know is there any medication she can take help relive or reduce the cramps in her legs?

## 2023-12-15 NOTE — Telephone Encounter (Signed)
 Pt asking what she can take for cramping in legs.  Ask her when its happening in relationship to timing of rytary , or if its at night.  Is it when its close to next dose is due?

## 2023-12-15 NOTE — Progress Notes (Signed)
 LMOVM for patient to call the office back.

## 2023-12-15 NOTE — Telephone Encounter (Signed)
 Pt is returning call to discuss results from the EMG. Thanks

## 2023-12-16 DIAGNOSIS — R2689 Other abnormalities of gait and mobility: Secondary | ICD-10-CM | POA: Diagnosis not present

## 2023-12-16 DIAGNOSIS — R2681 Unsteadiness on feet: Secondary | ICD-10-CM | POA: Diagnosis not present

## 2023-12-16 DIAGNOSIS — R262 Difficulty in walking, not elsewhere classified: Secondary | ICD-10-CM | POA: Diagnosis not present

## 2023-12-21 DIAGNOSIS — R262 Difficulty in walking, not elsewhere classified: Secondary | ICD-10-CM | POA: Diagnosis not present

## 2023-12-21 DIAGNOSIS — R2689 Other abnormalities of gait and mobility: Secondary | ICD-10-CM | POA: Diagnosis not present

## 2023-12-21 DIAGNOSIS — R2681 Unsteadiness on feet: Secondary | ICD-10-CM | POA: Diagnosis not present

## 2023-12-23 ENCOUNTER — Other Ambulatory Visit (HOSPITAL_COMMUNITY): Payer: Self-pay | Admitting: Nurse Practitioner

## 2023-12-23 DIAGNOSIS — R2689 Other abnormalities of gait and mobility: Secondary | ICD-10-CM | POA: Diagnosis not present

## 2023-12-23 DIAGNOSIS — D376 Neoplasm of uncertain behavior of liver, gallbladder and bile ducts: Secondary | ICD-10-CM

## 2023-12-23 DIAGNOSIS — R262 Difficulty in walking, not elsewhere classified: Secondary | ICD-10-CM | POA: Diagnosis not present

## 2023-12-23 DIAGNOSIS — R2681 Unsteadiness on feet: Secondary | ICD-10-CM | POA: Diagnosis not present

## 2023-12-30 ENCOUNTER — Telehealth (HOSPITAL_BASED_OUTPATIENT_CLINIC_OR_DEPARTMENT_OTHER): Payer: Self-pay | Admitting: *Deleted

## 2023-12-30 NOTE — Telephone Encounter (Signed)
   Pre-operative Risk Assessment    Patient Name: [name] [name]  DOB: [birth year] MRN: [id number]   Date of last office visit: 11/18/23 JESSICA MILFORD, FNP Date of next office visit: NONE   Request for Surgical Clearance    Procedure:  US  GUIDED LIVER Bx   Date of Surgery:  Clearance TBD                                Surgeon:  DR. WILKIE Sessions  Surgeon's Group or Practice Name:  [facility]  Phone number:  [phone] Fax number:  [phone] JANET BATES   Type of Clearance Requested:   - Medical  - Pharmacy:  Hold Apixaban  (Eliquis ) x 48 HOURS PRIOR   Type of Anesthesia:  MODERATE SEDATION   Additional requests/questions:    [name]   12/30/2023, 5:10 PM

## 2023-12-30 NOTE — Progress Notes (Signed)
 [name] Wilkie POUR, MD sent to [name] [name] Approved for US  core biopsy.  Please schedule with me.  - With sedation - See if we can get approval for US  contrast use if needed  HKM       Previous Messages    ----- Message ----- From: [name] [name] Sent: 12/08/2023   9:31 AM EST To: Wilkie [name] Karalee, MD Subject: ATTN: Dr VEAR [name]   US  Biopsy              IR Approval Request:   Procedure:    US  guided Liver lesion biopsy  Reason:        liver lesion  History:         CT Abd w/wo contrast   11/12/2023  Provider:      Stephane Quest, CRNP  Provider Contact #   [facility] [place]  [phone]

## 2024-01-01 ENCOUNTER — Ambulatory Visit (HOSPITAL_COMMUNITY)
Admission: RE | Admit: 2024-01-01 | Discharge: 2024-01-01 | Disposition: A | Discharge status: Home / Self Care | Source: Ambulatory Visit | Attending: Nurse Practitioner | Admitting: Nurse Practitioner

## 2024-01-01 ENCOUNTER — Encounter: Payer: Self-pay | Admitting: Oncology

## 2024-01-01 DIAGNOSIS — D376 Neoplasm of uncertain behavior of liver, gallbladder and bile ducts: Secondary | ICD-10-CM | POA: Insufficient documentation

## 2024-01-01 DIAGNOSIS — R911 Solitary pulmonary nodule: Secondary | ICD-10-CM | POA: Diagnosis not present

## 2024-01-01 LAB — GLUCOSE, CAPILLARY: Glucose-Capillary: 109 mg/dL — ABNORMAL HIGH (ref 70–99)

## 2024-01-01 MED ORDER — FLUDEOXYGLUCOSE F - 18 (FDG) INJECTION
5.2900 | Freq: Once | INTRAVENOUS | Status: AC
  Administered 2024-01-01: 5.29 via INTRAVENOUS

## 2024-01-04 NOTE — Telephone Encounter (Signed)
 Harlene,  You saw this patient on 11/18/2023. Per protocol we request that you comment on his cardiac risk to proceed with US  GUIDED LIVER Bx  since it has been less than 2 months since evaluated in the office. Please send your comment to P CV Pre-Op Pool. I have reached out to pharmacy concerning Eliquis  hold.  Thank you, Lamarr Satterfield DNP, ANP, AACC.

## 2024-01-04 NOTE — Telephone Encounter (Signed)
 Pharmacy please advise on holding Eliquis  prior to Guided Liver biopsy, scheduled for TBD. Thank you.

## 2024-01-05 NOTE — Telephone Encounter (Signed)
 Patient with diagnosis of afib on Eliquis  for anticoagulation.    Procedure:  US  GUIDED LIVER Bx  Date of procedure: TBD   CHA2DS2-VASc Score = 5   This indicates a 7.2% annual risk of stroke. The patient's score is based upon: CHF History: 1 HTN History: 1 Diabetes History: 0 Stroke History: 0 Vascular Disease History: 0 Age Score: 2 Gender Score: 1      CrCl 25 ml/min Platelet count 211  Patient has not had an Afib/aflutter ablation in the last 3 months, DCCV within the last 4 weeks or a watchman implanted in the last 45 days   Per office protocol, patient can hold Eliquis  for 3 days prior to procedure.    **This guidance is not considered finalized until pre-operative APP has relayed final recommendations.**

## 2024-01-05 NOTE — Telephone Encounter (Signed)
   Patient Name: [name] [name]  DOB: 1940-05-22 MRN: [id number]  Primary Cardiologist: Aleene Passe, MD (Inactive)  Chart reviewed as part of pre-operative protocol coverage. Per Harlene Gainer, NP, who last saw the pt in clinic on 11/18/2023, given past medical history and time since last visit, based on ACC/AHA guidelines, [name] [name] is at acceptable risk for the planned procedure without further cardiovascular testing.   Per office protocol, patient can hold Eliquis  for 3 days prior to procedure.  Please resume Eliquis  as soon as possible postprocedure, at the discretion of the surgeon.   I will route this recommendation to the requesting party via Epic fax function and remove from pre-op pool.  Please call with questions.  Damien JAYSON Braver, NP 01/05/2024, 3:31 PM

## 2024-01-11 ENCOUNTER — Other Ambulatory Visit: Payer: Self-pay | Admitting: Radiology

## 2024-01-11 DIAGNOSIS — K769 Liver disease, unspecified: Secondary | ICD-10-CM

## 2024-01-13 ENCOUNTER — Other Ambulatory Visit (HOSPITAL_COMMUNITY): Payer: Self-pay | Admitting: Family Medicine

## 2024-01-14 ENCOUNTER — Ambulatory Visit: Payer: Medicare Other

## 2024-01-14 DIAGNOSIS — I5032 Chronic diastolic (congestive) heart failure: Secondary | ICD-10-CM

## 2024-01-15 ENCOUNTER — Ambulatory Visit (HOSPITAL_COMMUNITY)

## 2024-01-15 LAB — CUP PACEART REMOTE DEVICE CHECK
Battery Remaining Longevity: 100 mo
Battery Voltage: 3 V
Brady Statistic AP VP Percent: 88.36 %
Brady Statistic AP VS Percent: 4.77 %
Brady Statistic AS VP Percent: 4.64 %
Brady Statistic AS VS Percent: 2.24 %
Brady Statistic RA Percent Paced: 95.15 %
Brady Statistic RV Percent Paced: 93.02 %
Date Time Interrogation Session: 20251211005119
Implantable Lead Connection Status: 753985
Implantable Lead Connection Status: 753985
Implantable Lead Implant Date: 20140917
Implantable Lead Implant Date: 20140917
Implantable Lead Location: 753859
Implantable Lead Location: 753860
Implantable Pulse Generator Implant Date: 20230614
Lead Channel Impedance Value: 304 Ohm
Lead Channel Impedance Value: 342 Ohm
Lead Channel Impedance Value: 380 Ohm
Lead Channel Impedance Value: 399 Ohm
Lead Channel Pacing Threshold Amplitude: 0.75 V
Lead Channel Pacing Threshold Amplitude: 0.75 V
Lead Channel Pacing Threshold Pulse Width: 0.4 ms
Lead Channel Pacing Threshold Pulse Width: 0.4 ms
Lead Channel Sensing Intrinsic Amplitude: 0.125 mV
Lead Channel Sensing Intrinsic Amplitude: 0.125 mV
Lead Channel Sensing Intrinsic Amplitude: 12.875 mV
Lead Channel Sensing Intrinsic Amplitude: 12.875 mV
Lead Channel Setting Pacing Amplitude: 1.5 V
Lead Channel Setting Pacing Amplitude: 2 V
Lead Channel Setting Pacing Pulse Width: 0.4 ms
Lead Channel Setting Sensing Sensitivity: 1.2 mV
Zone Setting Status: 755011
Zone Setting Status: 755011

## 2024-01-21 DIAGNOSIS — H353211 Exudative age-related macular degeneration, right eye, with active choroidal neovascularization: Secondary | ICD-10-CM | POA: Diagnosis not present

## 2024-01-21 DIAGNOSIS — H353132 Nonexudative age-related macular degeneration, bilateral, intermediate dry stage: Secondary | ICD-10-CM | POA: Diagnosis not present

## 2024-01-21 DIAGNOSIS — H353221 Exudative age-related macular degeneration, left eye, with active choroidal neovascularization: Secondary | ICD-10-CM | POA: Diagnosis not present

## 2024-01-21 DIAGNOSIS — H35721 Serous detachment of retinal pigment epithelium, right eye: Secondary | ICD-10-CM | POA: Diagnosis not present

## 2024-01-21 DIAGNOSIS — H4052X3 Glaucoma secondary to other eye disorders, left eye, severe stage: Secondary | ICD-10-CM | POA: Diagnosis not present

## 2024-01-21 DIAGNOSIS — H43812 Vitreous degeneration, left eye: Secondary | ICD-10-CM | POA: Diagnosis not present

## 2024-01-21 NOTE — Progress Notes (Signed)
 Remote PPM Transmission

## 2024-02-02 ENCOUNTER — Other Ambulatory Visit (HOSPITAL_COMMUNITY): Payer: Self-pay | Admitting: Family Medicine

## 2024-02-02 ENCOUNTER — Ambulatory Visit: Payer: Self-pay | Admitting: Cardiology

## 2024-02-11 ENCOUNTER — Telehealth: Payer: Self-pay | Admitting: Neurology

## 2024-02-11 MED ORDER — TRAZODONE HCL 50 MG PO TABS: 50.0000 mg | ORAL_TABLET | Freq: Every day | ORAL | 0 refills | Status: AC

## 2024-02-11 NOTE — Telephone Encounter (Signed)
 [name] [name] called in stating that she is having a hard time trying to sleep. She stated that she feels sleepy, but is not able to get to sleep. She said it has been going on for a couple months.  PH: [phone]

## 2024-02-11 NOTE — Telephone Encounter (Signed)
 Tell her I sent a prescription for trazodone  to the pharmacy, but tell her she also needs to talk to her primary care physician about this, as it looks like he is giving her a sleep aid (Belsomra).  Also, the pharmacy may talk to her about a potential interaction between trazodone  and rasagiline .  I am not particularly worried about that, but I want her to know that I am aware

## 2024-02-12 NOTE — Telephone Encounter (Signed)
"  Called patient and left voicemail to call office back   "

## 2024-02-12 NOTE — Telephone Encounter (Signed)
 [name] LVM returning call from [place].   PH: [phone]

## 2024-02-16 ENCOUNTER — Inpatient Hospital Stay: Admitting: Hematology and Oncology

## 2024-02-16 ENCOUNTER — Encounter: Payer: Self-pay | Admitting: Hematology and Oncology

## 2024-02-16 ENCOUNTER — Telehealth: Payer: Self-pay | Admitting: Neurology

## 2024-02-16 ENCOUNTER — Encounter: Payer: Self-pay | Admitting: Oncology

## 2024-02-16 ENCOUNTER — Telehealth: Payer: Self-pay | Admitting: Pharmacy Technician

## 2024-02-16 ENCOUNTER — Inpatient Hospital Stay: Attending: Hematology and Oncology

## 2024-02-16 ENCOUNTER — Other Ambulatory Visit (HOSPITAL_COMMUNITY): Payer: Self-pay

## 2024-02-16 VITALS — BP 95/56 | HR 99 | Temp 98.1°F | Resp 16 | Wt 106.2 lb

## 2024-02-16 DIAGNOSIS — D649 Anemia, unspecified: Secondary | ICD-10-CM

## 2024-02-16 DIAGNOSIS — D509 Iron deficiency anemia, unspecified: Secondary | ICD-10-CM | POA: Insufficient documentation

## 2024-02-16 LAB — CBC WITH DIFFERENTIAL (CANCER CENTER ONLY)
Abs Immature Granulocytes: 0.03 K/uL (ref 0.00–0.07)
Basophils Absolute: 0.1 K/uL (ref 0.0–0.1)
Basophils Relative: 1 %
Eosinophils Absolute: 0.1 K/uL (ref 0.0–0.5)
Eosinophils Relative: 2 %
HCT: 26.7 % — ABNORMAL LOW (ref 36.0–46.0)
Hemoglobin: 7.9 g/dL — ABNORMAL LOW (ref 12.0–15.0)
Immature Granulocytes: 1 %
Lymphocytes Relative: 13 %
Lymphs Abs: 0.7 K/uL (ref 0.7–4.0)
MCH: 22.8 pg — ABNORMAL LOW (ref 26.0–34.0)
MCHC: 29.6 g/dL — ABNORMAL LOW (ref 30.0–36.0)
MCV: 76.9 fL — ABNORMAL LOW (ref 80.0–100.0)
Monocytes Absolute: 0.7 K/uL (ref 0.1–1.0)
Monocytes Relative: 13 %
Neutro Abs: 3.7 K/uL (ref 1.7–7.7)
Neutrophils Relative %: 70 %
Platelet Count: 220 K/uL (ref 150–400)
RBC: 3.47 MIL/uL — ABNORMAL LOW (ref 3.87–5.11)
RDW: 17.1 % — ABNORMAL HIGH (ref 11.5–15.5)
WBC Count: 5.3 K/uL (ref 4.0–10.5)
nRBC: 0 % (ref 0.0–0.2)

## 2024-02-16 LAB — CMP (CANCER CENTER ONLY)
ALT: <5 U/L (ref 0–44)
AST: 26 U/L (ref 15–41)
Albumin: 4.2 g/dL (ref 3.5–5.0)
Alkaline Phosphatase: 77 U/L (ref 38–126)
Anion gap: 10 (ref 5–15)
BUN: 32 mg/dL — ABNORMAL HIGH (ref 8–23)
CO2: 29 mmol/L (ref 22–32)
Calcium: 9.4 mg/dL (ref 8.9–10.3)
Chloride: 98 mmol/L (ref 98–111)
Creatinine: 1.32 mg/dL — ABNORMAL HIGH (ref 0.44–1.00)
GFR, Estimated: 40 mL/min — ABNORMAL LOW
Glucose, Bld: 85 mg/dL (ref 70–99)
Potassium: 4 mmol/L (ref 3.5–5.1)
Sodium: 137 mmol/L (ref 135–145)
Total Bilirubin: 1.3 mg/dL — ABNORMAL HIGH (ref 0.0–1.2)
Total Protein: 7.2 g/dL (ref 6.5–8.1)

## 2024-02-16 LAB — IRON AND IRON BINDING CAPACITY (CC-WL,HP ONLY)
Iron: 17 ug/dL — ABNORMAL LOW (ref 28–170)
Saturation Ratios: 4 % — ABNORMAL LOW (ref 10.4–31.8)
TIBC: 482 ug/dL — ABNORMAL HIGH (ref 250–450)
UIBC: 465 ug/dL

## 2024-02-16 LAB — SAMPLE TO BLOOD BANK

## 2024-02-16 LAB — FERRITIN: Ferritin: 20 ng/mL (ref 11–307)

## 2024-02-16 NOTE — Telephone Encounter (Signed)
 [name] [name] called in stating that her medication needs to be authorized, spoke with Pharmacist. Pharmacy stated that a request was sent to the office.   Regarding Rytary   PH: [phone]

## 2024-02-16 NOTE — Telephone Encounter (Signed)
 Called pateint and gave Dr. Martie recommendations and she understood. Still waiting on Rytary  PA

## 2024-02-16 NOTE — Telephone Encounter (Signed)
 PA has been submitted, and telephone encounter has been created. Please see telephone encounter dated 1.13.26.

## 2024-02-16 NOTE — Assessment & Plan Note (Signed)
 Lab review: 06/11/2021: Hemoglobin 10.3, MCV 88.2 02/21/2022: Hemoglobin 8.3, MCV 101, ferritin 88, creatinine 1.08, iron saturation 13%, TIBC 406 03/06/2022: Hemoglobin 8.7, MCV 96, RDW 15.9 SPEP: No M protein, LDH 232, folate 15.9, DAT negative, EPO 39.2 04/21/2022: Hemoglobin 10.8, MCV 89.3 05/27/2022: Hemoglobin 10.5 12/17/2022: Hemoglobin 9.1, MCV 78.5 (liver biopsy: Cirrhosis) 06/15/2023: Hemoglobin 9.9, MCV 88.5, iron saturation 10%, creatinine 1.19   Cord stem cell infusion November 2023    Anticoagulation Eliquis  dose recently reduced due to age and weight considerations. No reported adverse effects.   Since her blood counts have remained stable we decided not to do any further interventions. Patient is has Parkinson's and is concerned about worsening neuromotor effects. She and her husband both live at [facility].  Return to clinic in 6 months with labs and follow-up

## 2024-02-16 NOTE — Progress Notes (Signed)
 "  Patient Care Team: Shayne Anes, MD as PCP - General (Internal Medicine) Nahser, Aleene PARAS, MD (Inactive) as PCP - Cardiology (Cardiology) Inocencio Soyla Lunger, MD as PCP - Electrophysiology (Cardiology) Lajuana Jodie BROCKS, MD as Referring Physician (Cardiothoracic Surgery) Craig Norleen Mt, MD as Consulting Physician (Internal Medicine) Tat, Asberry RAMAN, DO as Consulting Physician (Neurology)  DIAGNOSIS:  Encounter Diagnosis  Name Primary?   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  She completed a four-week course of rituximab for marginal zone lymphoma about one and a half weeks ago. She feels her energy is totally back since finishing treatment, and there is no current evidence of active lymphoma. She denies abnormal bleeding or bruising.  She has cirrhosis with hepatic lesions that enlarged from 1.3 cm to 2.7 cm on interval imaging. Liver biopsy in November 2024 showed cirrhosis without malignancy. Despite lesion growth, PET and OPEX imaging have not identified a primary cancer, and the cause of the lesions is unclear. Repeat liver biopsy has been deferred due to procedural risk and frailty. She denies melena or hematochezia.  Her chronic anemia, with baseline hemoglobin around 10 g/dL, has worsened to 7.9 g/dL. She reports marked fatigue, decreased energy, and increased dyspnea with minimal exertion. She notes worsening balance and coordination and more dizziness, especially on waking. She feels wiped out and falls asleep quickly when reading. Family also reports profound fatigue. She noted some improvement in energy and ambulation after two nights of good sleep, which is unusual  for her. She has not received recent transfusions and is not taking iron.  She had a colonoscopy about 1.5 to 2 years ago that was unremarkable, and her gastroenterologist does not recommend repeat colonoscopy due to risk and lack of findings. She and her family are concerned about her ability to tolerate further invasive diagnostics or surgery.       ALLERGIES:  is allergic to amantadine , amiodarone , and statins.  MEDICATIONS:  Current Outpatient Medications  Medication Sig Dispense Refill   Acetylcysteine (NAC 600) 600 MG CAPS Take 600 mg by mouth in the morning.     AMBULATORY NON FORMULARY MEDICATION Rollator Dx: G20.B2 1 Device 0   apixaban  (ELIQUIS ) 2.5 MG TABS tablet Take 1 tablet (2.5 mg total) by mouth 2 (two) times daily. 60 tablet 11   B-D 3CC LUER-LOK SYR 25GX1 25G X 1 3 ML MISC Inject 1 mL into the muscle once a week.     BELSOMRA 20 MG TABS Take 20 mg by mouth at bedtime as needed (sleep).     Calcium  Citrate-Vitamin D  (CITRACAL + D PO) Take 1 tablet by mouth every evening.     Carbidopa -Levodopa  ER (RYTARY ) 36.25-145 MG CPCR Take 2 tablets by mouth 4 (four) times daily. 720 capsule 1   cholecalciferol (VITAMIN D3) 25 MCG (1000 UNIT) tablet Take 1,000 Units by mouth in the morning.     cyanocobalamin  (,VITAMIN B-12,) 1000 MCG/ML injection INJECT 1ML INTRAMUSCULARLY IN THE DELTOID ONCE A MONTH (ALTERNATING DELT. EACH MONTH)     Cyanocobalamin  (VITAMIN B12) 1000 MCG TBCR Take 1,000 mcg by mouth in the morning.     docusate sodium  (COLACE) 100 MG capsule Take 100 mg by mouth 2 (two) times daily.  GLUTATHIONE PO Take 2 capsules by mouth in the morning.     levothyroxine  (SYNTHROID ) 50 MCG tablet Take 50-100 mcg by mouth See admin instructions. Take 2 tablets (100 mcg) by mouth on Mondays and Thursdays in the morning. Take 1 tablet (50 mcg) by mouth on Sundays, Tuesdays, Wednesdays, Fridays & Saturdays.     magnesium oxide (MAG-OX) 400 MG tablet Take 400 mg by mouth at  bedtime.     metoprolol  succinate (TOPROL -XL) 25 MG 24 hr tablet TAKE ONE TABLET BY MOUTH AT BEDTIME 30 tablet 6   NON FORMULARY Take 1 tablet by mouth at bedtime. MetaCalm Stress Regulation Formula (Patient not taking: Reported on 11/18/2023)     pantoprazole (PROTONIX) 40 MG tablet Take 40 mg by mouth 2 (two) times daily.     polyethylene glycol (MIRALAX ) 17 g packet Take by mouth.     potassium chloride  (MICRO-K ) 10 MEQ CR capsule TAKE FOUR CAPSULES BY MOUTH DAILY 112 capsule 0   rasagiline  (AZILECT ) 1 MG TABS tablet TAKE ONE TABLET BY MOUTH EVERY DAY 90 tablet 0   spironolactone  (ALDACTONE ) 25 MG tablet Take 2 tablets (50 mg total) by mouth daily. 180 tablet 3   torsemide  (DEMADEX ) 20 MG tablet Take 4 tablets (80 mg total) by mouth every morning AND 3 tablets (60 mg total) every evening. 300 tablet 3   traZODone  (DESYREL ) 50 MG tablet Take 1 tablet (50 mg total) by mouth at bedtime. 90 tablet 0   vitamin C (ASCORBIC ACID) 500 MG tablet Take 500 mg by mouth in the morning.     No current facility-administered medications for this visit.   Facility-Administered Medications Ordered in Other Visits  Medication Dose Route Frequency Provider Last Rate Last Admin   sodium chloride  flush (NS) 0.9 % injection 10 mL  10 mL Intravenous Q12H Christine Males, PA-C        PHYSICAL EXAMINATION: ECOG PERFORMANCE STATUS: 1 - Symptomatic but completely ambulatory  Vitals:   02/16/24 1036  BP: (!) 95/56  Pulse: 99  Resp: 16  Temp: 98.1 F (36.7 C)  SpO2: 94%   Filed Weights   02/16/24 1036  Weight: 106 lb 3.2 oz (48.2 kg)      LABORATORY DATA:  I have reviewed the data as listed    Latest Ref Rng & Units 02/16/2024   10:03 AM 12/08/2023    1:47 PM 11/18/2023    2:09 PM  CMP  Glucose 70 - 99 mg/dL 85  891  866   BUN 8 - 23 mg/dL 32  39  24   Creatinine 0.44 - 1.00 mg/dL 8.67  8.66  8.94   Sodium 135 - 145 mmol/L 137  135  134   Potassium 3.5 - 5.1 mmol/L 4.0  4.1  3.2   Chloride 98 -  111 mmol/L 98  97  95   CO2 22 - 32 mmol/L 29  28  29    Calcium  8.9 - 10.3 mg/dL 9.4  9.0  8.7   Total Protein 6.5 - 8.1 g/dL 7.2     Total Bilirubin 0.0 - 1.2 mg/dL 1.3     Alkaline Phos 38 - 126 U/L 77     AST 15 - 41 U/L 26     ALT 0 - 44 U/L <5       Lab Results  Component Value Date   WBC 5.3 02/16/2024   HGB 7.9 (L) 02/16/2024   HCT 26.7 (L) 02/16/2024   MCV 76.9 (L) 02/16/2024  PLT 220 02/16/2024   NEUTROABS 3.7 02/16/2024    ASSESSMENT & PLAN:  Normocytic anemia Lab review: 06/11/2021: Hemoglobin 10.3, MCV 88.2 02/21/2022: Hemoglobin 8.3, MCV 101, ferritin 88, creatinine 1.08, iron saturation 13%, TIBC 406 03/06/2022: Hemoglobin 8.7, MCV 96, RDW 15.9 SPEP: No M protein, LDH 232, folate 15.9, DAT negative, EPO 39.2 04/21/2022: Hemoglobin 10.8, MCV 89.3 05/27/2022: Hemoglobin 10.5 12/17/2022: Hemoglobin 9.1, MCV 78.5 (liver biopsy: Cirrhosis) 06/15/2023: Hemoglobin 9.9, MCV 88.5, iron saturation 10%, creatinine 1.19 02/16/2024: Hemoglobin 7.9, MCV 76.9, creatinine 1.32, iron saturation 4%   Cord stem cell infusion November 2023    Recommendation: Microcytic anemia new onset: Will request GI to discuss with her about colonoscopy. 2.  IV iron therapy We decided to hold off on blood transfusion.  Liver lesions: PET CT scan 01/01/2024: Nontracer avid enlarging liver lesions concerning for hepatocellular carcinoma or metastatic disease.  Patient tells me that she does not wish to pursue any and therefore biopsy has not been done.  Patient has Parkinson's and is concerned about worsening neuromotor effects. She and her husband both live at [facility].   Return to clinic in 3 months with labs and follow-up ------------------------------------- Assessment and Plan Assessment & Plan Cirrhosis of liver Cirrhosis confirmed by biopsy. Liver lesions growing on imaging without definitive malignancy. Further invasive diagnostics limited by frailty and procedural risk. High surgical  risk due to comorbidities. - Discussed biopsy risks with her and family; deferred due to frailty. - Awaiting gastroenterology input for further diagnostics or interventions.      No orders of the defined types were placed in this encounter.  The patient has a good understanding of the overall plan. she agrees with it. she will call with any problems that may develop before the next visit here.  I personally spent a total of 30 minutes in the care of the patient today including preparing to see the patient, getting/reviewing separately obtained history, performing a medically appropriate exam/evaluation, counseling and educating, placing orders, referring and communicating with other health care professionals, documenting clinical information in the EHR, independently interpreting results, communicating results, and coordinating care.   Viinay K Cheveyo [place], MD 02/16/2024    "

## 2024-02-16 NOTE — Telephone Encounter (Signed)
 Pharmacy Patient Advocate Encounter   Received notification from Pt Calls Messages that prior authorization for RYTARY  145MG  is required/requested.   Insurance verification completed.   The patient is insured through [facility].   Per test claim: PA required; PA submitted to above mentioned insurance via Latent Key/confirmation #/EOC [facility] Status is pending

## 2024-02-16 NOTE — Telephone Encounter (Signed)
 Dr. Gudena, the patient will be scheduled as soon as possible  Auth Submission: NO AUTH NEEDED Site of care: Site of care: [facility] Payer: Medicare A/B Medication & CPT/J Code(s) submitted: Feraheme  (ferumoxytol ) Q0138 Diagnosis Code: D50.9 Route of submission (phone, fax, portal): n/a Phone # Fax # Auth type: Buy/Bill PB Units/visits requested: 510 mg x 2 Reference number: n/a Approval from: 02/16/2024 to  06/02/2024

## 2024-02-17 ENCOUNTER — Telehealth: Payer: Self-pay | Admitting: Neurology

## 2024-02-17 NOTE — Telephone Encounter (Signed)
 Pt states she needs to know if she can go ahead and start taking trazadone or does she need to ask her other provider

## 2024-02-19 ENCOUNTER — Encounter: Payer: Self-pay | Admitting: Oncology

## 2024-02-19 MED FILL — Ferumoxytol Inj 510 MG/17ML (30 MG/ML) (Elemental Fe): INTRAVENOUS | Qty: 17 | Status: AC

## 2024-02-20 ENCOUNTER — Inpatient Hospital Stay

## 2024-02-20 VITALS — BP 101/60 | HR 73 | Temp 97.4°F | Resp 20

## 2024-02-20 DIAGNOSIS — D649 Anemia, unspecified: Secondary | ICD-10-CM

## 2024-02-20 DIAGNOSIS — D509 Iron deficiency anemia, unspecified: Secondary | ICD-10-CM | POA: Diagnosis not present

## 2024-02-20 MED ORDER — SODIUM CHLORIDE 0.9% FLUSH: 10.0000 mL | Freq: Two times a day (BID) | INTRAVENOUS | Status: DC

## 2024-02-20 MED ORDER — SODIUM CHLORIDE 0.9 % IV SOLN
510.0000 mg | Freq: Once | INTRAVENOUS | Status: AC
  Administered 2024-02-20: 510 mg via INTRAVENOUS
  Filled 2024-02-20: qty 510

## 2024-02-20 NOTE — Progress Notes (Signed)
 Patient tolerated iron infusion well. Observed 30 30 minutes post Iron infusion, vitals WNL.

## 2024-02-25 ENCOUNTER — Inpatient Hospital Stay

## 2024-02-26 ENCOUNTER — Inpatient Hospital Stay

## 2024-02-26 VITALS — BP 109/55 | HR 97 | Temp 97.9°F | Resp 18

## 2024-02-26 DIAGNOSIS — D649 Anemia, unspecified: Secondary | ICD-10-CM

## 2024-02-26 DIAGNOSIS — D509 Iron deficiency anemia, unspecified: Secondary | ICD-10-CM | POA: Diagnosis not present

## 2024-02-26 MED ORDER — SODIUM CHLORIDE 0.9% FLUSH
10.0000 mL | Freq: Two times a day (BID) | INTRAVENOUS | Status: DC
  Administered 2024-02-26: 10 mL via INTRAVENOUS

## 2024-02-26 MED ORDER — SODIUM CHLORIDE 0.9 % IV SOLN
510.0000 mg | Freq: Once | INTRAVENOUS | Status: AC
  Administered 2024-02-26: 510 mg via INTRAVENOUS
  Filled 2024-02-26: qty 510

## 2024-02-26 NOTE — Patient Instructions (Addendum)
 Iron Deficiency Anemia, Adult  Iron deficiency anemia is a condition in which the concentration of red blood cells or hemoglobin in the blood is below normal because of too little iron. Hemoglobin is a substance in red blood cells that carries oxygen  to the body's tissues. When the concentration of red blood cells or hemoglobin is too low, not enough oxygen  reaches these tissues. Iron deficiency anemia is usually long-lasting, and it develops over time. It may or may not cause symptoms. It is a common type of anemia. What are the causes? This condition may be caused by: Not enough iron in the diet. Abnormal absorption in the gut. Blood loss. What increases the risk? You are more likely to develop this condition if you get menstrual periods (menstruate) or are pregnant. What are the signs or symptoms? Symptoms of this condition may include: Pale skin, lips, and nail beds. Weakness, dizziness, and getting tired easily. Shortness of breath when moving or exercising. Cold hands or feet. Mild anemia may not cause any symptoms. How is this diagnosed? This condition is diagnosed based on: Your medical history. A physical exam. Blood tests. How is this treated? This condition is treated by correcting the cause of your iron deficiency. Treatment may involve: Adding iron-rich foods to your diet. Taking iron supplements. If you are pregnant or breastfeeding, you may need to take extra iron because your normal diet usually does not provide the amount of iron that you need. Increasing vitamin C intake. Vitamin C helps your body absorb iron. Your health care provider may recommend that you take iron supplements along with a glass of orange juice or a vitamin C supplement. Medicines to make heavy menstrual flow lighter. Surgery or additional testing procedures to determine the cause of your anemia. You may need repeat blood tests to determine whether treatment is working. If the treatment does not  seem to be working, you may need more tests. Follow these instructions at home: Medicines Take over-the-counter and prescription medicines only as told by your health care provider. This includes iron supplements and vitamins. This is important because too much iron can be harmful. For the best iron absorption, you should take iron supplements when your stomach is empty. If you cannot tolerate them on an empty stomach, you may need to take them with food. Do not drink milk or take antacids at the same time as your iron supplements. Milk and antacids may interfere with how your body absorbs iron. Iron supplements may turn stool (feces) a darker color and it may appear black. If you cannot tolerate taking iron supplements by mouth, talk with your health care provider about taking them through an IV or through an injection into a muscle. Eating and drinking Talk with your health care provider before changing your diet. Your provider may recommend that you eat foods that contain a lot of iron, such as: Liver. Low-fat (lean) beef. Breads and cereals that have iron added to them (are fortified). Eggs. Dried fruit. Dark green, leafy vegetables. To help your body use the iron from iron-rich foods, eat those foods at the same time as fresh fruits and vegetables that are high in vitamin C. Foods that are high in vitamin C include: Oranges. Peppers. Tomatoes. Mangoes. Managing constipation If you are taking an iron supplement, it may cause constipation. To prevent or treat constipation, you may need to: Drink enough fluid to keep your urine pale yellow. Take over-the-counter or prescription medicines. Eat foods that are high in fiber, such  as beans, whole grains, and fresh fruits and vegetables. Limit foods that are high in fat and processed sugars, such as fried or sweet foods. General instructions Return to your normal activities as told by your health care provider. Ask your health care provider  what activities are safe for you. Keep all follow-up visits. Contact a health care provider if: You feel nauseous or you vomit. You feel weak. You become light-headed when getting up from a sitting or lying down position. You have unexplained sweating. You develop symptoms of constipation. You have a heaviness in your chest. You have trouble breathing with physical activity. Get help right away if: You faint. If this happens, do not drive yourself to the hospital. You have an irregular or rapid heartbeat. Summary Iron deficiency anemia is a condition in which the concentration of red blood cells or hemoglobin in the blood is below normal because of too little iron. This condition is treated by correcting the cause of your iron deficiency. Take over-the-counter and prescription medicines only as told by your health care provider. This includes iron supplements and vitamins. To help your body use the iron from iron-rich foods, eat those foods at the same time as fresh fruits and vegetables that are high in vitamin C. Seek medical help if you have signs or symptoms of worsening anemia. This information is not intended to replace advice given to you by your health care provider. Make sure you discuss any questions you have with your health care provider. Document Revised: 02/27/2021 Document Reviewed: 02/27/2021 Elsevier Patient Education  2024 [employer]  Ferumoxytol  Injection What is this medication? FERUMOXYTOL  (FER ue MOX i tol) treats low levels of iron in your body (iron deficiency anemia). Iron is a mineral that plays an important role in making red blood cells, which carry oxygen  from your lungs to the rest of your body. This medicine may be used for other purposes; ask your health care provider or pharmacist if you have questions. COMMON BRAND NAME(S): Feraheme  What should I tell my care team before I take this medication? They need to know if you have any of these  conditions: Anemia not caused by low iron levels High levels of iron in the blood Magnetic resonance imaging (MRI) test scheduled An unusual or allergic reaction to iron, other medications, foods, dyes, or preservatives Pregnant or trying to get pregnant Breastfeeding How should I use this medication? This medication is injected into a vein. It is given by your care team in a hospital or clinic setting. Talk to your care team the use of this medication in children. Special care may be needed. Overdosage: If you think you have taken too much of this medicine contact a poison control center or emergency room at once. NOTE: This medicine is only for you. Do not share this medicine with others. What if I miss a dose? It is important not to miss your dose. Call your care team if you are unable to keep an appointment. What may interact with this medication? Other iron products This list may not describe all possible interactions. Give your health care provider a list of all the medicines, herbs, non-prescription drugs, or dietary supplements you use. Also tell them if you smoke, drink alcohol, or use illegal drugs. Some items may interact with your medicine. What should I watch for while using this medication? Visit your care team regularly. Tell your care team if your symptoms do not start to get better or if they get worse.  You may need blood work done while you are taking this medication. You may need to follow a special diet. Talk to your care team. Foods that contain iron include: whole grains/cereals, dried fruits, beans, or peas, leafy green vegetables, and organ meats (liver, kidney). What side effects may I notice from receiving this medication? Side effects that you should report to your care team as soon as possible: Allergic reactions--skin rash, itching, hives, swelling of the face, lips, tongue, or throat Low blood pressure--dizziness, feeling faint or lightheaded, blurry  vision Shortness of breath Side effects that usually do not require medical attention (report to your care team if they continue or are bothersome): Flushing Headache Joint pain Muscle pain Nausea Pain, redness, or irritation at injection site This list may not describe all possible side effects. Call your doctor for medical advice about side effects. You may report side effects to FDA at 1-800-FDA-1088. Where should I keep my medication? This medication is given in a hospital or clinic. It will not be stored at home. NOTE: This sheet is a summary. It may not cover all possible information. If you have questions about this medicine, talk to your doctor, pharmacist, or health care provider.  2024 Elsevier/Gold Standard (2022-06-27 00:00:00)

## 2024-03-01 ENCOUNTER — Other Ambulatory Visit: Payer: Self-pay | Admitting: Neurology

## 2024-03-01 ENCOUNTER — Other Ambulatory Visit (HOSPITAL_COMMUNITY): Payer: Self-pay | Admitting: Family Medicine

## 2024-03-01 DIAGNOSIS — G20A1 Parkinson's disease without dyskinesia, without mention of fluctuations: Secondary | ICD-10-CM

## 2024-03-01 DIAGNOSIS — G20B1 Parkinson's disease with dyskinesia, without mention of fluctuations: Secondary | ICD-10-CM

## 2024-03-01 NOTE — Telephone Encounter (Signed)
 Pharmacy Patient Advocate Encounter  Received notification from [facility] Medicare that Prior Authorization for Rytary  36.25-145MG  er capsules has been APPROVED from 02-04-2024 to 02-15-2025   PA #/Case ID/Reference #: [id number]

## 2024-03-17 ENCOUNTER — Ambulatory Visit (HOSPITAL_COMMUNITY): Admission: RE | Admit: 2024-03-17 | Admitting: Cardiology

## 2024-03-22 ENCOUNTER — Inpatient Hospital Stay: Admitting: Hematology and Oncology

## 2024-03-22 ENCOUNTER — Inpatient Hospital Stay: Attending: Hematology and Oncology

## 2024-04-12 ENCOUNTER — Ambulatory Visit: Admitting: Neurology

## 2024-04-14 ENCOUNTER — Ambulatory Visit

## 2024-07-14 ENCOUNTER — Ambulatory Visit

## 2024-10-13 ENCOUNTER — Ambulatory Visit

## 2025-01-12 ENCOUNTER — Ambulatory Visit

## 2025-04-13 ENCOUNTER — Ambulatory Visit
